# Patient Record
Sex: Female | Born: 1945 | Race: White | Hispanic: No | Marital: Single | Smoking: Former smoker
Health system: Southern US, Community
[De-identification: ages and names within clinical notes are randomized; demographics above are authoritative.]

## PROBLEM LIST (undated history)

## (undated) DIAGNOSIS — E042 Nontoxic multinodular goiter: Secondary | ICD-10-CM

## (undated) DIAGNOSIS — C801 Malignant (primary) neoplasm, unspecified: Secondary | ICD-10-CM

## (undated) DIAGNOSIS — J449 Chronic obstructive pulmonary disease, unspecified: Secondary | ICD-10-CM

## (undated) DIAGNOSIS — T148XXA Other injury of unspecified body region, initial encounter: Secondary | ICD-10-CM

## (undated) DIAGNOSIS — M1712 Unilateral primary osteoarthritis, left knee: Secondary | ICD-10-CM

## (undated) DIAGNOSIS — M1990 Unspecified osteoarthritis, unspecified site: Secondary | ICD-10-CM

## (undated) DIAGNOSIS — K219 Gastro-esophageal reflux disease without esophagitis: Secondary | ICD-10-CM

## (undated) DIAGNOSIS — C50912 Malignant neoplasm of unspecified site of left female breast: Secondary | ICD-10-CM

## (undated) DIAGNOSIS — E0590 Thyrotoxicosis, unspecified without thyrotoxic crisis or storm: Secondary | ICD-10-CM

## (undated) DIAGNOSIS — E785 Hyperlipidemia, unspecified: Secondary | ICD-10-CM

## (undated) DIAGNOSIS — Z800 Family history of malignant neoplasm of digestive organs: Secondary | ICD-10-CM

## (undated) DIAGNOSIS — R112 Nausea with vomiting, unspecified: Secondary | ICD-10-CM

## (undated) DIAGNOSIS — M7918 Myalgia, other site: Secondary | ICD-10-CM

## (undated) DIAGNOSIS — K222 Esophageal obstruction: Secondary | ICD-10-CM

## (undated) DIAGNOSIS — M810 Age-related osteoporosis without current pathological fracture: Secondary | ICD-10-CM

## (undated) DIAGNOSIS — Z9889 Other specified postprocedural states: Secondary | ICD-10-CM

## (undated) DIAGNOSIS — L309 Dermatitis, unspecified: Secondary | ICD-10-CM

## (undated) DIAGNOSIS — Z86010 Personal history of colonic polyps: Secondary | ICD-10-CM

## (undated) DIAGNOSIS — Z8719 Personal history of other diseases of the digestive system: Secondary | ICD-10-CM

## (undated) DIAGNOSIS — J479 Bronchiectasis, uncomplicated: Secondary | ICD-10-CM

## (undated) DIAGNOSIS — M8580 Other specified disorders of bone density and structure, unspecified site: Secondary | ICD-10-CM

## (undated) DIAGNOSIS — L42 Pityriasis rosea: Secondary | ICD-10-CM

## (undated) DIAGNOSIS — G4700 Insomnia, unspecified: Secondary | ICD-10-CM

## (undated) DIAGNOSIS — H269 Unspecified cataract: Secondary | ICD-10-CM

## (undated) DIAGNOSIS — K449 Diaphragmatic hernia without obstruction or gangrene: Secondary | ICD-10-CM

## (undated) DIAGNOSIS — Z803 Family history of malignant neoplasm of breast: Secondary | ICD-10-CM

## (undated) DIAGNOSIS — Z923 Personal history of irradiation: Secondary | ICD-10-CM

## (undated) DIAGNOSIS — Z973 Presence of spectacles and contact lenses: Secondary | ICD-10-CM

## (undated) DIAGNOSIS — Z8639 Personal history of other endocrine, nutritional and metabolic disease: Secondary | ICD-10-CM

## (undated) HISTORY — DX: Other specified disorders of bone density and structure, unspecified site: M85.80

## (undated) HISTORY — DX: Myalgia, other site: M79.18

## (undated) HISTORY — DX: Personal history of colonic polyps: Z86.010

## (undated) HISTORY — DX: Thyrotoxicosis, unspecified without thyrotoxic crisis or storm: E05.90

## (undated) HISTORY — DX: Unspecified cataract: H26.9

## (undated) HISTORY — DX: Other injury of unspecified body region, initial encounter: T14.8XXA

## (undated) HISTORY — DX: Family history of malignant neoplasm of breast: Z80.3

## (undated) HISTORY — DX: Gastro-esophageal reflux disease without esophagitis: K21.9

## (undated) HISTORY — DX: Pityriasis rosea: L42

## (undated) HISTORY — PX: TUBAL LIGATION: SHX77

## (undated) HISTORY — DX: Unspecified osteoarthritis, unspecified site: M19.90

## (undated) HISTORY — DX: Esophageal obstruction: K22.2

## (undated) HISTORY — DX: Bronchiectasis, uncomplicated: J47.9

## (undated) HISTORY — DX: Age-related osteoporosis without current pathological fracture: M81.0

## (undated) HISTORY — PX: TONSILLECTOMY: SUR1361

## (undated) HISTORY — DX: Unilateral primary osteoarthritis, left knee: M17.12

## (undated) HISTORY — DX: Family history of malignant neoplasm of digestive organs: Z80.0

## (undated) HISTORY — DX: Malignant (primary) neoplasm, unspecified: C80.1

## (undated) HISTORY — DX: Diaphragmatic hernia without obstruction or gangrene: K44.9

---

## 1989-10-30 HISTORY — PX: VAGINAL HYSTERECTOMY: SUR661

## 1998-07-02 ENCOUNTER — Other Ambulatory Visit: Admission: RE | Admit: 1998-07-02 | Discharge: 1998-07-02 | Payer: Self-pay | Admitting: Obstetrics and Gynecology

## 1998-10-30 HISTORY — PX: BREAST SURGERY: SHX581

## 1998-11-05 ENCOUNTER — Ambulatory Visit (HOSPITAL_BASED_OUTPATIENT_CLINIC_OR_DEPARTMENT_OTHER): Admission: RE | Admit: 1998-11-05 | Discharge: 1998-11-05 | Payer: Self-pay | Admitting: Surgery

## 1999-07-06 ENCOUNTER — Other Ambulatory Visit: Admission: RE | Admit: 1999-07-06 | Discharge: 1999-07-06 | Payer: Self-pay | Admitting: Obstetrics and Gynecology

## 1999-07-06 ENCOUNTER — Encounter (INDEPENDENT_AMBULATORY_CARE_PROVIDER_SITE_OTHER): Payer: Self-pay

## 2000-07-05 ENCOUNTER — Other Ambulatory Visit: Admission: RE | Admit: 2000-07-05 | Discharge: 2000-07-05 | Payer: Self-pay | Admitting: Obstetrics and Gynecology

## 2001-07-11 ENCOUNTER — Other Ambulatory Visit: Admission: RE | Admit: 2001-07-11 | Discharge: 2001-07-11 | Payer: Self-pay | Admitting: Obstetrics and Gynecology

## 2001-09-06 ENCOUNTER — Ambulatory Visit (HOSPITAL_COMMUNITY): Admission: RE | Admit: 2001-09-06 | Discharge: 2001-09-06 | Payer: Self-pay | Admitting: Gastroenterology

## 2001-09-06 ENCOUNTER — Encounter (INDEPENDENT_AMBULATORY_CARE_PROVIDER_SITE_OTHER): Payer: Self-pay | Admitting: *Deleted

## 2001-09-06 DIAGNOSIS — Z86010 Personal history of colonic polyps: Secondary | ICD-10-CM

## 2001-09-06 HISTORY — DX: Personal history of colonic polyps: Z86.010

## 2002-07-17 ENCOUNTER — Other Ambulatory Visit: Admission: RE | Admit: 2002-07-17 | Discharge: 2002-07-17 | Payer: Self-pay | Admitting: Obstetrics and Gynecology

## 2003-07-24 ENCOUNTER — Other Ambulatory Visit: Admission: RE | Admit: 2003-07-24 | Discharge: 2003-07-24 | Payer: Self-pay | Admitting: Obstetrics and Gynecology

## 2003-09-08 ENCOUNTER — Encounter: Admission: RE | Admit: 2003-09-08 | Discharge: 2003-09-08 | Payer: Self-pay | Admitting: Internal Medicine

## 2004-03-29 ENCOUNTER — Encounter: Admission: RE | Admit: 2004-03-29 | Discharge: 2004-03-29 | Payer: Self-pay | Admitting: Internal Medicine

## 2004-08-01 ENCOUNTER — Other Ambulatory Visit: Admission: RE | Admit: 2004-08-01 | Discharge: 2004-08-01 | Payer: Self-pay | Admitting: Obstetrics and Gynecology

## 2004-08-31 ENCOUNTER — Ambulatory Visit: Payer: Self-pay | Admitting: Gastroenterology

## 2004-09-16 ENCOUNTER — Ambulatory Visit: Payer: Self-pay | Admitting: Gastroenterology

## 2004-10-06 ENCOUNTER — Ambulatory Visit: Payer: Self-pay | Admitting: Internal Medicine

## 2004-10-07 ENCOUNTER — Encounter: Admission: RE | Admit: 2004-10-07 | Discharge: 2004-10-07 | Payer: Self-pay | Admitting: Internal Medicine

## 2004-10-11 ENCOUNTER — Ambulatory Visit: Payer: Self-pay | Admitting: Internal Medicine

## 2005-04-14 ENCOUNTER — Ambulatory Visit: Payer: Self-pay | Admitting: Gastroenterology

## 2005-08-04 ENCOUNTER — Other Ambulatory Visit: Admission: RE | Admit: 2005-08-04 | Discharge: 2005-08-04 | Payer: Self-pay | Admitting: Obstetrics and Gynecology

## 2005-12-15 ENCOUNTER — Ambulatory Visit: Payer: Self-pay | Admitting: Internal Medicine

## 2006-03-21 ENCOUNTER — Ambulatory Visit: Payer: Self-pay | Admitting: Internal Medicine

## 2006-03-23 ENCOUNTER — Ambulatory Visit: Payer: Self-pay | Admitting: Internal Medicine

## 2006-05-15 ENCOUNTER — Ambulatory Visit: Payer: Self-pay | Admitting: Gastroenterology

## 2006-07-30 DIAGNOSIS — K219 Gastro-esophageal reflux disease without esophagitis: Secondary | ICD-10-CM

## 2006-07-30 HISTORY — DX: Gastro-esophageal reflux disease without esophagitis: K21.9

## 2006-08-08 ENCOUNTER — Other Ambulatory Visit: Admission: RE | Admit: 2006-08-08 | Discharge: 2006-08-08 | Payer: Self-pay | Admitting: Obstetrics and Gynecology

## 2006-08-14 ENCOUNTER — Ambulatory Visit: Payer: Self-pay | Admitting: Gastroenterology

## 2006-08-15 ENCOUNTER — Encounter (INDEPENDENT_AMBULATORY_CARE_PROVIDER_SITE_OTHER): Payer: Self-pay | Admitting: Specialist

## 2006-08-15 ENCOUNTER — Ambulatory Visit: Payer: Self-pay | Admitting: Gastroenterology

## 2006-08-15 DIAGNOSIS — K449 Diaphragmatic hernia without obstruction or gangrene: Secondary | ICD-10-CM

## 2006-08-15 HISTORY — DX: Diaphragmatic hernia without obstruction or gangrene: K44.9

## 2006-08-21 ENCOUNTER — Ambulatory Visit: Payer: Self-pay | Admitting: Gastroenterology

## 2006-09-14 ENCOUNTER — Ambulatory Visit: Payer: Self-pay | Admitting: Gastroenterology

## 2007-04-05 ENCOUNTER — Ambulatory Visit: Payer: Self-pay | Admitting: Internal Medicine

## 2007-04-05 DIAGNOSIS — R51 Headache: Secondary | ICD-10-CM | POA: Insufficient documentation

## 2007-04-05 DIAGNOSIS — K222 Esophageal obstruction: Secondary | ICD-10-CM

## 2007-04-05 DIAGNOSIS — R519 Headache, unspecified: Secondary | ICD-10-CM | POA: Insufficient documentation

## 2007-04-05 HISTORY — DX: Esophageal obstruction: K22.2

## 2007-04-08 ENCOUNTER — Ambulatory Visit: Payer: Self-pay | Admitting: Internal Medicine

## 2007-04-08 LAB — CONVERTED CEMR LAB
BUN: 13 mg/dL (ref 6–23)
Basophils Absolute: 0 10*3/uL (ref 0.0–0.1)
Basophils Relative: 0.6 % (ref 0.0–1.0)
CO2: 29 meq/L (ref 19–32)
Calcium: 9.7 mg/dL (ref 8.4–10.5)
Chloride: 104 meq/L (ref 96–112)
Creatinine, Ser: 1 mg/dL (ref 0.4–1.2)
Eosinophils Absolute: 0.1 10*3/uL (ref 0.0–0.6)
Eosinophils Relative: 1.5 % (ref 0.0–5.0)
GFR calc Af Amer: 73 mL/min
GFR calc non Af Amer: 60 mL/min
Glucose, Bld: 102 mg/dL — ABNORMAL HIGH (ref 70–99)
HCT: 34.6 % — ABNORMAL LOW (ref 36.0–46.0)
Hemoglobin: 11.9 g/dL — ABNORMAL LOW (ref 12.0–15.0)
Lymphocytes Relative: 31.2 % (ref 12.0–46.0)
MCHC: 34.5 g/dL (ref 30.0–36.0)
MCV: 88.8 fL (ref 78.0–100.0)
Monocytes Absolute: 0.5 10*3/uL (ref 0.2–0.7)
Monocytes Relative: 8.7 % (ref 3.0–11.0)
Neutro Abs: 3.7 10*3/uL (ref 1.4–7.7)
Neutrophils Relative %: 58 % (ref 43.0–77.0)
Platelets: 269 10*3/uL (ref 150–400)
Potassium: 3.9 meq/L (ref 3.5–5.1)
RBC: 3.89 M/uL (ref 3.87–5.11)
RDW: 12.7 % (ref 11.5–14.6)
Sed Rate: 9 mm/hr (ref 0–25)
Sodium: 139 meq/L (ref 135–145)
WBC: 6.2 10*3/uL (ref 4.5–10.5)

## 2007-04-09 ENCOUNTER — Telehealth (INDEPENDENT_AMBULATORY_CARE_PROVIDER_SITE_OTHER): Payer: Self-pay | Admitting: *Deleted

## 2007-04-16 ENCOUNTER — Ambulatory Visit: Payer: Self-pay | Admitting: Internal Medicine

## 2007-06-05 ENCOUNTER — Ambulatory Visit: Payer: Self-pay | Admitting: Internal Medicine

## 2007-06-05 DIAGNOSIS — M545 Low back pain, unspecified: Secondary | ICD-10-CM | POA: Insufficient documentation

## 2007-06-05 LAB — CONVERTED CEMR LAB
Bilirubin Urine: NEGATIVE
Glucose, Urine, Semiquant: NEGATIVE
Ketones, urine, test strip: NEGATIVE
Nitrite: NEGATIVE
Protein, U semiquant: NEGATIVE
Specific Gravity, Urine: <1.005
Urobilinogen, UA: NEGATIVE
pH: 6.5

## 2007-06-06 ENCOUNTER — Encounter: Payer: Self-pay | Admitting: Internal Medicine

## 2007-06-10 ENCOUNTER — Encounter (INDEPENDENT_AMBULATORY_CARE_PROVIDER_SITE_OTHER): Payer: Self-pay | Admitting: *Deleted

## 2007-06-11 ENCOUNTER — Telehealth (INDEPENDENT_AMBULATORY_CARE_PROVIDER_SITE_OTHER): Payer: Self-pay | Admitting: *Deleted

## 2007-07-09 ENCOUNTER — Ambulatory Visit: Payer: Self-pay | Admitting: Gastroenterology

## 2007-08-12 ENCOUNTER — Other Ambulatory Visit: Admission: RE | Admit: 2007-08-12 | Discharge: 2007-08-12 | Payer: Self-pay | Admitting: Obstetrics and Gynecology

## 2008-01-16 DIAGNOSIS — M1990 Unspecified osteoarthritis, unspecified site: Secondary | ICD-10-CM

## 2008-01-16 DIAGNOSIS — D126 Benign neoplasm of colon, unspecified: Secondary | ICD-10-CM

## 2008-01-16 HISTORY — DX: Unspecified osteoarthritis, unspecified site: M19.90

## 2008-01-16 HISTORY — DX: Benign neoplasm of colon, unspecified: D12.6

## 2008-08-12 ENCOUNTER — Other Ambulatory Visit: Admission: RE | Admit: 2008-08-12 | Discharge: 2008-08-12 | Payer: Self-pay | Admitting: Obstetrics and Gynecology

## 2008-08-12 ENCOUNTER — Encounter: Payer: Self-pay | Admitting: Obstetrics and Gynecology

## 2008-08-12 ENCOUNTER — Ambulatory Visit: Payer: Self-pay | Admitting: Obstetrics and Gynecology

## 2008-09-28 ENCOUNTER — Ambulatory Visit: Payer: Self-pay | Admitting: Gastroenterology

## 2008-10-14 ENCOUNTER — Ambulatory Visit: Payer: Self-pay | Admitting: Internal Medicine

## 2008-10-14 LAB — CONVERTED CEMR LAB
Bilirubin Urine: NEGATIVE
Glucose, Urine, Semiquant: NEGATIVE
Ketones, urine, test strip: NEGATIVE
Nitrite: NEGATIVE
Protein, U semiquant: NEGATIVE
Specific Gravity, Urine: 1.01
Urobilinogen, UA: 0.2
pH: 6

## 2008-10-15 ENCOUNTER — Encounter: Payer: Self-pay | Admitting: Internal Medicine

## 2008-10-15 LAB — CONVERTED CEMR LAB

## 2008-10-16 ENCOUNTER — Encounter: Payer: Self-pay | Admitting: Internal Medicine

## 2008-10-21 ENCOUNTER — Ambulatory Visit: Payer: Self-pay | Admitting: Internal Medicine

## 2008-10-21 DIAGNOSIS — R070 Pain in throat: Secondary | ICD-10-CM | POA: Insufficient documentation

## 2008-10-21 DIAGNOSIS — R5381 Other malaise: Secondary | ICD-10-CM | POA: Insufficient documentation

## 2008-10-21 DIAGNOSIS — R5383 Other fatigue: Secondary | ICD-10-CM | POA: Insufficient documentation

## 2008-10-22 ENCOUNTER — Encounter: Payer: Self-pay | Admitting: Internal Medicine

## 2008-10-22 DIAGNOSIS — E0590 Thyrotoxicosis, unspecified without thyrotoxic crisis or storm: Secondary | ICD-10-CM | POA: Insufficient documentation

## 2008-10-22 HISTORY — DX: Thyrotoxicosis, unspecified without thyrotoxic crisis or storm: E05.90

## 2008-10-26 LAB — CONVERTED CEMR LAB
Basophils Absolute: 0 10*3/uL (ref 0.0–0.1)
Basophils Relative: 0 % (ref 0.0–3.0)
Eosinophils Absolute: 0.2 10*3/uL (ref 0.0–0.7)
Eosinophils Relative: 2.5 % (ref 0.0–5.0)
Free T4: 2 ng/dL — ABNORMAL HIGH (ref 0.6–1.6)
HCT: 33.5 % — ABNORMAL LOW (ref 36.0–46.0)
Hemoglobin: 11.5 g/dL — ABNORMAL LOW (ref 12.0–15.0)
Lymphocytes Relative: 26.8 % (ref 12.0–46.0)
MCHC: 34.2 g/dL (ref 30.0–36.0)
MCV: 88.9 fL (ref 78.0–100.0)
Monocytes Absolute: 0.4 10*3/uL (ref 0.1–1.0)
Monocytes Relative: 5.4 % (ref 3.0–12.0)
Neutro Abs: 4.8 10*3/uL (ref 1.4–7.7)
Neutrophils Relative %: 65.3 % (ref 43.0–77.0)
Platelets: 334 10*3/uL (ref 150–400)
RBC: 3.76 M/uL — ABNORMAL LOW (ref 3.87–5.11)
RDW: 11.6 % (ref 11.5–14.6)
Sed Rate: 35 mm/hr — ABNORMAL HIGH (ref 0–22)
TSH: 0.08 microintl units/mL — ABNORMAL LOW (ref 0.35–5.50)
WBC: 7.4 10*3/uL (ref 4.5–10.5)

## 2008-10-28 ENCOUNTER — Encounter: Admission: RE | Admit: 2008-10-28 | Discharge: 2008-10-28 | Payer: Self-pay | Admitting: Internal Medicine

## 2008-11-02 ENCOUNTER — Telehealth (INDEPENDENT_AMBULATORY_CARE_PROVIDER_SITE_OTHER): Payer: Self-pay | Admitting: *Deleted

## 2008-11-02 ENCOUNTER — Ambulatory Visit: Payer: Self-pay | Admitting: Internal Medicine

## 2008-11-02 DIAGNOSIS — D649 Anemia, unspecified: Secondary | ICD-10-CM | POA: Insufficient documentation

## 2008-11-02 HISTORY — DX: Anemia, unspecified: D64.9

## 2008-11-03 ENCOUNTER — Ambulatory Visit: Payer: Self-pay | Admitting: Internal Medicine

## 2008-11-03 ENCOUNTER — Encounter (INDEPENDENT_AMBULATORY_CARE_PROVIDER_SITE_OTHER): Payer: Self-pay | Admitting: *Deleted

## 2008-11-10 ENCOUNTER — Encounter (INDEPENDENT_AMBULATORY_CARE_PROVIDER_SITE_OTHER): Payer: Self-pay | Admitting: *Deleted

## 2008-11-10 LAB — CONVERTED CEMR LAB
BUN: 22 mg/dL (ref 6–23)
Basophils Absolute: 0 10*3/uL (ref 0.0–0.1)
Basophils Relative: 0.7 % (ref 0.0–3.0)
CO2: 24 meq/L (ref 19–32)
Calcium: 9.4 mg/dL (ref 8.4–10.5)
Chloride: 101 meq/L (ref 96–112)
Creatinine, Ser: 0.9 mg/dL (ref 0.4–1.2)
Eosinophils Absolute: 0.3 10*3/uL (ref 0.0–0.7)
Eosinophils Relative: 4.4 % (ref 0.0–5.0)
Folate: 16.6 ng/mL
Free T4: 3.1 ng/dL — ABNORMAL HIGH (ref 0.6–1.6)
GFR calc Af Amer: 82 mL/min
GFR calc non Af Amer: 68 mL/min
Glucose, Bld: 95 mg/dL (ref 70–99)
HCT: 33.6 % — ABNORMAL LOW (ref 36.0–46.0)
Hemoglobin: 11.6 g/dL — ABNORMAL LOW (ref 12.0–15.0)
Iron: 45 ug/dL (ref 42–145)
Lymphocytes Relative: 24.6 % (ref 12.0–46.0)
MCHC: 34.5 g/dL (ref 30.0–36.0)
MCV: 88.3 fL (ref 78.0–100.0)
Monocytes Absolute: 0.9 10*3/uL (ref 0.1–1.0)
Monocytes Relative: 12.9 % — ABNORMAL HIGH (ref 3.0–12.0)
Neutro Abs: 3.9 10*3/uL (ref 1.4–7.7)
Neutrophils Relative %: 57.4 % (ref 43.0–77.0)
Platelets: 361 10*3/uL (ref 150–400)
Potassium: 4.1 meq/L (ref 3.5–5.1)
RBC: 3.8 M/uL — ABNORMAL LOW (ref 3.87–5.11)
RDW: 11.9 % (ref 11.5–14.6)
Sodium: 136 meq/L (ref 135–145)
T3, Free: 6.8 pg/mL — ABNORMAL HIGH (ref 2.3–4.2)
TSH: 0.04 microintl units/mL — ABNORMAL LOW (ref 0.35–5.50)
Vitamin B-12: 238 pg/mL (ref 211–911)
WBC: 6.7 10*3/uL (ref 4.5–10.5)

## 2008-11-27 ENCOUNTER — Ambulatory Visit: Payer: Self-pay | Admitting: Endocrinology

## 2008-11-27 DIAGNOSIS — E042 Nontoxic multinodular goiter: Secondary | ICD-10-CM

## 2008-11-27 HISTORY — DX: Nontoxic multinodular goiter: E04.2

## 2008-11-27 LAB — CONVERTED CEMR LAB
Free T4: 0.9 ng/dL (ref 0.6–1.6)
TSH: 0.07 microintl units/mL — ABNORMAL LOW (ref 0.35–5.50)

## 2008-12-02 ENCOUNTER — Ambulatory Visit: Payer: Self-pay | Admitting: Internal Medicine

## 2009-04-22 ENCOUNTER — Ambulatory Visit: Payer: Self-pay | Admitting: Endocrinology

## 2009-04-22 LAB — CONVERTED CEMR LAB
Free T4: 0.9 ng/dL (ref 0.6–1.6)
TSH: 4.43 microintl units/mL (ref 0.35–5.50)

## 2009-05-11 ENCOUNTER — Ambulatory Visit: Payer: Self-pay | Admitting: Gastroenterology

## 2009-06-30 ENCOUNTER — Ambulatory Visit: Payer: Self-pay | Admitting: Obstetrics and Gynecology

## 2009-06-30 ENCOUNTER — Ambulatory Visit: Payer: Self-pay | Admitting: Internal Medicine

## 2009-07-06 ENCOUNTER — Encounter (INDEPENDENT_AMBULATORY_CARE_PROVIDER_SITE_OTHER): Payer: Self-pay | Admitting: *Deleted

## 2009-07-06 LAB — CONVERTED CEMR LAB
Basophils Absolute: 0 10*3/uL (ref 0.0–0.1)
Basophils Relative: 1 % (ref 0.0–3.0)
Eosinophils Absolute: 0.2 10*3/uL (ref 0.0–0.7)
Eosinophils Relative: 3.3 % (ref 0.0–5.0)
HCT: 37.7 % (ref 36.0–46.0)
Hemoglobin: 12.8 g/dL (ref 12.0–15.0)
Iron: 60 ug/dL (ref 42–145)
Lymphocytes Relative: 38.4 % (ref 12.0–46.0)
Lymphs Abs: 1.8 10*3/uL (ref 0.7–4.0)
MCHC: 34 g/dL (ref 30.0–36.0)
MCV: 92.5 fL (ref 78.0–100.0)
Monocytes Absolute: 0.5 10*3/uL (ref 0.1–1.0)
Monocytes Relative: 9.7 % (ref 3.0–12.0)
Neutro Abs: 2.2 10*3/uL (ref 1.4–7.7)
Neutrophils Relative %: 47.6 % (ref 43.0–77.0)
Platelets: 238 10*3/uL (ref 150.0–400.0)
RBC: 4.07 M/uL (ref 3.87–5.11)
RDW: 12 % (ref 11.5–14.6)
Saturation Ratios: 14.6 % — ABNORMAL LOW (ref 20.0–50.0)
Transferrin: 292.7 mg/dL (ref 212.0–360.0)
WBC: 4.7 10*3/uL (ref 4.5–10.5)

## 2009-08-02 ENCOUNTER — Encounter (INDEPENDENT_AMBULATORY_CARE_PROVIDER_SITE_OTHER): Payer: Self-pay | Admitting: *Deleted

## 2009-08-17 ENCOUNTER — Encounter: Payer: Self-pay | Admitting: Endocrinology

## 2009-08-17 ENCOUNTER — Other Ambulatory Visit: Admission: RE | Admit: 2009-08-17 | Discharge: 2009-08-17 | Payer: Self-pay | Admitting: Obstetrics and Gynecology

## 2009-08-17 ENCOUNTER — Ambulatory Visit: Payer: Self-pay | Admitting: Obstetrics and Gynecology

## 2009-08-17 ENCOUNTER — Encounter: Payer: Self-pay | Admitting: Obstetrics and Gynecology

## 2009-08-23 ENCOUNTER — Telehealth (INDEPENDENT_AMBULATORY_CARE_PROVIDER_SITE_OTHER): Payer: Self-pay | Admitting: *Deleted

## 2009-08-30 ENCOUNTER — Ambulatory Visit: Payer: Self-pay | Admitting: Endocrinology

## 2009-09-08 ENCOUNTER — Ambulatory Visit: Payer: Self-pay | Admitting: Obstetrics and Gynecology

## 2009-09-20 ENCOUNTER — Encounter (INDEPENDENT_AMBULATORY_CARE_PROVIDER_SITE_OTHER): Payer: Self-pay | Admitting: *Deleted

## 2009-09-21 ENCOUNTER — Ambulatory Visit: Payer: Self-pay | Admitting: Gastroenterology

## 2009-10-13 ENCOUNTER — Ambulatory Visit: Payer: Self-pay | Admitting: Gastroenterology

## 2010-03-17 ENCOUNTER — Ambulatory Visit: Payer: Self-pay | Admitting: Endocrinology

## 2010-03-17 LAB — CONVERTED CEMR LAB: TSH: 4.43 microintl units/mL (ref 0.35–5.50)

## 2010-06-28 ENCOUNTER — Encounter: Payer: Self-pay | Admitting: Internal Medicine

## 2010-07-30 LAB — CONVERTED CEMR LAB: Pap Smear: NORMAL

## 2010-08-18 ENCOUNTER — Other Ambulatory Visit: Admission: RE | Admit: 2010-08-18 | Discharge: 2010-08-18 | Payer: Self-pay | Admitting: Obstetrics and Gynecology

## 2010-08-18 ENCOUNTER — Ambulatory Visit: Payer: Self-pay | Admitting: Obstetrics and Gynecology

## 2010-08-19 ENCOUNTER — Encounter: Payer: Self-pay | Admitting: Endocrinology

## 2010-08-19 ENCOUNTER — Ambulatory Visit: Payer: Self-pay | Admitting: Obstetrics and Gynecology

## 2010-10-03 DIAGNOSIS — E7800 Pure hypercholesterolemia, unspecified: Secondary | ICD-10-CM | POA: Insufficient documentation

## 2010-10-03 HISTORY — DX: Pure hypercholesterolemia, unspecified: E78.00

## 2010-10-13 ENCOUNTER — Ambulatory Visit: Payer: Self-pay | Admitting: Endocrinology

## 2010-11-29 NOTE — Assessment & Plan Note (Signed)
Summary: FU Natale Milch  #   Vital Signs:  Patient profile:   65 year old female Height:      65.5 inches (166.37 cm) Weight:      153.50 pounds (69.77 kg) BMI:     25.25 O2 Sat:      98 % on Room air Temp:     98.1 degrees F (36.72 degrees C) oral Pulse rate:   67 / minute BP sitting:   110 / 74  (left arm) Cuff size:   regular  Vitals Entered By: Josph Macho RMA (Mar 17, 2010 8:22 AM)  O2 Flow:  Room air CC: Follow-up visit/ CF   Referring Provider:  DR. Alwyn Ren Primary Provider:  Willow Ora, MD  CC:  Follow-up visit/ CF.  History of Present Illness: pt has h/o hyperthyroidism due to a multinodular goiter.  she has been off tapazole x 1 year.  since then, she feels no different.  she finds it difficult to care for her elderly mother.  Current Medications (verified): 1)  Nexium 40 Mg Cpdr (Esomeprazole Magnesium) .... Take One By Mouth Two Times A Day 2)  Vitamin D 2000 Unit Tabs (Cholecalciferol) .... Take 1 Tablet By Mouth Two Times A Day  Allergies (verified): 1)  ! Propranolol Hcl (Propranolol Hcl)  Past History:  Past Medical History: Last updated: 06/30/2009 GERD, HIATAL HERNIA ,ESOPHAGEAL STRICTURE --(EGD 2005 and 10-07 GERD/stricture) COLONIC POLYPS, BENIGN DJD LOW BACK PAIN SYNDROME  SYMPTOM, HEADACHE  Hyperthyroidism, goiter Sees Dr Everardo All On ASA due to FH of strokes   Social History: Reviewed history from 09/28/2008 and no changes required. 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  Alcohol Use - yes Illicit Drug Use - no  Review of Systems  The patient denies weight loss and weight gain.    Physical Exam  General:  normal appearance.   Neck:  i am not sure i can apreciate a goiter Additional Exam:  FastTSH                   4.43 uIU/mL    Impression & Recommendations:  Problem # 1:  GOITER, MULTINODULAR (ICD-241.1)  Problem # 2:  HYPERTHYROIDISM (ICD-242.90) hasn't yet recurred off the  tapazole  Other Orders: TLB-TSH (Thyroid Stimulating Hormone) (84443-TSH) Est. Patient Level III (76160)  Patient Instructions: 1)  blood tests are being ordered for you today.  please call [phone] to hear your test results. 2)  if you agree, (when your thyroid become overactive), our plan will be to check "scan" (a special but easy type of thyroid x-ray), with a plan to treat the thyroid with a radioactive iodine pill. 3)  (update: i left message on phone-tree:  stay-off tapazole.  ret 6 mos)

## 2010-12-01 NOTE — Assessment & Plan Note (Signed)
Summary: FU PER PT D/T---STC   Vital Signs:  Patient profile:   65 year old female Height:      65.5 inches (166.37 cm) Weight:      154.25 pounds (70.11 kg) BMI:     25.37 O2 Sat:      98 % on Room air Temp:     98.4 degrees F (36.89 degrees C) oral Pulse rate:   69 / minute Pulse rhythm:   regular BP sitting:   102 / 64  (left arm) Cuff size:   regular  Vitals Entered By: Brenton Grills CMA Duncan Dull) (October 13, 2010 9:24 AM)  O2 Flow:  Room air CC: F/U on thyroid/aj Is Patient Diabetic? No   Referring Gaige Fussner:  DR. Alwyn Ren Primary Hansini Clodfelter:  Willow Ora, MD  CC:  F/U on thyroid/aj.  History of Present Illness: pt has h/o hyperthyroidism due to a multinodular goiter.  she does not notice the goiter.  she has been off tapazole x 18 months.  since then, she feels no different.  she finds it difficult to care for her elderly mother.  Current Medications (verified): 1)  Nexium 40 Mg Cpdr (Esomeprazole Magnesium) .... Take One By Mouth Two Times A Day 2)  Vitamin D 2000 Unit Tabs (Cholecalciferol) .... Take 1 Tablet By Mouth Two Times A Day  Allergies (verified): 1)  ! Propranolol Hcl (Propranolol Hcl)  Past History:  Past Medical History: Last updated: 06/30/2009 GERD, HIATAL HERNIA ,ESOPHAGEAL STRICTURE --(EGD 2005 and 10-07 GERD/stricture) COLONIC POLYPS, BENIGN DJD LOW BACK PAIN SYNDROME  SYMPTOM, HEADACHE  Hyperthyroidism, goiter Sees Dr Everardo All On ASA due to FH of strokes   Social History: Reviewed history from 09/28/2008 and no changes required. 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  Alcohol Use - yes Illicit Drug Use - no  Physical Exam  Neck:  i am not sure i can apreciate a goiter Additional Exam:  outside test results are reviewed:  (oct, 2011) tsh=4.67   Impression & Recommendations:  Problem # 1:  GOITER, MULTINODULAR (ICD-241.1) Assessment Unchanged  Problem # 2:  HYPERTHYROIDISM  (ICD-242.90) has not yet recurred off the tapazole TSH: 4.43 (03/17/2010)     Other Orders: Est. Patient Level III (60454)  Patient Instructions: 1)  no treatment is needed for the thyroid now 2)  Please schedule a follow-up appointment in 1 year.   Orders Added: 1)  Est. Patient Level III [09811]   Immunization History:  Influenza Immunization History:    Influenza:  historical (07/30/2010)   Immunization History:  Influenza Immunization History:    Influenza:  Historical (07/30/2010)   Preventive Care Screening  Pap Smear:    Date:  07/30/2010    Results:  normal   Mammogram:    Date:  06/30/2010    Results:  normal

## 2011-03-17 NOTE — Assessment & Plan Note (Signed)
[facility]                           GASTROENTEROLOGY OFFICE NOTE   NAME:Myler, AMALI [facility]                        MRN:          [id number]  DATE:09/14/2006                            DOB:          [birth year]    Lailah is completely asymptomatic since her endoscopy and esophageal  dilatation.  She says it is almost miraculous how much better she is doing  symptomatically.  I suspect she was having esophageal spasm secondary to  acid reflux.  Her biopsies showed no evidence of Barrett's mucosa and mild  inflammation.   I have reviewed a strict antireflux regimen with her, and will continue  Nexium 1-2 times a day as needed to control her symptomatology.  We will see  her on a p.r.n. basis as needed.     Vania Rea. Jarold Motto, MD, Caleen Essex, FAGA  Electronically Signed    DRP/MedQ  DD: 09/14/2006  DT: 09/14/2006  Job #: [id number]

## 2011-03-17 NOTE — Assessment & Plan Note (Signed)
[facility]                           GASTROENTEROLOGY OFFICE NOTE   NAME:Cynthia Thornton                        MRN:          [id number]  DATE:08/14/2006                            DOB:          06/19/1946    Cynthia Thornton had been doing well with her acid reflux on Nexium twice a day.  Over  the last 2 weeks, she has had a burning pain in her substernal area and in  her epigastric area radiating to her lower abdomen, made worse by eating  with some early satiety and nausea.  She has had no dysphagia or  odynophagia, denies recent antibiotic or NSAID use.  She denies any coronary  symptomatology or chest pain with exertion, any palpitations or symptoms of  CHF.  She has apparently had a negative previous cardiac workup, the extent  of which is unclear.  She also had an ultrasound of her abdomen a year and  one-half ago that was unremarkable.  She denies clay-colored stools, dark  maroon, icterus, fever, or chills,  She has had no systemic complaints.  She  has no history of hyperlipidemia, hypertension, family history of  cardiovascular disease.  She denies cough, sputum production, shortness of  breath, worsening pain with deep inspiration, etc.   PHYSICAL EXAMINATION:  GENERAL: She is an attractive, healthy-appearing  Cynthia Thornton female in no distress, appearing her stated age.  VITAL SIGNS: She weighs 147 pounds.  Blood pressure is 112/62.  Her pulse  was 64 and regular.  SKIN: Could not appreciate stigmata of chronic liver disease.  CHEST: Entirely clear to percussion and auscultation.  CARDIAC: No murmurs, gallops, or rubs. She was in a regular rhythm.  ABDOMEN:  I could not appreciate hepatosplenomegaly, abdominal masses, or  tenderness.  Bowel sounds were normal.  EXTREMITIES:  Unremarkable.  NEUROLOGIC:  Mental status was clear.   ASSESSMENT:  Cynthia Thornton has symptoms consistent with esophageal spasm.  It is  unlikely that this would be associated with acid  reflux on twice-a-day  Nexium.  Another consideration would be cholelithiasis.  Also a  consideration would be some form of acute gastroparesis exacerbating her  acid reflux.   RECOMMENDATIONS:  1. Outpatient endoscopy and ultrasound as soon as possible.  2. Screening laboratory parameters.  3. Continue twice-a-day Nexium with p.r.n. Levsin use.       Cynthia Thornton. Cynthia Motto, MD, Clementeen Graham, [place]      DRP/MedQ  DD:  08/14/2006  DT:  08/15/2006  Job #:  [id number]   cc:   Titus Dubin. Alwyn Ren, MD,FACP,FCCP

## 2011-03-17 NOTE — Procedures (Signed)
[facility]  Patient:    Cynthia Thornton, Cynthia Thornton Visit Number: [id number] MRN: [id number]          Service Type: END Location: ENDO Attending Physician:  Nelda Marseille Dictated by:   Petra Kuba, M.D. Proc. Date: 09/06/01 Admit Date:  09/06/2001 Discharge Date: 09/06/2001   CC:         Reuel Boom L. Eda Paschal, M.D.   Procedure Report  PROCEDURE:  Colonoscopy.  ENDOSCOPIST:  Petra Kuba, M.D.  INDICATIONS:  Patient with anemia, due for colonic screening.  INFORMED CONSENT:  Consent was signed after risks, benefits, methods, options were thoroughly discussed in the office.  MEDICATIONS USED:  Demerol 70 mg, Versed 7 mg.  DESCRIPTION OF PROCEDURE:  Rectal inspection was pertinent for external hemorrhoids.  Digital exam was negative.  The video pediatric adjustable colonoscope was inserted and fairly easily despite a tortuous colon advanced to the cecum.  This did require some abdominal pressure but no position changes.  On insertion in the more distal ascending colon a 2-3 mm polyp was seen and was hot biopsied x 1.  The scope was then advanced to the cecum.  No abnormalities were seen on insertion.  The cecum was identified by the appendiceal orifice and the ileocecal valve.  In fact, the scope was inserted a short way into the terminal ileum which was normal.  Photodocumentation was obtained.  The scope was slowly withdrawn. The prep was adequate.  There was some liquid stool that required washing and suctioning.  In the more proximal ascending approximately another 2 mm polyp was seen and was hot biopsied x 1.  The scope was slowly withdrawn back to the polyp previously seen, and another hot biopsy of it was obtained.  The scope was then further withdrawn.  Other than a tiny midsigmoid questionable polyp which was hot biopsied and put in a second container, no other abnormalities were seen.  We did follow back around one of her tortuous loops  and we did try to advance to decrease the chance of missing things behind folds.  The scope was slowly withdrawn back to the rectum and retroflexed, pertinent for some small internal hemorrhoids.  The scope was straightened and advanced a short way up the left side of the colon. Air was suctioned and scope removed.  The patient tolerated the procedure well.  There was no obvious immediate complication.  ENDOSCOPIC DIAGNOSES: 1. Internal and external hemorrhoids. 2. Tiny sigmoid mid polyp hot biopsied. 3. Two descending small polyps hot biopsied. 4. Some dark material in the cecum and right side of the colon suctioned,    guaiac-negative. 5. Otherwise within normal limits to the terminal ileum except for some    tortuosity.  PLAN:  Await pathology to determine future colonic screening.  Happy to see p.r.n.  Otherwise return care to Dr. Eda Paschal for the customary health care maintenance to include yearly rectals and guaiacs.  We will put her on no aspirin or nonsteroidals restrictions for one week based on hot biopsies. Dictated by:   Petra Kuba, M.D. Attending Physician:  Nelda Marseille DD:  09/06/01 TD:  09/09/01 Job: [id number]

## 2011-07-12 ENCOUNTER — Other Ambulatory Visit: Payer: Self-pay | Admitting: *Deleted

## 2011-07-12 DIAGNOSIS — N63 Unspecified lump in unspecified breast: Secondary | ICD-10-CM

## 2011-07-24 ENCOUNTER — Other Ambulatory Visit: Payer: Self-pay | Admitting: Obstetrics and Gynecology

## 2011-07-24 DIAGNOSIS — N63 Unspecified lump in unspecified breast: Secondary | ICD-10-CM

## 2011-08-21 ENCOUNTER — Encounter: Payer: Self-pay | Admitting: Obstetrics and Gynecology

## 2011-08-25 ENCOUNTER — Encounter: Payer: Self-pay | Admitting: Gynecology

## 2011-08-25 DIAGNOSIS — M8580 Other specified disorders of bone density and structure, unspecified site: Secondary | ICD-10-CM | POA: Insufficient documentation

## 2011-08-25 DIAGNOSIS — N938 Other specified abnormal uterine and vaginal bleeding: Secondary | ICD-10-CM | POA: Insufficient documentation

## 2011-08-31 ENCOUNTER — Ambulatory Visit (INDEPENDENT_AMBULATORY_CARE_PROVIDER_SITE_OTHER): Payer: BC Managed Care – PPO | Admitting: Obstetrics and Gynecology

## 2011-08-31 ENCOUNTER — Other Ambulatory Visit (HOSPITAL_COMMUNITY)
Admission: RE | Admit: 2011-08-31 | Discharge: 2011-08-31 | Disposition: A | Discharge status: Home / Self Care | Payer: BC Managed Care – PPO | Source: Ambulatory Visit | Attending: Obstetrics and Gynecology | Admitting: Obstetrics and Gynecology

## 2011-08-31 ENCOUNTER — Encounter: Payer: Self-pay | Admitting: Obstetrics and Gynecology

## 2011-08-31 VITALS — BP 120/74 | Ht 65.0 in | Wt 148.0 lb

## 2011-08-31 DIAGNOSIS — Z01419 Encounter for gynecological examination (general) (routine) without abnormal findings: Secondary | ICD-10-CM | POA: Insufficient documentation

## 2011-08-31 DIAGNOSIS — Z23 Encounter for immunization: Secondary | ICD-10-CM

## 2011-08-31 DIAGNOSIS — Z833 Family history of diabetes mellitus: Secondary | ICD-10-CM

## 2011-08-31 DIAGNOSIS — E0590 Thyrotoxicosis, unspecified without thyrotoxic crisis or storm: Secondary | ICD-10-CM

## 2011-08-31 DIAGNOSIS — R823 Hemoglobinuria: Secondary | ICD-10-CM

## 2011-08-31 DIAGNOSIS — I10 Essential (primary) hypertension: Secondary | ICD-10-CM

## 2011-08-31 LAB — T4: T4, Total: 8.5 ug/dL (ref 5.0–12.5)

## 2011-08-31 LAB — T3 UPTAKE: T3 Uptake: 33.8 % (ref 22.5–37.0)

## 2011-08-31 NOTE — Progress Notes (Signed)
Patient came to see me today for her annual GYN exam. She continues with yearly mammograms. She has scheduled a followup bone density because of osteopenia. She is having no vaginal bleeding and no pelvic pain. She wanted me to check all her thyroid function before she went to see Dr. Everardo All. She is doing well otherwise.  HEENT: Within normal limits. Kennon Portela present. Neck: No masses. Supraclavicular lymph nodes: Not enlarged. Breasts: Examined in both sitting and lying position. Symmetrical without skin changes or masses. Abdomen: Soft no masses guarding or rebound. No hernias. Pelvic: External within normal limits. BUS within normal limits. Vaginal examination shows good estrogen effect, no cystocele enterocele or rectocele. Cervix and uterus absent. Adnexa within normal limits. Rectovaginal confirmatory. Extremities within normal limits.  Assessment: #1. Hyperthyroidism now euthyroid.  Plan: T4, T3, TSH ordered. Continue yearly mammograms. Followup bone density. Tranxene 7.5 mg #30 with 6 refills.

## 2011-09-06 ENCOUNTER — Other Ambulatory Visit: Payer: Self-pay | Admitting: Obstetrics and Gynecology

## 2011-09-06 DIAGNOSIS — M8580 Other specified disorders of bone density and structure, unspecified site: Secondary | ICD-10-CM

## 2011-09-12 ENCOUNTER — Ambulatory Visit (INDEPENDENT_AMBULATORY_CARE_PROVIDER_SITE_OTHER): Payer: BC Managed Care – PPO

## 2011-09-12 DIAGNOSIS — M899 Disorder of bone, unspecified: Secondary | ICD-10-CM

## 2011-09-12 DIAGNOSIS — M8580 Other specified disorders of bone density and structure, unspecified site: Secondary | ICD-10-CM

## 2011-09-15 ENCOUNTER — Encounter: Payer: Self-pay | Admitting: Endocrinology

## 2011-09-15 ENCOUNTER — Ambulatory Visit (INDEPENDENT_AMBULATORY_CARE_PROVIDER_SITE_OTHER): Payer: BC Managed Care – PPO | Admitting: Endocrinology

## 2011-09-15 DIAGNOSIS — E0590 Thyrotoxicosis, unspecified without thyrotoxic crisis or storm: Secondary | ICD-10-CM

## 2011-09-15 NOTE — Progress Notes (Signed)
  Subjective:    Patient ID: Cynthia Thornton, female    DOB: 08-19-1946, 65 y.o.   MRN: [id number]  HPI pt has h/o hyperthyroidism due to a multinodular goiter.  She was given thionamide rx for a while in 2010, but she went off due to normal tft, and hasn't needed any since.  she does not notice the goiter.  since then, she feels no different, and well in general.   Past Medical History  Diagnosis Date  . Osteopenia   . DUB (dysfunctional uterine bleeding)   . Arthritis   . Hiatal hernia   . Acid reflux   . Thyroid disease     Hyperthyroid    Past Surgical History  Procedure Date  . Vaginal hysterectomy 1991    Right ovarian cystectomy  . Tonsillectomy   . Breast surgery 2000    Breast cyst removed    History   Social History  . Marital Status: Divorced    Spouse Name: N/A    Number of Children: N/A  . Years of Education: N/A   Occupational History  . Not on file.   Social History Main Topics  . Smoking status: Former Games developer  . Smokeless tobacco: Not on file  . Alcohol Use: No     Rare  . Drug Use: Not on file  . Sexually Active: No   Other Topics Concern  . Not on file   Social History Narrative  . No narrative on file    Current Outpatient Prescriptions on File Prior to Visit  Medication Sig Dispense Refill  . b complex vitamins capsule Take 1 capsule by mouth daily.        . betamethasone, augmented, (DIPROLENE) 0.05 % lotion Apply topically as needed.        . Cholecalciferol (VITAMIN D PO) Take 2,000 Units by mouth.        . Clorazepate Dipotassium (TRANXENE-SD PO) Take 7.5 mg by mouth as needed.       Marland Kitchen esomeprazole (NEXIUM) 40 MG capsule Take 40 mg by mouth daily.        . promethazine (PHENERGAN) 25 MG suppository Place 25 mg rectally every 6 (six) hours as needed.          Allergies  Allergen Reactions  . Codeine Nausea Only  . Propranolol Hcl     REACTION: DIZZY    Family History  Problem Relation Age of Onset  . Osteoporosis Mother   .  Lung cancer Mother   . Diabetes Father   . Hypertension Father   . Osteoporosis Father   . Colon cancer Father   . Breast cancer Sister   . Ovarian cancer Maternal Aunt   . Breast cancer Cousin     Maternal cousin    BP 126/72  Pulse 61  Temp(Src) 98.6 F (37 C) (Oral)  Ht 5\' 5"  (1.651 m)  Wt 152 lb (68.947 kg)  BMI 25.29 kg/m2  SpO2 96%  Review of Systems Denies weight change    Objective:   Physical Exam VITAL SIGNS:  See vs page GENERAL: no distress NECK: There is no palpable thyroid enlargement.  No thyroid nodule is palpable.  No palpable lymphadenopathy at the anterior neck.   Lab Results  Component Value Date   TSH 4.446 08/31/2011   T4TOTAL 8.5 08/31/2011      Assessment & Plan:  H/o hyperthyroidism, resolved.  However, she has a high risk if developing abnormal thyroid function over time

## 2011-09-15 NOTE — Patient Instructions (Signed)
With time, your thyroid will probably go back overactive, or become underactive.   It is fine with me, for dr Drue Novel or gottsagen to check your thyroid blood test each year.  Please come back if it becomes abnormal.

## 2011-11-01 ENCOUNTER — Other Ambulatory Visit: Payer: Self-pay | Admitting: Gastroenterology

## 2011-11-09 ENCOUNTER — Other Ambulatory Visit: Payer: Self-pay | Admitting: Gastroenterology

## 2012-01-17 ENCOUNTER — Other Ambulatory Visit: Payer: Self-pay | Admitting: Gastroenterology

## 2012-01-21 ENCOUNTER — Other Ambulatory Visit: Payer: Self-pay | Admitting: Gastroenterology

## 2012-01-24 ENCOUNTER — Telehealth: Payer: Self-pay | Admitting: Gastroenterology

## 2012-01-24 NOTE — Telephone Encounter (Signed)
Left message on machine to call back  

## 2012-01-25 MED ORDER — ESOMEPRAZOLE MAGNESIUM 40 MG PO CPDR: DELAYED_RELEASE_CAPSULE | ORAL | Status: DC

## 2012-01-31 ENCOUNTER — Encounter: Payer: Self-pay | Admitting: *Deleted

## 2012-02-06 ENCOUNTER — Ambulatory Visit (INDEPENDENT_AMBULATORY_CARE_PROVIDER_SITE_OTHER): Payer: BC Managed Care – PPO | Admitting: Gastroenterology

## 2012-02-06 ENCOUNTER — Encounter: Payer: Self-pay | Admitting: Gastroenterology

## 2012-02-06 VITALS — BP 110/70 | HR 72 | Ht 65.0 in | Wt 152.0 lb

## 2012-02-06 DIAGNOSIS — Z800 Family history of malignant neoplasm of digestive organs: Secondary | ICD-10-CM

## 2012-02-06 DIAGNOSIS — K219 Gastro-esophageal reflux disease without esophagitis: Secondary | ICD-10-CM

## 2012-02-06 MED ORDER — ESOMEPRAZOLE MAGNESIUM 40 MG PO CPDR: DELAYED_RELEASE_CAPSULE | ORAL | Status: DC

## 2012-02-06 NOTE — Progress Notes (Signed)
This is a extremely nice 66 year old Caucasian female with a previous history of thyroid goiter and hyperthyroidism followed by Dr. Everardo All. I've seen her because of chronic acid reflux, and she does well on Nexium 40 mg a day without reflux or dysphagia. She has a very strong family history of colon cancer in multiple first degree relatives, has had adenomatous colon polyps excised, and is in our followup protocol. She currently denies rectal bleeding, lower abdominal pain, or bowel irregularity. Her appetite is good and her weight is stable. She is followed gynecologically by Dr. Edyth Gunnels, and apparently has had normal laboratory parameters.  Current Medications, Allergies, Past Medical History, Past Surgical History, Family History and Social History were reviewed in [employer] record.  Pertinent Review of Systems Negative... no history of Raynaud's phenomenon or collagen vascular disease, cardiovascular or pulmonary complaints.   Physical Exam: Healthy patient no distress. Blood pressure 110/70, pulse 72 and regular, and BMI 25.29. I cannot appreciate stigmata of chronic liver disease. Chest is clear and she is in a regular rhythm without murmurs gallops or rubs. There is no organomegaly, abdominal masses or tenderness. Bowel sounds are normal. Peripheral extremities are unremarkable mental status is normal.    Assessment and Plan: Chronic GERD well controlled on daily PPI therapy which she occasionally has to use twice a day. However due to her Nexium and reviewed a reflux regime with her. We will continue colonoscopy followup as per clinical protocol. She is to continue other medications as per primary care and GYN. I have asked her to take Caltrate 600 with vitamin D 2 tablets a day for her postmenopausal state and chronic PPI use. No diagnosis found.

## 2012-02-06 NOTE — Patient Instructions (Addendum)
Your prescription(s) have been sent to you pharmacy.  You will receive a letter about your colonoscopy recall due in 2015. Buy Caltrate 600 with Vit D OTC and take one tablet twice a day.

## 2012-05-09 ENCOUNTER — Encounter: Payer: Self-pay | Admitting: Endocrinology

## 2012-05-09 ENCOUNTER — Other Ambulatory Visit (INDEPENDENT_AMBULATORY_CARE_PROVIDER_SITE_OTHER): Payer: BC Managed Care – PPO

## 2012-05-09 ENCOUNTER — Ambulatory Visit (INDEPENDENT_AMBULATORY_CARE_PROVIDER_SITE_OTHER): Payer: BC Managed Care – PPO | Admitting: Endocrinology

## 2012-05-09 VITALS — BP 112/78 | HR 73 | Temp 97.8°F | Ht 65.0 in | Wt 152.0 lb

## 2012-05-09 DIAGNOSIS — R059 Cough, unspecified: Secondary | ICD-10-CM

## 2012-05-09 DIAGNOSIS — E042 Nontoxic multinodular goiter: Secondary | ICD-10-CM

## 2012-05-09 DIAGNOSIS — E0590 Thyrotoxicosis, unspecified without thyrotoxic crisis or storm: Secondary | ICD-10-CM

## 2012-05-09 DIAGNOSIS — R05 Cough: Secondary | ICD-10-CM

## 2012-05-09 LAB — TSH: TSH: 2.77 u[IU]/mL (ref 0.35–5.50)

## 2012-05-09 NOTE — Progress Notes (Signed)
Subjective:    Patient ID: Cynthia Thornton, female    DOB: 12/27/1945, 66 y.o.   MRN: [id number]  HPI pt has h/o hyperthyroidism due to a multinodular goiter.  She was given thionamide rx for a while in 2010, but she went off due to normal tft, and hasn't needed any since.  she does not notice the goiter.  Pt states few weeks of moderate dry-quality cough in the neck.   Past Medical History  Diagnosis Date  . Osteopenia   . DUB (dysfunctional uterine bleeding)   . Arthritis   . Hiatal hernia   . Esophageal reflux   . Hyperthyroidism   . Esophageal stricture   . Personal history of colonic polyps 09/06/2001    hyperplastic    Past Surgical History  Procedure Date  . Vaginal hysterectomy 1991    Right ovarian cystectomy  . Tonsillectomy   . Breast surgery 2000    Breast cyst removed, left    History   Social History  . Marital Status: Divorced    Spouse Name: N/A    Number of Children: 2  . Years of Education: N/A   Occupational History  . teacher [employer]  . retired Scientist, research (life sciences)    Social History Main Topics  . Smoking status: Former Smoker    Types: Cigarettes  . Smokeless tobacco: Never Used  . Alcohol Use: 0.0 oz/week     Rare  . Drug Use: No  . Sexually Active: No   Other Topics Concern  . Not on file   Social History Narrative  . No narrative on file    Current Outpatient Prescriptions on File Prior to Visit  Medication Sig Dispense Refill  . b complex vitamins capsule Take 1 capsule by mouth daily.        . betamethasone, augmented, (DIPROLENE) 0.05 % lotion Apply topically as needed.        . Cholecalciferol (VITAMIN D PO) Take 2,000 Units by mouth.        . Clorazepate Dipotassium (TRANXENE-SD PO) Take 7.5 mg by mouth as needed.       Marland Kitchen esomeprazole (NEXIUM) 40 MG capsule Take one tablet by mouth once a day  30 capsule  11  . promethazine (PHENERGAN) 25 MG suppository Place 25 mg rectally every 6 (six) hours as needed.           Allergies  Allergen Reactions  . Codeine Nausea Only  . Propranolol Hcl     REACTION: DIZZY    Family History  Problem Relation Age of Onset  . Osteoporosis Mother   . Lung cancer Mother   . Diabetes Father   . Hypertension Father   . Osteoporosis Father   . Colon cancer Father 35  . Breast cancer Sister   . Ovarian cancer Maternal Aunt     great  . Breast cancer Cousin     maternal  . Breast cancer Maternal Aunt     great  . Colon cancer Paternal Uncle 58  . Colon cancer Cousin     maternal  . Ovarian cancer Maternal Aunt   . Crohn's disease Son   . Stroke Mother     BP 112/78  Pulse 73  Temp 97.8 F (36.6 C) (Oral)  Ht 5\' 5"  (1.651 m)  Wt 152 lb (68.947 kg)  BMI 25.29 kg/m2  SpO2 99%  Review of Systems Denies weight change.  She has nausea and fatigue.  Objective:   Physical Exam VITAL SIGNS:  See vs page GENERAL: no distress NECK: There is no palpable thyroid enlargement.  No thyroid nodule is palpable.  No palpable lymphadenopathy at the anterior neck.     Lab Results  Component Value Date   TSH 2.77 05/09/2012   T4TOTAL 8.5 08/31/2011      Assessment & Plan:  H/o hyperthyroidism, resolved H/o small multinodular goiter, clinically unchanged Cough, new

## 2012-05-09 NOTE — Patient Instructions (Addendum)
Let's recheck the thyroid ultrasound.  you will receive a phone call, about a day and time for an appointment. blood tests are being requested for you today.  You will receive a letter with each of your test results.  Refer to an ear-nose-throat specialist.  you will receive a phone call, about a day and time for an appointment.   here is a sample of "advair-100."  take 1 puff 2x a day.  rinse mouth after using.

## 2012-05-15 ENCOUNTER — Ambulatory Visit (HOSPITAL_COMMUNITY)
Admission: RE | Admit: 2012-05-15 | Discharge: 2012-05-15 | Disposition: A | Discharge status: Home / Self Care | Payer: BC Managed Care – PPO | Source: Ambulatory Visit | Attending: Endocrinology | Admitting: Endocrinology

## 2012-05-15 DIAGNOSIS — E0590 Thyrotoxicosis, unspecified without thyrotoxic crisis or storm: Secondary | ICD-10-CM

## 2012-05-15 DIAGNOSIS — E042 Nontoxic multinodular goiter: Secondary | ICD-10-CM | POA: Insufficient documentation

## 2012-05-16 ENCOUNTER — Telehealth: Payer: Self-pay

## 2012-05-16 ENCOUNTER — Encounter: Payer: Self-pay | Admitting: Endocrinology

## 2012-05-16 NOTE — Telephone Encounter (Signed)
Pt advised of thyroid US results per SAE, letter mailed.

## 2012-05-29 ENCOUNTER — Ambulatory Visit (INDEPENDENT_AMBULATORY_CARE_PROVIDER_SITE_OTHER): Payer: BC Managed Care – PPO | Admitting: Internal Medicine

## 2012-05-29 VITALS — BP 118/72 | HR 71 | Temp 98.3°F | Wt 151.0 lb

## 2012-05-29 DIAGNOSIS — R059 Cough, unspecified: Secondary | ICD-10-CM

## 2012-05-29 DIAGNOSIS — R05 Cough: Secondary | ICD-10-CM

## 2012-05-29 MED ORDER — FLUTICASONE PROPIONATE 50 MCG/ACT NA SUSP: 2.0000 | Freq: Every day | NASAL | Status: DC

## 2012-05-29 MED ORDER — ESOMEPRAZOLE MAGNESIUM 40 MG PO CPDR: 40.0000 mg | DELAYED_RELEASE_CAPSULE | Freq: Two times a day (BID) | ORAL | Status: DC

## 2012-05-29 NOTE — Progress Notes (Signed)
  Subjective:    Patient ID: Cynthia Thornton, female    DOB: 1946-01-13, 66 y.o.   MRN: [id number]  HPI Acute visit 2 and half months history of daily dry cough, "I feel something in my throat, I need to clear it constantly". She is a former smoker, mother had lung cancer she is very concerned. Cough is in the daytime, no nocturnal cough.   Past Medical History: Hyperthyroidism + Goiter Sees Dr Everardo All GERD, HH , esophageal stricture--(EGD 2005 and 10-07 GERD/stricture) COLONIC POLYPS, BENIGN DJD LOW BACK PAIN SYNDROME  SYMPTOM, HEADACHE  Family history of strokes, on aspirin  Past Surgical History: Hysterectomy for heavy menses & anemia No oophorectomy  Tubal ligation Tonsillectomy   Family History: Breast Cancer: Sister, Aunts, Cousin--all maternal Lung cancer -- M Colon Cancer: Father 66 yo, Pat. Uncle 57 yo, cousin Ovarian Cancer: Mat. 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 Has a history of GERD and esophageal stricture, denies any dysphasia or odynophagia. Previously, GERD symptom was chest pain, never had classic heartburn. At this point she is essentially asymptomatic from a GERD standpoint. Denies any wheezing, no weight loss    Objective:   Physical Exam General -- alert, well-developed, healthy appearing and well-nourished.   Neck --no LADs or supraclavicular masses HEENT -- TMs normal, throat w/o redness, face symmetric and not tender to palpation  Lungs -- normal respiratory effort, no intercostal retractions, no accessory muscle use, and normal breath sounds.   Heart-- normal rate, regular rhythm, no  murmur, and no gallop.   Abdomen--soft, non-tender, no distention, no masses, no HSM, no guarding, and no rigidity.   Extremities-- no pretibial edema bilaterally Neurologic-- alert & oriented X3 and strength normal in all extremities. Psych-- Cognition and judgment appear intact. Alert and cooperative with normal attention span and concentration.  not anxious appearing and not depressed appearing.       Assessment & Plan:

## 2012-05-29 NOTE — Assessment & Plan Note (Addendum)
New problem. Cough for the last 2 and half months. Has no symptoms of allergies. History of GERD, currently asymptomatic, takes PPIs once a day. She's not taking any medications that could cause cough. Plan: Chest x-ray Increase PPIs to twice a day. Trial with Flonase Will need a pulmonary referral if not improving with medication. See instructions.

## 2012-05-29 NOTE — Patient Instructions (Addendum)
Please get your x-ray at the other [place]  office located at: [address] [place], across from [facility].  Please go to the basement, this is a walk-in facility, they are open from 8:30 to 5:30 PM. Phone number [phone]. --- Take  Nexium twice a day  for a month and flonase daily , let me know the results in few weeks. If you continue coughing, we'll have to arrange a referral

## 2012-05-30 ENCOUNTER — Ambulatory Visit (INDEPENDENT_AMBULATORY_CARE_PROVIDER_SITE_OTHER)
Admission: RE | Admit: 2012-05-30 | Discharge: 2012-05-30 | Disposition: A | Discharge status: Home / Self Care | Payer: BC Managed Care – PPO | Source: Ambulatory Visit | Attending: Internal Medicine | Admitting: Internal Medicine

## 2012-05-30 ENCOUNTER — Encounter: Payer: Self-pay | Admitting: Internal Medicine

## 2012-05-30 DIAGNOSIS — R059 Cough, unspecified: Secondary | ICD-10-CM

## 2012-05-30 DIAGNOSIS — R05 Cough: Secondary | ICD-10-CM

## 2012-06-07 ENCOUNTER — Encounter: Payer: Self-pay | Admitting: *Deleted

## 2012-06-10 ENCOUNTER — Telehealth: Payer: Self-pay | Admitting: *Deleted

## 2012-06-10 NOTE — Telephone Encounter (Signed)
Pt left VM requesting result of x-ray. Left message to call office     Notes Recorded by Wanda Plump, MD on 05/31/2012 at 4:42 PM Advise patient, chest x-ray okay except for some scars at their right side. If she's not better in few weeks, we'll refer her to pulmonary as planned

## 2012-06-11 ENCOUNTER — Telehealth: Payer: Self-pay | Admitting: *Deleted

## 2012-06-11 NOTE — Telephone Encounter (Signed)
Pt left vm on triage line requesting results about her chest x-ray, noted PCP assistant was unable to get in touch with pt and letter was mailed out, left vm on pt mobile to call office

## 2012-06-12 NOTE — Telephone Encounter (Signed)
Called pt to advise, left vm on pt mobile to call office about results

## 2012-06-13 NOTE — Telephone Encounter (Signed)
Discuss with patient  

## 2012-06-13 NOTE — Telephone Encounter (Signed)
See previous telephone encounter.

## 2012-06-20 ENCOUNTER — Ambulatory Visit (INDEPENDENT_AMBULATORY_CARE_PROVIDER_SITE_OTHER): Payer: BC Managed Care – PPO | Admitting: Family Medicine

## 2012-06-20 ENCOUNTER — Encounter: Payer: Self-pay | Admitting: Family Medicine

## 2012-06-20 VITALS — BP 118/70 | HR 79 | Temp 98.3°F | Wt 157.2 lb

## 2012-06-20 DIAGNOSIS — M62830 Muscle spasm of back: Secondary | ICD-10-CM

## 2012-06-20 DIAGNOSIS — M5380 Other specified dorsopathies, site unspecified: Secondary | ICD-10-CM

## 2012-06-20 MED ORDER — CYCLOBENZAPRINE HCL 10 MG PO TABS: 10.0000 mg | ORAL_TABLET | Freq: Three times a day (TID) | ORAL | Status: AC | PRN

## 2012-06-20 NOTE — Patient Instructions (Signed)
Thoracic Strain You have injured the muscles or tendons that attach to the upper part of your back behind your chest. This injury is called a thoracic strain, thoracic sprain, or mid-back strain.  CAUSES  The cause of thoracic strain varies. A less severe injury involves pulling a muscle or tendon without tearing it. A more severe injury involves tearing (rupturing) a muscle or tendon. With less severe injuries, there may be little loss of strength. Sometimes, there are breaks (fractures) in the bones to which the muscles are attached. These fractures are rare, unless there was a direct hit (trauma) or you have weak bones due to osteoporosis or age. Longstanding strains may be caused by overuse or improper form during certain movements. Obesity can also increase your risk for back injuries. Sudden strains may occur due to injury or not warming up properly before exercise. Often, there is no obvious cause for a thoracic strain. SYMPTOMS  The main symptom is pain, especially with movement, such as during exercise. DIAGNOSIS  Your caregiver can usually tell what is wrong by taking an X-ray and doing a physical exam. TREATMENT   Physical therapy may be helpful for recovery. Your caregiver can give you exercises to do or refer you to a physical therapist after your pain improves.   After your pain improves, strengthening and conditioning programs appropriate for your sport or occupation may be helpful.   Always warm up before physical activities or athletics. Stretching after physical activity may also help.   Certain over-the-counter medicines may also help. Ask your caregiver if there are medicines that would help you.  If this is your first thoracic strain injury, proper care and proper healing time before starting activities should prevent long-term problems. Torn ligaments and tendons require as long to heal as broken bones. Average healing times may be only 1 week for a mild strain. For torn  muscles and tendons, healing time may be up to 6 weeks to 2 months. HOME CARE INSTRUCTIONS   Apply ice to the injured area. Ice massages may also be used as directed.   Put ice in a plastic bag.   Place a towel between your skin and the bag.   Leave the ice on for 15 to 20 minutes, 3 to 4 times a day, for the first 2 days.   Only take over-the-counter or prescription medicines for pain, discomfort, or fever as directed by your caregiver.   Keep your appointments for physical therapy if this was prescribed.   Use wraps and back braces as instructed.  SEEK IMMEDIATE MEDICAL CARE IF:   You have an increase in bruising, swelling, or pain.   Your pain has not improved with medicines.   You develop new shortness of breath, chest pain, or fever.   Problems seem to be getting worse rather than better.  MAKE SURE YOU:   Understand these instructions.   Will watch your condition.   Will get help right away if you are not doing well or get worse.  Document Released: 01/06/2004 Document Revised: 10/05/2011 Document Reviewed: 12/02/2010 Exit[employer] Patient Information 2012 [employer]. 

## 2012-06-20 NOTE — Progress Notes (Signed)
  Subjective:    Patient ID: Cynthia Thornton, female    DOB: Jan 19, 1946, 66 y.o.   MRN: [id number]  HPI Pt here c/o muscle spasms R shoulder blade.  No known injury.  It has occurred before and a muscle relaxer helped.  No other symptoms.   Review of Systems As above    Objective:   Physical Exam  Vitals reviewed. Constitutional: She is oriented to person, place, and time. She appears well-developed and well-nourished.  Musculoskeletal: She exhibits tenderness.        + muscle spasms R trap Myofacial tech used T1-8--- with no relief  Neurological: She is alert and oriented to person, place, and time.          Assessment & Plan:  Muscle spasms---- warm compresses                     Flexeril 10 mg tid prn

## 2012-06-21 ENCOUNTER — Telehealth: Payer: Self-pay | Admitting: Internal Medicine

## 2012-06-21 DIAGNOSIS — R05 Cough: Secondary | ICD-10-CM

## 2012-06-21 DIAGNOSIS — R053 Chronic cough: Secondary | ICD-10-CM

## 2012-06-21 NOTE — Telephone Encounter (Signed)
Pt has called and is not getting any better and would like to schedule a pulm appt but would like to talk with you, amym

## 2012-06-21 NOTE — Telephone Encounter (Signed)
Called pt unable to leave msg 

## 2012-06-25 NOTE — Telephone Encounter (Signed)
Pt states that she is not feeling any better & would like to be referred to pulmonary. Please advise.

## 2012-06-25 NOTE — Telephone Encounter (Signed)
Pt called again regarding referral for her persistent cough.

## 2012-06-28 NOTE — Telephone Encounter (Signed)
Please advise 

## 2012-06-28 NOTE — Telephone Encounter (Signed)
Tried calling pt, unable to leave a msg. Entered referral.

## 2012-06-28 NOTE — Telephone Encounter (Signed)
Please arrange a pulmonary referral, DX persisting cough

## 2012-07-18 ENCOUNTER — Other Ambulatory Visit: Payer: Self-pay | Admitting: *Deleted

## 2012-07-18 DIAGNOSIS — Z09 Encounter for follow-up examination after completed treatment for conditions other than malignant neoplasm: Secondary | ICD-10-CM

## 2012-07-25 ENCOUNTER — Other Ambulatory Visit: Payer: Self-pay | Admitting: Obstetrics and Gynecology

## 2012-07-25 ENCOUNTER — Encounter: Payer: Self-pay | Admitting: Obstetrics and Gynecology

## 2012-07-25 DIAGNOSIS — Z09 Encounter for follow-up examination after completed treatment for conditions other than malignant neoplasm: Secondary | ICD-10-CM

## 2012-07-26 ENCOUNTER — Telehealth: Payer: Self-pay | Admitting: Internal Medicine

## 2012-07-26 ENCOUNTER — Encounter: Payer: Self-pay | Admitting: Internal Medicine

## 2012-07-26 ENCOUNTER — Ambulatory Visit (INDEPENDENT_AMBULATORY_CARE_PROVIDER_SITE_OTHER): Payer: BC Managed Care – PPO | Admitting: Internal Medicine

## 2012-07-26 VITALS — BP 120/80 | HR 87 | Temp 98.3°F | Ht 65.0 in | Wt 153.6 lb

## 2012-07-26 DIAGNOSIS — R918 Other nonspecific abnormal finding of lung field: Secondary | ICD-10-CM

## 2012-07-26 DIAGNOSIS — R05 Cough: Secondary | ICD-10-CM

## 2012-07-26 DIAGNOSIS — Z87891 Personal history of nicotine dependence: Secondary | ICD-10-CM

## 2012-07-26 DIAGNOSIS — R053 Chronic cough: Secondary | ICD-10-CM

## 2012-07-26 DIAGNOSIS — R059 Cough, unspecified: Secondary | ICD-10-CM

## 2012-07-26 MED ORDER — CYCLOBENZAPRINE HCL 10 MG PO TABS: 10.0000 mg | ORAL_TABLET | Freq: Two times a day (BID) | ORAL | Status: DC | PRN

## 2012-07-26 NOTE — Telephone Encounter (Signed)
Caller: [place]/Patient; Patient Name: Cynthia Thornton; PCP: Willow Ora; Best Callback Phone Number: [phone]; Reason for call: Patient states she was seen for  Shoulder pain 06/20/12 with Dr. Laury Axon. She was given a muscle relaxer and told to get a deep tissue massage.  She states she felt so much better.  She reports that she began to feel a dull ache "Right shoulder" on Monday 07/22/12.  She states this is much worse this time. She is unable to get comfortable.  She has taken the muscle relaxers three times a day since Tuesday 07/23/12 along with Ibuprofen and had a massage on Wednesday 07/14/12. Using a heating pad.  She states that she is not any better. Pain located - Shoulder right scapular pain.  Emergent s/sx ruled out per Shoulder Non-Injury with exception to "Severe pain with movement that limits normal activities" See Provider in 72 hours.  Patient declines appointment.  She is requesting a "larger Vera pill" that will relax the muscle.  When advised she would need to be evaluated prior to medication being called in .Marland KitchenMarland KitchenMarland KitchenShe then ask would the office refill Muscle relaxer that she is currently talking "Flexaril".  Home care reviewed with patient.  Understanding expressed. Advised I would forward request but office policy is she would need to be seen. Caller requesting message to the office

## 2012-07-26 NOTE — Telephone Encounter (Signed)
Please advise 

## 2012-07-26 NOTE — Patient Instructions (Addendum)
Please have CT chest without contrast  Please have breathing test called PFT Return to see me after above

## 2012-07-26 NOTE — Telephone Encounter (Signed)
Advise patient: Needs to be seen next week, ER if symptoms are severe or if has  fever, chills, shortness of breath, rash or chest pain. Will call Flexeril 10 mg one by mouth twice a day #12, no refills (enough to  last her until next week) Alternatively, we could refer her to orthopedic surgery instead if so desired

## 2012-07-26 NOTE — Telephone Encounter (Signed)
Discussed with pt, sent in rx.  

## 2012-07-26 NOTE — Progress Notes (Signed)
Subjective:    Patient ID: Cynthia Thornton, female    DOB: [birth year], 66 y.o.   MRN: [id number]  HPI PCP is Willow Ora, MD . Body mass index is 25.56 kg/(m^2).   reports that she quit smoking about 16 years ago. Her smoking use included Cigarettes. She has a 2 pack-year smoking history. She has never used smokeless tobacco.   IOV 07/26/2012   66 year old female. 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  Reports chronic cough x 7 months ([facility] 2013). Insidious onset. Does not remember preceding URI prior to onset of cough.   There is always associated hoarseness of voice,  clearing of throat, mucus in throat that is small in amount and she has difficulty getting it out (is creamy in color) which is all progressive.  No associated tickle in throat or sinus drainage. Talking makes symptoms worse. Says giving hx to me in office makes her voice hoarse. End of day makes her worse due to talking. SHe is frustrated that because talking is her job and talking make symptoms worse and she is unable to execute her job.   GERD hx  Has had GERD since 2003 and on nexium since. In July 2013 took medical help for cough and referred to  ENT eval: per her hx they told her it was GERD related cough but shey says that GERD symptoms are different from current cough. In terms of diet: drink tea, drinks diet pepsi 2-4 cans per day, but avoids pizza, fried food. Will have cheese on salad. Has only occasional chocolate. Eats bran cereal a day. Lunch is fruit and soup.   SINUS hx Denies post nasal sinus drainage. Denies tickle down throat ENT eval per hx in July 2013: normal sinuses. Trial of flonase July-August 2013: tried just one squirt and she did not like the sensation. So never used it.   ACE INHIBITOR Denies associated ACE inhibitor intake   SMOKING  Hx  1-3 cigs per day. Quit 16 years ago at age 66. Started smoking age 64.   FAm Hx  - mom died of lung cancer and had cough.  Patient very concerned that cough is due to lung cancer. I believe she feels guilty she might have missed her mom's cancer when her mom complained of chronic cough. She prefers upfront CT chest     Dr Gretta Cool Reflux Symptom Index (> 13-15 suggestive of LPR cough) 0 -> 5  =  none ->severe problem 07/26/2012   Hoarseness of problem with voice 3  Clearing  Of Throat 4  Excess throat mucus  3  Difficulty swallowing food, liquid or tablets 0  Cough after eating or lying down 0  Breathing difficulties or choking episodes 0  Troublesome or annoying cough 2 but can be worse with weather  Sensation of something sticking in throat or lump in throat 4  Heartburn, chest pain, indigestion, or stomach acid coming up 0  TOTAL 16     Kouffman Reflux v Neurogenic Cough Differentiator Reflux Comments  Do you awaken from a sound sleep coughing violently?                            With trouble breathing? n   Do you have choking episodes when you cannot  Get enough air, gasping for air ?              n   Do  you usually cough when you lie down into  The bed, or when you just lie down to rest ?                          n   Do you usually cough after meals or eating?         n   Do you cough when (or after) you bend over?    n   GERD SCORE  0   Kouffman Reflux v Neurogenic Cough Differentiator Neurogenic   Do you more-or-less cough all day long? yes   Does change of temperature make you cough? n   Does laughing or chuckling cause you to cough? n   Do fumes (perfume, automobile fumes, burned  Toast, etc.,) cause you to cough ?      n   Does speaking, singing, or talking on the phone cause you to cough   ?               yes   Neurogenic/Airway score 2       CXR 05/30/12 Clinical Data: Cough for 2-3 months, former smoker  CHEST - 2 VIEW  Comparison: CT abdomen pelvis - 10/07/2004  Findings:  Normal cardiac silhouette and mediastinal contours. The lungs  appear hyperinflated. There are minimal  linear heterogeneous  opacities within the right middle lobe and lingula. No focal  airspace opacities. No pleural effusion or pneumothorax. No acute  osseous abnormalities.  IMPRESSION:  1. No acute cardiopulmonary disease.  2. Hyperexpanded lungs with minimal linear heterogeneous opacities  within the right middle lobe and lingula, nonspecific possibly  atelectasis or scar, though chronic atypical infection (MAI) may  have a similar appearance.  Original Report Authenticated By: Waynard Reeds, M.D.    Past Medical History  Diagnosis Date  . Osteopenia   . DUB (dysfunctional uterine bleeding)   . Arthritis   . Hiatal hernia   . Esophageal reflux   . Hyperthyroidism   . Esophageal stricture   . Personal history of colonic polyps 09/06/2001    hyperplastic  . Pityriasis rosea      Family History  Problem Relation Age of Onset  . Osteoporosis Mother   . Lung cancer Mother   . Diabetes Father   . Hypertension Father   . Osteoporosis Father   . Colon cancer Father 73  . Breast cancer Sister   . Ovarian cancer Maternal Aunt     great  . Breast cancer Cousin     maternal  . Breast cancer Maternal Aunt     great  . Colon cancer Paternal Uncle 34  . Colon cancer Cousin     maternal  . Ovarian cancer Maternal Aunt   . Crohn's disease Son   . Stroke Mother      History   Social History  . Marital Status: Divorced    Spouse Name: N/A    Number of Children: 2  . Years of Education: N/A   Occupational History  . teacher [employer]  . retired Scientist, research (life sciences)    Social History Main Topics  . Smoking status: Former Smoker -- 0.2 packs/day for 10 years    Types: Cigarettes    Quit date: 10/31/1995  . Smokeless tobacco: Never Used  . Alcohol Use: 0.0 oz/week     Rare  . Drug Use: No  . Sexually Active: No   Other Topics Concern  .  Not on file   Social History Narrative  . No narrative on file     Allergies  Allergen Reactions  .  Codeine Nausea Only  . Propranolol Hcl     REACTION: DIZZY     Outpatient Prescriptions Prior to Visit  Medication Sig Dispense Refill  . b complex vitamins capsule Take 1 capsule by mouth daily.        . betamethasone, augmented, (DIPROLENE) 0.05 % lotion Apply topically as needed.        . Cholecalciferol (VITAMIN D PO) Take 2,000 Units by mouth.        . Clorazepate Dipotassium (TRANXENE-SD PO) Take 7.5 mg by mouth as needed.       Marland Kitchen esomeprazole (NEXIUM) 40 MG capsule Take 1 capsule (40 mg total) by mouth 2 (two) times daily. Take one tablet by mouth once a day  60 capsule  11  . fluticasone (FLONASE) 50 MCG/ACT nasal spray Place 2 sprays into the nose daily.  16 g  2       Review of Systems  Constitutional: Negative for fever and unexpected weight change.  HENT: Negative for ear pain, nosebleeds, congestion, sore throat, rhinorrhea, sneezing, trouble swallowing, dental problem, postnasal drip and sinus pressure.   Eyes: Negative for redness and itching.  Respiratory: Positive for cough. Negative for chest tightness, shortness of breath and wheezing.   Cardiovascular: Negative for palpitations and leg swelling.  Gastrointestinal: Negative for nausea and vomiting.  Genitourinary: Negative for dysuria.  Musculoskeletal: Negative for joint swelling.  Skin: Positive for rash.  Neurological: Negative for headaches.  Hematological: Does not bruise/bleed easily.  Psychiatric/Behavioral: Negative for dysphoric mood. The patient is not nervous/anxious.        Objective:   Physical Exam  Vitals reviewed. Constitutional: She is oriented to person, place, and time. She appears well-developed and well-nourished. No distress.       Body mass index is 25.56 kg/(m^2).   HENT:  Head: Normocephalic and atraumatic.  Right Ear: External ear normal.  Left Ear: External ear normal.  Mouth/Throat: Oropharynx is clear and moist. No oropharyngeal exudate.       Clears the throat a  lot Talking constantly   Eyes: Conjunctivae normal and EOM are normal. Pupils are equal, round, and reactive to light. Right eye exhibits no discharge. Left eye exhibits no discharge. No scleral icterus.  Neck: Normal range of motion. Neck supple. No JVD present. No tracheal deviation present. No thyromegaly present.  Cardiovascular: Normal rate, regular rhythm, normal heart sounds and intact distal pulses.  Exam reveals no gallop and no friction rub.   No murmur heard. Pulmonary/Chest: Effort normal and breath sounds normal. No respiratory distress. She has no wheezes. She has no rales. She exhibits no tenderness.  Abdominal: Soft. Bowel sounds are normal. She exhibits no distension and no mass. There is no tenderness. There is no rebound and no guarding.  Musculoskeletal: Normal range of motion. She exhibits no edema and no tenderness.  Lymphadenopathy:    She has no cervical adenopathy.  Neurological: She is alert and oriented to person, place, and time. She has normal reflexes. No cranial nerve deficit. She exhibits normal muscle tone. Coordination normal.  Skin: Skin is warm and dry. No rash noted. She is not diaphoretic. No erythema. No pallor.  Psychiatric: She has a normal mood and affect. Her behavior is normal. Judgment and thought content normal.       Broke into tear spell giving history due to persistence  of cough          Assessment & Plan:

## 2012-07-28 ENCOUNTER — Telehealth: Payer: Self-pay | Admitting: Internal Medicine

## 2012-07-28 DIAGNOSIS — J479 Bronchiectasis, uncomplicated: Secondary | ICD-10-CM | POA: Insufficient documentation

## 2012-07-28 NOTE — Assessment & Plan Note (Signed)
RSI score 16 is very suggestive of LPR or Irritable larynix cough brought on b several etiologies esp GERD. I Am sure GERD is playing a role but she is not in acceptance of this. Cough varinat asthma is naother possibilty. Wil get PFTs. IF negative, will get methacholine challenge. She is very concerned cough might be due to lung cancer and she prefers a CT scan chest now. We discussed false positives and xrt risk but she wants to proceed with CT chest now instead of later in the cough algorithm. This is fine. Therefore, will also get PFTs  Will regroup after above

## 2012-07-28 NOTE — Telephone Encounter (Signed)
Hi Sean  I am seeing this lady for chronic cough. 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 However, could nto find enough info on this. I would like to know how much of a aproblem this is because as you know large goiters could cause obstructive upper airway smptoms  Thanks  Shantera Monts

## 2012-07-29 NOTE — Telephone Encounter (Signed)
The goiter is pretty small, so i think it is unlikely.  However, i did ref her to ent.  i don't know if she went, though  Cynthia Thornton

## 2012-07-29 NOTE — Telephone Encounter (Signed)
Ok thanks. She went to ENT does not know who.

## 2012-08-02 ENCOUNTER — Ambulatory Visit (INDEPENDENT_AMBULATORY_CARE_PROVIDER_SITE_OTHER): Payer: BC Managed Care – PPO | Admitting: Family Medicine

## 2012-08-02 ENCOUNTER — Encounter: Payer: Self-pay | Admitting: Family Medicine

## 2012-08-02 ENCOUNTER — Ambulatory Visit (INDEPENDENT_AMBULATORY_CARE_PROVIDER_SITE_OTHER)
Admission: RE | Admit: 2012-08-02 | Discharge: 2012-08-02 | Disposition: A | Discharge status: Home / Self Care | Payer: BC Managed Care – PPO | Source: Ambulatory Visit | Attending: Internal Medicine | Admitting: Internal Medicine

## 2012-08-02 VITALS — BP 110/80 | HR 86 | Temp 98.6°F | Resp 16 | Wt 153.0 lb

## 2012-08-02 DIAGNOSIS — M5380 Other specified dorsopathies, site unspecified: Secondary | ICD-10-CM

## 2012-08-02 DIAGNOSIS — R053 Chronic cough: Secondary | ICD-10-CM

## 2012-08-02 DIAGNOSIS — M62830 Muscle spasm of back: Secondary | ICD-10-CM

## 2012-08-02 DIAGNOSIS — R05 Cough: Secondary | ICD-10-CM

## 2012-08-02 DIAGNOSIS — R059 Cough, unspecified: Secondary | ICD-10-CM

## 2012-08-02 DIAGNOSIS — M5410 Radiculopathy, site unspecified: Secondary | ICD-10-CM

## 2012-08-02 DIAGNOSIS — R918 Other nonspecific abnormal finding of lung field: Secondary | ICD-10-CM

## 2012-08-02 DIAGNOSIS — Z87891 Personal history of nicotine dependence: Secondary | ICD-10-CM

## 2012-08-02 DIAGNOSIS — IMO0002 Reserved for concepts with insufficient information to code with codable children: Secondary | ICD-10-CM

## 2012-08-02 MED ORDER — NAPROXEN 500 MG PO TABS: 500.0000 mg | ORAL_TABLET | Freq: Two times a day (BID) | ORAL | Status: DC

## 2012-08-02 MED ORDER — CYCLOBENZAPRINE HCL 10 MG PO TABS: 10.0000 mg | ORAL_TABLET | Freq: Two times a day (BID) | ORAL | Status: DC | PRN

## 2012-08-02 NOTE — Patient Instructions (Addendum)
We'll notify you of your ortho appt Continue the flexeril as needed for spasm Add the Naproxen twice daily for inflammation- take w/ food HEAT! Call with any questions or concerns Hang in there!!!

## 2012-08-02 NOTE — Progress Notes (Signed)
  Subjective:    Patient ID: Cynthia Thornton, female    DOB: 03/26/1946, 66 y.o.   MRN: [id number]  HPI R shoulder pain- saw Dr Laury Axon the end of August and was dx'd w/ muscle spasm.  Given muscle relaxer, got massage and sxs dramatically improved.  sxs started 7-10 days ago again and she called office to get more Flexeril.  Pt reports pain is in 'exact same spot' as previous.  Yesterday developed numbness and tingling radiating down R arm intermittently.  Last night reported 'entire body felt numb'.  Pt having long days at work, very stressed.  Starts the day as a dull ache but ends the day as 'horrible'.  Every hour or 2 today will have sensation of strong squeezing of R upper arm.   Review of Systems For ROS see HPI     Objective:   Physical Exam  Vitals reviewed. Constitutional: She appears well-developed and well-nourished. No distress.  Musculoskeletal:       Right shoulder: She exhibits normal range of motion, no tenderness, no bony tenderness, no swelling, no crepitus, no deformity and normal strength.       Arms:      (-) impingement signs (-) sperlings After PE pt reported pain and numbness of R arm          Assessment & Plan:

## 2012-08-03 NOTE — Assessment & Plan Note (Signed)
New to provider.  Start scheduled NSAIDs, refill muscle relaxers, heating pad prn.  Reviewed supportive care and red flags that should prompt return.  Pt expressed understanding and is in agreement w/ plan.

## 2012-08-03 NOTE — Assessment & Plan Note (Signed)
New to provider.  Pt's shoulder w/ full ROM w/out pain, no actual joint pain.  Pt only developed pain and numbness after PE was complete.  Will tx as above and refer to ortho for complete evaluation and possible nerve conduction study.

## 2012-08-14 ENCOUNTER — Other Ambulatory Visit: Payer: Self-pay | Admitting: *Deleted

## 2012-08-14 ENCOUNTER — Telehealth: Payer: Self-pay | Admitting: Internal Medicine

## 2012-08-14 DIAGNOSIS — R922 Inconclusive mammogram: Secondary | ICD-10-CM

## 2012-08-14 DIAGNOSIS — Z853 Personal history of malignant neoplasm of breast: Secondary | ICD-10-CM

## 2012-08-14 NOTE — Telephone Encounter (Signed)
I am seeing her 10/29 for fu cough but she is very anxious person. Pleaset let her know that CT scan chest 08/02/12 does not show any evidence of lung cancer. Only abnormality is mild widening of breathing passage in small area of the lung that can explain cough. I will discuss that face to face 08/27/12 after PFT  Need exhaled NO on 08/27/12

## 2012-08-20 ENCOUNTER — Telehealth: Payer: Self-pay | Admitting: Internal Medicine

## 2012-08-20 NOTE — Telephone Encounter (Signed)
Pt returned Jennifer's call.  Pt stated that her phone did not ring...she just got a message & returned Jennifer's call.  Antionette Fairy

## 2012-08-20 NOTE — Telephone Encounter (Signed)
Pt returned call. Pt will be going out of town tomorrow.

## 2012-08-20 NOTE — Telephone Encounter (Signed)
[email or web address]. Carron Curie, CMA

## 2012-08-20 NOTE — Telephone Encounter (Signed)
Pt is aware of results. She voiced her understanding and had no questions. Will discuss with MR at f/u

## 2012-08-20 NOTE — Telephone Encounter (Signed)
Pt returned call. [phone]. Cynthia Thornton

## 2012-08-20 NOTE — Telephone Encounter (Signed)
Called home # NA, no voicemail. [email or web address] on cell number. Carron Curie, CMA

## 2012-08-20 NOTE — Telephone Encounter (Signed)
Previous Message closed in error & copied below:    Carron Curie, [facility] 08/20/2012 10:22 AM Signed  [email or web address]. Carron Curie, CMA   Cynthia Thornton [place] 08/20/2012 10:10 AM Signed  Pt returned call. [phone]. Cynthia Thornton [address] [place] 08/20/2012 9:59 AM Signed  Called home # NA, no voicemail. [email or web address] on cell number. Carron Curie, CMA   [facility], MD 08/14/2012 3:25 PM Signed  I am seeing her 10/29 for fu cough but she is very anxious person. Pleaset let her know that CT scan chest 08/02/12 does not show any evidence of lung cancer. Only abnormality is mild widening of breathing passage in small area of the lung that can explain cough. I will discuss that face to face 08/27/12 after PFT  Need exhaled NO on 08/27/12          Encounter MyChart Messages     No messages in this encounter         Routing History        From  To  Priority    08/20/2012 12:12 PM  [name]  Cynthia Thornton, [place]  Routine    08/20/2012 10:10 AM  Trudi Ida, CMA  Routine    08/14/2012 3:25 PM  Kalman Shan, MD  Cynthia Thornton, CMA  Routine       Created by     Kalman Shan, MD on 08/14/2012 3:23 PM   Cynthia Thornton

## 2012-08-21 NOTE — Telephone Encounter (Signed)
Pt advised. Jennifer Castillo, CMA  

## 2012-08-23 ENCOUNTER — Ambulatory Visit: Payer: BC Managed Care – PPO | Admitting: Internal Medicine

## 2012-08-27 ENCOUNTER — Ambulatory Visit: Payer: BC Managed Care – PPO | Admitting: Internal Medicine

## 2012-08-29 ENCOUNTER — Other Ambulatory Visit: Payer: Self-pay | Admitting: Obstetrics and Gynecology

## 2012-08-29 DIAGNOSIS — R922 Inconclusive mammogram: Secondary | ICD-10-CM

## 2012-08-29 DIAGNOSIS — Z853 Personal history of malignant neoplasm of breast: Secondary | ICD-10-CM

## 2012-09-03 ENCOUNTER — Encounter: Payer: BC Managed Care – PPO | Admitting: Obstetrics and Gynecology

## 2012-09-09 ENCOUNTER — Ambulatory Visit: Payer: BC Managed Care – PPO | Admitting: Internal Medicine

## 2012-09-09 ENCOUNTER — Encounter: Payer: Self-pay | Admitting: *Deleted

## 2012-09-13 ENCOUNTER — Ambulatory Visit (INDEPENDENT_AMBULATORY_CARE_PROVIDER_SITE_OTHER): Payer: BC Managed Care – PPO | Admitting: Gastroenterology

## 2012-09-13 ENCOUNTER — Encounter: Payer: Self-pay | Admitting: Gastroenterology

## 2012-09-13 ENCOUNTER — Other Ambulatory Visit (INDEPENDENT_AMBULATORY_CARE_PROVIDER_SITE_OTHER): Payer: BC Managed Care – PPO

## 2012-09-13 VITALS — BP 110/70 | HR 74 | Ht 64.5 in | Wt 151.4 lb

## 2012-09-13 DIAGNOSIS — D1803 Hemangioma of intra-abdominal structures: Secondary | ICD-10-CM

## 2012-09-13 DIAGNOSIS — K219 Gastro-esophageal reflux disease without esophagitis: Secondary | ICD-10-CM

## 2012-09-13 DIAGNOSIS — R110 Nausea: Secondary | ICD-10-CM

## 2012-09-13 DIAGNOSIS — R1013 Epigastric pain: Secondary | ICD-10-CM

## 2012-09-13 LAB — CBC WITH DIFFERENTIAL/PLATELET
Basophils Relative: 0.7 % (ref 0.0–3.0)
Eosinophils Relative: 1.7 % (ref 0.0–5.0)
Lymphocytes Relative: 32.4 % (ref 12.0–46.0)
Neutrophils Relative %: 55.3 % (ref 43.0–77.0)
RBC: 3.97 Mil/uL (ref 3.87–5.11)
WBC: 6.9 10*3/uL (ref 4.5–10.5)

## 2012-09-13 LAB — COMPREHENSIVE METABOLIC PANEL
AST: 14 U/L (ref 0–37)
Albumin: 4.5 g/dL (ref 3.5–5.2)
BUN: 17 mg/dL (ref 6–23)
Calcium: 9.4 mg/dL (ref 8.4–10.5)
Chloride: 104 mEq/L (ref 96–112)
Glucose, Bld: 88 mg/dL (ref 70–99)
Potassium: 3.9 mEq/L (ref 3.5–5.1)
Sodium: 137 mEq/L (ref 135–145)
Total Protein: 7.1 g/dL (ref 6.0–8.3)

## 2012-09-13 LAB — AMYLASE: Amylase: 72 U/L (ref 27–131)

## 2012-09-13 LAB — IBC PANEL: Transferrin: 265 mg/dL (ref 212.0–360.0)

## 2012-09-13 LAB — VITAMIN B12: Vitamin B-12: 351 pg/mL (ref 211–911)

## 2012-09-13 LAB — LIPASE: Lipase: 29 U/L (ref 11.0–59.0)

## 2012-09-13 MED ORDER — ONDANSETRON HCL 4 MG PO TABS: 4.0000 mg | ORAL_TABLET | Freq: Two times a day (BID) | ORAL | Status: DC | PRN

## 2012-09-13 MED ORDER — ESOMEPRAZOLE MAGNESIUM 40 MG PO CPDR: 40.0000 mg | DELAYED_RELEASE_CAPSULE | Freq: Two times a day (BID) | ORAL | Status: DC

## 2012-09-13 NOTE — Patient Instructions (Addendum)
You have been scheduled for an abdominal ultrasound at [facility] Radiology (1st floor of hospital) on 09-16-2012 at 10AM. Please arrive 15 minutes prior to your appointment for registration. Make certain not to have anything to eat or drink 6 hours prior to your appointment. Should you need to reschedule your appointment, please contact radiology at [phone]. This test typically takes about 30 minutes to perform.  You have been scheduled for an endoscopy with propofol. Please follow written instructions given to you at your visit today. If you use inhalers (even only as needed) or a CPAP machine, please bring them with you on the day of your procedure.  Your physician has requested that you go to the basement for lab work before leaving today  Please increase Nexium to twice daily   We have sent the following medications to your pharmacy for you to pick up at your convenience: Zofran. Please take as directed

## 2012-09-13 NOTE — Progress Notes (Signed)
This is a very pleasant 66 year old Caucasian female with chronic GERD.  She now has worsening nausea and reflux symptoms despite Nexium 40 mg daily.  She is mostly nausea him a gas and bloating.  She has had a difficult month with back injury requiring Flexeril, Naprosyn, and also a root canal procedure required amoxicillin therapy.  There is no associated diarrhea, melena or hematochezia.  Last endoscopic exam was 5 years ago.  At that time she did have an esophageal stricture.  Previous ultrasound of shown benign liver cyst.  Currently the patient denies hepatobiliary or systemic complaints.  Current Medications, Allergies, Past Medical History, Past Surgical History, Family History and Social History were reviewed in [employer] record.  Pertinent Review of Systems Negative   Physical Exam: Day.  Patient in no acute distress.  Blood pressure 110/70, pulse 74 and regular, and weight 151 pounds with a BMI of 25.59.  I cannot appreciate stigmata of chronic liver disease.  Chest is clear she is in a regular rhythm without murmurs gallops or rubs.  Her abdomen shows no distention, organomegaly, masses or tenderness.  Bowel sounds are nonobstructive.  Mental status is normal and peripheral extremities are unremarkable.    Assessment and Plan: Severe nausea in a patient who has been on NSAIDs, also regular PPI therapy.  We will repeat her upper abdominal ultrasound exam to exclude cholelithiasis, also repeat her endoscopy and biopsies for H. pylori.  In the past she has had recurrent esophageal strictures that may require repeat dilation.  I have increased her Nexium to 40 mg twice a day with when necessary Zofran 4 mg.  Screening lab data also ordered which has not been done recently.  Serum alpha-fetoprotein level also ordered per history of liver cyst  Standard antireflux maneuvers reviewed with patient.  She is up-to-date on her colonoscopy exams. Encounter Diagnoses  Name  Primary?  . Liver hemangioma Yes  . Hx of gallstones

## 2012-09-14 LAB — AFP TUMOR MARKER: AFP-Tumor Marker: 2.4 ng/mL (ref 0.0–8.0)

## 2012-09-16 ENCOUNTER — Ambulatory Visit (HOSPITAL_COMMUNITY)
Admission: RE | Admit: 2012-09-16 | Discharge: 2012-09-16 | Disposition: A | Discharge status: Home / Self Care | Payer: BC Managed Care – PPO | Source: Ambulatory Visit | Attending: Gastroenterology | Admitting: Gastroenterology

## 2012-09-16 DIAGNOSIS — D1803 Hemangioma of intra-abdominal structures: Secondary | ICD-10-CM | POA: Insufficient documentation

## 2012-09-16 DIAGNOSIS — K7689 Other specified diseases of liver: Secondary | ICD-10-CM | POA: Insufficient documentation

## 2012-09-16 DIAGNOSIS — R110 Nausea: Secondary | ICD-10-CM | POA: Insufficient documentation

## 2012-09-16 LAB — CELIAC PANEL 10
Gliadin IgG: 2.5 U/mL (ref <20)
IgA: 116 mg/dL (ref 69–380)

## 2012-09-18 ENCOUNTER — Telehealth: Payer: Self-pay | Admitting: *Deleted

## 2012-09-18 ENCOUNTER — Encounter: Payer: Self-pay | Admitting: Gastroenterology

## 2012-09-18 ENCOUNTER — Ambulatory Visit (AMBULATORY_SURGERY_CENTER): Payer: BC Managed Care – PPO | Admitting: Gastroenterology

## 2012-09-18 VITALS — BP 116/78 | HR 68 | Temp 97.8°F | Resp 18 | Ht 64.5 in | Wt 151.0 lb

## 2012-09-18 DIAGNOSIS — R110 Nausea: Secondary | ICD-10-CM

## 2012-09-18 DIAGNOSIS — R059 Cough, unspecified: Secondary | ICD-10-CM

## 2012-09-18 DIAGNOSIS — R053 Chronic cough: Secondary | ICD-10-CM

## 2012-09-18 DIAGNOSIS — R1110 Vomiting, unspecified: Secondary | ICD-10-CM

## 2012-09-18 DIAGNOSIS — R05 Cough: Secondary | ICD-10-CM

## 2012-09-18 DIAGNOSIS — R1013 Epigastric pain: Secondary | ICD-10-CM

## 2012-09-18 MED ORDER — SODIUM CHLORIDE 0.9 % IV SOLN: 500.0000 mL | INTRAVENOUS | Status: DC

## 2012-09-18 NOTE — Progress Notes (Signed)
Patient did not experience any of the following events: a burn prior to discharge; a fall within the facility; wrong site/side/patient/procedure/implant event; or a hospital transfer or hospital admission upon discharge from the facility. (G8907) Patient did not have preoperative order for IV antibiotic SSI prophylaxis. (G8918)  

## 2012-09-18 NOTE — Patient Instructions (Addendum)
YOU HAD AN ENDOSCOPIC PROCEDURE TODAY AT THE [facility]: Refer to the procedure report that was given to you for any specific questions about what was found during the examination.  If the procedure report does not answer your questions, please call your gastroenterologist to clarify.  If you requested that your care partner not be given the details of your procedure findings, then the procedure report has been included in a sealed envelope for you to review at your convenience later.  YOU SHOULD EXPECT: Some feelings of bloating in the abdomen. Passage of more gas than usual.  Walking can help get rid of the air that was put into your GI tract during the procedure and reduce the bloating. If you had a lower endoscopy (such as a colonoscopy or flexible sigmoidoscopy) you may notice spotting of blood in your stool or on the toilet paper. If you underwent a bowel prep for your procedure, then you may not have a normal bowel movement for a few days.  DIET: Your first meal following the procedure should be a light meal and then it is ok to progress to your normal diet.  A half-sandwich or bowl of soup is an example of a good first meal.  Heavy or fried foods are harder to digest and may make you feel nauseous or bloated.  Likewise meals heavy in dairy and vegetables can cause extra gas to form and this can also increase the bloating.  Drink plenty of fluids but you should avoid alcoholic beverages for 24 hours.  ACTIVITY: Your care partner should take you home directly after the procedure.  You should plan to take it easy, moving slowly for the rest of the day.  You can resume normal activity the day after the procedure however you should NOT DRIVE or use heavy machinery for 24 hours (because of the sedation medicines used during the test).    SYMPTOMS TO REPORT IMMEDIATELY: A gastroenterologist can be reached at any hour.  During normal business hours, 8:30 AM to 5:00 PM Monday through Friday,  call [phone].  After hours and on weekends, please call the GI answering service at [phone] who will take a message and have the physician on call contact you.   Following lower endoscopy (colonoscopy or flexible sigmoidoscopy):  Excessive amounts of blood in the stool  Significant tenderness or worsening of abdominal pains  Swelling of the abdomen that is new, acute  Fever of 100F or higher  Following upper endoscopy (EGD)  Vomiting of blood or coffee ground material  New chest pain or pain under the shoulder blades  Painful or persistently difficult swallowing  New shortness of breath  Fever of 100F or higher  Black, tarry-looking stools  FOLLOW UP: If any biopsies were taken you will be contacted by phone or by letter within the next 1-3 weeks.  Call your gastroenterologist if you have not heard about the biopsies in 3 weeks.  Our staff will call the home number listed on your records the next business day following your procedure to check on you and address any questions or concerns that you may have at that time regarding the information given to you following your procedure. This is a courtesy call and so if there is no answer at the home number and we have not heard from you through the emergency physician on call, we will assume that you have returned to your regular daily activities without incident.  SIGNATURES/CONFIDENTIALITY: You and/or your care   partner have signed paperwork which will be entered into your electronic medical record.  These signatures attest to the fact that that the information above on your After Visit Summary has been reviewed and is understood.  Full responsibility of the confidentiality of this discharge information lies with you and/or your care-partner.  

## 2012-09-18 NOTE — Telephone Encounter (Signed)
Scheduled pt for her GES at [facility], 10/09/12 at 0800am, arrive at 0745am at radiology. NPO after midnight and no stomach meds after midnight. No one at [facility] to schedule EM.

## 2012-09-18 NOTE — Op Note (Signed)
[facility] [address] [place], [address]   ENDOSCOPY PROCEDURE REPORT  PATIENT: Cynthia Thornton, Cynthia Thornton  MR#: [id number] BIRTHDATE: [birth year] , 66  yrs. old GENDER: Female ENDOSCOPIST:David Hale Bogus, MD, [facility] REFERRED BY: PROCEDURE DATE:  09/18/2012 PROCEDURE:   EGD w/ biopsy for H.pylori ASA CLASS:    Class II INDICATIONS: unrelenting nausea. MEDICATION: propofol (Diprivan) 150mg  IV TOPICAL ANESTHETIC:   Cetacaine Spray  DESCRIPTION OF PROCEDURE:   After the risks and benefits of the procedure were explained, informed consent was obtained.  The LB GIF-H180 [id number]  endoscope was introduced through the mouth  and advanced to the second portion of the duodenum .  The instrument was slowly withdrawn as the mucosa was fully examined.    The upper, middle and distal third of the esophagus were carefully inspected and no abnormalities were noted.  The z-line was well seen at the GEJ.  The endoscope was pushed into the fundus which was normal including a retroflexed view.  The antrum, gastric body, first and second part of the duodenum were unremarkable. Retroflexed views revealed a hiatal hernia. There is no evidence of retained food products in the stomach. Antral biopsy for CLO exam was done..    The scope was then withdrawn from the patient and the procedure completed.  COMPLICATIONS: There were no complications.   ENDOSCOPIC IMPRESSION: Normal EGD .Marland Kitchen. no cause for her nausea ascertained  on this endoscopy. Recent upper abdominal ultrasound exam showed a stable hepatic cyst.  Review of her blood work otherwise is unremarkable. Antral biopsy done for H. pylori exam. Will proceed with esophageal manometry and consider gastric emptying scan. she possibly has a GI upper motility disorder...  RECOMMENDATIONS: 1.  Await pathology results 2.   esophageal manometry to be scheduled.    _______________________________ eSignedMardella Layman, MD, [facility]  09/18/2012 2:33 PM   standard discharge

## 2012-09-19 ENCOUNTER — Telehealth: Payer: Self-pay | Admitting: *Deleted

## 2012-09-19 LAB — HELICOBACTER PYLORI SCREEN-BIOPSY: UREASE: NEGATIVE

## 2012-09-19 NOTE — Telephone Encounter (Signed)
Left message, name identifier, follow-up call  

## 2012-09-19 NOTE — Telephone Encounter (Signed)
Left message, number identifier, follow-up call

## 2012-09-20 ENCOUNTER — Other Ambulatory Visit: Payer: Self-pay | Admitting: *Deleted

## 2012-09-20 ENCOUNTER — Encounter: Payer: Self-pay | Admitting: Gastroenterology

## 2012-09-20 MED ORDER — DEXLANSOPRAZOLE 60 MG PO CPDR: 60.0000 mg | DELAYED_RELEASE_CAPSULE | Freq: Every day | ORAL | Status: DC

## 2012-09-20 NOTE — Telephone Encounter (Signed)
Informed pt of her EM and GES appts. We had a long discussion of her nausea and what she thinks is the cause; AB taken earlier. Explained what the tests are and why Dr Jarold Motto scheduled them. I offered her an appt to discuss this with him and she has been trying to eliminate meds and the only one left is Nexium. Discussed this with Dr Jarold Motto and he wants the procedures done, but agreed to give pt samples of Dexilant to try until then. I left instructions for the procedures and samples at the front desk.

## 2012-09-30 ENCOUNTER — Ambulatory Visit (INDEPENDENT_AMBULATORY_CARE_PROVIDER_SITE_OTHER): Payer: BC Managed Care – PPO | Admitting: Obstetrics and Gynecology

## 2012-09-30 ENCOUNTER — Encounter: Payer: Self-pay | Admitting: Obstetrics and Gynecology

## 2012-09-30 VITALS — BP 120/74 | Ht 64.0 in | Wt 149.0 lb

## 2012-09-30 DIAGNOSIS — Z01419 Encounter for gynecological examination (general) (routine) without abnormal findings: Secondary | ICD-10-CM

## 2012-09-30 DIAGNOSIS — Z23 Encounter for immunization: Secondary | ICD-10-CM

## 2012-09-30 MED ORDER — BETAMETHASONE DIPROPIONATE AUG 0.05 % EX LOTN: TOPICAL_LOTION | CUTANEOUS | Status: DC | PRN

## 2012-09-30 NOTE — Patient Instructions (Signed)
Consider BRCA1 and BRCA2 testing.

## 2012-09-30 NOTE — Progress Notes (Signed)
Patient came to see me today for her annual GYN exam. She is doing well without hormone replacement. She was recalled in October, 2013 for additional breast views and then B.S. BSI.'s andGI because of dense breast tissue but there was no lesion present. She will return in 6 months for further imaging. She has a sister who had early onset breast cancer but refused BRCA1 and BRCA2 testing. She has a first cousin on her mother's side of the family also had early onset breast cancer and was treated [facility] and she will find out if she had genetic testing. She also has a maternal aunt who had ovarian cancer. The patient had a vaginal hysterectomy and right ovarian cystectomy in 1991 for dysfunctional uterine bleeding. She has never had abnormal Pap smears. Her last Pap smear was 2012. Her bone density was in 2012 and showed osteopenia without an elevated fracture risk. She's had no fractures. She has her cholesterol watched by another physician.  HEENT: Within normal limits. Kennon Portela present. Neck: No masses. Supraclavicular lymph nodes: Not enlarged. Breasts: Examined in both sitting and lying position. Symmetrical without skin changes or masses. Abdomen: Soft no masses guarding or rebound. No hernias. Pelvic: External within normal limits. BUS within normal limits. Vaginal examination shows good estrogen effect, no cystocele enterocele or rectocele. Cervix and uterus absent. Adnexa within normal limits. Rectovaginal confirmatory. Extremities within normal limits.  Assessment: Dense breast tissue. Osteopenia. Family history of early onset breast cancer and ovarian cancer.  Plan: Continue close breasts followup with imaging Center. Continue periodic bone densities. Discussed BRCA1 and BRCA2 testing with patient. Encouraged her to have it done if her family relatives don't go. Refilled Tranxene 7.5 mg #30 with 6 refills. Pap not done.The new Pap smear guidelines were discussed with the patient.

## 2012-09-30 NOTE — Addendum Note (Signed)
Addended by: Dayna Barker on: 09/30/2012 10:24 AM   Modules accepted: Orders

## 2012-10-01 LAB — URINALYSIS W MICROSCOPIC + REFLEX CULTURE
Bilirubin Urine: NEGATIVE
Casts: NONE SEEN
Crystals: NONE SEEN
Glucose, UA: NEGATIVE mg/dL
Hgb urine dipstick: NEGATIVE
Ketones, ur: NEGATIVE mg/dL
Leukocytes, UA: NEGATIVE
Nitrite: NEGATIVE
Protein, ur: NEGATIVE mg/dL
Specific Gravity, Urine: 1.009 (ref 1.005–1.030)
Squamous Epithelial / HPF: NONE SEEN
Urobilinogen, UA: 0.2 mg/dL (ref 0.0–1.0)
pH: 6 (ref 5.0–8.0)

## 2012-10-02 LAB — URINE CULTURE: Colony Count: >=100000

## 2012-10-09 ENCOUNTER — Encounter (HOSPITAL_COMMUNITY): Payer: BC Managed Care – PPO

## 2012-10-11 ENCOUNTER — Ambulatory Visit (INDEPENDENT_AMBULATORY_CARE_PROVIDER_SITE_OTHER): Payer: BC Managed Care – PPO | Admitting: Internal Medicine

## 2012-10-11 ENCOUNTER — Encounter: Payer: Self-pay | Admitting: Internal Medicine

## 2012-10-11 VITALS — BP 128/80 | HR 74 | Temp 98.1°F | Ht 65.0 in | Wt 154.0 lb

## 2012-10-11 DIAGNOSIS — R059 Cough, unspecified: Secondary | ICD-10-CM

## 2012-10-11 DIAGNOSIS — R053 Chronic cough: Secondary | ICD-10-CM

## 2012-10-11 DIAGNOSIS — R05 Cough: Secondary | ICD-10-CM

## 2012-10-11 LAB — PULMONARY FUNCTION TEST

## 2012-10-11 NOTE — Progress Notes (Signed)
Subjective:    Patient ID: Cynthia Thornton, female    DOB: June 21, 1946, 66 y.o.   MRN: [id number]  HPI PCP is Willow Ora, MD . Body mass index is 25.56 kg/(m^2).   reports that she quit smoking about 16 years ago. Her smoking use included Cigarettes. She has a 2 pack-year smoking history. She has never used smokeless tobacco.   IOV 07/26/2012   66 year old female. 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  Reports chronic cough x 7 months ([facility] 2013). Insidious onset. Does not remember preceding URI prior to onset of cough.   There is always associated hoarseness of voice,  clearing of throat, mucus in throat that is small in amount and she has difficulty getting it out (is creamy in color) which is all progressive.  No associated tickle in throat or sinus drainage. Talking makes symptoms worse. Says giving hx to me in office makes her voice hoarse. End of day makes her worse due to talking. SHe is frustrated that because talking is her job and talking make symptoms worse and she is unable to execute her job.   GERD hx  Has had GERD since 2003 and on nexium since. In July 2013 took medical help for cough and referred to  ENT eval: per her hx they told her it was GERD related cough but shey says that GERD symptoms are different from current cough. In terms of diet: drink tea, drinks diet pepsi 2-4 cans per day, but avoids pizza, fried food. Will have cheese on salad. Has only occasional chocolate. Eats bran cereal a day. Lunch is fruit and soup.   SINUS hx Denies post nasal sinus drainage. Denies tickle down throat ENT eval per hx in July 2013: normal sinuses. Trial of flonase July-August 2013: tried just one squirt and she did not like the sensation. So never used it.   ACE INHIBITOR Denies associated ACE inhibitor intake   SMOKING  Hx  1-3 cigs per day. Quit 16 years ago at age 65. Started smoking age 57.   FAm Hx  - mom died of lung cancer and had cough.  Patient very concerned that cough is due to lung cancer. I believe she feels guilty she might have missed her mom's cancer when her mom complained of chronic cough. She prefers upfront CT chest   CXR 05/30/12 Clinical Data: Cough for 2-3 months, former smoker  CHEST - 2 VIEW  Comparison: CT abdomen pelvis - 10/07/2004  Findings:  Normal cardiac silhouette and mediastinal contours. The lungs  appear hyperinflated. There are minimal linear heterogeneous  opacities within the right middle lobe and lingula. No focal  airspace opacities. No pleural effusion or pneumothorax. No acute  osseous abnormalities.  IMPRESSION:  1. No acute cardiopulmonary disease.  2. Hyperexpanded lungs with minimal linear heterogeneous opacities  within the right middle lobe and lingula, nonspecific possibly  atelectasis or scar, though chronic atypical infection (MAI) may  have a similar appearance.  Original Report Authenticated By: Waynard Reeds, M.D.  REC Please have CT chest without contrast  Please have breathing test called PFT  Return to see me after above  OV 10/11/2012  Fu cough  -Since last visit, without interventions , cough has improved 40%. She says drier weather was the reason for improvement. Says, more than cough, clearing of throat is the problem. REsults below of CT and PFT reviewed with patient. AT this stage, she feels she wants expectant  Rx only. Not interested in further testing or intervention  CT 08/02/12  - IMPRESSION:  1. No acute cardiopulmonary abnormalities.  2. Lingular bronchiectasis  3. There are two clustered areas of nodularity within the  periphery of both lungs. These do not have worrisome features and  likely the sequela of atypical infection    PFT  10/11/12 - normal (reviewd) fev1 2.28L/101%, TLC 111%, DLCO 93%    Dr Gretta Cool Reflux Symptom Index (> 13-15 suggestive of LPR cough)  07/26/2012   Hoarseness of problem with voice 3  Clearing  Of Throat 4   Excess throat mucus  3  Difficulty swallowing food, liquid or tablets 0  Cough after eating or lying down 0  Breathing difficulties or choking episodes 0  Troublesome or annoying cough 2 but can be worse with weather  Sensation of something sticking in throat or lump in throat 4  Heartburn, chest pain, indigestion, or stomach acid coming up 0  TOTAL 16     Kouffman Reflux v Neurogenic Cough Differentiator Reflux  Do you awaken from a sound sleep coughing violently?                            With trouble breathing? n  Do you have choking episodes when you cannot  Get enough air, gasping for air ?              n  Do you usually cough when you lie down into  The bed, or when you just lie down to rest ?                          n  Do you usually cough after meals or eating?         n  Do you cough when (or after) you bend over?    n  GERD SCORE  0  Kouffman Reflux v Neurogenic Cough Differentiator Neurogenic  Do you more-or-less cough all day long? yes  Does change of temperature make you cough? n  Does laughing or chuckling cause you to cough? n  Do fumes (perfume, automobile fumes, burned  Toast, etc.,) cause you to cough ?      n  Does speaking, singing, or talking on the phone cause you to cough   ?               yes  Neurogenic/Airway score 2     Past, Family, Social reviewed: no change since last visit   Review of Systems  Constitutional: Negative for fever and unexpected weight change.  HENT: Negative for ear pain, nosebleeds, congestion, sore throat, rhinorrhea, sneezing, trouble swallowing, dental problem, postnasal drip and sinus pressure.   Eyes: Negative for redness and itching.  Respiratory: Positive for cough. Negative for chest tightness, shortness of breath and wheezing.   Cardiovascular: Negative for palpitations and leg swelling.  Gastrointestinal: Negative for nausea and vomiting.  Genitourinary: Negative for dysuria.  Musculoskeletal: Negative for joint  swelling.  Skin: Negative for rash.  Neurological: Negative for headaches.  Hematological: Does not bruise/bleed easily.  Psychiatric/Behavioral: Negative for dysphoric mood. The patient is not nervous/anxious.        Objective:   Physical Exam Vitals reviewed. Constitutional: She is oriented to person, place, and time. She appears well-developed and well-nourished. No distress.       Body mass index is 25.56 kg/(m^2).  Not coughing HENT:  Head: Normocephalic and atraumatic.  Right Ear: External ear normal.  Left Ear: External ear normal.  Mouth/Throat: Oropharynx is clear and moist. No oropharyngeal exudate.        Eyes: Conjunctivae normal and EOM are normal. Pupils are equal, round, and reactive to light. Right eye exhibits no discharge. Left eye exhibits no discharge. No scleral icterus.  Neck: Normal range of motion. Neck supple. No JVD present. No tracheal deviation present. No thyromegaly present.  Cardiovascular: Normal rate, regular rhythm, normal heart sounds and intact distal pulses.  Exam reveals no gallop and no friction rub.   No murmur heard. Pulmonary/Chest: Effort normal and breath sounds normal. No respiratory distress. She has no wheezes. She has no rales. She exhibits no tenderness.  Abdominal: Soft. Bowel sounds are normal. She exhibits no distension and no mass. There is no tenderness. There is no rebound and no guarding.  Musculoskeletal: Normal range of motion. She exhibits no edema and no tenderness.  Lymphadenopathy:    She has no cervical adenopathy.  Neurological: She is alert and oriented to person, place, and time. She has normal reflexes. No cranial nerve deficit. She exhibits normal muscle tone. Coordination normal.  Skin: Skin is warm and dry. No rash noted. She is not diaphoretic. No erythema. No pallor.  Psychiatric: She has a normal mood and affect. Her behavior is normal. Judgment and thought content normal.       More cheerful      Assessment  & Plan:

## 2012-10-11 NOTE — Patient Instructions (Addendum)
Glad you are much better Continue to control acid reflux You can even try voice rest for few days if cough returns As discussed and agreed, we are holding off further testing or treatment interventions Call [phone] and return if cough becomes a problem  

## 2012-10-11 NOTE — Progress Notes (Signed)
PFT done today. 

## 2012-10-16 ENCOUNTER — Encounter (HOSPITAL_COMMUNITY)
Admission: RE | Admit: 2012-10-16 | Discharge: 2012-10-16 | Disposition: A | Discharge status: Home / Self Care | Payer: BC Managed Care – PPO | Source: Ambulatory Visit | Attending: Gastroenterology | Admitting: Gastroenterology

## 2012-10-16 DIAGNOSIS — R110 Nausea: Secondary | ICD-10-CM | POA: Insufficient documentation

## 2012-10-16 MED ORDER — TECHNETIUM TC 99M SULFUR COLLOID
2.2000 | Freq: Once | INTRAVENOUS | Status: AC | PRN
  Administered 2012-10-16: 2.2 via INTRAVENOUS

## 2012-10-17 ENCOUNTER — Telehealth: Payer: Self-pay | Admitting: *Deleted

## 2012-10-17 NOTE — Telephone Encounter (Signed)
Opened in error

## 2012-10-17 NOTE — Telephone Encounter (Signed)
Notes Recorded by Linna Hoff, RN on 10/17/2012 at 9:06 AM lmom for pt to call back. Yes, her EM is scheduled for 12//23/13. ------  Notes Recorded by Mardella Layman, MD on 10/16/2012 at 11:39 AM Normal gastric emptying study. Did she have manometry performed?  Spoke with pt to inform her of normal GES and reminded her of her appt for EM on 10/21/12. Pt and I spoke a long time about all her procedures being NORMAL and should she have the Manometry done at all. She reports she is some, a lot better; when she 1st came she had been sick for 7 weeks straight. She reports that now, she seems to have the GI discomfort mainly when she overeats. We talked about her stress at work how how it can affect body systems. She agreed to cancel the EM, but she knows she may call back to r/s if she feels she needs to or has a worsening of her condition. Cancelled EM with Noreene Larsson at [facility].

## 2012-10-21 ENCOUNTER — Ambulatory Visit (HOSPITAL_COMMUNITY)
Admission: RE | Admit: 2012-10-21 | Payer: BC Managed Care – PPO | Source: Ambulatory Visit | Admitting: Gastroenterology

## 2012-10-21 ENCOUNTER — Encounter (HOSPITAL_COMMUNITY): Admission: RE | Payer: Self-pay | Source: Ambulatory Visit

## 2012-10-21 SURGERY — MANOMETRY, ESOPHAGUS

## 2012-10-24 ENCOUNTER — Encounter: Payer: Self-pay | Admitting: Internal Medicine

## 2012-10-26 NOTE — Assessment & Plan Note (Signed)
Glad you are much better Continue to control acid reflux You can even try voice rest for few days if cough returns As discussed and agreed, we are holding off further testing or treatment interventions Call [phone] and return if cough becomes a problem

## 2013-03-19 ENCOUNTER — Telehealth: Payer: Self-pay | Admitting: *Deleted

## 2013-03-19 NOTE — Telephone Encounter (Signed)
OFFICE RECEIVED LETTER IN MAIL FROM [facility] STATING PT NEVER CAME BACK FOR HER 6 MONTH FOLLOW UP. PT SAID SHE HAS BEEN BUSY AND FORGET TO CALL THEM, PT AGREED TO CALL [facility] TODAY .

## 2013-05-20 ENCOUNTER — Encounter: Payer: Self-pay | Admitting: Endocrinology

## 2013-05-20 ENCOUNTER — Ambulatory Visit (INDEPENDENT_AMBULATORY_CARE_PROVIDER_SITE_OTHER): Payer: Medicare Other | Admitting: Endocrinology

## 2013-05-20 VITALS — BP 110/60 | HR 67 | Temp 98.4°F | Ht 65.0 in | Wt 155.0 lb

## 2013-05-20 DIAGNOSIS — E042 Nontoxic multinodular goiter: Secondary | ICD-10-CM

## 2013-05-20 NOTE — Patient Instructions (Signed)
blood tests are being requested for you today.  We'll contact you with results. Please return in 1 year. most of the time, a "lumpy thyroid" will eventually become overactive again.  this is usually a slow process, happening over the span of many years.   

## 2013-05-20 NOTE — Progress Notes (Signed)
Subjective:    Patient ID: Cynthia Thornton, female    DOB: 19-Oct-1946, 67 y.o.   MRN: [id number]  HPI pt has h/o hyperthyroidism due to a multinodular goiter (dx'ed 2009; [place] in 2013 showed multiple small nodules; she was given thionamide rx for a while in 2010, but she went off due to normal tft, and hasn't needed any since).  she does not notice the goiter.  pt reports hair loss and insomnia.   Past Medical History  Diagnosis Date  . Osteopenia   . DUB (dysfunctional uterine bleeding)   . Arthritis   . Hiatal hernia 08/15/2006    EGD  . Esophageal reflux 08/15/2006    EGD(Chronic)  . Hyperthyroidism   . Esophageal stricture 08/15/2006    EGD  . Personal history of colonic polyps 09/06/2001    hyperplastic  . Pityriasis rosea     Past Surgical History  Procedure Laterality Date  . Vaginal hysterectomy  1991    Right ovarian cystectomy  . Tonsillectomy    . Breast surgery  2000    Breast cyst removed, left    History   Social History  . Marital Status: Divorced    Spouse Name: N/A    Number of Children: 2  . Years of Education: N/A   Occupational History  . teacher [employer]  . retired Scientist, research (life sciences)    Social History Main Topics  . Smoking status: Former Smoker -- 0.20 packs/day for 10 years    Types: Cigarettes    Quit date: 10/31/1995  . Smokeless tobacco: Never Used  . Alcohol Use: 0.0 oz/week     Comment: Rare  . Drug Use: No  . Sexually Active: No   Other Topics Concern  . Not on file   Social History Narrative  . No narrative on file    Current Outpatient Prescriptions on File Prior to Visit  Medication Sig Dispense Refill  . b complex vitamins capsule Take 1 capsule by mouth daily.        . betamethasone, augmented, (DIPROLENE) 0.05 % lotion Apply topically as needed.  30 mL  6  . Cholecalciferol (VITAMIN D PO) Take 2,000 Units by mouth.        . Clorazepate Dipotassium (TRANXENE-SD PO) Take 7.5 mg by mouth as needed.       Marland Kitchen  esomeprazole (NEXIUM) 40 MG capsule Take 1 capsule (40 mg total) by mouth 2 (two) times daily.  60 capsule  11   No current facility-administered medications on file prior to visit.    Allergies  Allergen Reactions  . Codeine Nausea Only  . Propranolol Hcl     REACTION: DIZZY    Family History  Problem Relation Age of Onset  . Osteoporosis Mother   . Lung cancer Mother   . Stroke Mother   . Diabetes Father   . Hypertension Father   . Osteoporosis Father   . Colon cancer Father 51  . Breast cancer Sister     Age 81  . Ovarian cancer Maternal Aunt     great  . Breast cancer Cousin     maternal-Age 76  . Breast cancer Maternal Aunt     great aunt- Age unknown  . Colon cancer Paternal Uncle 50  . Colon cancer Cousin     maternal  . Crohn's disease Son     BP 110/60  Pulse 67  Temp(Src) 98.4 F (36.9 C) (Oral)  Ht 5\' 5"  (1.651 m)  Wt 155 lb (70.308 kg)  BMI 25.79 kg/m2  SpO2 98%    Review of Systems She reports weight gain    Objective:   Physical Exam VITAL SIGNS:  See vs page GENERAL: no distress NECK: There is no palpable thyroid enlargement.  No thyroid nodule is palpable.  No palpable lymphadenopathy at the anterior neck.  Lab Results  Component Value Date   TSH 1.82 05/20/2013   T4TOTAL 8.5 08/31/2011      Assessment & Plan:  Hyperthyroidism: has not yet recurred off the tapazole

## 2013-05-26 ENCOUNTER — Telehealth: Payer: Self-pay | Admitting: *Deleted

## 2013-05-26 NOTE — Telephone Encounter (Signed)
Pt returned call requesting lab results. Advised pt that there were in normal range and Dr Everardo All stated he would see her in 1 yr. Pt understood.

## 2013-08-20 ENCOUNTER — Other Ambulatory Visit: Payer: Self-pay | Admitting: Gynecology

## 2013-08-20 ENCOUNTER — Telehealth: Payer: Self-pay

## 2013-08-20 NOTE — Telephone Encounter (Signed)
Former Dr. Reece Agar patient who is planning to see you for CE in Dec 2014 when due. Abnormal mammo last Sept. Had 3-D and "BSGI". Was told to return in 6 mos for follow-up but did not because was still fighting with ins co trying to get them to pay for the 3-D mammo from Sept.  When she called in Sept to schedule [facility] said they recommended she have 3-D mammo because her breasts are "small and dense".  She said she told them of the troubles she had fighting with ins co to get them to pay previously.  The receptionist told her she needed to have her doctor's office order the 3-D and she would not have a problem.  I did explain to patient that a Dr. Duanne Moron a test certainly does not guarantee her ins will pay for it. Her ins plan will dictate that.  Ok to order this for patient?

## 2013-08-20 NOTE — Telephone Encounter (Signed)
#  1 okay to order a stress to the patient does not guarantee payment by insurance #2 regardless of whether insurance pays for that not if that is the recommended test she should have this done

## 2013-08-20 NOTE — Telephone Encounter (Signed)
Patient informed of all this. I told her to wait until Friday to call to schedule and Victorino Dike would put order in tomorrow for that.

## 2013-08-21 NOTE — Telephone Encounter (Signed)
I called pt and told her order is not needed for 3-D mammogram, per [facility] pt will have to pay out of pocket $50. I called pt and explained this to her as well, I told her only diag. Mammograms/ ultrasound needs orders. Pt wants 3-d and will have this scheduled.

## 2013-08-29 ENCOUNTER — Encounter: Payer: Self-pay | Admitting: Gynecology

## 2013-09-01 ENCOUNTER — Other Ambulatory Visit: Payer: Self-pay | Admitting: *Deleted

## 2013-09-01 DIAGNOSIS — N6489 Other specified disorders of breast: Secondary | ICD-10-CM

## 2013-09-23 ENCOUNTER — Ambulatory Visit (INDEPENDENT_AMBULATORY_CARE_PROVIDER_SITE_OTHER): Payer: Medicare Other | Admitting: Internal Medicine

## 2013-09-23 ENCOUNTER — Encounter: Payer: Self-pay | Admitting: Internal Medicine

## 2013-09-23 ENCOUNTER — Ambulatory Visit (HOSPITAL_BASED_OUTPATIENT_CLINIC_OR_DEPARTMENT_OTHER)
Admission: RE | Admit: 2013-09-23 | Discharge: 2013-09-23 | Disposition: A | Discharge status: Home / Self Care | Payer: Medicare Other | Source: Ambulatory Visit | Attending: Internal Medicine | Admitting: Internal Medicine

## 2013-09-23 VITALS — BP 134/75 | HR 88 | Temp 98.1°F | Wt 157.0 lb

## 2013-09-23 DIAGNOSIS — R05 Cough: Secondary | ICD-10-CM | POA: Insufficient documentation

## 2013-09-23 DIAGNOSIS — R042 Hemoptysis: Secondary | ICD-10-CM | POA: Insufficient documentation

## 2013-09-23 DIAGNOSIS — R059 Cough, unspecified: Secondary | ICD-10-CM

## 2013-09-23 NOTE — Patient Instructions (Signed)
For cough suppression use Mucinex DM OTC 1 tablet twice a day, if you need something stronger please let us know.  If you have another episodes of coughing up blood call immediately, if the episodes severe go to the ER   Get the XR at [facility], corner of [address] (10 minutes form here); they are open 24/7 [address]  [place], [place] [address] [phone]  Schedule a physical at your convenience

## 2013-09-23 NOTE — Progress Notes (Signed)
  Subjective:    Patient ID: Cynthia Thornton, female    DOB: Nov 15, 1945, 67 y.o.   MRN: [id number]  HPI Acute visit, here with her daughter-in-law who is a Engineer, civil (consulting). Chief complaint today is hemoptysis: Had 2 episodes of sudden deep cough associated with hemoptysis described as ~1 or 2 cc of bright red blood with minimal mucus. The 2 episodes happened today in the morning, no episodes this afternoon. On further questioning, she has a history of cough that never really cleared since last year when she saw pulmonary, the cough is mild, not associated with sputum, "almost like I need to clear my throat constantly "  Past Medical History: Hyperthyroidism + Goiter Sees Dr Everardo All GERD, HH , esophageal stricture--(EGD 2005 and 10-07 GERD/stricture) COLONIC POLYPS, BENIGN DJD LOW BACK PAIN SYNDROME   SYMPTOM, HEADACHE   Family history of strokes, on aspirin  Past Surgical History: Hysterectomy for heavy menses & anemia No oophorectomy   Tubal ligation Tonsillectomy   Family History: Breast Cancer: Sister, Aunts, Cousin--all maternal Lung cancer -- M Colon Cancer: Father 33 yo, Pat. Uncle 46 yo, cousin Ovarian Cancer: Mat. Aunt Colitis/Crohn's: Son Diabetes:  Father stroke-- mother , GM, aunts      Social History: Divorced, 1 boy, 1 girl, lives by herself Occupation: Government social research officer @ [employer] Quit tobacco-- in the 90s,  2 cigarrets a day but heavy exposure to smoke   Alcohol Use - socially   Illicit Drug Use - no   Review of Systems Denies fever or chills No recent URI symptoms specifically no sore throat or runny nose. Did have mild frontal pressure for the last couple of days without nasal discharge. No wheezing  No weight loss Essentially no GERD symptoms, very rarely has heartburn.    Objective:   Physical Exam BP 134/75  Pulse 88  Temp(Src) 98.1 F (36.7 C)  Wt 157 lb (71.215 kg)  SpO2 100% General -- alert, well-developed, NAD. Healthy appearing lady  .  Neck  --no thyromegaly , no mass or  LAD; No supraclavicular mass  Lungs -- normal respiratory effort, no intercostal retractions, no accessory muscle use, and normal breath sounds.  Heart-- normal rate, regular rhythm, no murmur.  Neurologic--  alert & oriented X3. Speech normal, gait normal, strength normal in all extremities.  Psych-- Cognition and judgment appear intact. Cooperative with normal attention span and concentration. No anxious appearing , no depressed appearing.      Assessment & Plan:

## 2013-09-23 NOTE — Progress Notes (Signed)
Pre visit review using our clinic review tool, if applicable. No additional management support is needed unless otherwise documented below in the visit note. 

## 2013-09-23 NOTE — Assessment & Plan Note (Addendum)
Persisting cough now associated with 2 episodes of hemoptysis is not in the context of a  recent upper respiratory infection. I am somehow concerned about her symptoms, plan is to get a chest x-ray and refer back to pulmonary Repeat a CT chest? Bronchoscopy?

## 2013-09-24 ENCOUNTER — Encounter: Payer: Self-pay | Admitting: Internal Medicine

## 2013-09-24 ENCOUNTER — Ambulatory Visit (INDEPENDENT_AMBULATORY_CARE_PROVIDER_SITE_OTHER): Payer: Medicare Other | Admitting: Internal Medicine

## 2013-09-24 ENCOUNTER — Telehealth: Payer: Self-pay | Admitting: *Deleted

## 2013-09-24 VITALS — BP 110/64 | HR 79 | Temp 98.2°F | Ht 64.25 in | Wt 155.0 lb

## 2013-09-24 DIAGNOSIS — R05 Cough: Secondary | ICD-10-CM

## 2013-09-24 DIAGNOSIS — R053 Chronic cough: Secondary | ICD-10-CM

## 2013-09-24 DIAGNOSIS — R059 Cough, unspecified: Secondary | ICD-10-CM

## 2013-09-24 DIAGNOSIS — K219 Gastro-esophageal reflux disease without esophagitis: Secondary | ICD-10-CM

## 2013-09-24 MED ORDER — FAMOTIDINE 20 MG PO TABS: ORAL_TABLET | ORAL | Status: DC

## 2013-09-24 MED ORDER — ESOMEPRAZOLE MAGNESIUM 40 MG PO CPDR: DELAYED_RELEASE_CAPSULE | ORAL | Status: DC

## 2013-09-24 MED ORDER — AZITHROMYCIN 250 MG PO TABS: ORAL_TABLET | ORAL | Status: DC

## 2013-09-24 NOTE — Telephone Encounter (Signed)
Message copied by Eustace Quail on Wed Sep 24, 2013 11:54 AM ------      Message from: Wanda Plump      Created: Tue Sep 23, 2013  4:51 PM       Advise patient, chest x-rays is ok, plan is the same ------

## 2013-09-24 NOTE — Telephone Encounter (Addendum)
Had cough today w/ a very small amount of blood and clear sputum ( very different from yesterday) No fever, chills, chest pain or shortness or breath. After all she may have bronchitis, will call in Z-Pak, patient will call if symptoms increase. Pulmonary eval pending

## 2013-09-24 NOTE — Patient Instructions (Addendum)
nexium 40 mg Take 30-60 min before first meal of the day and Pepcid 20 mg at bedtime   Bronchiectasis =   you have scarring of your bronchial tubes which means that they don't function perfectly normally and mucus tends to pool in certain areas of your lung which can cause pneumonia and further scarring of your lung and bronchial tubes.  Whenever you develop cough congestion take mucinex or mucinex dm (max dose is 1200 mg every 12 hours  > these will help keep the mucus loose and flowing but if your condition worsens you need to seek help immediately preferably here or somewhere inside the [facility] system to compare xrays ( worse = darker or bloody mucus or pain on breathing in)   Please see patient coordinator before you leave today  to schedule sinus CT next week  Please schedule a follow up office visit in 2 weeks, sooner if needed to see [facility]

## 2013-09-24 NOTE — Progress Notes (Signed)
Subjective:    Patient ID: Cynthia Thornton, female    DOB: [birth year], 67 y.o.   MRN: [id number]  HPI PCP is Cynthia Ora, MD    reports that she quit [phone] - Her smoking use included Cigarettes. She has a 2 pack-year smoking history. She has never used smokeless tobacco.   IOV 07/26/2012   67 year old female. 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  Reports chronic cough x 7 months ([facility] 2013). Insidious onset. Does not remember preceding URI prior to onset of cough.   There is always associated hoarseness of voice,  clearing of throat, mucus in throat that is small in amount and she has difficulty getting it out (is creamy in color) which is all progressive.  No associated tickle in throat or sinus drainage. Talking makes symptoms worse. Says giving hx to me in office makes her voice hoarse. End of day makes her worse due to talking. SHe is frustrated that because talking is her job and talking make symptoms worse and she is unable to execute her job.   GERD hx  Has had GERD since 2003 and on nexium since. In July 2013 took medical help for cough and referred to  ENT eval: per her hx they told her it was GERD related cough but shey says that GERD symptoms are different from current cough. In terms of diet: drink tea, drinks diet pepsi 2-4 cans per day, but avoids pizza, fried food. Will have cheese on salad. Has only occasional chocolate. Eats bran cereal a day. Lunch is fruit and soup.   SINUS hx Denies post nasal sinus drainage. Denies tickle down throat ENT eval per hx in July 2013: normal sinuses. Trial of flonase July-August 2013: tried just one squirt and she did not like the sensation. So never used it.   ACE INHIBITOR Denies associated ACE inhibitor intake   SMOKING  Hx  1-3 cigs per day. Quit 16 years ago at age 79. Started smoking age 62.   FAm Hx  - mom died of lung cancer and had cough. Patient very concerned that cough is due to lung  cancer. I believe she feels guilty she might have missed her mom's cancer when her mom complained of chronic cough. She prefers upfront CT chest   CXR 05/30/12 1. No acute cardiopulmonary disease.  2. Hyperexpanded lungs with minimal linear heterogeneous opacities  within the right middle lobe and lingula, nonspecific possibly  atelectasis or scar, though chronic atypical infection (MAI) may  have a similar appearance.  Original Report Authenticated By: Waynard Reeds, M.D.  REC Please have CT chest without contrast  Please have breathing test called PFT  Return to see me after above  OV 10/11/2012 Fu cough  -Since last visit, without interventions , cough has improved 40%. She says drier weather was the reason for improvement. Says, more than cough, clearing of throat is the problem. REsults below of CT and PFT reviewed with patient. AT this stage, she feels she wants expectant Rx only. Not interested in further testing or intervention  09/24/2013 f/u ov/Cynthia Thornton re:  Chief Complaint  Patient presents with  . Acute Visit    Pt c/o hemoptysis x 2 days- She states that this is the first time she has had this problem. No change in her breathing or other co's.    states she was not aware of dx of bronchiectasis or implications re risk of hemoptysis. Sputum did not  really turn purulent before blood but the total amt is < tsp, no assoc epistaxis or sinus complaints  No obvious day to day or daytime variabilty or assoc   cp or chest tightness, subjective wheeze overt sinus or hb symptoms. No unusual exp hx or h/o childhood pna/ asthma or knowledge of premature birth.  Sleeping ok without nocturnal  or early am exacerbation  of respiratory  c/o's or need for noct saba. Also denies any obvious fluctuation of symptoms with weather or environmental changes or other aggravating or alleviating factors except as outlined above   Current Medications, Allergies, Complete Past Medical History, Past Surgical  History, Family History, and Social History were reviewed in [employer] record.  ROS  The following are not active complaints unless bolded sore throat, dysphagia, dental problems, itching, sneezing,  nasal congestion or excess/ purulent secretions, ear ache,   fever, chills, sweats, unintended wt loss, pleuritic or exertional cp, hemoptysis,  orthopnea pnd or leg swelling, presyncope, palpitations, heartburn, abdominal pain, anorexia, nausea, vomiting, diarrhea  or change in bowel or urinary habits, change in stools or urine, dysuria,hematuria,  rash, arthralgias, visual complaints, headache, numbness weakness or ataxia or problems with walking or coordination,  change in mood/affect or memory.              Objective:   Physical Exam   Pleasant amb wf nad Wt Readings from Last 3 Encounters:  09/24/13 155 lb (70.308 kg)  09/23/13 157 lb (71.215 kg)  05/20/13 155 lb (70.308 kg)      HEENT: nl dentition, turbinates, and orophanx. Nl external ear canals without cough reflex   NECK :  without JVD/Nodes/TM/ nl carotid upstrokes bilaterally   LUNGS: no acc muscle use, clear to A and P bilaterally without cough on insp or exp maneuvers   CV:  RRR  no s3 or murmur or increase in P2, no edema   ABD:  soft and nontender with nl excursion in the supine position. No bruits or organomegaly, bowel sounds nl  MS:  warm without deformities, calf tenderness, cyanosis or clubbing  SKIN: warm and dry without lesions    NEURO:  alert, approp, no deficits   CT 08/02/12  - IMPRESSION:  1. No acute cardiopulmonary abnormalities.  2. Lingular bronchiectasis  3. There are two clustered areas of nodularity within the  periphery of both lungs. These do not have worrisome features and  likely the sequela of atypical infection    PFT  10/11/12 - normal (reviewd) fev1 2.28L/101%, TLC 111%, DLCO 93%  cxr [id number]  The cardiac shadow is stable. Bilateral nipple shadows are  seen.  Some interstitial changes are noted stable from the prior exam. No  focal infiltrate is seen.       Assessment & Plan:

## 2013-09-24 NOTE — Addendum Note (Signed)
Addended by: Willow Ora E on: 09/24/2013 01:12 PM   Modules accepted: Orders

## 2013-09-24 NOTE — Telephone Encounter (Signed)
Pt notified of xray result. Pt also would like for Dr. Drue Novel to know that she had a episodes this morning about 3-4 times of coughing up a little blood mucus. DJR

## 2013-09-25 NOTE — Assessment & Plan Note (Signed)
See CT chest  08/02/13 :  Lingular bronchiectasis   I had an extended discussion with the patient today lasting 15 to 20 minutes of a 25 minute visit on the following issues:  Pathophysiology and complications (recurrent low grade infections and hemoptysis) reviewed.  Zpak reasonable to start with since don't suspect resistant bugs in her setting  Rec complete the w/u with sinus CT to be complete    Each maintenance medication was reviewed in detail including most importantly the difference between maintenance and as needed and under what circumstances the prns are to be used.  Please see instructions for details which were reviewed in writing and the patient given a copy.

## 2013-09-25 NOTE — Assessment & Plan Note (Signed)
Reviewed need in chronic cough to pay attention to GERD diet and max gerd rx at least while actively coughing to prevent secondary GERD/ cyclical cough from developing  See instructions for specific recommendations which were reviewed directly with the patient who was given a copy with highlighter outlining the key components.

## 2013-09-30 ENCOUNTER — Encounter: Payer: Self-pay | Admitting: Internal Medicine

## 2013-09-30 ENCOUNTER — Ambulatory Visit (INDEPENDENT_AMBULATORY_CARE_PROVIDER_SITE_OTHER)
Admission: RE | Admit: 2013-09-30 | Discharge: 2013-09-30 | Disposition: A | Discharge status: Home / Self Care | Payer: Medicare Other | Source: Ambulatory Visit | Attending: Internal Medicine | Admitting: Internal Medicine

## 2013-09-30 DIAGNOSIS — R053 Chronic cough: Secondary | ICD-10-CM

## 2013-09-30 DIAGNOSIS — R05 Cough: Secondary | ICD-10-CM

## 2013-09-30 DIAGNOSIS — R059 Cough, unspecified: Secondary | ICD-10-CM

## 2013-10-01 ENCOUNTER — Telehealth: Payer: Self-pay | Admitting: Internal Medicine

## 2013-10-01 NOTE — Progress Notes (Signed)
Quick Note:  LMTCB ______ 

## 2013-10-01 NOTE — Telephone Encounter (Signed)
Pt aware of her CT results per MW.  She has no further questions or needs at this time. Nothing further is needed

## 2013-10-10 ENCOUNTER — Ambulatory Visit: Payer: Self-pay | Admitting: Internal Medicine

## 2013-10-10 ENCOUNTER — Ambulatory Visit (INDEPENDENT_AMBULATORY_CARE_PROVIDER_SITE_OTHER): Payer: Medicare Other | Admitting: Internal Medicine

## 2013-10-10 ENCOUNTER — Encounter: Payer: Self-pay | Admitting: Internal Medicine

## 2013-10-10 VITALS — BP 122/80 | HR 70 | Ht 64.0 in | Wt 160.4 lb

## 2013-10-10 DIAGNOSIS — R05 Cough: Secondary | ICD-10-CM

## 2013-10-10 DIAGNOSIS — R059 Cough, unspecified: Secondary | ICD-10-CM

## 2013-10-10 DIAGNOSIS — Z23 Encounter for immunization: Secondary | ICD-10-CM

## 2013-10-10 MED ORDER — GABAPENTIN 300 MG PO CAPS: ORAL_CAPSULE | ORAL | Status: DC

## 2013-10-10 MED ORDER — FLUTICASONE PROPIONATE 50 MCG/ACT NA SUSP: 2.0000 | Freq: Every day | NASAL | Status: DC

## 2013-10-10 NOTE — Progress Notes (Signed)
Subjective:    Patient ID: Cynthia Thornton, female    DOB: 1946/05/25, 67 y.o.   MRN: [id number]  HPI  PCP is Kathlene November, MD    reports that she quit [phone] - Her smoking use included Cigarettes. She has a 2 pack-year smoking history. She has never used smokeless tobacco.   IOV 07/26/2012   67 year old female. 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  Reports chronic cough x 7 months ([facility] 2013). Insidious onset. Does not remember preceding URI prior to onset of cough.   There is always associated hoarseness of voice,  clearing of throat, mucus in throat that is small in amount and she has difficulty getting it out (is creamy in color) which is all progressive.  No associated tickle in throat or sinus drainage. Talking makes symptoms worse. Says giving hx to me in office makes her voice hoarse. End of day makes her worse due to talking. SHe is frustrated that because talking is her job and talking make symptoms worse and she is unable to execute her job.   GERD hx  Has had GERD since 2003 and on nexium since. In July 2013 took medical help for cough and referred to  ENT eval: per her hx they told her it was GERD related cough but shey says that GERD symptoms are different from current cough. In terms of diet: drink tea, drinks diet pepsi 2-4 cans per day, but avoids pizza, fried food. Will have cheese on salad. Has only occasional chocolate. Eats bran cereal a day. Lunch is fruit and soup.   SINUS hx Denies post nasal sinus drainage. Denies tickle down throat ENT eval per hx in July 2013: normal sinuses. Trial of flonase July-August 2013: tried just one squirt and she did not like the sensation. So never used it.   ACE INHIBITOR Denies associated ACE inhibitor intake   SMOKING  Hx  1-3 cigs per day. Quit 16 years ago at age 30. Started smoking age 89.   FAm Hx  - mom died of lung cancer and had cough. Patient very concerned that cough is due to lung  cancer. I believe she feels guilty she might have missed her mom's cancer when her mom complained of chronic cough. She prefers upfront CT chest   CXR 05/30/12 1. No acute cardiopulmonary disease.  2. Hyperexpanded lungs with minimal linear heterogeneous opacities  within the right middle lobe and lingula, nonspecific possibly  atelectasis or scar, though chronic atypical infection (MAI) may  have a similar appearance.  Original Report Authenticated By: Rachel Moulds, M.D.  REC Please have CT chest without contrast  Please have breathing test called PFT  Return to see me after above     OV 10/11/2012 Fu cough  -Since last visit, without interventions , cough has improved 40%. She says drier weather was the reason for improvement. Says, more than cough, clearing of throat is the problem. REsults below of CT and PFT reviewed with patient. AT this stage, she feels she wants expectant Rx only. Not interested in further testing or intervention   PFT - normal   IMPRESSION:  CT chest 08/02/12 1. No acute cardiopulmonary abnormalities.  2. Lingular bronchiectasis  3. There are two clustered areas of nodularity within the  periphery of both lungs. These do not have worrisome features and  likely the sequela of atypical infection.  4. Liver and splenic hypodensities were present on the exam  from  10/07/2004 and likely reflect a combination of benign liver cyst  and hemangiomas.  Original Report Authenticated By: Rosealee Albee, M.D.  Glad you are much better Continue to control acid reflux You can even try voice rest for few days if cough returns As discussed and agreed, we are holding off further testing or treatment interventions Call [phone] and return if cough becomes a problem   09/24/2013 f/u ov/Wert re:  Chief Complaint  Patient presents with  . Acute Visit    Pt c/o hemoptysis x 2 days- She states that this is the first time she has had this problem. No change in her  breathing or other co's.    states she was not aware of dx of bronchiectasis or implications re risk of hemoptysis. Sputum did not really turn purulent before blood but the total amt is < tsp, no assoc epistaxis or sinus complaints  No obvious day to day or daytime variabilty or assoc   cp or chest tightness, subjective wheeze overt sinus or hb symptoms. No unusual exp hx or h/o childhood pna/ asthma or knowledge of premature birth.  Sleeping ok without nocturnal  or early am exacerbation  of respiratory  c/o's or need for noct saba. Also denies any obvious fluctuation of symptoms with weather or environmental changes or other aggravating or alleviating factors except as outlined above   IMPRESSION: CTG sinus 09/30/13:  Mild mucosal thickening base of left maxillary sinus. No air-fluid  level.  Electronically Signed  By: Marlan Palau M.D.  On: 09/30/2013 16:15  REC Nexium + pepcid   OV 10/10/2013  Chief Complaint  Patient presents with  . Cough    follow-up.  Pt states she has not coughed up any more blood. She states cough is improved, but she does c/o having a "stuffy nose" all day.     Followup chronic cough; multifactorial in the setting of lingular bronchiectasis  Cough never really went away. I am seeing her after a year. A month ago she did see Dr. Casimiro Needle wert having acute onset of hemoptysis one time. Chest x-ray was unremarkable and CT scan of the sinus showed mild sinus thickening but otherwise unremarkable. It was felt that the hemoptysis was from her lingular bronchiectasis. However, she did not have any infection at that time. Currently cough persist. She points to her throat. She clears her throat all the time. Cough is laryngeal and quality. Mostly dry but occasionally she does bring mucus. She is compliant with her antihistamine and Nexium and Pepcid. RSI cough score is 10  This  Time she is open to Rx and getting rid of cough.,    Dr Gretta Cool Reflux Symptom Index (>  13-15 suggestive of LPR cough) 0 -> 5  =  none ->severe problem 10/10/2013   Hoarseness of problem with voice 0  Clearing  Of Throat 4  Excess throat mucus or feeling of post nasal drip 3  Difficulty swallowing food, liquid or tablets 0  Cough after eating or lying down 0  Breathing difficulties or choking episodes 0  Troublesome or annoying cough 3  Sensation of something sticking in throat or lump in throat 0  Heartburn, chest pain, indigestion, or stomach acid coming up 0  TOTAL 10    Review of Systems  Constitutional: Negative for fever and unexpected weight change.  HENT: Positive for congestion. Negative for dental problem, ear pain, nosebleeds, postnasal drip, rhinorrhea, sinus pressure, sneezing, sore throat and trouble swallowing.  Eyes: Negative for redness and itching.  Respiratory: Positive for cough. Negative for chest tightness, shortness of breath and wheezing.   Cardiovascular: Negative for palpitations and leg swelling.  Gastrointestinal: Negative for nausea and vomiting.  Genitourinary: Negative for dysuria.  Musculoskeletal: Negative for joint swelling.  Skin: Negative for rash.  Neurological: Negative for headaches.  Hematological: Does not bruise/bleed easily.  Psychiatric/Behavioral: Negative for dysphoric mood. The patient is not nervous/anxious.        Objective:   Physical Exam  Vitals reviewed. Constitutional: She is oriented to person, place, and time. She appears well-developed and well-nourished. No distress.  ffrequently clearing throat Laryngeal cough  HENT:  Head: Normocephalic and atraumatic.  Right Ear: External ear normal.  Left Ear: External ear normal.  Mouth/Throat: Oropharynx is clear and moist. No oropharyngeal exudate.  Eyes: Conjunctivae and EOM are normal. Pupils are equal, round, and reactive to light. Right eye exhibits no discharge. Left eye exhibits no discharge. No scleral icterus.  Neck: Normal range of motion. Neck supple.  No JVD present. No tracheal deviation present. No thyromegaly present.  Cardiovascular: Normal rate, regular rhythm, normal heart sounds and intact distal pulses.  Exam reveals no gallop and no friction rub.   No murmur heard. Pulmonary/Chest: Effort normal and breath sounds normal. No respiratory distress. She has no wheezes. She has no rales. She exhibits no tenderness.  Abdominal: Soft. Bowel sounds are normal. She exhibits no distension and no mass. There is no tenderness. There is no rebound and no guarding.  Musculoskeletal: Normal range of motion. She exhibits no edema and no tenderness.  Lymphadenopathy:    She has no cervical adenopathy.  Neurological: She is alert and oriented to person, place, and time. She has normal reflexes. No cranial nerve deficit. She exhibits normal muscle tone. Coordination normal.  Skin: Skin is warm and dry. No rash noted. She is not diaphoretic. No erythema. No pallor.  Psychiatric: She has a normal mood and affect. Her behavior is normal. Judgment and thought content normal.          Assessment & Plan:

## 2013-10-10 NOTE — Patient Instructions (Signed)
Cough is from possible sinus drainage/allergies, possible acid reflux, possible  asthma,  All of this is working together to cause cyclical cough/LPR cough  #Sinus drainage  - start  Hypertonic saline nasal spray -  take generic fluticasone inhaler 2 squirts each nostril daily - continue anti histamime  # Acid Reflux  -continue nexium and pepcid - avoid colas, spices, cheeses, spirits, red meats, beer, chocolates, fried foods etc.,   - sleep with head end of bed elevated  - eat small frequent meals  - do not go to bed for 3 hours after last meal  # Poissible Asthma   - do methacholine challenge test  #Vocal Cord Dysfunction or Irritable Larynx  - will refer you to speech therapy with Mr Carl Schinke -  please choose 3 days and observe complete voice rest - no talking or whispering  - at all times there  there is urge to cough, drink water or swallow or sip on throat lozenge  - start neurontin 300mg po daily  X 3 days, then 300mg po twice daily x 3 days, then 300mg po three times daily to continue. If this makes you sleepy, cut down on dose but you should continue neurontin at all times  #coughing up blood  - keep any eye on this, if recus wil  Need bronchoscopy  #Health maintenance  - flu shot and prevnar 10/10/2013   #Followup - I will see you in 8 weeks.  - any problems call or come sooner 

## 2013-10-13 NOTE — Assessment & Plan Note (Signed)
Cough is from possible sinus drainage/allergies, possible acid reflux, possible  asthma,  All of this is working together to cause cyclical cough/LPR cough  #Sinus drainage  - start  Hypertonic saline nasal spray -  take generic fluticasone inhaler 2 squirts each nostril daily - continue anti histamime  # Acid Reflux  -continue nexium and pepcid - avoid colas, spices, cheeses, spirits, red meats, beer, chocolates, fried foods etc.,   - sleep with head end of bed elevated  - eat small frequent meals  - do not go to bed for 3 hours after last meal  # Poissible Asthma   - do methacholine challenge test  #Vocal Cord Dysfunction or Irritable Larynx  - will refer you to speech therapy with Mr Verdie Mosher -  please choose 3 days and observe complete voice rest - no talking or whispering  - at all times there  there is urge to cough, drink water or swallow or sip on throat lozenge  - start neurontin 300mg  po daily  X 3 days, then 300mg  po twice daily x 3 days, then 300mg  po three times daily to continue. If this makes you sleepy, cut down on dose but you should continue neurontin at all times  #coughing up blood  - keep any eye on this, if recus wil  Need bronchoscopy  #Health maintenance  - flu shot and prevnar 10/10/2013   #Followup - I will see you in 8 weeks.  - any problems call or come sooner

## 2013-10-16 ENCOUNTER — Encounter: Payer: Self-pay | Admitting: Gynecology

## 2013-10-16 ENCOUNTER — Ambulatory Visit: Payer: Medicare Other

## 2013-10-20 ENCOUNTER — Ambulatory Visit (HOSPITAL_COMMUNITY)
Admission: RE | Admit: 2013-10-20 | Discharge: 2013-10-20 | Disposition: A | Discharge status: Home / Self Care | Payer: Medicare Other | Source: Ambulatory Visit | Attending: Internal Medicine | Admitting: Internal Medicine

## 2013-10-20 DIAGNOSIS — R05 Cough: Secondary | ICD-10-CM | POA: Insufficient documentation

## 2013-10-20 DIAGNOSIS — R059 Cough, unspecified: Secondary | ICD-10-CM

## 2013-10-20 LAB — PULMONARY FUNCTION TEST
FEF 25-75 Post: 2.37 L/sec
FEF2575-%Pred-Pre: 60 %
FEV1-%Change-Post: 16 %
FEV1-Post: 2.62 L
FEV6-%Change-Post: 5 %
FEV6-%Pred-Post: 115 %
FEV6-%Pred-Pre: 108 %
FEV6-Post: 3.49 L
FEV6-Pre: 3.29 L
FEV6FVC-%Change-Post: 1 %
FEV6FVC-%Pred-Post: 104 %
FEV6FVC-%Pred-Pre: 102 %
FVC-%Change-Post: 4 %
FVC-%Pred-Post: 111 %
FVC-%Pred-Pre: 106 %
FVC-Post: 3.49 L
FVC-Pre: 3.34 L
Pre FEV1/FVC ratio: 67 %

## 2013-10-20 MED ORDER — METHACHOLINE 0.0625 MG/ML NEB SOLN
2.0000 mL | Freq: Once | RESPIRATORY_TRACT | Status: AC
  Administered 2013-10-20: 0.125 mg via RESPIRATORY_TRACT

## 2013-10-20 MED ORDER — METHACHOLINE 1 MG/ML NEB SOLN
2.0000 mL | Freq: Once | RESPIRATORY_TRACT | Status: AC
  Administered 2013-10-20: 2 mg via RESPIRATORY_TRACT

## 2013-10-20 MED ORDER — ALBUTEROL SULFATE (5 MG/ML) 0.5% IN NEBU
2.5000 mg | INHALATION_SOLUTION | Freq: Once | RESPIRATORY_TRACT | Status: AC
  Administered 2013-10-20: 2.5 mg via RESPIRATORY_TRACT

## 2013-10-20 MED ORDER — METHACHOLINE 16 MG/ML NEB SOLN
2.0000 mL | Freq: Once | RESPIRATORY_TRACT | Status: AC
  Administered 2013-10-20: 32 mg via RESPIRATORY_TRACT

## 2013-10-20 MED ORDER — SODIUM CHLORIDE 0.9 % IN NEBU
3.0000 mL | INHALATION_SOLUTION | Freq: Once | RESPIRATORY_TRACT | Status: AC
  Administered 2013-10-20: 3 mL via RESPIRATORY_TRACT

## 2013-10-20 MED ORDER — METHACHOLINE 0.25 MG/ML NEB SOLN
2.0000 mL | Freq: Once | RESPIRATORY_TRACT | Status: AC
  Administered 2013-10-20: 0.5 mg via RESPIRATORY_TRACT

## 2013-10-20 MED ORDER — METHACHOLINE 4 MG/ML NEB SOLN
2.0000 mL | Freq: Once | RESPIRATORY_TRACT | Status: AC
  Administered 2013-10-20: 8 mg via RESPIRATORY_TRACT

## 2013-11-07 ENCOUNTER — Ambulatory Visit: Payer: Medicare Other

## 2013-11-17 ENCOUNTER — Encounter: Payer: Self-pay | Admitting: Gynecology

## 2013-11-17 ENCOUNTER — Ambulatory Visit (INDEPENDENT_AMBULATORY_CARE_PROVIDER_SITE_OTHER): Payer: Medicare Other | Admitting: Gynecology

## 2013-11-17 VITALS — BP 118/76 | Ht 64.0 in | Wt 158.0 lb

## 2013-11-17 DIAGNOSIS — K469 Unspecified abdominal hernia without obstruction or gangrene: Secondary | ICD-10-CM

## 2013-11-17 DIAGNOSIS — Z803 Family history of malignant neoplasm of breast: Secondary | ICD-10-CM

## 2013-11-17 DIAGNOSIS — M949 Disorder of cartilage, unspecified: Secondary | ICD-10-CM

## 2013-11-17 DIAGNOSIS — M899 Disorder of bone, unspecified: Secondary | ICD-10-CM

## 2013-11-17 DIAGNOSIS — M8580 Other specified disorders of bone density and structure, unspecified site: Secondary | ICD-10-CM

## 2013-11-17 DIAGNOSIS — N952 Postmenopausal atrophic vaginitis: Secondary | ICD-10-CM

## 2013-11-17 NOTE — Progress Notes (Signed)
Cynthia Thornton Nov 09, 1945 [id number]        68 y.o.  [id number] for followup exam.  Former patient of Dr. Cherylann Banas. Several issues noted below.  Past medical history,surgical history, problem list, medications, allergies, family history and social history were all reviewed and documented in the EPIC chart.  ROS:  Performed and pertinent positives and negatives are included in the history, assessment and plan .  Exam: [name] assistant Filed Vitals:   11/17/13 1546  BP: 118/76  Height: _0  (1.626 m)  Weight: 158 lb (71.668 kg)   General appearance  Normal Skin grossly normal Head/Neck normal with no cervical or supraclavicular adenopathy thyroid normal Lungs  clear Cardiac RR, without RMG Abdominal  soft, nontender, without masses, organomegaly or hernia Breasts  examined lying and sitting without masses, retractions, discharge or axillary adenopathy. Pelvic  Ext/BUS/vagina  generalized atrophic changes. Mild enterocele versus high cystocele noted. Cough otherwise well supported. No evidence of rectocele.  Adnexa  Without masses or tenderness    Anus and perineum  Normal   Rectovaginal  Normal sphincter tone without palpated masses or tenderness.    Assessment/Plan:  68 y.o. [id number] female for annual exam.   1. Postmenopausal/atrophic genital changes. Status post [facility] 1991 for DUB per Dr. Cherylann Banas history. Patient without significant symptoms such as hot flashes, night sweats, vaginal dryness. Not sexually active. Will continue to monitor. 2. Slight enterocele versus high cystocele. Patient's asymptomatic without bladder symptoms or pressure symptoms. Will continue to monitor. 3. Osteopenia. DEXA 08/2011 T score -1.8 FRAX 9.7%/1.2%. Repeat DEXA now. Increase calcium and vitamin D intake reviewed. 4. Mammography 07/2013. Continue with annual mammography. SBE monthly reviewed. Strong family history of breast cancer with breast cancer in her sister age 52, maternal cousin age 66, great aunt  unknown age, ovarian cancer age 58 a maternal aunt. Had discussed BRCA testing with Dr. Cherylann Banas several times but declined. I again reviewed the option for BRCA testing and the benefits as far as increased surveillance or prophylactic mastectomy/salpingo-oophorectomy. Patient agrees at this time for testing and we'll go ahead and have this drawn. 5. Pap smear 2012. No Pap smear done today. No history of abnormal Pap smears previously. Status post hysterectomy for benign indications. Age 68. Patient and I reviewed current screening guidelines and she agrees with stopped screening. 6. Colonoscopy 2010. Repeat at their recommended interval. 7. Health maintenance. No routine blood work done today as this is all done through her primary physician's office. Followup for BRCA testing results otherwise annually.   Note: This document was prepared with digital dictation and possible smart phrase technology. Any transcriptional errors that result from this process are unintentional.   [name] MD, 4:39 PM 11/17/2013

## 2013-11-17 NOTE — Patient Instructions (Signed)
Followup for bone density as scheduled. Followup in one year for annual exam 

## 2013-11-18 LAB — URINALYSIS W MICROSCOPIC + REFLEX CULTURE
BACTERIA UA: NONE SEEN
BILIRUBIN URINE: NEGATIVE
CASTS: NONE SEEN
CRYSTALS: NONE SEEN
GLUCOSE, UA: NEGATIVE mg/dL
Hgb urine dipstick: NEGATIVE
KETONES UR: NEGATIVE mg/dL
Nitrite: NEGATIVE
PH: 6.5 (ref 5.0–8.0)
Protein, ur: NEGATIVE mg/dL
SQUAMOUS EPITHELIAL / LPF: NONE SEEN
UROBILINOGEN UA: 0.2 mg/dL (ref 0.0–1.0)

## 2013-11-19 LAB — URINE CULTURE
Colony Count: NO GROWTH
Organism ID, Bacteria: NO GROWTH

## 2013-12-11 ENCOUNTER — Ambulatory Visit: Payer: Medicare Other | Admitting: Internal Medicine

## 2013-12-31 ENCOUNTER — Encounter: Payer: Self-pay | Admitting: Nurse Practitioner

## 2013-12-31 ENCOUNTER — Ambulatory Visit (INDEPENDENT_AMBULATORY_CARE_PROVIDER_SITE_OTHER): Payer: Medicare Other | Admitting: Nurse Practitioner

## 2013-12-31 VITALS — BP 104/68 | HR 73 | Temp 98.4°F | Ht 65.0 in | Wt 161.4 lb

## 2013-12-31 DIAGNOSIS — N390 Urinary tract infection, site not specified: Secondary | ICD-10-CM

## 2013-12-31 LAB — POCT URINALYSIS DIPSTICK
Bilirubin, UA: NEGATIVE
Glucose, UA: NEGATIVE
Ketones, UA: NEGATIVE
NITRITE UA: NEGATIVE
PH UA: 7.5
Protein, UA: NEGATIVE
Spec Grav, UA: <=1.005
UROBILINOGEN UA: 0.2

## 2013-12-31 MED ORDER — NITROFURANTOIN MONOHYD MACRO 100 MG PO CAPS: 100.0000 mg | ORAL_CAPSULE | Freq: Two times a day (BID) | ORAL | Status: DC

## 2013-12-31 NOTE — Progress Notes (Signed)
   Subjective:    Patient ID: Cynthia Thornton, female    DOB: Dec 28, 1945, 68 y.o.   MRN: [id number]  Urinary Tract Infection  This is a new problem. The current episode started in the past 7 days (3d). The problem occurs every urination. The problem has been gradually worsening. The quality of the pain is described as burning. The pain is moderate. There has been no fever. Associated symptoms include frequency, hesitancy and urgency. Pertinent negatives include no chills, discharge, flank pain, hematuria, nausea, sweats or vomiting. She has tried nothing for the symptoms.      Review of Systems  Constitutional: Negative for fever, chills, activity change, appetite change and fatigue.  HENT: Negative for congestion.   Cardiovascular: Negative for chest pain.  Gastrointestinal: Negative for nausea, vomiting and abdominal pain.  Genitourinary: Positive for dysuria, hesitancy, urgency and frequency. Negative for hematuria, flank pain, vaginal discharge, vaginal pain and pelvic pain.  Musculoskeletal: Negative for back pain.       Objective:   Physical Exam  Vitals reviewed. Constitutional: She is oriented to person, place, and time. She appears well-developed and well-nourished. No distress.  HENT:  Head: Normocephalic and atraumatic.  Eyes: Conjunctivae are normal. Right eye exhibits no discharge. Left eye exhibits no discharge.  Cardiovascular: Normal rate.   Abdominal: Soft. She exhibits no distension and no mass. There is no tenderness. There is no rebound and no guarding.  Neurological: She is alert and oriented to person, place, and time.  Skin: Skin is warm and dry.  Psychiatric: She has a normal mood and affect. Her behavior is normal. Thought content normal.          Assessment & Plan:  1. Infection of urinary tract UA dip- pos leuks & blood - nitrofurantoin, macrocrystal-monohydrate, (MACROBID) 100 MG capsule; Take 1 capsule (100 mg total) by mouth 2 (two) times daily.   Dispense: 10 capsule; Refill: 0 - Urine culture-pending

## 2013-12-31 NOTE — Patient Instructions (Signed)
Start antibiotic. Continue to Sip hydrating fluids (water, juice, colorless soda, decaff tea) every hour to flush kidneys. Let us know if symptoms do not improve or you feel worse.  Urinary Tract Infection Urinary tract infections (UTIs) can develop anywhere along your urinary tract. Your urinary tract is your body's drainage system for removing wastes and extra water. Your urinary tract includes two kidneys, two ureters, a bladder, and a urethra. Your kidneys are a pair of bean-shaped organs. Each kidney is about the size of your fist. They are located below your ribs, one on each side of your spine. CAUSES Infections are caused by microbes, which are microscopic organisms, including fungi, viruses, and bacteria. These organisms are so small that they can only be seen through a microscope. Bacteria are the microbes that most commonly cause UTIs. SYMPTOMS  Symptoms of UTIs may vary by age and gender of the patient and by the location of the infection. Symptoms in young women typically include a frequent and intense urge to urinate and a painful, burning feeling in the bladder or urethra during urination. Older women and men are more likely to be tired, shaky, and weak and have muscle aches and abdominal pain. A fever may mean the infection is in your kidneys. Other symptoms of a kidney infection include pain in your back or sides below the ribs, nausea, and vomiting. DIAGNOSIS To diagnose a UTI, your caregiver will ask you about your symptoms. Your caregiver also will ask to provide a urine sample. The urine sample will be tested for bacteria and Huckins blood cells. Bea[place]ac blood cells are made by your body to help fight infection. TREATMENT  Typically, UTIs can be treated with medication. Because most UTIs are caused by a bacterial infection, they usually can be treated with the use of antibiotics. The choice of antibiotic and length of treatment depend on your symptoms and the type of bacteria causing your  infection. HOME CARE INSTRUCTIONS  If you were prescribed antibiotics, take them exactly as your caregiver instructs you. Finish the medication even if you feel better after you have only taken some of the medication.  Drink enough water and fluids to keep your urine clear or pale yellow.  Avoid caffeine, tea, and carbonated beverages. They tend to irritate your bladder.  Empty your bladder often. Avoid holding urine for long periods of time.  Empty your bladder before and after sexual intercourse.  After a bowel movement, women should cleanse from front to back. Use each tissue only once. SEEK MEDICAL CARE IF:   You have back pain.  You develop a fever.  Your symptoms do not begin to resolve within 3 days. SEEK IMMEDIATE MEDICAL CARE IF:   You have severe back pain or lower abdominal pain.  You develop chills.  You have nausea or vomiting.  You have continued burning or discomfort with urination. MAKE SURE YOU:   Understand these instructions.  Will watch your condition.  Will get help right away if you are not doing well or get worse. Document Released: 07/26/2005 Document Revised: 04/16/2012 Document Reviewed: 11/24/2011 [facility] Patient Information 2014 [place].

## 2013-12-31 NOTE — Progress Notes (Signed)
Pre visit review using our clinic review tool, if applicable. No additional management support is needed unless otherwise documented below in the visit note. 

## 2014-01-03 LAB — URINE CULTURE

## 2014-01-05 ENCOUNTER — Other Ambulatory Visit: Payer: Self-pay | Admitting: Nurse Practitioner

## 2014-01-05 DIAGNOSIS — N390 Urinary tract infection, site not specified: Secondary | ICD-10-CM

## 2014-01-05 MED ORDER — CIPROFLOXACIN HCL 250 MG PO TABS: 250.0000 mg | ORAL_TABLET | Freq: Two times a day (BID) | ORAL | Status: DC

## 2014-01-05 NOTE — Progress Notes (Signed)
Urine C&S; e.coli. Not sensitive to nitro. Will change to cipro.

## 2014-01-05 NOTE — Progress Notes (Signed)
Pt.notified

## 2014-01-08 ENCOUNTER — Other Ambulatory Visit: Payer: Self-pay | Admitting: Nurse Practitioner

## 2014-01-08 ENCOUNTER — Telehealth: Payer: Self-pay | Admitting: Internal Medicine

## 2014-01-08 DIAGNOSIS — N390 Urinary tract infection, site not specified: Secondary | ICD-10-CM

## 2014-01-08 MED ORDER — CIPROFLOXACIN HCL 500 MG PO TABS: 500.0000 mg | ORAL_TABLET | Freq: Two times a day (BID) | ORAL | Status: DC

## 2014-01-08 NOTE — Telephone Encounter (Signed)
Please advise 

## 2014-01-08 NOTE — Telephone Encounter (Signed)
Patient stated that she had already finished the [employer] on Sunday before we called her on Monday. Patient then took the cipro and completed that this morning. Patient stated that she felt worse today then before.

## 2014-01-08 NOTE — Progress Notes (Signed)
Patient notified

## 2014-01-08 NOTE — Progress Notes (Signed)
Pt called said tool last cipro this am, dysuria has worsened today. Will increase cipro to pyelo dose: 500 mg bid X 5d.

## 2014-01-08 NOTE — Telephone Encounter (Signed)
Patient called and stated that she is still in pain from visit 12/31/2013 for a UTI. Please advise. Patient would like for [place] to call her after 4:00pm this afternoon is possible.

## 2014-02-12 ENCOUNTER — Ambulatory Visit (INDEPENDENT_AMBULATORY_CARE_PROVIDER_SITE_OTHER): Payer: Medicare Other

## 2014-02-12 DIAGNOSIS — M949 Disorder of cartilage, unspecified: Secondary | ICD-10-CM

## 2014-02-12 DIAGNOSIS — M899 Disorder of bone, unspecified: Secondary | ICD-10-CM

## 2014-02-12 DIAGNOSIS — M8580 Other specified disorders of bone density and structure, unspecified site: Secondary | ICD-10-CM

## 2014-02-13 ENCOUNTER — Encounter: Payer: Self-pay | Admitting: Gynecology

## 2014-02-26 ENCOUNTER — Emergency Department (HOSPITAL_BASED_OUTPATIENT_CLINIC_OR_DEPARTMENT_OTHER)
Admission: EM | Admit: 2014-02-26 | Discharge: 2014-02-26 | Disposition: A | Discharge status: Home / Self Care | Payer: Medicare Other | Attending: Emergency Medicine | Admitting: Emergency Medicine

## 2014-02-26 ENCOUNTER — Emergency Department (HOSPITAL_BASED_OUTPATIENT_CLINIC_OR_DEPARTMENT_OTHER): Payer: Medicare Other

## 2014-02-26 ENCOUNTER — Encounter (HOSPITAL_BASED_OUTPATIENT_CLINIC_OR_DEPARTMENT_OTHER): Payer: Self-pay | Admitting: Emergency Medicine

## 2014-02-26 DIAGNOSIS — R042 Hemoptysis: Secondary | ICD-10-CM

## 2014-02-26 DIAGNOSIS — Z79899 Other long term (current) drug therapy: Secondary | ICD-10-CM | POA: Insufficient documentation

## 2014-02-26 DIAGNOSIS — Z8639 Personal history of other endocrine, nutritional and metabolic disease: Secondary | ICD-10-CM | POA: Insufficient documentation

## 2014-02-26 DIAGNOSIS — IMO0002 Reserved for concepts with insufficient information to code with codable children: Secondary | ICD-10-CM | POA: Insufficient documentation

## 2014-02-26 DIAGNOSIS — Z8739 Personal history of other diseases of the musculoskeletal system and connective tissue: Secondary | ICD-10-CM | POA: Insufficient documentation

## 2014-02-26 DIAGNOSIS — Z860100 Personal history of colon polyps, unspecified: Secondary | ICD-10-CM | POA: Insufficient documentation

## 2014-02-26 DIAGNOSIS — Z872 Personal history of diseases of the skin and subcutaneous tissue: Secondary | ICD-10-CM | POA: Insufficient documentation

## 2014-02-26 DIAGNOSIS — Z792 Long term (current) use of antibiotics: Secondary | ICD-10-CM | POA: Insufficient documentation

## 2014-02-26 DIAGNOSIS — Z862 Personal history of diseases of the blood and blood-forming organs and certain disorders involving the immune mechanism: Secondary | ICD-10-CM | POA: Insufficient documentation

## 2014-02-26 DIAGNOSIS — Z87891 Personal history of nicotine dependence: Secondary | ICD-10-CM | POA: Insufficient documentation

## 2014-02-26 DIAGNOSIS — K219 Gastro-esophageal reflux disease without esophagitis: Secondary | ICD-10-CM | POA: Insufficient documentation

## 2014-02-26 DIAGNOSIS — Z8742 Personal history of other diseases of the female genital tract: Secondary | ICD-10-CM | POA: Insufficient documentation

## 2014-02-26 MED ORDER — AZITHROMYCIN 250 MG PO TABS: ORAL_TABLET | ORAL | Status: DC

## 2014-02-26 NOTE — ED Notes (Signed)
Coughing up blood since 8:30pm.

## 2014-02-26 NOTE — ED Notes (Signed)
Prescript[ption called in to [facility] [address]

## 2014-02-26 NOTE — Discharge Instructions (Signed)
Zithromax as prescribed.  Followup with your pulmonologist. Call in the morning to arrange an appointment and return to the ER if you develop difficulty breathing, severe chest pain, or other new and bothersome symptoms.   Hemoptysis Hemoptysis, which means coughing up blood, can be a sign of a minor problem or a serious medical condition. The blood that is coughed up may come from the lungs and airways. Coughed-up blood can also come from bleeding that occurs outside the lungs and airways. Blood can drain into the windpipe during a severe nosebleed or when blood is vomited from the stomach. Because hemoptysis can be a sign of something serious, a medical evaluation is required. For some people with hemoptysis, no definite cause is ever identified. CAUSES  The most common cause of hemoptysis is bronchitis. Some other common causes include:   A ruptured blood vessel caused by coughing or an infection.   A medical condition that causes damage to the large air passageways (bronchiectasis).   A blood clot in the lungs (pulmonary embolism).   Pneumonia.   Tuberculosis.   Breathing in a small foreign object.   Cancer. For some people with hemoptysis, no definite cause is ever identified.  HOME CARE INSTRUCTIONS  Only take over-the-counter or prescription medicines as directed by your caregiver. Do not use cough suppressants unless your caregiver approves.  If your caregiver prescribes antibiotic medicines, take them as directed. Finish them even if you start to feel better.  Do not smoke. Also avoid secondhand smoke.  Follow up with your caregiver as directed. SEEK IMMEDIATE MEDICAL CARE IF:   You cough up bloody mucus for longer than a week.  You have a blood-producing cough that is severe or getting worse.  You have a blood-producing cough thatcomes and goes over time.  You develop problems with your breathing.   You vomit blood.  You develop bloody or black-colored  stools.  You have chest pain.   You develop night sweats.  You feel faint or pass out.   You have a fever or persistent symptoms for more than 2 3 days.  You have a fever and your symptoms suddenly get worse. MAKE SURE YOU:  Understand these instructions.  Will watch your condition.  Will get help right away if you are not doing well or get worse. Document Released: 12/25/2001 Document Revised: 10/02/2012 Document Reviewed: 08/02/2012 [facility] Patient Information 2014 [place], [place].

## 2014-02-26 NOTE — ED Provider Notes (Signed)
CSN: [id number]     Arrival date & time 02/26/14  2145 History  This chart was scribed for Veryl Speak, MD by Eston Mould, ED Scribe. This patient was seen in room MH07/MH07 and the patient's care was started at 10:24 PM.   Chief Complaint  Patient presents with  . Hemoptysis   Patient is a 68 y.o. female presenting with cough. The history is provided by the patient. No language interpreter was used.  Cough Cough characteristics:  Productive Sputum characteristics:  Pink (smear of blood) Severity:  Mild Onset quality:  Sudden Duration:  1 hour Timing:  Sporadic Progression:  Unchanged Chronicity:  New Smoker: no (15 years ago)   Context: weather changes   Context: not sick contacts, not smoke exposure and not with activity   Relieved by:  Nothing Worsened by:  Nothing tried Ineffective treatments:  None tried Associated symptoms: no chest pain, no chills, no fever, no headaches, no rhinorrhea, no shortness of breath, no sinus congestion, no sore throat and no wheezing   Risk factors: no recent travel    HPI Comments: Cynthia Thornton is a 68 y.o. female who presents to the Emergency Department complaining of productive cough with a smear of blood in sputum that occurred 1 hour ago. She states one day in November 2014, was sent to the ED for a CXR and F/U with Pulmonary provider the following day. Was told she had scare tissue around her lungs from pneumonia. She states she was placed on Azithromycin and Mucinex. Reports having 2 F/U apts afterwards. Pt was informed if the episode occurs again, she will have to be seen at [facility] for tx. She states today, while driving home from work, she coughed and reports having blood in her sputum. She is unsure if she has seasonal allergies but states she has noticed congestion and sneezing during Fall/Spring season. Pt states she began coughing 3 days ago. Reports hx of smoking 15 years ago denies smoking at this time. States mother  died of lung cancer. She denies fever, SOB, trouble breathing, sinus problems, and sore throat.  Past Medical History  Diagnosis Date  . Osteopenia 01/2014    T score -1.5 FRAX 8.9%/1.0%  . DUB (dysfunctional uterine bleeding)   . Arthritis   . Hiatal hernia 08/15/2006    EGD  . Esophageal reflux 08/15/2006    EGD(Chronic)  . Hyperthyroidism   . Esophageal stricture 08/15/2006    EGD  . Personal history of colonic polyps 09/06/2001    hyperplastic  . Pityriasis rosea    Past Surgical History  Procedure Laterality Date  . Tonsillectomy    . Breast surgery  2000    Breast cyst removed, left  . Vaginal hysterectomy  1991    Right ovarian cystectomy   Family History  Problem Relation Age of Onset  . Osteoporosis Mother   . Lung cancer Mother   . Stroke Mother   . Diabetes Father   . Hypertension Father   . Osteoporosis Father   . Colon cancer Father 50  . Breast cancer Sister     Age 73  . Ovarian cancer Maternal Aunt 60  . Breast cancer Cousin     maternal-Age 39  . Breast cancer Maternal Aunt     great aunt- Age unknown  . Colon cancer Paternal Uncle 84  . Colon cancer Cousin     maternal  . Crohn's disease Son    History  Substance Use Topics  . Smoking  status: Former Smoker -- 0.20 packs/day for 10 years    Types: Cigarettes    Quit date: 10/31/1995  . Smokeless tobacco: Never Used  . Alcohol Use: 0.0 oz/week     Comment: occ   OB History   Grav Para Term Preterm Abortions TAB SAB Ect Mult Living   3 2 2  1     2      Review of Systems  Constitutional: Negative for fever and chills.  HENT: Negative for rhinorrhea, sinus pressure and sore throat.   Respiratory: Positive for cough. Negative for shortness of breath and wheezing.   Cardiovascular: Negative for chest pain.  Neurological: Negative for headaches.  All other systems reviewed and are negative.   Allergies  Codeine and Propranolol hcl  Home Medications   Prior to Admission medications    Medication Sig Start Date End Date Taking? Authorizing Provider  cetirizine (ZYRTEC) 10 MG tablet Take 10 mg by mouth daily.    Historical Provider, MD  ciprofloxacin (CIPRO) 500 MG tablet Take 1 tablet (500 mg total) by mouth 2 (two) times daily. 01/08/14   Irene Pap, NP  esomeprazole (NEXIUM) 40 MG capsule Take 30-60 min before first meal of the day 09/24/13   Tanda Rockers, MD  famotidine (PEPCID) 20 MG tablet One at bedtime 09/24/13   Tanda Rockers, MD  fluticasone [facility]) 50 MCG/ACT nasal spray Place 2 sprays into both nostrils daily. 10/10/13   Brand Males, MD  nitrofurantoin, macrocrystal-monohydrate, (MACROBID) 100 MG capsule Take 1 capsule (100 mg total) by mouth 2 (two) times daily. 12/31/13   Irene Pap, NP   BP 128/92  Pulse 88  Temp(Src) 98.2 F (36.8 C) (Oral)  Resp 20  Ht 5\' 5"  (1.651 m)  Wt 155 lb (70.308 kg)  BMI 25.79 kg/m2  SpO2 100%  Physical Exam  Nursing note and vitals reviewed. Constitutional: She is oriented to person, place, and time. She appears well-developed and well-nourished. No distress.  HENT:  Head: Normocephalic and atraumatic.  Right Ear: External ear normal.  Left Ear: External ear normal.  Nose: Nose normal.  Mouth/Throat: Oropharynx is clear and moist.  Eyes: EOM are normal. Pupils are equal, round, and reactive to light.  Neck: Neck supple. No tracheal deviation present.  Cardiovascular: Normal rate, regular rhythm and normal heart sounds.  Exam reveals no gallop and no friction rub.   No murmur heard. Pulmonary/Chest: Effort normal and breath sounds normal. No respiratory distress. She has no wheezes. She has no rales. She exhibits no tenderness.  Abdominal: Soft.  Musculoskeletal: Normal range of motion.  Neurological: She is alert and oriented to person, place, and time.  Skin: Skin is warm and dry.  Psychiatric: She has a normal mood and affect. Her behavior is normal.    ED Course  Procedures  DIAGNOSTIC  STUDIES: Oxygen Saturation is 100% on RA, normal by my interpretation.    COORDINATION OF CARE: 10:33 PM-Discussed treatment plan which includes discussed CXR and will discharge pt with antibiotics. Advised pt to F/U with Pulmonologist tomorrow and informed pt to encourage a Bronchoscopy. Informed pt to return to the ED if sx worsen in the following 24-48 hours. Pt agreed to plan.   Labs Review Labs Reviewed - No data to display  Imaging Review Dg Chest 2 View  02/26/2014   CLINICAL DATA:  Hemoptysis.  EXAM: CHEST  2 VIEW  COMPARISON:  Chest radiograph from 09/23/2013, and CT of the chest performed 08/02/2012  FINDINGS:  The lungs are well-aerated and clear. There is no evidence of focal opacification, pleural effusion or pneumothorax. Bibasilar densities likely reflect overlying soft tissues, though superimposed lingular bronchiectasis is likely still present.  The heart is normal in size; the mediastinal contour is within normal limits. No acute osseous abnormalities are seen.  IMPRESSION: Lingular bronchiectasis noted on prior CT; this is relatively similar in appearance to prior studies. Lungs otherwise grossly clear.   Electronically Signed   By: Garald Balding M.D.   On: 02/26/2014 22:32     EKG Interpretation None      MDM   Final diagnoses:  None    Patient presents here with complaints of hemoptysis. She is driving home from work and began coughing, then noticed this. She had similar episode in September of last year for which she was seen by pulmonology. His CT scan performed which revealed scarring from what appeared to be an atypical infection. She was treated with antibiotics and improved. She was told if this recurred she should have a chest x-ray performed and followup with her pulmonologist. She is not hypoxic and is in no acute distress. She will be treated with Zithromax and advised to call the pulmonologist in the morning.  I personally performed the services described in  this documentation, which was scribed in my presence. The recorded information has been reviewed and is accurate.      Veryl Speak, MD 02/26/14 [phone]

## 2014-02-27 ENCOUNTER — Ambulatory Visit (INDEPENDENT_AMBULATORY_CARE_PROVIDER_SITE_OTHER): Payer: Medicare Other | Admitting: Internal Medicine

## 2014-02-27 ENCOUNTER — Encounter: Payer: Self-pay | Admitting: Internal Medicine

## 2014-02-27 VITALS — BP 112/64 | HR 71 | Temp 98.0°F | Ht 65.0 in | Wt 155.5 lb

## 2014-02-27 DIAGNOSIS — J479 Bronchiectasis, uncomplicated: Secondary | ICD-10-CM

## 2014-02-27 DIAGNOSIS — K219 Gastro-esophageal reflux disease without esophagitis: Secondary | ICD-10-CM

## 2014-02-27 NOTE — Progress Notes (Signed)
Subjective:    Patient ID: Cynthia Thornton, female    DOB: 1946/05/25, 68 y.o.   MRN: [id number]  HPI  PCP is Kathlene November, MD    reports that she quit [phone] - Her smoking use included Cigarettes. She has a 2 pack-year smoking history. She has never used smokeless tobacco.   IOV 07/26/2012   68 year old female. 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  Reports chronic cough x 7 months ([facility] 2013). Insidious onset. Does not remember preceding URI prior to onset of cough.   There is always associated hoarseness of voice,  clearing of throat, mucus in throat that is small in amount and she has difficulty getting it out (is creamy in color) which is all progressive.  No associated tickle in throat or sinus drainage. Talking makes symptoms worse. Says giving hx to me in office makes her voice hoarse. End of day makes her worse due to talking. SHe is frustrated that because talking is her job and talking make symptoms worse and she is unable to execute her job.   GERD hx  Has had GERD since 2003 and on nexium since. In July 2013 took medical help for cough and referred to  ENT eval: per her hx they told her it was GERD related cough but shey says that GERD symptoms are different from current cough. In terms of diet: drink tea, drinks diet pepsi 2-4 cans per day, but avoids pizza, fried food. Will have cheese on salad. Has only occasional chocolate. Eats bran cereal a day. Lunch is fruit and soup.   SINUS hx Denies post nasal sinus drainage. Denies tickle down throat ENT eval per hx in July 2013: normal sinuses. Trial of flonase July-August 2013: tried just one squirt and she did not like the sensation. So never used it.   ACE INHIBITOR Denies associated ACE inhibitor intake   SMOKING  Hx  1-3 cigs per day. Quit 16 years ago at age 30. Started smoking age 89.   FAm Hx  - mom died of lung cancer and had cough. Patient very concerned that cough is due to lung  cancer. I believe she feels guilty she might have missed her mom's cancer when her mom complained of chronic cough. She prefers upfront CT chest   CXR 05/30/12 1. No acute cardiopulmonary disease.  2. Hyperexpanded lungs with minimal linear heterogeneous opacities  within the right middle lobe and lingula, nonspecific possibly  atelectasis or scar, though chronic atypical infection (MAI) may  have a similar appearance.  Original Report Authenticated By: Rachel Moulds, M.D.  REC Please have CT chest without contrast  Please have breathing test called PFT  Return to see me after above     OV 10/11/2012 Fu cough  -Since last visit, without interventions , cough has improved 40%. She says drier weather was the reason for improvement. Says, more than cough, clearing of throat is the problem. REsults below of CT and PFT reviewed with patient. AT this stage, she feels she wants expectant Rx only. Not interested in further testing or intervention   PFT - normal   IMPRESSION:  CT chest 08/02/12 1. No acute cardiopulmonary abnormalities.  2. Lingular bronchiectasis  3. There are two clustered areas of nodularity within the  periphery of both lungs. These do not have worrisome features and  likely the sequela of atypical infection.  4. Liver and splenic hypodensities were present on the exam  from  10/07/2004 and likely reflect a combination of benign liver cyst  and hemangiomas.  Original Report Authenticated By: Angelita Ingles, M.D.  Glad you are much better Continue to control acid reflux You can even try voice rest for few days if cough returns As discussed and agreed, we are holding off further testing or treatment interventions Call [phone] and return if cough becomes a problem   09/24/2013 f/u ov/Cynthia Thornton re:  Chief Complaint  Patient presents with  . Acute Visit    Pt c/o hemoptysis x 2 days- She states that this is the first time she has had this problem. No change in her  breathing or other co's.    states she was not aware of dx of bronchiectasis or implications re risk of hemoptysis. Sputum did not really turn purulent before blood but the total amt is < tsp, no assoc epistaxis or sinus complaints     CTG sinus 09/30/13:  Mild mucosal thickening base of left maxillary sinus. No air-fluid  level.  REC Nexium + pepcid    OV 10/10/2013 ov/ [name]   Chief Complaint  Patient presents with  . Cough    follow-up.  Pt states she has not coughed up any more blood. She states cough is improved, but she does c/o having a "stuffy nose" all day.   Followup chronic cough; multifactorial in the setting of lingular bronchiectasis  Cough never really went away. I am seeing her after a year. A month ago she did see Dr. Legrand Como Samay Delcarlo having acute onset of hemoptysis one time. Chest x-ray was unremarkable and CT scan of the sinus showed mild sinus thickening but otherwise unremarkable. It was felt that the hemoptysis was from her lingular bronchiectasis. However, she did not have any infection at that time. Currently cough persist. She points to her throat. She clears her throat all the time. Cough is laryngeal and quality. Mostly dry but occasionally she does bring mucus. She is compliant with her antihistamine and Nexium and Pepcid. RSI cough score is 10  This  Time she is open to Rx and getting rid of cough.   rec Cough is from possible sinus drainage/allergies, possible acid reflux, possible  asthma,  All of this is working together to cause cyclical cough/LPR cough  #Sinus drainage  - start  Hypertonic saline nasal spray -  take generic fluticasone inhaler 2 squirts each nostril daily - continue anti histamime  # Acid Reflux  -continue nexium and pepcid - avoid colas, spices, cheeses, spirits, red meats, beer, chocolates, fried foods etc.,   - sleep with head end of bed elevated  - eat small frequent meals  - do not go to bed for 3 hours after last  meal  # Poissible Asthma   - do methacholine challenge test  #Vocal Cord Dysfunction or Irritable Larynx  - will refer you to speech therapy with Mr Garald Balding -  please choose 3 days and observe complete voice rest - no talking or whispering  - at all times there  there is urge to cough, drink water or swallow or sip on throat lozenge  - start neurontin 300mg  po daily  X 3 days, then 300mg  po twice daily x 3 days, then 300mg  po three times daily to continue. If this makes you sleepy, cut down on dose but you should continue neurontin at all times  #coughing up blood  - keep any eye on this, if recus wil  Need bronchoscopy  #  Health maintenance  - flu shot and prevnar    02/27/2014 f/u ov/Cynthia Thornton re: bronchiectasis/ chronic cough  Cynthia Thornton took Transport planner Complaint  Patient presents with  . Acute Visit    Pt states had hemoptysis yesterday while on her way home from work. She went to ED and had cxr and was started on zithromax. She c/o chest tightness that she relates to anxiety since this did not start until after hemoptysis.   not a tablespoon total of blood and no antecedent purulent sputum or epistxis/cp or sob zpak started one day  prior to OV     No obvious day to day or daytime variabilty or assoc   subjective wheeze overt sinus or hb symptoms. No unusual exp hx or h/o childhood pna/ asthma or knowledge of premature birth.  Sleeping ok without nocturnal  or early am exacerbation  of respiratory  c/o's or need for noct saba. Also denies any obvious fluctuation of symptoms with weather or environmental changes or other aggravating or alleviating factors except as outlined above   Current Medications, Allergies, Complete Past Medical History, Past Surgical History, Family History, and Social History were reviewed in [employer] record.  ROS  The following are not active complaints unless bolded sore throat, dysphagia, dental problems, itching, sneezing,   nasal congestion or excess/ purulent secretions, ear ache,   fever, chills, sweats, unintended wt loss, pleuritic or exertional cp, hemoptysis,  orthopnea pnd or leg swelling, presyncope, palpitations, heartburn, abdominal pain, anorexia, nausea, vomiting, diarrhea  or change in bowel or urinary habits, change in stools or urine, dysuria,hematuria,  rash, arthralgias, visual complaints, headache, numbness weakness or ataxia or problems with walking or coordination,  change in mood/affect or memory.               Objective:   Physical Exam   amb wf nad  , Wt Readings from Last 3 Encounters:  02/27/14 155 lb 8 oz (70.534 kg)  02/26/14 155 lb (70.308 kg)  12/31/13 161 lb 6.4 oz (73.211 kg)      HEENT: nl dentition, turbinates, and orophanx. Nl external ear canals without cough reflex   NECK :  without JVD/Nodes/TM/ nl carotid upstrokes bilaterally   LUNGS: no acc muscle use, clear to A and P bilaterally without cough on insp or exp maneuvers   CV:  RRR  no s3 or murmur or increase in P2, no edema   ABD:  soft and nontender with nl excursion in the supine position. No bruits or organomegaly, bowel sounds nl  MS:  warm without deformities, calf tenderness, cyanosis or clubbing  SKIN: warm and dry without lesions    NEURO:  alert, approp, no deficits     cxr 02/26/14  Lingular bronchiectasis noted on prior CT; this is relatively  similar in appearance to prior studies. Lungs otherwise grossly  clear.       Assessment & Plan:

## 2014-02-27 NOTE — Patient Instructions (Signed)
Please schedule a follow up visit in 3 months but call sooner if needed with DR Chase Caller to discuss longterm management of your problem

## 2014-02-28 NOTE — Assessment & Plan Note (Addendum)
See CT chest  08/02/13 :  Lingular bronchiectasis - Sinus CT 09/30/13 > Mild mucosal thickening base of left maxillary sinus. No air-fluid level. - Prevnar rx 09/2013   I had an extended discussion with the patient today lasting 15 to 20 minutes of a 25 minute visit on the following issues: Hemoptysis assoc with bronchiectasis flare  = this time appears to be  a relatively mild flare but likely will become a more frequent occurrence and ultimately, if the process remains localized, might be a good candidate for lingulectomy consideration at some point  For now rx is conservative with short course of abx first starting zmax and reserving  FQ's if needed for more severe or refractory symptos and f/u per Dr Chase Caller  See instructions for specific recommendations which were reviewed directly with the patient who was given a copy with highlighter outlining the key components.

## 2014-02-28 NOTE — Assessment & Plan Note (Signed)
Explained natural history of uri and why it's necessary in patients at risk to treat GERD aggressively  at least  short term   to reduce risk of evolving cyclical cough initially  triggered by epithelial injury and a heightened sensitivty to the effects of any upper airway irritants,  most importantly acid - related.  That is, the more sensitive the epithelium damaged for virus, the more the cough, the more the secondary reflux (especially in those prone to reflux) the more the irritation of the sensitive mucosa and so on in a cyclical pattern.  

## 2014-04-10 ENCOUNTER — Telehealth: Payer: Self-pay | Admitting: Internal Medicine

## 2014-04-10 ENCOUNTER — Ambulatory Visit: Payer: Medicare Other | Admitting: Adult Health

## 2014-04-10 NOTE — Telephone Encounter (Signed)
Called spoke with pt. appt scheduled to see TP this afternoon at 2:45. Nothing further needed

## 2014-06-22 ENCOUNTER — Encounter: Payer: Self-pay | Admitting: Internal Medicine

## 2014-06-22 ENCOUNTER — Ambulatory Visit (INDEPENDENT_AMBULATORY_CARE_PROVIDER_SITE_OTHER): Payer: Medicare Other | Admitting: Internal Medicine

## 2014-06-22 VITALS — BP 112/76 | HR 84 | Ht 65.0 in | Wt 158.0 lb

## 2014-06-22 DIAGNOSIS — J479 Bronchiectasis, uncomplicated: Secondary | ICD-10-CM

## 2014-06-22 DIAGNOSIS — R042 Hemoptysis: Secondary | ICD-10-CM

## 2014-06-22 NOTE — Progress Notes (Signed)
Subjective:    Patient ID: Cynthia Thornton, female    DOB: May 13, 1946, 68 y.o.   MRN: [id number]  HPI   se Larose Kells, MD    reports that she quit [phone] - Her smoking use included Cigarettes. She has a 2 pack-year smoking history. She has never used smokeless tobacco.   IOV 07/26/2012   68 year old female. 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  Reports chronic cough x 7 months ([facility] 2013). Insidious onset. Does not remember preceding URI prior to onset of cough.   There is always associated hoarseness of voice,  clearing of throat, mucus in throat that is small in amount and she has difficulty getting it out (is creamy in color) which is all progressive.  No associated tickle in throat or sinus drainage. Talking makes symptoms worse. Says giving hx to me in office makes her voice hoarse. End of day makes her worse due to talking. SHe is frustrated that because talking is her job and talking make symptoms worse and she is unable to execute her job.   GERD hx  Has had GERD since 2003 and on nexium since. In July 2013 took medical help for cough and referred to  ENT eval: per her hx they told her it was GERD related cough but shey says that GERD symptoms are different from current cough. In terms of diet: drink tea, drinks diet pepsi 2-4 cans per day, but avoids pizza, fried food. Will have cheese on salad. Has only occasional chocolate. Eats bran cereal a day. Lunch is fruit and soup.   SINUS hx Denies post nasal sinus drainage. Denies tickle down throat ENT eval per hx in July 2013: normal sinuses. Trial of flonase July-August 2013: tried just one squirt and she did not like the sensation. So never used it.   ACE INHIBITOR Denies associated ACE inhibitor intake   SMOKING  Hx  1-3 cigs per day. Quit 16 years ago at age 36. Started smoking age 89.   FAm Hx  - mom died of lung cancer and had cough. Patient very concerned that cough is due to lung  cancer. I believe she feels guilty she might have missed her mom's cancer when her mom complained of chronic cough. She prefers upfront CT chest   CXR 05/30/12 1. No acute cardiopulmonary disease.  2. Hyperexpanded lungs with minimal linear heterogeneous opacities  within the right middle lobe and lingula, nonspecific possibly  atelectasis or scar, though chronic atypical infection (MAI) may  have a similar appearance.  Original Report Authenticated By: Rachel Moulds, M.D.  REC Please have CT chest without contrast  Please have breathing test called PFT  Return to see me after above     OV 10/11/2012 Fu cough  -Since last visit, without interventions , cough has improved 40%. She says drier weather was the reason for improvement. Says, more than cough, clearing of throat is the problem. REsults below of CT and PFT reviewed with patient. AT this stage, she feels she wants expectant Rx only. Not interested in further testing or intervention   PFT - normal   IMPRESSION:  CT chest 08/02/12 1. No acute cardiopulmonary abnormalities.  2. Lingular bronchiectasis  3. There are two clustered areas of nodularity within the  periphery of both lungs. These do not have worrisome features and  likely the sequela of atypical infection.  4. Liver and splenic hypodensities were present on the exam from  10/07/2004 and likely reflect a combination of benign liver cyst  and hemangiomas.  Original Report Authenticated By: Angelita Ingles, M.D.  Glad you are much better Continue to control acid reflux You can even try voice rest for few days if cough returns As discussed and agreed, we are holding off further testing or treatment interventions Call [phone] and return if cough becomes a problem   09/24/2013 f/u ov/Wert re:  Chief Complaint  Patient presents with  . Acute Visit    Pt c/o hemoptysis x 2 days- She states that this is the first time she has had this problem. No change in her  breathing or other co's.    states she was not aware of dx of bronchiectasis or implications re risk of hemoptysis. Sputum did not really turn purulent before blood but the total amt is < tsp, no assoc epistaxis or sinus complaints     CTG sinus 09/30/13:  Mild mucosal thickening base of left maxillary sinus. No air-fluid  level.  REC Nexium + pepcid    OV 10/10/2013 ov/ Mehar Kirkwood   Chief Complaint  Patient presents with  . Cough    follow-up.  Pt states she has not coughed up any more blood. She states cough is improved, but she does c/o having a "stuffy nose" all day.   Followup chronic cough; multifactorial in the setting of lingular bronchiectasis  Cough never really went away. I am seeing her after a year. A month ago she did see Dr. Legrand Como wert having acute onset of hemoptysis one time. Chest x-ray was unremarkable and CT scan of the sinus showed mild sinus thickening but otherwise unremarkable. It was felt that the hemoptysis was from her lingular bronchiectasis. However, she did not have any infection at that time. Currently cough persist. She points to her throat. She clears her throat all the time. Cough is laryngeal and quality. Mostly dry but occasionally she does bring mucus. She is compliant with her antihistamine and Nexium and Pepcid. RSI cough score is 10  This  Time she is open to Rx and getting rid of cough.   rec Cough is from possible sinus drainage/allergies, possible acid reflux, possible  asthma,  All of this is working together to cause cyclical cough/LPR cough  #Sinus drainage  - start  Hypertonic saline nasal spray -  take generic fluticasone inhaler 2 squirts each nostril daily - continue anti histamime  # Acid Reflux  -continue nexium and pepcid - avoid colas, spices, cheeses, spirits, red meats, beer, chocolates, fried foods etc.,   - sleep with head end of bed elevated  - eat small frequent meals  - do not go to bed for 3 hours after last  meal  # Poissible Asthma   - do methacholine challenge test  #Vocal Cord Dysfunction or Irritable Larynx  - will refer you to speech therapy with Mr Garald Balding -  please choose 3 days and observe complete voice rest - no talking or whispering  - at all times there  there is urge to cough, drink water or swallow or sip on throat lozenge  - start neurontin 300mg  po daily  X 3 days, then 300mg  po twice daily x 3 days, then 300mg  po three times daily to continue. If this makes you sleepy, cut down on dose but you should continue neurontin at all times  #coughing up blood  - keep any eye on this, if recus wil  Need bronchoscopy  #Health maintenance  -  flu shot and prevnar    02/27/2014 f/u ov/Wert re: bronchiectasis/ chronic cough  Janice Coffin took Transport planner Complaint  Patient presents with  . Acute Visit    Pt states had hemoptysis yesterday while on her way home from work. She went to ED and had cxr and was started on zithromax. She c/o chest tightness that she relates to anxiety since this did not start until after hemoptysis.   not a tablespoon total of blood and no antecedent purulent sputum or epistxis/cp or sob zpak started one day  prior to OV     No obvious day to day or daytime variabilty or assoc   subjective wheeze overt sinus or hb symptoms. No unusual exp hx or h/o childhood pna/ asthma or knowledge of premature birth.  Sleeping ok without nocturnal  or early am exacerbation  of respiratory  c/o's or need for noct saba. Also denies any obvious fluctuation of symptoms with weather or environmental changes or other aggravating or alleviating factors except as outlined above  Cough  REC Please schedule a follow up visit in 3 months but call sooner if needed with DR Chase Caller to discuss longterm management of your problem  OV 06/22/2014  Chief Complaint  Patient presents with  . Follow-up    Pt saw MW for an acute visit on 02/2014. Pt c/o cough with intermittent  hemoptysis, last time pt had hemoptysis was in 02/2014. Pt denies SOB and CP/tightness.         Followup chronic cough. Ms. Pahola Dimmitt tells me that she no longer has chronic cough. However, in November 2014 she had a new onset of hemoptysis out of the blue. She showed me pictures it looks like she had flecks of hemoptysis. There is no associated infectious symptoms at that time. Episodes that'll spontaneously. Then again the same thing happened in May 2015. There was no associated fever, chest pain, syncope, wheezing, cough, yellow sputum. In between these episodes she has remained largely free of chronic cough except for some occasional mucus every few weeks./ D/w radiologist Dr Weber Cooks : based on prior imaging odds of pulmonary AVM low and hemoptuysis likely from bronchiectasis.  - A CT scan of the chest was in October 2013 that showed mild amount of lingular bronchiectasis   smoking:  reports that she quit smoking about 18 years ago. Her smoking use included Cigarettes. She has a 2 pack-year smoking history. She has never used smokeless tobacco.   Review of Systems  Constitutional: Negative for fever and unexpected weight change.  HENT: Negative for congestion, dental problem, ear pain, nosebleeds, postnasal drip, rhinorrhea, sinus pressure, sneezing, sore throat and trouble swallowing.   Eyes: Negative for redness and itching.  Respiratory: Positive for cough. Negative for chest tightness, shortness of breath and wheezing.   Cardiovascular: Negative for palpitations and leg swelling.  Gastrointestinal: Negative for nausea and vomiting.  Genitourinary: Negative for dysuria.  Musculoskeletal: Negative for joint swelling.  Skin: Negative for rash.  Neurological: Negative for headaches.  Hematological: Does not bruise/bleed easily.  Psychiatric/Behavioral: Negative for dysphoric mood. The patient is not nervous/anxious.        Objective:   Physical Exam  Vitals  reviewed. Constitutional: She is oriented to person, place, and time. She appears well-developed and well-nourished. No distress.  HENT:  Head: Normocephalic and atraumatic.  Right Ear: External ear normal.  Left Ear: External ear normal.  Mouth/Throat: Oropharynx is clear and moist. No oropharyngeal exudate.  Eyes: Conjunctivae and EOM are  normal. Pupils are equal, round, and reactive to light. Right eye exhibits no discharge. Left eye exhibits no discharge. No scleral icterus.  Neck: Normal range of motion. Neck supple. No JVD present. No tracheal deviation present. No thyromegaly present.  Cardiovascular: Normal rate, regular rhythm, normal heart sounds and intact distal pulses.  Exam reveals no gallop and no friction rub.   No murmur heard. Pulmonary/Chest: Effort normal and breath sounds normal. No respiratory distress. She has no wheezes. She has no rales. She exhibits no tenderness.  Abdominal: Soft. Bowel sounds are normal. She exhibits no distension and no mass. There is no tenderness. There is no rebound and no guarding.  Musculoskeletal: Normal range of motion. She exhibits no edema and no tenderness.  Lymphadenopathy:    She has no cervical adenopathy.  Neurological: She is alert and oriented to person, place, and time. She has normal reflexes. No cranial nerve deficit. She exhibits normal muscle tone. Coordination normal.  Skin: Skin is warm and dry. Rash noted. She is not diaphoretic. No erythema. No pallor.  New rash in back - going to see derm  Psychiatric: She has a normal mood and affect. Her behavior is normal. Judgment and thought content normal.     Filed Vitals:   06/22/14 1613  BP: 112/76  Pulse: 84  Height: 5\' 5"  (1.651 m)  Weight: 158 lb (71.668 kg)  SpO2: 98%        Assessment & Plan:  Due to recent onset of hemoptysis, will do CT chest with contrast

## 2014-06-22 NOTE — Patient Instructions (Addendum)
#  Hemoptysis  - I will discuss with radiologist about repeat imaging of chest and get back to you

## 2014-06-29 ENCOUNTER — Telehealth: Payer: Self-pay | Admitting: Internal Medicine

## 2014-06-29 NOTE — Telephone Encounter (Signed)
Let patient know that I d/w radiologist and we think the coughin of blood was due to infection in area of bronchiectasis. We can watch it and she can ROV in 4 months (you need to give her appt) but if she is very concerned she can do CT chest with contrast now for hemoptysis

## 2014-07-03 NOTE — Telephone Encounter (Signed)
Called and spoke to pt. Informed pt of recs per MR. Pt stated she would like to hold off on the CT. Pt stated she will call back if she has another occurrence of hemoptysis. 4 month recall placed. Nothing further needed.

## 2014-07-08 ENCOUNTER — Telehealth: Payer: Self-pay | Admitting: Internal Medicine

## 2014-07-08 MED ORDER — AZITHROMYCIN 250 MG PO TABS: ORAL_TABLET | ORAL | Status: DC

## 2014-07-08 NOTE — Telephone Encounter (Signed)
Ok to send Z PAK with1 refill  Dr. Brand Males, M.D., [facility].C.P Pulmonary and Critical Care Medicine Staff Physician [place] [facility] Pager: [phone], If no answer or between  15:00h - 7:00h: call [phone]  07/08/2014 10:30 AM

## 2014-07-08 NOTE — Telephone Encounter (Signed)
Called spoke with patient and discussed MR's recommendations with her Pt verbalized her understanding and denied any questions/concerns at this time Rx sent to [facility] (verified this with pt as was documented incorrectly before) Pt aware to contact the office if her symptoms do not improve or they worsen Nothing further needed at this time; will sign off

## 2014-07-08 NOTE — Telephone Encounter (Signed)
Last ov 8.24.15 w/ MR Called spoke with patient who reported an episode of hemoptysis last evening at 10pm.  She coughed several times over the course of 10-15 minutes with BRB, mixed with some clear mucus.  Pt reports the amount being "more than normal" but was unable to give a quantity as she coughed into a paper towel.  Pt denies any f/c/s, n/v/d, increased SOB, wheezing or chest tightness.  She is currently in [place] and unable to come back to [place] at this time for an ov or cxr.  Pt is requesting a zpak as this has helped her in the past.  Allergies  Allergen Reactions  . Codeine Nausea Only  . Propranolol Hcl     REACTION: DIZZY   [facility] in [place] [phone]  MR please advise, thank you. **MR paged at 9:01am

## 2014-08-25 ENCOUNTER — Encounter: Payer: Self-pay | Admitting: Internal Medicine

## 2014-08-31 ENCOUNTER — Encounter: Payer: Self-pay | Admitting: Internal Medicine

## 2014-09-01 ENCOUNTER — Encounter: Payer: Self-pay | Admitting: Gynecology

## 2014-09-07 ENCOUNTER — Telehealth: Payer: Self-pay | Admitting: Gastroenterology

## 2014-09-07 NOTE — Telephone Encounter (Signed)
Spoke with patient and she prefers Dr Hilarie Fredrickson for new GI MD. She reports change in bowels x 2 weeks. 2 weeks ago, she had hard stools and took Senna. Stools became liquid then soft and stringy. States she is having small stools now. Reports abdominal pain under breast, at belly button and lower left side that comes and goes. Scheduled with Alonza Bogus, PA on 11/111/15 at 2:00 PM.

## 2014-09-09 ENCOUNTER — Other Ambulatory Visit (INDEPENDENT_AMBULATORY_CARE_PROVIDER_SITE_OTHER): Payer: Medicare Other

## 2014-09-09 ENCOUNTER — Encounter: Payer: Self-pay | Admitting: Gastroenterology

## 2014-09-09 ENCOUNTER — Ambulatory Visit (INDEPENDENT_AMBULATORY_CARE_PROVIDER_SITE_OTHER): Payer: Medicare Other | Admitting: Gastroenterology

## 2014-09-09 VITALS — BP 112/72 | HR 88 | Ht 64.17 in | Wt 157.5 lb

## 2014-09-09 DIAGNOSIS — R109 Unspecified abdominal pain: Secondary | ICD-10-CM

## 2014-09-09 DIAGNOSIS — Z800 Family history of malignant neoplasm of digestive organs: Secondary | ICD-10-CM

## 2014-09-09 DIAGNOSIS — R194 Change in bowel habit: Secondary | ICD-10-CM

## 2014-09-09 LAB — CBC WITH DIFFERENTIAL/PLATELET
Basophils Absolute: 0.1 10*3/uL (ref 0.0–0.1)
Basophils Relative: 0.8 % (ref 0.0–3.0)
Eosinophils Absolute: 0.1 10*3/uL (ref 0.0–0.7)
Eosinophils Relative: 2 % (ref 0.0–5.0)
HCT: 37.2 % (ref 36.0–46.0)
HEMOGLOBIN: 12.6 g/dL (ref 12.0–15.0)
LYMPHS PCT: 30.8 % (ref 12.0–46.0)
Lymphs Abs: 2 10*3/uL (ref 0.7–4.0)
MCHC: 33.9 g/dL (ref 30.0–36.0)
MCV: 89.4 fl (ref 78.0–100.0)
Monocytes Absolute: 0.8 10*3/uL (ref 0.1–1.0)
Monocytes Relative: 11.6 % (ref 3.0–12.0)
NEUTROS ABS: 3.6 10*3/uL (ref 1.4–7.7)
NEUTROS PCT: 54.8 % (ref 43.0–77.0)
Platelets: 323 10*3/uL (ref 150.0–400.0)
RBC: 4.16 Mil/uL (ref 3.87–5.11)
RDW: 13 % (ref 11.5–15.5)
WBC: 6.6 10*3/uL (ref 4.0–10.5)

## 2014-09-09 LAB — LIPASE: Lipase: 29 U/L (ref 11.0–59.0)

## 2014-09-09 LAB — COMPREHENSIVE METABOLIC PANEL
ALT: 16 U/L (ref 0–35)
AST: 14 U/L (ref 0–37)
Albumin: 3.8 g/dL (ref 3.5–5.2)
Alkaline Phosphatase: 86 U/L (ref 39–117)
BUN: 18 mg/dL (ref 6–23)
CALCIUM: 9.3 mg/dL (ref 8.4–10.5)
CHLORIDE: 102 meq/L (ref 96–112)
CO2: 23 mEq/L (ref 19–32)
Creatinine, Ser: 1 mg/dL (ref 0.4–1.2)
GFR: 57.54 mL/min — ABNORMAL LOW (ref >60.00)
Glucose, Bld: 86 mg/dL (ref 70–99)
Potassium: 4 mEq/L (ref 3.5–5.1)
Sodium: 136 mEq/L (ref 135–145)
Total Bilirubin: 0.3 mg/dL (ref 0.2–1.2)
Total Protein: 7 g/dL (ref 6.0–8.3)

## 2014-09-09 MED ORDER — MOVIPREP 100 G PO SOLR: 1.0000 | Freq: Once | ORAL | Status: DC

## 2014-09-09 NOTE — Patient Instructions (Addendum)
You have been scheduled for a colonoscopy. Please follow written instructions given to you at your visit today.  Please pick up your prep kit at the pharmacy within the next 1-3 days. If you use inhalers (even only as needed), please bring them with you on the day of your procedure. Your physician has requested that you go to www.startemmi.com and enter the access code given to you at your visit today. This web site gives a general overview about your procedure. However, you should still follow specific instructions given to you by our office regarding your preparation for the procedure.  Your physician has requested that you go to the basement for the following lab work before leaving today: CBC CMET Lipase _______________________________________________________________________________________________________________________________________________________________  Cynthia Thornton have been scheduled for an abdominal ultrasound at [facility] (1st floor of hospital) on 09-15-2014 at 3:30 pm.Please arrive 15 minutes prior to your appointment for registration. Should you need to reschedule your appointment, please contact radiology at [phone]. This test typically takes about 30 minutes to perform.   Please follow the written instructions below on the day of your exam:  WARNING: IF YOU ARE ALLERGIC TO IODINE/X-RAY DYE, PLEASE NOTIFY RADIOLOGY IMMEDIATELY AT [phone]! YOU WILL BE GIVEN A 13 HOUR PREMEDICATION PREP.  1) Do not eat or drink anything after 11:30 (4 hours prior to your test) 2) You have been given 2 bottles of oral contrast to drink. The solution may taste better if refrigerated, but do NOT add ice or any other liquid to this solution. Shake well before drinking.    Drink 1 bottle of contrast @ 1:30 pm (2 hours prior to your exam)  Drink 1 bottle of contrast @ 2:30 pm (1 hour prior to your exam)  You may take any medications as prescribed with a small amount of water except  for the following: Metformin, Glucophage, Glucovance, Avandamet, Riomet, Fortamet, Actoplus Met, Janumet, Glumetza or Metaglip. The above medications must be held the day of the exam AND 48 hours after the exam.  The purpose of you drinking the oral contrast is to aid in the visualization of your intestinal tract. The contrast solution may cause some diarrhea. Before your exam is started, you will be given a small amount of fluid to drink. Depending on your individual set of symptoms, you may also receive an intravenous injection of x-ray contrast/dye.  ________________________________________________________________________

## 2014-09-10 DIAGNOSIS — R194 Change in bowel habit: Secondary | ICD-10-CM | POA: Insufficient documentation

## 2014-09-10 DIAGNOSIS — Z800 Family history of malignant neoplasm of digestive organs: Secondary | ICD-10-CM | POA: Insufficient documentation

## 2014-09-10 DIAGNOSIS — R109 Unspecified abdominal pain: Secondary | ICD-10-CM | POA: Insufficient documentation

## 2014-09-10 HISTORY — DX: Family history of malignant neoplasm of digestive organs: Z80.0

## 2014-09-10 HISTORY — DX: Change in bowel habit: R19.4

## 2014-09-10 NOTE — Progress Notes (Addendum)
     09/10/2014 Cynthia Thornton [id number] 27-Mar-1946   History of Present Illness:  This is a pleasant 68 year old female who is previously known to Dr. Sharlett Iles.  Her last colonoscopy was in 09/2009 at which time the study was normal, but it was recommended that she have another procedure in 5 years due to family history of colon cancer in her father.  EGD 08/2012 was normal with negative Hpylori.    She presents to the office today with complaints of abdominal pain.  She says that the pains have been present for 2 weeks or more and have been becoming worse.  The pains are present constantly but worsen throughout the day.  She has LUQ abdominal pain, high under her ribcage, but also had mid-abdominal pain just to the left of her umbilicus.  She says that the pains feel different mostly in the fact that the lower pain is much more intense.  They both started around the same time but worsen at different times throughout the day.  She says that it is not necessarily consistent if the pains worsen with eating or not.  She denies nausea, vomiting.  She denies NSAID use.  She takes Nexium daily and uses famotidine on occasion if needed.    She also reports some change in her bowel habits, but somewhat dismisses it, saying that she just wanted to mention it while she was here.  Says that it happens on occasion that her bowels are off.  Recently she had a bout of constipation so she took some senna, which caused diarrhea.  Now she is having just small pieces of stool, not all formed together.   Current Medications, Allergies, Past Medical History, Past Surgical History, Family History and Social History were reviewed in [employer] record.   Physical Exam: BP 112/72 mmHg  Pulse 88  Ht 5' 4.17" (1.63 m)  Wt 157 lb 8 oz (71.442 kg)  BMI 26.89 kg/m2 General: Well developed, Jia female in no acute distress Head: Normocephalic and atraumatic Eyes:  Sclerae anicteric, conjunctiva  pink  Ears: Normal auditory acuity Lungs: Clear throughout to auscultation Heart: Regular rate and rhythm Abdomen: Soft, non-distended.  Normal bowel sounds.  LUQ and mid-abdominal TTP to the left of the umbilicus. Musculoskeletal: Symmetrical with no gross deformities  Extremities: No edema  Neurological: Alert oriented x 4, grossly non-focal Psychological:  Alert and cooperative. Normal mood and affect  Assessment and Recommendations: -Abdominal pain:  LUQ, high under rib cage and mid-abdomen just to the left of the umbilicus.  Unsure of the cause of these pains.  Will check labs today including CBC, CMP, and lipase.  Will check CT scan of the abdomen and pelvis with contrast.  I offered her an anti-spasmodic in the interim, however, she declined for now and wanted to wait for evaluation. -Family history of colon cancer:  Already scheduled for colonoscopy.  Will cancel her pre-visit and go over her instructions today.  The risks, benefits, and alternatives were discussed with the patient and she consents to proceed.  -Change in bowel habits with some constipation:  Seems mild, possibly transient issue.  Patient did not seem very distressed by this at this point.  Recommended a daily fiber supplement with plenty of liquids.   Addendum: Reviewed and agree with initial management. Jerene Bears, MD

## 2014-09-15 ENCOUNTER — Encounter (HOSPITAL_COMMUNITY): Payer: Self-pay

## 2014-09-15 ENCOUNTER — Ambulatory Visit (HOSPITAL_COMMUNITY)
Admission: RE | Admit: 2014-09-15 | Discharge: 2014-09-15 | Disposition: A | Discharge status: Home / Self Care | Payer: Medicare Other | Source: Ambulatory Visit | Attending: Gastroenterology | Admitting: Gastroenterology

## 2014-09-15 DIAGNOSIS — M419 Scoliosis, unspecified: Secondary | ICD-10-CM | POA: Insufficient documentation

## 2014-09-15 DIAGNOSIS — Z90710 Acquired absence of both cervix and uterus: Secondary | ICD-10-CM | POA: Diagnosis not present

## 2014-09-15 DIAGNOSIS — R109 Unspecified abdominal pain: Secondary | ICD-10-CM

## 2014-09-15 DIAGNOSIS — M5134 Other intervertebral disc degeneration, thoracic region: Secondary | ICD-10-CM | POA: Diagnosis not present

## 2014-09-15 DIAGNOSIS — J479 Bronchiectasis, uncomplicated: Secondary | ICD-10-CM | POA: Insufficient documentation

## 2014-09-15 DIAGNOSIS — D7389 Other diseases of spleen: Secondary | ICD-10-CM | POA: Diagnosis not present

## 2014-09-15 DIAGNOSIS — R1012 Left upper quadrant pain: Secondary | ICD-10-CM | POA: Insufficient documentation

## 2014-09-15 DIAGNOSIS — K7689 Other specified diseases of liver: Secondary | ICD-10-CM | POA: Diagnosis not present

## 2014-09-15 MED ORDER — IOHEXOL 300 MG/ML  SOLN
100.0000 mL | Freq: Once | INTRAMUSCULAR | Status: AC | PRN
  Administered 2014-09-15: 100 mL via INTRAVENOUS

## 2014-09-18 ENCOUNTER — Other Ambulatory Visit: Payer: Self-pay | Admitting: *Deleted

## 2014-09-18 DIAGNOSIS — R932 Abnormal findings on diagnostic imaging of liver and biliary tract: Secondary | ICD-10-CM

## 2014-09-21 ENCOUNTER — Encounter: Payer: Self-pay | Admitting: Internal Medicine

## 2014-09-23 ENCOUNTER — Other Ambulatory Visit: Payer: Self-pay | Admitting: *Deleted

## 2014-09-23 MED ORDER — OMEPRAZOLE 40 MG PO CPDR: 40.0000 mg | DELAYED_RELEASE_CAPSULE | Freq: Every day | ORAL | Status: DC

## 2014-11-02 ENCOUNTER — Encounter: Payer: Self-pay | Admitting: Internal Medicine

## 2014-11-02 ENCOUNTER — Ambulatory Visit (AMBULATORY_SURGERY_CENTER): Payer: Medicare Other | Admitting: Internal Medicine

## 2014-11-02 VITALS — BP 124/76 | HR 63 | Temp 98.6°F | Resp 16 | Ht 64.0 in | Wt 157.0 lb

## 2014-11-02 DIAGNOSIS — Z1211 Encounter for screening for malignant neoplasm of colon: Secondary | ICD-10-CM | POA: Diagnosis not present

## 2014-11-02 DIAGNOSIS — D122 Benign neoplasm of ascending colon: Secondary | ICD-10-CM | POA: Diagnosis not present

## 2014-11-02 DIAGNOSIS — D126 Benign neoplasm of colon, unspecified: Secondary | ICD-10-CM

## 2014-11-02 DIAGNOSIS — Z800 Family history of malignant neoplasm of digestive organs: Secondary | ICD-10-CM | POA: Diagnosis not present

## 2014-11-02 DIAGNOSIS — D127 Benign neoplasm of rectosigmoid junction: Secondary | ICD-10-CM | POA: Diagnosis not present

## 2014-11-02 DIAGNOSIS — D123 Benign neoplasm of transverse colon: Secondary | ICD-10-CM | POA: Diagnosis not present

## 2014-11-02 MED ORDER — SODIUM CHLORIDE 0.9 % IV SOLN: 500.0000 mL | INTRAVENOUS | Status: DC

## 2014-11-02 NOTE — Patient Instructions (Addendum)
AVOID ALL NSAIDS  (MOTRIN,ALEVE,ADVIL ETC), January 18,2016.    YOU HAD AN ENDOSCOPIC PROCEDURE TODAY AT [facility]: Refer to the procedure report that was given to you for any specific questions about what was found during the examination.  If the procedure report does not answer your questions, please call your gastroenterologist to clarify.  If you requested that your care partner not be given the details of your procedure findings, then the procedure report has been included in a sealed envelope for you to review at your convenience later.  YOU SHOULD EXPECT: Some feelings of bloating in the abdomen. Passage of more gas than usual.  Walking can help get rid of the air that was put into your GI tract during the procedure and reduce the bloating. If you had a lower endoscopy (such as a colonoscopy or flexible sigmoidoscopy) you may notice spotting of blood in your stool or on the toilet paper. If you underwent a bowel prep for your procedure, then you may not have a normal bowel movement for a few days.  DIET: Your first meal following the procedure should be a light meal and then it is ok to progress to your normal diet.  A half-sandwich or bowl of soup is an example of a good first meal.  Heavy or fried foods are harder to digest and may make you feel nauseous or bloated.  Likewise meals heavy in dairy and vegetables can cause extra gas to form and this can also increase the bloating.  Drink plenty of fluids but you should avoid alcoholic beverages for 24 hours.  ACTIVITY: Your care partner should take you home directly after the procedure.  You should plan to take it easy, moving slowly for the rest of the day.  You can resume normal activity the day after the procedure however you should NOT DRIVE or use heavy machinery for 24 hours (because of the sedation medicines used during the test).    SYMPTOMS TO REPORT IMMEDIATELY: A gastroenterologist can be reached at any hour.  During  normal business hours, 8:30 AM to 5:00 PM Monday through Friday, call [phone].  After hours and on weekends, please call the GI answering service at [phone] who will take a message and have the physician on call contact you.   Following lower endoscopy (colonoscopy or flexible sigmoidoscopy):  Excessive amounts of blood in the stool  Significant tenderness or worsening of abdominal pains  Swelling of the abdomen that is new, acute  Fever of 100F or higher  FOLLOW UP: If any biopsies were taken you will be contacted by phone or by letter within the next 1-3 weeks.  Call your gastroenterologist if you have not heard about the biopsies in 3 weeks.  Our staff will call the home number listed on your records the next business day following your procedure to check on you and address any questions or concerns that you may have at that time regarding the information given to you following your procedure. This is a courtesy call and so if there is no answer at the home number and we have not heard from you through the emergency physician on call, we will assume that you have returned to your regular daily activities without incident.  SIGNATURES/CONFIDENTIALITY: You and/or your care partner have signed paperwork which will be entered into your electronic medical record.  These signatures attest to the fact that that the information above on your After Visit Summary has been reviewed  and is understood.  Full responsibility of the confidentiality of this discharge information lies with you and/or your care-partner. 

## 2014-11-02 NOTE — Progress Notes (Signed)
Called to room to assist during endoscopic procedure.  Patient ID and intended procedure confirmed with present staff. Received instructions for my participation in the procedure from the performing physician.  

## 2014-11-02 NOTE — Op Note (Signed)
[place]  [address]. [place], [id number]   COLONOSCOPY PROCEDURE REPORT  PATIENT: Cynthia, Thornton  MR#: [id number] BIRTHDATE: 09-16-1946 , 68  yrs. old GENDER: female ENDOSCOPIST: Jerene Bears, MD PROCEDURE DATE:  11/02/2014 PROCEDURE:   Colonoscopy with snare polypectomy and Colonoscopy with cold biopsy polypectomy First Screening Colonoscopy - Avg.  risk and is 50 yrs.  old or older - No.  Prior Negative Screening - Now for repeat screening. Less than 10 yrs Prior Negative Screening - Now for repeat screening.  Above average risk  History of Adenoma - Now for follow-up colonoscopy & has been > or = to 3 yrs.  N/A  Polyps Removed Today? Yes. ASA CLASS:   Class II INDICATIONS:patient's immediate family history of colon cancer and last colonoscopy completed 5 years ago. MEDICATIONS: Monitored anesthesia care and Propofol 300 mg IV  DESCRIPTION OF PROCEDURE:   After the risks benefits and alternatives of the procedure were thoroughly explained, informed consent was obtained.  The digital rectal exam revealed no rectal mass.   The LB [id number]  endoscope was introduced through the anus and advanced to the cecum, which was identified by both the appendix and ileocecal valve. No adverse events experienced. The quality of the prep was good, using MoviPrep  The instrument was then slowly withdrawn as the colon was fully examined.  COLON FINDINGS: A sessile polyp measuring 6 mm in size with a mucous cap was found in the ascending colon.  A polypectomy was performed with a cold snare.  The resection was complete, the polyp tissue was completely retrieved and sent to histology.   Two sessile polyps ranging between 3-51mm in size were found in the transverse colon and ascending colon.  Polypectomies were performed with a cold snare.   A sessile polyp measuring 3 mm in size was found in the rectosigmoid colon.  A polypectomy was performed with cold forceps.  The  resection was complete, the polyp tissue was completely retrieved and sent to histology.  Retroflexed views revealed internal hemorrhoids and hypertrophied anal papillae. The time to cecum=5 minutes 58 seconds.  Withdrawal time=14 minutes 03 seconds.  The scope was withdrawn and the procedure completed. COMPLICATIONS: There were no immediate complications.  ENDOSCOPIC IMPRESSION: 1.   Sessile polyp was found in the ascending colon; polypectomy was performed with a cold snare 2.   Two sessile polyps ranging between 3-76mm in size were found in the transverse colon and ascending colon; polypectomies were performed with a cold snare 3.   Sessile polyp was found in the rectosigmoid colon; polypectomy was performed with cold forceps  RECOMMENDATIONS: 1.  Await pathology results 2.  Avoid all NSAIDS for the next 2 weeks. 3.  Timing of repeat colonoscopy will be determined by pathology findings. 4.  You will receive a letter within 1-2 weeks with the results of your biopsy as well as final recommendations.  Please call my office if you have not received a letter after 3 weeks.  eSigned:  Jerene Bears, MD 11/02/2014 10:53 AM      cc: The Patient and Kathlene November, MD

## 2014-11-02 NOTE — Progress Notes (Signed)
Procedure endfs, to recovery, report given and VSS.

## 2014-11-03 ENCOUNTER — Telehealth: Payer: Self-pay | Admitting: *Deleted

## 2014-11-03 NOTE — Telephone Encounter (Signed)
  Follow up Call-  Call back number 11/02/2014 09/18/2012  Post procedure Call Back phone  # [phone]  Permission to leave phone message Yes Yes     Patient questions:  Do you have a fever, pain , or abdominal swelling? No. Pain Score  0 *  Have you tolerated food without any problems? Yes.    Have you been able to return to your normal activities? Yes.    Do you have any questions about your discharge instructions: Diet   No. Medications  No. Follow up visit  No.  Do you have questions or concerns about your Care? No.  Actions: * If pain score is 4 or above: No action needed, pain <4.

## 2014-11-11 ENCOUNTER — Encounter: Payer: Self-pay | Admitting: Internal Medicine

## 2014-11-19 ENCOUNTER — Encounter: Payer: Medicare Other | Admitting: Gynecology

## 2014-12-03 ENCOUNTER — Telehealth: Payer: Self-pay | Admitting: Internal Medicine

## 2014-12-03 NOTE — Telephone Encounter (Signed)
Received call from patient about coughing up large amount of blood. No sob, no cp, no fever. Started on 2/3 morning with small flecks of blood, gradually getting worst. Review of chart show that patient has hx of bronchiectasis, and hemoptysis, that usually responds to zpak. Discuss options (ED visit vs Zpak/Acute care visit) with the patient; she will observe closely tonight and start zpak, if hemoptysis worsens will goto [facility] ED. Dr. Chase Caller has seen in the patient in the recent past, and discussed possible bronchoscopy with patient if hemoptysis occurs again.   Zpak called into [facility] ([phone])

## 2014-12-04 ENCOUNTER — Other Ambulatory Visit: Payer: Self-pay | Admitting: Internal Medicine

## 2014-12-04 ENCOUNTER — Ambulatory Visit (INDEPENDENT_AMBULATORY_CARE_PROVIDER_SITE_OTHER): Payer: Medicare Other | Admitting: Internal Medicine

## 2014-12-04 ENCOUNTER — Ambulatory Visit (INDEPENDENT_AMBULATORY_CARE_PROVIDER_SITE_OTHER)
Admission: RE | Admit: 2014-12-04 | Discharge: 2014-12-04 | Disposition: A | Discharge status: Home / Self Care | Payer: Medicare Other | Source: Ambulatory Visit | Attending: Internal Medicine | Admitting: Internal Medicine

## 2014-12-04 ENCOUNTER — Encounter: Payer: Self-pay | Admitting: Internal Medicine

## 2014-12-04 VITALS — BP 128/70 | HR 82 | Ht 64.5 in | Wt 162.2 lb

## 2014-12-04 DIAGNOSIS — R042 Hemoptysis: Secondary | ICD-10-CM

## 2014-12-04 DIAGNOSIS — J471 Bronchiectasis with (acute) exacerbation: Secondary | ICD-10-CM

## 2014-12-04 NOTE — Assessment & Plan Note (Signed)
Chest x-ray does not demonstrate obvious parenchymal disease. We discussed role of acute bronchitis exacerbating bronchiectasis as a basis for hemoptysis. Plan-finish Z-Pak.

## 2014-12-04 NOTE — Assessment & Plan Note (Signed)
Hemoptysis probably associated with exacerbation of bronchitis and areas of bronchiectasis. No other risks or triggers identified. She will talk with Dr. Chase Caller about further measures when she sees him next. Current episode appears self-limited.

## 2014-12-04 NOTE — Progress Notes (Signed)
Subjective:    Patient ID: Cynthia Thornton, female    DOB: May 13, 1946, 69 y.o.   MRN: [id number]  HPI   se Cynthia Kells, MD    reports that she quit [phone] - Her smoking use included Cigarettes. She has a 2 pack-year smoking history. She has never used smokeless tobacco.   IOV 07/26/2012   69 year old female. 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  Reports chronic cough x 7 months ([facility] 2013). Insidious onset. Does not remember preceding URI prior to onset of cough.   There is always associated hoarseness of voice,  clearing of throat, mucus in throat that is small in amount and she has difficulty getting it out (is creamy in color) which is all progressive.  No associated tickle in throat or sinus drainage. Talking makes symptoms worse. Says giving hx to me in office makes her voice hoarse. End of day makes her worse due to talking. SHe is frustrated that because talking is her job and talking make symptoms worse and she is unable to execute her job.   GERD hx  Has had GERD since 2003 and on nexium since. In July 2013 took medical help for cough and referred to  ENT eval: per her hx they told her it was GERD related cough but shey says that GERD symptoms are different from current cough. In terms of diet: drink tea, drinks diet pepsi 2-4 cans per day, but avoids pizza, fried food. Will have cheese on salad. Has only occasional chocolate. Eats bran cereal a day. Lunch is fruit and soup.   SINUS hx Denies post nasal sinus drainage. Denies tickle down throat ENT eval per hx in July 2013: normal sinuses. Trial of flonase July-August 2013: tried just one squirt and she did not like the sensation. So never used it.   ACE INHIBITOR Denies associated ACE inhibitor intake   SMOKING  Hx  1-3 cigs per day. Quit 16 years ago at age 36. Started smoking age 89.   FAm Hx  - mom died of lung cancer and had cough. Patient very concerned that cough is due to lung  cancer. I believe she feels guilty she might have missed her mom's cancer when her mom complained of chronic cough. She prefers upfront CT chest   CXR 05/30/12 1. No acute cardiopulmonary disease.  2. Hyperexpanded lungs with minimal linear heterogeneous opacities  within the right middle lobe and lingula, nonspecific possibly  atelectasis or scar, though chronic atypical infection (MAI) may  have a similar appearance.  Original Report Authenticated By: Rachel Moulds, M.D.  REC Please have CT chest without contrast  Please have breathing test called PFT  Return to see me after above     OV 10/11/2012 Fu cough  -Since last visit, without interventions , cough has improved 40%. She says drier weather was the reason for improvement. Says, more than cough, clearing of throat is the problem. REsults below of CT and PFT reviewed with patient. AT this stage, she feels she wants expectant Rx only. Not interested in further testing or intervention   PFT - normal   IMPRESSION:  CT chest 08/02/12 1. No acute cardiopulmonary abnormalities.  2. Lingular bronchiectasis  3. There are two clustered areas of nodularity within the  periphery of both lungs. These do not have worrisome features and  likely the sequela of atypical infection.  4. Liver and splenic hypodensities were present on the exam from  10/07/2004 and likely reflect a combination of benign liver cyst  and hemangiomas.  Original Report Authenticated By: Angelita Ingles, M.D.  Glad you are much better Continue to control acid reflux You can even try voice rest for few days if cough returns As discussed and agreed, we are holding off further testing or treatment interventions Call [phone] and return if cough becomes a problem   09/24/2013 f/u ov/Wert re:  Chief Complaint  Patient presents with  . Acute Visit    Pt c/o hemoptysis x 2 days- She states that this is the first time she has had this problem. No change in her  breathing or other co's.    states she was not aware of dx of bronchiectasis or implications re risk of hemoptysis. Sputum did not really turn purulent before blood but the total amt is < tsp, no assoc epistaxis or sinus complaints     CTG sinus 09/30/13:  Mild mucosal thickening base of left maxillary sinus. No air-fluid  level.  REC Nexium + pepcid    OV 10/10/2013 ov/ [name]   Chief Complaint  Patient presents with  . Cough    follow-up.  Pt states she has not coughed up any more blood. She states cough is improved, but she does c/o having a "stuffy nose" all day.   Followup chronic cough; multifactorial in the setting of lingular bronchiectasis  Cough never really went away. I am seeing her after a year. A month ago she did see Dr. Legrand Como wert having acute onset of hemoptysis one time. Chest x-ray was unremarkable and CT scan of the sinus showed mild sinus thickening but otherwise unremarkable. It was felt that the hemoptysis was from her lingular bronchiectasis. However, she did not have any infection at that time. Currently cough persist. She points to her throat. She clears her throat all the time. Cough is laryngeal and quality. Mostly dry but occasionally she does bring mucus. She is compliant with her antihistamine and Nexium and Pepcid. RSI cough score is 10  This  Time she is open to Rx and getting rid of cough.   rec Cough is from possible sinus drainage/allergies, possible acid reflux, possible  asthma,  All of this is working together to cause cyclical cough/LPR cough  #Sinus drainage  - start  Hypertonic saline nasal spray -  take generic fluticasone inhaler 2 squirts each nostril daily - continue anti histamime  # Acid Reflux  -continue nexium and pepcid - avoid colas, spices, cheeses, spirits, red meats, beer, chocolates, fried foods etc.,   - sleep with head end of bed elevated  - eat small frequent meals  - do not go to bed for 3 hours after last  meal  # Poissible Asthma   - do methacholine challenge test  #Vocal Cord Dysfunction or Irritable Larynx  - will refer you to speech therapy with Mr Garald Balding -  please choose 3 days and observe complete voice rest - no talking or whispering  - at all times there  there is urge to cough, drink water or swallow or sip on throat lozenge  - start neurontin 300mg  po daily  X 3 days, then 300mg  po twice daily x 3 days, then 300mg  po three times daily to continue. 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 Acute Visit    Pt states had hemoptysis yesterday while on her way home from work. She went to ED and had cxr and was started on zithromax. She c/o chest tightness that she relates to anxiety since this did not start until after hemoptysis.   not a tablespoon total of blood and no antecedent purulent sputum or epistxis/cp or sob zpak started one day  prior to OV     No obvious day to day or daytime variabilty or assoc   subjective wheeze overt sinus or hb symptoms. No unusual exp hx or h/o childhood pna/ asthma or knowledge of premature birth.  Sleeping ok without nocturnal  or early am exacerbation  of respiratory  c/o's or need for noct saba. Also denies any obvious fluctuation of symptoms with weather or environmental changes or other aggravating or alleviating factors except as outlined above  Cough  REC Please schedule a follow up visit in 3 months but call sooner if needed with DR Chase Caller to discuss longterm management of your problem  OV 12/04/2014  Chief Complaint  Patient presents with  . Hemoptysis    ACUTE VISIT: MR patient; coughing up blood; CXR done today and printe for  review. Denies any wheezing or SOB         Followup chronic cough. [name] tells me that she no longer has chronic cough. However, in November 2014 she had a new onset of hemoptysis out of the blue. She showed me pictures it looks like she had flecks of hemoptysis. There is no associated infectious symptoms at that time. Episodes that'll spontaneously. Then again the same thing happened in May 2015. There was no associated fever, chest pain, syncope, wheezing, cough, yellow sputum. In between these episodes she has remained largely free of chronic cough except for some occasional mucus every few weeks./ D/w radiologist Dr Weber Cooks : based on prior imaging odds of pulmonary AVM low and hemoptuysis likely from bronchiectasis.  - A CT scan of the chest was in October 2013 that showed mild amount of lingular bronchiectasis   smoking:  reports that she quit smoking about 19 years ago. Her smoking use included Cigarettes. She has a 2 pack-year smoking history. She has never used smokeless tobacco.  12/04/14- Acute OV ACUTE VISIT: MR patient, former smoker, followed for bronchiectasis; coughing up blood; CXR done today and printed for review. Denies any wheezing or SOB  Last night she was sitting in a recliner after dinner, watching TV quietly when she began coughing up blood. Little cough or activity beforehand. More blood this time than she has seen before-scared her. She spoke to on-call doctor who sent Z-Pak. Bleeding almost stopped by this morning. No admixed purulent mucus, chest pain, unusual bruising or other bleeding, or fever. Previous episodes of hemoptysis one year ago and in April and September 2015, all self limited she has not been on anticoagulants/aspirin but uses occasional NSAID. CXR 12/04/14-images reviewed with her FINDINGS: The lungs are mildly hyperinflated. There is no focal infiltrate. There is no pulmonary parenchymal mass nor abnormal nodule. There is no pleural effusion. The  heart and pulmonary vascularity are normal. There is mild dextro curvature of the thoracolumbar spine which is stable. IMPRESSION: COPD. There is no acute cardiopulmonary abnormality. If the patient's hemoptysis persists, chest CT scanning is recommended. Electronically Signed  By: David Martinique  On: 12/04/2014 10:14   Review of Systems  Constitutional: Negative for fever and unexpected weight change.  HENT: Negative for congestion, dental problem, ear pain, nosebleeds, postnasal drip, rhinorrhea, sinus pressure, sneezing, sore throat and trouble swallowing.   Eyes: Negative for redness and itching.  Respiratory: Positive for cough. Negative for chest tightness, shortness of breath and wheezing.   Cardiovascular: Negative for palpitations and leg swelling.  Gastrointestinal: Negative for nausea and vomiting.  Genitourinary: Negative for dysuria.  Musculoskeletal: Negative for joint swelling.  Skin: Negative for rash.  Neurological: Negative for headaches.  Hematological: Does not bruise/bleed easily.  Psychiatric/Behavioral: Negative for dysphoric mood. The patient is not nervous/anxious.        Objective:   Physical Exam  Constitutional: She is oriented to person, place, and time. She appears well-developed and well-nourished. No distress.  HENT:  Head: Normocephalic and atraumatic.  Right Ear: External ear normal.  Left Ear: External ear normal.  Mouth/Throat: Oropharynx is clear and moist. No oropharyngeal exudate.  Eyes: Conjunctivae and EOM are normal. Pupils are equal, round, and reactive to light. Right eye exhibits no discharge. Left eye exhibits no discharge. No scleral icterus.  Neck: Normal range of motion. Neck supple. No JVD present. No tracheal deviation present. No thyromegaly present.  Cardiovascular: Normal rate, regular rhythm, normal heart sounds and intact distal pulses.  Exam reveals no gallop and no friction rub.   No murmur heard. Pulmonary/Chest:  Effort normal and breath sounds normal. No respiratory distress. She has no wheezes. She has no rales. She exhibits no tenderness.  Abdominal: Soft. Bowel sounds are normal. She exhibits no distension and no mass. There is no tenderness. There is no rebound and no guarding.  Musculoskeletal: Normal range of motion. She exhibits no edema or tenderness.  Lymphadenopathy:    She has no cervical adenopathy.  Neurological: She is alert and oriented to person, place, and time. She has normal reflexes. No cranial nerve deficit. She exhibits normal muscle tone. Coordination normal.  Skin: Skin is warm and dry. Rash noted. She is not diaphoretic. No erythema. No pallor.  New rash in back - going to see derm  Psychiatric: She has a normal mood and affect. Her behavior is normal. Judgment and thought content normal.  Vitals reviewed.    Filed Vitals:   12/04/14 1048  BP: 128/70  Pulse: 82  SpO2: 100%        Assessment & Plan:

## 2014-12-04 NOTE — Patient Instructions (Signed)
Finish the Z pak  Let us know if there are more problems before you see Dr Chase Caller on March 18

## 2014-12-07 ENCOUNTER — Ambulatory Visit (INDEPENDENT_AMBULATORY_CARE_PROVIDER_SITE_OTHER): Payer: Medicare Other | Admitting: Gynecology

## 2014-12-07 ENCOUNTER — Encounter: Payer: Self-pay | Admitting: Gynecology

## 2014-12-07 VITALS — BP 120/70 | Ht 64.0 in | Wt 162.0 lb

## 2014-12-07 DIAGNOSIS — N952 Postmenopausal atrophic vaginitis: Secondary | ICD-10-CM

## 2014-12-07 DIAGNOSIS — M8580 Other specified disorders of bone density and structure, unspecified site: Secondary | ICD-10-CM

## 2014-12-07 DIAGNOSIS — K469 Unspecified abdominal hernia without obstruction or gangrene: Secondary | ICD-10-CM

## 2014-12-07 DIAGNOSIS — Z01419 Encounter for gynecological examination (general) (routine) without abnormal findings: Secondary | ICD-10-CM

## 2014-12-07 NOTE — Progress Notes (Signed)
Cynthia Thornton [birth year] [id number]        69 y.o.  [id number] for breast and pelvic exam. Several issues noted below.  Past medical history,surgical history, problem list, medications, allergies, family history and social history were all reviewed and documented as reviewed in the EPIC chart.  ROS:  Performed with pertinent positives and negatives included in the history, assessment and plan.   Additional significant findings :  none   Exam: [name] Vitals:   12/07/14 1516  BP: 120/70  Height: 5' 4"  (1.626 m)  Weight: 162 lb (73.483 kg)   General appearance:  Normal affect, orientation and appearance. Skin: Grossly normal HEENT: Without gross lesions.  No cervical or supraclavicular adenopathy. Thyroid normal.  Lungs:  Clear without wheezing, rales or rhonchi Cardiac: RR, without RMG Abdominal:  Soft, nontender, without masses, guarding, rebound, organomegaly or hernia Breasts:  Examined lying and sitting without masses, retractions, discharge or axillary adenopathy. Pelvic:  Ext/BUS/vagina with generalized atrophic changes. High enterocele.  Adnexa  Without masses or tenderness    Anus and perineum  Normal   Rectovaginal  Normal sphincter tone without palpated masses or tenderness.    Assessment/Plan:  69 y.o. [id number] female for breast and pelvic exam.   1. Postmenopausal/atrophic genital changes. Status post [facility] 1991 for dysfunctional bleeding.  Patient without significant symptoms of hot flashes, night sweats, vaginal dryness. Continue to monitor.  2. Osteopenia.  DEXA 01/2014 T score -1.5. FRAX 8.9%/1%. Repeat at 2 year interval. Increase calcium and vitamin D reviewed. Patient will have vitamin D level checked when she has her next blood drawn at her primary physicians. 3. 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 Stable on exam. Patient asymptomatic without urinary or pressure symptoms. Continue to monitor. Check urinalysis. 4. Mammography 08/2014. Continue with annual  mammography.  Strong family history of breast cancer with sister age 48, maternal cousin age 52, great aunt unknown age, ovarian cancer age 67 and a maternal aunt. We previously discussed BRCA testing and she was planning to do so but did not follow up for this. I again reviewed her risks and what she would do with the information. Prophylactic surgeries versus more intensive surveillance such as adding pelvic ultrasound/MRIs of the breast. Patient does agree to discussing with genetic counselor and pursuing testing per their recommendation. We'll go ahead and help arrange through [facility] oncology and the patient knows the importance of follow up and to expect a phone call to help her do so. 5. Pap smear 2012. No Pap smear done today. No history of significant abnormal Pap smears. Status post hysterectomy for benign indications. Over the age 89. Per current screening guidelines we both feel comfortable stop screening 6. Health maintenance. No routine blood work done that she plans to do so at her primary physician's office. Follow up in one year, sooner as needed.     [name] MD, 3:42 PM 12/07/2014

## 2014-12-07 NOTE — Patient Instructions (Signed)
Office will call you to help arrange an appointment with the oncology genetic counselor.  You may obtain a copy of any labs that were done today by logging onto MyChart as outlined in the instructions provided with your AVS (after visit summary). The office will not call with normal lab results but certainly if there are any significant abnormalities then we will contact you.   Health Maintenance, Female A healthy lifestyle and preventative care can promote health and wellness.  Maintain regular health, dental, and eye exams.  Eat a healthy diet. Foods like vegetables, fruits, whole grains, low-fat dairy products, and lean protein foods contain the nutrients you need without too many calories. Decrease your intake of foods high in solid fats, added sugars, and salt. Get information about a proper diet from your caregiver, if necessary.  Regular physical exercise is one of the most important things you can do for your health. Most adults should get at least 150 minutes of moderate-intensity exercise (any activity that increases your heart rate and causes you to sweat) each week. In addition, most adults need muscle-strengthening exercises on 2 or more days a week.   Maintain a healthy weight. The body mass index (BMI) is a screening tool to identify possible weight problems. It provides an estimate of body fat based on height and weight. Your caregiver can help determine your BMI, and can help you achieve or maintain a healthy weight. For adults 20 years and older:  A BMI below 18.5 is considered underweight.  A BMI of 18.5 to 24.9 is normal.  A BMI of 25 to 29.9 is considered overweight.  A BMI of 30 and above is considered obese.  Maintain normal blood lipids and cholesterol by exercising and minimizing your intake of saturated fat. Eat a balanced diet with plenty of fruits and vegetables. Blood tests for lipids and cholesterol should begin at age 62 and be repeated every 5 years. If your  lipid or cholesterol levels are high, you are over 50, or you are a high risk for heart disease, you may need your cholesterol levels checked more frequently.Ongoing high lipid and cholesterol levels should be treated with medicines if diet and exercise are not effective.  If you smoke, find out from your caregiver how to quit. If you do not use tobacco, do not start.  Lung cancer screening is recommended for adults aged 54 80 years who are at high risk for developing lung cancer because of a history of smoking. Yearly low-dose computed tomography (CT) is recommended for people who have at least a 30-pack-year history of smoking and are a current smoker or have quit within the past 15 years. A pack year of smoking is smoking an average of 1 pack of cigarettes a day for 1 year (for example: 1 pack a day for 30 years or 2 packs a day for 15 years). Yearly screening should continue until the smoker has stopped smoking for at least 15 years. Yearly screening should also be stopped for people who develop a health problem that would prevent them from having lung cancer treatment.  If you are pregnant, do not drink alcohol. If you are breastfeeding, be very cautious about drinking alcohol. If you are not pregnant and choose to drink alcohol, do not exceed 1 drink per day. One drink is considered to be 12 ounces (355 mL) of beer, 5 ounces (148 mL) of wine, or 1.5 ounces (44 mL) of liquor.  Avoid use of street drugs. Do not  share needles with anyone. Ask for help if you need support or instructions about stopping the use of drugs.  High blood pressure causes heart disease and increases the risk of stroke. Blood pressure should be checked at least every 1 to 2 years. Ongoing high blood pressure should be treated with medicines, if weight loss and exercise are not effective.  If you are 12 to 67 years old, ask your caregiver if you should take aspirin to prevent strokes.  Diabetes screening involves taking a  blood sample to check your fasting blood sugar level. This should be done once every 3 years, after age 71, if you are within normal weight and without risk factors for diabetes. Testing should be considered at a younger age or be carried out more frequently if you are overweight and have at least 1 risk factor for diabetes.  Breast cancer screening is essential preventative care for women. You should practice "breast self-awareness." This means understanding the normal appearance and feel of your breasts and may include breast self-examination. Any changes detected, no matter how small, should be reported to a caregiver. Women in their 56s and 30s should have a clinical breast exam (CBE) by a caregiver as part of a regular health exam every 1 to 3 years. After age 72, women should have a CBE every year. Starting at age 36, women should consider having a mammogram (breast X-ray) every year. Women who have a family history of breast cancer should talk to their caregiver about genetic screening. Women at a high risk of breast cancer should talk to their caregiver about having an MRI and a mammogram every year.  Breast cancer gene (BRCA)-related cancer risk assessment is recommended for women who have family members with BRCA-related cancers. BRCA-related cancers include breast, ovarian, tubal, and peritoneal cancers. Having family members with these cancers may be associated with an increased risk for harmful changes (mutations) in the breast cancer genes BRCA1 and BRCA2. Results of the assessment will determine the need for genetic counseling and BRCA1 and BRCA2 testing.  The Pap test is a screening test for cervical cancer. Women should have a Pap test starting at age 53. Between ages 34 and 67, Pap tests should be repeated every 2 years. Beginning at age 60, you should have a Pap test every 3 years as long as the past 3 Pap tests have been normal. If you had a hysterectomy for a problem that was not cancer or  a condition that could lead to cancer, then you no longer need Pap tests. If you are between ages 76 and 19, and you have had normal Pap tests going back 10 years, you no longer need Pap tests. If you have had past treatment for cervical cancer or a condition that could lead to cancer, you need Pap tests and screening for cancer for at least 20 years after your treatment. If Pap tests have been discontinued, risk factors (such as a new sexual partner) need to be reassessed to determine if screening should be resumed. Some women have medical problems that increase the chance of getting cervical cancer. In these cases, your caregiver may recommend more frequent screening and Pap tests.  The human papillomavirus (HPV) test is an additional test that may be used for cervical cancer screening. The HPV test looks for the virus that can cause the cell changes on the cervix. The cells collected during the Pap test can be tested for HPV. The HPV test could be used to screen  women aged 52 years and older, and should be used in women of any age who have unclear Pap test results. After the age of 68, women should have HPV testing at the same frequency as a Pap test.  Colorectal cancer can be detected and often prevented. Most routine colorectal cancer screening begins at the age of 16 and continues through age 34. However, your caregiver may recommend screening at an earlier age if you have risk factors for colon cancer. On a yearly basis, your caregiver may provide home test kits to check for hidden blood in the stool. Use of a small camera at the end of a tube, to directly examine the colon (sigmoidoscopy or colonoscopy), can detect the earliest forms of colorectal cancer. Talk to your caregiver about this at age 82, when routine screening begins. Direct examination of the colon should be repeated every 5 to 10 years through age 20, unless early forms of pre-cancerous polyps or small growths are found.  Hepatitis C  blood testing is recommended for all people born from [birth year] through 1963 and any individual with known risks for hepatitis C.  Practice safe sex. Use condoms and avoid high-risk sexual practices to reduce the spread of sexually transmitted infections (STIs). Sexually active women aged 62 and younger should be checked for Chlamydia, which is a common sexually transmitted infection. Older women with new or multiple partners should also be tested for Chlamydia. Testing for other STIs is recommended if you are sexually active and at increased risk.  Osteoporosis is a disease in which the bones lose minerals and strength with aging. This can result in serious bone fractures. The risk of osteoporosis can be identified using a bone density scan. Women ages 46 and over and women at risk for fractures or osteoporosis should discuss screening with their caregivers. Ask your caregiver whether you should be taking a calcium supplement or vitamin D to reduce the rate of osteoporosis.  Menopause can be associated with physical symptoms and risks. Hormone replacement therapy is available to decrease symptoms and risks. You should talk to your caregiver about whether hormone replacement therapy is right for you.  Use sunscreen. Apply sunscreen liberally and repeatedly throughout the day. You should seek shade when your shadow is shorter than you. Protect yourself by wearing long sleeves, pants, a wide-brimmed hat, and sunglasses year round, whenever you are outdoors.  Notify your caregiver of new moles or changes in moles, especially if there is a change in shape or color. Also notify your caregiver if a mole is larger than the size of a pencil eraser.  Stay current with your immunizations. Document Released: 05/01/2011 Document Revised: 02/10/2013 Document Reviewed: 05/01/2011 [facility] Patient Information 2014 [place].

## 2014-12-10 ENCOUNTER — Other Ambulatory Visit (INDEPENDENT_AMBULATORY_CARE_PROVIDER_SITE_OTHER): Payer: Medicare Other

## 2014-12-10 DIAGNOSIS — R932 Abnormal findings on diagnostic imaging of liver and biliary tract: Secondary | ICD-10-CM

## 2014-12-10 LAB — BASIC METABOLIC PANEL
BUN: 18 mg/dL (ref 6–23)
CALCIUM: 9.7 mg/dL (ref 8.4–10.5)
CHLORIDE: 102 meq/L (ref 96–112)
CO2: 29 mEq/L (ref 19–32)
CREATININE: 0.89 mg/dL (ref 0.40–1.20)
GFR: 67.3 mL/min (ref >60.00)
Glucose, Bld: 86 mg/dL (ref 70–99)
Potassium: 4.6 mEq/L (ref 3.5–5.1)
Sodium: 136 mEq/L (ref 135–145)

## 2014-12-14 ENCOUNTER — Ambulatory Visit (HOSPITAL_COMMUNITY)
Admission: RE | Admit: 2014-12-14 | Discharge: 2014-12-14 | Disposition: A | Discharge status: Home / Self Care | Payer: Medicare Other | Source: Ambulatory Visit | Attending: Gastroenterology | Admitting: Gastroenterology

## 2014-12-14 ENCOUNTER — Other Ambulatory Visit: Payer: Self-pay | Admitting: Gastroenterology

## 2014-12-14 ENCOUNTER — Other Ambulatory Visit: Payer: Self-pay

## 2014-12-14 ENCOUNTER — Telehealth: Payer: Self-pay | Admitting: Internal Medicine

## 2014-12-14 DIAGNOSIS — R932 Abnormal findings on diagnostic imaging of liver and biliary tract: Secondary | ICD-10-CM

## 2014-12-14 DIAGNOSIS — K7689 Other specified diseases of liver: Secondary | ICD-10-CM

## 2014-12-14 NOTE — Telephone Encounter (Signed)
Pt states she could not do the mri this am, states she had a panic attack and was told she needs an open MRI and a pill to help her relax. Orders entered but [facility] closed today. Will call in am to schedule.

## 2014-12-16 MED ORDER — LORAZEPAM 1 MG PO TABS: ORAL_TABLET | ORAL | Status: DC

## 2014-12-16 NOTE — Telephone Encounter (Signed)
Left message for pt to call back  °

## 2014-12-16 NOTE — Telephone Encounter (Signed)
Order placed in epic for open MRI. [facility] to schedule open MRI but was told pt needs to call and schedule the MRI. Pt given the number [phone] to call. Pt aware.  Pt needs a pill to help her relax prior to MRI. Dr. Hilarie Fredrickson please advise.

## 2014-12-16 NOTE — Telephone Encounter (Signed)
Spoke with pt and she is scheduled for Open MRI 01/05/15. Script sent to pharmacy for pt and she is aware.

## 2014-12-16 NOTE — Telephone Encounter (Signed)
Ativan 1 mg PO 30 min to 1 hour before open MRI

## 2015-01-05 ENCOUNTER — Ambulatory Visit
Admission: RE | Admit: 2015-01-05 | Discharge: 2015-01-05 | Disposition: A | Discharge status: Home / Self Care | Payer: Medicare Other | Source: Ambulatory Visit | Attending: Gastroenterology | Admitting: Gastroenterology

## 2015-01-05 DIAGNOSIS — K7689 Other specified diseases of liver: Secondary | ICD-10-CM

## 2015-01-05 MED ORDER — GADOBENATE DIMEGLUMINE 529 MG/ML IV SOLN
15.0000 mL | Freq: Once | INTRAVENOUS | Status: AC | PRN
Start: 2015-01-05 — End: 2015-01-05
  Administered 2015-01-05: 15 mL via INTRAVENOUS

## 2015-01-08 ENCOUNTER — Telehealth: Payer: Self-pay | Admitting: Gastroenterology

## 2015-01-08 NOTE — Telephone Encounter (Signed)
Patient notified of results and recommendations.

## 2015-01-08 NOTE — Telephone Encounter (Signed)
Patient is asking for MRI results. Please, advise 

## 2015-01-08 NOTE — Telephone Encounter (Signed)
I had been waiting for Dr. Hilarie Fredrickson to respond to my message about her results so that is why she had not heard from [place].  Everything on the MRI is stable and appears benign.  He does want to repeat an MRI in 6 months with liver protocol to make sure that the large cyst is still stable.  Also, how is her upper/left upper abdominal pain doing that she had initially seen me for?  Thank you,  Cynthia Thornton

## 2015-01-15 ENCOUNTER — Encounter: Payer: Self-pay | Admitting: Internal Medicine

## 2015-01-15 ENCOUNTER — Ambulatory Visit (INDEPENDENT_AMBULATORY_CARE_PROVIDER_SITE_OTHER): Payer: Medicare Other | Admitting: Internal Medicine

## 2015-01-15 VITALS — BP 112/78 | HR 77 | Ht 64.5 in | Wt 161.0 lb

## 2015-01-15 DIAGNOSIS — J479 Bronchiectasis, uncomplicated: Secondary | ICD-10-CM | POA: Diagnosis not present

## 2015-01-15 DIAGNOSIS — Z87891 Personal history of nicotine dependence: Secondary | ICD-10-CM

## 2015-01-15 DIAGNOSIS — R042 Hemoptysis: Secondary | ICD-10-CM

## 2015-01-15 DIAGNOSIS — Z720 Tobacco use: Secondary | ICD-10-CM | POA: Diagnosis not present

## 2015-01-15 HISTORY — DX: Personal history of nicotine dependence: Z87.891

## 2015-01-15 NOTE — Progress Notes (Signed)
Subjective:    Patient ID: Cynthia Thornton, female    DOB: [birth year], 69 y.o.   MRN: [id number]  HPI    se Larose Kells, MD    reports that she quit [phone] - Her smoking use included Cigarettes. She has a 2 pack-year smoking history. She has never used smokeless tobacco.   IOV 07/26/2012   69 year old female. 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  Reports chronic cough x 7 months ([facility] 2013). Insidious onset. Does not remember preceding URI prior to onset of cough.   There is always associated hoarseness of voice,  clearing of throat, mucus in throat that is small in amount and she has difficulty getting it out (is creamy in color) which is all progressive.  No associated tickle in throat or sinus drainage. Talking makes symptoms worse. Says giving hx to me in office makes her voice hoarse. End of day makes her worse due to talking. SHe is frustrated that because talking is her job and talking make symptoms worse and she is unable to execute her job.   GERD hx  Has had GERD since 2003 and on nexium since. In July 2013 took medical help for cough and referred to  ENT eval: per her hx they told her it was GERD related cough but shey says that GERD symptoms are different from current cough. In terms of diet: drink tea, drinks diet pepsi 2-4 cans per day, but avoids pizza, fried food. Will have cheese on salad. Has only occasional chocolate. Eats bran cereal a day. Lunch is fruit and soup.   SINUS hx Denies post nasal sinus drainage. Denies tickle down throat ENT eval per hx in July 2013: normal sinuses. Trial of flonase July-August 2013: tried just one squirt and she did not like the sensation. So never used it.   ACE INHIBITOR Denies associated ACE inhibitor intake   SMOKING  Hx  1-3 cigs per day. Quit 16 years ago at age 34. Started smoking age 25.   FAm Hx  - mom died of lung cancer and had cough. Patient very concerned that cough is due to lung  cancer. I believe she feels guilty she might have missed her mom's cancer when her mom complained of chronic cough. She prefers upfront CT chest   CXR 05/30/12 1. No acute cardiopulmonary disease.  2. Hyperexpanded lungs with minimal linear heterogeneous opacities  within the right middle lobe and lingula, nonspecific possibly  atelectasis or scar, though chronic atypical infection (MAI) may  have a similar appearance.  Original Report Authenticated By: Rachel Moulds, M.D.  REC Please have CT chest without contrast  Please have breathing test called PFT  Return to see me after above     OV 10/11/2012 Fu cough  -Since last visit, without interventions , cough has improved 40%. She says drier weather was the reason for improvement. Says, more than cough, clearing of throat is the problem. REsults below of CT and PFT reviewed with patient. AT this stage, she feels she wants expectant Rx only. Not interested in further testing or intervention   PFT - normal   IMPRESSION:  CT chest 08/02/12 1. No acute cardiopulmonary abnormalities.  2. Lingular bronchiectasis  3. There are two clustered areas of nodularity within the  periphery of both lungs. These do not have worrisome features and  likely the sequela of atypical infection.  4. Liver and splenic hypodensities were present on the exam  from  10/07/2004 and likely reflect a combination of benign liver cyst  and hemangiomas.  Original Report Authenticated By: Angelita Ingles, M.D.  Glad you are much better Continue to control acid reflux You can even try voice rest for few days if cough returns As discussed and agreed, we are holding off further testing or treatment interventions Call [phone] and return if cough becomes a problem   09/24/2013 f/u ov/Wert re:  Chief Complaint  Patient presents with  . Acute Visit    Pt c/o hemoptysis x 2 days- She states that this is the first time she has had this problem. No change in her  breathing or other co's.    states she was not aware of dx of bronchiectasis or implications re risk of hemoptysis. Sputum did not really turn purulent before blood but the total amt is < tsp, no assoc epistaxis or sinus complaints     CTG sinus 09/30/13:  Mild mucosal thickening base of left maxillary sinus. No air-fluid  level.  REC Nexium + pepcid    OV 10/10/2013 ov/ Kron [place]   Chief Complaint  Patient presents with  . Cough    follow-up.  Pt states she has not coughed up any more blood. She states cough is improved, but she does c/o having a "stuffy nose" all day.   Followup chronic cough; multifactorial in the setting of lingular bronchiectasis  Cough never really went away. I am seeing her after a year. A month ago she did see Dr. Legrand Como wert having acute onset of hemoptysis one time. Chest x-ray was unremarkable and CT scan of the sinus showed mild sinus thickening but otherwise unremarkable. It was felt that the hemoptysis was from her lingular bronchiectasis. However, she did not have any infection at that time. Currently cough persist. She points to her throat. She clears her throat all the time. Cough is laryngeal and quality. Mostly dry but occasionally she does bring mucus. She is compliant with her antihistamine and Nexium and Pepcid. RSI cough score is 10  This  Time she is open to Rx and getting rid of cough.   rec Cough is from possible sinus drainage/allergies, possible acid reflux, possible  asthma,  All of this is working together to cause cyclical cough/LPR cough  #Sinus drainage  - start  Hypertonic saline nasal spray -  take generic fluticasone inhaler 2 squirts each nostril daily - continue anti histamime  # Acid Reflux  -continue nexium and pepcid - avoid colas, spices, cheeses, spirits, red meats, beer, chocolates, fried foods etc.,   - sleep with head end of bed elevated  - eat small frequent meals  - do not go to bed for 3 hours after last  meal  # Poissible Asthma   - do methacholine challenge test  #Vocal Cord Dysfunction or Irritable Larynx  - will refer you to speech therapy with Mr Garald Balding -  please choose 3 days and observe complete voice rest - no talking or whispering  - at all times there  there is urge to cough, drink water or swallow or sip on throat lozenge  - start neurontin 300mg  po daily  X 3 days, then 300mg  po twice daily x 3 days, then 300mg  po three times daily to continue. If this makes you sleepy, cut down on dose but you should continue neurontin at all times  #coughing up blood  - keep any eye on this, if recus wil  Need bronchoscopy  #  Health maintenance  - flu shot and prevnar    02/27/2014 f/u ov/Wert re: bronchiectasis/ chronic cough  Janice Coffin took Transport planner Complaint  Patient presents with  . Acute Visit    Pt states had hemoptysis yesterday while on her way home from work. She went to ED and had cxr and was started on zithromax. She c/o chest tightness that she relates to anxiety since this did not start until after hemoptysis.   not a tablespoon total of blood and no antecedent purulent sputum or epistxis/cp or sob zpak started one day  prior to OV     No obvious day to day or daytime variabilty or assoc   subjective wheeze overt sinus or hb symptoms. No unusual exp hx or h/o childhood pna/ asthma or knowledge of premature birth.  Sleeping ok without nocturnal  or early am exacerbation  of respiratory  c/o's or need for noct saba. Also denies any obvious fluctuation of symptoms with weather or environmental changes or other aggravating or alleviating factors except as outlined above  Cough  REC Please schedule a follow up visit in 3 months but call sooner if needed with DR Chase Caller to discuss longterm management of your problem  OV 12/04/2014  Chief Complaint  Patient presents with  . Hemoptysis    ACUTE VISIT: MR patient; coughing up blood; CXR done today and printe for  review. Denies any wheezing or SOB         Followup chronic cough. Ms. Vianna Venezia tells me that she no longer has chronic cough. However, in November 2014 she had a new onset of hemoptysis out of the blue. She showed me pictures it looks like she had flecks of hemoptysis. There is no associated infectious symptoms at that time. Episodes that'll spontaneously. Then again the same thing happened in May 2015. There was no associated fever, chest pain, syncope, wheezing, cough, yellow sputum. In between these episodes she has remained largely free of chronic cough except for some occasional mucus every few weeks./ D/w radiologist Dr Weber Cooks : based on prior imaging odds of pulmonary AVM low and hemoptuysis likely from bronchiectasis.  - A CT scan of the chest was in October 2013 that showed mild amount of lingular bronchiectasis   smoking:  reports that she quit smoking about 19 years ago. Her smoking use included Cigarettes. She has a 2 pack-year smoking history. She has never used smokeless tobacco.  12/04/14- Acute OV ACUTE VISIT: MR patient, former smoker, followed for bronchiectasis; coughing up blood; CXR done today and printed for review. Denies any wheezing or SOB  Last night she was sitting in a recliner after dinner, watching TV quietly when she began coughing up blood. Little cough or activity beforehand. More blood this time than she has seen before-scared her. She spoke to on-call doctor who sent Z-Pak. Bleeding almost stopped by this morning. No admixed purulent mucus, chest pain, unusual bruising or other bleeding, or fever. Previous episodes of hemoptysis one year ago and in April and September 2015, all self limited she has not been on anticoagulants/aspirin but uses occasional NSAID. CXR 12/04/14-images reviewed with her FINDINGS: The lungs are mildly hyperinflated. There is no focal infiltrate. There is no pulmonary parenchymal mass nor abnormal nodule. There is no pleural effusion. The  heart and pulmonary vascularity are normal. There is mild dextro curvature of the thoracolumbar spine which is stable. IMPRESSION: COPD. There is no acute cardiopulmonary abnormality. If the patient's hemoptysis persists, chest CT scanning is  recommended. Electronically Signed  By: David Martinique  On: 12/04/2014 10:14    OV 01/15/2015  Chief Complaint  Patient presents with  . Follow-up    Pt had an episode of hemoptysis and say CY on 12/04/2014. After pt completed Zpak it resolved. Pt denies recent SOB, cough and CP/tightness.     Follow-up hemoptysis  I used to see this patient for chronic cough that was idiopathic. But this is no longer a problem. Recently she's been suffering from intermittent hemoptysis. Most recently she saw my colleague Dr. Annamaria Boots in February 2016 for hemoptysis. Chest x-ray was fairly clear. A 2013 CT scan of the chest suggested that a lingular bronchiectasis was possibly the source of her bronchial hemoptysis. In any event she got treated with Z-Pak and hemoptysis resolved. The episode was fairly significant that she is frightened about it and she wants extensive workup currently she is asymptomatic though. She wants CT scan of the chest and bronchoscopy    has a past medical history of Osteopenia (01/2014); DUB (dysfunctional uterine bleeding); Arthritis; Hiatal hernia (08/15/2006); Esophageal reflux (08/15/2006); Hyperthyroidism; Esophageal stricture (08/15/2006); Personal history of colonic polyps (09/06/2001); and Pityriasis rosea.   reports that she quit smoking about 19 years ago. Her smoking use included Cigarettes. She has a 2 pack-year smoking history. She has never used smokeless tobacco.  Past Surgical History  Procedure Laterality Date  . Tonsillectomy    . Breast surgery  2000    Breast cyst removed, left  . Vaginal hysterectomy  1991    Right ovarian cystectomy    Allergies  Allergen Reactions  . Codeine Nausea Only  . Propranolol Hcl  Other (See Comments)    dizziness    Immunization History  Administered Date(s) Administered  . Influenza Split 08/31/2011, 07/30/2014  . Influenza Whole 08/30/2009, 07/30/2010  . Influenza,inj,Quad PF,36+ Mos 09/30/2012, 10/10/2013  . Pneumococcal Conjugate-13 10/10/2013  . Td 12/02/2008  . Zoster 12/02/2008    Family History  Problem Relation Age of Onset  . Osteoporosis Mother   . Lung cancer Mother   . Stroke Mother   . Diabetes Father   . Hypertension Father   . Osteoporosis Father   . Colon cancer Father 90  . Breast cancer Sister     Age 39  . Ovarian cancer Maternal Aunt 60  . Breast cancer Cousin     maternal-Age 29  . Breast cancer Maternal Aunt     great aunt- Age unknown  . Colon cancer Paternal Uncle 69  . Colon cancer Cousin     maternal  . Crohn's disease Son      Current outpatient prescriptions:  .  Cetirizine HCl (ZYRTEC PO), Take by mouth., Disp: , Rfl:  .  cholecalciferol (VITAMIN D) 1000 UNITS tablet, Take 1,000 Units by mouth daily., Disp: , Rfl:  .  clorazepate (TRANXENE) 7.5 MG tablet, Take 1 tablet by mouth as needed., Disp: , Rfl:  .  omeprazole (PRILOSEC) 40 MG capsule, Take 1 capsule (40 mg total) by mouth daily., Disp: 90 capsule, Rfl: 3 .  famotidine (PEPCID) 20 MG tablet, One at bedtime (Patient not taking: Reported on 01/15/2015), Disp: 30 tablet, Rfl: 11    Review of Systems  Constitutional: Negative for fever and unexpected weight change.  HENT: Negative for congestion, dental problem, ear pain, nosebleeds, postnasal drip, rhinorrhea, sinus pressure, sneezing, sore throat and trouble swallowing.   Eyes: Negative for redness and itching.  Respiratory: Negative for cough, chest tightness, shortness of breath and  wheezing.   Cardiovascular: Negative for palpitations and leg swelling.  Gastrointestinal: Negative for nausea and vomiting.  Genitourinary: Negative for dysuria.  Musculoskeletal: Negative for joint swelling.  Skin:  Negative for rash.  Neurological: Negative for headaches.  Hematological: Does not bruise/bleed easily.  Psychiatric/Behavioral: Negative for dysphoric mood. The patient is not nervous/anxious.        Objective:   Physical Exam  Constitutional: She is oriented to person, place, and time. She appears well-developed and well-nourished. No distress.  HENT:  Head: Normocephalic and atraumatic.  Right Ear: External ear normal.  Left Ear: External ear normal.  Mouth/Throat: Oropharynx is clear and moist. No oropharyngeal exudate.  Eyes: Conjunctivae and EOM are normal. Pupils are equal, round, and reactive to light. Right eye exhibits no discharge. Left eye exhibits no discharge. No scleral icterus.  Neck: Normal range of motion. Neck supple. No JVD present. No tracheal deviation present. No thyromegaly present.  Cardiovascular: Normal rate, regular rhythm, normal heart sounds and intact distal pulses.  Exam reveals no gallop and no friction rub.   No murmur heard. Pulmonary/Chest: Effort normal and breath sounds normal. No respiratory distress. She has no wheezes. She has no rales. She exhibits no tenderness.  Abdominal: Soft. Bowel sounds are normal. She exhibits no distension and no mass. There is no tenderness. There is no rebound and no guarding.  Musculoskeletal: Normal range of motion. She exhibits no edema or tenderness.  Lymphadenopathy:    She has no cervical adenopathy.  Neurological: She is alert and oriented to person, place, and time. She has normal reflexes. No cranial nerve deficit. She exhibits normal muscle tone. Coordination normal.  Skin: Skin is warm and dry. No rash noted. She is not diaphoretic. No erythema. No pallor.  Psychiatric: She has a normal mood and affect. Her behavior is normal. Judgment and thought content normal.  Vitals reviewed.   Filed Vitals:   01/15/15 1530  BP: 112/78  Pulse: 77  Height: 5' 4.5" (1.638 m)  Weight: 161 lb (73.029 kg)  SpO2: 98%          Assessment & Plan:     ICD-9-CM ICD-10-CM   1. Hemoptysis 786.30 R04.2 CT Chest High Resolution  2. Bronchiectasis without complication 564.3 P29.5 CT Chest High Resolution  3. Smoking history V15.82 Z72.0   having intermittent hemoptysuis . Last cT was 2013. She is worried about intrinsic lung path. She wants full workup. So,   pLAN Do HRCT chest - will call with results Depending on results  Will fix bronchoscopy +/- biopsy   Followup  - depending on above  Dr. Brand Males, M.D., [facility]C.P Pulmonary and Critical Care Medicine Staff Physician [place] [facility] Pager: [phone], If no answer or between  15:00h - 7:00h: call [phone]  01/22/2015 3:39 AM

## 2015-01-15 NOTE — Patient Instructions (Signed)
ICD-9-CM ICD-10-CM   1. Hemoptysis 786.30 R04.2   2. Bronchiectasis without complication 883.3 V44.5   3. Smoking history V15.82 Z72.0    Do HRCT chest - will call with results Depending on results  Will fix bronchoscopy +/- biopsy   Followup  - depending on above

## 2015-01-25 ENCOUNTER — Ambulatory Visit (INDEPENDENT_AMBULATORY_CARE_PROVIDER_SITE_OTHER)
Admission: RE | Admit: 2015-01-25 | Discharge: 2015-01-25 | Disposition: A | Discharge status: Home / Self Care | Payer: Medicare Other | Source: Ambulatory Visit | Attending: Internal Medicine | Admitting: Internal Medicine

## 2015-01-25 DIAGNOSIS — R042 Hemoptysis: Secondary | ICD-10-CM | POA: Diagnosis not present

## 2015-01-25 DIAGNOSIS — J479 Bronchiectasis, uncomplicated: Secondary | ICD-10-CM

## 2015-01-27 ENCOUNTER — Telehealth: Payer: Self-pay | Admitting: Internal Medicine

## 2015-01-27 NOTE — Telephone Encounter (Signed)
Let Cynthia Thornton know that CT chest is free of cancer or spots or nodule or scarring. Only thing is mild RML bronchiectasis and can be source of bleeding. Given repeaed hemoptysis, rec to do flex video bronch with BAL. She is aware this might be the rec. I can do next week at [facility] . Pleas ask her what morning next week are best for her  Dr. Brand Males, M.D., [facility].C.P Pulmonary and Critical Care Medicine Staff Physician [place] [facility] Pager: [phone], If no answer or between  15:00h - 7:00h: call [phone]  01/27/2015 10:02 PM

## 2015-01-28 NOTE — Telephone Encounter (Signed)
Called and spoke to pt. Informed pt of the results and recs per MR. Pt stated she can do any day next week but would like to avoid Wednesday morning if possible, pt stated if needed she can do Wednesday if necessary.   MR please advise on day next week for bronch, with fluoro?

## 2015-01-28 NOTE — Telephone Encounter (Addendum)
Monday 02/01/15 - anytime after 8.30am [place]  - preferred All other days except Friday - 7.30am at [place] scope, no tb risk, bal only, no fluoro    Dr. Brand Males, M.D., [facility].C.P Pulmonary and Critical Care Medicine Staff Physician [place] [facility] Pager: [phone], If no answer or between  15:00h - 7:00h: call [phone]  01/28/2015 1:43 PM

## 2015-01-29 NOTE — Telephone Encounter (Signed)
Pt is aware of bronch date, time and location. Nothing further was needed.

## 2015-01-29 NOTE — Telephone Encounter (Signed)
Called and spoke to Buena at [facility]. Bronch without fluoro is scheduled for 02/01/15 at [facility] at 1000 pt will need to arrive by 845.    lmtcb for pt.

## 2015-01-29 NOTE — Telephone Encounter (Signed)
Pt returned call Call back at [phone]

## 2015-02-01 ENCOUNTER — Telehealth: Payer: Self-pay | Admitting: Internal Medicine

## 2015-02-01 ENCOUNTER — Ambulatory Visit (HOSPITAL_COMMUNITY): Admission: RE | Admit: 2015-02-01 | Payer: Medicare Other | Source: Ambulatory Visit | Admitting: Internal Medicine

## 2015-02-01 ENCOUNTER — Encounter (HOSPITAL_COMMUNITY): Admission: RE | Payer: Self-pay | Source: Ambulatory Visit

## 2015-02-01 ENCOUNTER — Inpatient Hospital Stay (HOSPITAL_COMMUNITY): Admission: RE | Admit: 2015-02-01 | Payer: Medicare Other | Source: Ambulatory Visit

## 2015-02-01 SURGERY — VIDEO BRONCHOSCOPY WITHOUT FLUORO
Anesthesia: Moderate Sedation | Laterality: Bilateral

## 2015-02-01 MED ORDER — AZITHROMYCIN 250 MG PO TABS: ORAL_TABLET | ORAL | Status: DC

## 2015-02-01 NOTE — Telephone Encounter (Signed)
Patient canceled bronch 02/01/2015 due to laryngitis. Please have her call back in 2 weeks to schedule bronch

## 2015-02-01 NOTE — Telephone Encounter (Signed)
Spoke with pt.  Discussed below recs per MR.  She verbalized understanding and is aware rx sent to [place].  She is to call back if symptoms do not improve or worsen.

## 2015-02-01 NOTE — Telephone Encounter (Signed)
She can try some Z PAK

## 2015-02-01 NOTE — Telephone Encounter (Signed)
See duplicate phone msg from today regarding this for additional information.

## 2015-02-01 NOTE — Telephone Encounter (Signed)
Spoke with pt.  She will call back to schedule bronch in 2 wks d/t laryngitis per 01/27/15 phone recs by MR.    Pt c/o cough with tan mucus and some PND along with laryngitis.  Reports this "came on suddenly" late yesterday.  Denies chest tightness, CP, f/c/s, or increased SOB.  Is taking zyrtec qd. Pt reports she's been instructed to call asap if symptoms started d/t hx of hemoptysis for abx.  Would like to know if MR believes this is needed and for further recs.  Please advise.  Thank you.  [place]

## 2015-02-01 NOTE — Telephone Encounter (Signed)
Spoke with pt - she is aware to call back in 2 wks to schedule bronch.  She verbalized understanding and is in agreement with this plan.

## 2015-02-15 ENCOUNTER — Telehealth: Payer: Self-pay | Admitting: Internal Medicine

## 2015-02-15 NOTE — Telephone Encounter (Signed)
Pt was to have a bronch done. It was canceled due to her being sick.  MR - please advise when we can reschedule this. Thanks.

## 2015-02-18 NOTE — Telephone Encounter (Signed)
What days next week - last week of April 2016 is she freee or first week of may 2016?

## 2015-02-18 NOTE — Telephone Encounter (Signed)
lmtcb x1 

## 2015-02-19 NOTE — Telephone Encounter (Signed)
Pt returned call - [phone]

## 2015-02-19 NOTE — Telephone Encounter (Signed)
Pt states that any day next week (last week of April) will work but April 29th is the best day.  Please advise. Thanks.

## 2015-02-24 NOTE — Telephone Encounter (Signed)
MR please advise. Thanks! 

## 2015-02-25 NOTE — Telephone Encounter (Signed)
Can we do may 3rd early morning 7.30am Tuesday at [place] [facility]? May 4th morning wed also a possibility at [place]  Dr. Brand Males, M.D., [facility].C.P Pulmonary and Critical Care Medicine Staff Physician [place] [facility] Pager: [phone], If no answer or between  15:00h - 7:00h: call [phone]  02/25/2015 10:29 AM

## 2015-02-25 NOTE — Telephone Encounter (Signed)
Called and spoke to pt. Pt stated she cannot do the week of May 2nd d/t prior obligations with work. Pt stated any day after the first week of May and May 10th then she is able to do any other day.   MR please advise.

## 2015-03-02 NOTE — Telephone Encounter (Signed)
MR please advise on what day would work for you.  Thanks!

## 2015-03-08 NOTE — Telephone Encounter (Signed)
Spoke with pt.  Tuesday, May 17 morning at [facility] will work for her. She is aware we will call to schedule tomorrow am and will call her back with specifics.  She verbalized understanding and is requesting we leave a detailed msg tomorrow on VM if she does not answer. Will route to [place] to schedule in AM.  Thank you.

## 2015-03-08 NOTE — Telephone Encounter (Signed)
May 17th tuessday morning at [facility] hospital. I can do

## 2015-03-09 NOTE — Telephone Encounter (Signed)
Pt returned call [phone]

## 2015-03-09 NOTE — Telephone Encounter (Signed)
Called and spoke to scheduler at [facility]. Bronch scheduled for 5/17 at [facility] at 0800, without fluoro. LMTCB for pt.

## 2015-03-09 NOTE — Telephone Encounter (Signed)
Patient notified of bronchoscopy scheduled.  Nothing further needed.

## 2015-03-16 ENCOUNTER — Encounter (HOSPITAL_COMMUNITY)
Admission: RE | Disposition: A | Discharge status: Home / Self Care | Payer: Medicare Other | Source: Ambulatory Visit | Attending: Internal Medicine

## 2015-03-16 ENCOUNTER — Ambulatory Visit (HOSPITAL_COMMUNITY)
Admission: RE | Admit: 2015-03-16 | Discharge: 2015-03-16 | Disposition: A | Discharge status: Home / Self Care | Payer: Medicare Other | Source: Ambulatory Visit | Attending: Internal Medicine | Admitting: Internal Medicine

## 2015-03-16 ENCOUNTER — Encounter (HOSPITAL_COMMUNITY): Payer: Self-pay | Admitting: Internal Medicine

## 2015-03-16 DIAGNOSIS — Z87891 Personal history of nicotine dependence: Secondary | ICD-10-CM | POA: Insufficient documentation

## 2015-03-16 DIAGNOSIS — Z87898 Personal history of other specified conditions: Secondary | ICD-10-CM

## 2015-03-16 DIAGNOSIS — M1990 Unspecified osteoarthritis, unspecified site: Secondary | ICD-10-CM | POA: Insufficient documentation

## 2015-03-16 DIAGNOSIS — E0590 Thyrotoxicosis, unspecified without thyrotoxic crisis or storm: Secondary | ICD-10-CM | POA: Diagnosis not present

## 2015-03-16 DIAGNOSIS — Z8709 Personal history of other diseases of the respiratory system: Secondary | ICD-10-CM

## 2015-03-16 DIAGNOSIS — M8580 Other specified disorders of bone density and structure, unspecified site: Secondary | ICD-10-CM | POA: Diagnosis not present

## 2015-03-16 DIAGNOSIS — K219 Gastro-esophageal reflux disease without esophagitis: Secondary | ICD-10-CM | POA: Insufficient documentation

## 2015-03-16 DIAGNOSIS — J479 Bronchiectasis, uncomplicated: Secondary | ICD-10-CM | POA: Diagnosis not present

## 2015-03-16 DIAGNOSIS — R042 Hemoptysis: Secondary | ICD-10-CM | POA: Diagnosis present

## 2015-03-16 HISTORY — PX: VIDEO BRONCHOSCOPY: SHX5072

## 2015-03-16 LAB — BODY FLUID CELL COUNT WITH DIFFERENTIAL
LYMPHS FL: 23 %
MONOCYTE-MACROPHAGE-SEROUS FLUID: 45 % — AB (ref 50–90)
NEUTROPHIL FLUID: 32 % — AB (ref 0–25)
WBC FLUID: 212 uL (ref 0–1000)

## 2015-03-16 LAB — PNEUMOCYSTIS JIROVECI SMEAR BY DFA: Pneumocystis jiroveci Ag: NEGATIVE

## 2015-03-16 SURGERY — VIDEO BRONCHOSCOPY WITHOUT FLUORO
Anesthesia: Moderate Sedation | Laterality: Bilateral

## 2015-03-16 MED ORDER — PHENYLEPHRINE HCL 0.25 % NA SOLN
NASAL | Status: DC | PRN
  Administered 2015-03-16: 2 via NASAL

## 2015-03-16 MED ORDER — FENTANYL CITRATE (PF) 100 MCG/2ML IJ SOLN
INTRAMUSCULAR | Status: AC
  Filled 2015-03-16: qty 4

## 2015-03-16 MED ORDER — LIDOCAINE HCL 2 % EX GEL
CUTANEOUS | Status: DC | PRN
  Administered 2015-03-16: 1

## 2015-03-16 MED ORDER — FENTANYL CITRATE (PF) 100 MCG/2ML IJ SOLN
INTRAMUSCULAR | Status: DC | PRN
  Administered 2015-03-16: 50 ug via INTRAVENOUS

## 2015-03-16 MED ORDER — MIDAZOLAM HCL 10 MG/2ML IJ SOLN
INTRAMUSCULAR | Status: DC | PRN
  Administered 2015-03-16 (×2): 2 mg via INTRAVENOUS

## 2015-03-16 MED ORDER — PHENYLEPHRINE HCL 0.25 % NA SOLN: 1.0000 | Freq: Four times a day (QID) | NASAL | Status: DC | PRN

## 2015-03-16 MED ORDER — LIDOCAINE HCL (PF) 1 % IJ SOLN
INTRAMUSCULAR | Status: DC | PRN
  Administered 2015-03-16: 6 mL

## 2015-03-16 MED ORDER — SODIUM CHLORIDE 0.9 % IV SOLN
INTRAVENOUS | Status: DC
  Administered 2015-03-16: 07:00:00 via INTRAVENOUS

## 2015-03-16 MED ORDER — BUTAMBEN-TETRACAINE-BENZOCAINE 2-2-14 % EX AERO: 1.0000 | INHALATION_SPRAY | Freq: Once | CUTANEOUS | Status: DC

## 2015-03-16 MED ORDER — MIDAZOLAM HCL 5 MG/ML IJ SOLN
INTRAMUSCULAR | Status: AC
  Filled 2015-03-16: qty 2

## 2015-03-16 MED ORDER — LIDOCAINE HCL 2 % EX GEL: 1.0000 "application " | Freq: Once | CUTANEOUS | Status: DC

## 2015-03-16 NOTE — Discharge Instructions (Addendum)
Flexible Bronchoscopy, Care After These instructions give you information on caring for yourself after your procedure. Your doctor may also give you more specific instructions. Call your doctor if you have any problems or questions after your procedure. HOME CARE  Do not eat or drink anything for 2 hours after your procedure. If you try to eat or drink before the medicine wears off, food or drink could go into your lungs. You could also burn yourself.  After 2 hours have passed and when you can cough and gag normally, you may eat soft food and drink liquids slowly.  The day after the test, you may eat your normal diet.  You may do your normal activities.  Keep all doctor visits. GET HELP RIGHT AWAY IF:  You get more and more short of breath.  You get light-headed.  You feel like you are going to pass out (faint).  You have chest pain.  You have new problems that worry you.  You cough up more than a little blood.  You cough up more blood than before. MAKE SURE YOU:  Understand these instructions.  Will watch your condition.  Will get help right away if you are not doing well or get worse. Document Released: 08/13/2009 Document Revised: 10/21/2013 Document Reviewed: 06/20/2013 [facility] Patient Information 2015 [place], [place]. This information is not intended to replace advice given to you by your health care provider. Make sure you discuss any questions you have with your health care provider.    Nothing to eat or drink until after 10:30am On 03/16/2015   Future Appointments Date Time Provider [place]  04/23/2015 3:00 PM Tammy Jeralene Huff, NP [facility] None

## 2015-03-16 NOTE — Progress Notes (Signed)
Video Bronchoscopy performed.  Intervention bronchial washing  Patient tolerated procedure well, no complications noted.

## 2015-03-16 NOTE — Op Note (Signed)
Name:  [name] MRN:  [id number] DOB:  04-Jun-1946  PROCEDURE NOTE  Procedure(s): Flexible Video bronchoscopy [id number]) Bronchial alveolar lavage [phone]) of the RIGHT MIDDLE LOBE  Indications:  Hemoptysis - intermittent and chronic with Right Middle Lobe Bronchiectasis  Consent:  Procedure, benefits, risks and alternatives discussed.  Questions answered.  Consent obtained.  Anesthesia: Moderate SEdation  Procedure summary:  Appropriate equipment was assembled.  The patient was brought to the operating room and identified as Cynthia Thornton.  Safety timeout was performed. The patient was placed supine on the operating table, andmoderate sedation administered byMD   After the appropriate level of sedation was assured, flexible video bronchoscope was lubricated and inserted through the endotracheal tube.    Airway examination was performed bilaterally to subsegmental level.  Minimal clear secretions were noted, mucosa appeared normal and no endobronchial lesions were identified.  Bronchial alveolar lavage of the right middle lobe was performed with 120 mL of normal saline and return of 45 mL of fluid, after which the bronchoscope was withdrawn. It was clear  Post-procedure chest x-ray was NOT indicated.  Specimens sent: Bronchial alveolar lavage specimen of the RIGHT MIDDLE LOBE for cell count, microbiology (including mAI) and cytology.  Complications:  No immediate complications were noted.  Hemodynamic parameters and oxygenation remained stable throughout the procedure.  Estimated blood loss: NONE    Dr. Brand Males, M.D., [facility].C.P Pulmonary and Critical Care Medicine Staff Physician [place] [facility] Pager: [phone], If no answer or between  15:00h - 7:00h: call [phone]  03/16/2015 8:36 AM

## 2015-03-16 NOTE — H&P (Signed)
S: Cynthia H&P is > 37 days old and so redone. She has intermittent hemoptyusis. Cause unclear. She and I last met in March 2016 and discussed and mutually agreed that [place] with CT showing RML bronchiectasis. Since last OV 01/15/15 Cynthia further hemoptysis. She has canceled bronch in past due to scheduling issues. Currently feels well. Attests NPO. Cynthia chest pain etc.,   has a past medical history of Osteopenia (01/2014); DUB (dysfunctional uterine bleeding); Arthritis; Hiatal hernia (08/15/2006); Esophageal reflux (08/15/2006); Hyperthyroidism; Esophageal stricture (08/15/2006); Personal history of colonic polyps (09/06/2001); and Pityriasis rosea.   reports that she quit smoking about 19 years ago. Her smoking use included Cigarettes. She has a 2 pack-year smoking history. She has never used smokeless tobacco.  Past Surgical History  Procedure Laterality Date  . Tonsillectomy    . Breast surgery  2000    Breast cyst removed, left  . Vaginal hysterectomy  1991    Right ovarian cystectomy    Allergies  Allergen Reactions  . Codeine Nausea Only  . Propranolol Hcl Other (See Comments)    dizziness    Immunization History  Administered Date(s) Administered  . Influenza Split 08/31/2011, 07/30/2014  . Influenza Whole 08/30/2009, 07/30/2010  . Influenza,inj,Quad PF,36+ Mos 09/30/2012, 10/10/2013  . Pneumococcal Conjugate-13 10/10/2013  . Td 12/02/2008  . Zoster 12/02/2008    Family History  Problem Relation Age of Onset  . Osteoporosis Mother   . Lung cancer Mother   . Stroke Mother   . Diabetes Father   . Hypertension Father   . Osteoporosis Father   . Colon cancer Father 54  . Breast cancer Sister     Age 31  . Ovarian cancer Maternal Aunt 60  . Breast cancer Cousin     maternal-Age 65  . Breast cancer Maternal Aunt     great aunt- Age unknown  . Colon cancer Paternal Uncle 5  . Colon cancer Cousin     maternal  . Crohn's disease Son      Current  facility-administered medications:  .  0.9 %  sodium chloride infusion, , Intravenous, Continuous, Brand Males, MD, Last Rate: 10 mL/hr at 03/16/15 0726 .  butamben-tetracaine-benzocaine (CETACAINE) spray 1 spray, 1 spray, Topical, Once, Brand Males, MD .  lidocaine (PF) (XYLOCAINE) 1 % injection, , , PRN, Brand Males, MD, 6 mL at 03/16/15 0710 .  lidocaine (XYLOCAINE) 2 % jelly 1 application, 1 application, Topical, Once, Brand Males, MD .  lidocaine (XYLOCAINE) 2 % jelly, , , PRN, Brand Males, MD, 1 application at 67/12/45 0707 .  phenylephrine (NEO-SYNEPHRINE) 0.25 % nasal spray 1 spray, 1 spray, Each Nare, Q6H PRN, Brand Males, MD .  phenylephrine (NEO-SYNEPHRINE) 0.25 % nasal spray, , , PRN, Brand Males, MD, 2 spray at 03/16/15 0705    O Filed Vitals:   03/16/15 0740 03/16/15 0745 03/16/15 0750 03/16/15 0755  BP: 117/65 111/65 133/69 125/67  Pulse: 70 69 70 67  Temp:      TempSrc:      Resp: _0 Height:      Weight:      SpO2: 99% 99% 100% 100%    Exam Gen: mildly anxious Neuro: axox3, speech normal ResP: CTA bilaterally. Cynthia  Wheeze CVS: Normal heart sounds. Cynthia murmurs PA: Soft. Cynthia mass. Ext: Cynthia cyanosis. Cynthia clubbing. Cynthia edema Skin : intact exposed areas   LAbs CT 3/28'/16 - as reported and copied and pasted IMPRESSION: 1. Cynthia evidence of interstitial lung  disease. 2. 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   Electronically Signed  By: Lorin Picket M.D.  On: 01/25/2015 09:27        chemistries 2/11!6 - creat 0.84m% 09/09/14 - plat 323, hgb 12.6gm%   A Hemoptysis RML bronchietasis  P Risks of pneumothorax, hemothorax, sedation/anesthesia complications such as cardiac or respiratory arrest or hypotension, stroke and bleeding all explained. Benefits of diagnosis  but limitations of non-diagnosis also explained. Patient verbalized understanding and wished to proceed.   Flex video bronch with BAL +/- endobronchial biopsy  Dr. MBrand Males M.D., [facility]C.P Pulmonary and Critical Care Medicine Staff Physician [facility] Pager: [phone] If Cynthia answer or between  15:00h - 7:00h: call [phone]  03/16/2015 8:03 AM

## 2015-03-17 ENCOUNTER — Encounter (HOSPITAL_COMMUNITY): Payer: Self-pay | Admitting: Internal Medicine

## 2015-03-18 ENCOUNTER — Telehealth: Payer: Self-pay | Admitting: Internal Medicine

## 2015-03-18 LAB — CULTURE, BAL-QUANTITATIVE: SPECIAL REQUESTS: NORMAL

## 2015-03-18 LAB — CULTURE, BAL-QUANTITATIVE W GRAM STAIN

## 2015-03-18 NOTE — Telephone Encounter (Signed)
Let patient know that so far all bronch resuls normal  Dr. Brand Males, M.D., [facility].C.P Pulmonary and Critical Care Medicine Staff Physician [place] [facility] Pager: [phone], If no answer or between  15:00h - 7:00h: call [phone]  03/18/2015 7:37 PM

## 2015-03-19 NOTE — Telephone Encounter (Signed)
Called and spoke to pt. Informed pt of the results per MR. Pt verbalized understanding and denied any further questions or concerns at this time.  

## 2015-04-01 ENCOUNTER — Other Ambulatory Visit: Payer: Self-pay

## 2015-04-02 ENCOUNTER — Other Ambulatory Visit: Payer: Self-pay

## 2015-04-02 ENCOUNTER — Encounter: Payer: Self-pay | Admitting: Internal Medicine

## 2015-04-02 ENCOUNTER — Ambulatory Visit (INDEPENDENT_AMBULATORY_CARE_PROVIDER_SITE_OTHER): Payer: Medicare Other | Admitting: Internal Medicine

## 2015-04-02 VITALS — BP 126/78 | HR 99 | Temp 98.7°F | Ht 64.5 in | Wt 163.1 lb

## 2015-04-02 DIAGNOSIS — R21 Rash and other nonspecific skin eruption: Secondary | ICD-10-CM

## 2015-04-02 MED ORDER — PREDNISONE 10 MG PO TABS: ORAL_TABLET | ORAL | Status: DC

## 2015-04-02 MED ORDER — BETAMETHASONE DIPROPIONATE AUG 0.05 % EX CREA: TOPICAL_CREAM | Freq: Two times a day (BID) | CUTANEOUS | Status: DC

## 2015-04-02 NOTE — Patient Instructions (Signed)
Take the prednisone as prescribed  Use the cream twice a day until better  Please schedule a physical exam at your earliest convenience  Call if not improving at the end of the treatment

## 2015-04-02 NOTE — Progress Notes (Signed)
Subjective:    Patient ID: Cynthia Thornton, female    DOB: 1946/01/12, 69 y.o.   MRN: [id number]  DOS:  04/02/2015 Type of visit - description : acute Interval history:  2 weeks ago was doing yard work and developed a rash at the right arm, it is itching, not hurting, one area is quite red and she thinks she may have had an ant bite.  Review of Systems Denies fever chills No discharge from the arm. No nausea, vomiting, diarrhea No other rashes  Past Medical History  Diagnosis Date  . Osteopenia 01/2014    T score -1.5 FRAX 8.9%/1.0%  . DUB (dysfunctional uterine bleeding)   . Arthritis   . Hiatal hernia 08/15/2006    EGD  . Esophageal reflux 08/15/2006    EGD(Chronic)  . Hyperthyroidism   . Esophageal stricture 08/15/2006    EGD  . Personal history of colonic polyps 09/06/2001    hyperplastic  . Pityriasis rosea     Past Surgical History  Procedure Laterality Date  . Tonsillectomy    . Breast surgery  2000    Breast cyst removed, left  . Vaginal hysterectomy  1991    Right ovarian cystectomy  . Video bronchoscopy Bilateral 03/16/2015    Procedure: VIDEO BRONCHOSCOPY WITHOUT FLUORO;  Surgeon: Brand Males, MD;  Location[facility] ENDOSCOPY;  Service: Endoscopy;  Laterality: Bilateral;    History   Social History  . Marital Status: Divorced    Spouse Name: N/A  . Number of Children: 2  . Years of Education: N/A   Occupational History  . teacher [employer]  . retired Corporate investment banker    Social History Main Topics  . Smoking status: Former Smoker -- 0.20 packs/day for 10 years    Types: Cigarettes    Quit date: 10/31/1995  . Smokeless tobacco: Never Used  . Alcohol Use: 0.0 oz/week    0 Standard drinks or equivalent per week     Comment: occ  . Drug Use: No  . Sexual Activity: Not Currently    Birth Control/ Protection: Surgical     Comment: 1st intercourse 69 yo-Fewer than 5 partners   Other Topics Concern  . Not on file   Social History  Narrative        Medication List       This list is accurate as of: 04/02/15 11:59 PM.  Always use your most recent med list.               augmented betamethasone dipropionate 0.05 % cream  Commonly known as:  DIPROLENE-AF  Apply topically 2 (two) times daily.     cholecalciferol 1000 UNITS tablet  Commonly known as:  VITAMIN D  Take 1,000 Units by mouth daily.     clorazepate 7.5 MG tablet  Commonly known as:  TRANXENE  Take 1 tablet by mouth as needed.     famotidine 20 MG tablet  Commonly known as:  PEPCID  One at bedtime     omeprazole 40 MG capsule  Commonly known as:  PRILOSEC  Take 1 capsule (40 mg total) by mouth daily.     predniSONE 10 MG tablet  Commonly known as:  DELTASONE  3 tabs x 3 days, 2 tabs x 3 days, 1 tab x 3 days     ZYRTEC PO  Take by mouth.           Objective:   Physical Exam  Constitutional: She is oriented  to person, place, and time. She appears well-developed and well-nourished. No distress.  Musculoskeletal: She exhibits no edema.       Arms: Neurological: She is alert and oriented to person, place, and time.  Skin: She is not diaphoretic.  Psychiatric: She has a normal mood and affect. Her behavior is normal. Judgment and thought content normal.   BP 126/78 mmHg  Pulse 99  Temp(Src) 98.7 F (37.1 C) (Oral)  Ht 5' 4.5" (1.638 m)  Wt 163 lb 2 oz (73.993 kg)  BMI 27.58 kg/m2  SpO2 99%       Assessment & Plan:    Rash, Rash most likely due to contact dermatitis, insect bite is less likely. Plan: Continue Benadryl, betamethasone, prednisone.

## 2015-04-02 NOTE — Progress Notes (Signed)
Pre visit review using our clinic review tool, if applicable. No additional management support is needed unless otherwise documented below in the visit note. 

## 2015-04-06 ENCOUNTER — Telehealth: Payer: Self-pay | Admitting: Internal Medicine

## 2015-04-06 NOTE — Telephone Encounter (Signed)
Left message for pt to call back  °

## 2015-04-07 MED ORDER — ESOMEPRAZOLE MAGNESIUM 40 MG PO PACK: 40.0000 mg | PACK | Freq: Every day | ORAL | Status: DC

## 2015-04-07 MED ORDER — FAMOTIDINE 20 MG PO TABS: ORAL_TABLET | ORAL | Status: DC

## 2015-04-07 NOTE — Telephone Encounter (Signed)
Pt states the omeprazole is not working for her. States she is waking up at night with acid reflux and she is nauseated. Pt would like to go back on Nexium. Script sent to pharmacy along with refill for pepcid that she takes at night.

## 2015-04-14 LAB — FUNGUS CULTURE W SMEAR
FUNGAL SMEAR: NONE SEEN
SPECIAL REQUESTS: NORMAL

## 2015-04-23 ENCOUNTER — Encounter: Payer: Self-pay | Admitting: Adult Health

## 2015-04-23 ENCOUNTER — Ambulatory Visit (INDEPENDENT_AMBULATORY_CARE_PROVIDER_SITE_OTHER): Payer: Medicare Other | Admitting: Adult Health

## 2015-04-23 ENCOUNTER — Ambulatory Visit (INDEPENDENT_AMBULATORY_CARE_PROVIDER_SITE_OTHER)
Admission: RE | Admit: 2015-04-23 | Discharge: 2015-04-23 | Disposition: A | Discharge status: Home / Self Care | Payer: Medicare Other | Source: Ambulatory Visit | Attending: Adult Health | Admitting: Adult Health

## 2015-04-23 VITALS — BP 110/76 | HR 73 | Temp 97.7°F | Ht 64.0 in | Wt 159.0 lb

## 2015-04-23 DIAGNOSIS — R042 Hemoptysis: Secondary | ICD-10-CM

## 2015-04-23 DIAGNOSIS — J471 Bronchiectasis with (acute) exacerbation: Secondary | ICD-10-CM

## 2015-04-23 DIAGNOSIS — R05 Cough: Secondary | ICD-10-CM

## 2015-04-23 DIAGNOSIS — R059 Cough, unspecified: Secondary | ICD-10-CM

## 2015-04-23 MED ORDER — AZITHROMYCIN 250 MG PO TABS: ORAL_TABLET | ORAL | Status: AC

## 2015-04-23 NOTE — Patient Instructions (Signed)
Zpack take as directed.  Mucinex Twice daily  As needed  Cough/congestion  Call if bloody mucus returns  Chest xray today .  follow up Dr. Chase Caller in 3 months  Please contact office for sooner follow up if symptoms do not improve or worsen or seek emergency care

## 2015-04-23 NOTE — Assessment & Plan Note (Signed)
Mild flare with upper respiratory infection No further hemoptysis but will go ahead and treat with anabiotic Chest x-ray today  Plan Zpack take as directed.  Mucinex Twice daily  As needed  Cough/congestion  Call if bloody mucus returns  Chest xray today .  follow up Dr. Chase Thornton in 3 months  Please contact office for sooner follow up if symptoms do not improve or worsen or seek emergency care

## 2015-04-23 NOTE — Assessment & Plan Note (Signed)
Status post bronchoscopy with no endobronchial lesions noted and normal mucosa. Path was negative for malignant cells. AFB and fungal cultures are negative to date BAL cultures negative.  FOLLOW THE FINAL CULTURES

## 2015-04-23 NOTE — Progress Notes (Signed)
Subjective:    Patient ID: Cynthia Thornton, female    DOB: [birth year], 69 y.o.   MRN: [id number]  HPI    se Larose Kells, MD    reports that she quit [id number] - Her smoking use included Cigarettes. She has a 2 pack-year smoking history. She has never used smokeless tobacco.   IOV 07/26/2012   69 year old female. 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  Reports chronic cough x 7 months ([facility] 2013). Insidious onset. Does not remember preceding URI prior to onset of cough.   There is always associated hoarseness of voice,  clearing of throat, mucus in throat that is small in amount and she has difficulty getting it out (is creamy in color) which is all progressive.  No associated tickle in throat or sinus drainage. Talking makes symptoms worse. Says giving hx to me in office makes her voice hoarse. End of day makes her worse due to talking. SHe is frustrated that because talking is her job and talking make symptoms worse and she is unable to execute her job.   GERD hx  Has had GERD since 2003 and on nexium since. In July 2013 took medical help for cough and referred to  ENT eval: per her hx they told her it was GERD related cough but shey says that GERD symptoms are different from current cough. In terms of diet: drink tea, drinks diet pepsi 2-4 cans per day, but avoids pizza, fried food. Will have cheese on salad. Has only occasional chocolate. Eats bran cereal a day. Lunch is fruit and soup.   SINUS hx Denies post nasal sinus drainage. Denies tickle down throat ENT eval per hx in July 2013: normal sinuses. Trial of flonase July-August 2013: tried just one squirt and she did not like the sensation. So never used it.   ACE INHIBITOR Denies associated ACE inhibitor intake   SMOKING  Hx  1-3 cigs per day. Quit 16 years ago at age 89. Started smoking age 46.   FAm Hx  - mom died of lung cancer and had cough. Patient very concerned that cough is due to lung  cancer. I believe she feels guilty she might have missed her mom's cancer when her mom complained of chronic cough. She prefers upfront CT chest   CXR 05/30/12 1. No acute cardiopulmonary disease.  2. Hyperexpanded lungs with minimal linear heterogeneous opacities  within the right middle lobe and lingula, nonspecific possibly  atelectasis or scar, though chronic atypical infection (MAI) may  have a similar appearance.  Original Report Authenticated By: Rachel Moulds, M.D.  REC Please have CT chest without contrast  Please have breathing test called PFT  Return to see me after above     OV 10/11/2012 Fu cough  -Since last visit, without interventions , cough has improved 40%. She says drier weather was the reason for improvement. Says, more than cough, clearing of throat is the problem. REsults below of CT and PFT reviewed with patient. AT this stage, she feels she wants expectant Rx only. Not interested in further testing or intervention   PFT - normal   IMPRESSION:  CT chest 08/02/12 1. No acute cardiopulmonary abnormalities.  2. Lingular bronchiectasis  3. There are two clustered areas of nodularity within the  periphery of both lungs. These do not have worrisome features and  likely the sequela of atypical infection.  4. Liver and splenic hypodensities were present on the exam  from  10/07/2004 and likely reflect a combination of benign liver cyst  and hemangiomas.  Original Report Authenticated By: Angelita Ingles, M.D.  Glad you are much better Continue to control acid reflux You can even try voice rest for few days if cough returns As discussed and agreed, we are holding off further testing or treatment interventions Call [phone] and return if cough becomes a problem   09/24/2013 f/u ov/Wert re:  Chief Complaint  Patient presents with  . Acute Visit    Pt c/o hemoptysis x 2 days- She states that this is the first time she has had this problem. No change in her  breathing or other co's.    states she was not aware of dx of bronchiectasis or implications re risk of hemoptysis. Sputum did not really turn purulent before blood but the total amt is < tsp, no assoc epistaxis or sinus complaints     CTG sinus 09/30/13:  Mild mucosal thickening base of left maxillary sinus. No air-fluid  level.  REC Nexium + pepcid    OV 10/10/2013 ov/ [name]   Chief Complaint  Patient presents with  . Cough    follow-up.  Pt states she has not coughed up any more blood. She states cough is improved, but she does c/o having a "stuffy nose" all day.   Followup chronic cough; multifactorial in the setting of lingular bronchiectasis  Cough never really went away. I am seeing her after a year. A month ago she did see Dr. Legrand Como wert having acute onset of hemoptysis one time. Chest x-ray was unremarkable and CT scan of the sinus showed mild sinus thickening but otherwise unremarkable. It was felt that the hemoptysis was from her lingular bronchiectasis. However, she did not have any infection at that time. Currently cough persist. She points to her throat. She clears her throat all the time. Cough is laryngeal and quality. Mostly dry but occasionally she does bring mucus. She is compliant with her antihistamine and Nexium and Pepcid. RSI cough score is 10  This  Time she is open to Rx and getting rid of cough.   rec Cough is from possible sinus drainage/allergies, possible acid reflux, possible  asthma,  All of this is working together to cause cyclical cough/LPR cough  #Sinus drainage  - start  Hypertonic saline nasal spray -  take generic fluticasone inhaler 2 squirts each nostril daily - continue anti histamime  # Acid Reflux  -continue nexium and pepcid - avoid colas, spices, cheeses, spirits, red meats, beer, chocolates, fried foods etc.,   - sleep with head end of bed elevated  - eat small frequent meals  - do not go to bed for 3 hours after last  meal  # Poissible Asthma   - do methacholine challenge test  #Vocal Cord Dysfunction or Irritable Larynx  - will refer you to speech therapy with Mr Garald Balding -  please choose 3 days and observe complete voice rest - no talking or whispering  - at all times there  there is urge to cough, drink water or swallow or sip on throat lozenge  - start neurontin 300mg  po daily  X 3 days, then 300mg  po twice daily x 3 days, then 300mg  po three times daily to continue. 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 Acute Visit    Pt states had hemoptysis yesterday while on her way home from work. She went to ED and had cxr and was started on zithromax. She c/o chest tightness that she relates to anxiety since this did not start until after hemoptysis.   not a tablespoon total of blood and no antecedent purulent sputum or epistxis/cp or sob zpak started one day  prior to OV     No obvious day to day or daytime variabilty or assoc   subjective wheeze overt sinus or hb symptoms. No unusual exp hx or h/o childhood pna/ asthma or knowledge of premature birth.  Sleeping ok without nocturnal  or early am exacerbation  of respiratory  c/o's or need for noct saba. Also denies any obvious fluctuation of symptoms with weather or environmental changes or other aggravating or alleviating factors except as outlined above  Cough  REC Please schedule a follow up visit in 3 months but call sooner if needed with DR Chase Caller to discuss longterm management of your problem  OV 12/04/2014  Chief Complaint  Patient presents with  . Hemoptysis    ACUTE VISIT: MR patient; coughing up blood; CXR done today and printe for  review. Denies any wheezing or SOB         Followup chronic cough. Cynthia Thornton tells me that she no longer has chronic cough. However, in November 2014 she had a new onset of hemoptysis out of the blue. She showed me pictures it looks like she had flecks of hemoptysis. There is no associated infectious symptoms at that time. Episodes that'll spontaneously. Then again the same thing happened in May 2015. There was no associated fever, chest pain, syncope, wheezing, cough, yellow sputum. In between these episodes she has remained largely free of chronic cough except for some occasional mucus every few weeks./ D/w radiologist Dr Weber Cooks : based on prior imaging odds of pulmonary AVM low and hemoptuysis likely from bronchiectasis.  - A CT scan of the chest was in October 2013 that showed mild amount of lingular bronchiectasis   smoking:  reports that she quit smoking about 19 years ago. Her smoking use included Cigarettes. She has a 2 pack-year smoking history. She has never used smokeless tobacco.  12/04/14- Acute OV ACUTE VISIT: MR patient, former smoker, followed for bronchiectasis; coughing up blood; CXR done today and printed for review. Denies any wheezing or SOB  Last night she was sitting in a recliner after dinner, watching TV quietly when she began coughing up blood. Little cough or activity beforehand. More blood this time than she has seen before-scared her. She spoke to on-call doctor who sent Z-Pak. Bleeding almost stopped by this morning. No admixed purulent mucus, chest pain, unusual bruising or other bleeding, or fever. Previous episodes of hemoptysis one year ago and in April and September 2015, all self limited she has not been on anticoagulants/aspirin but uses occasional NSAID. CXR 12/04/14-images reviewed with her FINDINGS: The lungs are mildly hyperinflated. There is no focal infiltrate. There is no pulmonary parenchymal mass nor abnormal nodule. There is no pleural effusion. The  heart and pulmonary vascularity are normal. There is mild dextro curvature of the thoracolumbar spine which is stable. IMPRESSION: COPD. There is no acute cardiopulmonary abnormality. If the patient's hemoptysis persists, chest CT scanning is  recommended. Electronically Signed  By: David Martinique  On: 12/04/2014 10:14    OV 01/15/2015  Chief Complaint  Patient presents with  . Follow-up    Pt had an episode of hemoptysis and say CY on 12/04/2014. After pt completed Zpak it resolved. Pt denies recent SOB, cough and CP/tightness.     Follow-up hemoptysis  I used to see this patient for chronic cough that was idiopathic. But this is no longer a problem. Recently she's been suffering from intermittent hemoptysis. Most recently she saw my colleague Dr. Annamaria Boots in February 2016 for hemoptysis. Chest x-ray was fairly clear. A 2013 CT scan of the chest suggested that a lingular bronchiectasis was possibly the source of her bronchial hemoptysis. In any event she got treated with Z-Pak and hemoptysis resolved. The episode was fairly significant that she is frightened about it and she wants extensive workup currently she is asymptomatic though. She wants CT scan of the chest and bronchoscopy  04/23/2015 Follow up : Bronchiectasis /Hemoptysis  Pt returns for follow up  Underwent FOB on 03/16/15  FOB notes reviewed with nml mucosa and no endobronchial lesions noted. Path showed no malignant cells.  AFB and Fungal cx are neg to date.  BAL cx neg.  She has been doing well . Did have episode 1 week with with cough with bloody mucus. Feels she is getting a cold , has had no further hemoptysis .  Says she usually has bloody mucus episode every 3-4 month.  She denies any fever, chest pain, orthopnea, PND or leg swelling   ROS  Constitutional: Negative for fever and unexpected weight change.  HENT: Negative for congestion, dental problem, ear pain, nosebleeds, postnasal drip, rhinorrhea, sinus pressure,  sneezing, sore throat and trouble swallowing.   Eyes: Negative for redness and itching.  Respiratory: Negative for cough, chest tightness, shortness of breath and wheezing.   Cardiovascular: Negative for palpitations and leg swelling.  Gastrointestinal: Negative for nausea and vomiting.  Genitourinary: Negative for dysuria.  Musculoskeletal: Negative for joint swelling.  Skin: Negative for rash.  Neurological: Negative for headaches.  Hematological: Does not bruise/bleed easily.  Psychiatric/Behavioral: Negative for dysphoric mood. The patient is not nervous/anxious.        Objective:   Physical Exam  Constitutional: She is oriented to person, place, and time. She appears well-developed and well-nourished. No distress.  HENT:  Head: Normocephalic and atraumatic.  Right Ear: External ear normal.  Left Ear: External ear normal.  Mouth/Throat: Oropharynx is clear and moist. No oropharyngeal exudate.  Eyes: Conjunctivae and EOM are normal. Pupils are equal, round, and reactive to light. Right eye exhibits no discharge. Left eye exhibits no discharge. No scleral icterus.  Neck: Normal range of motion. Neck supple. No JVD present. No tracheal deviation present. No thyromegaly present.  Cardiovascular: Normal rate, regular rhythm, normal heart sounds and intact distal pulses.  Exam reveals no gallop and no friction rub.   No murmur heard. Pulmonary/Chest: Effort normal and breath sounds normal. No respiratory distress. She has no wheezes. She has no rales. She exhibits no tenderness.  Abdominal: Soft. Bowel sounds are normal. She exhibits no distension and no mass. There is no tenderness. There is no rebound and no guarding.  Musculoskeletal: Normal range of motion. She exhibits no edema or tenderness.  Lymphadenopathy:    She has no cervical adenopathy.  Neurological: She is alert and oriented to person, place, and time. She has normal reflexes. No cranial nerve deficit. She exhibits normal  muscle  tone. Coordination normal.  Skin: Skin is warm and dry. No rash noted. She is not diaphoretic. No erythema. No pallor.  Psychiatric: She has a normal mood and affect. Her behavior is normal. Judgment and thought content normal.  Vitals reviewed.       Assessment & Plan:

## 2015-04-26 ENCOUNTER — Telehealth: Payer: Self-pay | Admitting: Internal Medicine

## 2015-04-26 NOTE — Telephone Encounter (Signed)
Result Note     No acute findings     Cont w/ ov recs     Please contact office for sooner follow up if symptoms do not improve or worsen or seek emergency care   --   I spoke with patient about results and she verbalized understanding and had no questions

## 2015-04-27 ENCOUNTER — Ambulatory Visit (INDEPENDENT_AMBULATORY_CARE_PROVIDER_SITE_OTHER): Payer: Medicare Other | Admitting: Endocrinology

## 2015-04-27 ENCOUNTER — Encounter: Payer: Self-pay | Admitting: Endocrinology

## 2015-04-27 VITALS — BP 107/64 | HR 83 | Temp 97.7°F | Ht 64.0 in | Wt 159.0 lb

## 2015-04-27 DIAGNOSIS — E042 Nontoxic multinodular goiter: Secondary | ICD-10-CM

## 2015-04-27 DIAGNOSIS — E0580 Other thyrotoxicosis without thyrotoxic crisis or storm: Secondary | ICD-10-CM

## 2015-04-27 LAB — TSH: TSH: 3.56 u[IU]/mL (ref 0.35–4.50)

## 2015-04-27 NOTE — Progress Notes (Signed)
Subjective:    Patient ID: Cynthia Thornton, female    DOB: 13-Oct-1946, 69 y.o.   MRN: [id number]  HPI pt has h/o hyperthyroidism due to a multinodular goiter (dx'ed 2009; [place] in 2013 showed multiple small nodules we unchanged; she was given thionamide rx for a few months in 2010, but she went off due to normal TFT, and hasn't needed any since).  she does not notice the goiter.  pt states she feels well in general.   Past Medical History  Diagnosis Date  . Osteopenia 01/2014    T score -1.5 FRAX 8.9%/1.0%  . DUB (dysfunctional uterine bleeding)   . Arthritis   . Hiatal hernia 08/15/2006    EGD  . Esophageal reflux 08/15/2006    EGD(Chronic)  . Hyperthyroidism   . Esophageal stricture 08/15/2006    EGD  . Personal history of colonic polyps 09/06/2001    hyperplastic  . Pityriasis rosea     Past Surgical History  Procedure Laterality Date  . Tonsillectomy    . Breast surgery  2000    Breast cyst removed, left  . Vaginal hysterectomy  1991    Right ovarian cystectomy  . Video bronchoscopy Bilateral 03/16/2015    Procedure: VIDEO BRONCHOSCOPY WITHOUT FLUORO;  Surgeon: Brand Males, MD;  Location: [facility] ENDOSCOPY;  Service: Endoscopy;  Laterality: Bilateral;    History   Social History  . Marital Status: Divorced    Spouse Name: N/A  . Number of Children: 2  . Years of Education: N/A   Occupational History  . teacher [employer]  . retired Corporate investment banker    Social History Main Topics  . Smoking status: Former Smoker -- 0.20 packs/day for 10 years    Types: Cigarettes    Quit date: 10/31/1995  . Smokeless tobacco: Never Used  . Alcohol Use: 0.0 oz/week    0 Standard drinks or equivalent per week     Comment: occ  . Drug Use: No  . Sexual Activity: Not Currently    Birth Control/ Protection: Surgical     Comment: 1st intercourse 69 yo-Fewer than 5 partners   Other Topics Concern  . Not on file   Social History Narrative    Current Outpatient  Prescriptions on File Prior to Visit  Medication Sig Dispense Refill  . Cetirizine HCl (ZYRTEC PO) Take by mouth.    . cholecalciferol (VITAMIN D) 1000 UNITS tablet Take 1,000 Units by mouth daily.    . clorazepate (TRANXENE) 7.5 MG tablet Take 1 tablet by mouth as needed.    Marland Kitchen esomeprazole (NEXIUM) 40 MG packet Take 40 mg by mouth daily before breakfast. 30 each 12  . famotidine (PEPCID) 20 MG tablet One at bedtime 30 tablet 11  . omeprazole (PRILOSEC) 40 MG capsule Take 1 capsule (40 mg total) by mouth daily. 90 capsule 3  . azithromycin (ZITHROMAX Z-PAK) 250 MG tablet Take 2 tablets (500 mg) on  Day 1,  followed by 1 tablet (250 mg) once daily on Days 2 through 5. (Patient not taking: Reported on 04/27/2015) 6 each 0   No current facility-administered medications on file prior to visit.    Allergies  Allergen Reactions  . Codeine Nausea Only  . Propranolol Hcl Other (See Comments)    dizziness    Family History  Problem Relation Age of Onset  . Osteoporosis Mother   . Lung cancer Mother   . Stroke Mother   . Diabetes Father   .  Hypertension Father   . Osteoporosis Father   . Colon cancer Father 47  . Breast cancer Sister     Age 45  . Ovarian cancer Maternal Aunt 60  . Breast cancer Cousin     maternal-Age 67  . Breast cancer Maternal Aunt     great aunt- Age unknown  . Colon cancer Paternal Uncle 49  . Colon cancer Cousin     maternal  . Crohn's disease Son     BP 107/64 mmHg  Pulse 83  Temp(Src) 97.7 F (36.5 C) (Oral)  Ht 5\' 4"  (1.626 m)  Wt 159 lb (72.122 kg)  BMI 27.28 kg/m2  SpO2 96%  Review of Systems Denies neck pain.    Objective:   Physical Exam VITAL SIGNS:  See vs page GENERAL: no distress NECK: There is no palpable thyroid enlargement.  No thyroid nodule is palpable.  No palpable lymphadenopathy at the anterior neck.  Lab Results  Component Value Date   TSH 3.56 04/27/2015   T4TOTAL 8.5 08/31/2011       Assessment & Plan:    Hyperthyroidism: has not recurred yet, but may with time. Multinodular goiter: due for f/u US  Patient is advised the following: Patient Instructions  A thyroid blood test is requested for you today.  We'll contact you with results. Let's recheck the ultrasound.  you will receive a phone call, about a day and time for an appointment Please return in 2 years.  most of the time, a "lumpy thyroid" will eventually become overactive again.  this is usually a slow process, happening over the span of many years.

## 2015-04-27 NOTE — Patient Instructions (Addendum)
A thyroid blood test is requested for you today.  We'll contact you with results. Let's recheck the ultrasound.  you will receive a phone call, about a day and time for an appointment Please return in 2 years.  most of the time, a "lumpy thyroid" will eventually become overactive again.  this is usually a slow process, happening over the span of many years.

## 2015-04-28 LAB — AFB CULTURE WITH SMEAR (NOT AT ARMC)
Acid Fast Smear: NONE SEEN
Special Requests: NORMAL

## 2015-05-05 ENCOUNTER — Other Ambulatory Visit: Payer: Medicare Other

## 2015-05-10 ENCOUNTER — Other Ambulatory Visit: Payer: Medicare Other

## 2015-05-10 ENCOUNTER — Ambulatory Visit
Admission: RE | Admit: 2015-05-10 | Discharge: 2015-05-10 | Disposition: A | Discharge status: Home / Self Care | Payer: Medicare Other | Source: Ambulatory Visit | Attending: Endocrinology | Admitting: Endocrinology

## 2015-05-10 DIAGNOSIS — E042 Nontoxic multinodular goiter: Secondary | ICD-10-CM

## 2015-05-26 ENCOUNTER — Encounter: Payer: Medicare Other | Admitting: Internal Medicine

## 2015-06-22 LAB — LEGIONELLA CULTURE: Special Requests: NORMAL

## 2015-07-22 ENCOUNTER — Other Ambulatory Visit: Payer: Self-pay

## 2015-07-22 ENCOUNTER — Telehealth: Payer: Self-pay

## 2015-07-22 ENCOUNTER — Telehealth: Payer: Self-pay | Admitting: Internal Medicine

## 2015-07-22 DIAGNOSIS — K7689 Other specified diseases of liver: Secondary | ICD-10-CM

## 2015-07-22 NOTE — Telephone Encounter (Signed)
Pt scheduled for MRI of liver at [facility] (liver protocol) 07/27/15@7am , pt to arrive there at 6:45am and be NPO after midnight. Left message for pt to call back.

## 2015-07-22 NOTE — Telephone Encounter (Signed)
Pt not sure if this appt will work, would have to be finished by 8am. Pt given the phone number [phone] to reschedule appt if needed and to check on the time MRI would be done.

## 2015-07-22 NOTE — Telephone Encounter (Signed)
Pt scheduled for 6 mth follow-up MRI to eval large liver cyst 08/06/15. Pt is requesting something be called in to help her relax for MRI. Please advise.

## 2015-07-22 NOTE — Telephone Encounter (Signed)
-----   Message from Hulan Saas, RN sent at 01/08/2015  1:37 PM EST ----- Patient needs 6 month F/U MRI with liver protocol to f/u on large cyst.(Pyrtle) see 01/07/15 phone note.

## 2015-07-22 NOTE — Telephone Encounter (Signed)
Pt aware and script sent to the pharmacy. 

## 2015-07-22 NOTE — Telephone Encounter (Signed)
Ativan 1 mg PO x 1 30 min to 1 hr before scan

## 2015-07-23 ENCOUNTER — Encounter: Payer: Self-pay | Admitting: Internal Medicine

## 2015-07-23 ENCOUNTER — Ambulatory Visit (INDEPENDENT_AMBULATORY_CARE_PROVIDER_SITE_OTHER): Payer: Medicare Other | Admitting: Internal Medicine

## 2015-07-23 VITALS — BP 120/82 | HR 90 | Ht 64.0 in | Wt 161.0 lb

## 2015-07-23 DIAGNOSIS — Z23 Encounter for immunization: Secondary | ICD-10-CM | POA: Diagnosis not present

## 2015-07-23 DIAGNOSIS — J479 Bronchiectasis, uncomplicated: Secondary | ICD-10-CM

## 2015-07-23 NOTE — Progress Notes (Signed)
Subjective:    Patient ID: Cynthia Thornton, female    DOB: April 30, 1946, 69 y.o.   MRN: [id number]  HPI   OV 07/23/2015  Chief Complaint  Patient presents with  . Follow-up    Pt states her breathing is doing well. Pt denies hemoptysis since seeing TP in 03/2015. Pt c/o mild prod cough with Stinger mucus. Pt denies CP/tightness.     69 year old female follow-up for wound originally followed several years ago for chronic cough but most recently lingular bronchiectasis characterized by intermittent hemoptysis during exacerbations. Negative fiberoptic bronchoscopy mid 2016  Reports no hemoptysis in several months. No shortness of breath. Has mild baseline occasional cough that is completely tolerable for which she does not feel her quality of life is impacted or wants any specific therapy. There are no new issues. She is only worried about recurrence of hemoptysis that Happens every 5 months but otherwise she is good. She will have flu shot today   reports that she quit smoking about 19 years ago. Her smoking use included Cigarettes. She has a 2 pack-year smoking history. She has never used smokeless tobacco.   Current outpatient prescriptions:  .  Cetirizine HCl (ZYRTEC PO), Take by mouth., Disp: , Rfl:  .  cholecalciferol (VITAMIN D) 1000 UNITS tablet, Take 1,000 Units by mouth daily., Disp: , Rfl:  .  clorazepate (TRANXENE) 7.5 MG tablet, Take 1 tablet by mouth as needed., Disp: , Rfl:  .  famotidine (PEPCID) 20 MG tablet, One at bedtime, Disp: 30 tablet, Rfl: 11 .  omeprazole (PRILOSEC) 40 MG capsule, Take 1 capsule (40 mg total) by mouth daily., Disp: 90 capsule, Rfl: 3  Immunization History  Administered Date(s) Administered  . Influenza Split 08/31/2011, 07/30/2014  . Influenza Whole 08/30/2009, 07/30/2010  . Influenza,inj,Quad PF,36+ Mos 09/30/2012, 10/10/2013  . Pneumococcal Conjugate-13 10/10/2013  . Td 12/02/2008  . Zoster 12/02/2008    Allergies  Allergen Reactions  .  Codeine Nausea Only  . Propranolol Hcl Other (See Comments)    dizziness      Review of Systems  Constitutional: Negative for fever and unexpected weight change.  HENT: Negative for congestion, dental problem, ear pain, nosebleeds, postnasal drip, rhinorrhea, sinus pressure, sneezing, sore throat and trouble swallowing.   Eyes: Negative for redness and itching.  Respiratory: Positive for cough. Negative for chest tightness, shortness of breath and wheezing.   Cardiovascular: Negative for palpitations and leg swelling.  Gastrointestinal: Negative for nausea and vomiting.  Genitourinary: Negative for dysuria.  Musculoskeletal: Negative for joint swelling.  Skin: Negative for rash.  Neurological: Negative for headaches.  Hematological: Does not bruise/bleed easily.  Psychiatric/Behavioral: Negative for dysphoric mood. The patient is not nervous/anxious.        Objective:   Physical Exam  Constitutional: She is oriented to person, place, and time. She appears well-developed and well-nourished. No distress.  HENT:  Head: Normocephalic and atraumatic.  Right Ear: External ear normal.  Left Ear: External ear normal.  Mouth/Throat: Oropharynx is clear and moist. No oropharyngeal exudate.  Eyes: Conjunctivae and EOM are normal. Pupils are equal, round, and reactive to light. Right eye exhibits no discharge. Left eye exhibits no discharge. No scleral icterus.  Neck: Normal range of motion. Neck supple. No JVD present. No tracheal deviation present. No thyromegaly present.  Cardiovascular: Normal rate, regular rhythm, normal heart sounds and intact distal pulses.  Exam reveals no gallop and no friction rub.   No murmur heard. Pulmonary/Chest: Effort normal and breath sounds  normal. No respiratory distress. She has no wheezes. She has no rales. She exhibits no tenderness.  Abdominal: Soft. Bowel sounds are normal. She exhibits no distension and no mass. There is no tenderness. There is no  rebound and no guarding.  Musculoskeletal: Normal range of motion. She exhibits no edema or tenderness.  Lymphadenopathy:    She has no cervical adenopathy.  Neurological: She is alert and oriented to person, place, and time. She has normal reflexes. No cranial nerve deficit. She exhibits normal muscle tone. Coordination normal.  Skin: Skin is warm and dry. No rash noted. She is not diaphoretic. No erythema. No pallor.  Psychiatric: She has a normal mood and affect. Her behavior is normal. Judgment and thought content normal.  Vitals reviewed.   Filed Vitals:   07/23/15 1417  BP: 120/82  Pulse: 90  Height: 5\' 4"  (1.626 m)  Weight: 161 lb (73.029 kg)  SpO2: 97%         Assessment & Plan:     ICD-9-CM ICD-10-CM   1. Bronchiectasis without complication 948.0 X65.5     Glad you are doing well Call for any chest cold, coughing blood or any respiratory complaints that are worse or significant Otherwise, observation therapy only given very mild baseline cough that is tolerable Flu shot 07/23/2015  Followup  9 months or sooner if needed    Dr. Brand Males, M.D., [facility].C.P Pulmonary and Critical Care Medicine Staff Physician [facility] Pager: [phone], If no answer or between  15:00h - 7:00h: call [phone]  07/23/2015 2:34 PM

## 2015-07-23 NOTE — Patient Instructions (Signed)
ICD-9-CM ICD-10-CM   1. Bronchiectasis without complication 833.7 O45.1    Glad you are doing well Call for any chest cold, coughing blood or any respiratory complaints that are worse or significant Otherwise, observation therapy only Flu shot 07/23/2015  Followup  9 months or sooner if needed

## 2015-07-26 ENCOUNTER — Telehealth: Payer: Self-pay | Admitting: *Deleted

## 2015-07-26 ENCOUNTER — Other Ambulatory Visit: Payer: Self-pay

## 2015-07-26 DIAGNOSIS — Z23 Encounter for immunization: Secondary | ICD-10-CM | POA: Diagnosis not present

## 2015-07-26 NOTE — Telephone Encounter (Signed)
Unable to reach patient at time of Pre-Visit Call.  Left message for patient to return call when available.    

## 2015-07-27 ENCOUNTER — Ambulatory Visit (HOSPITAL_COMMUNITY): Payer: Medicare Other

## 2015-07-27 ENCOUNTER — Ambulatory Visit (INDEPENDENT_AMBULATORY_CARE_PROVIDER_SITE_OTHER): Payer: Medicare Other | Admitting: Internal Medicine

## 2015-07-27 ENCOUNTER — Encounter: Payer: Self-pay | Admitting: Internal Medicine

## 2015-07-27 VITALS — BP 112/74 | HR 68 | Temp 97.8°F | Ht 64.0 in | Wt 160.2 lb

## 2015-07-27 DIAGNOSIS — Z09 Encounter for follow-up examination after completed treatment for conditions other than malignant neoplasm: Secondary | ICD-10-CM | POA: Insufficient documentation

## 2015-07-27 DIAGNOSIS — Z114 Encounter for screening for human immunodeficiency virus [HIV]: Secondary | ICD-10-CM

## 2015-07-27 DIAGNOSIS — Z23 Encounter for immunization: Secondary | ICD-10-CM | POA: Diagnosis not present

## 2015-07-27 DIAGNOSIS — E780 Pure hypercholesterolemia: Secondary | ICD-10-CM

## 2015-07-27 DIAGNOSIS — Z0000 Encounter for general adult medical examination without abnormal findings: Secondary | ICD-10-CM | POA: Diagnosis not present

## 2015-07-27 DIAGNOSIS — Z1159 Encounter for screening for other viral diseases: Secondary | ICD-10-CM

## 2015-07-27 HISTORY — DX: Encounter for general adult medical examination without abnormal findings: Z00.00

## 2015-07-27 MED ORDER — CLORAZEPATE DIPOTASSIUM 7.5 MG PO TABS: 7.5000 mg | ORAL_TABLET | Freq: Every day | ORAL | Status: DC | PRN

## 2015-07-27 NOTE — Progress Notes (Signed)
Pre visit review using our clinic review tool, if applicable. No additional management support is needed unless otherwise documented below in the visit note. 

## 2015-07-27 NOTE — Assessment & Plan Note (Signed)
Td 2010; zostavax 2010, prevanr 2014; pnm shot 23 --->07-2015; had a flu shot  CCS: Last colonoscopy 08-2015, + polyps, 5 years. Patient concerned about not having another colonoscopy in 3 years. Will consider discuss with GI by 2019 Female care: Sees gynecology Diet and exercise discussed Labs: FLP, CBC, hep C EKG: Sinus rhythm, no acute changes

## 2015-07-27 NOTE — Patient Instructions (Addendum)
Get your blood work before you leave   Next visit in one year for a physical exam. Fasting.    Fall Prevention and Home Safety Falls cause injuries and can affect all age groups. It is possible to use preventive measures to significantly decrease the likelihood of falls. There are many simple measures which can make your home safer and prevent falls. OUTDOORS  Repair cracks and edges of walkways and driveways.  Remove high doorway thresholds.  Trim shrubbery on the main path into your home.  Have good outside lighting.  Clear walkways of tools, rocks, debris, and clutter.  Check that handrails are not broken and are securely fastened. Both sides of steps should have handrails.  Have leaves, snow, and ice cleared regularly.  Use sand or salt on walkways during winter months.  In the garage, clean up grease or oil spills. BATHROOM  Install night lights.  Install grab bars by the toilet and in the tub and shower.  Use non-skid mats or decals in the tub or shower.  Place a plastic non-slip stool in the shower to sit on, if needed.  Keep floors dry and clean up all water on the floor immediately.  Remove soap buildup in the tub or shower on a regular basis.  Secure bath mats with non-slip, double-sided rug tape.  Remove throw rugs and tripping hazards from the floors. BEDROOMS  Install night lights.  Make sure a bedside light is easy to reach.  Do not use oversized bedding.  Keep a telephone by your bedside.  Have a firm chair with side arms to use for getting dressed.  Remove throw rugs and tripping hazards from the floor. KITCHEN  Keep handles on pots and pans turned toward the center of the stove. Use back burners when possible.  Clean up spills quickly and allow time for drying.  Avoid walking on wet floors.  Avoid hot utensils and knives.  Position shelves so they are not too high or low.  Place commonly used objects within easy reach.  If  necessary, use a sturdy step stool with a grab bar when reaching.  Keep electrical cables out of the way.  Do not use floor polish or wax that makes floors slippery. If you must use wax, use non-skid floor wax.  Remove throw rugs and tripping hazards from the floor. STAIRWAYS  Never leave objects on stairs.  Place handrails on both sides of stairways and use them. Fix any loose handrails. Make sure handrails on both sides of the stairways are as long as the stairs.  Check carpeting to make sure it is firmly attached along stairs. Make repairs to worn or loose carpet promptly.  Avoid placing throw rugs at the top or bottom of stairways, or properly secure the rug with carpet tape to prevent slippage. Get rid of throw rugs, if possible.  Have an electrician put in a light switch at the top and bottom of the stairs. OTHER FALL PREVENTION TIPS  Wear low-heel or rubber-soled shoes that are supportive and fit well. Wear closed toe shoes.  When using a stepladder, make sure it is fully opened and both spreaders are firmly locked. Do not climb a closed stepladder.  Add color or contrast paint or tape to grab bars and handrails in your home. Place contrasting color strips on first and last steps.  Learn and use mobility aids as needed. Install an electrical emergency response system.  Turn on lights to avoid dark areas. Replace light bulbs  that burn out immediately. Get light switches that glow.  Arrange furniture to create clear pathways. Keep furniture in the same place.  Firmly attach carpet with non-skid or double-sided tape.  Eliminate uneven floor surfaces.  Select a carpet pattern that does not visually hide the edge of steps.  Be aware of all pets. OTHER HOME SAFETY TIPS  Set the water temperature for 120 F (48.8 C).  Keep emergency numbers on or near the telephone.  Keep smoke detectors on every level of the home and near sleeping areas. Document Released: 10/06/2002  Document Revised: 04/16/2012 Document Reviewed: 01/05/2012 [facility] Patient Information 2015 [place], [place]. This information is not intended to replace advice given to you by your health care provider. Make sure you discuss any questions you have with your health care provider.   Preventive Care for Adults Ages 57 and over  Blood pressure check.** / Every 1 to 2 years.  Lipid and cholesterol check.**/ Every 5 years beginning at age 81.  Lung cancer screening. / Every year if you are aged 44-80 years and have a 30-pack-year history of smoking and currently smoke or have quit within the past 15 years. Yearly screening is stopped once you have quit smoking for at least 15 years or develop a health problem that would prevent you from having lung cancer treatment.  Fecal occult blood test (FOBT) of stool. / Every year beginning at age 58 and continuing until age 53. You may not have to do this test if you get a colonoscopy every 10 years.  Flexible sigmoidoscopy** or colonoscopy.** / Every 5 years for a flexible sigmoidoscopy or every 10 years for a colonoscopy beginning at age 63 and continuing until age 72.  Hepatitis C blood test.** / For all people born from [birth year] through 1963 and any individual with known risks for hepatitis C.  Abdominal aortic aneurysm (AAA) screening.** / A one-time screening for ages 67 to 58 years who are current or former smokers.  Skin self-exam. / Monthly.  Influenza vaccine. / Every year.  Tetanus, diphtheria, and acellular pertussis (Tdap/Td) vaccine.** / 1 dose of Td every 10 years.  Varicella vaccine.** / Consult your health care provider.  Zoster vaccine.** / 1 dose for adults aged 73 years or older.  Pneumococcal 13-valent conjugate (PCV13) vaccine.** / Consult your health care provider.  Pneumococcal polysaccharide (PPSV23) vaccine.** / 1 dose for all adults aged 63 years and older.  Meningococcal vaccine.** / Consult your health care  provider.  Hepatitis A vaccine.** / Consult your health care provider.  Hepatitis B vaccine.** / Consult your health care provider.  Haemophilus influenzae type b (Hib) vaccine.** / Consult your health care provider. **Family history and personal history of risk and conditions may change your health care provider's recommendations. Document Released: 12/12/2001 Document Revised: 10/21/2013 Document Reviewed: 03/13/2011 [facility] Patient Information 2015 [place], [place]. This information is not intended to replace advice given to you by your health care provider. Make sure you discuss any questions you have with your health care provider.

## 2015-07-27 NOTE — Assessment & Plan Note (Signed)
Anxiety, mild depression: Triggered by issues with her son, counseled, has used Tranxene for a while on-off  previously prescribed by gynecology,will start to prescribe tranxene from this office. Declined  other medications such as SSRI.  DJD, having problems with knee pain, follow-up by orthopedic surgery. Other medical problems -- stable Return to the office one year

## 2015-07-27 NOTE — Progress Notes (Signed)
Subjective:    Patient ID: Cynthia Thornton, female    DOB: 1946/06/05, 69 y.o.   MRN: [id number]  DOS:  07/27/2015 Type of visit - description : cpx Interval history: Here for Medicare AWV:  1. Risk factors based on Past M, S, F history: reviewed 2. Physical Activities: still works, takes walks ~ 1/week 3. Depression/mood: ++ stress related to son's health, + anxiety >depression (mild)  4. Hearing:  No problems noted or reported  5. ADL's: independent, drives  6. Fall Risk: no recent falls, prevention discussed , see AVS 7. home Safety: does feel safe at home  8. Height, weight, & visual acuity: see VS, sees eye doctor regulalrly 9. Counseling: provided 10. Labs ordered based on risk factors: if needed  11. Referral Coordination: if needed 12. Care Plan, see assessment and plan , written personalized plan provided , see AVS 13. Cognitive Assessment: motor skills and cognition appropriate for age 60. Care team updated  15. 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 No chills. No unexplained wt changes. No unusual sweats  HEENT: No dental problems, no ear discharge, no facial swelling, no voice changes. No eye discharge, no eye  redness , no  intolerance to light   Respiratory: No wheezing , no  difficulty breathing. No cough , no mucus production  Cardiovascular: No CP, no leg swelling , no  Palpitations  GI: no nausea, no vomiting, no diarrhea , no  abdominal pain.  No blood in the stools. No dysphagia, no odynophagia    Endocrine: No polyphagia, no polyuria , no polydipsia  GU: No dysuria, gross hematuria, difficulty urinating. No urinary urgency, no frequency.  Musculoskeletal: Knee pain is getting worse bilaterally Skin: Nopalor , no  Rash  Allergic, immunologic: No environmental allergies ,  no  food allergies  Neurological: No dizziness no  syncope. No headaches. No diplopia, no slurred, no slurred speech, no motor deficits, no facial  Numbness  Hematological: No enlarged lymph nodes, no easy bruising , no unusual bleedings  Psychiatry: No suicidal ideas, no hallucinations, no beavior problems   Past Medical History  Diagnosis Date  . Osteopenia 01/2014    T score -1.5 FRAX 8.9%/1.0%  . DUB (dysfunctional uterine bleeding)   . Arthritis   . Hiatal hernia 08/15/2006    EGD  . Esophageal reflux 08/15/2006    EGD(Chronic)  . Hyperthyroidism   . Esophageal stricture 08/15/2006    EGD  . Personal history of colonic polyps 09/06/2001    hyperplastic  . Pityriasis rosea   . 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 Tonsillectomy    . Breast surgery  2000    Breast cyst removed, left  . Vaginal hysterectomy  1991    Right ovarian cystectomy  . Video bronchoscopy Bilateral 03/16/2015    Procedure: VIDEO BRONCHOSCOPY WITHOUT FLUORO;  Surgeon: Brand Males, MD;  Location: [facility] ENDOSCOPY;  Service: Endoscopy;  Laterality: Bilateral;    Social History   Social History  . Marital Status: Divorced    Spouse Name: N/A  . Number of Children: 2  . Years of Education: N/A   Occupational History  . Pharmacist, hospital, works part time  [employer]  History Main Topics  . Smoking status: Former Smoker -- 0.20 packs/day for 10 years    Types: Cigarettes    Quit date: 10/31/1995  . Smokeless tobacco: Never Used  . Alcohol Use: 0.0 oz/week    0 Standard drinks or equivalent per week     Comment: occ  . Drug Use: No  . Sexual Activity: Not Currently    Birth Control/ Protection: Surgical     Comment: 1st intercourse 69 yo-Fewer than 5 partners   Other Topics Concern  . Not on file   Social History Narrative   Lives by herself   2 children, one has mental  issues      Family History  Problem Relation Age of Onset  . Osteoporosis Mother   . Lung cancer Mother   . Stroke Mother   . Diabetes Father   . Hypertension Father   . Osteoporosis Father   . Colon cancer Father 39  . Breast cancer Sister     Age 74  . Ovarian cancer Maternal Aunt 60  . Breast cancer Cousin     maternal-Age 25  . Breast cancer Maternal Aunt     great aunt- Age unknown  . Colon cancer Paternal Uncle 54  . Colon cancer Cousin     maternal  . Crohn's disease Son        Medication List       This list is accurate as of: 07/27/15  5:04 PM.  Always use your most recent med list.               cholecalciferol 1000 UNITS tablet  Commonly known as:  VITAMIN D  Take 1,000 Units by mouth daily.     clorazepate 7.5 MG tablet  Commonly known as:  TRANXENE  Take 1 tablet (7.5 mg total) by mouth daily as needed.     famotidine 20 MG tablet  Commonly known as:  PEPCID  One at bedtime     omeprazole 40 MG capsule  Commonly known as:  PRILOSEC  Take 1 capsule (40 mg total) by mouth daily.     ZYRTEC PO  Take by mouth.           Objective:   Physical Exam BP 112/74 mmHg  Pulse 68  Temp(Src) 97.8 F (36.6 C) (Oral)  Ht 5\' 4"  (1.626 m)  Wt 160 lb 4 oz (72.689 kg)  BMI 27.49 kg/m2  SpO2 98% General:   Well developed, well nourished . NAD.  HEENT:  Normocephalic . Face symmetric, atraumatic Lungs:  CTA B Normal respiratory effort, no intercostal retractions, no accessory muscle use. Heart: RRR,  no murmur.  no pretibial edema bilaterally  Abdomen:  Not distended, soft, non-tender. No rebound or rigidity. No bruit  Skin: Not pale. Not jaundice Neurologic:  alert & oriented X3.  Speech normal, gait appropriate for age and unassisted Psych--  Cognition and judgment appear intact.  Cooperative with normal attention span and concentration.  Behavior appropriate. No anxious or depressed appearing.    Assessment & Plan:    Assessment> Bronchiectasis Dr Chase Caller Endocrinology Dr. Loanne Drilling --H/o thyrotoxicosis on remission --Goiter   Osteopenia --T score -1.5  (2015) DJD -- [facility] GI: GERD, HH and esophageal stricture EGD 2007, colon polyps  Plan Anxiety, mild depression: Triggered by issues with her son, counseled, has used Tranxene for a while on-off  previously prescribed by gynecology,will start to prescribe tranxene from this office. Declined  other medications such as SSRI.  DJD, having problems with knee pain, follow-up by orthopedic surgery. Other medical problems -- stable Return to the office one year

## 2015-07-28 LAB — CBC WITH DIFFERENTIAL/PLATELET
Basophils Absolute: 0 10*3/uL (ref 0.0–0.1)
Basophils Relative: 0.6 % (ref 0.0–3.0)
EOS ABS: 0.1 10*3/uL (ref 0.0–0.7)
Eosinophils Relative: 1.6 % (ref 0.0–5.0)
HEMATOCRIT: 36.8 % (ref 36.0–46.0)
Hemoglobin: 12.7 g/dL (ref 12.0–15.0)
LYMPHS PCT: 36.6 % (ref 12.0–46.0)
Lymphs Abs: 2.3 10*3/uL (ref 0.7–4.0)
MCHC: 34.5 g/dL (ref 30.0–36.0)
MCV: 88.4 fl (ref 78.0–100.0)
Monocytes Absolute: 0.5 10*3/uL (ref 0.1–1.0)
Monocytes Relative: 8.2 % (ref 3.0–12.0)
Neutro Abs: 3.4 10*3/uL (ref 1.4–7.7)
Neutrophils Relative %: 53 % (ref 43.0–77.0)
PLATELETS: 280 10*3/uL (ref 150.0–400.0)
RBC: 4.16 Mil/uL (ref 3.87–5.11)
RDW: 14.1 % (ref 11.5–15.5)
WBC: 6.4 10*3/uL (ref 4.0–10.5)

## 2015-07-28 LAB — LIPID PANEL
Cholesterol: 207 mg/dL — ABNORMAL HIGH (ref 0–200)
HDL: 93 mg/dL (ref >39.00)
LDL Cholesterol: 104 mg/dL — ABNORMAL HIGH (ref 0–99)
NonHDL: 114.15
Total CHOL/HDL Ratio: 2
Triglycerides: 50 mg/dL (ref 0.0–149.0)
VLDL: 10 mg/dL (ref 0.0–40.0)

## 2015-07-28 LAB — HEPATITIS C ANTIBODY: HCV AB: NEGATIVE

## 2015-07-28 LAB — HIV ANTIBODY (ROUTINE TESTING W REFLEX): HIV 1&2 Ab, 4th Generation: NONREACTIVE

## 2015-08-06 ENCOUNTER — Ambulatory Visit
Admission: RE | Admit: 2015-08-06 | Discharge: 2015-08-06 | Disposition: A | Discharge status: Home / Self Care | Payer: Medicare Other | Source: Ambulatory Visit | Attending: Internal Medicine | Admitting: Internal Medicine

## 2015-08-06 DIAGNOSIS — K7689 Other specified diseases of liver: Secondary | ICD-10-CM

## 2015-08-06 MED ORDER — GADOBENATE DIMEGLUMINE 529 MG/ML IV SOLN
14.0000 mL | Freq: Once | INTRAVENOUS | Status: AC | PRN
  Administered 2015-08-06: 14 mL via INTRAVENOUS

## 2015-08-09 ENCOUNTER — Telehealth: Payer: Self-pay | Admitting: Internal Medicine

## 2015-08-09 DIAGNOSIS — M170 Bilateral primary osteoarthritis of knee: Secondary | ICD-10-CM

## 2015-08-09 NOTE — Telephone Encounter (Signed)
Relation to pt: self  Call back number:[phone]   Reason for call:  Patient requesting a referral to Dione Plover. Aluisio, MD, [facility]

## 2015-08-09 NOTE — Telephone Encounter (Signed)
OV notes from 07/27/2015 reviewed, Pt c/o bilateral knee pain, DJD. Referral placed.

## 2015-09-06 ENCOUNTER — Encounter: Payer: Self-pay | Admitting: Gynecology

## 2015-09-25 ENCOUNTER — Other Ambulatory Visit: Payer: Self-pay | Admitting: Gastroenterology

## 2015-10-08 ENCOUNTER — Other Ambulatory Visit: Payer: Self-pay | Admitting: Gastroenterology

## 2015-12-10 ENCOUNTER — Telehealth: Payer: Self-pay | Admitting: *Deleted

## 2015-12-10 ENCOUNTER — Encounter: Payer: Self-pay | Admitting: Gynecology

## 2015-12-10 ENCOUNTER — Ambulatory Visit (INDEPENDENT_AMBULATORY_CARE_PROVIDER_SITE_OTHER): Payer: Medicare Other | Admitting: Gynecology

## 2015-12-10 VITALS — BP 118/80 | Ht 64.0 in | Wt 155.0 lb

## 2015-12-10 DIAGNOSIS — Z8041 Family history of malignant neoplasm of ovary: Secondary | ICD-10-CM

## 2015-12-10 DIAGNOSIS — K469 Unspecified abdominal hernia without obstruction or gangrene: Secondary | ICD-10-CM | POA: Diagnosis not present

## 2015-12-10 DIAGNOSIS — N952 Postmenopausal atrophic vaginitis: Secondary | ICD-10-CM | POA: Diagnosis not present

## 2015-12-10 DIAGNOSIS — Z01419 Encounter for gynecological examination (general) (routine) without abnormal findings: Secondary | ICD-10-CM

## 2015-12-10 DIAGNOSIS — M8580 Other specified disorders of bone density and structure, unspecified site: Secondary | ICD-10-CM

## 2015-12-10 DIAGNOSIS — IMO0002 Reserved for concepts with insufficient information to code with codable children: Secondary | ICD-10-CM

## 2015-12-10 DIAGNOSIS — Z803 Family history of malignant neoplasm of breast: Secondary | ICD-10-CM

## 2015-12-10 NOTE — Telephone Encounter (Signed)
-----   Message from [name], MD sent at 12/10/2015  9:13 AM EST ----- Arrange oncology genetic counseling appointment at [place] reference strong family history of premenopausal breast cancer and ovarian cancer

## 2015-12-10 NOTE — Patient Instructions (Signed)
Office will call to arrange for genetic counseling in reference to your strong family history of breast and ovarian cancer. If you do not hear from them within 2 weeks call my office.  Follow up for bone density as scheduled  You may obtain a copy of any labs that were done today by logging onto MyChart as outlined in the instructions provided with your AVS (after visit summary). The office will not call with normal lab results but certainly if there are any significant abnormalities then we will contact you.   Health Maintenance Adopting a healthy lifestyle and getting preventive care can go a long way to promote health and wellness. Talk with your health care provider about what schedule of regular examinations is right for you. This is a good chance for you to check in with your provider about disease prevention and staying healthy. In between checkups, there are plenty of things you can do on your own. Experts have done a lot of research about which lifestyle changes and preventive measures are most likely to keep you healthy. Ask your health care provider for more information. WEIGHT AND DIET  Eat a healthy diet  Be sure to include plenty of vegetables, fruits, low-fat dairy products, and lean protein.  Do not eat a lot of foods high in solid fats, added sugars, or salt.  Get regular exercise. This is one of the most important things you can do for your health.  Most adults should exercise for at least 150 minutes each week. The exercise should increase your heart rate and make you sweat (moderate-intensity exercise).  Most adults should also do strengthening exercises at least twice a week. This is in addition to the moderate-intensity exercise.  Maintain a healthy weight  Body mass index (BMI) is a measurement that can be used to identify possible weight problems. It estimates body fat based on height and weight. Your health care provider can help determine your BMI and help you  achieve or maintain a healthy weight.  For females 66 years of age and older:   A BMI below 18.5 is considered underweight.  A BMI of 18.5 to 24.9 is normal.  A BMI of 25 to 29.9 is considered overweight.  A BMI of 30 and above is considered obese.  Watch levels of cholesterol and blood lipids  You should start having your blood tested for lipids and cholesterol at 68 years of age, then have this test every 5 years.  You may need to have your cholesterol levels checked more often if:  Your lipid or cholesterol levels are high.  You are older than 68 years of age.  You are at high risk for heart disease.  CANCER SCREENING   Lung Cancer  Lung cancer screening is recommended for adults 64-7 years old who are at high risk for lung cancer because of a history of smoking.  A yearly low-dose CT scan of the lungs is recommended for people who:  Currently smoke.  Have quit within the past 15 years.  Have at least a 30-pack-year history of smoking. A pack year is smoking an average of one pack of cigarettes a day for 1 year.  Yearly screening should continue until it has been 15 years since you quit.  Yearly screening should stop if you develop a health problem that would prevent you from having lung cancer treatment.  Breast Cancer  Practice breast self-awareness. This means understanding how your breasts normally appear and feel.  It also  means doing regular breast self-exams. Let your health care provider know about any changes, no matter how small.  If you are in your 20s or 30s, you should have a clinical breast exam (CBE) by a health care provider every 1-3 years as part of a regular health exam.  If you are 2 or older, have a CBE every year. Also consider having a breast X-ray (mammogram) every year.  If you have a family history of breast cancer, talk to your health care provider about genetic screening.  If you are at high risk for breast cancer, talk to your  health care provider about having an MRI and a mammogram every year.  Breast cancer gene (BRCA) assessment is recommended for women who have family members with BRCA-related cancers. BRCA-related cancers include:  Breast.  Ovarian.  Tubal.  Peritoneal cancers.  Results of the assessment will determine the need for genetic counseling and BRCA1 and BRCA2 testing. Cervical Cancer Routine pelvic examinations to screen for cervical cancer are no longer recommended for nonpregnant women who are considered low risk for cancer of the pelvic organs (ovaries, uterus, and vagina) and who do not have symptoms. A pelvic examination may be necessary if you have symptoms including those associated with pelvic infections. Ask your health care provider if a screening pelvic exam is right for you.   The Pap test is the screening test for cervical cancer for women who are considered at risk.  If you had a hysterectomy for a problem that was not cancer or a condition that could lead to cancer, then you no longer need Pap tests.  If you are older than 65 years, and you have had normal Pap tests for the past 10 years, you no longer need to have Pap tests.  If you have had past treatment for cervical cancer or a condition that could lead to cancer, you need Pap tests and screening for cancer for at least 20 years after your treatment.  If you no longer get a Pap test, assess your risk factors if they change (such as having a new sexual partner). This can affect whether you should start being screened again.  Some women have medical problems that increase their chance of getting cervical cancer. If this is the case for you, your health care provider may recommend more frequent screening and Pap tests.  The human papillomavirus (HPV) test is another test that may be used for cervical cancer screening. The HPV test looks for the virus that can cause cell changes in the cervix. The cells collected during the Pap  test can be tested for HPV.  The HPV test can be used to screen women 34 years of age and older. Getting tested for HPV can extend the interval between normal Pap tests from three to five years.  An HPV test also should be used to screen women of any age who have unclear Pap test results.  After 68 years of age, women should have HPV testing as often as Pap tests.  Colorectal Cancer  This type of cancer can be detected and often prevented.  Routine colorectal cancer screening usually begins at 68 years of age and continues through 68 years of age.  Your health care provider may recommend screening at an earlier age if you have risk factors for colon cancer.  Your health care provider may also recommend using home test kits to check for hidden blood in the stool.  A small camera at the end of  a tube can be used to examine your colon directly (sigmoidoscopy or colonoscopy). This is done to check for the earliest forms of colorectal cancer.  Routine screening usually begins at age 77.  Direct examination of the colon should be repeated every 5-10 years through 68 years of age. However, you may need to be screened more often if early forms of precancerous polyps or small growths are found. Skin Cancer  Check your skin from head to toe regularly.  Tell your health care provider about any new moles or changes in moles, especially if there is a change in a mole's shape or color.  Also tell your health care provider if you have a mole that is larger than the size of a pencil eraser.  Always use sunscreen. Apply sunscreen liberally and repeatedly throughout the day.  Protect yourself by wearing long sleeves, pants, a wide-brimmed hat, and sunglasses whenever you are outside. HEART DISEASE, DIABETES, AND HIGH BLOOD PRESSURE   Have your blood pressure checked at least every 1-2 years. High blood pressure causes heart disease and increases the risk of stroke.  If you are between 17 years  and 33 years old, ask your health care provider if you should take aspirin to prevent strokes.  Have regular diabetes screenings. This involves taking a blood sample to check your fasting blood sugar level.  If you are at a normal weight and have a low risk for diabetes, have this test once every three years after 68 years of age.  If you are overweight and have a high risk for diabetes, consider being tested at a younger age or more often. PREVENTING INFECTION  Hepatitis B  If you have a higher risk for hepatitis B, you should be screened for this virus. You are considered at high risk for hepatitis B if:  You were born in a country where hepatitis B is common. Ask your health care provider which countries are considered high risk.  Your parents were born in a high-risk country, and you have not been immunized against hepatitis B (hepatitis B vaccine).  You have HIV or AIDS.  You use needles to inject street drugs.  You live with someone who has hepatitis B.  You have had sex with someone who has hepatitis B.  You get hemodialysis treatment.  You take certain medicines for conditions, including cancer, organ transplantation, and autoimmune conditions. Hepatitis C  Blood testing is recommended for:  Everyone born from [birth year] through 1963.  Anyone with known risk factors for hepatitis C. Sexually transmitted infections (STIs)  You should be screened for sexually transmitted infections (STIs) including gonorrhea and chlamydia if:  You are sexually active and are younger than 68 years of age.  You are older than 68 years of age and your health care provider tells you that you are at risk for this type of infection.  Your sexual activity has changed since you were last screened and you are at an increased risk for chlamydia or gonorrhea. Ask your health care provider if you are at risk.  If you do not have HIV, but are at risk, it may be recommended that you take a prescription  medicine daily to prevent HIV infection. This is called pre-exposure prophylaxis (PrEP). You are considered at risk if:  You are sexually active and do not regularly use condoms or know the HIV status of your partner(s).  You take drugs by injection.  You are sexually active with a partner who has HIV. Talk  with your health care provider about whether you are at high risk of being infected with HIV. If you choose to begin PrEP, you should first be tested for HIV. You should then be tested every 3 months for as long as you are taking PrEP.  PREGNANCY   If you are premenopausal and you may become pregnant, ask your health care provider about preconception counseling.  If you may become pregnant, take 400 to 800 micrograms (mcg) of folic acid every day.  If you want to prevent pregnancy, talk to your health care provider about birth control (contraception). OSTEOPOROSIS AND MENOPAUSE   Osteoporosis is a disease in which the bones lose minerals and strength with aging. This can result in serious bone fractures. Your risk for osteoporosis can be identified using a bone density scan.  If you are 34 years of age or older, or if you are at risk for osteoporosis and fractures, ask your health care provider if you should be screened.  Ask your health care provider whether you should take a calcium or vitamin D supplement to lower your risk for osteoporosis.  Menopause may have certain physical symptoms and risks.  Hormone replacement therapy may reduce some of these symptoms and risks. Talk to your health care provider about whether hormone replacement therapy is right for you.  HOME CARE INSTRUCTIONS   Schedule regular health, dental, and eye exams.  Stay current with your immunizations.   Do not use any tobacco products including cigarettes, chewing tobacco, or electronic cigarettes.  If you are pregnant, do not drink alcohol.  If you are breastfeeding, limit how much and how often you  drink alcohol.  Limit alcohol intake to no more than 1 drink per day for nonpregnant women. One drink equals 12 ounces of beer, 5 ounces of wine, or 1 ounces of hard liquor.  Do not use street drugs.  Do not share needles.  Ask your health care provider for help if you need support or information about quitting drugs.  Tell your health care provider if you often feel depressed.  Tell your health care provider if you have ever been abused or do not feel safe at home. Document Released: 05/01/2011 Document Revised: 03/02/2014 Document Reviewed: 09/17/2013 [facility] Patient Information 2015 [place], [place]. This information is not intended to replace advice given to you by your health care provider. Make sure you discuss any questions you have with your health care provider.

## 2015-12-10 NOTE — Progress Notes (Signed)
DELANY BEHL Dec 11, 1945 [id number]        70 y.o.  [id number]  for breast and pelvic exam. Several issues noted below.  Past medical history,surgical history, problem list, medications, allergies, family history and social history were all reviewed and documented as reviewed in the EPIC chart.  ROS:  Performed with pertinent positives and negatives included in the history, assessment and plan.   Additional significant findings :  none   Exam: Leanne Lovely Vitals:   12/10/15 0850  BP: 118/80  Height: 5\' 4"  (1.626 m)  Weight: 155 lb (70.308 kg)   General appearance:  Normal affect, orientation and appearance. Skin: Grossly normal HEENT: Without gross lesions.  No cervical or supraclavicular adenopathy. Thyroid normal.  Lungs:  Clear without wheezing, rales or rhonchi Cardiac: RR, without RMG Abdominal:  Soft, nontender, without masses, guarding, rebound, organomegaly or hernia Breasts:  Examined lying and sitting without masses, retractions, discharge or axillary adenopathy. Pelvic:  Ext/BUS/vagina with atrophic changes. Small high enterocele/high cystocele noted  Adnexa without masses or tenderness    Anus and perineum normal   Rectovaginal normal sphincter tone without palpated masses or tenderness.    Assessment/Plan:  70 y.o. [id number] female for breast and pelvic exam.   1. Postmenopausal/atrophic genital changes. Status post [facility] 1991 for dysfunctional bleeding. Without significant hot flushes, night sweats, vaginal dryness. Continue monitoring report any issues. 2. Osteopenia. DEXA 01/2014 T score -1.5 FRAX 8.9%/1%. Schedule DEXA now at two-year interval after April. Increased calcium and vitamin D reviewed. 3. 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 Stable on exam. Asymptomatic to the patient. 4. Mammography 08/2015. Continue with annual mammography when due. SBE monthly reviewed. Patient with strong family history of breast cancer in sister age 62, paternal cousin  age 73 and great aunt at unknown age. Also ovarian cancer at age 81 in maternal aunt. Was to see the genetic counselors last year but she never followed up for the same she was overwhelmed with other things and never arranged. Again strongly recommended that she do so and she agrees. If she does not hear from their office within 2 weeks she knows to call my office in follow up. 5. Pap smear 2012. No Pap smear done today. No history of abnormal Pap smears. We both agree to stop screening per current screening guidelines based on age and hysterectomy history. 6. Colonoscopy 2016. Repeat at their recommended interval. 7. Health maintenance. No routine lab work done as patient reports this done elsewhere. Follow up for bone density and genetic counseling appointment as arranged. Follow up in one year, sooner if issues.   [name] MD, 9:10 AM 12/10/2015

## 2015-12-10 NOTE — Telephone Encounter (Signed)
Referral placed they will contact pt to schedule. 

## 2015-12-14 ENCOUNTER — Telehealth: Payer: Self-pay | Admitting: Genetic Counselor

## 2015-12-14 NOTE — Telephone Encounter (Signed)
PT CONFIRMED GENETIC COUNSELING APPT FOR 3/29

## 2015-12-15 NOTE — Telephone Encounter (Signed)
Appointment 01/26/16 @ 9:00am

## 2015-12-23 ENCOUNTER — Telehealth: Payer: Self-pay | Admitting: Internal Medicine

## 2015-12-23 NOTE — Telephone Encounter (Signed)
Called spoke with pt. She will give it another day and see if cipro helps. She will call back if she does not improve. Nothing further needed

## 2015-12-23 NOTE — Telephone Encounter (Signed)
cipro should cover this but she will know tomorrow if it is helpingn resp issues or not. If certainly not better by 12/24/15 AM we can look at change antibiotics to Rx bladder + chest and also consider steroids.  Can she wait and see til 12/24/15?

## 2015-12-23 NOTE — Telephone Encounter (Signed)
Per 07/23/15 OV: Glad you are doing well Call for any chest cold, coughing blood or any respiratory complaints that are worse or significant Otherwise, observation therapy only Flu shot 07/23/2015 Followup  9 months or sooner if needed --  C/o prod cough (brown phlem), chest cong, wheezing yesterday but denies any chest tightness, throat feels raw x yesterday. Not taking anything OTC. Pt is currently on cipro BID x 6 days for a baldder infection that started last night. No f/c/s/n/v.  Please advise MR thanks

## 2016-01-02 ENCOUNTER — Telehealth: Payer: Self-pay | Admitting: Pulmonary Disease

## 2016-01-02 MED ORDER — AZITHROMYCIN 250 MG PO TABS: ORAL_TABLET | ORAL | Status: AC

## 2016-01-02 NOTE — Telephone Encounter (Signed)
Pt called re: some hemoptysis. Chest cold is better but has some hemoptysis now. ZPAK worked in the past. She was requesting for zpak. Will call in zpak. Pt to call in 1-2 days if not better.

## 2016-01-03 NOTE — Telephone Encounter (Signed)
It appears that Zpak was sent and documented correctly.

## 2016-01-26 ENCOUNTER — Encounter: Payer: Self-pay | Admitting: Genetic Counselor

## 2016-01-26 ENCOUNTER — Other Ambulatory Visit: Payer: Medicare Other

## 2016-01-26 ENCOUNTER — Ambulatory Visit (HOSPITAL_BASED_OUTPATIENT_CLINIC_OR_DEPARTMENT_OTHER): Payer: Medicare Other | Admitting: Genetic Counselor

## 2016-01-26 DIAGNOSIS — D126 Benign neoplasm of colon, unspecified: Secondary | ICD-10-CM | POA: Diagnosis not present

## 2016-01-26 DIAGNOSIS — Z803 Family history of malignant neoplasm of breast: Secondary | ICD-10-CM | POA: Insufficient documentation

## 2016-01-26 DIAGNOSIS — Z800 Family history of malignant neoplasm of digestive organs: Secondary | ICD-10-CM | POA: Diagnosis not present

## 2016-01-26 DIAGNOSIS — E042 Nontoxic multinodular goiter: Secondary | ICD-10-CM | POA: Diagnosis not present

## 2016-01-26 NOTE — Progress Notes (Signed)
REFERRING PROVIDER: Colon Branch, MD [address] [place], [place] [address]  PRIMARY PROVIDER:  Kathlene November, MD  PRIMARY REASON FOR VISIT:  1. Benign neoplasm of colon, unspecified part of colon   2. GOITER, MULTINODULAR   3. Family history of breast cancer   4. Family history of colon cancer      HISTORY OF PRESENT ILLNESS:   Cynthia Thornton, a 70 y.o. female, was seen for a [facility] genetics consultation at the request of Dr. Larose Kells due to a family history of cancer.  Cynthia Thornton presents to clinic today to discuss the possibility of a hereditary predisposition to cancer, genetic testing, and to further clarify her future cancer risks, as well as potential cancer risks for family members. Cynthia Thornton is a 70 y.o. female with no personal history of cancer.  Her sister was diagnosed with breast cancer at age 43 but never had testing because her insurance would not cover it.  CANCER HISTORY:   No history exists.     HORMONAL RISK FACTORS:  Menarche was at age 5.  First live birth at age 54.  OCP use for approximately 0 years.  Ovaries intact: yes.  Hysterectomy: yes.  Menopausal status: postmenopausal.  HRT use: 1 years. Colonoscopy: yes; has had a total of 6-7 polyps. Mammogram within the last year: yes. Number of breast biopsies: 2. Up to date with pelvic exams:  yes. Any excessive radiation exposure in the past:  no  Past Medical History  Diagnosis Date  . Osteopenia 01/2014    T score -1.5 FRAX 8.9%/1.0%  . DUB (dysfunctional uterine bleeding)   . Arthritis   . Hiatal hernia 08/15/2006    EGD  . Esophageal reflux 08/15/2006    EGD(Chronic)  . Hyperthyroidism   . Esophageal stricture 08/15/2006    EGD  . Personal history of colonic polyps 09/06/2001    hyperplastic  . Pityriasis rosea   . 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 Primary osteoarthritis of left knee     Severe Patellofemoral arthritis  .  Family history of breast cancer   . Family history of colon cancer     Past Surgical History  Procedure Laterality Date  . Tonsillectomy    . Breast surgery  2000    Breast cyst removed, left  . Vaginal hysterectomy  1991    Right ovarian cystectomy  . Video bronchoscopy Bilateral 03/16/2015    Procedure: VIDEO BRONCHOSCOPY WITHOUT FLUORO;  Surgeon: Brand Males, MD;  Location: [facility] ENDOSCOPY;  Service: Endoscopy;  Laterality: Bilateral;    Social History   Social History  . Marital Status: Divorced    Spouse Name: N/A  . Number of Children: 2  . Years of Education: N/A   Occupational History  . Pharmacist, hospital, works part time  [employer] [place]  . Smoking status: Former Smoker -- 0.20 packs/day for 10 years    Types: Cigarettes    Quit date: 10/31/1995  . Smokeless tobacco: Never Used  . Alcohol Use: 0.0 oz/week    0 Standard drinks or equivalent per week     Comment: occ  . Drug Use: No  . Sexual Activity: Not Currently    Birth Control/ Protection: Surgical     Comment: 1st intercourse 70 yo-Fewer than 5 partners   Other Topics Concern  . None   Social History Narrative  Lives by herself   2 children, one has mental issues      FAMILY HISTORY:  We obtained a detailed, 4-generation family history.  Significant diagnoses are listed below: Family History  Problem Relation Age of Onset  . Osteoporosis Mother   . Lung cancer Mother   . Stroke Mother   . Diabetes Father   . Hypertension Father   . Osteoporosis Father   . Colon cancer Father 83  . Breast cancer Sister     Age 45  . Ovarian cancer Maternal Aunt 60  . Breast cancer Cousin     maternal-Age 24  . Breast cancer Maternal Aunt     great aunt- Age unknown  . Colon cancer Paternal Uncle 17  . Colon cancer Cousin     maternal first cousin  . Crohn's disease Son     The patient has two children, her son has Crohn's disease.  The patient has two sisters, one who had  breast cancer around age 10.  The patient's father had colon cancer at 68 and died soon afterward.  He had three brothers, one who also had colon cancer at 34 and died at 37.  There is no other reported history on this side of the family.  The patient's mother had lung cancer and died at 59.  She had three sisters and two brothers.  One sister had ovarian cancer at 53.  One sister had a son with colon cancer in his 55s.  One brother had a daughter with breast cancer at 34.  The patient's maternal grandfather had leukemia and died in his 72s. Her grandmother died of a stroke at 65.  The patient's grandmother had a sister with breast cancer dx in her 5s. Patient's maternal ancestors are of [place] descent, and paternal ancestors are of [place] descent. There is no reported Ashkenazi Jewish ancestry. There is no known consanguinity.  GENETIC COUNSELING ASSESSMENT: Cynthia Thornton is a 70 y.o. female with a family history of breast, ovarian and colon cancer which is somewhat suggestive of a hereditary cancer syndrome and predisposition to cancer. We, therefore, discussed and recommended the following at today's visit.   DISCUSSION: We discussed that about 5-10% of breast cancer is hereditary, with most cases due to BRCA mutations.  Other genes associated with hereditary breast cancer syndromes include PALB2, ATM and CHEK2. Ovarian cancer is seen to be hereditary in about 15-20% of cases.  AGain, most cases are due to BRCA mutations, but other times is is due to Lynch syndrome.  We reviewed the characteristics, features and inheritance patterns of hereditary cancer syndromes. We also discussed genetic testing, including the appropriate family members to test, the process of testing, insurance coverage and turn-around-time for results. We discussed the implications of a negative, positive and/or variant of uncertain significant result. We recommended Cynthia Thornton pursue genetic testing for the Comprehensive cancer gene  panel and MSH2 inversion.   Based on Cynthia Thornton's family history of cancer, she meets medical criteria for genetic testing. Despite that she meets criteria, she may still have an out of pocket cost. We discussed that if her out of pocket cost for testing is over $100, the laboratory will call and confirm whether she wants to proceed with testing.  If the out of pocket cost of testing is less than $100 she will be billed by the genetic testing laboratory.   PLAN: After considering the risks, benefits, and limitations, Cynthia Thornton  provided informed consent to pursue genetic  testing and the blood sample was sent to [facility] for analysis of the Comprehensive cancer panel. Results should be available within approximately 2-3 weeks' time, at which point they will be disclosed by telephone to Cynthia Thornton, as will any additional recommendations warranted by these results. Cynthia Thornton will receive a summary of her genetic counseling visit and a copy of her results once available. This information will also be available in Epic. We encouraged Cynthia Thornton to remain in contact with cancer genetics annually so that we can continuously update the family history and inform her of any changes in cancer genetics and testing that may be of benefit for her family. Cynthia Thornton questions were answered to her satisfaction today. Our contact information was provided should additional questions or concerns arise.  Based on Ms. Saenz's family history, we recommended her sister, who was diagnosed with breast cancer at age 40, have genetic counseling and testing. Ms. Eckmann will let us know if we can be of any assistance in coordinating genetic counseling and/or testing for this family member.   Lastly, we encouraged Ms. Urey to remain in contact with cancer genetics annually so that we can continuously update the family history and inform her of any changes in cancer genetics and testing that may be of benefit for this family.    Ms.  Sohail questions were answered to her satisfaction today. Our contact information was provided should additional questions or concerns arise. Thank you for the referral and allowing [place] to share in the care of your patient.   Decorey Wahlert P. Florene Glen, [place], [facility] Certified Genetic Counselor [email or web address] phone: [phone]  The patient was seen for a total of 60 minutes in face-to-face genetic counseling.  This patient was discussed with Drs. Magrinat, [name] and/or Burr Medico who agrees with the above.    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

## 2016-02-07 ENCOUNTER — Other Ambulatory Visit: Payer: Self-pay | Admitting: Gynecology

## 2016-02-07 DIAGNOSIS — M8580 Other specified disorders of bone density and structure, unspecified site: Secondary | ICD-10-CM

## 2016-02-07 DIAGNOSIS — Z13820 Encounter for screening for osteoporosis: Secondary | ICD-10-CM

## 2016-02-08 ENCOUNTER — Encounter: Payer: Self-pay | Admitting: Acute Care

## 2016-02-08 ENCOUNTER — Telehealth: Payer: Self-pay | Admitting: Internal Medicine

## 2016-02-08 ENCOUNTER — Ambulatory Visit (INDEPENDENT_AMBULATORY_CARE_PROVIDER_SITE_OTHER): Payer: Medicare Other | Admitting: Acute Care

## 2016-02-08 VITALS — BP 102/58 | HR 75 | Ht 64.0 in | Wt 151.0 lb

## 2016-02-08 DIAGNOSIS — J479 Bronchiectasis, uncomplicated: Secondary | ICD-10-CM | POA: Diagnosis not present

## 2016-02-08 MED ORDER — AZITHROMYCIN 250 MG PO TABS: ORAL_TABLET | ORAL | Status: DC

## 2016-02-08 NOTE — Patient Instructions (Addendum)
It is nice to meet you today We will call in a z pack for you today. If you develop a cough with significant amounts of blood go to the ER. Mucinex  DM 2 tablets twice daily take with a full glass of water. Follow up as needed. Follow up with Dr. Chase Caller as scheduled in June. Please contact office for sooner follow up if symptoms do not improve or worsen or seek emergency care

## 2016-02-08 NOTE — Progress Notes (Signed)
Subjective:    Patient ID: Cynthia Thornton, female    DOB: 05-08-1946, 70 y.o.   MRN: [id number]  HPI  70 year old female originally followed several years ago for chronic cough but most recently followed for  lingular bronchiectasis characterized by intermittent hemoptysis during exacerbations. Negative fiberoptic bronchoscopy mid 2016.  02/08/2016 Acute office visit: Patient presents to the office today with 24 hour history of increased in mucus production, with some dime size bright red blood noted in her sputum last night. She said it has self resolved. She states that this is the general course that occurs with her bronchiectasis. She denies fever, chest pain, orthopnea, or shortness of breath. No leg or calf pain, no recent air or automobile travel. Current outpatient prescriptions:  .  clorazepate (TRANXENE) 7.5 MG tablet, Take 1 tablet (7.5 mg total) by mouth daily as needed., Disp: 30 tablet, Rfl: 3 .  famotidine (PEPCID) 20 MG tablet, One at bedtime, Disp: 30 tablet, Rfl: 11 .  omeprazole (PRILOSEC) 40 MG capsule, TAKE 1 CAPSULE BY MOUTH DAILY, Disp: 90 capsule, Rfl: 3 .  azithromycin (ZITHROMAX) 250 MG tablet, Take as directed, Disp: 6 tablet, Rfl: 0 .  cholecalciferol (VITAMIN D) 1000 UNITS tablet, Take 1,000 Units by mouth daily. Reported on 02/08/2016, Disp: , Rfl:    Past Medical History  Diagnosis Date  . Osteopenia 01/2014    T score -1.5 FRAX 8.9%/1.0%  . DUB (dysfunctional uterine bleeding)   . Arthritis   . Hiatal hernia 08/15/2006    EGD  . Esophageal reflux 08/15/2006    EGD(Chronic)  . Hyperthyroidism   . Esophageal stricture 08/15/2006    EGD  . Personal history of colonic polyps 09/06/2001    hyperplastic  . Pityriasis rosea   . 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 Primary osteoarthritis of left knee     Severe Patellofemoral arthritis  . Family history of breast cancer   . Family history of colon cancer       Allergies  Allergen Reactions  . Codeine Nausea Only  . Propranolol Hcl Other (See Comments)    dizziness      Review of Systems Constitutional:   No  weight loss, night sweats,  Fevers, chills, fatigue, or  lassitude.  HEENT:   No headaches,  Difficulty swallowing,  Tooth/dental problems, or  Sore throat,                No sneezing, itching, ear ache, nasal congestion, post nasal drip,   CV:  No chest pain,  Orthopnea, PND, swelling in lower extremities, anasarca, dizziness, palpitations, syncope.   GI  No heartburn, indigestion, abdominal pain, nausea, vomiting, diarrhea, change in bowel habits, loss of appetite, bloody stools.   Resp: No shortness of breath with exertion or at rest.  + excess mucus, + productive cough,  No non-productive cough,  + coughing up of blood.  No change in color of mucus.  No wheezing.  No chest wall deformity  Skin: no rash or lesions.  GU: no dysuria, change in color of urine, no urgency or frequency.  No flank pain, no hematuria   MS:  No joint pain or swelling.  No decreased range of motion.  No back pain.  Psych:  No change in mood or affect. No depression or anxiety.  No memory loss.        Objective:   Physical Exam  BP 102/58 mmHg  Pulse  75  Ht 5\' 4"  (1.626 m)  Wt 151 lb (68.493 kg)  BMI 25.91 kg/m2  SpO2 98%  Physical Exam:  General- No distress,  A&Ox3 ENT: No sinus tenderness, TM clear, pale nasal mucosa, no oral exudate,no post nasal drip, no LAN Cardiac: S1, S2, regular rate and rhythm, no murmur Chest: No wheeze/ rales/ dullness; no accessory muscle use, no nasal flaring, no sternal retractions, + hemoptysis last PM has self resolved.( Dime sized amount) Abd.: Soft Non-tender Ext: No clubbing cyanosis, edema Neuro:  normal strength Skin: No rashes, warm and dry Psych: normal mood and behavior    Magdalen Spatz, AGACNP-BC [place] Pager # [phone] 02/08/2016 Assessment &  Plan:

## 2016-02-08 NOTE — Telephone Encounter (Signed)
Called and spoke with pt. She c/o prod cough with blood starting at night on 02/07/16. Denies any sinus congestion/drainage, chest congestion/tightness, SOB, wheezing, Fever, nausea or vomiting. She states that this cough occurs every 3-5 months. She states that usually she calls in and MR sends her for a chest xray and sends a zpak to the pharmacy. I explained to her that MR was out of the office today. I scheduled her with SG today at 4:30pm. She voiced understanding and had no further questions.

## 2016-02-08 NOTE — Assessment & Plan Note (Addendum)
Mild Flare with URI: Hemoptysis has self resolved, but we'll treat with antibiotic. Negative fiberoptic bronchoscopy in 2016. Plan: We will call in a z pack for you today. If you develop a cough with significant amounts of blood go to the ER. Mucinex  DM 2 tablets twice daily take with a full glass of water. Follow up as needed. Follow up with Dr. Chase Caller as scheduled in June. Please contact office for sooner follow up if symptoms do not improve or worsen or seek emergency care

## 2016-02-21 ENCOUNTER — Encounter: Payer: Self-pay | Admitting: Gastroenterology

## 2016-02-24 ENCOUNTER — Telehealth: Payer: Self-pay | Admitting: Genetic Counselor

## 2016-02-24 NOTE — Telephone Encounter (Signed)
LM on VM for patient to please call back.

## 2016-02-25 ENCOUNTER — Other Ambulatory Visit: Payer: Medicare Other

## 2016-02-28 DIAGNOSIS — M8580 Other specified disorders of bone density and structure, unspecified site: Secondary | ICD-10-CM

## 2016-02-28 HISTORY — DX: Other specified disorders of bone density and structure, unspecified site: M85.80

## 2016-03-03 ENCOUNTER — Ambulatory Visit: Payer: Self-pay | Admitting: Genetic Counselor

## 2016-03-03 ENCOUNTER — Telehealth: Payer: Self-pay | Admitting: Genetic Counselor

## 2016-03-03 ENCOUNTER — Encounter: Payer: Self-pay | Admitting: Genetic Counselor

## 2016-03-03 DIAGNOSIS — Z1379 Encounter for other screening for genetic and chromosomal anomalies: Secondary | ICD-10-CM

## 2016-03-03 DIAGNOSIS — Z800 Family history of malignant neoplasm of digestive organs: Secondary | ICD-10-CM

## 2016-03-03 DIAGNOSIS — Z803 Family history of malignant neoplasm of breast: Secondary | ICD-10-CM

## 2016-03-03 DIAGNOSIS — Z01810 Encounter for preprocedural cardiovascular examination: Secondary | ICD-10-CM | POA: Insufficient documentation

## 2016-03-03 HISTORY — DX: Encounter for other screening for genetic and chromosomal anomalies: Z13.79

## 2016-03-03 NOTE — Progress Notes (Signed)
HPI: Ms. Kampf was previously seen in the [facility] due to a family history of cancer and concerns regarding a hereditary predisposition to cancer. Please refer to our prior cancer genetics clinic note for more information regarding Ms. Shambley's medical, social and family histories, and our assessment and recommendations, at the time. Ms. Fullman recent genetic test results were disclosed to her, as were recommendations warranted by these results. These results and recommendations are discussed in more detail below.  FAMILY HISTORY:  We obtained a detailed, 4-generation family history.  Significant diagnoses are listed below: Family History  Problem Relation Age of Onset  . Osteoporosis Mother   . Lung cancer Mother   . Stroke Mother   . Diabetes Father   . Hypertension Father   . Osteoporosis Father   . Colon cancer Father 36  . Breast cancer Sister     Age 64  . Ovarian cancer Maternal Aunt 60  . Breast cancer Cousin     maternal-Age 19  . Breast cancer Maternal Aunt     great aunt- Age unknown  . Colon cancer Paternal Uncle 36  . Colon cancer Cousin     maternal first cousin  . Crohn's disease Son     The patient has two children, her son has Crohn's disease. The patient has two sisters, one who had breast cancer around age 55. The patient's father had colon cancer at 23 and died soon afterward. He had three brothers, one who also had colon cancer at 20 and died at 89. There is no other reported history on this side of the family. The patient's mother had lung cancer and died at 2. She had three sisters and two brothers. One sister had ovarian cancer at 96. One sister had a son with colon cancer in his 43s. One brother had a daughter with breast cancer at 61. The patient's maternal grandfather had leukemia and died in his 60s. Her grandmother died of a stroke at 33. The patient's grandmother had a sister with breast cancer dx in her 27s. Patient's  maternal ancestors are of [place] descent, and paternal ancestors are of [place] descent. There is no reported Ashkenazi Jewish ancestry. There is no known consanguinity.  GENETIC TEST RESULTS: At the time of Ms. Barradas's visit, we recommended she pursue genetic testing of the Comprehensive cancer gene panel and MSH2 Exon 1-7 Inversion Analysis. The Comprehensive Cancer Panel offered by [facility] includes sequencing and/or deletion duplication testing of the following 32 genes: APC, ATM, AXIN2, BARD1, BMPR1A, BRCA1, BRCA2, BRIP1, CDH1, CDK4, CDKN2A, CHEK2, EPCAM, FANCC, MLH1, MSH2, MSH6, MUTYH, NBN, PALB2, PMS2, POLD1, POLE, PTEN, RAD51C, RAD51D, SCG5/GREM1, SMAD4, STK11, TP53, VHL, and XRCC2. The report date is Mar 02, 2016.  Genetic testing was normal, and did not reveal a deleterious mutation in these genes. The test report has been scanned into EPIC and is located under the Molecular Pathology section of the Results Review tab.   We discussed with Ms. Cando that since the current genetic testing is not perfect, it is possible there may be a gene mutation in one of these genes that current testing cannot detect, but that chance is small. We also discussed, that it is possible that another gene that has not yet been discovered, or that we have not yet tested, is responsible for the cancer diagnoses in the family, and it is, therefore, important to remain in touch with cancer genetics in the future so that we can continue to offer  Ms. Taitt the most up to date genetic testing.   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 At this time, it is unknown if this variant is associated with increased cancer risk or if this is a normal finding, but most variants such as this get reclassified to being inconsequential. It should not be used to make medical management decisions. With time, we suspect the lab will determine the significance of this variant, if  any. If we do learn more about it, we will try to contact Ms. Wilson to discuss it further. However, it is important to stay in touch with [place] periodically and keep the address and phone number up to date.  CANCER SCREENING RECOMMENDATIONS: Given Ms. Sanzo's family histories, we must interpret these negative results with some caution.  Families with features suggestive of hereditary risk for cancer tend to have multiple family members with cancer, diagnoses in multiple generations and diagnoses before the age of 18. Ms. Broadus's family exhibits some of these features. Thus this result may simply reflect our current inability to detect all mutations within these genes or there may be a different gene that has not yet been discovered or tested. To better assess this risk, a family member who has had cancer should undergo genetic testing.  RECOMMENDATIONS FOR FAMILY MEMBERS: Women in this family might be at some increased risk of developing cancer, over the general population risk, simply due to the family history of cancer. We recommended women in this family have a yearly mammogram beginning at age 48, or 17 years younger than the earliest onset of cancer, an an annual clinical breast exam, and perform monthly breast self-exams. Women in this family should also have a gynecological exam as recommended by their primary provider. All family members should have a colonoscopy by age 58.  Based on Ms. Tatsch's family history, we recommended her sister, who was diagnosed with breast cancer at age 81, or her maternal cousin who was diagnosed with breast cancer at age 33, have genetic counseling and testing. Ms. Posa will let us know if we can be of any assistance in coordinating genetic counseling and/or testing for this family member.   FOLLOW-UP: Lastly, we discussed with Ms. Gebert that cancer genetics is a rapidly advancing field and it is possible that new genetic tests will be appropriate for her and/or her  family members in the future. We encouraged her to remain in contact with cancer genetics on an annual basis so we can update her personal and family histories and let her know of advances in cancer genetics that may benefit this family.   Our contact number was provided. Ms. Eggleton questions were answered to her satisfaction, and she knows she is welcome to call us at anytime with additional questions or concerns.   Roma Kayser, MS, [facility] Certified Genetic Counselor [email or web address]

## 2016-03-03 NOTE — Telephone Encounter (Signed)
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  Otherwise it is a negative test.  This VUS should not change medical management.

## 2016-03-04 ENCOUNTER — Encounter (HOSPITAL_COMMUNITY): Payer: Self-pay | Admitting: *Deleted

## 2016-03-04 ENCOUNTER — Emergency Department (HOSPITAL_COMMUNITY)
Admission: EM | Admit: 2016-03-04 | Discharge: 2016-03-04 | Disposition: A | Discharge status: Home / Self Care | Payer: Medicare Other | Attending: Emergency Medicine | Admitting: Emergency Medicine

## 2016-03-04 ENCOUNTER — Emergency Department (HOSPITAL_COMMUNITY): Payer: Medicare Other

## 2016-03-04 DIAGNOSIS — Z86010 Personal history of colonic polyps: Secondary | ICD-10-CM | POA: Insufficient documentation

## 2016-03-04 DIAGNOSIS — R945 Abnormal results of liver function studies: Secondary | ICD-10-CM

## 2016-03-04 DIAGNOSIS — K219 Gastro-esophageal reflux disease without esophagitis: Secondary | ICD-10-CM | POA: Diagnosis not present

## 2016-03-04 DIAGNOSIS — R079 Chest pain, unspecified: Secondary | ICD-10-CM

## 2016-03-04 DIAGNOSIS — Z872 Personal history of diseases of the skin and subcutaneous tissue: Secondary | ICD-10-CM | POA: Diagnosis not present

## 2016-03-04 DIAGNOSIS — Z87448 Personal history of other diseases of urinary system: Secondary | ICD-10-CM | POA: Diagnosis not present

## 2016-03-04 DIAGNOSIS — R1010 Upper abdominal pain, unspecified: Secondary | ICD-10-CM | POA: Diagnosis present

## 2016-03-04 DIAGNOSIS — R7989 Other specified abnormal findings of blood chemistry: Secondary | ICD-10-CM

## 2016-03-04 DIAGNOSIS — Z8739 Personal history of other diseases of the musculoskeletal system and connective tissue: Secondary | ICD-10-CM | POA: Insufficient documentation

## 2016-03-04 DIAGNOSIS — Z79899 Other long term (current) drug therapy: Secondary | ICD-10-CM | POA: Diagnosis not present

## 2016-03-04 DIAGNOSIS — Z8639 Personal history of other endocrine, nutritional and metabolic disease: Secondary | ICD-10-CM | POA: Diagnosis not present

## 2016-03-04 DIAGNOSIS — Z87891 Personal history of nicotine dependence: Secondary | ICD-10-CM | POA: Diagnosis not present

## 2016-03-04 LAB — I-STAT TROPONIN, ED
Troponin i, poc: 0 ng/mL (ref 0.00–0.08)
Troponin i, poc: 0 ng/mL (ref 0.00–0.08)

## 2016-03-04 LAB — COMPREHENSIVE METABOLIC PANEL
ALBUMIN: 3.8 g/dL (ref 3.5–5.0)
ALK PHOS: 101 U/L (ref 38–126)
ALT: 103 U/L — AB (ref 14–54)
ANION GAP: 11 (ref 5–15)
AST: 161 U/L — AB (ref 15–41)
BILIRUBIN TOTAL: 0.8 mg/dL (ref 0.3–1.2)
BUN: 22 mg/dL — AB (ref 6–20)
CALCIUM: 9.9 mg/dL (ref 8.9–10.3)
CO2: 26 mmol/L (ref 22–32)
CREATININE: 1.19 mg/dL — AB (ref 0.44–1.00)
Chloride: 101 mmol/L (ref 101–111)
GFR calc Af Amer: 54 mL/min — ABNORMAL LOW (ref >60)
GFR calc non Af Amer: 46 mL/min — ABNORMAL LOW (ref >60)
GLUCOSE: 122 mg/dL — AB (ref 65–99)
Potassium: 3.7 mmol/L (ref 3.5–5.1)
SODIUM: 138 mmol/L (ref 135–145)
TOTAL PROTEIN: 6.2 g/dL — AB (ref 6.5–8.1)

## 2016-03-04 LAB — CBC
HEMATOCRIT: 35.8 % — AB (ref 36.0–46.0)
Hemoglobin: 12 g/dL (ref 12.0–15.0)
MCH: 29.1 pg (ref 26.0–34.0)
MCHC: 33.5 g/dL (ref 30.0–36.0)
MCV: 86.9 fL (ref 78.0–100.0)
PLATELETS: 277 10*3/uL (ref 150–400)
RBC: 4.12 MIL/uL (ref 3.87–5.11)
RDW: 13.2 % (ref 11.5–15.5)
WBC: 5.8 10*3/uL (ref 4.0–10.5)

## 2016-03-04 LAB — LIPASE, BLOOD: Lipase: 29 U/L (ref 11–51)

## 2016-03-04 MED ORDER — OMEPRAZOLE 40 MG PO CPDR: 40.0000 mg | DELAYED_RELEASE_CAPSULE | Freq: Two times a day (BID) | ORAL | Status: DC

## 2016-03-04 MED ORDER — GI COCKTAIL ~~LOC~~
30.0000 mL | Freq: Once | ORAL | Status: AC
  Administered 2016-03-04: 30 mL via ORAL
  Filled 2016-03-04: qty 30

## 2016-03-04 MED ORDER — MORPHINE SULFATE (PF) 4 MG/ML IV SOLN
4.0000 mg | Freq: Once | INTRAVENOUS | Status: AC
  Administered 2016-03-04: 4 mg via INTRAVENOUS
  Filled 2016-03-04: qty 1

## 2016-03-04 NOTE — Discharge Instructions (Signed)
Gastroesophageal Reflux Disease, Adult Normally, food travels down the esophagus and stays in the stomach to be digested. However, when a person has gastroesophageal reflux disease (GERD), food and stomach acid move back up into the esophagus. When this happens, the esophagus becomes sore and inflamed. Over time, GERD can create small holes (ulcers) in the lining of the esophagus.  CAUSES This condition is caused by a problem with the muscle between the esophagus and the stomach (lower esophageal sphincter, or LES). Normally, the LES muscle closes after food passes through the esophagus to the stomach. When the LES is weakened or abnormal, it does not close properly, and that allows food and stomach acid to go back up into the esophagus. The LES can be weakened by certain dietary substances, medicines, and medical conditions, including:  Tobacco use.  Pregnancy.  Having a hiatal hernia.  Heavy alcohol use.  Certain foods and beverages, such as coffee, chocolate, onions, and peppermint. RISK FACTORS This condition is more likely to develop in:  People who have an increased body weight.  People who have connective tissue disorders.  People who use NSAID medicines. SYMPTOMS Symptoms of this condition include:  Heartburn.  Difficult or painful swallowing.  The feeling of having a lump in the throat.  Abitter taste in the mouth.  Bad breath.  Having a large amount of saliva.  Having an upset or bloated stomach.  Belching.  Chest pain.  Shortness of breath or wheezing.  Ongoing (chronic) cough or a night-time cough.  Wearing away of tooth enamel.  Weight loss. Different conditions can cause chest pain. Make sure to see your health care provider if you experience chest pain. DIAGNOSIS Your health care provider will take a medical history and perform a physical exam. To determine if you have mild or severe GERD, your health care provider may also monitor how you respond  to treatment. You may also have other tests, including:  An endoscopy toexamine your stomach and esophagus with a small camera.  A test thatmeasures the acidity level in your esophagus.  A test thatmeasures how much pressure is on your esophagus.  A barium swallow or modified barium swallow to show the shape, size, and functioning of your esophagus. TREATMENT The goal of treatment is to help relieve your symptoms and to prevent complications. Treatment for this condition may vary depending on how severe your symptoms are. Your health care provider may recommend:  Changes to your diet.  Medicine.  Surgery. HOME CARE INSTRUCTIONS Diet  Follow a diet as recommended by your health care provider. This may involve avoiding foods and drinks such as:  Coffee and tea (with or without caffeine).  Drinks that containalcohol.  Energy drinks and sports drinks.  Carbonated drinks or sodas.  Chocolate and cocoa.  Peppermint and mint flavorings.  Garlic and onions.  Horseradish.  Spicy and acidic foods, including peppers, chili powder, curry powder, vinegar, hot sauces, and barbecue sauce.  Citrus fruit juices and citrus fruits, such as oranges, lemons, and limes.  Tomato-based foods, such as red sauce, chili, salsa, and pizza with red sauce.  Fried and fatty foods, such as donuts, french fries, potato chips, and high-fat dressings.  High-fat meats, such as hot dogs and fatty cuts of red and [place] meats, such as rib eye steak, sausage, ham, and bacon.  High-fat dairy items, such as whole milk, butter, and cream cheese.  Eat small, frequent meals instead of large meals.  Avoid drinking large amounts of liquid with your  meals.  Avoid eating meals during the 2-3 hours before bedtime.  Avoid lying down right after you eat.  Do not exercise right after you eat. General Instructions  Pay attention to any changes in your symptoms.  Take over-the-counter and prescription  medicines only as told by your health care provider. Do not take aspirin, ibuprofen, or other NSAIDs unless your health care provider told you to do so.  Do not use any tobacco products, including cigarettes, chewing tobacco, and e-cigarettes. If you need help quitting, ask your health care provider.  Wear loose-fitting clothing. Do not wear anything tight around your waist that causes pressure on your abdomen.  Raise (elevate) the head of your bed 6 inches (15cm).  Try to reduce your stress, such as with yoga or meditation. If you need help reducing stress, ask your health care provider.  If you are overweight, reduce your weight to an amount that is healthy for you. Ask your health care provider for guidance about a safe weight loss goal.  Keep all follow-up visits as told by your health care provider. This is important. SEEK MEDICAL CARE IF:  You have new symptoms.  You have unexplained weight loss.  You have difficulty swallowing, or it hurts to swallow.  You have wheezing or a persistent cough.  Your symptoms do not improve with treatment.  You have a hoarse voice. SEEK IMMEDIATE MEDICAL CARE IF:  You have pain in your arms, neck, jaw, teeth, or back.  You feel sweaty, dizzy, or light-headed.  You have chest pain or shortness of breath.  You vomit and your vomit looks like blood or coffee grounds.  You faint.  Your stool is bloody or black.  You cannot swallow, drink, or eat.   This information is not intended to replace advice given to you by your health care provider. Make sure you discuss any questions you have with your health care provider.   Document Released: 07/26/2005 Document Revised: 07/07/2015 Document Reviewed: 02/10/2015 Elsevier Interactive Patient Education 2016 [place]. Liver Function Tests Liver function tests are blood tests to see how well your liver is working. The proteins and enzymes measured in the test can alert your health care  provider to inflammation, damage, or disease in your liver. It is common to have liver function tests:  During annual physical exams.  When you are taking certain medicines.  If you have liver disease.  If you drink a lot of alcohol.  When you are not feeling well.  When you have other conditions that may affect the liver. Substances measured may include:   Alanine transaminase (ALT). This is an enzyme in the liver.  Aspartate transaminase (AST). This is an enzyme in the liver, heart, and muscles.  Alkaline phosphatase (ALP). This is a protein in the liver, bile ducts, and bone. It is also in other body tissues.  Total bilirubin. This is a yellow pigment in bile.  Albumin. This is a protein in the liver.  Prothrombin time and international normalized ratio (PT and INR). PT measures the time that it takes for your blood to clot. INR is a calculation of blood clotting time based upon your PT result. It is also calculated based on normal ranges defined by the laboratory that processed your lab test.  Total protein. This measures two proteins, albumin and globulin, found in the blood. PREPARATION FOR TEST How you prepare will depend on which tests are being done and the reason why these tests are being done.  You may need to:  Avoid eating for 4-6 hours before the test or as directed by your health care provider.  Stop taking certain medicines prior to your blood test as directed by your health care provider. RESULTS  It is your responsibility to obtain your test results. Ask the lab or department performing the test when and how you will get your results. Contact your health care provider to discuss any questions you have about your results. RANGE OF NORMAL VALUES Ranges for normal values may vary among different labs and hospitals. You should always check with your health care provider after having lab work or other tests done to discuss the meaning of your test results and whether  your values are considered within normal limits. The following are normal ranges for substances measured in liver function tests: ALT  Infant: may be twice as high as adult values.  Child or adult: 4-36 international units/L at 37C or 4-36 units/L (SI units).  Elderly: may be slightly higher than adult values. AST  Newborn 23-73 days old: 35-140 units/L.  Child under 17 years old: 15-60 units/L.  98-12 years old: 15-50 units/L.  18-48 years old: 10-50 units/L.  3-45 years old: 10-40 units/L.  Adult: 0-35 units/L or 0-0.58 microkatal/L (SI units).  Elderly: slightly higher than adults. ALP  Child under 54 years old: 85-235 units/L.  16-55 years old: 65-210 units/L.  64-72 years old: 60-300 units/L.  43-70 years old: 30-200 units/L.  Adult: 30-120 units/L or 0.5-2.0 microkatal/L (SI units).  Elderly: slightly higher than adult. Total bilirubin  Newborn: 1.0-12.0 mg/dL or 17.1-205 micromoles/L (SI units).  Adult, elderly, or child: 0.3-1.0 mg/dL or 5.1-17 micromoles/L. Albumin  Premature infant: 3.0-4.2 g/dL.  Newborn: 3.5-5.4 g/dL.  Infant: 4.4-5.4 g/dL.  Child: 4.0-5.9 g/dL.  Adult or elderly: 3.5-5.0 g/dL or 35-50 g/L (SI units). PT  11.0-12.5 seconds; 85%-100%. INR  0.8-1.1. Total protein  Premature infant: 4.2-7.6 g/dL.  Newborn: 4.6-7.4 g/dL.  Infant: 6.0-6.7 g/dL.  Child: 6.2-8.0 g/dL.  Adult or elderly: 6.4-8.3 g/dL or 64-83 g/L (SI units). MEANING OF RESULTS OUTSIDE NORMAL VALUE RANGES Sometimes test results can be abnormal due to other factors, such as medicines, exercise, or pregnancy. Follow up with your health care provider if you have any questions about test results outside the normal value ranges. ALT  Levels above the normal range, along with other test results, may indicate liver disease. AST  Levels above the normal range, along with other test results, may indicate liver disease. Sometimes levels also increase after burns,  surgery, heart attack, muscle damage, or seizure. ALP  Levels above the normal range, along with other test results, may indicate biliary obstruction, diseases of the liver, bone disease, thyroid disease, tumors, fractures, leukemia or lymphoma, or several other conditions. People with blood type O or B may show higher levels after a fatty meal.  Levels below the normal range, along with other test results, may indicate bone and teeth conditions, malnutrition, protein deficiency, or Wilson disease. Total bilirubin  Levels above the normal range, along with other test results, may indicate problems with the liver, gallbladder, or bile ducts. Albumin  Levels above the normal range, along with other test results, may indicate dehydration. They may also be caused by a diet that is high in protein. Sometimes, the band placed around the upper arm during the process of drawing blood can cause the level of this protein in your blood to rise and give you a result above the normal range.  Levels below the normal range, along with other tests results, may indicate kidney disease, liver disease, or malabsorption of nutrients. PT and INR  Levels above the normal range mean your blood is clotting slower than normal. This may be due to blood disorders, liver disorders, or low levels of vitamin K. Total protein  Levels above the normal range, along with other test results, may be due to infection or other diseases.  Levels below the normal range, along with other test results, may be due to an immune system disorder, bleeding, burns, kidney disorder, liver disease, trouble absorbing or getting enough nutrients, or other conditions that affect the intestines.   This information is not intended to replace advice given to you by your health care provider. Make sure you discuss any questions you have with your health care provider.   Document Released: 11/18/2004 Document Revised: 11/06/2014 Document Reviewed:  02/19/2014 Elsevier Interactive Patient Education [employer].

## 2016-03-04 NOTE — ED Notes (Signed)
Pt to ED from home by EMS c/o centralized chest pain. Hx of indigestion and GERD, took 2 pepto bismol, pepcid and 81mg  ASA without improvement. Pt denies improvement of pain with belching. EMS gave nitro x 1 without relief of pain.

## 2016-03-04 NOTE — ED Provider Notes (Signed)
CSN: [id number]     Arrival date & time 03/04/16  0145 History  By signing my name below, I, Arianna Nassar, attest that this documentation has been prepared under the direction and in the presence of Jola Schmidt, MD. Electronically Signed: Julien Nordmann, ED Scribe. 03/04/2016. 2:49 AM.    No chief complaint on file.     The history is provided by the patient. No language interpreter was used.   HPI Comments: Cynthia Thornton is a 70 y.o. female brought in by ambulance, who has a PMHx of osteopenia, DUB, GERD, pityriasis rosea, and rhomboid pain presents to the Emergency Department complaining of constant, waxing and waning, gradual worsening, moderate, centralized chest pain onset 5 hours ago. Pt reports she ate dinner and went to a concert with a friend shortly after. Pt says she felt a sudden burning sensation in her chest that has not gone away. She reports taking two tums, two pepto bismal and 81 mg of aspirin to alleviate her symptoms with minimal temporary relief. Pt says she has a hx of GERD but this episode did not feel like her typical GERD symptoms. She denies shortness of breath.  Past Medical History  Diagnosis Date  . Osteopenia 01/2014    T score -1.5 FRAX 8.9%/1.0%  . DUB (dysfunctional uterine bleeding)   . Arthritis   . Hiatal hernia 08/15/2006    EGD  . Esophageal reflux 08/15/2006    EGD(Chronic)  . Hyperthyroidism   . Esophageal stricture 08/15/2006    EGD  . Personal history of colonic polyps 09/06/2001    hyperplastic  . Pityriasis rosea   . 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 Primary osteoarthritis of left knee     Severe Patellofemoral arthritis  . Family history of breast cancer   . Family history of colon cancer    Past Surgical History  Procedure Laterality Date  . Tonsillectomy    . Breast surgery  2000    Breast cyst removed, left  . Vaginal hysterectomy  1991    Right ovarian cystectomy  . Video  bronchoscopy Bilateral 03/16/2015    Procedure: VIDEO BRONCHOSCOPY WITHOUT FLUORO;  Surgeon: Brand Males, MD;  Location[facility] ENDOSCOPY;  Service: Endoscopy;  Laterality: Bilateral;   Family History  Problem Relation Age of Onset  . Osteoporosis Mother   . Lung cancer Mother   . Stroke Mother   . Diabetes Father   . Hypertension Father   . Osteoporosis Father   . Colon cancer Father 31  . Breast cancer Sister     Age 51  . Ovarian cancer Maternal Aunt 60  . Breast cancer Cousin     maternal-Age 5  . Breast cancer Maternal Aunt     great aunt- Age unknown  . Colon cancer Paternal Uncle 37  . Colon cancer Cousin     maternal first cousin  . Crohn's disease Son    Social History  Substance Use Topics  . Smoking status: Former Smoker -- 0.20 packs/day for 10 years    Types: Cigarettes    Quit date: 10/31/1995  . Smokeless tobacco: Never Used  . Alcohol Use: 0.0 oz/week    0 Standard drinks or equivalent per week     Comment: occ   OB History    Gravida Para Term Preterm AB TAB SAB Ectopic Multiple Living   3 2 2  1      2  Review of Systems  A complete 10 system review of systems was obtained and all systems are negative except as noted in the HPI and PMH.    Allergies  Codeine and Propranolol hcl  Home Medications   Prior to Admission medications   Medication Sig Start Date End Date Taking? Authorizing Provider  azithromycin (ZITHROMAX) 250 MG tablet Take as directed 02/08/16   Magdalen Spatz, NP  cholecalciferol (VITAMIN D) 1000 UNITS tablet Take 1,000 Units by mouth daily. Reported on 02/08/2016    Historical Provider, MD  clorazepate (TRANXENE) 7.5 MG tablet Take 1 tablet (7.5 mg total) by mouth daily as needed. 07/27/15   Colon Branch, MD  famotidine (PEPCID) 20 MG tablet One at bedtime 04/07/15   Jerene Bears, MD  omeprazole (PRILOSEC) 40 MG capsule TAKE 1 CAPSULE BY MOUTH DAILY 10/08/15   Jerene Bears, MD   Triage vitals: BP 104/77 mmHg  Pulse 78  Temp(Src)  97.6 F (36.4 C) (Oral)  Resp 16  Ht 5\' 4"  (1.626 m)  Wt 147 lb (66.679 kg)  BMI 25.22 kg/m2  SpO2 96% Physical Exam  Constitutional: She is oriented to person, place, and time. She appears well-developed and well-nourished. No distress.  HENT:  Head: Normocephalic and atraumatic.  Eyes: EOM are normal.  Neck: Normal range of motion.  Cardiovascular: Normal rate, regular rhythm and normal heart sounds.   Pulmonary/Chest: Effort normal and breath sounds normal.  Abdominal: Soft. She exhibits no distension. There is no tenderness.  Mild epigastric tenderness  Musculoskeletal: Normal range of motion.  Neurological: She is alert and oriented to person, place, and time.  Skin: Skin is warm and dry.  Psychiatric: She has a normal mood and affect. Judgment normal.  Nursing note and vitals reviewed.   ED Course  Procedures  DIAGNOSTIC STUDIES: Oxygen Saturation is 96% on RA, adequate by my interpretation.  COORDINATION OF CARE:  2:49 AM Discussed treatment plan with pt at bedside and pt agreed to plan.  Labs Review Labs Reviewed  CBC - Abnormal; Notable for the following:    HCT 35.8 (*)    All other components within normal limits  COMPREHENSIVE METABOLIC PANEL - Abnormal; Notable for the following:    Glucose, Bld 122 (*)    BUN 22 (*)    Creatinine, Ser 1.19 (*)    Total Protein 6.2 (*)    AST 161 (*)    ALT 103 (*)    GFR calc non Af Amer 46 (*)    GFR calc Af Amer 54 (*)    All other components within normal limits  LIPASE, BLOOD  I-STAT TROPOININ, ED  I-STAT TROPOININ, ED    Imaging Review Dg Chest 2 View  03/04/2016  CLINICAL DATA:  Chest pain since 22:30. EXAM: CHEST  2 VIEW COMPARISON:  04/23/2015 FINDINGS: There is mild hyperinflation. The lungs are clear. The pulmonary vasculature is normal. Heart size is normal. Hilar and mediastinal contours are unremarkable. There is no pleural effusion. IMPRESSION: Mild hyperinflation.  No acute cardiopulmonary findings.  Electronically Signed   By: Andreas Newport M.D.   On: 03/04/2016 02:37   US Abdomen Limited Ruq  03/04/2016  CLINICAL DATA:  Right upper quadrant pain with elevated liver function studies. EXAM: US ABDOMEN LIMITED - RIGHT UPPER QUADRANT COMPARISON:  MRI abdomen 08/06/2015.  CT abdomen 09/15/2014. FINDINGS: Gallbladder: No gallstones or wall thickening visualized. No sonographic Murphy sign noted by sonographer. Common bile duct: Diameter: 4.5 mm, normal Liver: Focal  liver lesions are demonstrated. In the left lobe of the liver, there is a septated cystic lesion measuring about 3.4 cm maximal diameter. In the right lobe of the liver, there is a circumscribed slightly hyperechoic lesion with mild increased through transmission suggesting hemangioma. Lesions were previously demonstrated at MRI in addition to the several other smaller lesions which we do not see on ultrasound. IMPRESSION: No evidence of cholelithiasis or cholecystitis. Multiple liver lesions as previously evaluated MRI. Appearance consistent with cysts and hemangiomas. Electronically Signed   By: Lucienne Capers M.D.   On: 03/04/2016 05:00   I have personally reviewed and evaluated these images and lab results as part of my medical decision-making.   EKG Interpretation   Date/Time:  Saturday Mar 04 2016 02:01:24 EDT Ventricular Rate:  78 PR Interval:    QRS Duration: 99 QT Interval:  362 QTC Calculation: 412 R Axis:   87 Text Interpretation:  Normal sinus rhythm Borderline right axis deviation  No old tracing to compare Confirmed by Noa Galvao  MD, Lennette Bihari ([id number]) on  03/04/2016 2:42:35 AM      MDM   Final diagnoses:  Upper abdominal pain  Chest pain, unspecified chest pain type  Gastroesophageal reflux disease, esophagitis presence not specified  Elevated liver function tests   5:20 AM  Well appearing. Feels much better. Dc home. Suspect GERD. Will change prilosec to BID. Doubt ACS.  [place] negative for stones. Mild elevation  in LFTs. Lipase normal.  Will need to follow up with pcp for recheck of her LFTs   I personally performed the services described in this documentation, which was scribed in my presence. The recorded information has been reviewed and is accurate.      Jola Schmidt, MD 03/04/16 0530

## 2016-03-13 ENCOUNTER — Ambulatory Visit (INDEPENDENT_AMBULATORY_CARE_PROVIDER_SITE_OTHER): Payer: Medicare Other

## 2016-03-13 ENCOUNTER — Other Ambulatory Visit: Payer: Self-pay | Admitting: Gynecology

## 2016-03-13 DIAGNOSIS — Z13820 Encounter for screening for osteoporosis: Secondary | ICD-10-CM

## 2016-03-13 DIAGNOSIS — M899 Disorder of bone, unspecified: Secondary | ICD-10-CM | POA: Diagnosis not present

## 2016-03-13 DIAGNOSIS — M8580 Other specified disorders of bone density and structure, unspecified site: Secondary | ICD-10-CM

## 2016-03-14 ENCOUNTER — Encounter: Payer: Self-pay | Admitting: Gynecology

## 2016-03-24 ENCOUNTER — Encounter (HOSPITAL_COMMUNITY): Payer: Self-pay

## 2016-03-29 ENCOUNTER — Other Ambulatory Visit: Payer: Self-pay | Admitting: *Deleted

## 2016-03-29 DIAGNOSIS — M8580 Other specified disorders of bone density and structure, unspecified site: Secondary | ICD-10-CM

## 2016-03-31 ENCOUNTER — Other Ambulatory Visit: Payer: Medicare Other

## 2016-03-31 DIAGNOSIS — M8580 Other specified disorders of bone density and structure, unspecified site: Secondary | ICD-10-CM

## 2016-04-01 LAB — VITAMIN D 25 HYDROXY (VIT D DEFICIENCY, FRACTURES): VIT D 25 HYDROXY: 29 ng/mL — AB (ref 30–100)

## 2016-04-03 ENCOUNTER — Other Ambulatory Visit: Payer: Self-pay | Admitting: Gynecology

## 2016-04-03 DIAGNOSIS — E559 Vitamin D deficiency, unspecified: Secondary | ICD-10-CM

## 2016-04-04 ENCOUNTER — Encounter (HOSPITAL_COMMUNITY): Payer: Self-pay

## 2016-04-07 ENCOUNTER — Ambulatory Visit: Payer: Medicare Other | Admitting: Internal Medicine

## 2016-04-16 ENCOUNTER — Emergency Department (HOSPITAL_COMMUNITY)
Admission: EM | Admit: 2016-04-16 | Discharge: 2016-04-16 | Disposition: A | Discharge status: Home / Self Care | Payer: Medicare Other | Attending: Emergency Medicine | Admitting: Emergency Medicine

## 2016-04-16 ENCOUNTER — Encounter (HOSPITAL_COMMUNITY): Payer: Self-pay | Admitting: Emergency Medicine

## 2016-04-16 ENCOUNTER — Emergency Department (HOSPITAL_COMMUNITY): Payer: Medicare Other

## 2016-04-16 DIAGNOSIS — M1712 Unilateral primary osteoarthritis, left knee: Secondary | ICD-10-CM | POA: Diagnosis not present

## 2016-04-16 DIAGNOSIS — M545 Low back pain: Secondary | ICD-10-CM | POA: Diagnosis present

## 2016-04-16 DIAGNOSIS — M5442 Lumbago with sciatica, left side: Secondary | ICD-10-CM

## 2016-04-16 DIAGNOSIS — W010XXA Fall on same level from slipping, tripping and stumbling without subsequent striking against object, initial encounter: Secondary | ICD-10-CM | POA: Diagnosis not present

## 2016-04-16 DIAGNOSIS — Z87891 Personal history of nicotine dependence: Secondary | ICD-10-CM | POA: Diagnosis not present

## 2016-04-16 DIAGNOSIS — M1990 Unspecified osteoarthritis, unspecified site: Secondary | ICD-10-CM | POA: Diagnosis not present

## 2016-04-16 DIAGNOSIS — W19XXXA Unspecified fall, initial encounter: Secondary | ICD-10-CM

## 2016-04-16 NOTE — ED Provider Notes (Signed)
CSN: [id number]     Arrival date & time 04/16/16  1511 History   First MD Initiated Contact with Patient 04/16/16 1605     Chief Complaint  Patient presents with  . Fall  . Back Pain     Patient is a 70 y.o. female presenting with fall and back pain. The history is provided by the patient.  Fall Pertinent negatives include no shortness of breath.  Back Pain Associated symptoms: no numbness and no weakness   Patient presents with fall and back pain. States she tripped and landed on her rear end straight down. States she has pain lower back since. She states she scraped her head a little bit but otherwise no complaints. No loss conscious. No numbness or weakness. Pain does go down the left leg. No loss of bladder bowel control.  Past Medical History  Diagnosis Date  . Osteopenia 02/2016    T score -1.9 FRAX 11%/1.7%  . DUB (dysfunctional uterine bleeding)   . Arthritis   . Hiatal hernia 08/15/2006    EGD  . Esophageal reflux 08/15/2006    EGD(Chronic)  . Hyperthyroidism   . Esophageal stricture 08/15/2006    EGD  . Personal history of colonic polyps 09/06/2001    hyperplastic  . Pityriasis rosea   . 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 Primary osteoarthritis of left knee     Severe Patellofemoral arthritis  . Family history of breast cancer   . Family history of colon cancer    Past Surgical History  Procedure Laterality Date  . Tonsillectomy    . Breast surgery  2000    Breast cyst removed, left  . Vaginal hysterectomy  1991    Right ovarian cystectomy  . Video bronchoscopy Bilateral 03/16/2015    Procedure: VIDEO BRONCHOSCOPY WITHOUT FLUORO;  Surgeon: Brand Males, MD;  Location: [facility] ENDOSCOPY;  Service: Endoscopy;  Laterality: Bilateral;   Family History  Problem Relation Age of Onset  . Osteoporosis Mother   . Lung cancer Mother   . Stroke Mother   . Diabetes Father   . Hypertension Father   . Osteoporosis  Father   . Colon cancer Father 66  . Breast cancer Sister     Age 29  . Ovarian cancer Maternal Aunt 60  . Breast cancer Cousin     maternal-Age 45  . Breast cancer Maternal Aunt     great aunt- Age unknown  . Colon cancer Paternal Uncle 34  . Colon cancer Cousin     maternal first cousin  . Crohn's disease Son    Social History  Substance Use Topics  . Smoking status: Former Smoker -- 0.20 packs/day for 10 years    Types: Cigarettes    Quit date: 10/31/1995  . Smokeless tobacco: Never Used  . Alcohol Use: 0.0 oz/week    0 Standard drinks or equivalent per week     Comment: occ   OB History    Gravida Para Term Preterm AB TAB SAB Ectopic Multiple Living   3 2 2  1     2      Review of Systems  Constitutional: Negative for activity change and appetite change.  Respiratory: Negative for chest tightness and shortness of breath.   Musculoskeletal: Positive for back pain. Negative for neck stiffness.  Neurological: Negative for weakness and numbness.  Psychiatric/Behavioral: Negative for behavioral problems.      Allergies  Codeine and Propranolol hcl  Home Medications   Prior to Admission medications   Medication Sig Start Date End Date Taking? Authorizing Provider  Cholecalciferol (VITAMIN D) 2000 units CAPS Take 2,000 Units by mouth daily.   Yes Historical Provider, MD  famotidine (PEPCID) 20 MG tablet One at bedtime Patient taking differently: Take 20 mg by mouth at bedtime.  04/07/15  Yes Jerene Bears, MD  omeprazole (PRILOSEC) 40 MG capsule Take 1 capsule (40 mg total) by mouth 2 (two) times daily. 03/04/16  Yes Jola Schmidt, MD   BP 116/67 mmHg  Pulse 68  Temp(Src) 98.9 F (37.2 C) (Oral)  Resp 20  SpO2 97% Physical Exam  Constitutional: She appears well-developed.  HENT:  Slight abrasion to left forehead.  Neck: Neck supple.  Cardiovascular: Normal rate.   Pulmonary/Chest: Effort normal.  Abdominal: Soft.  Musculoskeletal: She exhibits tenderness.   Tenderness to left sacral area. Pain with lowering left leg after straight leg raise. Neurovascular intact in left feet. No lumbar spine tenderness. No extremity tenderness.  Skin: Skin is warm.    ED Course  Procedures (including critical care time) Labs Review Labs Reviewed - No data to display  Imaging Review Dg Lumbar Spine Complete  04/16/2016  CLINICAL DATA:  [place] today.  Tail bone pain. EXAM: LUMBAR SPINE - COMPLETE 4+ VIEW COMPARISON:  CT abdomen and pelvis September 15, 2014 FINDINGS: Five non rib-bearing lumbar-type vertebral bodies are intact and aligned with maintenance of the lumbar lordosis. Mild broad lower lumbar levoscoliosis was present on prior CT Moderate to severe T10-11 and T11-12 disc height loss and endplate sclerosis/marginal spurring, which may be in part projectional. Mild lower lumbar facet arthropathy. No pars interarticularis defects. Lumbar intervertebral disc heights are normal. No destructive bony lesions. Sacroiliac joints are symmetric. Included prevertebral and paraspinal soft tissue planes are non-suspicious. IMPRESSION: Negative. Electronically Signed   By: Elon Alas M.D.   On: 04/16/2016 16:45   Dg Pelvis 1-2 Views  04/16/2016  CLINICAL DATA:  [place] today, tail bone pain. EXAM: SACRUM AND COCCYX - 2+ VIEW; PELVIS - 1-2 VIEW COMPARISON:  CT abdomen and pelvis September 15, 2014 FINDINGS: No acute fracture deformity or dislocation. Joint space intact without erosions. No destructive bony lesions. Soft tissue planes are not suspicious. IMPRESSION: Negative. Electronically Signed   By: Elon Alas M.D.   On: 04/16/2016 16:42   Dg Sacrum/coccyx  04/16/2016  CLINICAL DATA:  [place] today, tail bone pain. EXAM: SACRUM AND COCCYX - 2+ VIEW; PELVIS - 1-2 VIEW COMPARISON:  CT abdomen and pelvis September 15, 2014 FINDINGS: No acute fracture deformity or dislocation. Joint space intact without erosions. No destructive bony lesions.  Soft tissue planes are not suspicious. IMPRESSION: Negative. Electronically Signed   By: Elon Alas M.D.   On: 04/16/2016 16:42   I have personally reviewed and evaluated these images and lab results as part of my medical decision-making.   EKG Interpretation None      MDM   Final diagnoses:  Fall, initial encounter  Left-sided low back pain with left-sided sciatica    Patient with fall. Back pain. Negative x-rays. No red flags. Does not want stronger pain meds or muscle relaxers. Will discharge home.    Davonna Belling, MD 04/16/16 [phone]

## 2016-04-16 NOTE — Discharge Instructions (Signed)
Follow-up as needed. Return for  Increased pain or numbness or weakness.

## 2016-04-16 NOTE — ED Notes (Signed)
Pt c/o lower back and tailbone pain after fall this morning. Denies head injury and LOC.

## 2016-04-16 NOTE — ED Provider Notes (Signed)
4:30 PM Patient suffers from chronic low back pain. She reports that occasionally "my knees buckle". She follows morning. Causing exacerbation of chronic low back pain. She denies loss of consciousness denies loss of bladder or bowel control. Feels much improved after treatment with Percocet here. On exam alert no distress. Spine is nontender.. Gait slightly unsteady but walks without assistance. Patient feels much improved and is ready to go home  x-rays viewed by me  Orlie Dakin, MD 04/16/16 [phone]

## 2016-04-20 ENCOUNTER — Telehealth: Payer: Self-pay | Admitting: Internal Medicine

## 2016-04-20 MED ORDER — AZITHROMYCIN 250 MG PO TABS: ORAL_TABLET | ORAL | Status: DC

## 2016-04-20 NOTE — Telephone Encounter (Signed)
Patient started coughing up blood again today.  She said that Dr. Chase Caller advised her if this happened again that she needs to call to get a Zpak.  Patient is calling back to get Zpak.   MR - ok to send in [place]? Please advise.

## 2016-04-20 NOTE — Telephone Encounter (Signed)
Rx sent to pharmacy. Patient notified. Nothing further needed.  

## 2016-04-20 NOTE — Telephone Encounter (Signed)
Yes ok to send zpak

## 2016-04-24 ENCOUNTER — Other Ambulatory Visit: Payer: Self-pay | Admitting: Internal Medicine

## 2016-05-02 ENCOUNTER — Other Ambulatory Visit: Payer: Self-pay | Admitting: Internal Medicine

## 2016-05-17 ENCOUNTER — Encounter: Payer: Self-pay | Admitting: Internal Medicine

## 2016-05-17 ENCOUNTER — Other Ambulatory Visit: Payer: Medicare Other

## 2016-05-17 ENCOUNTER — Ambulatory Visit (INDEPENDENT_AMBULATORY_CARE_PROVIDER_SITE_OTHER): Payer: Medicare Other | Admitting: Internal Medicine

## 2016-05-17 VITALS — BP 116/72 | HR 69 | Ht 64.0 in | Wt 149.6 lb

## 2016-05-17 DIAGNOSIS — J479 Bronchiectasis, uncomplicated: Secondary | ICD-10-CM | POA: Diagnosis not present

## 2016-05-17 DIAGNOSIS — R042 Hemoptysis: Secondary | ICD-10-CM

## 2016-05-17 NOTE — Progress Notes (Signed)
Subjective:     Patient ID: Cynthia Thornton, female   DOB: 09-18-1946, 70 y.o.   MRN: [id number]  HPI   OV 07/23/2015  Chief Complaint  Patient presents with  . Follow-up    Pt states her breathing is doing well. Pt denies hemoptysis since seeing TP in 03/2015. Pt c/o mild prod cough with Johannes mucus. Pt denies CP/tightness.     70 year old female follow-up for originally followed several years ago for chronic cough but most recently lingular bronchiectasis characterized by intermittent hemoptysis during exacerbations. Negative fiberoptic bronchoscopy mid 2016  Reports no hemoptysis in several months. No shortness of breath. Has mild baseline occasional cough that is completely tolerable for which she does not feel her quality of life is impacted or wants any specific therapy. There are no new issues. She is only worried about recurrence of hemoptysis that Happens every 5 months but otherwise she is good. She will have flu shot today   02/08/2016 Acute office visit: Patient presents to the office today with 24 hour history of increased in mucus production, with some dime size bright red blood noted in her sputum last night. She said it has self resolved. She states that this is the general course that occurs with her bronchiectasis. She denies fever, chest pain, orthopnea, or shortness of breath. No leg or calf pain, no recent air or automobile travel.   OV 05/17/2016  Chief Complaint  Patient presents with  . Follow-up    pt has had 2 episodes of hemoptysis in the past 4 months.  Also has intermittent prod cough with thick, foul tasting mucus.     Follow-up lingular bronchiectasis asymptomatic state with occasional recurrent hemoptysis. This is normally responsive to Z-Pak. Negative fiberoptic bronchoscopy mid 2016  Last seen by myself September 2016 in my nurse practitioner April 2017. Since last seeing me in September 2016 she's had at least 4 episodes of hemoptysis. These occur randomly  and out of the blue. She is otherwise asymptomatic and she does not feel like a cold coming on. She has no infectious symptoms. This no cough or wheezing. She does coughs up dime size hemoptysis. She calls in for a Z-Pak and this helps. One time she even tried ciprofloxacin with Z-Pak always works better. She is not interested in daily azithromycin but is worried about the slightly higher frequency of recurrent hemoptysis then previous. Last CT scan of the chest was in spring 2016. Reviewed labs noticed that she will be in need of autoimmune antibodies and immunoglobulin workup. There is no weight loss or any other symptoms.      has a past medical history of Osteopenia (02/2016); DUB (dysfunctional uterine bleeding); Arthritis; Hiatal hernia (08/15/2006); Esophageal reflux (08/15/2006); Hyperthyroidism; Esophageal stricture (08/15/2006); Personal history of colonic polyps (09/06/2001); Pityriasis rosea; Rhomboid pain; Primary osteoarthritis of left knee; Family history of breast cancer; and Family history of colon cancer.   reports that she quit smoking about 20 years ago. Her smoking use included Cigarettes. She has a 2 pack-year smoking history. She has never used smokeless tobacco.  Past Surgical History  Procedure Laterality Date  . Tonsillectomy    . Breast surgery  2000    Breast cyst removed, left  . Vaginal hysterectomy  1991    Right ovarian cystectomy  . Video bronchoscopy Bilateral 03/16/2015    Procedure: VIDEO BRONCHOSCOPY WITHOUT FLUORO;  Surgeon: Brand Males, MD;  Location: [facility] ENDOSCOPY;  Service: Endoscopy;  Laterality: Bilateral;    Allergies  Allergen  Reactions  . Codeine Nausea Only  . Propranolol Hcl Other (See Comments)    dizziness    Immunization History  Administered Date(s) Administered  . Influenza Split 08/31/2011, 07/30/2014  . Influenza Whole 08/30/2009, 07/30/2010  . Influenza,inj,Quad PF,36+ Mos 09/30/2012, 10/10/2013, 07/26/2015  . Pneumococcal  Conjugate-13 10/10/2013  . Pneumococcal Polysaccharide-23 07/27/2015  . Td 12/02/2008  . Zoster 12/02/2008    Family History  Problem Relation Age of Onset  . Osteoporosis Mother   . Lung cancer Mother   . Stroke Mother   . Diabetes Father   . Hypertension Father   . Osteoporosis Father   . Colon cancer Father 57  . Breast cancer Sister     Age 6  . Ovarian cancer Maternal Aunt 60  . Breast cancer Cousin     maternal-Age 38  . Breast cancer Maternal Aunt     great aunt- Age unknown  . Colon cancer Paternal Uncle 55  . Colon cancer Cousin     maternal first cousin  . Crohn's disease Son      Current outpatient prescriptions:  .  Cholecalciferol (VITAMIN D) 2000 units CAPS, Take 2,000 Units by mouth daily., Disp: , Rfl:  .  famotidine (PEPCID) 20 MG tablet, One at bedtime (Patient taking differently: Take 20 mg by mouth at bedtime. ), Disp: 30 tablet, Rfl: 11 .  omeprazole (PRILOSEC) 40 MG capsule, Take 1 capsule (40 mg total) by mouth 2 (two) times daily., Disp: 30 capsule, Rfl: 0    Review of Systems     Objective:   Physical Exam  Constitutional: She is oriented to person, place, and time. She appears well-developed and well-nourished. No distress.  HENT:  Head: Normocephalic and atraumatic.  Right Ear: External ear normal.  Left Ear: External ear normal.  Mouth/Throat: Oropharynx is clear and moist. No oropharyngeal exudate.  Eyes: Conjunctivae and EOM are normal. Pupils are equal, round, and reactive to light. Right eye exhibits no discharge. Left eye exhibits no discharge. No scleral icterus.  Neck: Normal range of motion. Neck supple. No JVD present. No tracheal deviation present. No thyromegaly present.  Cardiovascular: Normal rate, regular rhythm, normal heart sounds and intact distal pulses.  Exam reveals no gallop and no friction rub.   No murmur heard. Pulmonary/Chest: Effort normal and breath sounds normal. No respiratory distress. She has no wheezes.  She has no rales. She exhibits no tenderness.  Abdominal: Soft. Bowel sounds are normal. She exhibits no distension and no mass. There is no tenderness. There is no rebound and no guarding.  Musculoskeletal: Normal range of motion. She exhibits no edema or tenderness.  Lymphadenopathy:    She has no cervical adenopathy.  Neurological: She is alert and oriented to person, place, and time. She has normal reflexes. No cranial nerve deficit. She exhibits normal muscle tone. Coordination normal.  Skin: Skin is warm and dry. No rash noted. She is not diaphoretic. No erythema. No pallor.  Psychiatric: She has a normal mood and affect. Her behavior is normal. Judgment and thought content normal.  Vitals reviewed.   Filed Vitals:   05/17/16 1531  BP: 116/72  Pulse: 69  Height: 5\' 4"  (1.626 m)  Weight: 149 lb 9.6 oz (67.858 kg)  SpO2: 97%        Assessment:       ICD-9-CM ICD-10-CM   1. Bronchiectasis without complication (HCC) A999333 J47.9   2. Hemoptysis 786.30 R04.2        Plan:  Unclear why recurrent hemoptysis   PLAN Restage bronchiectasis - do CT chest wo contrast Do ANA, RF, CCP, ssA, ssb autoimmune blood test for bronchiectasis Do IgG, IgM blood test for bronchiectassis  - will call with results to decide on daily inhaled steroids v [facility] 2nd opinioon - agree daily zpak too aggressive as of now  Followup - 3 months or sooner if needed    Dr. Brand Males, M.D., [facility].C.P Pulmonary and Critical Care Medicine Staff Physician [place] [facility] Pager: [phone], If no answer or between  15:00h - 7:00h: call [phone]  05/17/2016 3:56 PM

## 2016-05-17 NOTE — Patient Instructions (Addendum)
ICD-9-CM ICD-10-CM   1. Bronchiectasis without complication (HCC) A999333 J47.9   2. Hemoptysis 786.30 R04.2     Unclear why recurrent hemoptysis   PLAN Restage bronchiectasis - do CT chest wo contrast Do ANA, RF, CCP, ssA, ssb autoimmune blood test for bronchiectasis Do IgG, IgM blood test for bronchiectassis  - will call with results to decide on daily inhaled steroids v [facility] 2nd opinioon - agree daily zpak too aggressive as of now  Followup - 3 months or sooner if needed

## 2016-05-18 ENCOUNTER — Telehealth: Payer: Self-pay | Admitting: Internal Medicine

## 2016-05-18 ENCOUNTER — Ambulatory Visit: Payer: Medicare Other | Admitting: Internal Medicine

## 2016-05-18 LAB — CYCLIC CITRUL PEPTIDE ANTIBODY, IGG: Cyclic Citrullin Peptide Ab: 16 Units

## 2016-05-18 LAB — RHEUMATOID FACTOR: Rhuematoid fact SerPl-aCnc: <10 IU/mL (ref <=14)

## 2016-05-18 LAB — IGG: IgG (Immunoglobin G), Serum: 774 mg/dL (ref 694–1618)

## 2016-05-18 LAB — ANA: ANA: NEGATIVE

## 2016-05-18 LAB — IGM: IgM, Serum: 94 mg/dL (ref 48–271)

## 2016-05-18 LAB — SJOGRENS SYNDROME-B EXTRACTABLE NUCLEAR ANTIBODY: SSB (La) (ENA) Antibody, IgG: <1

## 2016-05-18 LAB — SJOGRENS SYNDROME-A EXTRACTABLE NUCLEAR ANTIBODY: SSA (Ro) (ENA) Antibody, IgG: <1

## 2016-05-18 NOTE — Telephone Encounter (Signed)
Let Cynthia Thornton know that additional workup blood for bronchiectasis is normal. Will await ct chest 7/27 before deciding on ICS v [facility]

## 2016-05-18 NOTE — Telephone Encounter (Signed)
lmtcb X1 for pt to relay results/recs.  

## 2016-05-24 NOTE — Telephone Encounter (Signed)
LMTCB x 1 

## 2016-05-24 NOTE — Telephone Encounter (Signed)
Pt returning [place] call.

## 2016-05-25 ENCOUNTER — Ambulatory Visit (INDEPENDENT_AMBULATORY_CARE_PROVIDER_SITE_OTHER)
Admission: RE | Admit: 2016-05-25 | Discharge: 2016-05-25 | Disposition: A | Discharge status: Home / Self Care | Payer: Medicare Other | Source: Ambulatory Visit | Attending: Internal Medicine | Admitting: Internal Medicine

## 2016-05-25 DIAGNOSIS — R042 Hemoptysis: Secondary | ICD-10-CM

## 2016-05-25 DIAGNOSIS — J479 Bronchiectasis, uncomplicated: Secondary | ICD-10-CM | POA: Diagnosis not present

## 2016-05-25 NOTE — Telephone Encounter (Signed)
pls call patient back on cell. [phone]

## 2016-05-25 NOTE — Telephone Encounter (Signed)
lmtcb for pt.  

## 2016-05-26 ENCOUNTER — Telehealth: Payer: Self-pay | Admitting: Internal Medicine

## 2016-05-26 NOTE — Telephone Encounter (Signed)
LMTCB

## 2016-05-26 NOTE — Telephone Encounter (Signed)
Ct shows mild bronchiectasis  - RML and LML - I will discus with her a fu THis is what is making her cough up blood. Nothing else new on CT

## 2016-05-29 NOTE — Telephone Encounter (Signed)
Patient returned call, please call [phone].

## 2016-05-29 NOTE — Telephone Encounter (Signed)
Spoke with pt. She is aware of results. Nothing further was needed.  

## 2016-05-29 NOTE — Telephone Encounter (Signed)
Pt aware of recs.

## 2016-05-31 ENCOUNTER — Ambulatory Visit: Payer: Medicare Other | Admitting: Internal Medicine

## 2016-08-09 ENCOUNTER — Ambulatory Visit (INDEPENDENT_AMBULATORY_CARE_PROVIDER_SITE_OTHER): Payer: Medicare Other | Admitting: Internal Medicine

## 2016-08-09 ENCOUNTER — Encounter (INDEPENDENT_AMBULATORY_CARE_PROVIDER_SITE_OTHER): Payer: Self-pay

## 2016-08-09 ENCOUNTER — Other Ambulatory Visit (INDEPENDENT_AMBULATORY_CARE_PROVIDER_SITE_OTHER): Payer: Medicare Other

## 2016-08-09 ENCOUNTER — Encounter: Payer: Self-pay | Admitting: Internal Medicine

## 2016-08-09 VITALS — BP 100/70 | HR 64 | Ht 64.0 in | Wt 153.0 lb

## 2016-08-09 DIAGNOSIS — K219 Gastro-esophageal reflux disease without esophagitis: Secondary | ICD-10-CM

## 2016-08-09 DIAGNOSIS — Z86010 Personal history of colonic polyps: Secondary | ICD-10-CM

## 2016-08-09 DIAGNOSIS — R748 Abnormal levels of other serum enzymes: Secondary | ICD-10-CM | POA: Diagnosis not present

## 2016-08-09 LAB — COMPREHENSIVE METABOLIC PANEL
ALT: 13 U/L (ref 0–35)
AST: 16 U/L (ref 0–37)
Albumin: 4.5 g/dL (ref 3.5–5.2)
Alkaline Phosphatase: 94 U/L (ref 39–117)
BUN: 12 mg/dL (ref 6–23)
CALCIUM: 9.5 mg/dL (ref 8.4–10.5)
CHLORIDE: 98 meq/L (ref 96–112)
CO2: 28 meq/L (ref 19–32)
Creatinine, Ser: 0.91 mg/dL (ref 0.40–1.20)
GFR: 65.27 mL/min (ref >60.00)
Glucose, Bld: 91 mg/dL (ref 70–99)
Potassium: 4.3 mEq/L (ref 3.5–5.1)
Sodium: 134 mEq/L — ABNORMAL LOW (ref 135–145)
TOTAL PROTEIN: 7.1 g/dL (ref 6.0–8.3)
Total Bilirubin: 0.5 mg/dL (ref 0.2–1.2)

## 2016-08-09 MED ORDER — FAMOTIDINE 20 MG PO TABS: ORAL_TABLET | ORAL | 3 refills | Status: DC

## 2016-08-09 MED ORDER — OMEPRAZOLE 40 MG PO CPDR: 40.0000 mg | DELAYED_RELEASE_CAPSULE | ORAL | 3 refills | Status: DC

## 2016-08-09 NOTE — Patient Instructions (Signed)
We have sent the following medications to your pharmacy for you to pick up at your convenience: CMP  We have sent the following medications to your pharmacy for you to pick up at your convenience: Omeprazole 40 mg every morning Pepcid 20 mg every evening as needed  If you are age 68 or older, your body mass index should be between 23-30. Your Body mass index is 26.26 kg/m. If this is out of the aforementioned range listed, please consider follow up with your Primary Care Provider.  If you are age 18 or younger, your body mass index should be between 19-25. Your Body mass index is 26.26 kg/m. If this is out of the aformentioned range listed, please consider follow up with your Primary Care Provider.

## 2016-08-10 NOTE — Progress Notes (Signed)
Patient ID: Cynthia Thornton, female   DOB: 05/19/1946, 70 y.o.   MRN: [id number]

## 2016-08-10 NOTE — Progress Notes (Signed)
Subjective:    Patient ID: Cynthia Thornton, female    DOB: 1945-12-30, 70 y.o.   MRN: [id number]  HPI Cynthia Thornton is a 70 year old female with a past medical history of GERD, family history of colon cancer, personal history of sessile serrated adenoma, who is seen in follow-up. She repeat reports that she has been doing very well. She has continued omeprazole 40 mg daily. She is tried stopping this medication for a period of time but her reflux returned necessitating her to resume therapy. She has used Pepcid on occasion for breakthrough heartburn but recently this is been quite rare. She denies dysphagia or odynophagia. Bowel movements a been regular though occasionally she feels some fleeting left-sided pain. She feels like this relates to eating spicy food and eating nuts. No pain today. No blood in her stool or melena.  On review of the records she had elevated liver enzymes with AST 161, ALT 103, normal bili and alkaline phosphatase in May 2017. This was evaluated by ultrasound which showed no evidence of cholelithiasis or cholecystitis. There are multiple liver lesions previously evaluated by MRI which appear consistent with cysts and hemangiomas. The patient hypothesized that this elevation was related to a medication she took in the spring for facial dermatitis. It was prescribed by her dermatologist. She is now off of this medicine can recall exactly what it was  Review of Systems As per history of present illness, otherwise negative  Current Medications, Allergies, Past Medical History, Past Surgical History, Family History and Social History were reviewed in [employer] record.     Objective:   Physical Exam BP 100/70 (BP Location: Right Arm, Patient Position: Sitting, Cuff Size: Normal)   Pulse 64   Ht 5\' 4"  (1.626 m)   Wt 153 lb (69.4 kg)   BMI 26.26 kg/m  Constitutional: Well-developed and well-nourished. No distress. HEENT: Normocephalic and atraumatic.  Oropharynx is clear and moist. No oropharyngeal exudate. Conjunctivae are normal.  No scleral icterus. Neck: Neck supple. Trachea midline. Cardiovascular: Normal rate, regular rhythm and intact distal pulses. No M/R/G Pulmonary/chest: Effort normal and breath sounds normal. No wheezing, rales or rhonchi. Abdominal: Soft, nontender, nondistended. Bowel sounds active throughout. There are no masses palpable. No hepatosplenomegaly. Extremities: no clubbing, cyanosis, or edema Lymphadenopathy: No cervical adenopathy noted. Neurological: Alert and oriented to person place and time. Skin: Skin is warm and dry. No rashes noted. Psychiatric: Normal mood and affect. Behavior is normal.  CMP     Component Value Date/Time   NA 134 (L) 08/09/2016 1549   K 4.3 08/09/2016 1549   CL 98 08/09/2016 1549   CO2 28 08/09/2016 1549   GLUCOSE 91 08/09/2016 1549   BUN 12 08/09/2016 1549   CREATININE 0.91 08/09/2016 1549   CALCIUM 9.5 08/09/2016 1549   PROT 7.1 08/09/2016 1549   ALBUMIN 4.5 08/09/2016 1549   AST 16 08/09/2016 1549   ALT 13 08/09/2016 1549   ALKPHOS 94 08/09/2016 1549   BILITOT 0.5 08/09/2016 1549      Assessment & Plan:  70 year old female with a past medical history of GERD, family history of colon cancer, personal history of sessile serrated adenoma, who is seen in follow-up.   1. GERD -- Symptoms stable. We discussed chronic PPI therapy including the risk of bone mineral density loss. She is followed closely and has history of osteopenia but no fracture or osteoporosis. She is tried omitting PPI but has recurrent symptoms which she states can be moderate  to severe. She prefers to continue omeprazole which we will do at 40 mg daily.  2. Elevated liver enzymes -- possibly drug-induced. LFTs were repeated today and normal which is very reassuring.  3. History of colon polyps and family history of colon cancer -- up-to-date currently and she is aware of her recommended surveillance  interval.  25 minutes spent with the patient today. Greater than 50% was spent in counseling and coordination of care with the patient

## 2016-08-23 ENCOUNTER — Ambulatory Visit: Payer: Medicare Other | Admitting: Internal Medicine

## 2016-08-29 ENCOUNTER — Encounter: Payer: Self-pay | Admitting: Internal Medicine

## 2016-08-29 ENCOUNTER — Ambulatory Visit (INDEPENDENT_AMBULATORY_CARE_PROVIDER_SITE_OTHER): Payer: Medicare Other | Admitting: Internal Medicine

## 2016-08-29 VITALS — BP 104/74 | HR 64 | Ht 64.0 in | Wt 152.4 lb

## 2016-08-29 DIAGNOSIS — J479 Bronchiectasis, uncomplicated: Secondary | ICD-10-CM | POA: Diagnosis not present

## 2016-08-29 NOTE — Progress Notes (Signed)
Subjective:     Patient ID: Cynthia Thornton, female   DOB: 1946/05/31, 70 y.o.   MRN: [id number]  HPI    OV 07/23/2015  Chief Complaint  Patient presents with  . Follow-up    Pt states her breathing is doing well. Pt denies hemoptysis since seeing TP in 03/2015. Pt c/o mild prod cough with Leiter mucus. Pt denies CP/tightness.     70 year old female follow-up for originally followed several years ago for chronic cough but most recently lingular bronchiectasis characterized by intermittent hemoptysis during exacerbations. Negative fiberoptic bronchoscopy mid 2016  Reports no hemoptysis in several months. No shortness of breath. Has mild baseline occasional cough that is completely tolerable for which she does not feel her quality of life is impacted or wants any specific therapy. There are no new issues. She is only worried about recurrence of hemoptysis that Happens every 5 months but otherwise she is good. She will have flu shot today   02/08/2016 Acute office visit: Patient presents to the office today with 24 hour history of increased in mucus production, with some dime size bright red blood noted in her sputum last night. She said it has self resolved. She states that this is the general course that occurs with her bronchiectasis. She denies fever, chest pain, orthopnea, or shortness of breath. No leg or calf pain, no recent air or automobile travel.   OV 05/17/2016  Chief Complaint  Patient presents with  . Follow-up    pt has had 2 episodes of hemoptysis in the past 4 months.  Also has intermittent prod cough with thick, foul tasting mucus.     Follow-up lingular bronchiectasis asymptomatic state with occasional recurrent hemoptysis. This is normally responsive to Z-Pak. Negative fiberoptic bronchoscopy mid 2016  Last seen by myself September 2016 in my nurse practitioner April 2017. Since last seeing me in September 2016 she's had at least 4 episodes of hemoptysis. These occur  randomly and out of the blue. She is otherwise asymptomatic and she does not feel like a cold coming on. She has no infectious symptoms. This no cough or wheezing. She does coughs up dime size hemoptysis. She calls in for a Z-Pak and this helps. One time she even tried ciprofloxacin with Z-Pak always works better. She is not interested in daily azithromycin but is worried about the slightly higher frequency of recurrent hemoptysis then previous. Last CT scan of the chest was in spring 2016. Reviewed labs noticed that she will be in need of autoimmune antibodies and immunoglobulin workup. There is no weight loss or any other symptoms.      OV 08/29/2016  Chief Complaint  Patient presents with  . Follow-up    Pt. states her breathing remains the same, Still coughing, but not coughing up blood, Pt. denies chest pain, wheezing    bronchiectasis asymptomatic state with occasional recurrent hemoptysis. Normally responsive the Z-Pak. Negative fiberoptic bronchoscopy May 2016  Last seen by myself July 2017. Since then no further hemoptysis. But in the interim she did have additional workup for etiology of bronchiectasis. CT showed minimal mild bronchiectasis in the middle lobe and lingula. Otherwise fine. She had autoimmune and immunoglobulin workup and this was normal. She is currently feeling fine. She feels reassured by the negative workup so far    Review of Systems     Objective:   Physical Exam  Constitutional: She is oriented to person, place, and time. She appears well-developed and well-nourished. No distress.  HENT:  Head: Normocephalic and atraumatic.  Right Ear: External ear normal.  Left Ear: External ear normal.  Mouth/Throat: Oropharynx is clear and moist. No oropharyngeal exudate.  Eyes: Conjunctivae and EOM are normal. Pupils are equal, round, and reactive to light. Right eye exhibits no discharge. Left eye exhibits no discharge. No scleral icterus.  Neck: Normal range of  motion. Neck supple. No JVD present. No tracheal deviation present. No thyromegaly present.  Cardiovascular: Normal rate, regular rhythm, normal heart sounds and intact distal pulses.  Exam reveals no gallop and no friction rub.   No murmur heard. Pulmonary/Chest: Effort normal and breath sounds normal. No respiratory distress. She has no wheezes. She has no rales. She exhibits no tenderness.  Abdominal: Soft. Bowel sounds are normal. She exhibits no distension and no mass. There is no tenderness. There is no rebound and no guarding.  Musculoskeletal: Normal range of motion. She exhibits no edema or tenderness.  Lymphadenopathy:    She has no cervical adenopathy.  Neurological: She is alert and oriented to person, place, and time. She has normal reflexes. No cranial nerve deficit. She exhibits normal muscle tone. Coordination normal.  Skin: Skin is warm and dry. No rash noted. She is not diaphoretic. No erythema. No pallor.  Psychiatric: She has a normal mood and affect. Her behavior is normal. Judgment and thought content normal.  Vitals reviewed.   Vitals:   08/29/16 1542  BP: 104/74  Pulse: 64  SpO2: 100%  Weight: 152 lb 6.4 oz (69.1 kg)  Height: 5\' 4"  (1.626 m)    Estimated body mass index is 26.16 kg/m as calculated from the following:   Height as of this encounter: 5\' 4"  (1.626 m).   Weight as of this encounter: 152 lb 6.4 oz (69.1 kg).      Assessment:       ICD-9-CM ICD-10-CM   1. Bronchiectasis without complication (HCC) A999333 J47.9        Plan:      Overall stable Coughing blood is due to mild degree of bronchiectasis AGree  We will hold off inhlaer therapy Observation therapy only Call if any concerns Glad you had flu shot  ROV 9 months or sooner if needed   Dr. Brand Males, M.D., [facility].C.P Pulmonary and Critical Care Medicine Staff Physician [place] [facility] Pager: [phone], If no answer or between  15:00h  - 7:00h: call [phone]  08/29/2016 4:28 PM

## 2016-08-29 NOTE — Patient Instructions (Addendum)
ICD-9-CM ICD-10-CM   1. Bronchiectasis without complication (HCC) A999333 J47.9   2. Hemoptysis 786.30 R04.2    Overall stable Coughing blood is due to mild degree of bronchiectasis AGree  We will hold off inhlaer therapy Observation therapy only Call if any concerns Glad you had flu shot  ROV 9 months or sooner if needed

## 2016-09-22 ENCOUNTER — Encounter: Payer: Self-pay | Admitting: Gynecology

## 2016-10-18 ENCOUNTER — Other Ambulatory Visit: Payer: Medicare Other

## 2016-10-18 DIAGNOSIS — E559 Vitamin D deficiency, unspecified: Secondary | ICD-10-CM

## 2016-10-19 LAB — VITAMIN D 25 HYDROXY (VIT D DEFICIENCY, FRACTURES): Vit D, 25-Hydroxy: 41 ng/mL (ref 30–100)

## 2016-11-02 ENCOUNTER — Telehealth: Payer: Self-pay | Admitting: Internal Medicine

## 2016-11-02 NOTE — Telephone Encounter (Signed)
Patient scheduled medicare wellness appointment for 12/08/16 with Glenard Haring and physical with PCP right after.

## 2016-11-20 ENCOUNTER — Other Ambulatory Visit: Payer: Self-pay | Admitting: *Deleted

## 2016-11-20 MED ORDER — FAMOTIDINE 20 MG PO TABS: ORAL_TABLET | ORAL | 0 refills | Status: DC

## 2016-11-20 MED ORDER — OMEPRAZOLE 40 MG PO CPDR: 40.0000 mg | DELAYED_RELEASE_CAPSULE | ORAL | 0 refills | Status: DC

## 2016-11-20 NOTE — Addendum Note (Signed)
Addended by: Larina Bras on: 11/20/2016 02:17 PM   Modules accepted: Orders

## 2016-12-04 ENCOUNTER — Telehealth: Payer: Self-pay | Admitting: Internal Medicine

## 2016-12-04 MED ORDER — AZITHROMYCIN 250 MG PO TABS: ORAL_TABLET | ORAL | 0 refills | Status: DC

## 2016-12-04 NOTE — Telephone Encounter (Signed)
Spoke with pt. She is aware of MR's recommendation. Rx has been sent in. Nothing further was needed.

## 2016-12-04 NOTE — Telephone Encounter (Signed)
Try Z pak  Dr. Brand Males, M.D., [facility].C.P Pulmonary and Critical Care Medicine Staff Physician [place] [facility] Pager: [phone], If no answer or between  15:00h - 7:00h: call [phone]  12/04/2016 4:40 PM

## 2016-12-04 NOTE — Telephone Encounter (Signed)
Pt c/o chest tightness, sometimes prod cough with foul tasting [place]/yellow/green mucus X3 days.  Denies fever, sinus congestion, chills, increased fatigue.   Pt has been taking robitussin DM q4h Pt requesting further recs.   [facility] [address]   MR please advise.  Thanks.

## 2016-12-06 NOTE — Progress Notes (Signed)
Subjective:   Cynthia Thornton is a 71 y.o. female who presents for Medicare Annual (Subsequent) preventive examination.  Patient states she is concerned because she never sleeps well and she also would like to discuss her recent UTI with PCP today.  Review of Systems:  No ROS.  Medicare Wellness Visit.  Cardiac Risk Factors include: advanced age (>47men, >76 women);sedentary lifestyle Sleep patterns: Pt states she has horrible sleep (difficulty falling asleep and staying asleep). She will discuss with Dr.[name] today. Feels tired all of the time.  Home Safety/Smoke Alarms:  Feels safe in home. Smoke, carbon monoxide, and security alarms in place.  Living environment; residence and Firearm Safety: Lives alone in 1 story home. No guns. Seat Belt Safety/Bike Helmet: Wears seat belt.   Counseling:   Eye Exam- Wears contacts. Dr.McCuen annually. Dental- Dr.Reaves every 3 months.  Female:   Pap- Hysterectomy.       Mammo- Last 09/08/16: BI-RADS Category 1: Negative.       Dexa scan- Last 03/13/16: Osteopenia.       CCS- Last 11/02/14: Pre-cancerous polyp removed. Recall 5 yrs per report.     Objective:     Vitals: BP 114/70 (BP Location: Left Arm, Patient Position: Sitting, Cuff Size: Normal)   Pulse 72   Ht 5\' 4"  (1.626 m)   Wt 153 lb 12.8 oz (69.8 kg)   SpO2 98%   BMI 26.40 kg/m   Body mass index is 26.4 kg/m.   Tobacco History  Smoking Status  . Former Smoker  . Packs/day: 0.20  . Years: 10.00  . Types: Cigarettes  . Quit date: 10/31/1995  Smokeless Tobacco  . Never Used     Counseling given: No   Past Medical History:  Diagnosis Date  . Arthritis   . DUB (dysfunctional uterine bleeding)   . Esophageal reflux 08/15/2006   EGD(Chronic)  . Esophageal stricture 08/15/2006   EGD  . Family history of breast cancer   . Family history of colon cancer   . Hiatal hernia 08/15/2006   EGD  . Hyperthyroidism   . Osteopenia 02/2016   T score -1.9 FRAX 11%/1.7%  . Personal  history of colonic polyps 09/06/2001   hyperplastic  . Pityriasis rosea   . Primary osteoarthritis of left knee    Severe Patellofemoral arthritis  . 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 BREAST SURGERY  2000   Breast cyst removed, left  . TONSILLECTOMY    . VAGINAL HYSTERECTOMY  1991   Right ovarian cystectomy  . VIDEO BRONCHOSCOPY Bilateral 03/16/2015   Procedure: VIDEO BRONCHOSCOPY WITHOUT FLUORO;  Surgeon: Brand Males, MD;  [facility]COPY;  Service: Endoscopy;  Laterality: Bilateral;   Family History  Problem Relation Age of Onset  . Osteoporosis Mother   . Lung cancer Mother   . Stroke Mother   . Diabetes Father   . Hypertension Father   . Osteoporosis Father   . Colon cancer Father 52  . Breast cancer Sister     Age 43  . Ovarian cancer Maternal Aunt 60  . Breast cancer Cousin     maternal-Age 48  . Colon cancer Paternal Uncle 47  . Colon cancer Cousin     maternal first cousin  . Breast cancer Maternal Aunt     great aunt- Age unknown  . Crohn's disease Son    History  Sexual Activity  .  Sexual activity: Not Currently  . Birth control/ protection: Surgical    Comment: 1st intercourse 71 yo-Fewer than 5 partners    Outpatient Encounter Prescriptions as of 12/08/2016  Medication Sig  . aspirin EC 81 MG tablet Take 81 mg by mouth daily.  . Cholecalciferol (VITAMIN D) 2000 units CAPS Take 2,000 Units by mouth daily.  . famotidine (PEPCID) 20 MG tablet Take 1 tablet by mouth every evening as needed  . omeprazole (PRILOSEC) 40 MG capsule Take 1 capsule (40 mg total) by mouth every morning.  . [DISCONTINUED] azithromycin (ZITHROMAX Z-PAK) 250 MG tablet Take 2 tablets (500 mg) on  Day 1,  followed by 1 tablet (250 mg) once daily on Days 2 through 5.   No facility-administered encounter medications on file as of 12/08/2016.     Activities of Daily Living In  your present state of health, do you have any difficulty performing the following activities: 12/08/2016  Hearing? N  Vision? N  Difficulty concentrating or making decisions? N  Walking or climbing stairs? Y  Dressing or bathing? N  Doing errands, shopping? N  Preparing Food and eating ? N  Using the Toilet? N  In the past six months, have you accidently leaked urine? N  Do you have problems with loss of bowel control? N  Managing your Medications? N  Managing your Finances? N  Housekeeping or managing your Housekeeping? N  Some recent data might be hidden    Patient Care Team: Colon Branch, MD as PCP - General Maia Breslow, MD as Consulting Physician (Orthopedic Surgery) Gaynelle Arabian, MD as Consulting Physician (Orthopedic Surgery)    Assessment:    Physical assessment deferred to PCP.  Exercise Activities and Dietary recommendations Current Exercise Habits: The patient does not participate in regular exercise at present, Exercise limited by: orthopedic condition(s) (Bilateral knee pain)   Diet (meal preparation, eat out, water intake, caffeinated beverages, dairy products, fruits and vegetables): in general, a "healthy" diet  , on average, 3 meals per day       Goals    None     Fall Risk Fall Risk  12/08/2016 07/27/2015 04/02/2015  Falls in the past year? No No No   Depression Screen PHQ 2/9 Scores 12/08/2016 07/27/2015 04/02/2015  PHQ - 2 Score 0 0 0     Cognitive Function        Immunization History  Administered Date(s) Administered  . Influenza Split 08/31/2011, 07/30/2014  . Influenza Whole 08/30/2009, 07/30/2010  . Influenza,inj,Quad PF,36+ Mos 09/30/2012, 10/10/2013, 07/26/2015  . Influenza-Unspecified 08/18/2016  . Pneumococcal Conjugate-13 10/10/2013  . Pneumococcal Polysaccharide-23 07/27/2015  . Td 12/02/2008  . Zoster 12/02/2008   Screening Tests Health Maintenance  Topic Date Due  . PAP SMEAR  08/30/2012  . MAMMOGRAM  09/08/2018  . TETANUS/TDAP   12/02/2018  . COLONOSCOPY  11/03/2019  . INFLUENZA VACCINE  Completed  . DEXA SCAN  Completed  . ZOSTAVAX  Completed  . Hepatitis C Screening  Completed  . PNA vac Low Risk Adult  Completed      Plan:     Follow up with Dr.[name] as scheduled.  Continue to eat heart healthy diet (full of fruits, vegetables, whole grains, lean protein, water--limit salt, fat, and sugar intake) and increase physical activity as tolerated.  Bring a copy of your advance directives to your next office visit.   During the course of the visit the patient was educated and counseled about the following appropriate screening and preventive  services:   Vaccines to include Pneumoccal, Influenza, Hepatitis B, Td, Zostavax, HCV-UTD  Cardiovascular Disease  Colorectal cancer screening-UTD  Bone density screening-UTD  Diabetes screening  Glaucoma screening  Mammography/PAP-UTD  Nutrition counseling   Patient Instructions (the written plan) was given to the patient.   Naaman Plummer [place], [place]  12/08/2016  Kathlene November, MD

## 2016-12-06 NOTE — Progress Notes (Deleted)
Pre visit review using our clinic review tool, if applicable. No additional management support is needed unless otherwise documented below in the visit note. 

## 2016-12-06 NOTE — Progress Notes (Signed)
Pre visit review using our clinic review tool, if applicable. No additional management support is needed unless otherwise documented below in the visit note. 

## 2016-12-08 ENCOUNTER — Ambulatory Visit: Payer: Medicare Other | Admitting: *Deleted

## 2016-12-08 ENCOUNTER — Telehealth: Payer: Self-pay

## 2016-12-08 ENCOUNTER — Encounter: Payer: Self-pay | Admitting: Internal Medicine

## 2016-12-08 ENCOUNTER — Ambulatory Visit (INDEPENDENT_AMBULATORY_CARE_PROVIDER_SITE_OTHER): Payer: Medicare Other | Admitting: Internal Medicine

## 2016-12-08 VITALS — BP 114/70 | HR 72 | Ht 64.0 in | Wt 153.8 lb

## 2016-12-08 DIAGNOSIS — D649 Anemia, unspecified: Secondary | ICD-10-CM

## 2016-12-08 DIAGNOSIS — Z0000 Encounter for general adult medical examination without abnormal findings: Secondary | ICD-10-CM | POA: Diagnosis not present

## 2016-12-08 DIAGNOSIS — N390 Urinary tract infection, site not specified: Secondary | ICD-10-CM

## 2016-12-08 LAB — BASIC METABOLIC PANEL
BUN: 14 mg/dL (ref 7–25)
CO2: 24 mmol/L (ref 20–31)
CREATININE: 0.88 mg/dL (ref 0.50–0.99)
Calcium: 9.6 mg/dL (ref 8.6–10.4)
Chloride: 100 mmol/L (ref 98–110)
GLUCOSE: 87 mg/dL (ref 65–99)
Potassium: 4.6 mmol/L (ref 3.5–5.3)
Sodium: 135 mmol/L (ref 135–146)

## 2016-12-08 LAB — CBC WITH DIFFERENTIAL/PLATELET
BASOS ABS: 0 {cells}/uL (ref 0–200)
Basophils Relative: 0 %
EOS PCT: 3 %
Eosinophils Absolute: 225 cells/uL (ref 15–500)
HCT: 33.5 % — ABNORMAL LOW (ref 35.0–45.0)
HEMOGLOBIN: 10.9 g/dL — AB (ref 11.7–15.5)
LYMPHS ABS: 1350 {cells}/uL (ref 850–3900)
LYMPHS PCT: 18 %
MCH: 27.8 pg (ref 27.0–33.0)
MCHC: 32.5 g/dL (ref 32.0–36.0)
MCV: 85.5 fL (ref 80.0–100.0)
MPV: 11.1 fL (ref 7.5–12.5)
Monocytes Absolute: 450 cells/uL (ref 200–950)
Monocytes Relative: 6 %
NEUTROS PCT: 73 %
Neutro Abs: 5475 cells/uL (ref 1500–7800)
Platelets: 192 10*3/uL (ref 140–400)
RBC: 3.92 MIL/uL (ref 3.80–5.10)
RDW: 13.5 % (ref 11.0–15.0)
WBC: 7.5 10*3/uL (ref 3.8–10.8)

## 2016-12-08 LAB — TSH: TSH: 4.64 m[IU]/L — AB

## 2016-12-08 MED ORDER — DOXEPIN HCL 10 MG PO CAPS: 10.0000 mg | ORAL_CAPSULE | Freq: Every evening | ORAL | 1 refills | Status: DC | PRN

## 2016-12-08 NOTE — Assessment & Plan Note (Addendum)
Td 2010; zostavax 2010, prevanr 2014; pnm shot 23 --->07-2015; had a flu shot  CCS: Last colonoscopy 08-2015, + polyps, 5 years. Patient concerned about not having another colonoscopy in 3 years. Will consider discuss with GI by 2019 Female care: Sees gynecology Diet and exercise discussed Had a FLP at another office 11/24/2016: Total cholesterol 210, LDL 100. Labs:   BMP, TSH, CBC

## 2016-12-08 NOTE — Telephone Encounter (Signed)
PA initiated via Covermymeds; KEY: [id number]. Awaiting determination.

## 2016-12-08 NOTE — Progress Notes (Signed)
Subjective:    Patient ID: Cynthia Thornton, female    DOB: [birth year], 71 y.o.   MRN: [id number]  DOS:  12/08/2016 Type of visit - description : CPX Interval history: In general doing well. Her only concern is insomnia, going on for many years.   Review of Systems Constitutional: No fever. No chills. No unexplained wt changes. No unusual sweats  HEENT: No dental problems, no ear discharge, no facial swelling, no voice changes. No eye discharge, no eye  redness , no  intolerance to light   Respiratory: No wheezing , no  difficulty breathing. No cough , no mucus production  Cardiovascular: No CP, no leg swelling , no  Palpitations  GI: no nausea, no vomiting, no diarrhea , no  abdominal pain.  No blood in the stools. No dysphagia, no odynophagia    Endocrine: No polyphagia, no polyuria , no polydipsia  GU: had a  UTI, treated, acute symptoms are gone, she does have some urinary frequency (chronic).  Musculoskeletal: Aches and pains at baseline  Skin: No change in the color of the skin, palor , no  Rash  Allergic, immunologic: No environmental allergies , no  food allergies  Neurological: No dizziness no  syncope. No headaches. No diplopia, no slurred, no slurred speech, no motor deficits, no facial  Numbness  Hematological: No enlarged lymph nodes, no easy bruising , no unusual bleedings  Psychiatry: No suicidal ideas, no hallucinations, no beavior problems, no confusion.  No unusual/severe anxiety, no depression    Past Medical History:  Diagnosis Date  . Arthritis   . DUB (dysfunctional uterine bleeding)   . Esophageal reflux 08/15/2006   EGD(Chronic)  . Esophageal stricture 08/15/2006   EGD  . Family history of breast cancer   . Family history of colon cancer   . Hiatal hernia 08/15/2006   EGD  . Hyperthyroidism   . Osteopenia 02/2016   T score -1.9 FRAX 11%/1.7%  . Personal history of colonic polyps 09/06/2001   hyperplastic  . Pityriasis rosea   . Primary  osteoarthritis of left knee    Severe Patellofemoral arthritis  . 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 BREAST SURGERY  2000   Breast cyst removed, left  . TONSILLECTOMY    . VAGINAL HYSTERECTOMY  1991   Right ovarian cystectomy  . VIDEO BRONCHOSCOPY Bilateral 03/16/2015   Procedure: VIDEO BRONCHOSCOPY WITHOUT FLUORO;  Surgeon: Brand Males, MD;  Location: [facility] ENDOSCOPY;  Service: Endoscopy;  Laterality: Bilateral;    Social History   Social History  . Marital status: Divorced    Spouse name: N/A  . Number of children: 2  . Years of education: N/A   Occupational History  . Pharmacist, hospital, works part time  [employer] [place]  . Smoking status: Former Smoker    Packs/day: 0.20    Years: 10.00    Types: Cigarettes    Quit date: 10/31/1995  . Smokeless tobacco: Never Used  . Alcohol use 0.0 oz/week     Comment: occ  . Drug use: No  . Sexual activity: Not Currently    Birth control/ protection: Surgical     Comment: 1st intercourse 71 yo-Fewer than 5 partners   Other Topics Concern  . Not on file   Social History Narrative   Lives by herself   2 children, one has mental  issues      Family History  Problem Relation Age of Onset  . Osteoporosis Mother   . Lung cancer Mother   . Stroke Mother   . Diabetes Father   . Hypertension Father   . Osteoporosis Father   . Colon cancer Father 67  . Breast cancer Sister     Age 64  . Ovarian cancer Maternal Aunt 60  . Breast cancer Cousin     maternal-Age 95  . Colon cancer Paternal Uncle 30  . Colon cancer Cousin     maternal first cousin  . Breast cancer Maternal Aunt     great aunt- Age unknown  . Crohn's disease Son      Allergies as of 12/08/2016      Reactions   Codeine Nausea Only   Propranolol Hcl Other (See Comments)   dizziness      Medication List       Accurate as of  12/08/16 11:59 PM. Always use your most recent med list.          aspirin EC 81 MG tablet Take 81 mg by mouth daily.   doxepin 10 MG capsule Commonly known as:  SINEQUAN Take 1-2 capsules (10-20 mg total) by mouth at bedtime as needed.   famotidine 20 MG tablet Commonly known as:  PEPCID Take 1 tablet by mouth every evening as needed   omeprazole 40 MG capsule Commonly known as:  PRILOSEC Take 1 capsule (40 mg total) by mouth every morning.   Vitamin D 2000 units Caps Take 2,000 Units by mouth daily.          Objective:   Physical Exam BP 114/70 (BP Location: Left Arm, Patient Position: Sitting, Cuff Size: Normal)   Pulse 72   Ht 5\' 4"  (1.626 m)   Wt 153 lb 12.8 oz (69.8 kg)   SpO2 98%   BMI 26.40 kg/m   General:   Well developed, well nourished . NAD.  Neck: No  thyromegaly  HEENT:  Normocephalic . Face symmetric, atraumatic Lungs:  CTA B Normal respiratory effort, no intercostal retractions, no accessory muscle use. Heart: RRR,  no murmur.  No pretibial edema bilaterally  Abdomen:  Not distended, soft, non-tender. No rebound or rigidity.   Skin: Exposed areas without rash. Not pale. Not jaundice Neurologic:  alert & oriented X3.  Speech normal, gait appropriate for age and unassisted Strength symmetric and appropriate for age.  Psych: Cognition and judgment appear intact.  Cooperative with normal attention span and concentration.  Behavior appropriate. No anxious or depressed appearing.    Assessment & Plan:  Assessment> Bronchiectasis Dr Chase Caller Insomnia  Endocrinology Dr. Loanne Drilling q 3 years, Last visit 03/2015 --H/o thyrotoxicosis on remission --Goiter   Osteopenia --T score -1.5  (2015), last dexa [facility] 02-2016  DJD -- [facility]: GERD, HH and esophageal stricture EGD 2007, colon polyps H/o DUB  Plan Bronchiectasis: Follow-up by pulmonary, currently not an issue History of goiter, on exam today thyroid seem normal. Insomnia:  For many years, Ambien and OTCs did not work (nausea, feeling weird) Tranxene prescribed previously by gynecology help partially. Currently with no major problems with anxiety and depression Plan: Discussed good sleep habits, recommend doxepin 10 or 20 mg when necessary.  UTI: Reports recently had a UTI, acute symptoms done, he has some chronic frequency (plans to discuss with gynecology, thinks related to bladder prolapse). Would like her urine rechecked. Will do RTC one year.

## 2016-12-08 NOTE — Patient Instructions (Addendum)
Follow up with Dr.[name] as scheduled.  Continue to eat heart healthy diet (full of fruits, vegetables, whole grains, lean protein, water--limit salt, fat, and sugar intake) and increase physical activity as tolerated.  Bring a copy of your advance directives to your next office visit.  ========================  GO TO THE LAB : Get the blood work     [place] Schedule your next appointment for a  physical exam in one year.   Try doxepin 10 mg: One or 2 tablets at bedtime to help her sleep  HEALTHY SLEEP Sleep hygiene: Basic rules for a good night's sleep  Sleep only as much as you need to feel rested and then get out of bed  Keep a regular sleep schedule  Avoid forcing sleep  Exercise regularly for at least 20 minutes, preferably 4 to 5 hours before bedtime  Avoid caffeinated beverages after lunch  Avoid alcohol near bedtime: no "night cap"  Avoid smoking, especially in the evening  Do not go to bed hungry  Adjust bedroom environment  Avoid prolonged use of light-emitting screens before bedtime   Deal with your worries before bedtime

## 2016-12-09 LAB — URINE CULTURE: Organism ID, Bacteria: NO GROWTH

## 2016-12-10 NOTE — Assessment & Plan Note (Signed)
Bronchiectasis: Follow-up by pulmonary, currently not an issue History of goiter, on exam today thyroid seem normal. Insomnia: For many years, Ambien and OTCs did not work (nausea, feeling weird) Tranxene prescribed previously by gynecology help partially. Currently with no major problems with anxiety and depression Plan: Discussed good sleep habits, recommend doxepin 10 or 20 mg when necessary.  UTI: Reports recently had a UTI, acute symptoms done, he has some chronic frequency (plans to discuss with gynecology, thinks related to bladder prolapse). Would like her urine rechecked. Will do RTC one year.

## 2016-12-12 MED ORDER — TRAZODONE HCL 50 MG PO TABS: 25.0000 mg | ORAL_TABLET | Freq: Every evening | ORAL | 0 refills | Status: DC | PRN

## 2016-12-12 NOTE — Telephone Encounter (Signed)
Let know the patient about denial from her insurance; if she is willing to pay out of pocket that is okay otherwise change to trazodone  25 mg: One or 2 at bedtime prn  #60, no refills.

## 2016-12-12 NOTE — Telephone Encounter (Signed)
Trazodone 25mg  not available. Per PCP: okay Trazodone 50mg  1/2 to 1 tab qhs prn, #30 and 0RF. LMOM informing Pt of insurance decision and recommendations. Informed Pt to call if questions/concerns. [facility]

## 2016-12-12 NOTE — Telephone Encounter (Signed)
PA denied; denied because the use is either not supported by FDA or by Medicare for approved references for treating medical condition: insomnia. No alternatives listed. Appeal information provided. Please advise.

## 2016-12-15 ENCOUNTER — Ambulatory Visit (INDEPENDENT_AMBULATORY_CARE_PROVIDER_SITE_OTHER): Payer: Medicare Other | Admitting: Gynecology

## 2016-12-15 ENCOUNTER — Encounter: Payer: Self-pay | Admitting: Gynecology

## 2016-12-15 VITALS — BP 120/76 | Ht 64.5 in | Wt 156.0 lb

## 2016-12-15 DIAGNOSIS — N952 Postmenopausal atrophic vaginitis: Secondary | ICD-10-CM

## 2016-12-15 DIAGNOSIS — R3915 Urgency of urination: Secondary | ICD-10-CM | POA: Diagnosis not present

## 2016-12-15 DIAGNOSIS — N8111 Cystocele, midline: Secondary | ICD-10-CM

## 2016-12-15 DIAGNOSIS — M8580 Other specified disorders of bone density and structure, unspecified site: Secondary | ICD-10-CM | POA: Diagnosis not present

## 2016-12-15 DIAGNOSIS — Z01411 Encounter for gynecological examination (general) (routine) with abnormal findings: Secondary | ICD-10-CM

## 2016-12-15 LAB — URINALYSIS W MICROSCOPIC + REFLEX CULTURE
Bacteria, UA: NONE SEEN [HPF]
Bilirubin Urine: NEGATIVE
CASTS: NONE SEEN [LPF]
CRYSTALS: NONE SEEN [HPF]
Glucose, UA: NEGATIVE
HGB URINE DIPSTICK: NEGATIVE
Ketones, ur: NEGATIVE
Leukocytes, UA: NEGATIVE
NITRITE: NEGATIVE
PH: 6 (ref 5.0–8.0)
Protein, ur: NEGATIVE
RBC / HPF: NONE SEEN RBC/HPF (ref <=2)
SPECIFIC GRAVITY, URINE: 1.008 (ref 1.001–1.035)
SQUAMOUS EPITHELIAL / LPF: NONE SEEN [HPF] (ref <=5)
WBC, UA: NONE SEEN WBC/HPF (ref <=5)
Yeast: NONE SEEN [HPF]

## 2016-12-15 NOTE — Addendum Note (Signed)
Addended byDamita Dunnings D on: 12/15/2016 07:59 AM   Modules accepted: Orders

## 2016-12-15 NOTE — Patient Instructions (Signed)

## 2016-12-15 NOTE — Progress Notes (Signed)
    CAMILY FREID Apr 11, 1946 [id number]        71 y.o.  [id number] for annual exam.  Complaining of urinary urgency. Not consistent but at times has to run to get to the bathroom. No overt incontinence. No frequency dysuria low back pain fever or chills.  Past medical history,surgical history, problem list, medications, allergies, family history and social history were all reviewed and documented as reviewed in the EPIC chart.  ROS:  Performed with pertinent positives and negatives included in the history, assessment and plan.   Additional significant findings :  None   Exam: [name] assistant Vitals:   12/15/16 0846  BP: 120/76  Weight: 156 lb (70.8 kg)  Height: 5' 4.5" (1.638 m)   Body mass index is 26.36 kg/m.  General appearance:  Normal affect, orientation and appearance. Skin: Grossly normal HEENT: Without gross lesions.  No cervical or supraclavicular adenopathy. Thyroid normal.  Lungs:  Clear without wheezing, rales or rhonchi Cardiac: RR, without RMG Abdominal:  Soft, nontender, without masses, guarding, rebound, organomegaly or hernia Breasts:  Examined lying and sitting without masses, retractions, discharge or axillary adenopathy. Pelvic:  Ext, BUS, Vagina With atrophic changes. First-degree high cystocele/possible enterocele  Adnexa without masses or tenderness    Anus and perineum normal   Rectovaginal normal sphincter tone without palpated masses or tenderness.    Assessment/Plan:  71 y.o. [id number] female for annual exam.   1. Postmenopausal/atrophic genital changes. Status post [facility] 1991 for DUB. No significant hot flushes, night sweats or vaginal dryness 2. Cystocele/enterocele. First-degree high cystocele/possible enterocele. Stable on serial exams. No symptoms to the patient. Continue to monitor with annual exams. Patient will report any symptoms. 3. Osteopenia. DEXA 02/2016 T score -1.9 FRAX 11%/1.7%.  Overall stable from prior DEXA. Plan repeat DEXA at 2  year interval. 4. Urinary urgency. Patient notes feeling like she has to go urgently at times. Not consistent. No incontinence. No frequency dysuria low back pain fever or chills. Check baseline urine analysis. Reviewed not uncommon with aging. OAB medication options. At this point not overly bothersome to the patient and will monitor. Will call if worsens to consider OAB medications.  5. Pap smear 2012. No history of abnormal Pap smears. No Pap smear done today. Per current screening guidelines we both agree to stop screening based on age and hysterectomy history. 6. Mammography 08/2016. Continue with annual mammography when due. SBE monthly reviewed. 7. Colonoscopy 2016. Repeat at their recommended interval. 8. Health maintenance. No routine lab work done as patient does this elsewhere. Follow up 1 year, sooner as needed.  [name] MD, 9:10 AM 12/15/2016

## 2016-12-22 ENCOUNTER — Other Ambulatory Visit (INDEPENDENT_AMBULATORY_CARE_PROVIDER_SITE_OTHER): Payer: Medicare Other

## 2016-12-22 DIAGNOSIS — D649 Anemia, unspecified: Secondary | ICD-10-CM | POA: Diagnosis not present

## 2016-12-22 LAB — IRON: Iron: 225 ug/dL — ABNORMAL HIGH (ref 42–145)

## 2016-12-22 LAB — FERRITIN: FERRITIN: 62.7 ng/mL (ref 10.0–291.0)

## 2017-01-08 ENCOUNTER — Other Ambulatory Visit: Payer: Self-pay | Admitting: Internal Medicine

## 2017-02-18 ENCOUNTER — Other Ambulatory Visit: Payer: Self-pay | Admitting: Internal Medicine

## 2017-03-23 ENCOUNTER — Encounter: Payer: Self-pay | Admitting: Internal Medicine

## 2017-03-23 ENCOUNTER — Ambulatory Visit (INDEPENDENT_AMBULATORY_CARE_PROVIDER_SITE_OTHER): Payer: Medicare Other | Admitting: Internal Medicine

## 2017-03-23 ENCOUNTER — Ambulatory Visit (HOSPITAL_BASED_OUTPATIENT_CLINIC_OR_DEPARTMENT_OTHER)
Admission: RE | Admit: 2017-03-23 | Discharge: 2017-03-23 | Disposition: A | Discharge status: Home / Self Care | Payer: Medicare Other | Source: Ambulatory Visit | Attending: Internal Medicine | Admitting: Internal Medicine

## 2017-03-23 VITALS — BP 118/76 | HR 76 | Temp 97.6°F | Resp 12 | Ht 64.5 in | Wt 154.4 lb

## 2017-03-23 DIAGNOSIS — M25512 Pain in left shoulder: Secondary | ICD-10-CM | POA: Diagnosis not present

## 2017-03-23 DIAGNOSIS — S4992XA Unspecified injury of left shoulder and upper arm, initial encounter: Secondary | ICD-10-CM | POA: Diagnosis not present

## 2017-03-23 DIAGNOSIS — R2230 Localized swelling, mass and lump, unspecified upper limb: Secondary | ICD-10-CM

## 2017-03-23 NOTE — Progress Notes (Signed)
Subjective:    Patient ID: Cynthia Thornton, female    DOB: 1946-08-21, 71 y.o.   MRN: [id number]  DOS:  03/23/2017 Type of visit - description : Acute Interval history: Yesterday, she hang her bag on the left shoulder and noticed immediate pain, when she looked, she saw a lump, concerned could be a lymphnode Denies any injury. No redness, warmness. No recent fever chills No other lumps on the vicinity. No major shoulder pain per se.    Review of Systems   Past Medical History:  Diagnosis Date  . Arthritis   . DUB (dysfunctional uterine bleeding)   . Esophageal reflux 08/15/2006   EGD(Chronic)  . Esophageal stricture 08/15/2006   EGD  . Family history of breast cancer   . Family history of colon cancer   . Hiatal hernia 08/15/2006   EGD  . Hyperthyroidism   . Osteopenia 02/2016   T score -1.9 FRAX 11%/1.7%  . Personal history of colonic polyps 09/06/2001   hyperplastic  . Pityriasis rosea   . Primary osteoarthritis of left knee    Severe Patellofemoral arthritis  . 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 BREAST SURGERY  2000   Breast cyst removed, left  . TONSILLECTOMY    . VAGINAL HYSTERECTOMY  1991   Right ovarian cystectomy  . VIDEO BRONCHOSCOPY Bilateral 03/16/2015   Procedure: VIDEO BRONCHOSCOPY WITHOUT FLUORO;  Surgeon: Brand Males, MD;  Location: [facility] ENDOSCOPY;  Service: Endoscopy;  Laterality: Bilateral;    Social History   Social History  . Marital status: Divorced    Spouse name: N/A  . Number of children: 2  . Years of education: N/A   Occupational History  . Pharmacist, hospital, works part time  [employer] [place]  . Smoking status: Former Smoker    Quit date: 10/31/1995  . Smokeless tobacco: Never Used  . Alcohol use 0.0 oz/week     Comment: occ  . Drug use: No  . Sexual activity: Not Currently    Birth control/  protection: Surgical     Comment: 1st intercourse 71 yo-Fewer than 5 partners   Other Topics Concern  . Not on file   Social History Narrative   Lives by herself   2 children, one has mental issues       Allergies as of 03/23/2017      Reactions   Codeine Nausea Only   Propranolol Hcl Other (See Comments)   dizziness      Medication List       Accurate as of 03/23/17 11:59 PM. Always use your most recent med list.          aspirin EC 81 MG tablet Take 81 mg by mouth daily.   famotidine 20 MG tablet Commonly known as:  PEPCID TAKE 1 TABLET BY MOUTH EVERY EVENING AS NEEDED   omeprazole 40 MG capsule Commonly known as:  PRILOSEC TAKE 1 CAPSULE (40 MG TOTAL) BY MOUTH EVERY MORNING.   traZODone 50 MG tablet Commonly known as:  DESYREL Take 0.5-1 tablets (25-50 mg total) by mouth at bedtime as needed for sleep.   Vitamin D 2000 units Caps Take 2,000 Units by mouth daily.          Objective:   Physical Exam  Musculoskeletal:       Arms:  BP 118/76 (BP Location: Left Arm,  Patient Position: Sitting, Cuff Size: Small)   Pulse 76   Temp 97.6 F (36.4 C) (Oral)   Resp 12   Ht 5' 4.5" (1.638 m)   Wt 154 lb 6 oz (70 kg)   SpO2 98%   BMI 26.09 kg/m  General:   Well developed, well nourished . NAD.  HEENT:  Normocephalic . Face symmetric, atraumatic Neck: No thyromegaly, no LAD or mass on the neck or supraclavicular area. Axillary areas: No mass or LAD MSK: Both shoulders are full range of motion. She does have some bony lump on the left shoulder. See graphic.  Skin: Not pale. Not jaundice Neurologic:  alert & oriented X3.  Speech normal, gait appropriate for age and unassisted Psych--  Cognition and judgment appear intact.  Cooperative with normal attention span and concentration.  Behavior appropriate. No anxious or depressed appearing.      Assessment & Plan:    Assessment  Bronchiectasis Dr Chase Caller Insomnia  Endocrinology Dr. Loanne Drilling q 3  years, Last visit 03/2015 --H/o thyrotoxicosis on remission --Goiter   Osteopenia --T score -1.5  (2015), last dexa [facility] 02-2016  DJD -- [facility]: GERD, HH and esophageal stricture EGD 2007, colon polyps H/o DUB  Plan AC joint injury: Has a bony lump at the shoulder, shoulder per se is full range of motion and not painful but the lump is slightly TTP. Suspect DJD or a mild AC joint injury . No other lumps, masses at the vecicnity or axillary area. Will get a x-ray and refer to orthopedic.

## 2017-03-23 NOTE — Progress Notes (Signed)
Pre visit review using our clinic review tool, if applicable. No additional management support is needed unless otherwise documented below in the visit note. 

## 2017-03-23 NOTE — Patient Instructions (Signed)
Get your x-ray  Take Tylenol ibuprofen over-the-counter as needed for pain  ICE  Don't forget to come back in 3 months for blood work

## 2017-03-25 NOTE — Assessment & Plan Note (Addendum)
AC joint injury: Has a bony lump at the shoulder, shoulder per se is full range of motion and not painful but the lump is slightly TTP. Suspect DJD or a mild AC joint injury . No other lumps, masses at the vecicnity or axillary area. Will get a x-ray and refer to orthopedic.

## 2017-05-25 ENCOUNTER — Other Ambulatory Visit: Payer: Self-pay | Admitting: Internal Medicine

## 2017-06-01 ENCOUNTER — Telehealth: Payer: Self-pay | Admitting: Internal Medicine

## 2017-06-01 DIAGNOSIS — R7989 Other specified abnormal findings of blood chemistry: Secondary | ICD-10-CM

## 2017-06-01 NOTE — Telephone Encounter (Signed)
Pt called in she said that she was advised at her last visit to come back around this time to have repeat labs. Pt called in to schedule. However, Im not showing any lab orders. Please place orders if needed for scheduling.    CB: [phone]

## 2017-06-01 NOTE — Telephone Encounter (Signed)
Orders placed. Okay to schedule lab appt at her convenience, non-fasting.

## 2017-06-04 NOTE — Telephone Encounter (Signed)
Called pt to make her aware. lvm for pt.

## 2017-06-04 NOTE — Telephone Encounter (Signed)
Called pt. She would like to have orders placed at the [place] office. She said that [place] office is closer to her.
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
## 2017-06-11 ENCOUNTER — Other Ambulatory Visit (INDEPENDENT_AMBULATORY_CARE_PROVIDER_SITE_OTHER): Payer: Medicare Other

## 2017-06-11 DIAGNOSIS — R946 Abnormal results of thyroid function studies: Secondary | ICD-10-CM

## 2017-06-11 DIAGNOSIS — R7989 Other specified abnormal findings of blood chemistry: Secondary | ICD-10-CM

## 2017-06-11 LAB — TSH: TSH: 4.27 u[IU]/mL (ref 0.35–4.50)

## 2017-06-11 LAB — T3, FREE: T3, Free: 2.8 pg/mL (ref 2.3–4.2)

## 2017-06-11 LAB — T4, FREE: Free T4: 1 ng/dL (ref 0.60–1.60)

## 2017-06-22 ENCOUNTER — Ambulatory Visit (INDEPENDENT_AMBULATORY_CARE_PROVIDER_SITE_OTHER): Payer: Medicare Other | Admitting: Internal Medicine

## 2017-06-22 ENCOUNTER — Encounter: Payer: Self-pay | Admitting: Internal Medicine

## 2017-06-22 VITALS — BP 102/70 | HR 71 | Ht 64.5 in | Wt 154.4 lb

## 2017-06-22 DIAGNOSIS — J479 Bronchiectasis, uncomplicated: Secondary | ICD-10-CM | POA: Diagnosis not present

## 2017-06-22 NOTE — Progress Notes (Signed)
Subjective:     Patient ID: Cynthia Thornton, female   DOB: Nov 26, 1945, 71 y.o.   MRN: [id number]  HPI  OV 07/23/2015  Chief Complaint  Patient presents with  . Follow-up    Pt states her breathing is doing well. Pt denies hemoptysis since seeing TP in 03/2015. Pt c/o mild prod cough with [place] mucus. Pt denies CP/tightness.     71 year old female follow-up for originally followed several years ago for chronic cough but most recently lingular bronchiectasis characterized by intermittent hemoptysis during exacerbations. Negative fiberoptic bronchoscopy mid 2016  Reports no hemoptysis in several months. No shortness of breath. Has mild baseline occasional cough that is completely tolerable for which she does not feel her quality of life is impacted or wants any specific therapy. There are no new issues. She is only worried about recurrence of hemoptysis that Happens every 5 months but otherwise she is good. She will have flu shot today   02/08/2016 Acute office visit: Patient presents to the office today with 24 hour history of increased in mucus production, with some dime size bright red blood noted in her sputum last night. She said it has self resolved. She states that this is the general course that occurs with her bronchiectasis. She denies fever, chest pain, orthopnea, or shortness of breath. No leg or calf pain, no recent air or automobile travel.   OV 05/17/2016  Chief Complaint  Patient presents with  . Follow-up    pt has had 2 episodes of hemoptysis in the past 4 months.  Also has intermittent prod cough with thick, foul tasting mucus.     Follow-up lingular bronchiectasis asymptomatic state with occasional recurrent hemoptysis. This is normally responsive to Z-Pak. Negative fiberoptic bronchoscopy mid 2016  Last seen by myself September 2016 in my nurse practitioner April 2017. Since last seeing me in September 2016 she's had at least 4 episodes of hemoptysis. These occur randomly  and out of the blue. She is otherwise asymptomatic and she does not feel like a cold coming on. She has no infectious symptoms. This no cough or wheezing. She does coughs up dime size hemoptysis. She calls in for a Z-Pak and this helps. One time she even tried ciprofloxacin with Z-Pak always works better. She is not interested in daily azithromycin but is worried about the slightly higher frequency of recurrent hemoptysis then previous. Last CT scan of the chest was in spring 2016. Reviewed labs noticed that she will be in need of autoimmune antibodies and immunoglobulin workup. There is no weight loss or any other symptoms.      OV 08/29/2016  Chief Complaint  Patient presents with  . Follow-up    Pt. states her breathing remains the same, Still coughing, but not coughing up blood, Pt. denies chest pain, wheezing    bronchiectasis asymptomatic state with occasional recurrent hemoptysis. Normally responsive the Z-Pak. Negative fiberoptic bronchoscopy May 2016  Last seen by myself July 2017. Since then no further hemoptysis. But in the interim she did have additional workup for etiology of bronchiectasis. CT showed minimal mild bronchiectasis in the middle lobe and lingula. Otherwise fine. She had autoimmune and immunoglobulin workup and this was normal. She is currently feeling fine. She feels reassured by the negative workup so far   OV 06/22/2017  Chief Complaint  Patient presents with  . Follow-up    Pt states she has had an increase in mucus in the morning - milky [place] in color. Pt denies hemoptysis  since last OV. Pt denies CP/tightness and f/c/s.        has a past medical history of Arthritis; DUB (dysfunctional uterine bleeding); Esophageal reflux (08/15/2006); Esophageal stricture (08/15/2006); Family history of breast cancer; Family history of colon cancer; Hiatal hernia (08/15/2006); Hyperthyroidism; Osteopenia (02/2016); Personal history of colonic polyps (09/06/2001);  Pityriasis rosea; Primary osteoarthritis of left knee; and Rhomboid pain.   reports that she quit smoking about 21 years ago. She has never used smokeless tobacco.  Past Surgical History:  Procedure Laterality Date  . BREAST SURGERY  2000   Breast cyst removed, left  . TONSILLECTOMY    . VAGINAL HYSTERECTOMY  1991   Right ovarian cystectomy  . VIDEO BRONCHOSCOPY Bilateral 03/16/2015   Procedure: VIDEO BRONCHOSCOPY WITHOUT FLUORO;  Surgeon: Brand Males, MD;  Location: [facility] ENDOSCOPY;  Service: Endoscopy;  Laterality: Bilateral;    Allergies  Allergen Reactions  . Codeine Nausea Only  . Propranolol Hcl Other (See Comments)    dizziness    Immunization History  Administered Date(s) Administered  . Influenza Split 08/31/2011, 07/30/2014  . Influenza Whole 08/30/2009, 07/30/2010  . Influenza,inj,Quad PF,6+ Mos 09/30/2012, 10/10/2013, 07/26/2015  . Influenza-Unspecified 08/18/2016  . Pneumococcal Conjugate-13 10/10/2013  . Pneumococcal Polysaccharide-23 07/27/2015  . Td 12/02/2008  . Zoster 12/02/2008    Family History  Problem Relation Age of Onset  . Osteoporosis Mother   . Lung cancer Mother   . Stroke Mother   . Diabetes Father   . Hypertension Father   . Osteoporosis Father   . Colon cancer Father 22  . Breast cancer Sister        Age 39  . Ovarian cancer Maternal Aunt 60  . Breast cancer Cousin        maternal-Age 30  . Colon cancer Paternal Uncle 10  . Colon cancer Cousin        maternal first cousin  . Breast cancer Maternal Aunt        great aunt- Age unknown  . Crohn's disease Son      Current Outpatient Prescriptions:  .  aspirin EC 81 MG tablet, Take 81 mg by mouth daily., Disp: , Rfl:  .  Cholecalciferol (VITAMIN D) 2000 units CAPS, Take 2,000 Units by mouth daily., Disp: , Rfl:  .  famotidine (PEPCID) 20 MG tablet, TAKE 1 TABLET BY MOUTH EVERY EVENING AS NEEDED, Disp: 90 tablet, Rfl: 1 .  omeprazole (PRILOSEC) 40 MG capsule, TAKE 1 CAPSULE (40 MG  TOTAL) BY MOUTH EVERY MORNING., Disp: 90 capsule, Rfl: 1   Review of Systems     Objective:   Physical Exam  Constitutional: She is oriented to person, place, and time. She appears well-developed and well-nourished. No distress.  HENT:  Head: Normocephalic and atraumatic.  Right Ear: External ear normal.  Left Ear: External ear normal.  Mouth/Throat: Oropharynx is clear and moist. No oropharyngeal exudate.  Eyes: Pupils are equal, round, and reactive to light. Conjunctivae and EOM are normal. Right eye exhibits no discharge. Left eye exhibits no discharge. No scleral icterus.  Neck: Normal range of motion. Neck supple. No JVD present. No tracheal deviation present. No thyromegaly present.  Cardiovascular: Normal rate, regular rhythm, normal heart sounds and intact distal pulses.  Exam reveals no gallop and no friction rub.   No murmur heard. Pulmonary/Chest: Effort normal and breath sounds normal. No respiratory distress. She has no wheezes. She has no rales. She exhibits no tenderness.  Abdominal: Soft. Bowel sounds are normal. She exhibits no  distension and no mass. There is no tenderness. There is no rebound and no guarding.  Musculoskeletal: Normal range of motion. She exhibits no edema or tenderness.  Lymphadenopathy:    She has no cervical adenopathy.  Neurological: She is alert and oriented to person, place, and time. She has normal reflexes. No cranial nerve deficit. She exhibits normal muscle tone. Coordination normal.  Skin: Skin is warm and dry. No rash noted. She is not diaphoretic. No erythema. No pallor.  Psychiatric: She has a normal mood and affect. Her behavior is normal. Judgment and thought content normal.  Vitals reviewed.  Vitals:   06/22/17 0859  BP: 102/70  Pulse: 71  SpO2: 98%  Weight: 154 lb 6.4 oz (70 kg)  Height: 5' 4.5" (1.638 m)    Estimated body mass index is 26.09 kg/m as calculated from the following:   Height as of this encounter: 5' 4.5" (1.638  m).   Weight as of this encounter: 154 lb 6.4 oz (70 kg).     Assessment:       ICD-10-CM   1. Bronchiectasis without complication ([place]) G66.5        Plan:      Glad you're doing well without any complication It appears that he only had mild cough in the morning At this point in time recommend observation approach Please get high dose flu shot when you can as soon as possible Please talk to PCP Colon Branch, MD -  and ensure you get  shingarix vaccine  Follow-up 12 months or sooner if needed   Dr. Brand Males, M.D., [facility].C.P Pulmonary and Critical Care Medicine Staff Physician [place] [facility] Pager: [phone], If no answer or between  15:00h - 7:00h: call [phone]  06/22/2017 9:24 AM

## 2017-06-22 NOTE — Patient Instructions (Signed)
ICD-10-CM   1. Bronchiectasis without complication ([place]) J09.6    Glad you're doing well without any complication It appears that he only had mild cough in the morning At this point in time recommend observation approach Please get high dose flu shot when you can as soon as possible Please talk to PCP Colon Branch, MD -  and ensure you get  shingarix vaccine  Follow-up 12 months or sooner if needed

## 2017-07-09 ENCOUNTER — Telehealth: Payer: Self-pay | Admitting: Internal Medicine

## 2017-07-09 DIAGNOSIS — D649 Anemia, unspecified: Secondary | ICD-10-CM

## 2017-07-09 NOTE — Telephone Encounter (Signed)
advise patient, needs a CBC, DX anemia. Please arrange

## 2017-07-10 NOTE — Telephone Encounter (Signed)
Letter printed and mailed to Pt, instructed Pt to return to office for labs only to recheck CBC. Labs ordered.

## 2017-09-14 ENCOUNTER — Encounter: Payer: Self-pay | Admitting: Gynecology

## 2017-09-24 ENCOUNTER — Encounter: Payer: Self-pay | Admitting: Gynecology

## 2017-09-26 ENCOUNTER — Encounter: Payer: Self-pay | Admitting: Gynecology

## 2017-09-26 ENCOUNTER — Other Ambulatory Visit: Payer: Self-pay | Admitting: Radiology

## 2017-09-28 ENCOUNTER — Encounter: Payer: Self-pay | Admitting: *Deleted

## 2017-09-28 ENCOUNTER — Telehealth: Payer: Self-pay | Admitting: Hematology and Oncology

## 2017-09-28 NOTE — Telephone Encounter (Signed)
Confirmed morning BC appointment with patient for 12/5, email confirmation sent as well per patient request

## 2017-09-30 ENCOUNTER — Encounter: Payer: Self-pay | Admitting: General Surgery

## 2017-10-01 ENCOUNTER — Encounter: Payer: Self-pay | Admitting: Hematology and Oncology

## 2017-10-02 ENCOUNTER — Other Ambulatory Visit: Payer: Self-pay

## 2017-10-02 ENCOUNTER — Other Ambulatory Visit: Payer: Self-pay | Admitting: *Deleted

## 2017-10-02 DIAGNOSIS — C50312 Malignant neoplasm of lower-inner quadrant of left female breast: Secondary | ICD-10-CM

## 2017-10-02 DIAGNOSIS — Z170 Estrogen receptor positive status [ER+]: Principal | ICD-10-CM

## 2017-10-02 HISTORY — DX: Estrogen receptor positive status (ER+): Z17.0

## 2017-10-03 ENCOUNTER — Ambulatory Visit
Admission: RE | Admit: 2017-10-03 | Discharge: 2017-10-03 | Disposition: A | Discharge status: Home / Self Care | Payer: Medicare Other | Source: Ambulatory Visit | Attending: Radiation Oncology | Admitting: Radiation Oncology

## 2017-10-03 ENCOUNTER — Encounter: Payer: Self-pay | Admitting: Hematology and Oncology

## 2017-10-03 ENCOUNTER — Encounter: Payer: Self-pay | Admitting: *Deleted

## 2017-10-03 ENCOUNTER — Ambulatory Visit: Payer: Medicare Other | Attending: General Surgery | Admitting: Physical Therapy

## 2017-10-03 ENCOUNTER — Ambulatory Visit (HOSPITAL_BASED_OUTPATIENT_CLINIC_OR_DEPARTMENT_OTHER): Payer: Medicare Other | Admitting: Hematology and Oncology

## 2017-10-03 ENCOUNTER — Other Ambulatory Visit (HOSPITAL_BASED_OUTPATIENT_CLINIC_OR_DEPARTMENT_OTHER): Payer: Medicare Other

## 2017-10-03 ENCOUNTER — Other Ambulatory Visit: Payer: Self-pay | Admitting: General Surgery

## 2017-10-03 ENCOUNTER — Encounter: Payer: Self-pay | Admitting: Physical Therapy

## 2017-10-03 DIAGNOSIS — Z170 Estrogen receptor positive status [ER+]: Secondary | ICD-10-CM | POA: Diagnosis not present

## 2017-10-03 DIAGNOSIS — R293 Abnormal posture: Secondary | ICD-10-CM

## 2017-10-03 DIAGNOSIS — C50312 Malignant neoplasm of lower-inner quadrant of left female breast: Secondary | ICD-10-CM | POA: Diagnosis not present

## 2017-10-03 DIAGNOSIS — Z809 Family history of malignant neoplasm, unspecified: Secondary | ICD-10-CM

## 2017-10-03 LAB — COMPREHENSIVE METABOLIC PANEL
ALK PHOS: 107 U/L (ref 40–150)
ALT: 17 U/L (ref 0–55)
AST: 21 U/L (ref 5–34)
Albumin: 4.7 g/dL (ref 3.5–5.0)
Anion Gap: 11 mEq/L (ref 3–11)
BILIRUBIN TOTAL: 0.44 mg/dL (ref 0.20–1.20)
BUN: 24.3 mg/dL (ref 7.0–26.0)
CO2: 23 mEq/L (ref 22–29)
CREATININE: 1 mg/dL (ref 0.6–1.1)
Calcium: 9.7 mg/dL (ref 8.4–10.4)
Chloride: 100 mEq/L (ref 98–109)
EGFR: 57 mL/min/{1.73_m2} — ABNORMAL LOW (ref >60)
GLUCOSE: 90 mg/dL (ref 70–140)
POTASSIUM: 4.3 meq/L (ref 3.5–5.1)
SODIUM: 134 meq/L — AB (ref 136–145)
TOTAL PROTEIN: 7.7 g/dL (ref 6.4–8.3)

## 2017-10-03 LAB — CBC WITH DIFFERENTIAL/PLATELET
BASO%: 0.8 % (ref 0.0–2.0)
Basophils Absolute: 0.1 10*3/uL (ref 0.0–0.1)
EOS%: 3.2 % (ref 0.0–7.0)
Eosinophils Absolute: 0.2 10*3/uL (ref 0.0–0.5)
HCT: 37.6 % (ref 34.8–46.6)
HEMOGLOBIN: 13 g/dL (ref 11.6–15.9)
LYMPH%: 24 % (ref 14.0–49.7)
MCH: 30.5 pg (ref 25.1–34.0)
MCHC: 34.6 g/dL (ref 31.5–36.0)
MCV: 88.3 fL (ref 79.5–101.0)
MONO#: 0.5 10*3/uL (ref 0.1–0.9)
MONO%: 8.8 % (ref 0.0–14.0)
NEUT#: 3.9 10*3/uL (ref 1.5–6.5)
NEUT%: 63.2 % (ref 38.4–76.8)
Platelets: 305 10*3/uL (ref 145–400)
RBC: 4.26 10*6/uL (ref 3.70–5.45)
RDW: 13.4 % (ref 11.2–14.5)
WBC: 6.2 10*3/uL (ref 3.9–10.3)
lymph#: 1.5 10*3/uL (ref 0.9–3.3)

## 2017-10-03 LAB — DRAW EXTRA CLOT TUBE

## 2017-10-03 NOTE — Progress Notes (Signed)
[place] NOTE  Patient Care Team: Colon Branch, MD as PCP - Carolann Littler, MD as Consulting Physician (Orthopedic Surgery) Gaynelle Arabian, MD as Consulting Physician (Orthopedic Surgery) Patrici Ranks (Inactive) as Consulting Physician (Cardiology)  CHIEF COMPLAINTS/PURPOSE OF CONSULTATION:  Newly diagnosed left breast cancer  HISTORY OF PRESENTING ILLNESS:  Cynthia Thornton 71 y.o. female is here because of recent diagnosis of left breast cancer.  Patient has extensive family history of a sister age 35 and a cousin age 26 with breast cancer.  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  She had a screening mammogram that detected calcifications in left breast measuring 1.1 cm span.  Axilla was negative.  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  She was presented this morning to the multidisciplinary tumor board and she is here today to discuss her treatment plan.  I reviewed her records extensively and collaborated the history with the patient.    Malignant neoplasm of lower-inner quadrant of left breast in female, estrogen receptor positive ([place])   09/26/2017 Initial Diagnosis    Screening detected left breast calcifications 1.1 cm span axilla negative, biopsy revealed grade 2 invasive ductal carcinoma with DCIS ER 100% PR 2%, HER-2 negative ratio 0.78, Ki-67 20%, T1CN0 stage Ia clinical stage AJCC 8       MEDICAL HISTORY:  Past Medical History:  Diagnosis Date  . Arthritis   . DUB (dysfunctional uterine bleeding)   . Esophageal reflux 08/15/2006   EGD(Chronic)  . Esophageal stricture 08/15/2006   EGD  . Family history of breast cancer   . Family history of colon cancer   . Hiatal hernia 08/15/2006   EGD  . Hyperthyroidism   . Osteopenia 02/2016   T score -1.9 FRAX 11%/1.7%  . Personal history of colonic polyps 09/06/2001   hyperplastic  . Pityriasis rosea   . Primary osteoarthritis of left knee    Severe Patellofemoral arthritis  . Rhomboid pain    Right and right trapezius with concomitant cervical spondylosis    SURGICAL HISTORY: Past Surgical History:  Procedure Laterality Date  . BREAST SURGERY  2000   Breast cyst removed, left  . TONSILLECTOMY    . VAGINAL HYSTERECTOMY  1991   Right ovarian cystectomy  . VIDEO BRONCHOSCOPY Bilateral 03/16/2015   Procedure: VIDEO BRONCHOSCOPY WITHOUT FLUORO;  Surgeon: Brand Males, MD;  Location: [facility] ENDOSCOPY;  Service: Endoscopy;  Laterality: Bilateral;    SOCIAL HISTORY: Social History   Socioeconomic History  . Marital status: Divorced    Spouse name: Not on file  . Number of children: 2  . Years of education: Not on file  . Highest education level: Not on file  Social Needs  . Financial resource strain: Not on file  . Food insecurity - worry: Not on file  . Food insecurity - inability: Not on file  . Transportation needs - medical: Not on file  . Transportation needs - non-medical: Not on file  Occupational History  . Occupation: Pharmacist, hospital, works part Financial trader: [employer]  Tobacco Use  . Smoking status: Former Smoker    Last attempt to quit: 10/31/1995    Years since quitting: 21.9  . Smokeless tobacco: Never Used  Substance and Sexual Activity  . Alcohol use: Yes    Alcohol/week: 0.0 oz  Comment: occ  . Drug use: No  . Sexual activity: Not Currently    Birth control/protection: Surgical    Comment: 1st intercourse 71 yo-Fewer than 5 partners  Other Topics Concern  . Not on file  Social History Narrative   Lives by herself   2 children, one has mental issues     FAMILY HISTORY: Family History  Problem Relation Age of Onset  . Osteoporosis Mother   . Lung cancer Mother   . Stroke Mother   . Diabetes Father   . Hypertension Father   . Osteoporosis Father   . Colon cancer Father 66  . Breast cancer Sister         Age 61  . Ovarian cancer Maternal Aunt 60  . Breast cancer Cousin        maternal-Age 34  . Colon cancer Paternal Uncle 37  . Colon cancer Cousin        maternal first cousin  . Breast cancer Maternal Aunt        great aunt- Age unknown  . Crohn's disease Son     ALLERGIES:  is allergic to codeine and propranolol hcl.  MEDICATIONS:  Current Outpatient Medications  Medication Sig Dispense Refill  . Biotin 10000 MCG TABS Take by mouth daily.    . Cholecalciferol (VITAMIN D) 2000 units CAPS Take 2,000 Units by mouth daily.    . clorazepate (TRANXENE) 7.5 MG tablet Take 7.5 mg by mouth at bedtime as needed for anxiety.    . cyanocobalamin 1000 MCG tablet Take 1,000 mcg by mouth daily.    . famotidine (PEPCID) 20 MG tablet TAKE 1 TABLET BY MOUTH EVERY EVENING AS NEEDED 90 tablet 1  . omeprazole (PRILOSEC) 40 MG capsule TAKE 1 CAPSULE (40 MG TOTAL) BY MOUTH EVERY MORNING. 90 capsule 1  . aspirin EC 81 MG tablet Take 81 mg by mouth daily.     No current facility-administered medications for this visit.     REVIEW OF SYSTEMS:   Constitutional: Denies fevers, chills or abnormal night sweats Eyes: Denies blurriness of vision, double vision or watery eyes Ears, nose, mouth, throat, and face: Denies mucositis or sore throat Respiratory: Denies cough, dyspnea or wheezes Cardiovascular: Denies palpitation, chest discomfort or lower extremity swelling Gastrointestinal:  Denies nausea, heartburn or change in bowel habits Skin: Denies abnormal skin rashes Lymphatics: Denies new lymphadenopathy or easy bruising Neurological:Denies numbness, tingling or new weaknesses Behavioral/Psych: Mood is stable, no new changes  Breast:  Denies any palpable lumps or discharge All other systems were reviewed with the patient and are negative.  PHYSICAL EXAMINATION: ECOG PERFORMANCE STATUS: 1 - Symptomatic but completely ambulatory  Vitals:   10/03/17 0852  BP: 130/71  Pulse: 73  Resp: 18  Temp:  97.8 F (36.6 C)  SpO2: 100%   Filed Weights   10/03/17 0852  Weight: 154 lb 3.2 oz (69.9 kg)    GENERAL:alert, no distress and comfortable SKIN: skin color, texture, turgor are normal, no rashes or significant lesions EYES: normal, conjunctiva are pink and non-injected, sclera clear OROPHARYNX:no exudate, no erythema and lips, buccal mucosa, and tongue normal  NECK: supple, thyroid normal size, non-tender, without nodularity LYMPH:  no palpable lymphadenopathy in the cervical, axillary or inguinal LUNGS: clear to auscultation and percussion with normal breathing effort HEART: regular rate & rhythm and no murmurs and no lower extremity edema ABDOMEN:abdomen soft, non-tender and normal bowel sounds Musculoskeletal:no cyanosis of digits and no clubbing  PSYCH: alert & oriented x  3 with fluent speech NEURO: no focal motor/sensory deficits BREAST: No palpable nodules in breast. No palpable axillary or supraclavicular lymphadenopathy (exam performed in the presence of a chaperone)   LABORATORY DATA:  I have reviewed the data as listed Lab Results  Component Value Date   WBC 6.2 10/03/2017   HGB 13.0 10/03/2017   HCT 37.6 10/03/2017   MCV 88.3 10/03/2017   PLT 305 10/03/2017   Lab Results  Component Value Date   NA 134 (L) 10/03/2017   K 4.3 10/03/2017   CL 100 12/08/2016   CO2 23 10/03/2017    RADIOGRAPHIC STUDIES: I have personally reviewed the radiological reports and agreed with the findings in the report.  ASSESSMENT AND PLAN:  Malignant neoplasm of lower-inner quadrant of left breast in female, estrogen receptor positive ([place]) 09/18/2017: Screening detected left breast calcifications 1.1 cm span axilla negative, biopsy revealed grade 2 invasive ductal carcinoma with DCIS ER 100% PR 2%, HER-2 negative ratio 0.78, Ki-67 20%, T1CN0 stage Ia clinical stage AJCC 8  Pathology and radiology counseling:Discussed with the patient, the details of pathology including the type of  breast cancer,the clinical staging, the significance of ER, PR and HER-2/neu receptors and the implications for treatment. After reviewing the pathology in detail, we proceeded to discuss the different treatment options between surgery, radiation, chemotherapy, antiestrogen therapies.  Recommendations: 1. Breast conserving surgery followed by 2. Oncotype DX testing to determine if chemotherapy would be of any benefit followed by 3. Adjuvant radiation therapy followed by 4. Adjuvant antiestrogen therapy  Oncotype counseling: I discussed Oncotype DX test. I explained to the patient that this is a 21 gene panel to evaluate patient tumors DNA to calculate recurrence score. This would help determine whether patient has high risk or intermediate risk or low risk breast cancer. She understands that if her tumor was found to be high risk, she would benefit from systemic chemotherapy. If low risk, no need of chemotherapy. If she was found to be intermediate risk, we would need to evaluate the score as well as other risk factors and determine if an abbreviated chemotherapy may be of benefit.  Return to clinic after surgery to discuss final pathology report and then determine if Oncotype DX testing will need to be sent.     All questions were answered. The patient knows to call the clinic with any problems, questions or concerns.    Rulon Eisenmenger, MD 10/03/17

## 2017-10-03 NOTE — Assessment & Plan Note (Signed)
09/18/2017: Screening detected left breast calcifications 1.1 cm span axilla negative, biopsy revealed grade 2 invasive ductal carcinoma with DCIS ER 100% PR 2%, HER-2 negative ratio 0.78, Ki-67 20%, T1CN0 stage Ia clinical stage AJCC 8  Pathology and radiology counseling:Discussed with the patient, the details of pathology including the type of breast cancer,the clinical staging, the significance of ER, PR and HER-2/neu receptors and the implications for treatment. After reviewing the pathology in detail, we proceeded to discuss the different treatment options between surgery, radiation, chemotherapy, antiestrogen therapies.  Recommendations: 1. Breast conserving surgery followed by 2. Oncotype DX testing to determine if chemotherapy would be of any benefit followed by 3. Adjuvant radiation therapy followed by 4. Adjuvant antiestrogen therapy  Oncotype counseling: I discussed Oncotype DX test. I explained to the patient that this is a 21 gene panel to evaluate patient tumors DNA to calculate recurrence score. This would help determine whether patient has high risk or intermediate risk or low risk breast cancer. She understands that if her tumor was found to be high risk, she would benefit from systemic chemotherapy. If low risk, no need of chemotherapy. If she was found to be intermediate risk, we would need to evaluate the score as well as other risk factors and determine if an abbreviated chemotherapy may be of benefit.  Return to clinic after surgery to discuss final pathology report and then determine if Oncotype DX testing will need to be sent.

## 2017-10-03 NOTE — Progress Notes (Signed)
Nutrition Assessment  Reason for Assessment:  Pt seen in Breast Clinic  ASSESSMENT:   71 year old female with new diagnosis of breast cancer.  Past medical history reviewed  Patient reports good appetite.  Medications:  reviewed  Labs: reviewed  Anthropometrics:   Height: 64.5 inches Weight: 154 lb 3.2 oz BMI: 26   NUTRITION DIAGNOSIS: Food and nutrition related knowledge deficit related to new diagnosis of breast cancer as evidenced by no prior need for nutrition related information.  INTERVENTION:   Discussed and provided packet of information regarding nutritional tips for breast cancer patients.  Questions answered.  Teachback method used.  Contact information provided and patient knows to contact me with questions/concerns.    MONITORING, EVALUATION, and GOAL: Pt will consume a healthy plant based diet to maintain lean body mass throughout treatment.   Diana Armijo B. Zenia Resides, [place], [place] Registered Dietitian [phone] (pager)

## 2017-10-03 NOTE — Therapy (Signed)
[place], [place], [address] Phone: [phone]   Fax:  [phone]  Physical Therapy Evaluation  Patient Details  Name: Cynthia Thornton MRN: [id number] Date of Birth: 1946-03-22 Referring Provider: Dr. [name]   Encounter Date: 10/03/2017  PT End of Session - 10/03/17 1349    Visit Number  1    Number of Visits  2    Date for PT Re-Evaluation  11/28/17    PT Start Time  0945    PT Stop Time  1004 Also saw pt from 1022-1048 and 1152-1200 for a total of 43 minutes    PT Time Calculation (min)  19 min    Activity Tolerance  Treatment limited secondary to medical complications (Comment)    Behavior During Therapy  [facility] for tasks assessed/performed       Past Medical History:  Diagnosis Date  . Arthritis   . DUB (dysfunctional uterine bleeding)   . Esophageal reflux 08/15/2006   EGD(Chronic)  . Esophageal stricture 08/15/2006   EGD  . Family history of breast cancer   . Family history of colon cancer   . Hiatal hernia 08/15/2006   EGD  . Hyperthyroidism   . Osteopenia 02/2016   T score -1.9 FRAX 11%/1.7%  . Personal history of colonic polyps 09/06/2001   hyperplastic  . Pityriasis rosea   . Primary osteoarthritis of left knee    Severe Patellofemoral arthritis  . 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 BREAST SURGERY  2000   Breast cyst removed, left  . TONSILLECTOMY    . VAGINAL HYSTERECTOMY  1991   Right ovarian cystectomy  . VIDEO BRONCHOSCOPY Bilateral 03/16/2015   Procedure: VIDEO BRONCHOSCOPY WITHOUT FLUORO;  Surgeon: Brand Males, MD;  Location: [facility] ENDOSCOPY;  Service: Endoscopy;  Laterality: Bilateral;    There were no vitals filed for this visit.   Subjective Assessment - 10/03/17 1340    Subjective  Patient reports she is here today to be seen by her medical team for  her newly diagnosed left breast cancer.    Patient is accompained by:  Family member    Pertinent History  Patient was diagnosed on 09/14/17 with left grade 2 invasive ductal carcinoma with DCIS. It measures 1.1 cm and is located in the lower inner quadrant. It is ER/PR positive and HER2 negative with a Ki67 of 20%. She also has severe scoliosis which she gets physical therapy for and needs a left total knee replacement.    Patient Stated Goals  Reduce lymphedema risk and learn post op HEP    Currently in Pain?  No/denies Has back pain some but not today         [facility] PT Assessment - 10/03/17 0001      Assessment   Medical Diagnosis  Left breast cancer    Referring Provider  Dr. [name]    Onset Date/Surgical Date  09/14/17    Hand Dominance  Right    Prior Therapy  PT for scoliosis      Precautions   Precautions  Other (comment)    Precaution Comments  active cancer      Restrictions   Weight Bearing Restrictions  No      Balance Screen   Has the patient fallen in the past 6 months  Yes    How many times?  1 Tripped  over rug; reports no balance issues    Has the patient had a decrease in activity level because of a fear of falling?   No    Is the patient reluctant to leave their home because of a fear of falling?   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    Leisure  She does not exercise      Cognition   Overall Cognitive Status  Within Functional Limits for tasks assessed      Posture/Postural Control   Posture/Postural Control  Postural limitations    Postural Limitations  Rounded Shoulders;Forward head      ROM / Strength   AROM / PROM / Strength  AROM;Strength      AROM   AROM Assessment Site   Shoulder;Cervical    Right/Left Shoulder  Right;Left    Right Shoulder Extension  54 Degrees    Right Shoulder Flexion  161 Degrees    Right Shoulder ABduction  159 Degrees    Right Shoulder Internal Rotation  62 Degrees    Right Shoulder External Rotation  83 Degrees    Left Shoulder Extension  43 Degrees    Left Shoulder Flexion  139 Degrees    Left Shoulder ABduction  160 Degrees    Left Shoulder Internal Rotation  70 Degrees    Left Shoulder External Rotation  77 Degrees    Cervical Flexion  WNL    Cervical Extension  50% limited    Cervical - Right Side Bend  25% limited    Cervical - Left Side Bend  25% limited    Cervical - Right Rotation  25% limited    Cervical - Left Rotation  25% limited      Strength   Overall Strength  Within functional limits for tasks performed        LYMPHEDEMA/ONCOLOGY QUESTIONNAIRE - 10/03/17 1345      Type   Cancer Type  Left breast cancer      Lymphedema Assessments   Lymphedema Assessments  Upper extremities      Right Upper Extremity Lymphedema   10 cm Proximal to Olecranon Process  29.8 cm    Olecranon Process  24.6 cm    10 cm Proximal to Ulnar Styloid Process  20.9 cm    Just Proximal to Ulnar Styloid Process  14.5 cm    Across Hand at [employer]  17.7 cm    At [place] of 2nd Digit  5.9 cm      Left Upper Extremity Lymphedema   10 cm Proximal to Olecranon Process  29.8 cm    Olecranon Process  24.2 cm    10 cm Proximal to Ulnar Styloid Process  21.4 cm    Just Proximal to Ulnar Styloid Process  14.4 cm    Across Hand at [employer]  17.3 cm    At [place] of 2nd Digit  6 cm          Objective measurements completed on examination: See above findings.     Patient was instructed today in a home exercise program today for post op shoulder range of motion. These included  active assist shoulder flexion in sitting, scapular retraction, wall walking with shoulder abduction, and hands behind head external rotation.  She was  encouraged to do these twice a day, holding 3 seconds and repeating 5 times when permitted by her physician.     PT Education - 10/03/17 1349    Education provided  Yes    Education Details  Lymphedema risk reduction and post op shoulder ROM HEP    Person(s) Educated  Patient;Other (comment) sister and daughter-in-law    Methods  Explanation;Demonstration;Handout    Comprehension  Verbalized understanding;Returned demonstration          PT Long Term Goals - 10/03/17 1416      PT LONG TERM GOAL #1   Title  Patient will demonstrate she has returned to baseline related to shoulder ROM and function post operatively.    Time  8    Period  Weeks    Status  New      Breast Clinic Goals - 10/03/17 1415      Patient will be able to verbalize understanding of pertinent lymphedema risk reduction practices relevant to her diagnosis specifically related to skin care.   Time  1    Period  Days    Status  Achieved      Patient will be able to return demonstrate and/or verbalize understanding of the post-op home exercise program related to regaining shoulder range of motion.   Time  1    Period  Days    Status  Achieved      Patient will be able to verbalize understanding of the importance of attending the postoperative After Breast Cancer Class for further lymphedema risk reduction education and therapeutic exercise.   Time  1    Period  Days    Status  Achieved            Plan - 10/03/17 1412    Clinical Impression Statement  Patient was diagnosed on 09/14/17 with left grade 2 invasive ductal carcinoma with DCIS. It measures 1.1 cm and is located in the lower inner quadrant. It is ER/PR positive and HER2 negative with a Ki67 of 20%. She also has severe scoliosis which she gets physical therapy for and needs a left total knee replacement. Her multidisciplinary medical team met prior to her assessments to determine a recommended treatment plan. She is planning to have a left  lumpectomy and sentinel node biopsy followed by Oncotype testing, radiation, and anti-estrogen therapy. She will have a f/u visit in PT to reassess and detemine needs.     History and Personal Factors relevant to plan of care:  Severe scoliosis and is in PT for that; needs left TKR; lives alone    Clinical Presentation  Stable    Clinical Decision Making  Low    Rehab Potential  Good    Clinical Impairments Affecting Rehab Potential  Scoliosis; OA    PT Frequency  -- Eval and 1 f/u visit 3-4 weeks post op    PT Treatment/Interventions  ADLs/Self Care Home Management;Patient/family education;Therapeutic exercise    PT Next Visit Plan  Will f/u after surgery to determine PT needs    PT Home Exercise Plan  Post op shoulder ROM HEP    Consulted and Agree with Plan of Care  Patient;Family member/caregiver    Family Member Consulted  sister and daughter-in-law       Patient will benefit from skilled therapeutic intervention in order to improve the following deficits and impairments:  Impaired UE functional use, Decreased knowledge of precautions, Decreased range of motion, Postural dysfunction, Pain  Visit Diagnosis: Malignant neoplasm of lower-inner quadrant of left breast in female, estrogen receptor positive ([place]) - Plan: PT plan of care cert/re-cert  Abnormal posture - Plan: PT plan of care cert/re-cert  G-Codes - 81/18/86 1416    Functional Assessment Tool Used (Outpatient Only)  Clinical Judgement    Functional Limitation  Carrying, moving and handling objects    Carrying, Moving and Handling Objects Current Status (L7373)  At least 1 percent but less than 20 percent impaired, limited or restricted    Carrying, Moving and Handling Objects Goal Status (G6815)  At least 1 percent but less than 20 percent impaired, limited or restricted      Patient will follow up at outpatient cancer rehab 3-4 weeks following surgery.  If the patient requires physical therapy at that time, a specific plan  will be dictated and sent to the referring physician for approval. The patient was educated today on appropriate basic range of motion exercises to begin post operatively and the importance of attending the After Breast Cancer class following surgery.  Patient was educated today on lymphedema risk reduction practices as it pertains to recommendations that will benefit the patient immediately following surgery.  She verbalized good understanding.     Problem List Patient Active Problem List   Diagnosis Date Noted  . Malignant neoplasm of lower-inner quadrant of left breast in female, estrogen receptor positive ([place]) 10/02/2017  . Genetic testing 03/03/2016  . Family history of breast cancer   . Family history of colon cancer   . Annual physical exam 07/27/2015  . PCP NOTES >>>>>>> 07/27/2015  . Smoking history 01/15/2015  . Family hx of colon cancer 09/10/2014  . Change in bowel habits 09/10/2014  . Bronchiectasis without complication ([place]) 94/70/7615  . Hemoptysis 06/29/2014  . Bilateral radicular pain 08/02/2012  . Bronchiectasis ([place]) 07/28/2012  . Cough and hemoptysis 05/09/2012  . Osteopenia   . DUB (dysfunctional uterine bleeding)   . Arthritis   . Hypercholesteremia 10/03/2010  . GOITER, MULTINODULAR 11/27/2008  . UNSPECIFIED ANEMIA 11/02/2008  . Thyrotoxicosis 10/22/2008  . COLONIC POLYPS, BENIGN 01/16/2008  . DEGENERATIVE JOINT DISEASE 01/16/2008  . ESOPHAGEAL STRICTURE 04/05/2007  . Headache(784.0) 04/05/2007  . HIATAL HERNIA 08/15/2006  . GERD 07/30/2006   Annia Friendly, PT 10/03/17 2:19 PM  [place], [place], [address] Phone: [phone]   Fax:  [phone]  Name: Cynthia Thornton MRN: [id number] Date of Birth: Feb 27, 1946

## 2017-10-03 NOTE — Progress Notes (Signed)
Clinical Social Work [place] Psychosocial Distress Screening [place]  Patient completed distress screening protocol and scored a 5 on the Psychosocial Distress Thermometer which indicates moderate distress. Clinical Social Worker met with patient and patients family in [facility] to assess for distress and other psychosocial needs. Patient stated she was feeling overwhelmed but felt "better" after meeting with the treatment team and getting more information on her treatment plan. CSW and patient discussed common feeling and emotions when being diagnosed with cancer, and the importance of support during treatment. CSW informed patient of the support team and support services at [facility]. CSW provided contact information and encouraged patient to call with any questions or concerns.  ONCBCN DISTRESS SCREENING 10/03/2017  Screening Type Initial Screening  Distress experienced in past week (1-10) 5  Emotional problem type Nervousness/Anxiety;Adjusting to illness  Spiritual/Religous concerns type Facing my mortality  Information Concerns Type Lack of info about diagnosis;Lack of info about treatment;Lack of info about complementary therapy choices  Physical Problem type Sleep/insomnia  Physician notified of physical symptoms Yes     Johnnye Lana, MSW, LCSW, OSW-C Clinical Social Worker [place] [phone]

## 2017-10-03 NOTE — Progress Notes (Signed)
Radiation Oncology         [phone] ________________________________  Name: Cynthia Thornton        MRN: [id number]  Date of Service: 10/03/2017 DOB: [birth year]  CC:[name], Cynthia Berthold, MD  [name], MD     REFERRING PHYSICIAN: [name], MD   DIAGNOSIS: The encounter diagnosis was Malignant neoplasm of lower-inner quadrant of left breast in female, estrogen receptor positive ([place]).   HISTORY OF PRESENT ILLNESS: Cynthia Thornton is a 71 y.o. female seen in the multidisciplinary breast clinic for a new diagnosis of left breast cancer. The patient was noted to have screening detected calcificatiions of the left breast. She underwent diagnostic imaging which revealed a 1.1 cm span of clcifications at 7:00 in the lower inner quadrant. The axilla was negative for adenopathy by ultrasound. 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     PREVIOUS RADIATION THERAPY: No   PAST MEDICAL HISTORY:  Past Medical History:  Diagnosis Date  . Arthritis   . DUB (dysfunctional uterine bleeding)   . Esophageal reflux 08/15/2006   EGD(Chronic)  . Esophageal stricture 08/15/2006   EGD  . Family history of breast cancer   . Family history of colon cancer   . Hiatal hernia 08/15/2006   EGD  . Hyperthyroidism   . Osteopenia 02/2016   T score -1.9 FRAX 11%/1.7%  . Personal history of colonic polyps 09/06/2001   hyperplastic  . Pityriasis rosea   . Primary osteoarthritis of left knee    Severe Patellofemoral arthritis  . Rhomboid pain    Right and right trapezius with concomitant cervical spondylosis       PAST SURGICAL HISTORY: Past Surgical History:  Procedure Laterality Date  . BREAST SURGERY  2000   Breast cyst removed, left  . TONSILLECTOMY    . VAGINAL HYSTERECTOMY  1991   Right ovarian cystectomy  . VIDEO BRONCHOSCOPY Bilateral 03/16/2015   Procedure: VIDEO  BRONCHOSCOPY WITHOUT FLUORO;  Surgeon: Brand Males, MD;  Location: [facility] ENDOSCOPY;  Service: Endoscopy;  Laterality: Bilateral;     FAMILY HISTORY:  Family History  Problem Relation Age of Onset  . Osteoporosis Mother   . Lung cancer Mother   . Stroke Mother   . Diabetes Father   . Hypertension Father   . Osteoporosis Father   . Colon cancer Father 67  . Breast cancer Sister        Age 25  . Ovarian cancer Maternal Aunt 60  . Breast cancer Cousin        maternal-Age 57  . Colon cancer Paternal Uncle 59  . Colon cancer Cousin        maternal first cousin  . Breast cancer Maternal Aunt        great aunt- Age unknown  . Crohn's disease Son      SOCIAL HISTORY:  reports that she quit smoking about 21 years ago. she has never used smokeless tobacco. She reports that she drinks alcohol. She reports that she does not use drugs. 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   ALLERGIES: Codeine and Propranolol hcl   MEDICATIONS:  Current Outpatient Medications  Medication Sig Dispense Refill  . aspirin EC 81 MG tablet Take 81 mg by mouth daily.    . Cholecalciferol (VITAMIN D) 2000 units  CAPS Take 2,000 Units by mouth daily.    . famotidine (PEPCID) 20 MG tablet TAKE 1 TABLET BY MOUTH EVERY EVENING AS NEEDED 90 tablet 1  . omeprazole (PRILOSEC) 40 MG capsule TAKE 1 CAPSULE (40 MG TOTAL) BY MOUTH EVERY MORNING. 90 capsule 1   No current facility-administered medications for this encounter.      REVIEW OF SYSTEMS: On review of systems, the patient reports that she is doing well overall. She denies any chest pain, shortness of breath, cough, fevers, chills, night sweats, unintended weight changes. She denies any bowel or bladder disturbances, and denies abdominal pain, nausea or vomiting. She denies any new musculoskeletal or joint aches or pains. A complete review of systems is obtained and is otherwise negative.       PHYSICAL EXAM:  Wt Readings from Last 3 Encounters:  06/22/17 154 lb 6.4 oz (70 kg)  03/23/17 154 lb 6 oz (70 kg)  12/15/16 156 lb (70.8 kg)   Temp Readings from Last 3 Encounters:  03/23/17 97.6 F (36.4 C) (Oral)  04/16/16 98.9 F (37.2 C) (Oral)  03/04/16 97.6 F (36.4 C) (Oral)   BP Readings from Last 3 Encounters:  06/22/17 102/70  03/23/17 118/76  12/15/16 120/76   Pulse Readings from Last 3 Encounters:  06/22/17 71  03/23/17 76  12/08/16 72     In general this is a well appearing caucasian  female in no acute distress. She is alert and oriented x4 and appropriate throughout the examination. HEENT reveals that the patient is normocephalic, atraumatic. EOMs are intact. PERRLA. Skin is intact without any evidence of gross lesions. Cardiovascular exam reveals a regular rate and rhythm, no clicks rubs or murmurs are auscultated. Chest is clear to auscultation bilaterally. Lymphatic assessment is performed and does not reveal any adenopathy in the cervical, supraclavicular, axillary, or inguinal chains. Bilateral breast exam is performed and reveals no palpable mass in the right breast. The left breast reveals fullness and ecchymosis at the location of her biopsy site. She has no nipple bleeding or discharge noted.  Abdomen has active bowel sounds in all quadrants and is intact. The abdomen is soft, non tender, non distended. Lower extremities are negative for pretibial pitting edema, deep calf tenderness, cyanosis or clubbing.   ECOG = 0  0 - Asymptomatic (Fully active, able to carry on all predisease activities without restriction)  1 - Symptomatic but completely ambulatory (Restricted in physically strenuous activity but ambulatory and able to carry out work of a light or sedentary nature. For example, light housework, office work)  2 - Symptomatic, <50% in bed during the day (Ambulatory and capable of all self care but unable to carry out any work activities. Up and about  more than 50% of waking hours)  3 - Symptomatic, >50% in bed, but not bedbound (Capable of only limited self-care, confined to bed or chair 50% or more of waking hours)  4 - Bedbound (Completely disabled. Cannot carry on any self-care. Totally confined to bed or chair)  5 - Death   Eustace Pen MM, Creech RH, Tormey DC, et al. [phone]). "Toxicity and response criteria of the [facility]". [place] Oncol. 5 (6): 649-55    LABORATORY DATA:  Lab Results  Component Value Date   WBC 7.5 12/08/2016   HGB 10.9 (L) 12/08/2016   HCT 33.5 (L) 12/08/2016   MCV 85.5 12/08/2016   PLT 192 12/08/2016   Lab Results  Component Value Date   NA  135 12/08/2016   K 4.6 12/08/2016   CL 100 12/08/2016   CO2 24 12/08/2016   Lab Results  Component Value Date   ALT 13 08/09/2016   AST 16 08/09/2016   ALKPHOS 94 08/09/2016   BILITOT 0.5 08/09/2016      RADIOGRAPHY: No results found.     IMPRESSION/PLAN: 1. 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 Dr. Lisbeth Renshaw discusses the pathology findings and reviews the nature of invasive breast disease. The consensus from the breast conference include breast conservation with lumpectomy with  sentinel mapping. Depending on the size of the final tumor measurements rendered by pathology, the tumor may be tested for oncotype dx score to determine a role for systemic therapy. Provided that chemotherapy is not indicated, the patient's course would then be followed by external radiotherapy to the breast followed by antiestrogen therapy. We discussed the risks, benefits, short, and long term effects of radiotherapy, and the patient is interested in proceeding. Dr. Lisbeth Renshaw discusses the delivery and logistics of radiotherapy and anticipates a course of 4 weeks with deep inspiration breath hold technique. We will see her back about 2 weeks after surgery to move forward with the simulation and planning  process and anticipate starting radiotherapy about 4 weeks after surgery.  2. Possible genetic predisposition to malignancy. The patient's personal and family history was reviewed and she's is a candidate for genetic testing.    The above documentation reflects my direct findings during this shared patient visit. Please see the separate note by Dr. Lisbeth Renshaw on this date for the remainder of the patient's plan of care.    Carola Rhine, PAC

## 2017-10-03 NOTE — Patient Instructions (Signed)

## 2017-10-12 ENCOUNTER — Telehealth: Payer: Self-pay | Admitting: *Deleted

## 2017-10-12 NOTE — Telephone Encounter (Signed)
Attempted to call patient. No answer or voicemail.

## 2017-10-16 ENCOUNTER — Telehealth: Payer: Self-pay | Admitting: Hematology and Oncology

## 2017-10-16 NOTE — Telephone Encounter (Signed)
Scheduled appt per 12/18 sch message - left message with appt date and time and sent reminder letter in the mail .

## 2017-10-24 ENCOUNTER — Encounter: Payer: Self-pay | Admitting: *Deleted

## 2017-10-25 ENCOUNTER — Other Ambulatory Visit: Payer: Self-pay

## 2017-10-25 ENCOUNTER — Encounter (HOSPITAL_BASED_OUTPATIENT_CLINIC_OR_DEPARTMENT_OTHER): Payer: Self-pay | Admitting: *Deleted

## 2017-10-28 NOTE — H&P (Signed)
Cynthia Thornton Location: [facility] Patient #: [id number] DOB: Sep 11, 1946 Undefined / Language: Cynthia Thornton / Race: [place] Female       History of Present Illness       The patient is a 71 year old female who presents with breast cancer. This is a 71 year old female who was referred by Dr. Gabriel Rainwater and [facility] for a new diagnosis of invasive ductal carcinoma left breast, lower inner quadrant. She has 2 female family with members with her throughout the encounter today. Dr. Kathlene November is her PCP. Dr. Donalynn Furlong is her gynecologist. Dr. Renato Shin is her endocrinologist.      She has no prior history of significant breast disease other than a left breast cyst excised in the year 2000. Clearly benign. Recent screening imaging studies show a 1.1 cm area of clustered calcifications in the left breast, 7 o'clock position, middle depth. Ultrasound left axilla is negative. 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 ER 100%, PR 2%, Ki-67 20%, HER-2 negative. She developed a hematoma following this procedure      The marker clip migrated 1 cm away from the cancer       Past history is significant for hiatal hernia, GERD, has had 3 EGDs with stricture. Bronchiectasis followed by [place] pulmonary. Former smoker. Treated for hyperthyroidism 2010 but now euthyroid.      Family history reveals sister diagnosed with breast cancer age 62 had to have a total mastectomy because of extensive disease. She is a survivor. Maternal cousin diagnosed with breast cancer age 32 survivor. Recently paternal first cousin was diagnosed with breast cancer. Maternal aunt died of ovarian cancer. Mother died of lung cancer. Father died of colon cancer and diabetes. A cousin had colon cancer. Paternal uncle had colon cancer.       She had genetic testing at the [facility] on January 26, 2016. This was negative. There were no deleterious  mutations. We do not think this needs to be repeated. Family history, nevertheless, is significant. Social history reveals she is divorced. Retired Education officer, museum. Quit smoking 1997. Takes alcohol occasionally. Has 2 children. 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      We discussed her overall breast cancer treatment plan. We discussed her family history genetic and her concerns about her family members. We specifically discussed surgical options. We discussed lumpectomy, management of the axilla, mastectomy, reconstruction She would really like to be considered for breast conservation and I think we can try to do that She wants to be sure that we assess the extent of her disease and says she will have a mastectomy like her sister if there is more extensive disease. I reassured her that we would assess this thoroughly      We are going to wait about 3 weeks before we do her surgery to get the hematoma to settle down.      She will be scheduled for left breast lumpectomy with RSL, inject blue dye, left axillary deep sentinel lymph node biopsy. I discussed the indications, details, techniques, numerous risk of the surgery with her and her 2 family members. She is aware of the risk of bleeding, infection, reoperation for positive margins, reoperation for positive nodes, cosmetic deformity, arm swelling, arm numbness, nerve damage with chronic pain or numbness in the breast. She understands all of these issues. All of her questions are answered. She agrees with this plan.  Plan Oncotype testing.      I have asked my office to schedule her surgery on December 31, which is her strong preference.   Past Surgical History  Breast Biopsy  Left. Colon Polyp Removal - Colonoscopy  Hysterectomy (not due to cancer) - Partial  Oral Surgery  Tonsillectomy   Diagnostic Studies History  Colonoscopy  1-5 years ago Mammogram  within last year Pap Smear  1-5 years  ago  Medication History  Medications Reconciled  Social History  Alcohol use  Occasional alcohol use. Caffeine use  Carbonated beverages, Tea. No drug use  Tobacco use  Former smoker.  Family History  Alcohol Abuse  Mother. Arthritis  Mother. Breast Cancer  Family Members In General, Sister. Cancer  Mother. Cerebrovascular Accident  Family Members In General, Mother. Colon Cancer  Father. Colon Polyps  Sister. Diabetes Mellitus  Father. Hypertension  Father. Ovarian Cancer  Family Members In General. Prostate Cancer  Father. Respiratory Condition  Mother.  Pregnancy / Birth History  Age at menarche  40 years. Age of menopause  51-55 Contraceptive History  Oral contraceptives. Gravida  3 Maternal age  77-25 Para  2 Regular periods   Other Problems  Anxiety Disorder  Arthritis  Back Pain  Bladder Problems  Breast Cancer  Gastroesophageal Reflux Disease  Heart murmur  Hemorrhoids  Lump In Breast  Thyroid Disease     Review of Systems  General Present- Fatigue. Not Present- Appetite Loss, Chills, Fever, Night Sweats, Weight Gain and Weight Loss. Skin Present- Dryness. Not Present- Change in Wart/Mole, Hives, Jaundice, New Lesions, Non-Healing Wounds, Rash and Ulcer. HEENT Present- Hoarseness, Seasonal Allergies and Wears glasses/contact lenses. Not Present- Earache, Hearing Loss, Nose Bleed, Oral Ulcers, Ringing in the Ears, Sinus Pain, Sore Throat, Visual Disturbances and Yellow Eyes. Respiratory Present- Bloody sputum. Not Present- Chronic Cough, Difficulty Breathing, Snoring and Wheezing. Breast Present- Breast Pain. Not Present- Breast Mass, Nipple Discharge and Skin Changes. Cardiovascular Present- Leg Cramps. Not Present- Chest Pain, Difficulty Breathing Lying Down, Palpitations, Rapid Heart Rate, Shortness of Breath and Swelling of Extremities. Female Genitourinary Present- Nocturia and Urgency. Not Present- Frequency,  Painful Urination and Pelvic Pain. Musculoskeletal Present- Back Pain, Joint Stiffness and Muscle Pain. Not Present- Joint Pain, Muscle Weakness and Swelling of Extremities. Psychiatric Present- Anxiety. Not Present- Bipolar, Change in Sleep Pattern, Depression, Fearful and Frequent crying. Endocrine Present- Cold Intolerance and Hair Changes. Not Present- Excessive Hunger, Heat Intolerance, Hot flashes and New Diabetes.   Physical Exam  General Mental Status-Alert. General Appearance-Consistent with stated age. Hydration-Well hydrated. Voice-Normal.  Head and Neck Head-normocephalic, atraumatic with no lesions or palpable masses. Trachea-midline. Thyroid Gland Characteristics - normal size and consistency.  Eye Eyeball - Bilateral-Extraocular movements intact. Sclera/Conjunctiva - Bilateral-No scleral icterus.  Chest and Lung Exam Note: Moves air well. Some coarse wheezing with forced expiration. No rhonchi. No use of accessory muscles or unusual work of breathing   Breast Note: Breasts are small to medium size. In the left breast at the 8 o'clock position there is a 4 cm transversely by 2.5 cm vertically hematoma and some surrounding ecchymoses. No other skin changes in either breast. No other mass in either breast. No axillary adenopathy.   Cardiovascular Cardiovascular examination reveals -normal heart sounds, regular rate and rhythm with no murmurs and normal pedal pulses bilaterally.  Abdomen Inspection Inspection of the abdomen reveals - No Hernias. Skin - Scar - no surgical scars. Palpation/Percussion Palpation and Percussion of the abdomen reveal - Soft, Non Tender,  No Rebound tenderness, No Rigidity (guarding) and No hepatosplenomegaly. Auscultation Auscultation of the abdomen reveals - Bowel sounds normal.  Neurologic Neurologic evaluation reveals -alert and oriented x 3 with no impairment of recent or remote memory. Mental  Status-Normal.  Musculoskeletal Normal Exam - Left-Upper Extremity Strength Normal and Lower Extremity Strength Normal. Normal Exam - Right-Upper Extremity Strength Normal and Lower Extremity Strength Normal.  Lymphatic Head & Neck  General Head & Neck Lymphatics: Bilateral - Description - Normal. Axillary  General Axillary Region: Bilateral - Description - Normal. Tenderness - Non Tender. Femoral & Inguinal  Generalized Femoral & Inguinal Lymphatics: Bilateral - Description - Normal. Tenderness - Non Tender.    Assessment & Plan  PRIMARY CANCER OF LOWER-INNER QUADRANT OF LEFT FEMALE BREAST (C50.312)    Your recent imaging studies and biopsy showed invasive ductal carcinoma left breast, lower inner quadrant you developed a 4 cm hematoma in this area which will resolve spontaneously The tumor is positive for estrogen receptors, negative for HER-2/neu. The biopsy clip migrated, but only 1 cm away from the cancer. This should not be a problem.  You had genetic testing on January 26, 2016 which was negative for deleterious mutations  We have discussed options for surgical treatment including lumpectomy, sentinel node biopsy, mastectomy, and reconstruction After lengthy discussion it is your preference to have breast conservation, and I think that you are a very good candidate for the  We will wait 3 weeks or so for the hematoma to get smaller you will be scheduled for left breast lumpectomy with radioactive seed localization, injection blue dye left breast, and left axillary deep sentinel lymph node biopsy. I have discussed the indications, techniques, and risk of this surgery in detail with you and your family  Dr. Darrel Hoover office will call you on Thursday to begin the scheduling process. We will request that your surgery be done on December 31, but I cannot guarantee that.  After surgery, you will follow-up with Dr. [name] and Dr. Lisbeth Renshaw to decide about other forms of  treatment. This has been discussed in detail  TRAUMATIC HEMATOMA OF FEMALE BREAST, LEFT, INITIAL ENCOUNTER (S20.02XA) BRONCHIECTASIS (J47.9) FAMILY HISTORY OF BREAST CANCER IN FEMALE (Z80.3) GERD WITH STRICTURE (K21.9) HISTORY OF HYPERTHYROIDISM (Z86.39) FORMER SMOKER (Z61.096) FAMILY HISTORY OF COLON CANCER (Z80.0)    Haywood M. Dalbert Batman, M.D., [facility] General and Minimally invasive Surgery Breast and Colorectal Surgery Office:   [phone] Pager:   [phone]

## 2017-10-29 NOTE — Progress Notes (Signed)
Pt in to pick up Ensure pre op drink, instructions reviewed. 

## 2017-11-02 ENCOUNTER — Encounter (HOSPITAL_BASED_OUTPATIENT_CLINIC_OR_DEPARTMENT_OTHER): Payer: Self-pay | Admitting: *Deleted

## 2017-11-02 ENCOUNTER — Encounter (HOSPITAL_BASED_OUTPATIENT_CLINIC_OR_DEPARTMENT_OTHER)
Admission: RE | Disposition: A | Discharge status: Home / Self Care | Payer: Self-pay | Source: Ambulatory Visit | Attending: General Surgery

## 2017-11-02 ENCOUNTER — Ambulatory Visit (HOSPITAL_BASED_OUTPATIENT_CLINIC_OR_DEPARTMENT_OTHER)
Admission: RE | Admit: 2017-11-02 | Discharge: 2017-11-02 | Disposition: A | Discharge status: Home / Self Care | Payer: Medicare Other | Source: Ambulatory Visit | Attending: General Surgery | Admitting: General Surgery

## 2017-11-02 ENCOUNTER — Other Ambulatory Visit: Payer: Self-pay

## 2017-11-02 ENCOUNTER — Ambulatory Visit (HOSPITAL_BASED_OUTPATIENT_CLINIC_OR_DEPARTMENT_OTHER): Payer: Medicare Other | Admitting: Anesthesiology

## 2017-11-02 ENCOUNTER — Encounter (HOSPITAL_COMMUNITY)
Admission: RE | Admit: 2017-11-02 | Discharge: 2017-11-02 | Disposition: A | Discharge status: Home / Self Care | Payer: Medicare Other | Source: Ambulatory Visit | Attending: General Surgery | Admitting: General Surgery

## 2017-11-02 DIAGNOSIS — K219 Gastro-esophageal reflux disease without esophagitis: Secondary | ICD-10-CM | POA: Diagnosis not present

## 2017-11-02 DIAGNOSIS — Z170 Estrogen receptor positive status [ER+]: Secondary | ICD-10-CM

## 2017-11-02 DIAGNOSIS — Z87891 Personal history of nicotine dependence: Secondary | ICD-10-CM | POA: Insufficient documentation

## 2017-11-02 DIAGNOSIS — Z803 Family history of malignant neoplasm of breast: Secondary | ICD-10-CM | POA: Diagnosis not present

## 2017-11-02 DIAGNOSIS — Z800 Family history of malignant neoplasm of digestive organs: Secondary | ICD-10-CM | POA: Insufficient documentation

## 2017-11-02 DIAGNOSIS — M1990 Unspecified osteoarthritis, unspecified site: Secondary | ICD-10-CM | POA: Diagnosis not present

## 2017-11-02 DIAGNOSIS — C50312 Malignant neoplasm of lower-inner quadrant of left female breast: Secondary | ICD-10-CM

## 2017-11-02 DIAGNOSIS — F419 Anxiety disorder, unspecified: Secondary | ICD-10-CM | POA: Insufficient documentation

## 2017-11-02 DIAGNOSIS — Z801 Family history of malignant neoplasm of trachea, bronchus and lung: Secondary | ICD-10-CM | POA: Diagnosis not present

## 2017-11-02 DIAGNOSIS — D0512 Intraductal carcinoma in situ of left breast: Secondary | ICD-10-CM | POA: Insufficient documentation

## 2017-11-02 DIAGNOSIS — K449 Diaphragmatic hernia without obstruction or gangrene: Secondary | ICD-10-CM | POA: Insufficient documentation

## 2017-11-02 DIAGNOSIS — Z8041 Family history of malignant neoplasm of ovary: Secondary | ICD-10-CM | POA: Diagnosis not present

## 2017-11-02 HISTORY — DX: Insomnia, unspecified: G47.00

## 2017-11-02 HISTORY — PX: BREAST LUMPECTOMY WITH RADIOACTIVE SEED AND SENTINEL LYMPH NODE BIOPSY: SHX6550

## 2017-11-02 HISTORY — DX: Other specified postprocedural states: Z98.890

## 2017-11-02 HISTORY — DX: Other specified postprocedural states: R11.2

## 2017-11-02 SURGERY — BREAST LUMPECTOMY WITH RADIOACTIVE SEED AND SENTINEL LYMPH NODE BIOPSY
Anesthesia: General | Site: Breast | Laterality: Left

## 2017-11-02 MED ORDER — CEFAZOLIN SODIUM-DEXTROSE 2-4 GM/100ML-% IV SOLN
INTRAVENOUS | Status: AC
  Filled 2017-11-02: qty 100

## 2017-11-02 MED ORDER — CHLORHEXIDINE GLUCONATE CLOTH 2 % EX PADS: 6.0000 | MEDICATED_PAD | Freq: Once | CUTANEOUS | Status: DC

## 2017-11-02 MED ORDER — 0.9 % SODIUM CHLORIDE (POUR BTL) OPTIME
TOPICAL | Status: DC | PRN
  Administered 2017-11-02: 400 mL

## 2017-11-02 MED ORDER — LIDOCAINE 2% (20 MG/ML) 5 ML SYRINGE
INTRAMUSCULAR | Status: AC
Start: 2017-11-02 — End: 2017-11-02
  Filled 2017-11-02: qty 5

## 2017-11-02 MED ORDER — SCOPOLAMINE 1 MG/3DAYS TD PT72: 1.0000 | MEDICATED_PATCH | Freq: Once | TRANSDERMAL | Status: DC | PRN

## 2017-11-02 MED ORDER — PROMETHAZINE HCL 25 MG/ML IJ SOLN
INTRAMUSCULAR | Status: AC
  Filled 2017-11-02: qty 1

## 2017-11-02 MED ORDER — ONDANSETRON HCL 4 MG/2ML IJ SOLN
INTRAMUSCULAR | Status: AC
  Filled 2017-11-02: qty 2

## 2017-11-02 MED ORDER — MIDAZOLAM HCL 2 MG/2ML IJ SOLN
1.0000 mg | INTRAMUSCULAR | Status: DC | PRN
  Administered 2017-11-02: 1 mg via INTRAVENOUS
  Administered 2017-11-02: 0.5 mg via INTRAVENOUS

## 2017-11-02 MED ORDER — FENTANYL CITRATE (PF) 100 MCG/2ML IJ SOLN
INTRAMUSCULAR | Status: AC
  Filled 2017-11-02: qty 2

## 2017-11-02 MED ORDER — HYDROCODONE-ACETAMINOPHEN 5-325 MG PO TABS: 1.0000 | ORAL_TABLET | Freq: Four times a day (QID) | ORAL | 0 refills | Status: DC | PRN

## 2017-11-02 MED ORDER — ACETAMINOPHEN 500 MG PO TABS
ORAL_TABLET | ORAL | Status: AC
  Filled 2017-11-02: qty 2

## 2017-11-02 MED ORDER — BUPIVACAINE-EPINEPHRINE 0.5% -1:200000 IJ SOLN
INTRAMUSCULAR | Status: DC | PRN
  Administered 2017-11-02: 10 mL

## 2017-11-02 MED ORDER — LACTATED RINGERS IV SOLN
INTRAVENOUS | Status: DC
  Administered 2017-11-02: 10:00:00 via INTRAVENOUS

## 2017-11-02 MED ORDER — MIDAZOLAM HCL 2 MG/2ML IJ SOLN
INTRAMUSCULAR | Status: AC
  Filled 2017-11-02: qty 2

## 2017-11-02 MED ORDER — SODIUM CHLORIDE 0.9 % IJ SOLN
INTRAMUSCULAR | Status: AC
  Filled 2017-11-02: qty 10

## 2017-11-02 MED ORDER — ONDANSETRON HCL 4 MG/2ML IJ SOLN
INTRAMUSCULAR | Status: DC | PRN
  Administered 2017-11-02: 4 mg via INTRAVENOUS

## 2017-11-02 MED ORDER — GABAPENTIN 300 MG PO CAPS
300.0000 mg | ORAL_CAPSULE | ORAL | Status: AC
  Administered 2017-11-02: 300 mg via ORAL

## 2017-11-02 MED ORDER — DEXAMETHASONE SODIUM PHOSPHATE 10 MG/ML IJ SOLN
INTRAMUSCULAR | Status: AC
  Filled 2017-11-02: qty 1

## 2017-11-02 MED ORDER — METHYLENE BLUE 0.5 % INJ SOLN
INTRAVENOUS | Status: AC
  Filled 2017-11-02: qty 10

## 2017-11-02 MED ORDER — ONDANSETRON HCL 4 MG/2ML IJ SOLN
4.0000 mg | Freq: Once | INTRAMUSCULAR | Status: AC | PRN
  Administered 2017-11-02: 4 mg via INTRAVENOUS

## 2017-11-02 MED ORDER — DEXAMETHASONE SODIUM PHOSPHATE 4 MG/ML IJ SOLN
INTRAMUSCULAR | Status: DC | PRN
  Administered 2017-11-02: 10 mg via INTRAVENOUS

## 2017-11-02 MED ORDER — TECHNETIUM TC 99M SULFUR COLLOID FILTERED
1.0000 | Freq: Once | INTRAVENOUS | Status: AC | PRN
  Administered 2017-11-02: 1 via INTRADERMAL

## 2017-11-02 MED ORDER — CELECOXIB 200 MG PO CAPS
200.0000 mg | ORAL_CAPSULE | ORAL | Status: AC
  Administered 2017-11-02: 200 mg via ORAL

## 2017-11-02 MED ORDER — PROPOFOL 10 MG/ML IV BOLUS
INTRAVENOUS | Status: DC | PRN
  Administered 2017-11-02 (×2): 10 mg via INTRAVENOUS
  Administered 2017-11-02: 200 mg via INTRAVENOUS
  Administered 2017-11-02: 10 mg via INTRAVENOUS

## 2017-11-02 MED ORDER — LIDOCAINE 2% (20 MG/ML) 5 ML SYRINGE
INTRAMUSCULAR | Status: DC | PRN
  Administered 2017-11-02: 40 mg via INTRAVENOUS

## 2017-11-02 MED ORDER — FENTANYL CITRATE (PF) 100 MCG/2ML IJ SOLN
25.0000 ug | INTRAMUSCULAR | Status: DC | PRN
  Administered 2017-11-02 (×2): 25 ug via INTRAVENOUS
  Administered 2017-11-02: 50 ug via INTRAVENOUS

## 2017-11-02 MED ORDER — EPHEDRINE 5 MG/ML INJ
INTRAVENOUS | Status: AC
  Filled 2017-11-02: qty 10

## 2017-11-02 MED ORDER — PROMETHAZINE HCL 25 MG/ML IJ SOLN
6.2500 mg | Freq: Once | INTRAMUSCULAR | Status: AC
  Administered 2017-11-02: 6.25 mg via INTRAVENOUS

## 2017-11-02 MED ORDER — BUPIVACAINE-EPINEPHRINE (PF) 0.5% -1:200000 IJ SOLN
INTRAMUSCULAR | Status: DC | PRN
  Administered 2017-11-02: 30 mL

## 2017-11-02 MED ORDER — FENTANYL CITRATE (PF) 100 MCG/2ML IJ SOLN
50.0000 ug | INTRAMUSCULAR | Status: DC | PRN
  Administered 2017-11-02: 25 ug via INTRAVENOUS
  Administered 2017-11-02: 50 ug via INTRAVENOUS

## 2017-11-02 MED ORDER — OXYCODONE HCL 5 MG PO TABS: 5.0000 mg | ORAL_TABLET | Freq: Once | ORAL | Status: DC | PRN

## 2017-11-02 MED ORDER — SODIUM CHLORIDE 0.9 % IJ SOLN
INTRAVENOUS | Status: DC | PRN
  Administered 2017-11-02: 5 mL via INTRADERMAL

## 2017-11-02 MED ORDER — OXYCODONE HCL 5 MG/5ML PO SOLN: 5.0000 mg | Freq: Once | ORAL | Status: DC | PRN

## 2017-11-02 MED ORDER — ACETAMINOPHEN 500 MG PO TABS
1000.0000 mg | ORAL_TABLET | ORAL | Status: AC
  Administered 2017-11-02: 1000 mg via ORAL

## 2017-11-02 MED ORDER — EPHEDRINE SULFATE 50 MG/ML IJ SOLN
INTRAMUSCULAR | Status: DC | PRN
  Administered 2017-11-02: 15 mg via INTRAVENOUS

## 2017-11-02 MED ORDER — CELECOXIB 200 MG PO CAPS
ORAL_CAPSULE | ORAL | Status: AC
  Filled 2017-11-02: qty 1

## 2017-11-02 MED ORDER — GABAPENTIN 300 MG PO CAPS
ORAL_CAPSULE | ORAL | Status: AC
Start: 2017-11-02 — End: 2017-11-02
  Filled 2017-11-02: qty 1

## 2017-11-02 MED ORDER — CEFAZOLIN SODIUM-DEXTROSE 2-4 GM/100ML-% IV SOLN
2.0000 g | INTRAVENOUS | Status: AC
  Administered 2017-11-02: 2 g via INTRAVENOUS

## 2017-11-02 SURGICAL SUPPLY — 57 items
ADH SKN CLS APL DERMABOND .7 (GAUZE/BANDAGES/DRESSINGS) ×1
APPLIER CLIP 11 MED OPEN (CLIP) ×2
APR CLP MED 11 20 MLT OPN (CLIP) ×1
BANDAGE ACE 6X5 VEL STRL LF (GAUZE/BANDAGES/DRESSINGS) IMPLANT
BINDER BREAST LRG (GAUZE/BANDAGES/DRESSINGS) ×1 IMPLANT
BLADE HEX COATED 2.75 (ELECTRODE) ×2 IMPLANT
BLADE SURG 15 STRL LF DISP TIS (BLADE) ×2 IMPLANT
BLADE SURG 15 STRL SS (BLADE) ×4
CANISTER SUCT 1200ML W/VALVE (MISCELLANEOUS) ×2 IMPLANT
CHLORAPREP W/TINT 26ML (MISCELLANEOUS) ×2 IMPLANT
CLIP APPLIE 11 MED OPEN (CLIP) ×1 IMPLANT
COVER BACK TABLE 60X90IN (DRAPES) ×2 IMPLANT
COVER MAYO STAND STRL (DRAPES) ×2 IMPLANT
COVER PROBE W GEL 5X96 (DRAPES) ×2 IMPLANT
DECANTER SPIKE VIAL GLASS SM (MISCELLANEOUS) IMPLANT
DERMABOND ADVANCED (GAUZE/BANDAGES/DRESSINGS) ×1
DERMABOND ADVANCED .7 DNX12 (GAUZE/BANDAGES/DRESSINGS) IMPLANT
DEVICE DUBIN W/COMP PLATE 8390 (MISCELLANEOUS) ×2 IMPLANT
DRAPE LAPAROSCOPIC ABDOMINAL (DRAPES) ×2 IMPLANT
DRAPE UTILITY XL STRL (DRAPES) ×2 IMPLANT
DRSG PAD ABDOMINAL 8X10 ST (GAUZE/BANDAGES/DRESSINGS) ×1 IMPLANT
ELECT REM PT RETURN 9FT ADLT (ELECTROSURGICAL) ×2
ELECTRODE REM PT RTRN 9FT ADLT (ELECTROSURGICAL) ×1 IMPLANT
GAUZE SPONGE 4X4 12PLY STRL (GAUZE/BANDAGES/DRESSINGS) ×2 IMPLANT
GAUZE SPONGE 4X4 12PLY STRL LF (GAUZE/BANDAGES/DRESSINGS) ×1 IMPLANT
GLOVE EUDERMIC 7 POWDERFREE (GLOVE) ×2 IMPLANT
GOWN STRL REUS W/ TWL LRG LVL3 (GOWN DISPOSABLE) ×1 IMPLANT
GOWN STRL REUS W/ TWL XL LVL3 (GOWN DISPOSABLE) ×1 IMPLANT
GOWN STRL REUS W/TWL LRG LVL3 (GOWN DISPOSABLE)
GOWN STRL REUS W/TWL XL LVL3 (GOWN DISPOSABLE) ×4
ILLUMINATOR WAVEGUIDE N/F (MISCELLANEOUS) IMPLANT
KIT MARKER MARGIN INK (KITS) ×2 IMPLANT
LIGHT WAVEGUIDE WIDE FLAT (MISCELLANEOUS) IMPLANT
NDL HYPO 25X1 1.5 SAFETY (NEEDLE) ×2 IMPLANT
NDL SAFETY ECLIPSE 18X1.5 (NEEDLE) ×1 IMPLANT
NEEDLE HYPO 18GX1.5 SHARP (NEEDLE) ×2
NEEDLE HYPO 25X1 1.5 SAFETY (NEEDLE) ×4 IMPLANT
NS IRRIG 1000ML POUR BTL (IV SOLUTION) ×2 IMPLANT
PACK BASIN DAY SURGERY FS (CUSTOM PROCEDURE TRAY) ×2 IMPLANT
PAD ALCOHOL SWAB (MISCELLANEOUS) ×2 IMPLANT
PENCIL BUTTON HOLSTER BLD 10FT (ELECTRODE) ×2 IMPLANT
SHEET MEDIUM DRAPE 40X70 STRL (DRAPES) ×1 IMPLANT
SLEEVE SCD COMPRESS KNEE MED (MISCELLANEOUS) ×2 IMPLANT
SPONGE LAP 18X18 X RAY DECT (DISPOSABLE) ×1 IMPLANT
SPONGE LAP 4X18 X RAY DECT (DISPOSABLE) ×2 IMPLANT
SUT MNCRL AB 4-0 PS2 18 (SUTURE) ×4 IMPLANT
SUT SILK 2 0 SH (SUTURE) ×2 IMPLANT
SUT VIC AB 2-0 CT1 27 (SUTURE)
SUT VIC AB 2-0 CT1 TAPERPNT 27 (SUTURE) IMPLANT
SUT VIC AB 3-0 SH 27 (SUTURE)
SUT VIC AB 3-0 SH 27X BRD (SUTURE) IMPLANT
SUT VICRYL 3-0 CR8 SH (SUTURE) ×2 IMPLANT
SYR 10ML LL (SYRINGE) ×4 IMPLANT
TOWEL OR 17X24 6PK STRL BLUE (TOWEL DISPOSABLE) ×4 IMPLANT
TOWEL OR NON WOVEN STRL DISP B (DISPOSABLE) ×2 IMPLANT
TUBE CONNECTING 20X1/4 (TUBING) ×2 IMPLANT
YANKAUER SUCT BULB TIP NO VENT (SUCTIONS) ×2 IMPLANT

## 2017-11-02 NOTE — Interval H&P Note (Signed)
History and Physical Interval Note:  11/02/2017 10:02 AM  Cynthia Thornton  has presented today for surgery, with the diagnosis of LEFT BREAST CANCER  The various methods of treatment have been discussed with the patient and family. After consideration of risks, benefits and other options for treatment, the patient has consented to  Procedure(s): LEFT BREAST LUMPECTOMY WITH RADIOACTIVE SEED AND LEFT AXILLARY DEEP SENTINEL LYMPH NODE BIOPSY, INJECT BLUE DYE LEFT BREAST (Left) as a surgical intervention .  The patient's history has been reviewed, patient examined, no change in status, stable for surgery.  I have reviewed the patient's chart and labs.  Questions were answered to the patient's satisfaction.     Adin Hector

## 2017-11-02 NOTE — Anesthesia Procedure Notes (Signed)
Anesthesia Regional Block: Pectoralis block   Pre-Anesthetic Checklist: ,, timeout performed, Correct Patient, Correct Site, Correct Laterality, Correct Procedure, Correct Position, site marked, Risks and benefits discussed,  Surgical consent,  Pre-op evaluation,  At surgeon's request and post-op pain management  Laterality: Left  Prep: chloraprep       Needles:  Injection technique: Single-shot  Needle Type: Echogenic Needle     Needle Length: 9cm  Needle Gauge: 21     Additional Needles:   Narrative:  Start time: 11/02/2017 9:40 AM End time: 11/02/2017 9:44 AM Injection made incrementally with aspirations every 5 mL.  Performed by: Personally  Anesthesiologist: Audry Pili, MD  Additional Notes: No pain on injection. No increased resistance to injection. Injection made in 5cc increments. Good needle visualization. Patient tolerated the procedure well.

## 2017-11-02 NOTE — Anesthesia Procedure Notes (Signed)
Procedure Name: LMA Insertion Performed by: Veleda Mun W, CRNA Pre-anesthesia Checklist: Patient identified, Emergency Drugs available, Suction available and Patient being monitored Patient Re-evaluated:Patient Re-evaluated prior to induction Oxygen Delivery Method: Circle system utilized Preoxygenation: Pre-oxygenation with 100% oxygen Induction Type: IV induction Ventilation: Mask ventilation without difficulty LMA: LMA inserted LMA Size: 4.0 Number of attempts: 1 Placement Confirmation: positive ETCO2 Tube secured with: Tape Dental Injury: Teeth and Oropharynx as per pre-operative assessment        

## 2017-11-02 NOTE — Op Note (Signed)
Patient Name:           Cynthia Thornton   Date of Surgery:        11/02/2017  Pre op Diagnosis:      Cancer left breast, lower inner quadrant                                       Postbiopsy traumatic hematoma left breast, lower inner quadrant  Post op Diagnosis:    Same  Procedure:                 Inject blue dye left breast                                      Left breast lumpectomy with radioactive seed localization                                      Reexcision posterior margin, reexcision medial margin, reexcision superior margin                                       Left axillary deep sentinel lymph node biopsy  Surgeon:                     Edsel Petrin. Dalbert Batman, M.D., FACS  Assistant:                      OR staff  Operative Indications:    This is a 72 year old female who was referred by Dr. Gabriel Rainwater and [facility] mammography for a new diagnosis of invasive ductal carcinoma left breast, lower inner quadrant. . Dr. Kathlene November is her PCP. Dr. Donalynn Furlong is her gynecologist. Dr. Renato Shin is her endocrinologist.      She has no prior history of significant breast disease other than a left breast cyst excised in the year 2000. Clearly benign. Recent screening imaging studies show a 1.1 cm area of clustered calcifications in the left breast, 7 o'clock position, middle depth. Ultrasound left axilla is negative. 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 ER 100%, PR 2%, Ki-67 20%, HER-2 negative. She developed a sizable hematoma following this procedure      The marker clip migrated 1 cm away from the cancer      Family history reveals sister diagnosed with breast cancer age 38 had to have a total mastectomy because of extensive disease. She is a survivor. Maternal cousin diagnosed with breast cancer age 43 survivor. Recently paternal first cousin was diagnosed with breast cancer. Maternal aunt died of ovarian cancer. Mother died of lung cancer.  Father died of colon cancer and diabetes. A cousin had colon cancer. Paternal uncle had colon cancer.       She had genetic testing at the [facility] on January 26, 2016. This was negative.       We discussed her overall breast cancer treatment plan. We discussed her family history genetic and her concerns about her family members. We specifically discussed surgical options. We discussed lumpectomy, management of the axilla, mastectomy, reconstruction She would really like to  be considered for breast conservation and I think we can try to do that She wants to be sure that we assess the extent of her disease and says she will have a mastectomy like her sister if there is more extensive disease. I reassured her that we would assess this thoroughly      We are going to wait about 3 weeks before we do her surgery to get the hematoma to settle down.      She will be scheduled for left breast lumpectomy with RSL, inject blue dye, left axillary deep sentinel lymph node biopsy.. She agrees with this plan.    Operative Findings:       The hematoma in the left breast was smaller.  The radioactive seed seemed fairly close to the targeted calcifications.  The original biopsy clip had migrated slightly superiorly, one and a half centimeters or so.  I performed the lumpectomy with a radial ellipse and took the resection all the way down to the pectoralis muscle.  I found the radioactive seed at the posterior medial margin of the specimen.  The radioactive seed was seen in the specimen mammogram and was retrieved by radiology.  I did not feel any cancer.  Because of the uncertainty with the clip migration and the hematoma, I reexcised the medial margin, posterior margin, and superior margin.  I found 3 sentinel lymph nodes that were not grossly abnormal.  Procedure in Detail:          Pectoral block was performed by the anesthesiologist.  Technetium 99 was injected into the left breast by the  nuclear medicine technician.  The patient was taken to the operating room and underwent general anesthesia with LMA device.  Surgical timeout was performed.  Intravenous antibiotic were given.  Following alcohol prep I injected 5 mL of dilute methylene blue into the left breast subareolar area and massaged the breast for a few minutes.  The entire left chest wall and axilla were then prepped and draped in a sterile fashion.  0.5% Marcaine with epinephrine was used as a local infiltration anesthetic into the skin and subcutaneous tissues.      I made a radial ellipse incision in the left breast, lower inner quadrant.  Lumpectomy was performed with neoprobe and electrocautery.  I took a generous specimen since her breast are fairly small and orientation was a little confusing because of the issues mentioned above with clip migration and hematoma.  The specimen was removed.  Radioactive seed and the targeted calcifications were present according to Dr. Barbette Hair.  I then reexcised the superior margin, the posterior margin, and the medial margin.  Basically the broad posterior margin is the pectoral muscle.  Hemostasis was excellent.  The wound was irrigated.  5 metal marker clips were placed into the lumpectomy cavity.  The breast tissues were reapproximated with interrupted sutures of 3-0 Vicryl and the skin closed with a running subcuticular 4-0 Monocryl and Dermabond.     Transverse incision was made in the left axilla at the hairline.  Dissection was carried down through the clavipectoral fascia.  I entered the axilla and found 3 sentinel lymph nodes that were very hot and blue.  I can see lymphatics that had blue dye in them leading to the nodes .  This was all we found.  Hemostasis was excellent.  We irrigated the wound.  The deeper tissues were closed with 3-0 Vicryl sutures and skin closed with a running subcuticular 4-0 Monocryl and Dermabond.  The patient is wounds were dressed and binder was placed.  She was  taken to PACU in stable condition.  EBL 20 mL.  Counts correct.  Complications none.     Edsel Petrin. Dalbert Batman, M.D., FACS General and Minimally Invasive Surgery Breast and Colorectal Surgery   Addendum: I logged onto the [facility] website and reviewed her prescription medication history  11/02/2017 11:52 AM

## 2017-11-02 NOTE — Anesthesia Postprocedure Evaluation (Signed)
Anesthesia Post Note  Patient: Cynthia Thornton  Procedure(s) Performed: LEFT BREAST LUMPECTOMY WITH RADIOACTIVE SEED AND LEFT AXILLARY DEEP SENTINEL LYMPH NODE BIOPSY, INJECT BLUE DYE LEFT BREAST (Left Breast)     Patient location during evaluation: PACU Anesthesia Type: General Level of consciousness: awake and alert Pain management: pain level controlled Vital Signs Assessment: post-procedure vital signs reviewed and stable Respiratory status: spontaneous breathing, nonlabored ventilation and respiratory function stable Cardiovascular status: blood pressure returned to baseline and stable Postop Assessment: no apparent nausea or vomiting Anesthetic complications: no    Last Vitals:  Vitals:   11/02/17 1315 11/02/17 1330  BP: 123/72 122/75  Pulse: 73 68  Resp: 14 14  Temp:    SpO2: 96% 97%    Last Pain:  Vitals:   11/02/17 1330  TempSrc:   PainSc: [place]

## 2017-11-02 NOTE — Discharge Instructions (Signed)
[facility] °Office Phone Number [phone] ° °BREAST BIOPSY/ PARTIAL MASTECTOMY: POST OP INSTRUCTIONS ° °Always review your discharge instruction sheet given to you by the facility where your surgery was performed. ° °IF YOU HAVE DISABILITY OR FAMILY LEAVE FORMS, YOU MUST BRING THEM TO THE OFFICE FOR PROCESSING.  DO NOT GIVE THEM TO YOUR DOCTOR. ° °1. A prescription for pain medication may be given to you upon discharge.  Take your pain medication as prescribed, if needed.  If narcotic pain medicine is not needed, then you may take acetaminophen (Tylenol) or ibuprofen (Advil) as needed. °2. Take your usually prescribed medications unless otherwise directed °3. If you need a refill on your pain medication, please contact your pharmacy.  They will contact our office to request authorization.  Prescriptions will not be filled after 5pm or on week-ends. °4. You should eat very light the first 24 hours after surgery, such as soup, crackers, pudding, etc.  Resume your normal diet the day after surgery. °5. Most patients will experience some swelling and bruising in the breast.  Ice packs and a good support bra will help.  Swelling and bruising can take several days to resolve.  °6. It is common to experience some constipation if taking pain medication after surgery.  Increasing fluid intake and taking a stool softener will usually help or prevent this problem from occurring.  A mild laxative (Milk of Magnesia or Miralax) should be taken according to package directions if there are no bowel movements after 48 hours. °7. Unless discharge instructions indicate otherwise, you may remove your bandages 24-48 hours after surgery, and you may shower at that time.  You may have steri-strips (small skin tapes) in place directly over the incision.  These strips should be left on the skin for 7-10 days.  If your surgeon used skin glue on the incision, you may shower in 24 hours.  The glue will flake off over the  next 2-3 weeks.  Any sutures or staples will be removed at the office during your follow-up visit. °8. ACTIVITIES:  You may resume regular daily activities (gradually increasing) beginning the next day.  Wearing a good support bra or sports bra minimizes pain and swelling.  You may have sexual intercourse when it is comfortable. °a. You may drive when you no longer are taking prescription pain medication, you can comfortably wear a seatbelt, and you can safely maneuver your car and apply brakes. °b. RETURN TO WORK:  ______________________________________________________________________________________ °9. You should see your doctor in the office for a follow-up appointment approximately two weeks after your surgery.  Your doctor’s nurse will typically make your follow-up appointment when she calls you with your pathology report.  Expect your pathology report 2-3 business days after your surgery.  You may call to check if you do not hear from us after three days. °10. OTHER INSTRUCTIONS: _______________________________________________________________________________________________ _____________________________________________________________________________________________________________________________________ °_____________________________________________________________________________________________________________________________________ °_____________________________________________________________________________________________________________________________________ ° °WHEN TO CALL YOUR DOCTOR: °1. Fever over 101.0 °2. Nausea and/or vomiting. °3. Extreme swelling or bruising. °4. Continued bleeding from incision. °5. Increased pain, redness, or drainage from the incision. ° °The clinic staff is available to answer your questions during regular business hours.  Please don’t hesitate to call and ask to speak to one of the nurses for clinical concerns.  If you have a medical emergency, go to the nearest  emergency room or call 911.  A surgeon from [facility] is always on call at the hospital. ° °For further questions, please visit [email or web address]  ° ° ° ° °  Post Anesthesia Home Care Instructions ° °Activity: °Get plenty of rest for the remainder of the day. A responsible individual must stay with you for 24 hours following the procedure.  °For the next 24 hours, DO NOT: °-Drive a car °-Operate machinery °-Drink alcoholic beverages °-Take any medication unless instructed by your physician °-Make any legal decisions or sign important papers. ° °Meals: °Start with liquid foods such as gelatin or soup. Progress to regular foods as tolerated. Avoid greasy, spicy, heavy foods. If nausea and/or vomiting occur, drink only clear liquids until the nausea and/or vomiting subsides. Call your physician if vomiting continues. ° °Special Instructions/Symptoms: °Your throat may feel dry or sore from the anesthesia or the breathing tube placed in your throat during surgery. If this causes discomfort, gargle with warm salt water. The discomfort should disappear within 24 hours. ° °If you had a scopolamine patch placed behind your ear for the management of post- operative nausea and/or vomiting: ° °1. The medication in the patch is effective for 72 hours, after which it should be removed.  Wrap patch in a tissue and discard in the trash. Wash hands thoroughly with soap and water. °2. You may remove the patch earlier than 72 hours if you experience unpleasant side effects which may include dry mouth, dizziness or visual disturbances. °3. Avoid touching the patch. Wash your hands with soap and water after contact with the patch. °  ° °

## 2017-11-02 NOTE — Anesthesia Preprocedure Evaluation (Addendum)
Anesthesia Evaluation  Patient identified by MRN, date of birth, ID band Patient awake    Reviewed: Allergy & Precautions, NPO status , Patient's Chart, lab work & pertinent test results  History of Anesthesia Complications (+) PONV  Airway Mallampati: I  TM Distance: >3 FB Neck ROM: Full    Dental  (+) Dental Advisory Given, Teeth Intact   Pulmonary former smoker,  Bronchiectasis   Pulmonary exam normal breath sounds clear to auscultation       Cardiovascular negative cardio ROS Normal cardiovascular exam Rhythm:Regular Rate:Normal     Neuro/Psych Anxiety negative neurological ROS     GI/Hepatic Neg liver ROS, hiatal hernia, GERD  ,Esoph stricture   Endo/Other  negative endocrine ROSHx hyperthyroidism in past, no longer on meds  Renal/GU negative Renal ROS  negative genitourinary   Musculoskeletal  (+) Arthritis ,   Abdominal   Peds  Hematology negative hematology ROS (+)   Anesthesia Other Findings   Reproductive/Obstetrics DUB                           Anesthesia Physical Anesthesia Plan  ASA: II  Anesthesia Plan: General   Post-op Pain Management:  Regional for Post-op pain   Induction: Intravenous  PONV Risk Score and Plan: Treatment may vary due to age or medical condition, Ondansetron, Dexamethasone and Propofol infusion  Airway Management Planned: LMA  Additional Equipment: None  Intra-op Plan:   Post-operative Plan: Extubation in OR  Informed Consent: I have reviewed the patients History and Physical, chart, labs and discussed the procedure including the risks, benefits and alternatives for the proposed anesthesia with the patient or authorized representative who has indicated his/her understanding and acceptance.   Dental advisory given  Plan Discussed with: CRNA  Anesthesia Plan Comments:        Anesthesia Quick Evaluation

## 2017-11-02 NOTE — Transfer of Care (Signed)
Immediate Anesthesia Transfer of Care Note  Patient: Cynthia Thornton  Procedure(s) Performed: LEFT BREAST LUMPECTOMY WITH RADIOACTIVE SEED AND LEFT AXILLARY DEEP SENTINEL LYMPH NODE BIOPSY, INJECT BLUE DYE LEFT BREAST (Left Breast)  Patient Location: PACU  Anesthesia Type:General  Level of Consciousness: awake and sedated  Airway & Oxygen Therapy: Patient Spontanous Breathing and Patient connected to face mask oxygen  Post-op Assessment: Report given to RN and Post -op Vital signs reviewed and stable  Post vital signs: Reviewed and stable  Last Vitals:  Vitals:   11/02/17 0955 11/02/17 1000  BP:    Pulse: 90 88  Resp: 17 16  Temp:    SpO2: 100% 100%    Last Pain:  Vitals:   11/02/17 0855  TempSrc: Oral         Complications: No apparent anesthesia complications

## 2017-11-02 NOTE — Progress Notes (Addendum)
Assisted Dr. Fransisco Beau with left, ultrasound guided, pectoralis block and nuc med tech with nuc med inj . Side rails up, monitors on throughout procedure. See vital signs in flow sheet. Tolerated Procedure well.

## 2017-11-05 ENCOUNTER — Encounter (HOSPITAL_BASED_OUTPATIENT_CLINIC_OR_DEPARTMENT_OTHER): Payer: Self-pay | Admitting: General Surgery

## 2017-11-07 NOTE — Assessment & Plan Note (Addendum)
10/31/17: Left Lumpectomy: IDC grade 2, 1.2 cm, 0/3 LN Neg, , ER 100% PR 2%, HER-2 negative ratio 0.78, Ki-67 20% T1c N0 Stage 1A  Pathology counseling: I discussed the final pathology report of the patient provided  a copy of this report. I discussed the margins as well as lymph node surgeries. We also discussed the final staging along with previously performed ER/PR and HER-2/neu testing.  Recommendation: 1. Oncotype DX testing to determine if chemotherapy would be of any benefit followed by 2. Adjuvant radiation therapy followed by 3. Adjuvant antiestrogen therapy  RTC base don Oncotype score results

## 2017-11-08 ENCOUNTER — Telehealth: Payer: Self-pay | Admitting: *Deleted

## 2017-11-08 ENCOUNTER — Encounter: Payer: Self-pay | Admitting: Hematology and Oncology

## 2017-11-08 ENCOUNTER — Inpatient Hospital Stay: Payer: Medicare Other | Attending: Hematology and Oncology | Admitting: Hematology and Oncology

## 2017-11-08 DIAGNOSIS — Z170 Estrogen receptor positive status [ER+]: Secondary | ICD-10-CM | POA: Diagnosis not present

## 2017-11-08 DIAGNOSIS — C50312 Malignant neoplasm of lower-inner quadrant of left female breast: Secondary | ICD-10-CM | POA: Diagnosis present

## 2017-11-08 NOTE — Telephone Encounter (Signed)
Ordered oncotype per Dr. Gudena.  Faxed requisition to pathology and confirmed receipt. 

## 2017-11-08 NOTE — Progress Notes (Signed)
Patient Care Team: Colon Branch, MD as PCP - Carolann Littler, MD as Consulting Physician (Orthopedic Surgery) Gaynelle Arabian, MD as Consulting Physician (Orthopedic Surgery) Patrici Ranks (Inactive) as Consulting Physician (Cardiology)  DIAGNOSIS:  Encounter Diagnosis  Name Primary?  . Malignant neoplasm of lower-inner quadrant of left breast in female, estrogen receptor positive (Cynthia Thornton)     SUMMARY OF ONCOLOGIC HISTORY:   Malignant neoplasm of lower-inner quadrant of left breast in female, estrogen receptor positive (Cynthia Thornton)   09/26/2017 Initial Diagnosis    Screening detected left breast calcifications 1.1 cm span axilla negative, biopsy revealed grade 2 invasive ductal carcinoma with DCIS ER 100% PR 2%, HER-2 negative ratio 0.78, Ki-67 20%, T1CN0 stage Ia clinical stage AJCC 8      11/02/2017 Surgery    Left Lumpectomy: IDC grade 2, 1.2 cm, 0/3 LN Neg, , ER 100% PR 2%, HER-2 negative ratio 0.78, Ki-67 20% T1c N0 Stage 1A       CHIEF COMPLIANT: Follow-up after left lumpectomy  INTERVAL HISTORY: Cynthia Thornton is a 72 year old with above-mentioned history of left breast cancer underwent left lumpectomy and is here today to discuss the pathology report.  She is recovering very well from recent surgery.  She is slightly uncomfortable in the axilla but otherwise recovering very well.  REVIEW OF SYSTEMS:   Constitutional: Denies fevers, chills or abnormal weight loss Eyes: Denies blurriness of vision Ears, nose, mouth, throat, and face: Denies mucositis or sore throat Respiratory: Denies cough, dyspnea or wheezes Cardiovascular: Denies palpitation, chest discomfort Gastrointestinal:  Denies nausea, heartburn or change in bowel habits Skin: Denies abnormal skin rashes Lymphatics: Denies new lymphadenopathy or easy bruising Neurological:Denies numbness, tingling or new weaknesses Behavioral/Psych: Mood is stable, no new changes  Extremities: No lower extremity edema Breast:  Recent left lumpectomy All other systems were reviewed with the patient and are negative.  I have reviewed the past medical history, past surgical history, social history and family history with the patient and they are unchanged from previous note.  ALLERGIES:  is allergic to codeine and propranolol hcl.  MEDICATIONS:  Current Outpatient Medications  Medication Sig Dispense Refill  . aspirin EC 81 MG tablet Take 81 mg by mouth daily.    . Biotin 10000 MCG TABS Take by mouth daily.    . Cholecalciferol (VITAMIN D) 2000 units CAPS Take 2,000 Units by mouth daily.    . clorazepate (TRANXENE) 7.5 MG tablet Take 7.5 mg by mouth at bedtime as needed for anxiety.    . cyanocobalamin 1000 MCG tablet Take 1,000 mcg by mouth daily.    . famotidine (PEPCID) 20 MG tablet TAKE 1 TABLET BY MOUTH EVERY EVENING AS NEEDED 90 tablet 1  . HYDROcodone-acetaminophen (NORCO) 5-325 MG tablet Take 1-2 tablets by mouth every 6 (six) hours as needed for moderate pain or severe pain. 30 tablet 0  . omeprazole (PRILOSEC) 40 MG capsule TAKE 1 CAPSULE (40 MG TOTAL) BY MOUTH EVERY MORNING. 90 capsule 1   No current facility-administered medications for this visit.     PHYSICAL EXAMINATION: ECOG PERFORMANCE STATUS: 1 - Symptomatic but completely ambulatory  Vitals:   11/08/17 1221  BP: (!) 143/73  Pulse: 72  Resp: 18  Temp: 98.3 F (36.8 C)  SpO2: 100%   Filed Weights   11/08/17 1221  Weight: 155 lb 4.8 oz (70.4 kg)    GENERAL:alert, no distress and comfortable SKIN: skin color, texture, turgor are normal, no rashes or significant lesions EYES: normal,  Conjunctiva are pink and non-injected, sclera clear OROPHARYNX:no exudate, no erythema and lips, buccal mucosa, and tongue normal  NECK: supple, thyroid normal size, non-tender, without nodularity LYMPH:  no palpable lymphadenopathy in the cervical, axillary or inguinal LUNGS: clear to auscultation and percussion with normal breathing effort HEART:  regular rate & rhythm and no murmurs and no lower extremity edema ABDOMEN:abdomen soft, non-tender and normal bowel sounds MUSCULOSKELETAL:no cyanosis of digits and no clubbing  NEURO: alert & oriented x 3 with fluent speech, no focal motor/sensory deficits EXTREMITIES: No lower extremity edema  LABORATORY DATA:  I have reviewed the data as listed CMP Latest Ref Rng & Units 10/03/2017 12/08/2016 08/09/2016  Glucose 70 - 140 mg/dl 90 87 91  BUN 7.0 - 26.0 mg/dL 24._0 Creatinine 0.6 - 1.1 mg/dL 1.0 0.88 0.91  Sodium 136 - 145 mEq/L 134(L) 135 134(L)  Potassium 3.5 - 5.1 mEq/L 4.3 4.6 4.3  Chloride 98 - 110 mmol/L - 100 98  CO2 22 - 29 mEq/L _1 Calcium 8.4 - 10.4 mg/dL 9.7 9.6 9.5  Total Protein 6.4 - 8.3 g/dL 7.7 - 7.1  Total Bilirubin 0.20 - 1.20 mg/dL 0.44 - 0.5  Alkaline Phos 40 - 150 U/L 107 - 94  AST 5 - 34 U/L 21 - 16  ALT 0 - 55 U/L 17 - 13    Lab Results  Component Value Date   WBC 6.2 10/03/2017   HGB 13.0 10/03/2017   HCT 37.6 10/03/2017   MCV 88.3 10/03/2017   PLT 305 10/03/2017   NEUTROABS 3.9 10/03/2017    ASSESSMENT & PLAN:  Malignant neoplasm of lower-inner quadrant of left breast in female, estrogen receptor positive (Cynthia Thornton) 10/31/17: Left Lumpectomy: IDC grade 2, 1.2 cm, 0/3 LN Neg, , ER 100% PR 2%, HER-2 negative ratio 0.78, Ki-67 20% T1c N0 Stage 1A  Pathology counseling: I discussed the final pathology report of the patient provided  a copy of this report. I discussed the margins as well as lymph node surgeries. We also discussed the final staging along with previously performed ER/PR and HER-2/neu testing.  Recommendation: 1. Oncotype DX testing to determine if chemotherapy would be of any benefit followed by 2. Adjuvant radiation therapy followed by 3. Adjuvant antiestrogen therapy  RTC based on Oncotype score results  I spent 25 minutes talking to the patient of which more than half was spent in counseling and coordination of care.  No  orders of the defined types were placed in this encounter.  The patient has a good understanding of the overall plan. she agrees with it. she will call with any problems that may develop before the next visit here.   Harriette Ohara, MD 11/08/17

## 2017-11-09 ENCOUNTER — Telehealth: Payer: Self-pay | Admitting: *Deleted

## 2017-11-09 NOTE — Telephone Encounter (Signed)
Received results from [facility] Surgical Pathology Report; forwarded to provider/SLS 01/11

## 2017-11-12 ENCOUNTER — Ambulatory Visit: Payer: Medicare Other | Admitting: Hematology and Oncology

## 2017-11-19 ENCOUNTER — Other Ambulatory Visit: Payer: Self-pay | Admitting: Internal Medicine

## 2017-11-21 ENCOUNTER — Telehealth: Payer: Self-pay | Admitting: *Deleted

## 2017-11-21 DIAGNOSIS — Z170 Estrogen receptor positive status [ER+]: Principal | ICD-10-CM

## 2017-11-21 DIAGNOSIS — C50312 Malignant neoplasm of lower-inner quadrant of left female breast: Secondary | ICD-10-CM

## 2017-11-21 NOTE — Telephone Encounter (Signed)
Received oncotype score of 16/4%. Physician team notified. Called pt with results. Discussed low score and did not need chemotherapy. Informed next step is xrt. Received verbal understanding. Referral placed for Dr. Lisbeth Renshaw. Denies further questions or needs. Contact information provided.

## 2017-11-22 ENCOUNTER — Encounter (HOSPITAL_COMMUNITY): Payer: Self-pay

## 2017-11-23 ENCOUNTER — Encounter: Payer: Self-pay | Admitting: Radiation Oncology

## 2017-11-26 NOTE — Progress Notes (Signed)
Location of Breast Cancer: Left Breast  Histology per Pathology Report:  11/02/2017 Diagnosis 1. 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 - INTERMEDIATE GRADE DUCTAL CARCINOMA IN SITU WITH CALCIFICATIONS. - RESECTION MARGINS ARE NEGATIVE. - BIOPSY SITE. - SEE ONCOLOGY TABLE. 2. Breast, excision, Left superior margin - BENIGN BREAST TISSUE. 3. Breast, excision, Left medial margin - BENIGN BREAST TISSUE. 4. Breast, excision, Left posterior margin - BENIGN BREAST TISSUE. 5. Lymph node, sentinel, biopsy, Left - ONE OF ONE LYMPH NODE NEGATIVE FOR CARCINOMA (0/1). 6. Lymph node, sentinel, biopsy, Left - ONE OF ONE LYMPH NODE NEGATIVE FOR CARCINOMA (0/1). 7. Lymph node, sentinel, biopsy, Left - ONE OF ONE LYMPH NODE NEGATIVE FOR CARCINOMA (0/1). 8. Lymph node, sentinel, biopsy, Left - BENIGN FIBROADIPOSE TISSUE.   Receptor Status: ER(100% POS), PR (2% POS), Her2-neu ( NEG), Ki-( 20%)  Did patient present with symptoms or was this found on screening mammography?: It was found on a screening mammogram.  Past/Anticipated interventions by surgeon, if any: 11/02/2017 Dr.Haywood Ingram LEFT BREAST LUMPECTOMY WITH RADIOACTIVE SEED AND LEFT AXILLARY DEEP SENTINEL LYMPH NODE BIOPSY, INJECT BLUE DYE LEFT BREAST  Past/Anticipated interventions by medical oncology, if any: 11/08/2017  Dr. Gudena Recommendation: 1. Oncotype DX testing to determine if chemotherapy would be of any benefit followed by (per note 11/21/17 she will not receive chemotherapy) 2. Adjuvant radiation therapy followed by 3. Adjuvant antiestrogen therapy  Lymphedema issues, if any:  She has slight edema at her incision site per her report.   Pain issues, if any:  She denies.   SAFETY ISSUES:  Prior radiation? No  Pacemaker/ICD? No  Possible current pregnancy? No  Is the patient on methotrexate? No  Current Complaints / other details:  Patients sister and cousin has breast  cancer   BP 128/65   Pulse 71   Temp 97.8 F (36.6 C)   Ht 5' 4" (1.626 m)   Wt 154 lb 9.6 oz (70.1 kg)   SpO2 98% Comment: room air  BMI 26.54 kg/m    Wt Readings from Last 3 Encounters:  11/28/17 154 lb 9.6 oz (70.1 kg)  11/08/17 155 lb 4.8 oz (70.4 kg)  11/02/17 153 lb (69.4 kg)     Tara Gordon, LPN 11/26/2017,10:34 AM    

## 2017-11-28 ENCOUNTER — Ambulatory Visit
Admission: RE | Admit: 2017-11-28 | Discharge: 2017-11-28 | Disposition: A | Discharge status: Home / Self Care | Payer: Medicare Other | Source: Ambulatory Visit | Attending: Radiation Oncology | Admitting: Radiation Oncology

## 2017-11-28 ENCOUNTER — Encounter: Payer: Self-pay | Admitting: Radiation Oncology

## 2017-11-28 VITALS — BP 128/65 | HR 71 | Temp 97.8°F | Ht 64.0 in | Wt 154.6 lb

## 2017-11-28 DIAGNOSIS — Z170 Estrogen receptor positive status [ER+]: Principal | ICD-10-CM

## 2017-11-28 DIAGNOSIS — C50312 Malignant neoplasm of lower-inner quadrant of left female breast: Secondary | ICD-10-CM

## 2017-11-28 NOTE — Progress Notes (Signed)
Radiation Oncology         [phone] ________________________________  Name: Cynthia Thornton        MRN: [id number]  Date of Service: 11/28/2017 DOB: Apr 13, 1946  CC:[name], [name], MD  Nicholas Lose, MD     REFERRING PHYSICIAN: Nicholas Lose, MD   DIAGNOSIS: The encounter diagnosis was Malignant neoplasm of lower-inner quadrant of left breast in female, estrogen receptor positive ([place]).   HISTORY OF PRESENT ILLNESS: Cynthia Thornton is a 72 y.o. female seen in the multidisciplinary breast clinic for a new diagnosis of left breast cancer. The patient was noted to have screening detected calcificatiions of the left breast. She underwent diagnostic imaging which revealed a 1.1 cm span of clcifications at 7:00 in the lower inner quadrant. The axilla was negative for adenopathy by ultrasound. She had a biopsy of the breast on 09/26/17 which revealed a grade 2 invasive ductal carcinoma with DCIS, ER/PR positive, HER2 negative, and a Ki67 of 20%.   She underwent lumpectomy with sentinel node biopsy on 11/02/17.  Final pathology revealed a 1.2 cm grade 2 invasive ductal carcinoma with intermediate DCIS with calcifications.  Resection margins were negative, and the closest distance to the DCIS margin was 3 mm, 3 of the lymph nodes sampled were negative for carcinoma.  Her Oncotype score was 16, and she is not planning to proceed with chemotherapy.  She comes today to discuss recommendations for adjuvant radiotherapy.    PREVIOUS RADIATION THERAPY: No   PAST MEDICAL HISTORY:  Past Medical History:  Diagnosis Date  . Arthritis   . DUB (dysfunctional uterine bleeding)   . Esophageal reflux 08/15/2006   EGD(Chronic)  . Esophageal stricture 08/15/2006   EGD  . Family history of breast cancer   . Family history of colon cancer   . Hiatal hernia 08/15/2006   EGD  . Hyperthyroidism 2010   no meds now  . Insomnia   . Osteopenia 02/2016   T score -1.9 FRAX 11%/1.7%  . Personal history of colonic  polyps 09/06/2001   hyperplastic  . Pityriasis rosea   . PONV (postoperative nausea and vomiting)   . Primary osteoarthritis of left knee    Severe Patellofemoral arthritis  . Rhomboid pain    Right and right trapezius with concomitant cervical spondylosis       PAST SURGICAL HISTORY: Past Surgical History:  Procedure Laterality Date  . BREAST LUMPECTOMY WITH RADIOACTIVE SEED AND SENTINEL LYMPH NODE BIOPSY Left 11/02/2017   Procedure: LEFT BREAST LUMPECTOMY WITH RADIOACTIVE SEED AND LEFT AXILLARY DEEP SENTINEL LYMPH NODE BIOPSY, INJECT BLUE DYE LEFT BREAST;  Surgeon: [name], MD;  Location: [place];  Service: General;  Laterality: Left;  . BREAST SURGERY  2000   Breast cyst removed, left  . TONSILLECTOMY    . VAGINAL HYSTERECTOMY  1991   Right ovarian cystectomy  . VIDEO BRONCHOSCOPY Bilateral 03/16/2015   Procedure: VIDEO BRONCHOSCOPY WITHOUT FLUORO;  Surgeon: Brand Males, MD;  Location: [facility] ENDOSCOPY;  Service: Endoscopy;  Laterality: Bilateral;     FAMILY HISTORY:  Family History  Problem Relation Age of Onset  . Osteoporosis Mother   . Lung cancer Mother   . Stroke Mother   . Diabetes Father   . Hypertension Father   . Osteoporosis Father   . Colon cancer Father 66  . Breast cancer Sister        Age 49  . Ovarian cancer Maternal Aunt 60  . Breast cancer Cousin  maternal-Age 29  . Colon cancer Paternal Uncle 36  . Colon cancer Cousin        maternal first cousin  . Breast cancer Maternal Aunt        great aunt- Age unknown  . Crohn's disease Son      SOCIAL HISTORY:  reports that she quit smoking about 22 years ago. she has never used smokeless tobacco. She reports that she drinks alcohol. She reports that she does not use drugs. 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 She is retiring soon.  ALLERGIES: Codeine and Propranolol hcl   MEDICATIONS:  Current  Outpatient Medications  Medication Sig Dispense Refill  . Biotin 10000 MCG TABS Take by mouth daily.    . Cholecalciferol (VITAMIN D) 2000 units CAPS Take 2,000 Units by mouth daily.    . cyanocobalamin 1000 MCG tablet Take 1,000 mcg by mouth daily.    . famotidine (PEPCID) 20 MG tablet Take 1 tablet by mouth every evening as needed MUST HAVE OFFICE VISIT FOR FURTHER REFILLS 90 tablet 0  . omeprazole (PRILOSEC) 40 MG capsule Take 1 capsule (40 mg total) by mouth every morning. MUST HAVE OFFICE VISIT FOR FURTHER REFILLS 90 capsule 0  . aspirin EC 81 MG tablet Take 81 mg by mouth daily.    . clorazepate (TRANXENE) 7.5 MG tablet Take 7.5 mg by mouth at bedtime as needed for anxiety.     No current facility-administered medications for this encounter.      REVIEW OF SYSTEMS: On review of systems, the patient reports that she is doing well overall. She denies any chest pain, shortness of breath, cough, fevers, chills, night sweats, unintended weight changes. She denies any bowel or bladder disturbances, and denies abdominal pain, nausea or vomiting. She denies any new musculoskeletal or joint aches or pains. A complete review of systems is obtained and is otherwise negative.     PHYSICAL EXAM:  Wt Readings from Last 3 Encounters:  11/28/17 154 lb 9.6 oz (70.1 kg)  11/08/17 155 lb 4.8 oz (70.4 kg)  11/02/17 153 lb (69.4 kg)   Temp Readings from Last 3 Encounters:  11/28/17 97.8 F (36.6 C)  11/08/17 98.3 F (36.8 C) (Oral)  11/02/17 97.8 F (36.6 C)   BP Readings from Last 3 Encounters:  11/28/17 128/65  11/08/17 (!) 143/73  11/02/17 124/66   Pulse Readings from Last 3 Encounters:  11/28/17 71  11/08/17 72  11/02/17 70     In general this is a well appearing caucasian  female in no acute distress. She is alert and oriented x4 and appropriate throughout the examination. HEENT reveals that the patient is normocephalic, atraumatic. EOMs are intact. PERRLA. Skin is intact without  any evidence of gross lesions.Cardiopulmonary assessment is negative for acute distress and she exhibits normal effort. The left breast reveals a well healed lumpectomy scar without induration or cellulitic changes.     ECOG = 0  0 - Asymptomatic (Fully active, able to carry on all predisease activities without restriction)  1 - Symptomatic but completely ambulatory (Restricted in physically strenuous activity but ambulatory and able to carry out work of a light or sedentary nature. For example, light housework, office work)  2 - Symptomatic, <50% in bed during the day (Ambulatory and capable of all self care but unable to carry out any work activities. Up and about more than 50% of waking hours)  3 - Symptomatic, >  50% in bed, but not bedbound (Capable of only limited self-care, confined to bed or chair 50% or more of waking hours)  4 - Bedbound (Completely disabled. Cannot carry on any self-care. Totally confined to bed or chair)  5 - Death   Eustace Pen MM, Creech RH, Tormey DC, et al. [phone]). "Toxicity and response criteria of the [facility] Group". [place] Oncol. 5 (6): 649-55    LABORATORY DATA:  Lab Results  Component Value Date   WBC 6.2 10/03/2017   HGB 13.0 10/03/2017   HCT 37.6 10/03/2017   MCV 88.3 10/03/2017   PLT 305 10/03/2017   Lab Results  Component Value Date   NA 134 (L) 10/03/2017   K 4.3 10/03/2017   CL 100 12/08/2016   CO2 23 10/03/2017   Lab Results  Component Value Date   ALT 17 10/03/2017   AST 21 10/03/2017   ALKPHOS 107 10/03/2017   BILITOT 0.44 10/03/2017      RADIOGRAPHY: Nm Sentinel Node Inj-no Rpt (breast)  Result Date: 11/02/2017 Sulfur colloid was injected by the nuclear medicine technologist for melanoma sentinel node.       IMPRESSION/PLAN: 1. 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 We reviewed the findings on final pathology and the rationale for adjuvant  therapy. We discussed the risks, benefits, short, and long term effects of radiotherapy, and the patient is interested in proceeding. Dr. Lisbeth Renshaw discusses the delivery and logistics of radiotherapy and anticipates a course of 4 weeks with deep inspiration breath hold technique. Written consent is obtained and placed in the chart, a copy was provided to the patient. Our staff will contact her to schedule simulation .   In a visit lasting 25 minutes, greater than 50% of the time was spent face to face discussing her pathology, and coordinating the patient's care.   The above documentation reflects my direct findings during this shared patient visit. Please see the separate note by Dr. Lisbeth Renshaw on this date for the remainder of the patient's plan of care.    Carola Rhine, PAC

## 2017-11-29 NOTE — Addendum Note (Signed)
Encounter addended by: Hayden Pedro, PA-C on: 11/29/2017 6:18 AM  Actions taken: Follow-up modified

## 2017-12-03 ENCOUNTER — Encounter: Payer: Self-pay | Admitting: Physical Therapy

## 2017-12-03 ENCOUNTER — Ambulatory Visit: Payer: Medicare Other | Attending: General Surgery | Admitting: Physical Therapy

## 2017-12-03 DIAGNOSIS — C50312 Malignant neoplasm of lower-inner quadrant of left female breast: Secondary | ICD-10-CM | POA: Insufficient documentation

## 2017-12-03 DIAGNOSIS — Z170 Estrogen receptor positive status [ER+]: Secondary | ICD-10-CM

## 2017-12-03 DIAGNOSIS — Z483 Aftercare following surgery for neoplasm: Secondary | ICD-10-CM | POA: Diagnosis present

## 2017-12-03 DIAGNOSIS — R293 Abnormal posture: Secondary | ICD-10-CM | POA: Diagnosis present

## 2017-12-03 NOTE — Therapy (Signed)
[place], [place], [address] Phone: [phone]   Fax:  [phone]  Physical Therapy Treatment  Patient Details  Name: Cynthia Thornton MRN: [id number] Date of Birth: [birth year] Referring Provider: Dr. [name]   Encounter Date: 12/03/2017  PT End of Session - 12/03/17 1340    Visit Number  2    Number of Visits  2    PT Start Time  1250    PT Stop Time  1339    PT Time Calculation (min)  49 min    Activity Tolerance  Patient tolerated treatment well    Behavior During Therapy  [facility] for tasks assessed/performed       Past Medical History:  Diagnosis Date  . Arthritis   . DUB (dysfunctional uterine bleeding)   . Esophageal reflux 08/15/2006   EGD(Chronic)  . Esophageal stricture 08/15/2006   EGD  . Family history of breast cancer   . Family history of colon cancer   . Hiatal hernia 08/15/2006   EGD  . Hyperthyroidism 2010   no meds now  . Insomnia   . Osteopenia 02/2016   T score -1.9 FRAX 11%/1.7%  . Personal history of colonic polyps 09/06/2001   hyperplastic  . Pityriasis rosea   . PONV (postoperative nausea and vomiting)   . Primary osteoarthritis of left knee    Severe Patellofemoral arthritis  . 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 BREAST LUMPECTOMY WITH RADIOACTIVE SEED AND SENTINEL LYMPH NODE BIOPSY Left 11/02/2017   Procedure: LEFT BREAST LUMPECTOMY WITH RADIOACTIVE SEED AND LEFT AXILLARY DEEP SENTINEL LYMPH NODE BIOPSY, INJECT BLUE DYE LEFT BREAST;  Surgeon: [name], MD;  Location: [place];  Service: General;  Laterality: Left;  . BREAST SURGERY  2000   Breast cyst removed, left  . TONSILLECTOMY    . VAGINAL HYSTERECTOMY  1991   Right ovarian cystectomy  . VIDEO BRONCHOSCOPY Bilateral 03/16/2015   Procedure: VIDEO BRONCHOSCOPY WITHOUT FLUORO;   Surgeon: Brand Males, MD;  Location: [facility] ENDOSCOPY;  Service: Endoscopy;  Laterality: Bilateral;    There were no vitals filed for this visit.  Subjective Assessment - 12/03/17 1252    Subjective  Patient is here today for a f/u visit after her left breast lumpectomy and sentinel node biopsy on 11/02/17. She had Oncotype testing done and does not need chemotherapy. She will have radiation simulation 12/04/17.     Pertinent History  Patient was diagnosed on 09/14/17 with left grade 2 invasive ductal carcinoma with DCIS. It measured 1.1 cm and is located in the lower inner quadrant. It is ER/PR positive and HER2 negative with a Ki67 of 20%. She also has severe scoliosis which she gets physical therapy for and needs a left total knee replacement. She underwent a left lumpectomy and sentinel node biopsy on 11/02/17 and will begin radiation this week.    Patient Stated Goals  Make sure my arm is ok    Currently in Pain?  Yes    Pain Score  1     Pain Location  Knee    Pain Orientation  Left;Right    Pain Descriptors / Indicators  Aching    Pain Onset  More than a month ago    Pain Frequency  Intermittent    Aggravating Factors   Walking    Pain Relieving Factors  resting  Multiple Pain Sites  No         [facility] PT Assessment - 12/03/17 0001      Assessment   Medical Diagnosis  s/p left lumpectomy and SLNB    Referring Provider  Dr. 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    Leisure  She is not exercising      Cognition   Overall Cognitive Status  Within Functional Limits for tasks assessed      Posture/Postural Control   Posture/Postural Control  Postural limitations    Postural Limitations  Rounded Shoulders;Forward head      ROM / Strength   AROM / PROM / Strength  AROM      AROM   AROM Assessment Site  Shoulder    Right/Left Shoulder  Left    Left Shoulder Extension  52 Degrees    Left Shoulder Flexion  165 Degrees    Left Shoulder ABduction  160 Degrees    Left Shoulder Internal Rotation  79 Degrees    Left Shoulder External Rotation  73 Degrees      Strength   Overall Strength  Within functional limits for tasks performed      Palpation   Palpation comment  Incisions at left axilla and breast appear both well healed without c/o pain or tenderness.        LYMPHEDEMA/ONCOLOGY QUESTIONNAIRE - 12/03/17 1306      Type   Cancer Type  s/p left lumpectomy and SLNB      Surgeries   Lumpectomy Date  11/02/17    Sentinel Lymph Node Biopsy Date  11/02/17    Number Lymph Nodes Removed  3      Treatment   Active Chemotherapy Treatment  No    Past Chemotherapy Treatment  No    Active Radiation Treatment  No Begins 12/04/17    Past Radiation Treatment  No    Current Hormone Treatment  No    Past Hormone Therapy  No      What other symptoms do you have   Are you Having Heaviness or Tightness  No    Are you having Pain  No    Are you having pitting edema  No    Is it Hard or Difficult finding clothes that fit  No    Do you have infections  No    Is there Decreased scar mobility  Yes    Stemmer Sign  No      Lymphedema Assessments   Lymphedema Assessments  Upper extremities      Right Upper Extremity Lymphedema   10 cm Proximal to Olecranon Process  29.8 cm    Olecranon Process  24.1 cm    10 cm Proximal to Ulnar Styloid Process  20.1 cm  Just Proximal to Ulnar Styloid Process  14.4 cm    Across Hand at [employer]  17.6 cm    At [place] of 2nd Digit  6 cm      Left Upper Extremity Lymphedema   10 cm Proximal to Olecranon Process  29.7 cm    Olecranon Process  24.2 cm    10 cm Proximal to Ulnar Styloid Process  21.4 cm    Just Proximal to Ulnar Styloid Process  14 cm    Across Hand at [employer]  17.6 cm    At [place] of 2nd Digit  5.9 cm        Quick Dash - 12/03/17 0001    Open a tight or new jar  No difficulty    Do heavy household chores (wash walls, wash floors)  No difficulty    Carry a shopping bag or briefcase  No difficulty    Wash your back  No difficulty    Use a knife to cut food  No difficulty    Recreational activities in which you take some force or impact through your arm, shoulder, or hand (golf, hammering, tennis)  No difficulty    During the past week, to what extent has your arm, shoulder or hand problem interfered with your normal social activities with family, friends, neighbors, or groups?  Not at all    During the past week, to what extent has your arm, shoulder or hand problem limited your work or other regular daily activities  Not at all    Arm, shoulder, or hand pain.  None    Tingling (pins and needles) in your arm, shoulder, or hand  None    Difficulty Sleeping  No difficulty    DASH Score  0 %                    PT Education - 12/03/17 1339    Education provided  Yes    Education Details  Lymphedema risk reduction; issued ABC class packet as pt is unable to attend class due to work. Educated pt on using aloe or Vit E lotion on incision site.    Person(s) Educated  Patient    Methods  Explanation;Handout    Comprehension  Verbalized understanding          PT Long Term Goals - 12/03/17 1342      PT LONG TERM GOAL #1   Title  Patient will demonstrate she has returned to baseline related to shoulder ROM and function post operatively.    Time  8    Period  Weeks    Status  Achieved      Breast Clinic Goals - 10/03/17 1415       Patient will be able to verbalize understanding of pertinent lymphedema risk reduction practices relevant to her diagnosis specifically related to skin care.   Time  1    Period  Days    Status  Achieved      Patient will be able to return demonstrate and/or verbalize understanding of the post-op home exercise program related to regaining shoulder range of motion.   Time  1    Period  Days    Status  Achieved      Patient will be able to verbalize understanding of the importance of attending the postoperative After Breast Cancer Class for further lymphedema risk reduction education and therapeutic exercise.   Time  1    Period  Days    Status  Achieved           Plan - 12/03/17 1340    Clinical Impression Statement  Patient is doing very well following her left lumpectomy and SLNB. She has returned to work and all baseline activites. Her left shoulder function and ROM has returned to baseline and she has no measurable sign of lymphedema. She has no further need for PT at this time.    PT Treatment/Interventions  ADLs/Self Care Home Management;Patient/family education;Therapeutic exercise    PT Next Visit Plan  D/C; goals met    PT Home Exercise Plan  Post op shoulder ROM HEP    Consulted and Agree with Plan of Care  Patient       Patient will benefit from skilled therapeutic intervention in order to improve the following deficits and impairments:     Visit Diagnosis: Malignant neoplasm of lower-inner quadrant of left breast in female, estrogen receptor positive ([facility])  Abnormal posture  Aftercare following surgery for neoplasm     Problem List Patient Active Problem List   Diagnosis Date Noted  . Malignant neoplasm of lower-inner quadrant of left breast in female, estrogen receptor positive ([place]) 10/02/2017  . Genetic testing 03/03/2016  . Family history of breast cancer   . Family history of colon cancer   . Annual physical exam 07/27/2015  . PCP NOTES >>>>>>>  07/27/2015  . Smoking history 01/15/2015  . Family hx of colon cancer 09/10/2014  . Change in bowel habits 09/10/2014  . Bronchiectasis without complication ([facility]) 20/23/3435  . Hemoptysis 06/29/2014  . Bilateral radicular pain 08/02/2012  . Bronchiectasis ([place]) 07/28/2012  . Cough and hemoptysis 05/09/2012  . Osteopenia   . DUB (dysfunctional uterine bleeding)   . Arthritis   . Hypercholesteremia 10/03/2010  . GOITER, MULTINODULAR 11/27/2008  . UNSPECIFIED ANEMIA 11/02/2008  . Thyrotoxicosis 10/22/2008  . COLONIC POLYPS, BENIGN 01/16/2008  . DEGENERATIVE JOINT DISEASE 01/16/2008  . ESOPHAGEAL STRICTURE 04/05/2007  . Headache(784.0) 04/05/2007  . HIATAL HERNIA 08/15/2006  . GERD 07/30/2006   PHYSICAL THERAPY DISCHARGE SUMMARY  Visits from Start of Care: 2  Current functional level related to goals / functional outcomes: Goals met; see above   Remaining deficits: None   Education / Equipment: HEP and lymphedema risk reduction education  Plan: Patient agrees to discharge.  Patient goals were met. Patient is being discharged due to meeting the stated rehab goals.  ?????         Annia Friendly, [place] 12/03/17 1:46 PM  [place], [place], [address] Phone: [phone]   Fax:  [phone]  Name: SIYA FLURRY MRN: [id number] Date of Birth: 01-Sep-1946

## 2017-12-04 ENCOUNTER — Ambulatory Visit
Admission: RE | Admit: 2017-12-04 | Discharge: 2017-12-04 | Disposition: A | Discharge status: Home / Self Care | Payer: Medicare Other | Source: Ambulatory Visit | Attending: Radiation Oncology | Admitting: Radiation Oncology

## 2017-12-04 DIAGNOSIS — C50312 Malignant neoplasm of lower-inner quadrant of left female breast: Secondary | ICD-10-CM | POA: Diagnosis not present

## 2017-12-04 DIAGNOSIS — Z510 Encounter for antineoplastic radiation therapy: Secondary | ICD-10-CM | POA: Insufficient documentation

## 2017-12-04 DIAGNOSIS — Z170 Estrogen receptor positive status [ER+]: Secondary | ICD-10-CM | POA: Diagnosis not present

## 2017-12-04 NOTE — Progress Notes (Signed)
  Radiation Oncology         ([phone] ________________________________  Name: Cynthia Thornton MRN: [id number]  Date: 12/04/2017  DOB: Apr 09, 1946  Optical Surface Tracking Plan:  Since intensity modulated radiotherapy (IMRT) and 3D conformal radiation treatment methods are predicated on accurate and precise positioning for treatment, intrafraction motion monitoring is medically necessary to ensure accurate and safe treatment delivery.  The ability to quantify intrafraction motion without excessive ionizing radiation dose can only be performed with optical surface tracking. Accordingly, surface imaging offers the opportunity to obtain 3D measurements of patient position throughout IMRT and 3D treatments without excessive radiation exposure.  I am ordering optical surface tracking for this patient's upcoming course of radiotherapy. ________________________________  Kyung Rudd, MD 12/04/2017 5:53 PM    Reference:   Particia Jasper, et al. Surface imaging-based analysis of intrafraction motion for breast radiotherapy patients.Journal of [place], n. 6, nov. 2014. ISSN [id number].   Available at: <http://www.jacmp.org/index.php/jacmp/article/view/4957>.

## 2017-12-04 NOTE — Progress Notes (Signed)
  Radiation Oncology         [phone]) [phone] ________________________________  Name: Cynthia Thornton MRN: [id number]  Date: 12/04/2017  DOB: [birth year]   DIAGNOSIS:     ICD-10-CM   1. Malignant neoplasm of lower-inner quadrant of left breast in female, estrogen receptor positive ([place]) C50.312    Z17.0     SIMULATION AND TREATMENT PLANNING NOTE  The patient presented for simulation prior to beginning her course of radiation treatment for her diagnosis of left-sided breast cancer. The patient was placed in a supine position on a breast board. A customized vac-lock bag was constructed and this complex treatment device will be used on a daily basis during her treatment. In this fashion, a CT scan was obtained through the chest area and an isocenter was placed near the chest wall within the breast.  The patient will be planned to receive a course of radiation initially to a dose of 42.5 Gy. This will consist of a whole breast radiotherapy technique. To accomplish this, 2 customized blocks have been designed which will correspond to medial and lateral whole breast tangent fields. This treatment will be accomplished at 2.5 Gy per fraction. A forward planning technique will also be evaluated to determine if this approach improves the plan. It is anticipated that the patient will then receive a 7.5 Gy boost to the seroma cavity which has been contoured. This will be accomplished at 2.5 Gy per fraction.   This initial treatment will consist of a 3-D conformal technique. The seroma has been contoured as the primary target structure. Additionally, dose volume histograms of both this target as well as the lungs and heart will also be evaluated. Such an approach is necessary to ensure that the target area is adequately covered while the nearby critical  normal structures are adequately spared.  Plan:  The final anticipated total dose therefore will correspond to 50 Gy.   Special treatment procedure was performed  today due to the extra time and effort required by myself to plan and prepare this patient for deep inspiration breath hold technique.  I have determined cardiac sparing to be of benefit to this patient to prevent long term cardiac damage due to radiation of the heart.  Bellows were placed on the patient's abdomen. To facilitate cardiac sparing, the patient was coached by the radiation therapists on breath hold techniques and breathing practice was performed. Practice waveforms were obtained. The patient was then scanned while maintaining breath hold in the treatment position.  This image was then transferred over to the imaging specialist. The imaging specialist then created a fusion of the free breathing and breath hold scans using the chest wall as the stable structure. I personally reviewed the fusion in axial, coronal and sagittal image planes.  Excellent cardiac sparing was obtained.  I felt the patient is an appropriate candidate for breath hold and the patient will be treated as such.  The image fusion was then reviewed with the patient to reinforce the necessity of reproducible breath hold.     _______________________________   Jodelle Gross, MD, PhD

## 2017-12-05 ENCOUNTER — Telehealth: Payer: Self-pay | Admitting: Hematology and Oncology

## 2017-12-05 NOTE — Telephone Encounter (Signed)
Mailed calendar of upcoming March appointments to patient.

## 2017-12-06 DIAGNOSIS — Z510 Encounter for antineoplastic radiation therapy: Secondary | ICD-10-CM | POA: Diagnosis not present

## 2017-12-11 NOTE — Progress Notes (Addendum)
Subjective:   TRENITY PHA is a 72 y.o. female who presents for Medicare Annual (Subsequent) preventive examination. Works at [employer] 25hrs/ wk.   Review of Systems: No ROS.  Medicare Wellness Visit. Additional risk factors are reflected in the social history.  Cardiac Risk Factors include: advanced age (>60men, >71 women);sedentary lifestyle Sleep patterns: Traxene occasionally for sleep. Sleep varies.  Home Safety/Smoke Alarms: Feels safe in home. Smoke alarms in place.  Living environment; residence and Firearm Safety: Lives alone. Seat Belt Safety/Bike Helmet: Wears seat belt.   Female:   Pap-  hysterectomy     Mammo- Last 09/26/17.      Dexa scan-   Last 03/13/16-osteopenia     CCS- last 11/02/14. Recall 5 yrs  Objective:     Vitals: BP 120/64 (BP Location: Right Arm, Patient Position: Sitting, Cuff Size: Normal)   Pulse 68   Wt 157 lb 6.4 oz (71.4 kg)   SpO2 98%   BMI 27.02 kg/m   Body mass index is 27.02 kg/m.  Advanced Directives 12/14/2017 11/28/2017 11/02/2017 10/25/2017 10/03/2017 12/08/2016 04/16/2016  Does Patient Have a Medical Advance Directive? Yes Yes No No Yes Yes Yes  Type of Paramedic of [place];Living will [place];Living will - - Living will;Healthcare Power of [place];Living will [place];Living will  Does patient want to make changes to medical advance directive? - - - - No - Patient declined - -  Copy of [place] in Chart? No - copy requested No - copy requested - - No - copy requested No - copy requested No - copy requested  Would patient like information on creating a medical advance directive? - - No - Patient declined Yes (MAU/Ambulatory/Procedural Areas - Information given) - - -    Tobacco Social History   Tobacco Use  Smoking Status Former Smoker  . Last attempt to quit: 10/31/1995  . Years since quitting: 22.1  Smokeless Tobacco Never  Used     Counseling given: Not Answered   Clinical Intake: Pain : No/denies pain Pain Score: 0-No pain   Past Medical History:  Diagnosis Date  . Arthritis   . Cancer ([place]) 09/27/2017   left breast  . DUB (dysfunctional uterine bleeding)   . Esophageal reflux 08/15/2006   EGD(Chronic)  . Esophageal stricture 08/15/2006   EGD  . Family history of breast cancer   . Family history of colon cancer   . Hiatal hernia 08/15/2006   EGD  . Hyperthyroidism 2010   no meds now  . Insomnia   . Osteopenia 02/2016   T score -1.9 FRAX 11%/1.7%  . Personal history of colonic polyps 09/06/2001   hyperplastic  . Pityriasis rosea   . PONV (postoperative nausea and vomiting)   . Primary osteoarthritis of left knee    Severe Patellofemoral arthritis  . 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 BREAST LUMPECTOMY WITH RADIOACTIVE SEED AND SENTINEL LYMPH NODE BIOPSY Left 11/02/2017   Procedure: LEFT BREAST LUMPECTOMY WITH RADIOACTIVE SEED AND LEFT AXILLARY DEEP SENTINEL LYMPH NODE BIOPSY, INJECT BLUE DYE LEFT BREAST;  Surgeon: [name], MD;  Location: [facility];  Service: General;  Laterality: Left;  . BREAST SURGERY  2000   Breast cyst removed, left  . TONSILLECTOMY    . VAGINAL HYSTERECTOMY  1991   Right ovarian cystectomy  .  VIDEO BRONCHOSCOPY Bilateral 03/16/2015   Procedure: VIDEO BRONCHOSCOPY WITHOUT FLUORO;  Surgeon: Brand Males, MD;  Location: [facility] ENDOSCOPY;  Service: Endoscopy;  Laterality: Bilateral;   Family History  Problem Relation Age of Onset  . Osteoporosis Mother   . Lung cancer Mother   . Stroke Mother   . Diabetes Father   . Hypertension Father   . Osteoporosis Father   . Colon cancer Father 20  . Breast cancer Sister        Age 49  . Ovarian cancer Maternal Aunt 60  . Breast cancer Cousin        maternal-Age 69  . Colon cancer Paternal Uncle  57  . Colon cancer Cousin        maternal first cousin  . Breast cancer Maternal Aunt        great aunt- Age unknown  . Crohn's disease Son    Social History   Socioeconomic History  . Marital status: Divorced    Spouse name: None  . Number of children: 2  . Years of education: None  . Highest education level: None  Social Needs  . Financial resource strain: None  . Food insecurity - worry: None  . Food insecurity - inability: None  . Transportation needs - medical: None  . Transportation needs - non-medical: None  Occupational History  . Occupation: Pharmacist, hospital, works part Financial trader: [employer]  Tobacco Use  . Smoking status: Former Smoker    Last attempt to quit: 10/31/1995    Years since quitting: 22.1  . Smokeless tobacco: Never Used  Substance and Sexual Activity  . Alcohol use: Yes    Alcohol/week: 0.0 oz    Comment: occ  . Drug use: No  . Sexual activity: Not Currently    Birth control/protection: Surgical    Comment: 1st intercourse 72 yo-Fewer than 5 partners  Other Topics Concern  . None  Social History Narrative   Lives by herself   2 children, one has mental issues     Outpatient Encounter Medications as of 12/14/2017  Medication Sig  . Biotin 10000 MCG TABS Take by mouth daily.  . Cholecalciferol (VITAMIN D) 2000 units CAPS Take 2,000 Units by mouth daily.  . cyanocobalamin 1000 MCG tablet Take 1,000 mcg by mouth daily.  . famotidine (PEPCID) 20 MG tablet Take 1 tablet by mouth every evening as needed MUST HAVE OFFICE VISIT FOR FURTHER REFILLS  . omeprazole (PRILOSEC) 40 MG capsule Take 1 capsule (40 mg total) by mouth every morning. MUST HAVE OFFICE VISIT FOR FURTHER REFILLS  . aspirin EC 81 MG tablet Take 81 mg by mouth daily.  . clorazepate (TRANXENE) 7.5 MG tablet Take 7.5 mg by mouth at bedtime as needed for anxiety.   No facility-administered encounter medications on file as of 12/14/2017.     Activities of Daily Living In your  present state of health, do you have any difficulty performing the following activities: 12/14/2017 11/02/2017  Hearing? N N  Vision? N N  Comment contacts. Dr.McCuen yearly. Watching cataracts. -  Difficulty concentrating or making decisions? N N  Walking or climbing stairs? N N  Comment Right knee pain -  Dressing or bathing? N N  Doing errands, shopping? N -  Preparing Food and eating ? N -  Using the Toilet? N -  In the past six months, have you accidently leaked urine? N -  Do you have problems with loss of bowel control?  N -  Managing your Medications? N -  Managing your Finances? N -  Housekeeping or managing your Housekeeping? N -  Some recent data might be hidden    Patient Care Team: Colon Branch, MD as PCP - Gaston Islam, MD as Consulting Physician (Orthopedic Surgery) Nicholas Lose, MD as Consulting Physician (Hematology and Oncology) [name], MD as Consulting Physician (General Surgery) Fontaine, Belinda Block, MD as Consulting Physician (Gynecology) Brand Males, MD as Consulting Physician (Pulmonary Disease) Kyung Rudd, MD as Consulting Physician (Radiation Oncology)    Assessment:   This is a routine wellness examination for Aleksia. Physical assessment deferred to PCP.   Exercise Activities and Dietary recommendations Current Exercise Habits: The patient does not participate in regular exercise at present, Exercise limited by: None identified   Diet (meal preparation, eat out, water intake, caffeinated beverages, dairy products, fruits and vegetables): in general, a "healthy" diet  , well balanced     Goals    . Begin exercising (pt-stated)       Fall Risk Fall Risk  12/14/2017 11/28/2017 12/08/2016 07/27/2015 04/02/2015  Falls in the past year? No No No No No    Depression Screen PHQ 2/9 Scores 12/14/2017 11/28/2017 12/08/2016 07/27/2015  PHQ - 2 Score 0 0 0 0     Cognitive Function Ad8 score reviewed for issues:  Issues making  decisions:no  Less interest in hobbies / activities:no  Repeats questions, stories (family complaining):no  Trouble using ordinary gadgets (microwave, computer, phone):no  Forgets the month or year: no  Mismanaging finances: no  Remembering appts:no  Daily problems with thinking and/or memory:no Ad8 score is=0  MMSE - Mini Mental State Exam 12/08/2016  Orientation to time 5  Orientation to Place 5  Registration 3  Attention/ Calculation 5  Recall 3  Language- name 2 objects 2  Language- repeat 1  Language- follow 3 step command 3  Language- read & follow direction 1  Write a sentence 1  Copy design 1  Total score 30        Immunization History  Administered Date(s) Administered  . Influenza Split 08/31/2011, 07/30/2014  . Influenza Whole 08/30/2009, 07/30/2010  . Influenza,inj,Quad PF,6+ Mos 09/30/2012, 10/10/2013, 07/26/2015  . Influenza-Unspecified 08/18/2016  . Pneumococcal Conjugate-13 10/10/2013  . Pneumococcal Polysaccharide-23 07/27/2015  . Td 12/02/2008  . Zoster 12/02/2008    Screening Tests Health Maintenance  Topic Date Due  . MAMMOGRAM  09/14/2018  . TETANUS/TDAP  12/02/2018  . COLONOSCOPY  11/03/2019  . INFLUENZA VACCINE  Completed  . DEXA SCAN  Completed  . Hepatitis C Screening  Completed  . PNA vac Low Risk Adult  Completed      Plan:   Follow up with Dr.[name] today as scheduled  Continue to eat heart healthy diet (full of fruits, vegetables, whole grains, lean protein, water--limit salt, fat, and sugar intake) and increase physical activity as tolerated.  Continue doing brain stimulating activities (puzzles, reading, adult coloring books, staying active) to keep memory sharp.    I have personally reviewed and noted the following in the patient's chart:   . Medical and social history . Use of alcohol, tobacco or illicit drugs  . Current medications and supplements . Functional ability and status . Nutritional status . Physical  activity . Advanced directives . List of other physicians . Hospitalizations, surgeries, and ER visits in previous 12 months . Vitals . Screenings to include cognitive, depression, and falls . Referrals and appointments  In addition, I have  reviewed and discussed with patient certain preventive protocols, quality metrics, and best practice recommendations. A written personalized care plan for preventive services as well as general preventive health recommendations were provided to patient.     Naaman Plummer [place], [place]  12/14/2017   Kathlene November, MD

## 2017-12-12 ENCOUNTER — Ambulatory Visit
Admission: RE | Admit: 2017-12-12 | Discharge: 2017-12-12 | Disposition: A | Discharge status: Home / Self Care | Payer: Medicare Other | Source: Ambulatory Visit | Attending: Radiation Oncology | Admitting: Radiation Oncology

## 2017-12-12 DIAGNOSIS — Z510 Encounter for antineoplastic radiation therapy: Secondary | ICD-10-CM | POA: Diagnosis not present

## 2017-12-13 ENCOUNTER — Ambulatory Visit
Admission: RE | Admit: 2017-12-13 | Discharge: 2017-12-13 | Disposition: A | Discharge status: Home / Self Care | Payer: Medicare Other | Source: Ambulatory Visit | Attending: Radiation Oncology | Admitting: Radiation Oncology

## 2017-12-13 DIAGNOSIS — Z510 Encounter for antineoplastic radiation therapy: Secondary | ICD-10-CM | POA: Diagnosis not present

## 2017-12-14 ENCOUNTER — Encounter: Payer: Self-pay | Admitting: *Deleted

## 2017-12-14 ENCOUNTER — Ambulatory Visit
Admission: RE | Admit: 2017-12-14 | Discharge: 2017-12-14 | Disposition: A | Discharge status: Home / Self Care | Payer: Medicare Other | Source: Ambulatory Visit | Attending: Radiation Oncology | Admitting: Radiation Oncology

## 2017-12-14 ENCOUNTER — Encounter: Payer: Self-pay | Admitting: Internal Medicine

## 2017-12-14 ENCOUNTER — Ambulatory Visit (INDEPENDENT_AMBULATORY_CARE_PROVIDER_SITE_OTHER): Payer: Medicare Other | Admitting: *Deleted

## 2017-12-14 ENCOUNTER — Ambulatory Visit: Payer: Medicare Other | Admitting: Internal Medicine

## 2017-12-14 VITALS — BP 120/64 | HR 68 | Resp 14 | Ht 64.0 in | Wt 157.4 lb

## 2017-12-14 VITALS — BP 120/64 | HR 68 | Wt 157.4 lb

## 2017-12-14 DIAGNOSIS — C50312 Malignant neoplasm of lower-inner quadrant of left female breast: Secondary | ICD-10-CM

## 2017-12-14 DIAGNOSIS — Z170 Estrogen receptor positive status [ER+]: Principal | ICD-10-CM

## 2017-12-14 DIAGNOSIS — Z0000 Encounter for general adult medical examination without abnormal findings: Secondary | ICD-10-CM | POA: Diagnosis not present

## 2017-12-14 DIAGNOSIS — E042 Nontoxic multinodular goiter: Secondary | ICD-10-CM

## 2017-12-14 DIAGNOSIS — Z510 Encounter for antineoplastic radiation therapy: Secondary | ICD-10-CM | POA: Diagnosis not present

## 2017-12-14 LAB — LIPID PANEL
CHOL/HDL RATIO: 2
Cholesterol: 210 mg/dL — ABNORMAL HIGH (ref 0–200)
HDL: 88 mg/dL (ref >39.00)
LDL Cholesterol: 103 mg/dL — ABNORMAL HIGH (ref 0–99)
NONHDL: 121.54
Triglycerides: 95 mg/dL (ref 0.0–149.0)
VLDL: 19 mg/dL (ref 0.0–40.0)

## 2017-12-14 LAB — T3, FREE: T3, Free: 3.7 pg/mL (ref 2.3–4.2)

## 2017-12-14 LAB — TSH: TSH: 4.87 u[IU]/mL — ABNORMAL HIGH (ref 0.35–4.50)

## 2017-12-14 LAB — T4, FREE: Free T4: 1.13 ng/dL (ref 0.60–1.60)

## 2017-12-14 MED ORDER — RADIAPLEXRX EX GEL
Freq: Two times a day (BID) | CUTANEOUS | Status: DC
  Administered 2017-12-14: 16:00:00 via TOPICAL

## 2017-12-14 MED ORDER — ALRA NON-METALLIC DEODORANT (RAD-ONC)
1.0000 "application " | Freq: Once | TOPICAL | Status: AC
  Administered 2017-12-14: 1 via TOPICAL

## 2017-12-14 NOTE — Progress Notes (Signed)
Pre visit review using our clinic review tool, if applicable. No additional management support is needed unless otherwise documented below in the visit note. 

## 2017-12-14 NOTE — Progress Notes (Signed)

## 2017-12-14 NOTE — Patient Instructions (Signed)
GO TO THE LAB : Get the blood work     GO TO THE FRONT DESK Schedule your next appointment   for a physical exam in 1 year 

## 2017-12-14 NOTE — Progress Notes (Signed)
Subjective:    Patient ID: Cynthia Thornton, female    DOB: [birth year], 72 y.o.   MRN: [id number]  DOS:  12/14/2017 Type of visit - description : cpx Interval history: Since the last office visit she was diagnosed with breast cancer.   Review of Systems  Emotionally doing well. Has cough in the morning with a sputum production but no recent hemoptysis, history of bronchiectasis. DJD: Continue with knee pain, currently not a major issue.  Other than above, a 14 point review of systems is negative    Past Medical History:  Diagnosis Date  . Arthritis   . Cancer ([place]) 09/27/2017   left breast  . DUB (dysfunctional uterine bleeding)   . Esophageal reflux 08/15/2006   EGD(Chronic)  . Esophageal stricture 08/15/2006   EGD  . Family history of breast cancer   . Family history of colon cancer   . Hiatal hernia 08/15/2006   EGD  . Hyperthyroidism 2010   no meds now  . Insomnia   . Osteopenia 02/2016   T score -1.9 FRAX 11%/1.7%  . Personal history of colonic polyps 09/06/2001   hyperplastic  . Pityriasis rosea   . PONV (postoperative nausea and vomiting)   . Primary osteoarthritis of left knee    Severe Patellofemoral arthritis  . 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 BREAST LUMPECTOMY WITH RADIOACTIVE SEED AND SENTINEL LYMPH NODE BIOPSY Left 11/02/2017   Procedure: LEFT BREAST LUMPECTOMY WITH RADIOACTIVE SEED AND LEFT AXILLARY DEEP SENTINEL LYMPH NODE BIOPSY, INJECT BLUE DYE LEFT BREAST;  Surgeon: [name], MD;  Location: [facility];  Service: General;  Laterality: Left;  . BREAST SURGERY  2000   Breast cyst removed, left  . TONSILLECTOMY    . VAGINAL HYSTERECTOMY  1991   Right ovarian cystectomy  . VIDEO BRONCHOSCOPY Bilateral 03/16/2015   Procedure: VIDEO BRONCHOSCOPY WITHOUT FLUORO;  Surgeon: Brand Males, MD;  Location: [facility] ENDOSCOPY;  Service:  Endoscopy;  Laterality: Bilateral;    Social History   Socioeconomic History  . Marital status: Divorced    Spouse name: Not on file  . Number of children: 2  . Years of education: Not on file  . Highest education level: Not on file  Social Needs  . Financial resource strain: Not on file  . Food insecurity - worry: Not on file  . Food insecurity - inability: Not on file  . Transportation needs - medical: Not on file  . Transportation needs - non-medical: Not on file  Occupational History  . Occupation: Pharmacist, hospital, works part Financial trader: [employer]  Tobacco Use  . Smoking status: Former Smoker    Last attempt to quit: 10/31/1995    Years since quitting: 22.1  . Smokeless tobacco: Never Used  Substance and Sexual Activity  . Alcohol use: Yes    Alcohol/week: 0.0 oz    Comment: occ  . Drug use: No  . Sexual activity: Not Currently    Birth control/protection: Surgical    Comment: 1st intercourse 72 yo-Fewer than 5 partners  Other Topics Concern  . Not on file  Social History Narrative   Lives by herself   2 children, one has mental issues      Family History  Problem Relation Age of Onset  . Osteoporosis Mother   . Lung cancer Mother   . Stroke  Mother   . Diabetes Father   . Hypertension Father   . Osteoporosis Father   . Colon cancer Father 80  . Breast cancer Sister        Age 28  . Ovarian cancer Maternal Aunt 60  . Breast cancer Cousin        maternal-Age 40  . Colon cancer Paternal Uncle 27  . Colon cancer Cousin        maternal first cousin  . Breast cancer Maternal Aunt        great aunt- Age unknown  . Crohn's disease Son      Allergies as of 12/14/2017      Reactions   Codeine Nausea Only   Propranolol Hcl Other (See Comments)   dizziness      Medication List        Accurate as of 12/14/17 11:59 PM. Always use your most recent med list.          aspirin EC 81 MG tablet Take 81 mg by mouth daily.   Biotin 10000 MCG  Tabs Take by mouth daily.   clorazepate 7.5 MG tablet Commonly known as:  TRANXENE Take 7.5 mg by mouth at bedtime as needed for anxiety.   cyanocobalamin 1000 MCG tablet Take 1,000 mcg by mouth daily.   famotidine 20 MG tablet Commonly known as:  PEPCID Take 1 tablet by mouth every evening as needed MUST HAVE OFFICE VISIT FOR FURTHER REFILLS   omeprazole 40 MG capsule Commonly known as:  PRILOSEC Take 1 capsule (40 mg total) by mouth every morning. MUST HAVE OFFICE VISIT FOR FURTHER REFILLS   Vitamin D 2000 units Caps Take 2,000 Units by mouth daily.          Objective:   Physical Exam BP 120/64 (BP Location: Right Arm, Patient Position: Sitting, Cuff Size: Normal)   Pulse 68   Resp 14   Ht 5\' 4"  (1.626 m)   Wt 157 lb 6 oz (71.4 kg)   SpO2 98%   BMI 27.01 kg/m  General:   Well developed, well nourished . NAD.  Neck: No  thyromegaly  HEENT:  Normocephalic . Face symmetric, atraumatic Lungs:  CTA B Normal respiratory effort, no intercostal retractions, no accessory muscle use. Heart: RRR,  no murmur.  No pretibial edema bilaterally  Abdomen:  Not distended, soft, non-tender. No rebound or rigidity.   Skin: Exposed areas without rash. Not pale. Not jaundice Neurologic:  alert & oriented X3.  Speech normal, gait appropriate for age and unassisted Strength symmetric and appropriate for age.  Psych: Cognition and judgment appear intact.  Cooperative with normal attention span and concentration.  Behavior appropriate. No anxious or depressed appearing.     Assessment & Plan:   Assessment  Bronchiectasis Dr Chase Caller Insomnia  Endocrinology Dr. Loanne Drilling q 3 years, Last visit 03/2015 --H/o thyrotoxicosis on remission --Goiter   Osteopenia --T score -1.5  (2015), last dexa [facility] 02-2016  DJD -- [facility] GI: GERD, HH and esophageal stricture EGD 2007, colon polyps Breast cancer, DX 08-2017 H/o DUB  Plan Bronchiectasis: Sxs at  baseline Goiter: Exam negative, checking TFTs Osteopenia: Per gynecology GERD:  controlled Breast cancer: Patient was told she has a very good prognosis, started XRT yesterday, reportedly  would do it for 4 weeks, she will also take anti estrogen meds for 5 years.  Patient is counseled with, she is actually doing very well emotionally. RTC 1 year

## 2017-12-14 NOTE — Patient Instructions (Signed)
Continue to eat heart healthy diet (full of fruits, vegetables, whole grains, lean protein, water--limit salt, fat, and sugar intake) and increase physical activity as tolerated.  Continue doing brain stimulating activities (puzzles, reading, adult coloring books, staying active) to keep memory sharp.    Cynthia Thornton , Thank you for taking time to come for your Medicare Wellness Visit. I appreciate your ongoing commitment to your health goals. Please review the following plan we discussed and let me know if I can assist you in the future.   These are the goals we discussed: Goals    . Begin exercising (pt-stated)       This is a list of the screening recommended for you and due dates:  Health Maintenance  Topic Date Due  . Mammogram  09/14/2018  . Tetanus Vaccine  12/02/2018  . Colon Cancer Screening  11/03/2019  . Flu Shot  Completed  . DEXA scan (bone density measurement)  Completed  .  Hepatitis C: One time screening is recommended by Center for Disease Control  (CDC) for  adults born from [birth year] through 1963.   Completed  . Pneumonia vaccines  Completed    Health Maintenance for Postmenopausal Women Menopause is a normal process in which your reproductive ability comes to an end. This process happens gradually over a span of months to years, usually between the ages of 69 and 53. Menopause is complete when you have missed 12 consecutive menstrual periods. It is important to talk with your health care provider about some of the most common conditions that affect postmenopausal women, such as heart disease, cancer, and bone loss (osteoporosis). Adopting a healthy lifestyle and getting preventive care can help to promote your health and wellness. Those actions can also lower your chances of developing some of these common conditions. What should I know about menopause? During menopause, you may experience a number of symptoms, such as:  Moderate-to-severe hot flashes.  Night  sweats.  Decrease in sex drive.  Mood swings.  Headaches.  Tiredness.  Irritability.  Memory problems.  Insomnia.  Choosing to treat or not to treat menopausal changes is an individual decision that you make with your health care provider. What should I know about hormone replacement therapy and supplements? Hormone therapy products are effective for treating symptoms that are associated with menopause, such as hot flashes and night sweats. Hormone replacement carries certain risks, especially as you become older. If you are thinking about using estrogen or estrogen with progestin treatments, discuss the benefits and risks with your health care provider. What should I know about heart disease and stroke? Heart disease, heart attack, and stroke become more likely as you age. This may be due, in part, to the hormonal changes that your body experiences during menopause. These can affect how your body processes dietary fats, triglycerides, and cholesterol. Heart attack and stroke are both medical emergencies. There are many things that you can do to help prevent heart disease and stroke:  Have your blood pressure checked at least every 1-2 years. High blood pressure causes heart disease and increases the risk of stroke.  If you are 31-23 years old, ask your health care provider if you should take aspirin to prevent a heart attack or a stroke.  Do not use any tobacco products, including cigarettes, chewing tobacco, or electronic cigarettes. If you need help quitting, ask your health care provider.  It is important to eat a healthy diet and maintain a healthy weight. ? Be sure to  include plenty of vegetables, fruits, low-fat dairy products, and lean protein. ? Avoid eating foods that are high in solid fats, added sugars, or salt (sodium).  Get regular exercise. This is one of the most important things that you can do for your health. ? Try to exercise for at least 150 minutes each week.  The type of exercise that you do should increase your heart rate and make you sweat. This is known as moderate-intensity exercise. ? Try to do strengthening exercises at least twice each week. Do these in addition to the moderate-intensity exercise.  Know your numbers.Ask your health care provider to check your cholesterol and your blood glucose. Continue to have your blood tested as directed by your health care provider.  What should I know about cancer screening? There are several types of cancer. Take the following steps to reduce your risk and to catch any cancer development as early as possible. Breast Cancer  Practice breast self-awareness. ? This means understanding how your breasts normally appear and feel. ? It also means doing regular breast self-exams. Let your health care provider know about any changes, no matter how small.  If you are 73 or older, have a clinician do a breast exam (clinical breast exam or CBE) every year. Depending on your age, family history, and medical history, it may be recommended that you also have a yearly breast X-ray (mammogram).  If you have a family history of breast cancer, talk with your health care provider about genetic screening.  If you are at high risk for breast cancer, talk with your health care provider about having an MRI and a mammogram every year.  Breast cancer (BRCA) gene test is recommended for women who have family members with BRCA-related cancers. Results of the assessment will determine the need for genetic counseling and BRCA1 and for BRCA2 testing. BRCA-related cancers include these types: ? Breast. This occurs in males or females. ? Ovarian. ? Tubal. This may also be called fallopian tube cancer. ? Cancer of the abdominal or pelvic lining (peritoneal cancer). ? Prostate. ? Pancreatic.  Cervical, Uterine, and Ovarian Cancer Your health care provider may recommend that you be screened regularly for cancer of the pelvic  organs. These include your ovaries, uterus, and vagina. This screening involves a pelvic exam, which includes checking for microscopic changes to the surface of your cervix (Pap test).  For women ages 21-65, health care providers may recommend a pelvic exam and a Pap test every three years. For women ages 74-65, they may recommend the Pap test and pelvic exam, combined with testing for human papilloma virus (HPV), every five years. Some types of HPV increase your risk of cervical cancer. Testing for HPV may also be done on women of any age who have unclear Pap test results.  Other health care providers may not recommend any screening for nonpregnant women who are considered low risk for pelvic cancer and have no symptoms. Ask your health care provider if a screening pelvic exam is right for you.  If you have had past treatment for cervical cancer or a condition that could lead to cancer, you need Pap tests and screening for cancer for at least 20 years after your treatment. If Pap tests have been discontinued for you, your risk factors (such as having a new sexual partner) need to be reassessed to determine if you should start having screenings again. Some women have medical problems that increase the chance of getting cervical cancer. In these cases,  your health care provider may recommend that you have screening and Pap tests more often.  If you have a family history of uterine cancer or ovarian cancer, talk with your health care provider about genetic screening.  If you have vaginal bleeding after reaching menopause, tell your health care provider.  There are currently no reliable tests available to screen for ovarian cancer.  Lung Cancer Lung cancer screening is recommended for adults 55-80 years old who are at high risk for lung cancer because of a history of smoking. A yearly low-dose CT scan of the lungs is recommended if you:  Currently smoke.  Have a history of at least 30 pack-years of  smoking and you currently smoke or have quit within the past 15 years. A pack-year is smoking an average of one pack of cigarettes per day for one year.  Yearly screening should:  Continue until it has been 15 years since you quit.  Stop if you develop a health problem that would prevent you from having lung cancer treatment.  Colorectal Cancer  This type of cancer can be detected and can often be prevented.  Routine colorectal cancer screening usually begins at age 50 and continues through age 75.  If you have risk factors for colon cancer, your health care provider may recommend that you be screened at an earlier age.  If you have a family history of colorectal cancer, talk with your health care provider about genetic screening.  Your health care provider may also recommend using home test kits to check for hidden blood in your stool.  A small camera at the end of a tube can be used to examine your colon directly (sigmoidoscopy or colonoscopy). This is done to check for the earliest forms of colorectal cancer.  Direct examination of the colon should be repeated every 5-10 years until age 75. However, if early forms of precancerous polyps or small growths are found or if you have a family history or genetic risk for colorectal cancer, you may need to be screened more often.  Skin Cancer  Check your skin from head to toe regularly.  Monitor any moles. Be sure to tell your health care provider: ? About any new moles or changes in moles, especially if there is a change in a mole's shape or color. ? If you have a mole that is larger than the size of a pencil eraser.  If any of your family members has a history of skin cancer, especially at a young age, talk with your health care provider about genetic screening.  Always use sunscreen. Apply sunscreen liberally and repeatedly throughout the day.  Whenever you are outside, protect yourself by wearing long sleeves, pants, a wide-brimmed  hat, and sunglasses.  What should I know about osteoporosis? Osteoporosis is a condition in which bone destruction happens more quickly than new bone creation. After menopause, you may be at an increased risk for osteoporosis. To help prevent osteoporosis or the bone fractures that can happen because of osteoporosis, the following is recommended:  If you are 19-50 years old, get at least 1,000 mg of calcium and at least 600 mg of vitamin D per day.  If you are older than age 50 but younger than age 70, get at least 1,200 mg of calcium and at least 600 mg of vitamin D per day.  If you are older than age 70, get at least 1,200 mg of calcium and at least 800 mg of vitamin D per day.    Smoking and excessive alcohol intake increase the risk of osteoporosis. Eat foods that are rich in calcium and vitamin D, and do weight-bearing exercises several times each week as directed by your health care provider. What should I know about how menopause affects my mental health? Depression may occur at any age, but it is more common as you become older. Common symptoms of depression include:  Low or sad mood.  Changes in sleep patterns.  Changes in appetite or eating patterns.  Feeling an overall lack of motivation or enjoyment of activities that you previously enjoyed.  Frequent crying spells.  Talk with your health care provider if you think that you are experiencing depression. What should I know about immunizations? It is important that you get and maintain your immunizations. These include:  Tetanus, diphtheria, and pertussis (Tdap) booster vaccine.  Influenza every year before the flu season begins.  Pneumonia vaccine.  Shingles vaccine.  Your health care provider may also recommend other immunizations. This information is not intended to replace advice given to you by your health care provider. Make sure you discuss any questions you have with your health care provider. Document Released:  12/08/2005 Document Revised: 05/05/2016 Document Reviewed: 07/20/2015 Elsevier Interactive Patient Education  2018 [employer]vier Inc.  

## 2017-12-14 NOTE — Assessment & Plan Note (Addendum)
-  Td 2010; zostavax 2010, prevanr 2014; pnm shot 23 : 07-2015; had a flu shot; shingrix not available  -CCS: Last colonoscopy 08-2015, + polyps, 5 years.  Female care: Sees gynecology - breast ca dx 08-2017 -Diet is healthy; exercise discussed  -Labs reviewed, check a FLP(fasting for 4 hours only) and TSH, T3-T4

## 2017-12-15 NOTE — Assessment & Plan Note (Signed)
Bronchiectasis: Sxs at baseline Goiter: Exam negative, checking TFTs Osteopenia: Per gynecology GERD:  controlled Breast cancer: Patient was told she has a very good prognosis, started XRT yesterday, reportedly  would do it for 4 weeks, she will also take anti estrogen meds for 5 years.  Patient is counseled with, she is actually doing very well emotionally. RTC 1 year

## 2017-12-17 ENCOUNTER — Ambulatory Visit
Admission: RE | Admit: 2017-12-17 | Discharge: 2017-12-17 | Disposition: A | Discharge status: Home / Self Care | Payer: Medicare Other | Source: Ambulatory Visit | Attending: Radiation Oncology | Admitting: Radiation Oncology

## 2017-12-17 DIAGNOSIS — Z510 Encounter for antineoplastic radiation therapy: Secondary | ICD-10-CM | POA: Diagnosis not present

## 2017-12-18 ENCOUNTER — Ambulatory Visit
Admission: RE | Admit: 2017-12-18 | Discharge: 2017-12-18 | Disposition: A | Discharge status: Home / Self Care | Payer: Medicare Other | Source: Ambulatory Visit | Attending: Radiation Oncology | Admitting: Radiation Oncology

## 2017-12-18 DIAGNOSIS — Z510 Encounter for antineoplastic radiation therapy: Secondary | ICD-10-CM | POA: Diagnosis not present

## 2017-12-19 ENCOUNTER — Ambulatory Visit
Admission: RE | Admit: 2017-12-19 | Discharge: 2017-12-19 | Disposition: A | Discharge status: Home / Self Care | Payer: Medicare Other | Source: Ambulatory Visit | Attending: Radiation Oncology | Admitting: Radiation Oncology

## 2017-12-19 DIAGNOSIS — Z510 Encounter for antineoplastic radiation therapy: Secondary | ICD-10-CM | POA: Diagnosis not present

## 2017-12-20 ENCOUNTER — Ambulatory Visit
Admission: RE | Admit: 2017-12-20 | Discharge: 2017-12-20 | Disposition: A | Discharge status: Home / Self Care | Payer: Medicare Other | Source: Ambulatory Visit | Attending: Radiation Oncology | Admitting: Radiation Oncology

## 2017-12-20 DIAGNOSIS — Z510 Encounter for antineoplastic radiation therapy: Secondary | ICD-10-CM | POA: Diagnosis not present

## 2017-12-21 ENCOUNTER — Ambulatory Visit: Payer: Medicare Other

## 2017-12-21 ENCOUNTER — Ambulatory Visit: Payer: Medicare Other | Admitting: Gynecology

## 2017-12-21 ENCOUNTER — Encounter: Payer: Self-pay | Admitting: Gynecology

## 2017-12-21 VITALS — BP 120/74 | Ht 64.0 in | Wt 157.0 lb

## 2017-12-21 DIAGNOSIS — M8580 Other specified disorders of bone density and structure, unspecified site: Secondary | ICD-10-CM

## 2017-12-21 DIAGNOSIS — N8111 Cystocele, midline: Secondary | ICD-10-CM | POA: Diagnosis not present

## 2017-12-21 DIAGNOSIS — Z8041 Family history of malignant neoplasm of ovary: Secondary | ICD-10-CM | POA: Diagnosis not present

## 2017-12-21 DIAGNOSIS — N952 Postmenopausal atrophic vaginitis: Secondary | ICD-10-CM | POA: Diagnosis not present

## 2017-12-21 DIAGNOSIS — Z01411 Encounter for gynecological examination (general) (routine) with abnormal findings: Secondary | ICD-10-CM | POA: Diagnosis not present

## 2017-12-21 DIAGNOSIS — Z853 Personal history of malignant neoplasm of breast: Secondary | ICD-10-CM

## 2017-12-21 NOTE — Patient Instructions (Signed)
Follow-up for the ultrasound and bone density as scheduled. 

## 2017-12-21 NOTE — Progress Notes (Signed)
Cynthia Thornton 1946-08-20 [id number]        72 y.o.  [id number] for annual gynecologic exam.  Recently diagnosed with left-sided breast cancer status post lumpectomy.  Strong family history of breast cancer and history of ovarian cancer.  Issues reviewed below.  Past medical history,surgical history, problem list, medications, allergies, family history and social history were all reviewed and documented as reviewed in the EPIC chart.  ROS:  Performed with pertinent positives and negatives included in the history, assessment and plan.   Additional significant findings : None   Exam: Cynthia Thornton assistant Vitals:   12/21/17 0846  BP: 120/74  Weight: 157 lb (71.2 kg)  Height: 5\' 4"  (1.626 m)   Body mass index is 26.95 kg/m.  General appearance:  Normal affect, orientation and appearance. Skin: Grossly normal HEENT: Without gross lesions.  No cervical or supraclavicular adenopathy. Thyroid normal.  Lungs:  Clear without wheezing, rales or rhonchi Cardiac: RR, without RMG Abdominal:  Soft, nontender, without masses, guarding, rebound, organomegaly or hernia Breasts:  Examined lying and sitting.  Right without masses, retractions, discharge or axillary adenopathy.  Left with healing lumpectomy scar and axillary scar.  No masses or adenopathy palpated Pelvic:  Ext, BUS, Vagina: With atrophic changes.  First-degree cystocele/possible enterocele  Adnexa: Without masses or tenderness    Anus and perineum: Normal   Rectovaginal: Normal sphincter tone without palpated masses or tenderness.    Assessment/Plan:  72 y.o. [id number] female for annual gynecologic exam.   1. Postmenopausal/atrophic genital changes.  Status post [name] for DU B.  No significant hot flushes, night sweats, vaginal dryness. 2. Recent diagnosis of breast cancer.  Strong family history include sister, cousin, maternal aunt.  Also ovarian cancer in maternal aunt.  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  Patient and I had an extensive discussion about the issues of genetic linkage and breast and ovarian cancer.  She is status post hysterectomy but still has her ovaries.  Although no clear known genetic link she does have a variant of unknown significance.  This may or may not increase her risk for ovarian cancer.  The issues of what to do in her particular case were discussed and the options to include expectant management with annual exams, surveillance with CA 125 and ultrasounds and prophylactic bilateral salpingo-oophorectomies all reviewed.  The pitfalls of screening with CA 125 and ultrasounds discussed to include false positive and false negative results and whether this would ultimately change survival.  Risks of surgery as well as benefits of definitive removal of her ovaries but no guarantees as far as peritoneal carcinoma reviewed.  At this point after an extensive discussion the patient is not interested in prophylactic surgery but does want to proceed with surveillance understanding the issues.  Will order CA 125 and ultrasound now.  We will plan on annual surveillance and annual exams.  She will can continue to follow-up with her oncologist for ongoing management of her breast cancer.  Exam today is NED. 3. Osteopenia.  DEXA 02/2016 T score -1.9 FRAX 11% / 1.7%.  Recommend DEXA in after May 2019 and she is going to go ahead and schedule that now for a follow-up at a 2-year interval. 4. Cystocele/possible enterocele.  Stable on serial exams.  Asymptomatic to the patient.  We will continue to monitor with annual exams.  Patient will represent if she feels any pressure or other discomforts. 5.  Colonoscopy 2016.  Their recommended interval. 6. Pap smear 2012.  No Pap smear done today.  We both agreed to stop screening per current screening guidelines based on age and hysterectomy history with no history of abnormal Pap smears previously. 7. Health  maintenance.  No routine lab work done as patient does this elsewhere.  Follow-up for ultrasound/CA 125 results.  Follow-up in 1 year for annual exam.  Additional time in excess of her routine gynecologic exam was spent in direct face to face counseling and coordination of care in regards to her family history of ovarian cancer, family history of breast cancer, personal history of breast cancer, variant of unknown significance on genetic screening.    [name] MD, 9:20 AM 12/21/2017

## 2017-12-24 ENCOUNTER — Ambulatory Visit
Admission: RE | Admit: 2017-12-24 | Discharge: 2017-12-24 | Disposition: A | Discharge status: Home / Self Care | Payer: Medicare Other | Source: Ambulatory Visit | Attending: Radiation Oncology | Admitting: Radiation Oncology

## 2017-12-24 DIAGNOSIS — Z510 Encounter for antineoplastic radiation therapy: Secondary | ICD-10-CM | POA: Diagnosis not present

## 2017-12-24 LAB — CA 125: CA 125: 6 U/mL (ref <35)

## 2017-12-25 ENCOUNTER — Ambulatory Visit: Payer: Medicare Other

## 2017-12-25 ENCOUNTER — Telehealth: Payer: Self-pay | Admitting: Internal Medicine

## 2017-12-25 ENCOUNTER — Telehealth: Payer: Self-pay | Admitting: *Deleted

## 2017-12-25 MED ORDER — CEPHALEXIN 500 MG PO TABS: 500.0000 mg | ORAL_TABLET | Freq: Three times a day (TID) | ORAL | 0 refills | Status: DC

## 2017-12-25 MED ORDER — OSELTAMIVIR PHOSPHATE 75 MG PO CAPS: 75.0000 mg | ORAL_CAPSULE | Freq: Two times a day (BID) | ORAL | 0 refills | Status: DC

## 2017-12-25 NOTE — Telephone Encounter (Signed)
tamiflu 75mg  bid x 5 days and  cephalexin 500mg  three times daily x  5 days  Empiric Rx  Monitor for gi side efects and rash Hydrate well Bed rest To ER if worse   Dr. Brand Males, M.D., [facility].C.P Pulmonary and Critical Care Medicine Staff Physician, [place] Director - Interstitial Lung Disease  Program  Pulmonary [place] [facility] [place], [place], [address]  Pager: [phone], If no answer or between  15:00h - 7:00h: call [phone] Telephone: [place]  . Codeine Nausea Only  . Propranolol Hcl Other (See Comments)    dizziness

## 2017-12-25 NOTE — Telephone Encounter (Signed)
Pt called to relate she has the flu and will not be in today for radiation. Msg sent to xrt team and [facility] called to notify pt will not be at appt today. Pt denies further needs at this time.

## 2017-12-25 NOTE — Telephone Encounter (Signed)
Left message for patient to call back  

## 2017-12-25 NOTE — Telephone Encounter (Signed)
Spoke with patient, she is aware of recs.   Will go ahead and call in medications for patient.   Nothing else needed at time of call.

## 2017-12-25 NOTE — Telephone Encounter (Signed)
Spoke with patient. She stated that she called earlier today, she just had some chest congestion and chills. Now, she has a fever of 101.23F, chills and a productive cough with yellow mucus.   Patient is concerned that she may have the flu. She already received a flu shot already this season.   Patient is currently undergoing radiation for breast cancer.   She wishes to use [facility] on [address].   MR, please advise. Thanks!

## 2017-12-26 ENCOUNTER — Ambulatory Visit: Payer: Medicare Other

## 2017-12-27 ENCOUNTER — Ambulatory Visit
Admission: RE | Admit: 2017-12-27 | Discharge: 2017-12-27 | Disposition: A | Discharge status: Home / Self Care | Payer: Medicare Other | Source: Ambulatory Visit | Attending: Radiation Oncology | Admitting: Radiation Oncology

## 2017-12-27 DIAGNOSIS — Z510 Encounter for antineoplastic radiation therapy: Secondary | ICD-10-CM | POA: Diagnosis not present

## 2017-12-28 ENCOUNTER — Ambulatory Visit
Admission: RE | Admit: 2017-12-28 | Discharge: 2017-12-28 | Disposition: A | Discharge status: Home / Self Care | Payer: Medicare Other | Source: Ambulatory Visit | Attending: Radiation Oncology | Admitting: Radiation Oncology

## 2017-12-28 ENCOUNTER — Ambulatory Visit: Payer: Medicare Other

## 2017-12-28 DIAGNOSIS — Z510 Encounter for antineoplastic radiation therapy: Secondary | ICD-10-CM | POA: Diagnosis not present

## 2017-12-28 DIAGNOSIS — C50212 Malignant neoplasm of upper-inner quadrant of left female breast: Secondary | ICD-10-CM | POA: Diagnosis present

## 2017-12-28 DIAGNOSIS — Z170 Estrogen receptor positive status [ER+]: Secondary | ICD-10-CM | POA: Diagnosis not present

## 2017-12-31 ENCOUNTER — Ambulatory Visit
Admission: RE | Admit: 2017-12-31 | Discharge: 2017-12-31 | Disposition: A | Discharge status: Home / Self Care | Payer: Medicare Other | Source: Ambulatory Visit | Attending: Radiation Oncology | Admitting: Radiation Oncology

## 2017-12-31 DIAGNOSIS — Z510 Encounter for antineoplastic radiation therapy: Secondary | ICD-10-CM | POA: Diagnosis not present

## 2018-01-01 ENCOUNTER — Ambulatory Visit
Admission: RE | Admit: 2018-01-01 | Discharge: 2018-01-01 | Disposition: A | Discharge status: Home / Self Care | Payer: Medicare Other | Source: Ambulatory Visit | Attending: Radiation Oncology | Admitting: Radiation Oncology

## 2018-01-01 ENCOUNTER — Ambulatory Visit: Payer: Medicare Other | Admitting: Radiation Oncology

## 2018-01-01 DIAGNOSIS — Z510 Encounter for antineoplastic radiation therapy: Secondary | ICD-10-CM | POA: Diagnosis not present

## 2018-01-02 ENCOUNTER — Ambulatory Visit
Admission: RE | Admit: 2018-01-02 | Discharge: 2018-01-02 | Disposition: A | Discharge status: Home / Self Care | Payer: Medicare Other | Source: Ambulatory Visit | Attending: Radiation Oncology | Admitting: Radiation Oncology

## 2018-01-02 DIAGNOSIS — Z510 Encounter for antineoplastic radiation therapy: Secondary | ICD-10-CM | POA: Diagnosis not present

## 2018-01-03 ENCOUNTER — Ambulatory Visit
Admission: RE | Admit: 2018-01-03 | Discharge: 2018-01-03 | Disposition: A | Discharge status: Home / Self Care | Payer: Medicare Other | Source: Ambulatory Visit | Attending: Radiation Oncology | Admitting: Radiation Oncology

## 2018-01-03 DIAGNOSIS — Z510 Encounter for antineoplastic radiation therapy: Secondary | ICD-10-CM | POA: Diagnosis not present

## 2018-01-04 ENCOUNTER — Ambulatory Visit: Payer: Medicare Other | Admitting: Radiation Oncology

## 2018-01-04 ENCOUNTER — Ambulatory Visit
Admission: RE | Admit: 2018-01-04 | Discharge: 2018-01-04 | Disposition: A | Discharge status: Home / Self Care | Payer: Medicare Other | Source: Ambulatory Visit | Attending: Radiation Oncology | Admitting: Radiation Oncology

## 2018-01-04 DIAGNOSIS — C50312 Malignant neoplasm of lower-inner quadrant of left female breast: Secondary | ICD-10-CM

## 2018-01-04 DIAGNOSIS — Z170 Estrogen receptor positive status [ER+]: Principal | ICD-10-CM

## 2018-01-04 DIAGNOSIS — Z510 Encounter for antineoplastic radiation therapy: Secondary | ICD-10-CM | POA: Diagnosis not present

## 2018-01-04 MED ORDER — RADIAPLEXRX EX GEL
Freq: Two times a day (BID) | CUTANEOUS | Status: DC
  Administered 2018-01-04: 10:00:00 via TOPICAL

## 2018-01-07 ENCOUNTER — Ambulatory Visit: Payer: Medicare Other

## 2018-01-07 ENCOUNTER — Ambulatory Visit
Admission: RE | Admit: 2018-01-07 | Discharge: 2018-01-07 | Disposition: A | Discharge status: Home / Self Care | Payer: Medicare Other | Source: Ambulatory Visit | Attending: Radiation Oncology | Admitting: Radiation Oncology

## 2018-01-07 DIAGNOSIS — Z510 Encounter for antineoplastic radiation therapy: Secondary | ICD-10-CM | POA: Diagnosis not present

## 2018-01-08 ENCOUNTER — Ambulatory Visit
Admission: RE | Admit: 2018-01-08 | Discharge: 2018-01-08 | Disposition: A | Discharge status: Home / Self Care | Payer: Medicare Other | Source: Ambulatory Visit | Attending: Radiation Oncology | Admitting: Radiation Oncology

## 2018-01-08 ENCOUNTER — Ambulatory Visit: Payer: Medicare Other

## 2018-01-08 DIAGNOSIS — Z510 Encounter for antineoplastic radiation therapy: Secondary | ICD-10-CM | POA: Diagnosis not present

## 2018-01-09 ENCOUNTER — Ambulatory Visit: Payer: Medicare Other

## 2018-01-09 ENCOUNTER — Ambulatory Visit
Admission: RE | Admit: 2018-01-09 | Discharge: 2018-01-09 | Disposition: A | Discharge status: Home / Self Care | Payer: Medicare Other | Source: Ambulatory Visit | Attending: Radiation Oncology | Admitting: Radiation Oncology

## 2018-01-09 ENCOUNTER — Inpatient Hospital Stay: Payer: Medicare Other | Attending: Hematology and Oncology | Admitting: Hematology and Oncology

## 2018-01-09 ENCOUNTER — Telehealth: Payer: Self-pay | Admitting: Hematology and Oncology

## 2018-01-09 DIAGNOSIS — Z170 Estrogen receptor positive status [ER+]: Secondary | ICD-10-CM | POA: Diagnosis not present

## 2018-01-09 DIAGNOSIS — Z79899 Other long term (current) drug therapy: Secondary | ICD-10-CM | POA: Diagnosis not present

## 2018-01-09 DIAGNOSIS — Z79811 Long term (current) use of aromatase inhibitors: Secondary | ICD-10-CM

## 2018-01-09 DIAGNOSIS — C50312 Malignant neoplasm of lower-inner quadrant of left female breast: Secondary | ICD-10-CM | POA: Insufficient documentation

## 2018-01-09 DIAGNOSIS — Z510 Encounter for antineoplastic radiation therapy: Secondary | ICD-10-CM | POA: Diagnosis not present

## 2018-01-09 DIAGNOSIS — Z7982 Long term (current) use of aspirin: Secondary | ICD-10-CM | POA: Insufficient documentation

## 2018-01-09 MED ORDER — LETROZOLE 2.5 MG PO TABS: 2.5000 mg | ORAL_TABLET | Freq: Every day | ORAL | 3 refills | Status: DC

## 2018-01-09 NOTE — Assessment & Plan Note (Addendum)
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 Oncotype DX 16: 4% risk of recurrence with 5 years of tamoxifen: Low risk 2. Adjuvant radiation therapy 12/19/2017 to 01/11/2018 3. Adjuvant antiestrogen therapy: With letrozole 2.5 mg daily to start 01/28/2018   Letrozole counseling:We discussed the risks and benefits of anti-estrogen therapy with aromatase inhibitors. These include but not limited to insomnia, hot flashes, mood changes, vaginal dryness, bone density loss, and weight gain. We strongly believe that the benefits far outweigh the risks. Patient understands these risks and consented to starting treatment. Planned treatment duration is 5-7 years.  Return to clinic in 3 months for survivorship care plan visit

## 2018-01-09 NOTE — Telephone Encounter (Signed)
Gave patient AVs and calendar of upcoming march through July appointments.

## 2018-01-09 NOTE — Progress Notes (Signed)
Patient Care Team: Colon Branch, MD as PCP - Gaston Islam, MD as Consulting Physician (Orthopedic Surgery) Nicholas Lose, MD as Consulting Physician (Hematology and Oncology) [name], MD as Consulting Physician (General Surgery) Fontaine, Belinda Block, MD as Consulting Physician (Gynecology) Brand Males, MD as Consulting Physician (Pulmonary Disease) Kyung Rudd, MD as Consulting Physician (Radiation Oncology)  DIAGNOSIS:  Encounter Diagnosis  Name Primary?  . Malignant neoplasm of lower-inner quadrant of left breast in female, estrogen receptor positive ([place])     SUMMARY OF ONCOLOGIC HISTORY:   Malignant neoplasm of lower-inner quadrant of left breast in female, estrogen receptor positive ([place])   09/26/2017 Initial Diagnosis    Screening detected left breast calcifications 1.1 cm span axilla negative, biopsy revealed grade 2 invasive ductal carcinoma with DCIS ER 100% PR 2%, HER-2 negative ratio 0.78, Ki-67 20%, T1CN0 stage Ia clinical stage AJCC 8      11/02/2017 Surgery    Left Lumpectomy: IDC grade 2, 1.2 cm, 0/3 LN Neg, , ER 100% PR 2%, HER-2 negative ratio 0.78, Ki-67 20% T1c N0 Stage 1A      12/19/2017 - 01/09/2018 Radiation Therapy    Adjuvant radiation therapy       CHIEF COMPLIANT: Follow-up after near finishing radiation therapy  INTERVAL HISTORY: Cynthia Thornton is a 72 year old with above-mentioned history left breast cancer treated with lumpectomy and is currently undergoing radiation therapy.  She is tolerating it fairly well.  She does not have any significant radiation dermatitis.  She does not have any fatigue.  She is really ready to hear about antiestrogen treatments.  REVIEW OF SYSTEMS:   Constitutional: Denies fevers, chills or abnormal weight loss Eyes: Denies blurriness of vision Ears, nose, mouth, throat, and face: Denies mucositis or sore throat Respiratory: Denies cough, dyspnea or wheezes Cardiovascular: Denies palpitation, chest  discomfort Gastrointestinal:  Denies nausea, heartburn or change in bowel habits Skin: Denies abnormal skin rashes Lymphatics: Denies new lymphadenopathy or easy bruising Neurological:Denies numbness, tingling or new weaknesses Behavioral/Psych: Mood is stable, no new changes  Extremities: No lower extremity edema Breast:  denies any pain or lumps or nodules in either breasts All other systems were reviewed with the patient and are negative.  I have reviewed the past medical history, past surgical history, social history and family history with the patient and they are unchanged from previous note.  ALLERGIES:  is allergic to codeine and propranolol hcl.  MEDICATIONS:  Current Outpatient Medications  Medication Sig Dispense Refill  . aspirin EC 81 MG tablet Take 81 mg by mouth daily.    . Biotin 10000 MCG TABS Take by mouth daily.    . Cholecalciferol (VITAMIN D) 2000 units CAPS Take 2,000 Units by mouth daily.    . clorazepate (TRANXENE) 7.5 MG tablet Take 7.5 mg by mouth at bedtime as needed for anxiety.    . cyanocobalamin 1000 MCG tablet Take 1,000 mcg by mouth daily.    . famotidine (PEPCID) 20 MG tablet Take 1 tablet by mouth every evening as needed MUST HAVE OFFICE VISIT FOR FURTHER REFILLS 90 tablet 0  . [START ON 01/28/2018] letrozole (FEMARA) 2.5 MG tablet Take 1 tablet (2.5 mg total) by mouth daily. 90 tablet 3  . omeprazole (PRILOSEC) 40 MG capsule Take 1 capsule (40 mg total) by mouth every morning. MUST HAVE OFFICE VISIT FOR FURTHER REFILLS 90 capsule 0   No current facility-administered medications for this visit.     PHYSICAL EXAMINATION: ECOG PERFORMANCE STATUS: 1 -  Symptomatic but completely ambulatory  Vitals:   01/09/18 1514  BP: 127/69  Pulse: 69  Resp: 18  Temp: 98 F (36.7 C)  SpO2: 100%   Filed Weights   01/09/18 1514  Weight: 154 lb 14.4 oz (70.3 kg)    GENERAL:alert, no distress and comfortable SKIN: skin color, texture, turgor are normal, no  rashes or significant lesions EYES: normal, Conjunctiva are pink and non-injected, sclera clear OROPHARYNX:no exudate, no erythema and lips, buccal mucosa, and tongue normal  NECK: supple, thyroid normal size, non-tender, without nodularity LYMPH:  no palpable lymphadenopathy in the cervical, axillary or inguinal LUNGS: clear to auscultation and percussion with normal breathing effort HEART: regular rate & rhythm and no murmurs and no lower extremity edema ABDOMEN:abdomen soft, non-tender and normal bowel sounds MUSCULOSKELETAL:no cyanosis of digits and no clubbing  NEURO: alert & oriented x 3 with fluent speech, no focal motor/sensory deficits EXTREMITIES: No lower extremity edema  LABORATORY DATA:  I have reviewed the data as listed CMP Latest Ref Rng & Units 10/03/2017 12/08/2016 08/09/2016  Glucose 70 - 140 mg/dl 90 87 91  BUN 7.0 - 26.0 mg/dL 24.3 14 12   Creatinine 0.6 - 1.1 mg/dL 1.0 0.88 0.91  Sodium 136 - 145 mEq/L 134(L) 135 134(L)  Potassium 3.5 - 5.1 mEq/L 4.3 4.6 4.3  Chloride 98 - 110 mmol/L - 100 98  CO2 22 - 29 mEq/L 23 24 28   Calcium 8.4 - 10.4 mg/dL 9.7 9.6 9.5  Total Protein 6.4 - 8.3 g/dL 7.7 - 7.1  Total Bilirubin 0.20 - 1.20 mg/dL 0.44 - 0.5  Alkaline Phos 40 - 150 U/L 107 - 94  AST 5 - 34 U/L 21 - 16  ALT 0 - 55 U/L 17 - 13    Lab Results  Component Value Date   WBC 6.2 10/03/2017   HGB 13.0 10/03/2017   HCT 37.6 10/03/2017   MCV 88.3 10/03/2017   PLT 305 10/03/2017   NEUTROABS 3.9 10/03/2017    ASSESSMENT & PLAN:  Malignant neoplasm of lower-inner quadrant of left breast in female, estrogen receptor positive ([place]) 10/31/17: Left Lumpectomy: IDC grade 2, 1.2 cm, 0/3 LN Neg, , ER 100% PR 2%, HER-2 negative ratio 0.78, Ki-67 20% T1c N0 Stage 1A  Treatment summary: 1. Oncotype DX 16: 4% risk of recurrence with 5 years of tamoxifen: Low risk 2. Adjuvant radiation therapy 12/19/2017 to 01/11/2018 3. Adjuvant antiestrogen therapy: With letrozole 2.5 mg daily to  start 01/28/2018   Letrozole counseling:We discussed the risks and benefits of anti-estrogen therapy with aromatase inhibitors. These include but not limited to insomnia, hot flashes, mood changes, vaginal dryness, bone density loss, and weight gain. We strongly believe that the benefits far outweigh the risks. Patient understands these risks and consented to starting treatment. Planned treatment duration is 5-7 years.  Return to clinic in 3 months for survivorship care plan visit    I spent 25 minutes talking to the patient of which more than half was spent in counseling and coordination of care.  No orders of the defined types were placed in this encounter.  The patient has a good understanding of the overall plan. she agrees with it. she will call with any problems that may develop before the next visit here.   Harriette Ohara, MD 01/09/18

## 2018-01-10 ENCOUNTER — Ambulatory Visit
Admission: RE | Admit: 2018-01-10 | Discharge: 2018-01-10 | Disposition: A | Discharge status: Home / Self Care | Payer: Medicare Other | Source: Ambulatory Visit | Attending: Radiation Oncology | Admitting: Radiation Oncology

## 2018-01-10 ENCOUNTER — Ambulatory Visit: Payer: Medicare Other

## 2018-01-10 DIAGNOSIS — Z510 Encounter for antineoplastic radiation therapy: Secondary | ICD-10-CM | POA: Diagnosis not present

## 2018-01-11 ENCOUNTER — Encounter: Payer: Self-pay | Admitting: Radiation Oncology

## 2018-01-11 ENCOUNTER — Ambulatory Visit: Payer: Medicare Other

## 2018-01-11 ENCOUNTER — Ambulatory Visit
Admission: RE | Admit: 2018-01-11 | Discharge: 2018-01-11 | Disposition: A | Discharge status: Home / Self Care | Payer: Medicare Other | Source: Ambulatory Visit | Attending: Radiation Oncology | Admitting: Radiation Oncology

## 2018-01-11 DIAGNOSIS — Z510 Encounter for antineoplastic radiation therapy: Secondary | ICD-10-CM | POA: Diagnosis not present

## 2018-01-14 ENCOUNTER — Ambulatory Visit: Payer: Medicare Other

## 2018-01-15 ENCOUNTER — Encounter: Payer: Self-pay | Admitting: *Deleted

## 2018-01-21 ENCOUNTER — Ambulatory Visit: Payer: Medicare Other | Admitting: Gynecology

## 2018-01-21 ENCOUNTER — Encounter: Payer: Self-pay | Admitting: Gynecology

## 2018-01-21 ENCOUNTER — Ambulatory Visit (INDEPENDENT_AMBULATORY_CARE_PROVIDER_SITE_OTHER): Payer: Medicare Other

## 2018-01-21 VITALS — BP 118/76

## 2018-01-21 DIAGNOSIS — Z853 Personal history of malignant neoplasm of breast: Secondary | ICD-10-CM

## 2018-01-21 DIAGNOSIS — Z8041 Family history of malignant neoplasm of ovary: Secondary | ICD-10-CM | POA: Diagnosis not present

## 2018-01-21 DIAGNOSIS — Z803 Family history of malignant neoplasm of breast: Secondary | ICD-10-CM

## 2018-01-21 NOTE — Patient Instructions (Signed)
Follow-up when due for your annual exam next year.

## 2018-01-21 NOTE — Progress Notes (Signed)
    Cynthia Thornton 12/25/1945 [id number]        72 y.o.  [id number] presents for ultrasound for ovarian surveillance.  Recent diagnosis of breast cancer.  She also has a strong family history of breast cancer and a maternal aunt with ovarian cancer.  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  She is status post hysterectomy but still has her ovaries.  We had a lengthy discussion as to the options for her management in her note 12/21/2017 at this point she elects for ovarian surveillance with ultrasound.  Recent CA 125 was 6.  Past medical history,surgical history, problem list, medications, allergies, family history and social history were all reviewed and documented in the EPIC chart.  Directed ROS with pertinent positives and negatives documented in the history of present illness/assessment and plan.  Exam: Vitals:   01/21/18 1556  BP: 118/76   General appearance:  Normal  Transvaginal and transabdominal ultrasound status post hysterectomy shows vaginal cuff appearing normal.  Right and left ovaries were not visualized.  No apparent mass in the right or left adnexa.  Cul-de-sac negative.  Assessment/Plan:  72 y.o. [id number] with history of breast cancer and strong family history of breast cancer and ovarian cancer.  Genetic panel negative excepting for variant of unknown significance.  Patient elected for CA 125 and ultrasound screening.  Her CA 125 and ultrasound are both negative.  Patient was reassured and will follow-up next year at her annual exam when due.    Cynthia Auerbach MD, 4:04 PM 01/21/2018

## 2018-01-25 ENCOUNTER — Ambulatory Visit: Payer: Medicare Other | Admitting: Internal Medicine

## 2018-01-25 ENCOUNTER — Encounter: Payer: Self-pay | Admitting: Internal Medicine

## 2018-01-25 VITALS — BP 110/74 | HR 69 | Ht 64.0 in | Wt 155.0 lb

## 2018-01-25 DIAGNOSIS — K219 Gastro-esophageal reflux disease without esophagitis: Secondary | ICD-10-CM

## 2018-01-25 DIAGNOSIS — Z86010 Personal history of colonic polyps: Secondary | ICD-10-CM | POA: Diagnosis not present

## 2018-01-25 DIAGNOSIS — Z79899 Other long term (current) drug therapy: Secondary | ICD-10-CM | POA: Diagnosis not present

## 2018-01-25 MED ORDER — FAMOTIDINE 20 MG PO TABS: ORAL_TABLET | ORAL | 3 refills | Status: DC

## 2018-01-25 MED ORDER — OMEPRAZOLE 40 MG PO CPDR: 40.0000 mg | DELAYED_RELEASE_CAPSULE | ORAL | 3 refills | Status: DC

## 2018-01-25 NOTE — Patient Instructions (Signed)
Normal BMI (Body Mass Index- based on height and weight) is between 23 and 30. Your BMI today is Body mass index is 26.61 kg/m. Marland Kitchen Please consider follow up  regarding your BMI with your Primary Care Provider.   We have sent medications to your pharmacy for you to pick up at your convenience:

## 2018-01-25 NOTE — Progress Notes (Signed)
Subjective:    Patient ID: Cynthia Thornton, female    DOB: Aug 10, 1946, 72 y.o.   MRN: [id number]  HPI Cynthia Thornton is a 72 year old female with a history of GERD, family history of colon cancer, personal history of sessile serrated polyp and history of breast cancer who is here for follow-up. She is here alone today and last seen in Oct 2017.  She reports that she has been doing well but has needed to continue omeprazole 40 mg each morning with famotidine 20 mg in the evening.  If she misses doses she will have breakthrough heartburn and indigestion.  No dysphagia or odynophagia.  Bowel movements have been regular.  No abdominal pain.  Her last colonoscopy was in January 2016 where 1 sessile serrated polyp was removed.  She will be due surveillance next year.  She will soon be starting letrozole.  Review of Systems As per HPI, otherwise negative  Current Medications, Allergies, Past Medical History, Past Surgical History, Family History and Social History were reviewed in [employer] record.     Objective:   Physical Exam BP 110/74   Pulse 69   Ht 5\' 4"  (1.626 m)   Wt 155 lb (70.3 kg)   BMI 26.61 kg/m  Constitutional: Well-developed and well-nourished. No distress. HEENT: Normocephalic and atraumatic  Conjunctivae are normal.  No scleral icterus. Neck: Neck supple. Trachea midline. Cardiovascular: Normal rate, regular rhythm and intact distal pulses. No M/R/G Pulmonary/chest: Effort normal and breath sounds normal. No wheezing, rales or rhonchi. Abdominal: Soft, nontender, nondistended. Bowel sounds active throughout. There are no masses palpable. No hepatosplenomegaly. Extremities: no clubbing, cyanosis, or edema Neurological: Alert and oriented to person place and time. Skin: Skin is warm and dry. Psychiatric: Normal mood and affect. Behavior is normal.  CBC    Component Value Date/Time   WBC 6.2 10/03/2017 0837   WBC 7.5 12/08/2016 1537   RBC 4.26  10/03/2017 0837   RBC 3.92 12/08/2016 1537   HGB 13.0 10/03/2017 0837   HCT 37.6 10/03/2017 0837   PLT 305 10/03/2017 0837   MCV 88.3 10/03/2017 0837   MCH 30.5 10/03/2017 0837   MCH 27.8 12/08/2016 1537   MCHC 34.6 10/03/2017 0837   MCHC 32.5 12/08/2016 1537   RDW 13.4 10/03/2017 0837   LYMPHSABS 1.5 10/03/2017 0837   MONOABS 0.5 10/03/2017 0837   EOSABS 0.2 10/03/2017 0837   BASOSABS 0.1 10/03/2017 0837   CMP     Component Value Date/Time   NA 134 (L) 10/03/2017 0837   K 4.3 10/03/2017 0837   CL 100 12/08/2016 1537   CO2 23 10/03/2017 0837   GLUCOSE 90 10/03/2017 0837   BUN 24.3 10/03/2017 0837   CREATININE 1.0 10/03/2017 0837   CALCIUM 9.7 10/03/2017 0837   PROT 7.7 10/03/2017 0837   ALBUMIN 4.7 10/03/2017 0837   AST 21 10/03/2017 0837   ALT 17 10/03/2017 0837   ALKPHOS 107 10/03/2017 0837   BILITOT 0.44 10/03/2017 0837   GFRNONAA 46 (L) 03/04/2016 0251   GFRAA 54 (L) 03/04/2016 0251       Assessment & Plan:  72 year old female with a history of GERD, family history of colon cancer, personal history of sessile serrated polyp and history of breast cancer who is here for follow-up.  1.  GERD --she continues to have well-controlled symptoms on omeprazole 40 mg in the morning and famotidine 20 mg in the evening.  Attempt to discontinue or disrupt therapy has resulted in recurrent  symptoms.  We did discuss long-term risk versus benefit to PPI including possibility of interference with calcium absorption and worsening of bone disease.  She has a DEXA scan coming up.  She will let me know if her bone mineral density is declining.  If so we could consider twice Thornton H2 blocker rather than PPI.  For now we will continue omeprazole 40 mg in the morning, famotidine 20 mg in the evening  2.  History of sessile serrated polyp --surveillance colonoscopy recommended in January 2021.    Follow-up in 18 months, sooner if needed 15 minutes spent with the patient today. Greater than  50% was spent in counseling and coordination of care with the patient

## 2018-01-31 ENCOUNTER — Telehealth: Payer: Self-pay | Admitting: *Deleted

## 2018-01-31 NOTE — Telephone Encounter (Signed)
CALLED PATIENT TO INFORM OF FU FOR 02-14-18 @ 10:30 AM WITH ALISON PERKINS, SPOKE WITH PATIENT AND SHE IS AWARE OF THIS APPT.

## 2018-02-11 NOTE — Progress Notes (Signed)
  Radiation Oncology         [phone] ________________________________  Name: Cynthia Thornton MRN: [id number]  Date: 01/11/2018  DOB: [birth year]  End of Treatment Note  Diagnosis:   left-sided breast cancer     Indication for treatment:  Curative       Radiation treatment dates:   12/13/17 - 01/11/18  Site/dose:   The patient initially received a dose of 42.5 Gy in 17 fractions to the breast using whole-breast tangent fields. This was delivered using a 3-D conformal technique. The patient then received a boost to the seroma. This delivered an additional 7.5 Gy in 3 fractions using a 3 field photon technique due to the depth of the seroma. The total dose was 50 Gy.  Narrative: The patient tolerated radiation treatment relatively well.   The patient had some expected skin irritation as she progressed during treatment. Moist desquamation was not present at the end of treatment.  Plan: The patient has completed radiation treatment. The patient will return to radiation oncology clinic for routine followup in one month. I advised the patient to call or return sooner if they have any questions or concerns related to their recovery or treatment. ________________________________  Jodelle Gross, M.D., Ph.D.

## 2018-02-14 ENCOUNTER — Encounter: Payer: Self-pay | Admitting: Radiation Oncology

## 2018-02-14 ENCOUNTER — Ambulatory Visit
Admission: RE | Admit: 2018-02-14 | Discharge: 2018-02-14 | Disposition: A | Discharge status: Home / Self Care | Payer: Medicare Other | Source: Ambulatory Visit | Attending: Radiation Oncology | Admitting: Radiation Oncology

## 2018-02-14 ENCOUNTER — Other Ambulatory Visit: Payer: Self-pay

## 2018-02-14 VITALS — BP 114/66 | HR 65 | Temp 98.0°F | Resp 16 | Ht 64.0 in | Wt 153.8 lb

## 2018-02-14 DIAGNOSIS — Z923 Personal history of irradiation: Secondary | ICD-10-CM | POA: Insufficient documentation

## 2018-02-14 DIAGNOSIS — C50912 Malignant neoplasm of unspecified site of left female breast: Secondary | ICD-10-CM | POA: Diagnosis not present

## 2018-02-14 DIAGNOSIS — C50312 Malignant neoplasm of lower-inner quadrant of left female breast: Secondary | ICD-10-CM

## 2018-02-14 DIAGNOSIS — Z170 Estrogen receptor positive status [ER+]: Secondary | ICD-10-CM | POA: Insufficient documentation

## 2018-02-14 DIAGNOSIS — Z79811 Long term (current) use of aromatase inhibitors: Secondary | ICD-10-CM | POA: Diagnosis not present

## 2018-02-14 DIAGNOSIS — N644 Mastodynia: Secondary | ICD-10-CM | POA: Insufficient documentation

## 2018-02-14 DIAGNOSIS — Z79899 Other long term (current) drug therapy: Secondary | ICD-10-CM | POA: Insufficient documentation

## 2018-02-14 NOTE — Progress Notes (Signed)
Radiation Oncology         [phone]) [phone] ________________________________  Name: Cynthia Thornton MRN: [id number]  Date of Service: 02/14/2018  DOB: 23-Jan-1946  Post Treatment Note  CC: Colon Branch, MD  Nicholas Lose, MD  Diagnosis:  Stage IA, pT1cN0M0, grade 2 Er/PR positive invasive ductal carcinoma of the left breast.  Interval Since Last Radiation:  5 weeks   12/13/17 - 01/11/18: The patient initially received a dose of 42.5 Gy in 17 fractions to the breast using whole-breast tangent fields. This was delivered using a 3-D conformal technique. The patient then received a boost to the seroma. This delivered an additional 7.5 Gy in 3 fractions using a 3 field photon technique due to the depth of the seroma. The total dose was 50 Gy.   Narrative:  The patient returns today for routine follow-up. During treatment she did very well with radiotherapy and did not have significant desquamation.                             On review of systems, the patient states she's doing well. She started letrozole, and tolerating this well. She was upset by hearing on the news this morning that there's an increased risk of breast cancer with certain food groups. She also reports her left nipple is engorged and uncomfortable compared to the right. this has been ongoing for several weeks and is somewhat painful with clothes rubbing up against her breast. No other skin issues are verbalized. No nipple bleeding or discharge is noted.  ALLERGIES:  is allergic to codeine and propranolol hcl.  Meds: Current Outpatient Medications  Medication Sig Dispense Refill  . aspirin EC 81 MG tablet Take 81 mg by mouth daily.    . Cholecalciferol (VITAMIN D) 2000 units CAPS Take 2,000 Units by mouth daily.    . clorazepate (TRANXENE) 7.5 MG tablet Take 7.5 mg by mouth at bedtime as needed for anxiety.    . cyanocobalamin 1000 MCG tablet Take 1,000 mcg by mouth daily.    . famotidine (PEPCID) 20 MG tablet Take 1 tablet by mouth  at bedtime MUST HAVE OFFICE VISIT FOR FURTHER REFILLS 90 tablet 3  . letrozole (FEMARA) 2.5 MG tablet Take 1 tablet (2.5 mg total) by mouth daily. 90 tablet 3  . omeprazole (PRILOSEC) 40 MG capsule Take 1 capsule (40 mg total) by mouth every morning. MUST HAVE OFFICE VISIT FOR FURTHER REFILLS 90 capsule 3   No current facility-administered medications for this encounter.     Physical Findings:  height is 5\' 4"  (1.626 m) and weight is 153 lb 12.8 oz (69.8 kg). Her oral temperature is 98 F (36.7 C). Her blood pressure is 114/66 and her pulse is 65. Her respiration is 16 and oxygen saturation is 96%.  Pain Assessment Pain Score: 0-No pain/10 In general this is a well appearing caucasian female in no acute distress. She's alert and oriented x4 and appropriate throughout the examination. Cardiopulmonary assessment is negative for acute distress and she exhibits normal effort. The left breast was examined and reveals fluctuance consistent with seroma above the nipple in the upper outer quadrant of the left breast. The nipple is engorged and projects more than the right side, and is about twice as large as the right nipple. No erythema is noted or abnormalities of the incision line.   Lab Findings: Lab Results  Component Value Date   WBC 6.2 10/03/2017  HGB 13.0 10/03/2017   HCT 37.6 10/03/2017   MCV 88.3 10/03/2017   PLT 305 10/03/2017     Radiographic Findings: US Pelvis Transvanginal Non-ob (tv Only)  Result Date: 01/23/2018 Transvaginal and transabdominal ultrasound status post hysterectomy shows vaginal cuff appearing normal.  Right and left ovaries were not visualized.  No apparent mass in the right or left adnexa.  Cul-de-sac negative.    Impression/Plan: 1. Stage IA, pT1cN0M0, grade 2 Er/PR positive invasive ductal carcinoma of the left breast. The patient has been doing well since completion of radiotherapy. We discussed that we would be happy to continue to follow her as needed,  but she will also continue Letrozole to follow up with Dr. [name] in medical oncology. She was counseled on skin care as well as measures to avoid sun exposure to this area.  2. Survivorship. She will follow up with survivorship clinic in July 2019 and was given information on the resources available here at the cancer center. 3. Left nipple pain and concern for seroma. I spoke with Dr. Darrel Hoover triage nursing staff and they will contact the patient about trying to work her in for evaluation today.     Carola Rhine, PAC

## 2018-02-14 NOTE — Addendum Note (Signed)
Encounter addended by: Malena Edman, RN on: 02/14/2018 12:15 PM  Actions taken: Charge Capture section accepted

## 2018-03-18 ENCOUNTER — Ambulatory Visit (INDEPENDENT_AMBULATORY_CARE_PROVIDER_SITE_OTHER): Payer: Medicare Other

## 2018-03-18 DIAGNOSIS — M8589 Other specified disorders of bone density and structure, multiple sites: Secondary | ICD-10-CM | POA: Diagnosis not present

## 2018-03-18 DIAGNOSIS — M8580 Other specified disorders of bone density and structure, unspecified site: Secondary | ICD-10-CM

## 2018-03-18 DIAGNOSIS — Z780 Asymptomatic menopausal state: Secondary | ICD-10-CM

## 2018-03-18 DIAGNOSIS — M810 Age-related osteoporosis without current pathological fracture: Secondary | ICD-10-CM

## 2018-03-19 ENCOUNTER — Other Ambulatory Visit: Payer: Self-pay | Admitting: Gynecology

## 2018-03-19 DIAGNOSIS — M810 Age-related osteoporosis without current pathological fracture: Secondary | ICD-10-CM

## 2018-03-26 ENCOUNTER — Telehealth: Payer: Self-pay | Admitting: Gynecology

## 2018-03-26 ENCOUNTER — Encounter: Payer: Self-pay | Admitting: Gynecology

## 2018-03-26 NOTE — Telephone Encounter (Signed)
Tell patient her most recent bone density when looking at the spine, right and left hips were all stable from her prior study.  When we look at her wrist, which is the first time we looked at it, it is approaching osteoporosis but not quite there.  The wrist study is the least accurate out of all the measurements and my feeling is since the spine and the hips are stable that at this point I would do nothing further but relook it all of these areas in 2 years.  Weightbearing exercise such as walking on a regular basis as well as adequate calcium and vitamin D intake are all good for bone health.

## 2018-03-27 NOTE — Telephone Encounter (Signed)
patient informed

## 2018-04-01 ENCOUNTER — Other Ambulatory Visit: Payer: Self-pay | Admitting: Gynecology

## 2018-04-01 DIAGNOSIS — Z780 Asymptomatic menopausal state: Secondary | ICD-10-CM

## 2018-04-01 DIAGNOSIS — M8589 Other specified disorders of bone density and structure, multiple sites: Secondary | ICD-10-CM

## 2018-04-17 ENCOUNTER — Telehealth: Payer: Self-pay

## 2018-04-17 NOTE — Telephone Encounter (Signed)
LVM for patient reminding of SCP visit with NP on 04/30/18 at 2 pm.  Call back number included for questions/concerns.

## 2018-04-30 ENCOUNTER — Inpatient Hospital Stay: Payer: Medicare Other | Attending: Adult Health | Admitting: Adult Health

## 2018-04-30 ENCOUNTER — Telehealth: Payer: Self-pay | Admitting: Hematology and Oncology

## 2018-04-30 VITALS — BP 104/60 | HR 83 | Temp 98.4°F | Resp 18 | Ht 64.0 in | Wt 151.4 lb

## 2018-04-30 DIAGNOSIS — Z8041 Family history of malignant neoplasm of ovary: Secondary | ICD-10-CM | POA: Insufficient documentation

## 2018-04-30 DIAGNOSIS — Z801 Family history of malignant neoplasm of trachea, bronchus and lung: Secondary | ICD-10-CM | POA: Diagnosis not present

## 2018-04-30 DIAGNOSIS — Z7982 Long term (current) use of aspirin: Secondary | ICD-10-CM | POA: Insufficient documentation

## 2018-04-30 DIAGNOSIS — Z9221 Personal history of antineoplastic chemotherapy: Secondary | ICD-10-CM

## 2018-04-30 DIAGNOSIS — Z79811 Long term (current) use of aromatase inhibitors: Secondary | ICD-10-CM | POA: Diagnosis not present

## 2018-04-30 DIAGNOSIS — Z8249 Family history of ischemic heart disease and other diseases of the circulatory system: Secondary | ICD-10-CM | POA: Insufficient documentation

## 2018-04-30 DIAGNOSIS — Z923 Personal history of irradiation: Secondary | ICD-10-CM | POA: Diagnosis not present

## 2018-04-30 DIAGNOSIS — C50312 Malignant neoplasm of lower-inner quadrant of left female breast: Secondary | ICD-10-CM | POA: Diagnosis not present

## 2018-04-30 DIAGNOSIS — N951 Menopausal and female climacteric states: Secondary | ICD-10-CM | POA: Diagnosis not present

## 2018-04-30 DIAGNOSIS — Z170 Estrogen receptor positive status [ER+]: Secondary | ICD-10-CM | POA: Insufficient documentation

## 2018-04-30 DIAGNOSIS — Z803 Family history of malignant neoplasm of breast: Secondary | ICD-10-CM | POA: Diagnosis not present

## 2018-04-30 DIAGNOSIS — Z87891 Personal history of nicotine dependence: Secondary | ICD-10-CM | POA: Diagnosis not present

## 2018-04-30 DIAGNOSIS — Z79899 Other long term (current) drug therapy: Secondary | ICD-10-CM | POA: Insufficient documentation

## 2018-04-30 DIAGNOSIS — Z800 Family history of malignant neoplasm of digestive organs: Secondary | ICD-10-CM

## 2018-04-30 NOTE — Progress Notes (Signed)
CLINIC:  Survivorship   REASON FOR VISIT:  Routine follow-up post-treatment for a recent history of breast cancer.  BRIEF ONCOLOGIC HISTORY:    Malignant neoplasm of lower-inner quadrant of left breast in female, estrogen receptor positive ([place])   09/26/2017 Initial Diagnosis    Screening detected left breast calcifications 1.1 cm span axilla negative, biopsy revealed grade 2 invasive ductal carcinoma with DCIS ER 100% PR 2%, HER-2 negative ratio 0.78, Ki-67 20%, T1CN0 stage Ia clinical stage AJCC 8      11/02/2017 Surgery    Left Lumpectomy: IDC grade 2, 1.2 cm, 0/3 LN Neg, , ER 100% PR 2%, HER-2 negative ratio 0.78, Ki-67 20% T1c N0 Stage 1A      12/19/2017 - 01/09/2018 Radiation Therapy    Adjuvant radiation therapy      01/2018 -  Anti-estrogen oral therapy    Letrozole daily       INTERVAL HISTORY:  Cynthia Thornton presents to the [facility] today for our initial meeting to review her survivorship care plan detailing her treatment course for breast cancer, as well as monitoring long-term side effects of that treatment, education regarding health maintenance, screening, and overall wellness and health promotion.     Overall, Cynthia Thornton is doing moderately well.  She is tearful today because she is having difficulty with tolerating the Letrozole.  She has noted intermittent hot flashes a few times throughout the day, tearfulness, and hair thinning.  She is also concerned about a recent bone density she had done that indicated osteoporosis.      REVIEW OF SYSTEMS:  Review of Systems  Constitutional: Negative for appetite change, chills, fatigue and fever.  HENT:   Negative for hearing loss, lump/mass and trouble swallowing.   Eyes: Negative for eye problems and icterus.  Respiratory: Negative for chest tightness, cough and shortness of breath.   Cardiovascular: Negative for chest pain, leg swelling and palpitations.  Gastrointestinal: Negative for abdominal distention,  abdominal pain, constipation, diarrhea, nausea and vomiting.  Endocrine: Positive for hot flashes.  Skin: Negative for itching and rash.  Neurological: Negative for dizziness, extremity weakness, headaches and numbness.  Psychiatric/Behavioral: Negative for depression. The patient is not nervous/anxious.   Breast: Denies any new nodularity, masses, tenderness, nipple changes, or nipple discharge.      ONCOLOGY TREATMENT TEAM:  1. Surgeon:  Dr. Dalbert Batman at [facility] 2. Medical Oncologist: Dr. [name]  3. Radiation Oncologist: Dr. Lisbeth Renshaw    PAST MEDICAL/SURGICAL HISTORY:  Past Medical History:  Diagnosis Date  . Arthritis   . Cancer ([place]) 09/27/2017   left breast genetic screening panel 2017 with variant of unknown significance AXINA  . DUB (dysfunctional uterine bleeding)   . Esophageal reflux 08/15/2006   EGD(Chronic)  . Esophageal stricture 08/15/2006   EGD  . Family history of breast cancer   . Family history of colon cancer   . Hiatal hernia 08/15/2006   EGD  . Hyperthyroidism 2010   no meds now  . Insomnia   . Osteopenia 02/2018   T score -2.4 distal third of radius.  FRAX 11% / 1.8% stable at other points of interest to include spine, right and left hip  . Personal history of colonic polyps 09/06/2001   hyperplastic  . Pityriasis rosea   . PONV (postoperative nausea and vomiting)   . Primary osteoarthritis of left knee    Severe Patellofemoral arthritis  . Rhomboid pain    Right and right trapezius with concomitant cervical spondylosis  Past Surgical History:  Procedure Laterality Date  . BREAST LUMPECTOMY WITH RADIOACTIVE SEED AND SENTINEL LYMPH NODE BIOPSY Left 11/02/2017   Procedure: LEFT BREAST LUMPECTOMY WITH RADIOACTIVE SEED AND LEFT AXILLARY DEEP SENTINEL LYMPH NODE BIOPSY, INJECT BLUE DYE LEFT BREAST;  Surgeon: [name], MD;  Location: [place];  Service: General;  Laterality: Left;  . BREAST SURGERY  2000   Breast cyst  removed, left  . TONSILLECTOMY    . VAGINAL HYSTERECTOMY  1991   Right ovarian cystectomy  . VIDEO BRONCHOSCOPY Bilateral 03/16/2015   Procedure: VIDEO BRONCHOSCOPY WITHOUT FLUORO;  Surgeon: Brand Males, MD;  Location[facility] END[facility]COPY;  Service: Endoscopy;  Laterality: Bilateral;     ALLERGIES:  Allergies  Allergen Reactions  . Codeine Nausea Only  . Propranolol Hcl Other (See Comments)    dizziness     CURRENT MEDICATIONS:  Outpatient Encounter Medications as of 04/30/2018  Medication Sig  . aspirin EC 81 MG tablet Take 81 mg by mouth daily.  . Cholecalciferol (VITAMIN D) 2000 units CAPS Take 2,000 Units by mouth daily.  . clorazepate (TRANXENE) 7.5 MG tablet Take 7.5 mg by mouth at bedtime as needed for anxiety.  . cyanocobalamin 1000 MCG tablet Take 1,000 mcg by mouth daily.  . famotidine (PEPCID) 20 MG tablet Take 1 tablet by mouth at bedtime MUST HAVE OFFICE VISIT FOR FURTHER REFILLS  . letrozole (FEMARA) 2.5 MG tablet Take 1 tablet (2.5 mg total) by mouth daily.  Marland Kitchen omeprazole (PRILOSEC) 40 MG capsule Take 1 capsule (40 mg total) by mouth every morning. MUST HAVE OFFICE VISIT FOR FURTHER REFILLS   No facility-administered encounter medications on file as of 04/30/2018.      ONCOLOGIC FAMILY HISTORY:  Family History  Problem Relation Age of Onset  . Osteoporosis Mother   . Lung cancer Mother   . Stroke Mother   . Diabetes Father   . Hypertension Father   . Osteoporosis Father   . Colon cancer Father 40  . Breast cancer Sister        Age 68  . Ovarian cancer Maternal Aunt 60  . Breast cancer Cousin        maternal-Age 67  . Colon cancer Paternal Uncle 33  . Colon cancer Cousin        maternal first cousin  . Breast cancer Maternal Aunt        great aunt- Age unknown  . Crohn's disease Son      SOCIAL HISTORY:  Social History   Socioeconomic History  . Marital status: Divorced    Spouse name: Not on file  . Number of children: 2  . Years of education:  Not on file  . Highest education level: Not on file  Occupational History  . Occupation: Pharmacist, hospital, works part time     Fish farm manager: [employer]  . Financial resource strain: Not on file  . Food insecurity:    Worry: Not on file    Inability: Not on file  . Transportation needs:    Medical: Not on file    Non-medical: Not on file  Tobacco Use  . Smoking status: Former Smoker    Last attempt to quit: 10/31/1995    Years since quitting: 22.5  . Smokeless tobacco: Never Used  Substance and Sexual Activity  . Alcohol use: Yes    Alcohol/week: 0.0 oz    Comment: occ  . Drug use: No  . Sexual activity: Not Currently  Birth control/protection: Surgical    Comment: 1st intercourse 72 yo-Fewer than 5 partners  Lifestyle  . Physical activity:    Days per week: Not on file    Minutes per session: Not on file  . Stress: Not on file  Relationships  . Social connections:    Talks on phone: Not on file    Gets together: Not on file    Attends religious service: Not on file    Active member of club or organization: Not on file    Attends meetings of clubs or organizations: Not on file    Relationship status: Not on file  . Intimate partner violence:    Fear of current or ex partner: Not on file    Emotionally abused: Not on file    Physically abused: Not on file    Forced sexual activity: Not on file  Other Topics Concern  . Not on file  Social History Narrative   Lives by herself   2 children, one has mental issues      PHYSICAL EXAMINATION:  Vital Signs:   Vitals:   04/30/18 1419  BP: 104/60  Pulse: 83  Resp: 18  Temp: 98.4 F (36.9 C)  SpO2: 99%   Filed Weights   04/30/18 1419  Weight: 151 lb 6.4 oz (68.7 kg)   General: Well-nourished, well-appearing female in no acute distress.  She is unaccompanied today.   HEENT: Head is normocephalic.  Pupils equal and reactive to light. Conjunctivae clear without exudate.  Sclerae anicteric. Oral mucosa  is pink, moist.  Oropharynx is pink without lesions or erythema.  Lymph: No cervical, supraclavicular, or infraclavicular lymphadenopathy noted on palpation.  Cardiovascular: Regular rate and rhythm.Marland Kitchen Respiratory: Clear to auscultation bilaterally. Chest expansion symmetric; breathing non-labored.  Breasts: left breast s/p lumpectomy and radiation, no nodules, masses noted, mild amt of scar tissue at lumpectomy site.  Right breast without nodules masses skin or nipple changes GI: Abdomen soft and round; non-tender, non-distended. Bowel sounds normoactive.  GU: Deferred.  Neuro: No focal deficits. Steady gait.  Psych: Mood and affect normal and appropriate for situation.  Extremities: No edema. MSK: No focal spinal tenderness to palpation.  Full range of motion in bilateral upper extremities Skin: Warm and dry.  LABORATORY DATA:  None for this visit.  DIAGNOSTIC IMAGING:  None for this visit.      ASSESSMENT AND PLAN:  Ms.. Cynthia Thornton is a pleasant 72 y.o. female with Stage IA left breast invasive ductal carcinoma, ER+/PR+/HER2-, diagnosed in 08/2017, treated with lumpectomy, adjuvant radiation therapy, and anti-estrogen therapy with Letrozole beginning in 01/2018.  She presents to the Survivorship Clinic for our initial meeting and routine follow-up post-completion of treatment for breast cancer.    1. Stage IA left breast cancer:  Ms. Cassel is continuing to recover from definitive treatment for breast cancer. She will follow-up with her medical oncologist, Dr. [name] in 4 weeks with history and physical exam per surveillance protocol.  Today, a comprehensive survivorship care plan and treatment summary was reviewed with the patient today detailing her breast cancer diagnosis, treatment course, potential late/long-term effects of treatment, appropriate follow-up care with recommendations for the future, and patient education resources.  A copy of this summary, along with a letter will be sent to  the patient's primary care provider via mail/fax/In Basket message after today's visit.    2. Letrozole difficulty: She is not tolerating the Letrozole well at all.  After a long discussion, I urged her to stop  taking it.  We reviewed her bone density test as well.  She may benefit from Tamoxifen.  We reviewed her risk of recurrence outside of the breast without anti estrogens which is about 8% instead of 4% with them.  She is going to consider her options and return in 4 weeks to review in further detail with Dr. [name].    3. Bone health:  Given Ms. Rosenstock's age/history of breast cancer and her current treatment regimen including anti-estrogen therapy with Letrozole, she is at risk for bone demineralization.  She did undergo DEXA in 02/2018 that demonstrated a t score of -2.5 in the left forearm.  We reviewed the possibility of bisphosphanate therapy, but for now she was recommended calcium, vitamin d, and weight bearing exercises.  She was given education on specific activities to promote bone health.  4. Cancer screening:  Due to Ms. Mader's history and her age, she should receive screening for skin cancers, colon cancer, and gynecologic cancers.  The information and recommendations are listed on the patient's comprehensive care plan/treatment summary and were reviewed in detail with the patient.    5. Health maintenance and wellness promotion: Ms. Hartin was encouraged to consume 5-7 servings of fruits and vegetables per day. We reviewed the "Nutrition Rainbow" handout, as well as the handout "Take Control of Your Health and Reduce Your Cancer Risk" from the [employer].  She was also encouraged to engage in moderate to vigorous exercise for 30 minutes per day most days of the week. We discussed the [employer] fitness program, which is designed for cancer survivors to help them become more physically fit after cancer treatments.  She was instructed to limit her alcohol consumption and  continue to abstain from tobacco use.     6. Support services/counseling: It is not uncommon for this period of the patient's cancer care trajectory to be one of many emotions and stressors.  We discussed an opportunity for her to participate in the next session of [facility] ("Finding Your New Normal") support group series designed for patients after they have completed treatment.   Ms. Propst was encouraged to take advantage of our many other support services programs, support groups, and/or counseling in coping with her new life as a cancer survivor after completing anti-cancer treatment.  She was given information regarding our available services and encouraged to contact me with any questions or for help enrolling in any of our support group/programs.    Dispo:   -Return to cancer center in 4 weeks for f/u with Dr. [name]  -Mammogram due in 08/2018 -Bone density due 02/2020 -She is welcome to return back to the Survivorship Clinic at any time; no additional follow-up needed at this time.  -Consider referral back to survivorship as a long-term survivor for continued surveillance  A total of (50) minutes of face-to-face time was spent with this patient with greater than 50% of that time in counseling and care-coordination.   Gardenia Phlegm, NP Survivorship Program [facility] [phone]   Note: PRIMARY CARE PROVIDER Colon Branch, [place] [phone]

## 2018-04-30 NOTE — Telephone Encounter (Signed)
Gave patient avs and calendar of upcoming august appts.  °

## 2018-05-01 ENCOUNTER — Encounter: Payer: Self-pay | Admitting: Adult Health

## 2018-05-17 ENCOUNTER — Telehealth: Payer: Self-pay | Admitting: Internal Medicine

## 2018-05-17 NOTE — Telephone Encounter (Signed)
Pt last seen by MR 06/22/17 and at last OV, it was stated for pt to return in 1 year for f/u.  Pt has not yet been scheduled for f/u appt.  Attempted to call pt to see if we could schedule f/u appt with MR but no answer.  Left message for pt to return call x1

## 2018-05-20 NOTE — Telephone Encounter (Signed)
Pt returning call. Pt sched 29mo ROV for 9/6 w/MR.

## 2018-06-05 ENCOUNTER — Inpatient Hospital Stay: Payer: Medicare Other | Attending: Adult Health | Admitting: Hematology and Oncology

## 2018-06-05 ENCOUNTER — Telehealth: Payer: Self-pay | Admitting: Hematology and Oncology

## 2018-06-05 DIAGNOSIS — F329 Major depressive disorder, single episode, unspecified: Secondary | ICD-10-CM | POA: Diagnosis not present

## 2018-06-05 DIAGNOSIS — Z170 Estrogen receptor positive status [ER+]: Secondary | ICD-10-CM | POA: Insufficient documentation

## 2018-06-05 DIAGNOSIS — Z923 Personal history of irradiation: Secondary | ICD-10-CM

## 2018-06-05 DIAGNOSIS — Z79899 Other long term (current) drug therapy: Secondary | ICD-10-CM | POA: Insufficient documentation

## 2018-06-05 DIAGNOSIS — C50312 Malignant neoplasm of lower-inner quadrant of left female breast: Secondary | ICD-10-CM

## 2018-06-05 DIAGNOSIS — Z79811 Long term (current) use of aromatase inhibitors: Secondary | ICD-10-CM | POA: Diagnosis not present

## 2018-06-05 DIAGNOSIS — N951 Menopausal and female climacteric states: Secondary | ICD-10-CM | POA: Insufficient documentation

## 2018-06-05 DIAGNOSIS — Z7982 Long term (current) use of aspirin: Secondary | ICD-10-CM

## 2018-06-05 MED ORDER — VENLAFAXINE HCL ER 37.5 MG PO CP24: 37.5000 mg | ORAL_CAPSULE | Freq: Every day | ORAL | 6 refills | Status: DC

## 2018-06-05 MED ORDER — ANASTROZOLE 1 MG PO TABS: 1.0000 mg | ORAL_TABLET | Freq: Every day | ORAL | 0 refills | Status: DC

## 2018-06-05 NOTE — Telephone Encounter (Signed)
Gave patient avs report and appointments for October  °

## 2018-06-05 NOTE — Assessment & Plan Note (Signed)
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 Oncotype DX 16: 4% risk of recurrence with 5 years of tamoxifen: Low risk 2. Adjuvant radiation therapy 12/19/2017 to 01/11/2018 3. Adjuvant antiestrogen therapy: With letrozole 2.5 mg daily to start 01/28/2018  Letrozole toxicities:  Return to clinic in 1 year for follow-up

## 2018-06-05 NOTE — Progress Notes (Signed)
Patient Care Team: Colon Branch, MD as PCP - Gaston Islam, MD as Consulting Physician (Orthopedic Surgery) Nicholas Lose, MD as Consulting Physician (Hematology and Oncology) [name], MD as Consulting Physician (General Surgery) Fontaine, Belinda Block, MD as Consulting Physician (Gynecology) Brand Males, MD as Consulting Physician (Pulmonary Disease) Kyung Rudd, MD as Consulting Physician (Radiation Oncology) Delice Bison Charlestine Massed, NP as Nurse Practitioner (Hematology and Oncology)  DIAGNOSIS:  Encounter Diagnosis  Name Primary?  . Malignant neoplasm of lower-inner quadrant of left breast in female, estrogen receptor positive ([place])     SUMMARY OF ONCOLOGIC HISTORY:   Malignant neoplasm of lower-inner quadrant of left breast in female, estrogen receptor positive ([place])   09/26/2017 Initial Diagnosis    Screening detected left breast calcifications 1.1 cm span axilla negative, biopsy revealed grade 2 invasive ductal carcinoma with DCIS ER 100% PR 2%, HER-2 negative ratio 0.78, Ki-67 20%, T1CN0 stage Ia clinical stage AJCC 8      11/02/2017 Surgery    Left Lumpectomy: IDC grade 2, 1.2 cm, 0/3 LN Neg, , ER 100% PR 2%, HER-2 negative ratio 0.78, Ki-67 20% T1c N0 Stage 1A      12/19/2017 - 01/09/2018 Radiation Therapy    Adjuvant radiation therapy      01/2018 -  Anti-estrogen oral therapy    Letrozole daily       CHIEF COMPLIANT: Severe side effects to letrozole therapy  INTERVAL HISTORY: Cynthia Thornton is a 72 year old with above-mentioned history of left breast cancer treated with lumpectomy radiation and took letrozole briefly and she had to stop it last month because of severe side effects.  She experience profound hot flashes.  But she also experienced continuous crying spells throughout the day and emotional lability.  She stopped it about a month ago and her symptoms are getting better.  REVIEW OF SYSTEMS:   Constitutional: Denies fevers, chills or  abnormal weight loss Eyes: Denies blurriness of vision Ears, nose, mouth, throat, and face: Denies mucositis or sore throat Respiratory: Denies cough, dyspnea or wheezes Cardiovascular: Denies palpitation, chest discomfort Gastrointestinal:  Denies nausea, heartburn or change in bowel habits Skin: Denies abnormal skin rashes Lymphatics: Denies new lymphadenopathy or easy bruising Neurological:Denies numbness, tingling or new weaknesses Behavioral/Psych: Severe depression Extremities: No lower extremity edema Breast:  denies any pain or lumps or nodules in either breasts All other systems were reviewed with the patient and are negative.  I have reviewed the past medical history, past surgical history, social history and family history with the patient and they are unchanged from previous note.  ALLERGIES:  is allergic to codeine and propranolol hcl.  MEDICATIONS:  Current Outpatient Medications  Medication Sig Dispense Refill  . aspirin EC 81 MG tablet Take 81 mg by mouth daily.    . Cholecalciferol (VITAMIN D) 2000 units CAPS Take 2,000 Units by mouth daily.    . clorazepate (TRANXENE) 7.5 MG tablet Take 7.5 mg by mouth at bedtime as needed for anxiety.    . cyanocobalamin 1000 MCG tablet Take 1,000 mcg by mouth daily.    . famotidine (PEPCID) 20 MG tablet Take 1 tablet by mouth at bedtime MUST HAVE OFFICE VISIT FOR FURTHER REFILLS 90 tablet 3  . letrozole (FEMARA) 2.5 MG tablet Take 1 tablet (2.5 mg total) by mouth daily. 90 tablet 3  . omeprazole (PRILOSEC) 40 MG capsule Take 1 capsule (40 mg total) by mouth every morning. MUST HAVE OFFICE VISIT FOR FURTHER REFILLS 90 capsule 3  No current facility-administered medications for this visit.     PHYSICAL EXAMINATION: ECOG PERFORMANCE STATUS: 1 - Symptomatic but completely ambulatory  Vitals:   06/05/18 1518  BP: 116/68  Pulse: 77  Resp: 18  Temp: 98.7 F (37.1 C)  SpO2: 100%   Filed Weights   06/05/18 1518  Weight: 149  lb 11.2 oz (67.9 kg)    GENERAL:alert, no distress and comfortable SKIN: skin color, texture, turgor are normal, no rashes or significant lesions EYES: normal, Conjunctiva are pink and non-injected, sclera clear OROPHARYNX:no exudate, no erythema and lips, buccal mucosa, and tongue normal  NECK: supple, thyroid normal size, non-tender, without nodularity LYMPH:  no palpable lymphadenopathy in the cervical, axillary or inguinal LUNGS: clear to auscultation and percussion with normal breathing effort HEART: regular rate & rhythm and no murmurs and no lower extremity edema ABDOMEN:abdomen soft, non-tender and normal bowel sounds MUSCULOSKELETAL:no cyanosis of digits and no clubbing  NEURO: alert & oriented x 3 with fluent speech, no focal motor/sensory deficits EXTREMITIES: No lower extremity edema   LABORATORY DATA:  I have reviewed the data as listed CMP Latest Ref Rng & Units 10/03/2017 12/08/2016 08/09/2016  Glucose 70 - 140 mg/dl 90 87 91  BUN 7.0 - 26.0 mg/dL 24._0 Creatinine 0.6 - 1.1 mg/dL 1.0 0.88 0.91  Sodium 136 - 145 mEq/L 134(L) 135 134(L)  Potassium 3.5 - 5.1 mEq/L 4.3 4.6 4.3  Chloride 98 - 110 mmol/L - 100 98  CO2 22 - 29 mEq/L _1 Calcium 8.4 - 10.4 mg/dL 9.7 9.6 9.5  Total Protein 6.4 - 8.3 g/dL 7.7 - 7.1  Total Bilirubin 0.20 - 1.20 mg/dL 0.44 - 0.5  Alkaline Phos 40 - 150 U/L 107 - 94  AST 5 - 34 U/L 21 - 16  ALT 0 - 55 U/L 17 - 13    Lab Results  Component Value Date   WBC 6.2 10/03/2017   HGB 13.0 10/03/2017   HCT 37.6 10/03/2017   MCV 88.3 10/03/2017   PLT 305 10/03/2017   NEUTROABS 3.9 10/03/2017    ASSESSMENT & PLAN:  Malignant neoplasm of lower-inner quadrant of left breast in female, estrogen receptor positive ([place]) 10/31/17: Left Lumpectomy: IDC grade 2, 1.2 cm, 0/3 LN Neg, , ER 100% PR 2%, HER-2 negative ratio 0.78, Ki-67 20% T1c N0 Stage 1A  Treatment summary: 1. Oncotype DX 16: 4% risk of recurrence with 5 years of tamoxifen: Low  risk 2. Adjuvant radiation therapy 12/19/2017 to 01/11/2018 3. Adjuvant antiestrogen therapy: With letrozole 2.5 mg daily to start 01/28/2018  Letrozole toxicities:  1.  Severe hot flashes 2. severe emotional disturbance with crying spells all day Patient stopped letrozole for the past month and her symptoms appear to be getting better. She is extremely sad that she has the side effects as she would like to continue with antiestrogen therapy for breast reduction.  We discussed multiple different options and decided that we could try anastrozole at half a tablet daily for the next 2 months.  We can then decide whether to increase the dosage any further. Major depression severe hot flashes: I sent a prescription for Effexor today.  Return to clinic in 2 months for follow-up   No orders of the defined types were placed in this encounter.  The patient has a good understanding of the overall plan. she agrees with it. she will call with any problems that may develop before the next visit here.   [facility]  Loyal Gambler, MD 06/05/18

## 2018-06-28 ENCOUNTER — Other Ambulatory Visit: Payer: Self-pay | Admitting: Hematology and Oncology

## 2018-06-28 ENCOUNTER — Encounter: Payer: Self-pay | Admitting: Endocrinology

## 2018-06-28 ENCOUNTER — Ambulatory Visit: Payer: Medicare Other | Admitting: Endocrinology

## 2018-06-28 VITALS — BP 110/68 | HR 69 | Temp 98.1°F | Ht 64.0 in | Wt 148.6 lb

## 2018-06-28 DIAGNOSIS — E042 Nontoxic multinodular goiter: Secondary | ICD-10-CM

## 2018-06-28 LAB — T4, FREE: FREE T4: 0.8 ng/dL (ref 0.60–1.60)

## 2018-06-28 LAB — TSH: TSH: 3.4 u[IU]/mL (ref 0.35–4.50)

## 2018-06-28 NOTE — Patient Instructions (Addendum)
blood tests are requested for you today.  We'll let you know about the results. Now that we have seen the thyroid go high then low, we'll have to keep a sharper eye on it, so please come back for a follow-up appointment in 6 months.  Let's recheck the ultrasound.  you will receive a phone call, about a day and time for an appointment.

## 2018-06-28 NOTE — Progress Notes (Signed)
Subjective:    Patient ID: Cynthia Thornton, female    DOB: [birth year], 72 y.o.   MRN: [id number]  HPI pt has h/o hyperthyroidism due to a multinodular goiter (dx'ed 2009; [place] in 2013 showed multiple small nodules (not big enough to need bx), were unchanged; she was given thionamide rx for a few months in 2010, but she went off due to normal TFT).  she does not notice the goiter.  pt states she feels well in general.  Past Medical History:  Diagnosis Date  . Arthritis   . Cancer ([place]) 09/27/2017   left breast genetic screening panel 2017 with variant of unknown significance AXINA  . DUB (dysfunctional uterine bleeding)   . Esophageal reflux 08/15/2006   EGD(Chronic)  . Esophageal stricture 08/15/2006   EGD  . Family history of breast cancer   . Family history of colon cancer   . Hiatal hernia 08/15/2006   EGD  . Hyperthyroidism 2010   no meds now  . Insomnia   . Osteopenia 02/2018   T score -2.4 distal third of radius.  FRAX 11% / 1.8% stable at other points of interest to include spine, right and left hip  . Personal history of colonic polyps 09/06/2001   hyperplastic  . Pityriasis rosea   . PONV (postoperative nausea and vomiting)   . Primary osteoarthritis of left knee    Severe Patellofemoral arthritis  . 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 BREAST LUMPECTOMY WITH RADIOACTIVE SEED AND SENTINEL LYMPH NODE BIOPSY Left 11/02/2017   Procedure: LEFT BREAST LUMPECTOMY WITH RADIOACTIVE SEED AND LEFT AXILLARY DEEP SENTINEL LYMPH NODE BIOPSY, INJECT BLUE DYE LEFT BREAST;  Surgeon: [name], MD;  Location: [place];  Service: General;  Laterality: Left;  . BREAST SURGERY  2000   Breast cyst removed, left  . TONSILLECTOMY    . VAGINAL HYSTERECTOMY  1991   Right ovarian cystectomy  . VIDEO BRONCHOSCOPY Bilateral 03/16/2015   Procedure: VIDEO BRONCHOSCOPY WITHOUT  FLUORO;  Surgeon: Brand Males, MD;  Location: [facility] ENDOSCOPY;  Service: Endoscopy;  Laterality: Bilateral;    Social History   Socioeconomic History  . Marital status: Divorced    Spouse name: Not on file  . Number of children: 2  . Years of education: Not on file  . Highest education level: Not on file  Occupational History  . Occupation: Pharmacist, hospital, works part time     Fish farm manager: [employer]  . Financial resource strain: Not on file  . Food insecurity:    Worry: Not on file    Inability: Not on file  . Transportation needs:    Medical: Not on file    Non-medical: Not on file  Tobacco Use  . Smoking status: Former Smoker    Last attempt to quit: 10/31/1995    Years since quitting: 22.6  . Smokeless tobacco: Never Used  Substance and Sexual Activity  . Alcohol use: Yes    Alcohol/week: 0.0 standard drinks    Comment: occ  . Drug use: No  . Sexual activity: Not Currently    Birth control/protection: Surgical    Comment: 1st intercourse 72 yo-Fewer than 5 partners  Lifestyle  . Physical activity:    Days per week: Not on file    Minutes per session: Not on file  . Stress: Not on file  Relationships  .  Social connections:    Talks on phone: Not on file    Gets together: Not on file    Attends religious service: Not on file    Active member of club or organization: Not on file    Attends meetings of clubs or organizations: Not on file    Relationship status: Not on file  . Intimate partner violence:    Fear of current or ex partner: Not on file    Emotionally abused: Not on file    Physically abused: Not on file    Forced sexual activity: Not on file  Other Topics Concern  . Not on file  Social History Narrative   Lives by herself   2 children, one has mental issues     Current Outpatient Medications on File Prior to Visit  Medication Sig Dispense Refill  . aspirin EC 81 MG tablet Take 81 mg by mouth daily.    . Cholecalciferol (VITAMIN  D) 2000 units CAPS Take 2,000 Units by mouth daily.    . clorazepate (TRANXENE) 7.5 MG tablet Take 7.5 mg by mouth at bedtime as needed for anxiety.    . cyanocobalamin 1000 MCG tablet Take 1,000 mcg by mouth daily.    . famotidine (PEPCID) 20 MG tablet Take 1 tablet by mouth at bedtime MUST HAVE OFFICE VISIT FOR FURTHER REFILLS 90 tablet 3  . omeprazole (PRILOSEC) 40 MG capsule Take 1 capsule (40 mg total) by mouth every morning. MUST HAVE OFFICE VISIT FOR FURTHER REFILLS 90 capsule 3   No current facility-administered medications on file prior to visit.     Allergies  Allergen Reactions  . Codeine Nausea Only  . Propranolol Hcl Other (See Comments)    dizziness    Family History  Problem Relation Age of Onset  . Osteoporosis Mother   . Lung cancer Mother   . Stroke Mother   . Diabetes Father   . Hypertension Father   . Osteoporosis Father   . Colon cancer Father 63  . Breast cancer Sister        Age 59  . Ovarian cancer Maternal Aunt 60  . Breast cancer Cousin        maternal-Age 22  . Colon cancer Paternal Uncle 70  . Colon cancer Cousin        maternal first cousin  . Breast cancer Maternal Aunt        great aunt- Age unknown  . Crohn's disease Son     BP 110/68 (BP Location: Right Arm, Patient Position: Sitting, Cuff Size: Normal)   Pulse 69   Temp 98.1 F (36.7 C) (Oral)   Ht 5\' 4"  (1.626 m)   Wt 148 lb 9.6 oz (67.4 kg)   SpO2 99%   BMI 25.51 kg/m    Review of Systems Denies neck pain.      Objective:   Physical Exam VITAL SIGNS:  See vs page GENERAL: no distress NECK: There is no palpable thyroid enlargement.  No thyroid nodule is palpable.  No palpable lymphadenopathy at the anterior neck.   TSH=5    Assessment & Plan:  Multinodular goiter, due for recheck Hypothyroidism: new.  She has autoimmune thyroid dz, which os prob the cause of the small nodules.   Patient Instructions  blood tests are requested for you today.  We'll let you know about  the results. Now that we have seen the thyroid go high then low, we'll have to keep a sharper eye on it, so  please come back for a follow-up appointment in 6 months.  Let's recheck the ultrasound.  you will receive a phone call, about a day and time for an appointment.

## 2018-07-05 ENCOUNTER — Encounter: Payer: Self-pay | Admitting: Internal Medicine

## 2018-07-05 ENCOUNTER — Ambulatory Visit: Payer: Medicare Other | Admitting: Internal Medicine

## 2018-07-05 VITALS — BP 124/72 | HR 79 | Ht 64.0 in | Wt 147.4 lb

## 2018-07-05 DIAGNOSIS — Z23 Encounter for immunization: Secondary | ICD-10-CM

## 2018-07-05 DIAGNOSIS — J479 Bronchiectasis, uncomplicated: Secondary | ICD-10-CM

## 2018-07-05 MED ORDER — FLUTTER DEVI: 1.0000 [IU] | 0 refills | Status: DC | PRN

## 2018-07-05 NOTE — Progress Notes (Signed)
OV 07/05/2018  Subjective:  Patient ID: Cynthia Thornton, female , DOB: 03-25-1946 , age 72 y.o. , MRN: [id number] , ADDRESS: [address] [place] [address]   07/05/2018 -   Chief Complaint  Patient presents with  . Follow-up    Pt states she is no longer coughing up any blood but states she is coughing up mucus first thing in the morning which she states reminds her of "rubber cement".      HPI Cynthia Thornton 72 y.o. - fever with bronchiectasis with mild sputum production occasional hemoptysis. She is here for annual follow-up. Overall she's doing well except in the last 1 year she had early stage breast cancer status post radiation and lumpectomy. In terms of respiratory symptoms no shortness of breath chest tightness or wheezing. The only issue is that early morning she has a mild cough with mucus production that is discolored. It is stable and does not impair her quality of life but she does wonder it of the sputum. She does not want to any medications for it if possible.     ROS - per HPI     has a past medical history of Arthritis, Cancer ([place]) (09/27/2017), DUB (dysfunctional uterine bleeding), Esophageal reflux (08/15/2006), Esophageal stricture (08/15/2006), Family history of breast cancer, Family history of colon cancer, Hiatal hernia (08/15/2006), Hyperthyroidism (2010), Insomnia, Osteopenia (02/2018), Personal history of colonic polyps (09/06/2001), Pityriasis rosea, PONV (postoperative nausea and vomiting), Primary osteoarthritis of left knee, and Rhomboid pain.   reports that she quit smoking about 22 years ago. She has never used smokeless tobacco.  Past Surgical History:  Procedure Laterality Date  . BREAST LUMPECTOMY WITH RADIOACTIVE SEED AND SENTINEL LYMPH NODE BIOPSY Left 11/02/2017   Procedure: LEFT BREAST LUMPECTOMY WITH RADIOACTIVE SEED AND LEFT AXILLARY DEEP SENTINEL LYMPH NODE BIOPSY, INJECT BLUE DYE LEFT BREAST;  Surgeon: [name], MD;   Location: [facility];  Service: General;  Laterality: Left;  . BREAST SURGERY  2000   Breast cyst removed, left  . TONSILLECTOMY    . VAGINAL HYSTERECTOMY  1991   Right ovarian cystectomy  . VIDEO BRONCHOSCOPY Bilateral 03/16/2015   Procedure: VIDEO BRONCHOSCOPY WITHOUT FLUORO;  Surgeon: Brand Males, MD;  Location: [facility] ENDOSCOPY;  Service: Endoscopy;  Laterality: Bilateral;    Allergies  Allergen Reactions  . Codeine Nausea Only  . Propranolol Hcl Other (See Comments)    dizziness    Immunization History  Administered Date(s) Administered  . Influenza Split 08/31/2011, 07/30/2014  . Influenza Whole 08/30/2009, 07/30/2010  . Influenza,inj,Quad PF,6+ Mos 09/30/2012, 10/10/2013, 07/26/2015  . Influenza-Unspecified 08/18/2016  . Pneumococcal Conjugate-13 10/10/2013  . Pneumococcal Polysaccharide-23 07/27/2015  . Td 12/02/2008  . Zoster 12/02/2008    Family History  Problem Relation Age of Onset  . Osteoporosis Mother   . Lung cancer Mother   . Stroke Mother   . Diabetes Father   . Hypertension Father   . Osteoporosis Father   . Colon cancer Father 20  . Breast cancer Sister        Age 92  . Ovarian cancer Maternal Aunt 60  . Breast cancer Cousin        maternal-Age 70  . Colon cancer Paternal Uncle 50  . Colon cancer Cousin        maternal first cousin  . Breast cancer Maternal Aunt        great aunt- Age unknown  . Crohn's disease Son  Current Outpatient Medications:  .  aspirin EC 81 MG tablet, Take 81 mg by mouth daily., Disp: , Rfl:  .  Cholecalciferol (VITAMIN D) 2000 units CAPS, Take 2,000 Units by mouth daily., Disp: , Rfl:  .  clorazepate (TRANXENE) 7.5 MG tablet, Take 7.5 mg by mouth at bedtime as needed for anxiety., Disp: , Rfl:  .  cyanocobalamin 1000 MCG tablet, Take 1,000 mcg by mouth daily., Disp: , Rfl:  .  famotidine (PEPCID) 20 MG tablet, Take 1 tablet by mouth at bedtime MUST HAVE OFFICE VISIT FOR FURTHER REFILLS, Disp: 90  tablet, Rfl: 3 .  omeprazole (PRILOSEC) 40 MG capsule, Take 1 capsule (40 mg total) by mouth every morning. MUST HAVE OFFICE VISIT FOR FURTHER REFILLS, Disp: 90 capsule, Rfl: 3 .  anastrozole (ARIMIDEX) 1 MG tablet, TAKE 1 TABLET BY MOUTH EVERY DAY (Patient not taking: Reported on 07/05/2018), Disp: 30 tablet, Rfl: 0      Objective:   Vitals:   07/05/18 0856  BP: 124/72  Pulse: 79  SpO2: 97%  Weight: 147 lb 6.4 oz (66.9 kg)  Height: 5\' 4"  (1.626 m)    Estimated body mass index is 25.3 kg/m as calculated from the following:   Height as of this encounter: 5\' 4"  (1.626 m).   Weight as of this encounter: 147 lb 6.4 oz (66.9 kg).  @WEIGHTCHANGE @  [employer]   07/05/18 0856  Weight: 147 lb 6.4 oz (66.9 kg)     Physical Exam  General Appearance:    Alert, cooperative, no distress, appears stated age - yes , sitting on - chair  Head:    Normocephalic, without obvious abnormality, atraumatic  Eyes:    PERRL, conjunctiva/corneas clear,  Ears:    Normal TM's and external ear canals, both ears  Nose:   Nares normal, septum midline, mucosa normal, no drainage    or sinus tenderness. OXYGEN ON  - no . Patient is @ room air   Throat:   Lips, mucosa, and tongue normal; teeth and gums normal. Cyanosis on lips - no  Neck:   Supple, symmetrical, trachea midline, no adenopathy;    thyroid:  no enlargement/tenderness/nodules; no carotid   bruit or JVD  Back:     Symmetric, no curvature, ROM normal, no CVA tenderness  Lungs:     Distress - no , Wheeze no, Barrell Chest - no, Purse lip breathing - no, Crackles - no   Chest Wall:    No tenderness or deformity. Scars in chest no   Heart:    Regular rate and rhythm, S1 and S2 normal, no murmur, rub   or gallop  Breast Exam:    NOT DONE  Abdomen:     Soft, non-tender, bowel sounds active all four quadrants,    no masses, no organomegaly  Genitalia:   NOT DONE  Rectal:   NOT DONE  Extremities:   Extremities normal, atraumatic, Clubbing - no,  Edema - no  Pulses:   2+ and symmetric all extremities  Skin:   Stigmata of Connective Tissue Disease - no  Lymph nodes:   Cervical, supraclavicular, and axillary nodes normal  Psychiatric:  Neurologic:   pleasant CNII-XII intact, normal strength, sensation  throughout           Assessment:       ICD-10-CM   1. Bronchiectasis without complication ([place]) J82.5        Plan:       Well but having mild early morning mucus  Plan - discussed options of spiriva, mucinex, flutter valve - we decided to go with flutter valve 10 times daily - high dose flu shot 07/05/2018  followup 3-4 months to report progress or sooner if needed   (> 50% of this 15 min visit spent in face to face counseling or/and coordination of care by this undersigned MD - Dr Brand Males. This includes one or more of the following documented above: discussion of test results, diagnostic or treatment recommendations, prognosis, risks and benefits of management options, instructions, education, compliance or risk-factor reduction)    SIGNATURE    Dr. Brand Males, M.D., F.C.C.P,  Pulmonary and Critical Care Medicine Staff Physician, [place] Director - Interstitial Lung Disease  Program  P[facility] [place] at [place], [place], [address]  Pager: [phone], If no answer or between  15:00h - 7:00h: call [phone] Telephone: [phone]  9:11 AM 07/05/2018

## 2018-07-05 NOTE — Patient Instructions (Signed)
ICD-10-CM   1. Bronchiectasis without complication ([place]) D97.4      Well but having mild early morning mucus  Plan - discussed options of spiriva, mucinex, flutter valve - we decided to go with flutter valve 10 times daily - high dose flu shot 07/05/2018  followup 3-4 months to report progress or sooner if needed

## 2018-07-07 ENCOUNTER — Other Ambulatory Visit: Payer: Self-pay | Admitting: Hematology and Oncology

## 2018-07-08 ENCOUNTER — Ambulatory Visit
Admission: RE | Admit: 2018-07-08 | Discharge: 2018-07-08 | Disposition: A | Discharge status: Home / Self Care | Payer: Medicare Other | Source: Ambulatory Visit | Attending: Endocrinology | Admitting: Endocrinology

## 2018-07-08 DIAGNOSIS — E042 Nontoxic multinodular goiter: Secondary | ICD-10-CM

## 2018-07-31 ENCOUNTER — Telehealth: Payer: Self-pay | Admitting: Hematology and Oncology

## 2018-07-31 ENCOUNTER — Ambulatory Visit: Payer: Medicare Other | Admitting: Hematology and Oncology

## 2018-07-31 ENCOUNTER — Telehealth: Payer: Self-pay

## 2018-07-31 NOTE — Telephone Encounter (Signed)
Patient left voicemail to cancel appointment for today d/t car trouble.   Nurse cancelled appointment.  Left message with patient informing of cancellation and left contact information for patient to reschedule appointment.

## 2018-07-31 NOTE — Assessment & Plan Note (Deleted)
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 Oncotype DX16: 4% risk of recurrence with 5 years of tamoxifen: Low risk 2. Adjuvant radiation therapy2/20/2019 to 01/11/2018 3. Adjuvant antiestrogen therapy: With letrozole 2.5 mg daily to start 01/28/2018  Letrozole toxicities:  1.  Severe hot flashes 2. severe emotional disturbance with crying spells all day Patient stopped letrozole for the past month and her symptoms appear to be getting better.  Current treatment: Letrozole half a tablet daily Depression: Effexor  Return to clinic in 6 months for follow-up

## 2018-07-31 NOTE — Telephone Encounter (Signed)
Patient called in to reschedule  °

## 2018-08-05 ENCOUNTER — Telehealth: Payer: Self-pay | Admitting: Hematology and Oncology

## 2018-08-05 ENCOUNTER — Inpatient Hospital Stay: Payer: Medicare Other | Attending: Adult Health | Admitting: Hematology and Oncology

## 2018-08-05 DIAGNOSIS — Z79811 Long term (current) use of aromatase inhibitors: Secondary | ICD-10-CM

## 2018-08-05 DIAGNOSIS — Z79899 Other long term (current) drug therapy: Secondary | ICD-10-CM

## 2018-08-05 DIAGNOSIS — Z7982 Long term (current) use of aspirin: Secondary | ICD-10-CM

## 2018-08-05 DIAGNOSIS — Z510 Encounter for antineoplastic radiation therapy: Secondary | ICD-10-CM

## 2018-08-05 DIAGNOSIS — C50312 Malignant neoplasm of lower-inner quadrant of left female breast: Secondary | ICD-10-CM | POA: Diagnosis not present

## 2018-08-05 DIAGNOSIS — Z170 Estrogen receptor positive status [ER+]: Secondary | ICD-10-CM | POA: Diagnosis not present

## 2018-08-05 NOTE — Assessment & Plan Note (Addendum)
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 Oncotype DX16: 4% risk of recurrence with 5 years of tamoxifen: Low risk 2. Adjuvant radiation therapy2/20/2019 to 01/11/2018 3. Adjuvant antiestrogen therapy: With letrozole 2.5 mg daily to started  01/28/2018 stopped within a few months because of hot flashes and depression  She could not tolerate Effexor because of nausea and vomiting. She does not want to take any further antiestrogen therapies  Mammograms will be done in November. Return to clinic in 1 year for follow-up.

## 2018-08-05 NOTE — Progress Notes (Signed)
Patient Care Team: Colon Branch, MD as PCP - Gaston Islam, MD as Consulting Physician (Orthopedic Surgery) Nicholas Lose, MD as Consulting Physician (Hematology and Oncology) [name], MD as Consulting Physician (General Surgery) Fontaine, Belinda Block, MD as Consulting Physician (Gynecology) Brand Males, MD as Consulting Physician (Pulmonary Disease) Kyung Rudd, MD as Consulting Physician (Radiation Oncology) Delice Bison Charlestine Massed, NP as Nurse Practitioner (Hematology and Oncology)  DIAGNOSIS:  Encounter Diagnosis  Name Primary?  . Malignant neoplasm of lower-inner quadrant of left breast in female, estrogen receptor positive ([place])     SUMMARY OF ONCOLOGIC HISTORY:   Malignant neoplasm of lower-inner quadrant of left breast in female, estrogen receptor positive ([place])   09/26/2017 Initial Diagnosis    Screening detected left breast calcifications 1.1 cm span axilla negative, biopsy revealed grade 2 invasive ductal carcinoma with DCIS ER 100% PR 2%, HER-2 negative ratio 0.78, Ki-67 20%, T1CN0 stage Ia clinical stage AJCC 8    11/02/2017 Surgery    Left Lumpectomy: IDC grade 2, 1.2 cm, 0/3 LN Neg, , ER 100% PR 2%, HER-2 negative ratio 0.78, Ki-67 20% T1c N0 Stage 1A    12/19/2017 - 01/09/2018 Radiation Therapy    Adjuvant radiation therapy    01/2018 - 04/05/2018 Anti-estrogen oral therapy    Letrozole daily stopped because of depression and hot flashes and myalgias     CHIEF COMPLIANT: Unable to tolerate antiestrogen therapy  INTERVAL HISTORY: Cynthia Thornton is a 72 year old with above-mentioned history of left breast cancer treated with lumpectomy radiation and could not tolerate antiestrogen therapy with letrozole.  She is stopped taking it because of diffuse muscle aches and pains.  Once she stopped it the muscle aches and pains went away.  She also felt severely depressed and tearful on the medication.  The symptoms have also improved coming off of them.  She  does not want to take any further antiestrogen therapy.  REVIEW OF SYSTEMS:   Constitutional: Denies fevers, chills or abnormal weight loss Eyes: Denies blurriness of vision Ears, nose, mouth, throat, and face: Denies mucositis or sore throat Respiratory: Denies cough, dyspnea or wheezes Cardiovascular: Denies palpitation, chest discomfort Gastrointestinal:  Denies nausea, heartburn or change in bowel habits Skin: Denies abnormal skin rashes Lymphatics: Denies new lymphadenopathy or easy bruising Neurological:Denies numbness, tingling or new weaknesses Behavioral/Psych: Mood is stable, no new changes  Extremities: No lower extremity edema   All other systems were reviewed with the patient and are negative.  I have reviewed the past medical history, past surgical history, social history and family history with the patient and they are unchanged from previous note.  ALLERGIES:  is allergic to codeine and propranolol hcl.  MEDICATIONS:  Current Outpatient Medications  Medication Sig Dispense Refill  . aspirin EC 81 MG tablet Take 81 mg by mouth daily.    . Cholecalciferol (VITAMIN D) 2000 units CAPS Take 2,000 Units by mouth daily.    . clorazepate (TRANXENE) 7.5 MG tablet Take 7.5 mg by mouth at bedtime as needed for anxiety.    . cyanocobalamin 1000 MCG tablet Take 1,000 mcg by mouth daily.    . famotidine (PEPCID) 20 MG tablet Take 1 tablet by mouth at bedtime MUST HAVE OFFICE VISIT FOR FURTHER REFILLS 90 tablet 3  . omeprazole (PRILOSEC) 40 MG capsule Take 1 capsule (40 mg total) by mouth every morning. MUST HAVE OFFICE VISIT FOR FURTHER REFILLS 90 capsule 3  . Respiratory Therapy Supplies (FLUTTER) DEVI 1 Units by Does not  apply route as needed. 1 each 0   No current facility-administered medications for this visit.     PHYSICAL EXAMINATION: ECOG PERFORMANCE STATUS: 1 - Symptomatic but completely ambulatory  Vitals:   08/05/18 1430  BP: 123/75  Pulse: 71  Resp: 19  Temp:  98.3 F (36.8 C)  SpO2: 100%   Filed Weights   08/05/18 1430  Weight: 144 lb 12.8 oz (65.7 kg)    GENERAL:alert, no distress and comfortable SKIN: skin color, texture, turgor are normal, no rashes or significant lesions EYES: normal, Conjunctiva are pink and non-injected, sclera clear OROPHARYNX:no exudate, no erythema and lips, buccal mucosa, and tongue normal  NECK: supple, thyroid normal size, non-tender, without nodularity LYMPH:  no palpable lymphadenopathy in the cervical, axillary or inguinal LUNGS: clear to auscultation and percussion with normal breathing effort HEART: regular rate & rhythm and no murmurs and no lower extremity edema ABDOMEN:abdomen soft, non-tender and normal bowel sounds MUSCULOSKELETAL:no cyanosis of digits and no clubbing  NEURO: alert & oriented x 3 with fluent speech, no focal motor/sensory deficits EXTREMITIES: No lower extremity edema    LABORATORY DATA:  I have reviewed the data as listed CMP Latest Ref Rng & Units 10/03/2017 12/08/2016 08/09/2016  Glucose 70 - 140 mg/dl 90 87 91  BUN 7.0 - 26.0 mg/dL 24.3 14 12   Creatinine 0.6 - 1.1 mg/dL 1.0 0.88 0.91  Sodium 136 - 145 mEq/L 134(L) 135 134(L)  Potassium 3.5 - 5.1 mEq/L 4.3 4.6 4.3  Chloride 98 - 110 mmol/L - 100 98  CO2 22 - 29 mEq/L 23 24 28   Calcium 8.4 - 10.4 mg/dL 9.7 9.6 9.5  Total Protein 6.4 - 8.3 g/dL 7.7 - 7.1  Total Bilirubin 0.20 - 1.20 mg/dL 0.44 - 0.5  Alkaline Phos 40 - 150 U/L 107 - 94  AST 5 - 34 U/L 21 - 16  ALT 0 - 55 U/L 17 - 13    Lab Results  Component Value Date   WBC 6.2 10/03/2017   HGB 13.0 10/03/2017   HCT 37.6 10/03/2017   MCV 88.3 10/03/2017   PLT 305 10/03/2017   NEUTROABS 3.9 10/03/2017    ASSESSMENT & PLAN:  Malignant neoplasm of lower-inner quadrant of left breast in female, estrogen receptor positive ([place]) 10/31/17: Left Lumpectomy: IDC grade 2, 1.2 cm, 0/3 LN Neg, , ER 100% PR 2%, HER-2 negative ratio 0.78, Ki-67 20% T1c N0 Stage 1A  Treatment  summary: 1. Oncotype DX16: 4% risk of recurrence with 5 years of tamoxifen: Low risk 2. Adjuvant radiation therapy2/20/2019 to 01/11/2018 3. Adjuvant antiestrogen therapy: With letrozole 2.5 mg daily to started  01/28/2018 stopped within a few months because of hot flashes and depression  She could not tolerate Effexor because of nausea and vomiting. She does not want to take any further antiestrogen therapies  Mammograms will be done in November. Return to clinic in 1 year for follow-up.    Orders Placed This Encounter  Procedures  . MM DIAG BREAST TOMO BILATERAL    Standing Status:   Future    Standing Expiration Date:   08/06/2019    Order Specific Question:   Reason for Exam (SYMPTOM  OR DIAGNOSIS REQUIRED)    Answer:   Annual mammograms with History of breast cancer    Order Specific Question:   Preferred imaging location?    Answer:   External    Comments:   [facility]   The patient has a good understanding of the overall plan. she  agrees with it. she will call with any problems that may develop before the next visit here.   Harriette Ohara, MD 08/05/18

## 2018-08-05 NOTE — Telephone Encounter (Signed)
Ave pt avs and calendar

## 2018-08-29 ENCOUNTER — Telehealth: Payer: Self-pay | Admitting: Internal Medicine

## 2018-08-29 NOTE — Telephone Encounter (Signed)
Spoke with pt. She is aware of MR's response. Nothing further was needed. 

## 2018-08-29 NOTE — Telephone Encounter (Signed)
Attempted to contact pt. I did not receive an answer. I have left a message for pt to return our call.  
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
## 2018-08-29 NOTE — Telephone Encounter (Signed)
Pt is calling back [phone] 

## 2018-08-29 NOTE — Telephone Encounter (Signed)
Just monitor Reassure "this too will pass" If chest tightness, wheezing , dyspnea happen - to call us

## 2018-09-04 ENCOUNTER — Ambulatory Visit: Payer: Medicare Other | Admitting: Medical

## 2018-09-04 ENCOUNTER — Encounter: Payer: Self-pay | Admitting: Medical

## 2018-09-04 VITALS — HR 69 | Temp 98.2°F | Resp 16 | Ht 64.0 in | Wt 144.4 lb

## 2018-09-04 DIAGNOSIS — R300 Dysuria: Secondary | ICD-10-CM | POA: Diagnosis not present

## 2018-09-04 DIAGNOSIS — R319 Hematuria, unspecified: Secondary | ICD-10-CM

## 2018-09-04 DIAGNOSIS — Z87891 Personal history of nicotine dependence: Secondary | ICD-10-CM

## 2018-09-04 DIAGNOSIS — N390 Urinary tract infection, site not specified: Secondary | ICD-10-CM | POA: Diagnosis not present

## 2018-09-04 LAB — POC URINALSYSI DIPSTICK (AUTOMATED)
BILIRUBIN UA: NEGATIVE
GLUCOSE UA: NEGATIVE
Ketones, UA: NEGATIVE
LEUKOCYTES UA: NEGATIVE
NITRITE UA: NEGATIVE
Protein, UA: NEGATIVE
Spec Grav, UA: 1.01 (ref 1.010–1.025)
UROBILINOGEN UA: NEGATIVE U/dL — AB
pH, UA: 6 (ref 5.0–8.0)

## 2018-09-04 MED ORDER — CIPROFLOXACIN HCL 500 MG PO TABS: 500.0000 mg | ORAL_TABLET | Freq: Two times a day (BID) | ORAL | 0 refills | Status: DC

## 2018-09-04 MED ORDER — PHENAZOPYRIDINE HCL 200 MG PO TABS: 200.0000 mg | ORAL_TABLET | Freq: Three times a day (TID) | ORAL | 0 refills | Status: DC | PRN

## 2018-09-04 NOTE — Progress Notes (Signed)
Subjective:    Patient ID: Cynthia Thornton, female    DOB: 07/14/1946, 72 y.o.   MRN: [id number]  HPI   Pt in today reporting urinary symptoms 1 week ago on and off.  Dysuria- mild hurting all the time but worse when urinates. Frequent urination-yes Hesitancy-slight. Suprapubic pressure-some Fever-no chills-no Nausea-no Vomiting-no CVA pain-no History of UTI-last one  6 weeks ago.  Gross hematuria-no  Pt never got repeat urine test after she was treated for uti 6 wks ago. She states at that time told had trace blood.    Review of Systems  Constitutional: Negative for chills, fatigue and fever.  Respiratory: Negative for cough, chest tightness, shortness of breath and wheezing.   Cardiovascular: Negative for chest pain and palpitations.  Gastrointestinal: Negative for abdominal pain.  Genitourinary: Positive for dysuria, frequency and urgency. Negative for hematuria and vaginal pain.  Musculoskeletal: Negative for back pain.  Skin: Negative for rash.  Neurological: Negative for dizziness and light-headedness.  Hematological: Negative for adenopathy. Does not bruise/bleed easily.  Psychiatric/Behavioral: Negative for behavioral problems and confusion.    Past Medical History:  Diagnosis Date  . Arthritis   . Cancer ([place]) 09/27/2017   left breast genetic screening panel 2017 with variant of unknown significance AXINA  . DUB (dysfunctional uterine bleeding)   . Esophageal reflux 08/15/2006   EGD(Chronic)  . Esophageal stricture 08/15/2006   EGD  . Family history of breast cancer   . Family history of colon cancer   . Hiatal hernia 08/15/2006   EGD  . Hyperthyroidism 2010   no meds now  . Insomnia   . Osteopenia 02/2018   T score -2.4 distal third of radius.  FRAX 11% / 1.8% stable at other points of interest to include spine, right and left hip  . Personal history of colonic polyps 09/06/2001   hyperplastic  . Pityriasis rosea   . PONV (postoperative nausea and  vomiting)   . Primary osteoarthritis of left knee    Severe Patellofemoral arthritis  . 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 Marital status: Divorced    Spouse name: Not on file  . Number of children: 2  . Years of education: Not on file  . Highest education level: Not on file  Occupational History  . Occupation: Pharmacist, hospital, works part time     Fish farm manager: [employer]  . Financial resource strain: Not on file  . Food insecurity:    Worry: Not on file    Inability: Not on file  . Transportation needs:    Medical: Not on file    Non-medical: Not on file  Tobacco Use  . Smoking status: Former Smoker    Last attempt to quit: 10/31/1995    Years since quitting: 22.8  . Smokeless tobacco: Never Used  Substance and Sexual Activity  . Alcohol use: Yes    Alcohol/week: 0.0 standard drinks    Comment: occ  . Drug use: No  . Sexual activity: Not Currently    Birth control/protection: Surgical    Comment: 1st intercourse 72 yo-Fewer than 5 partners  Lifestyle  . Physical activity:    Days per week: Not on file    Minutes per session: Not on file  . Stress: Not on file  Relationships  . Social connections:    Talks on phone: Not on file  Gets together: Not on file    Attends religious service: Not on file    Active member of club or organization: Not on file    Attends meetings of clubs or organizations: Not on file    Relationship status: Not on file  . Intimate partner violence:    Fear of current or ex partner: Not on file    Emotionally abused: Not on file    Physically abused: Not on file    Forced sexual activity: Not on file  Other Topics Concern  . Not on file  Social History Narrative   Lives by herself   2 children, one has mental issues     Past Surgical History:  Procedure Laterality Date  . BREAST LUMPECTOMY WITH RADIOACTIVE SEED AND SENTINEL  LYMPH NODE BIOPSY Left 11/02/2017   Procedure: LEFT BREAST LUMPECTOMY WITH RADIOACTIVE SEED AND LEFT AXILLARY DEEP SENTINEL LYMPH NODE BIOPSY, INJECT BLUE DYE LEFT BREAST;  Surgeon: [name], MD;  Location: [facility];  Service: General;  Laterality: Left;  . BREAST SURGERY  2000   Breast cyst removed, left  . TONSILLECTOMY    . VAGINAL HYSTERECTOMY  1991   Right ovarian cystectomy  . VIDEO BRONCHOSCOPY Bilateral 03/16/2015   Procedure: VIDEO BRONCHOSCOPY WITHOUT FLUORO;  Surgeon: Brand Males, MD;  [facility]: [facility] ENDOSCOPY;  Service: Endoscopy;  Laterality: Bilateral;    Family History  Problem Relation Age of Onset  . Osteoporosis Mother   . Lung cancer Mother   . Stroke Mother   . Diabetes Father   . Hypertension Father   . Osteoporosis Father   . Colon cancer Father 76  . Breast cancer Sister        Age 24  . Ovarian cancer Maternal Aunt 60  . Breast cancer Cousin        maternal-Age 68  . Colon cancer Paternal Uncle 20  . Colon cancer Cousin        maternal first cousin  . Breast cancer Maternal Aunt        great aunt- Age unknown  . Crohn's disease Son     Allergies  Allergen Reactions  . Codeine Nausea Only  . Propranolol Hcl Other (See Comments)    dizziness    Current Outpatient Medications on File Prior to Visit  Medication Sig Dispense Refill  . aspirin EC 81 MG tablet Take 81 mg by mouth daily.    . Cholecalciferol (VITAMIN D) 2000 units CAPS Take 2,000 Units by mouth daily.    . clorazepate (TRANXENE) 7.5 MG tablet Take 7.5 mg by mouth at bedtime as needed for anxiety.    . cyanocobalamin 1000 MCG tablet Take 1,000 mcg by mouth daily.    . famotidine (PEPCID) 20 MG tablet Take 1 tablet by mouth at bedtime MUST HAVE OFFICE VISIT FOR FURTHER REFILLS 90 tablet 3  . omeprazole (PRILOSEC) 40 MG capsule Take 1 capsule (40 mg total) by mouth every morning. MUST HAVE OFFICE VISIT FOR FURTHER REFILLS 90 capsule 3  . Respiratory Therapy Supplies  (FLUTTER) DEVI 1 Units by Does not apply route as needed. 1 each 0   No current facility-administered medications on file prior to visit.     Pulse 69   Temp 98.2 F (36.8 C) (Oral)   Resp 16   Ht 5\' 4"  (1.626 m)   Wt 144 lb 6.4 oz (65.5 kg)   SpO2 100%   BMI 24.79 kg/m       Objective:  Physical Exam  General Appearance- Not in acute distress.  HEENT Eyes- Scleraeral/Conjuntiva-bilat- Not Yellow. Mouth & Throat- Normal.  Chest and Lung Exam Auscultation: Breath sounds:-Normal. Adventitious sounds:- No Adventitious sounds.  Cardiovascular Auscultation:Rythm - Regular. Heart Sounds -Normal heart sounds.  Abdomen Inspection:-Inspection Normal.  Palpation/Perucssion: Palpation and Percussion of the abdomen reveal- faint suprapubic Tender, No Rebound tenderness, No rigidity(Guarding) and No Palpable abdominal masses.  Liver:-Normal.  Spleen:- Normal.   Back- no cva pain      Assessment & Plan:  Your appear to have a urinary tract infection. I am prescribing cipro antibiotic for the probable infection. Hydrate well. I am sending out a urine culture. During the interim if your signs and symptoms worsen rather than improving please notify us. We will notify your when the culture results are back.  Pyridium for urinary pain.  You asked for urologist referral but first want to evaluate urine culture result before placing the referral.  Follow up in 7 days or as needed.  Mackie Pai, PA-C

## 2018-09-04 NOTE — Patient Instructions (Addendum)
Your appear to have a urinary tract infection. I am prescribing cipro antibiotic for the probable infection. Hydrate well. I am sending out a urine culture. During the interim if your signs and symptoms worsen rather than improving please notify us. We will notify your when the culture results are back.  Pyridium for urinary pain.  You asked for urologist referral but first want to evaluate urine culture result before placing the referral.  Follow up in 7 days or as needed.

## 2018-09-06 ENCOUNTER — Telehealth: Payer: Self-pay | Admitting: Medical

## 2018-09-06 DIAGNOSIS — R319 Hematuria, unspecified: Secondary | ICD-10-CM

## 2018-09-06 DIAGNOSIS — N390 Urinary tract infection, site not specified: Secondary | ICD-10-CM

## 2018-09-06 LAB — URINE CULTURE
MICRO NUMBER: 91336763
SPECIMEN QUALITY: ADEQUATE

## 2018-09-06 NOTE — Telephone Encounter (Signed)
Referral to urologist placed. 

## 2018-09-17 ENCOUNTER — Encounter: Payer: Self-pay | Admitting: Gynecology

## 2018-10-04 ENCOUNTER — Ambulatory Visit: Payer: Medicare Other | Admitting: Internal Medicine

## 2018-10-04 ENCOUNTER — Encounter: Payer: Self-pay | Admitting: Internal Medicine

## 2018-10-04 VITALS — BP 126/74 | HR 82 | Ht 64.0 in | Wt 143.0 lb

## 2018-10-04 DIAGNOSIS — J479 Bronchiectasis, uncomplicated: Secondary | ICD-10-CM

## 2018-10-04 NOTE — Patient Instructions (Signed)
ICD-10-CM   1. Bronchiectasis without complication ([place]) T61.4     Stable and glad flutter valve with as needed mucinex helps  Plan - flutter valve continue - mucinex as needed - Please talk to PCP Colon Branch, MD -  and ensure you get  shingrix (GSK) inactivated vaccine against shingles  Followup 9 months or sooner if needed

## 2018-10-04 NOTE — Progress Notes (Signed)
OV 10/04/2018  Subjective:  Patient ID: Cynthia Thornton, female , DOB: 05/25/1946 , age 72 y.o. , MRN: [id number] , ADDRESS: [address] [place] [address]   10/04/2018 -   Chief Complaint  Patient presents with  . Follow-up    f/u bronchiectasis, pt reports she is doing well , flutter valve has helped her get mucus up, some cough but has gotten better, mucus is thinner     HPI Cynthia Thornton 72 y.o. -presents for follow-up of her mild bronchiectasis with mild sputum production.  Last visit September 2019 we introduce flutter valve after discussing several options because sputum volume had increased.  At this point in time she says flutter valve works Chief Technology Officer.  She occasionally uses Mucinex.  She tries to avoid medicines.  She is up-to-date with her pulmonary vaccines.  She would benefit from a new Shingrix shingles vaccine which she will talk to her primary care about.     ROS - per HPI     has a past medical history of Arthritis, Cancer ([place]) (09/27/2017), DUB (dysfunctional uterine bleeding), Esophageal reflux (08/15/2006), Esophageal stricture (08/15/2006), Family history of breast cancer, Family history of colon cancer, Hiatal hernia (08/15/2006), Hyperthyroidism (2010), Insomnia, Osteopenia (02/2018), Personal history of colonic polyps (09/06/2001), Pityriasis rosea, PONV (postoperative nausea and vomiting), Primary osteoarthritis of left knee, and Rhomboid pain.   reports that she quit smoking about 22 years ago. She has never used smokeless tobacco.  Past Surgical History:  Procedure Laterality Date  . BREAST LUMPECTOMY WITH RADIOACTIVE SEED AND SENTINEL LYMPH NODE BIOPSY Left 11/02/2017   Procedure: LEFT BREAST LUMPECTOMY WITH RADIOACTIVE SEED AND LEFT AXILLARY DEEP SENTINEL LYMPH NODE BIOPSY, INJECT BLUE DYE LEFT BREAST;  Surgeon: [name], MD;  Location: [facility];  Service: General;  Laterality: Left;  . BREAST SURGERY  2000   Breast  cyst removed, left  . TONSILLECTOMY    . VAGINAL HYSTERECTOMY  1991   Right ovarian cystectomy  . VIDEO BRONCHOSCOPY Bilateral 03/16/2015   Procedure: VIDEO BRONCHOSCOPY WITHOUT FLUORO;  Surgeon: Brand Males, MD;  Location: [facility] ENDOSCOPY;  Service: Endoscopy;  Laterality: Bilateral;    Allergies  Allergen Reactions  . Codeine Nausea Only  . Propranolol Hcl Other (See Comments)    dizziness    Immunization History  Administered Date(s) Administered  . Influenza Split 08/31/2011, 07/30/2014  . Influenza Whole 08/30/2009, 07/30/2010  . Influenza, High Dose Seasonal PF 07/05/2018  . Influenza,inj,Quad PF,6+ Mos 09/30/2012, 10/10/2013, 07/26/2015  . Influenza-Unspecified 08/18/2016  . Pneumococcal Conjugate-13 10/10/2013  . Pneumococcal Polysaccharide-23 07/27/2015  . Td 12/02/2008  . Zoster 12/02/2008    Family History  Problem Relation Age of Onset  . Osteoporosis Mother   . Lung cancer Mother   . Stroke Mother   . Diabetes Father   . Hypertension Father   . Osteoporosis Father   . Colon cancer Father 41  . Breast cancer Sister        Age 65  . Ovarian cancer Maternal Aunt 60  . Breast cancer Cousin        maternal-Age 63  . Colon cancer Paternal Uncle 11  . Colon cancer Cousin        maternal first cousin  . Breast cancer Maternal Aunt        great aunt- Age unknown  . Crohn's disease Son      Current Outpatient Medications:  .  aspirin EC 81 MG tablet, Take 81 mg  by mouth daily., Disp: , Rfl:  .  Cholecalciferol (VITAMIN D) 2000 units CAPS, Take 2,000 Units by mouth daily., Disp: , Rfl:  .  clorazepate (TRANXENE) 7.5 MG tablet, Take 7.5 mg by mouth at bedtime as needed for anxiety., Disp: , Rfl:  .  cyanocobalamin 1000 MCG tablet, Take 1,000 mcg by mouth daily., Disp: , Rfl:  .  famotidine (PEPCID) 20 MG tablet, Take 1 tablet by mouth at bedtime MUST HAVE OFFICE VISIT FOR FURTHER REFILLS, Disp: 90 tablet, Rfl: 3 .  omeprazole (PRILOSEC) 40 MG capsule, Take 1  capsule (40 mg total) by mouth every morning. MUST HAVE OFFICE VISIT FOR FURTHER REFILLS, Disp: 90 capsule, Rfl: 3 .  Respiratory Therapy Supplies (FLUTTER) DEVI, 1 Units by Does not apply route as needed., Disp: 1 each, Rfl: 0      Objective:   Vitals:   10/04/18 0909  BP: 126/74  Pulse: 82  SpO2: 100%  Weight: 143 lb (64.9 kg)  Height: 5\' 4"  (1.626 m)    Estimated body mass index is 24.55 kg/m as calculated from the following:   Height as of this encounter: 5\' 4"  (1.626 m).   Weight as of this encounter: 143 lb (64.9 kg).  @WEIGHTCHANGE @  [employer]   10/04/18 0909  Weight: 143 lb (64.9 kg)     Physical Exam  General Appearance:    Alert, cooperative, no distress, appears stated age - yes , Deconditioned looking - no , OBESE  - no, Sitting on Wheelchair -  no  Head:    Normocephalic, without obvious abnormality, atraumatic  Eyes:    PERRL, conjunctiva/corneas clear,  Ears:    Normal TM's and external ear canals, both ears  Nose:   Nares normal, septum midline, mucosa normal, no drainage    or sinus tenderness. OXYGEN ON  - no . Patient is @ ra   Throat:   Lips, mucosa, and tongue normal; teeth and gums normal. Cyanosis on lips - no  Neck:   Supple, symmetrical, trachea midline, no adenopathy;    thyroid:  no enlargement/tenderness/nodules; no carotid   bruit or JVD  Back:     Symmetric, no curvature, ROM normal, no CVA tenderness  Lungs:     Distress - no , Wheeze no, Barrell Chest - no, Purse lip breathing - no, Crackles - no   Chest Wall:    No tenderness or deformity.    Heart:    Regular rate and rhythm, S1 and S2 normal, no rub   or gallop, Murmur - no  Breast Exam:    NOT DONE  Abdomen:     Soft, non-tender, bowel sounds active all four quadrants,    no masses, no organomegaly. Visceral obesity - no  Genitalia:   NOT DONE  Rectal:   NOT DONE  Extremities:   Extremities - normal, Has Cane - no, Clubbing - no, Edema - no  Pulses:   2+ and symmetric all  extremities  Skin:   Stigmata of Connective Tissue Disease - no  Lymph nodes:   Cervical, supraclavicular, and axillary nodes normal  Psychiatric:  Neurologic:   Pleasant - yes, Anxious - no, Flat affect - no  CAm-ICU - neg, Alert and Oriented x 3 - yes, Moves all 4s - yes, Speech - normal, Cognition - intact           Assessment:       ICD-10-CM   1. Bronchiectasis without complication ([place]) V77.9  Plan:     Patient Instructions     ICD-10-CM   1. Bronchiectasis without complication ([place]) A75.8     Stable and glad flutter valve with as needed mucinex helps  Plan - flutter valve continue - mucinex as needed - Please talk to PCP Colon Branch, MD -  and ensure you get  shingrix ([place]) inactivated vaccine against shingles  Followup 9 months or sooner if needed     SIGNATURE    Dr. Brand Males, M.D., F.C.C.P,  Pulmonary and Critical Care Medicine Staff Physician, [place] Director - Interstitial Lung Disease  Program  [facility] [place] [place], [place], [address]  Pager: [phone], If no answer or between  15:00h - 7:00h: call [phone] Telephone: [phone]  9:26 AM 10/04/2018

## 2018-12-13 NOTE — Progress Notes (Addendum)
Subjective:   Cynthia Thornton is a 73 y.o. female who presents for Medicare Annual (Subsequent) preventive examination.  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  Review of Systems: No ROS.  Medicare Wellness Visit. Additional risk factors are reflected in the social history. Cardiac Risk Factors include: advanced age (>77men, >5 women) Sleep patterns:  States she has always struggled to sleep more than 5 hrs. Home Safety/Smoke Alarms: Feels safe in home. Smoke alarms in place. Lives alone. No stairs.   Female:       Mammo- 09/17/18       Dexa scan- 03/18/18        CCS- done 11/02/14. 5 yr recall     Objective:     Vitals: BP 118/62 (BP Location: Left Arm, Patient Position: Sitting, Cuff Size: Normal)   Pulse 69   Ht 5\' 4"  (1.626 m)   Wt 144 lb 12.8 oz (65.7 kg)   SpO2 98%   BMI 24.85 kg/m   Body mass index is 24.85 kg/m.  Advanced Directives 12/16/2018 02/14/2018 12/14/2017 11/28/2017 11/02/2017 10/25/2017 10/03/2017  Does Patient Have a Medical Advance Directive? Yes Yes Yes Yes No No Yes  Type of Paramedic of [place];Living will [place];Living will [place];Living will [place];Living will - - Living will;Healthcare Power of Attorney  Does patient want to make changes to medical advance directive? No - Patient declined - - - - - No - Patient declined  Copy of [place] in Chart? No - copy requested - No - copy requested No - copy requested - - No - copy requested  Would patient like information on creating a medical advance directive? - - - - No - Patient declined Yes (MAU/Ambulatory/Procedural Areas - Information given) -    Tobacco Social History   Tobacco Use  Smoking Status Former Smoker  . Last attempt to quit: 10/31/1995  . Years since quitting: 23.1  Smokeless Tobacco Never Used     Counseling given: Not Answered   Clinical Intake: Pain :  No/denies pain   Past Medical History:  Diagnosis Date  . Arthritis   . Cancer ([place]) 09/27/2017   left breast genetic screening panel 2017 with variant of unknown significance AXINA  . DUB (dysfunctional uterine bleeding)   . Esophageal reflux 08/15/2006   EGD(Chronic)  . Esophageal stricture 08/15/2006   EGD  . Family history of breast cancer   . Family history of colon cancer   . Hiatal hernia 08/15/2006   EGD  . Hyperthyroidism 2010   no meds now  . Insomnia   . Osteopenia 02/2018   T score -2.4 distal third of radius.  FRAX 11% / 1.8% stable at other points of interest to include spine, right and left hip  . Personal history of colonic polyps 09/06/2001   hyperplastic  . Pityriasis rosea   . PONV (postoperative nausea and vomiting)   . Primary osteoarthritis of left knee    Severe Patellofemoral arthritis  . 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 BREAST LUMPECTOMY WITH RADIOACTIVE SEED AND SENTINEL LYMPH NODE BIOPSY Left 11/02/2017   Procedure: LEFT BREAST LUMPECTOMY WITH RADIOACTIVE SEED AND LEFT AXILLARY DEEP SENTINEL LYMPH NODE BIOPSY, INJECT BLUE DYE LEFT BREAST;  Surgeon: [name], MD;  Location: [place];  Service: General;  Laterality: Left;  . BREAST SURGERY  2000   Breast cyst removed, left  . TONSILLECTOMY    . VAGINAL HYSTERECTOMY  1991   Right ovarian cystectomy  . VIDEO BRONCHOSCOPY Bilateral 03/16/2015   Procedure: VIDEO BRONCHOSCOPY WITHOUT FLUORO;  Surgeon: Brand Males, MD;  Location: [facility]COPY;  Service: Endoscopy;  Laterality: Bilateral;   Family History  Problem Relation Age of Onset  . Osteoporosis Mother   . Lung cancer Mother   . Stroke Mother   . Diabetes Father   . Hypertension Father   . Osteoporosis Father   . Colon cancer Father 56  . Breast cancer Sister        Age 26  . Ovarian cancer Maternal Aunt 60  .  Breast cancer Cousin        maternal-Age 92  . Colon cancer Paternal Uncle 1  . Colon cancer Cousin        maternal first cousin  . Breast cancer Maternal Aunt        great aunt- Age unknown  . Crohn's disease Son    Social History   Socioeconomic History  . Marital status: Divorced    Spouse name: Not on file  . Number of children: 2  . Years of education: Not on file  . Highest education level: Not on file  Occupational History  . Occupation: Pharmacist, hospital, works part time     Fish farm manager: [employer]  . Financial resource strain: Not on file  . Food insecurity:    Worry: Not on file    Inability: Not on file  . Transportation needs:    Medical: Not on file    Non-medical: Not on file  Tobacco Use  . Smoking status: Former Smoker    Last attempt to quit: 10/31/1995    Years since quitting: 23.1  . Smokeless tobacco: Never Used  Substance and Sexual Activity  . Alcohol use: Yes    Alcohol/week: 0.0 standard drinks    Comment: occ  . Drug use: No  . Sexual activity: Not Currently    Birth control/protection: Surgical    Comment: 1st intercourse 73 yo-Fewer than 5 partners  Lifestyle  . Physical activity:    Days per week: Not on file    Minutes per session: Not on file  . Stress: Not on file  Relationships  . Social connections:    Talks on phone: Not on file    Gets together: Not on file    Attends religious service: Not on file    Active member of club or organization: Not on file    Attends meetings of clubs or organizations: Not on file    Relationship status: Not on file  Other Topics Concern  . Not on file  Social History Narrative   Lives by herself   2 children, one has mental issues     Outpatient Encounter Medications as of 12/16/2018  Medication Sig  . aspirin EC 81 MG tablet Take 81 mg by mouth daily.  . B Complex-C (B-COMPLEX WITH VITAMIN C) tablet Take 1 tablet by mouth daily.  . Cholecalciferol (VITAMIN D) 2000 units CAPS  Take 2,000 Units by mouth daily.  . famotidine (PEPCID) 20 MG tablet Take 1 tablet by mouth at bedtime MUST HAVE OFFICE VISIT FOR FURTHER REFILLS  . omeprazole (PRILOSEC) 40 MG capsule Take 1 capsule (40 mg total) by mouth every morning. MUST HAVE OFFICE VISIT FOR FURTHER REFILLS  . Respiratory Therapy  Supplies (FLUTTER) DEVI 1 Units by Does not apply route as needed.  . clorazepate (TRANXENE) 7.5 MG tablet Take 7.5 mg by mouth at bedtime as needed for anxiety.  . [DISCONTINUED] cyanocobalamin 1000 MCG tablet Take 1,000 mcg by mouth daily.   No facility-administered encounter medications on file as of 12/16/2018.     Activities of Daily Living In your present state of health, do you have any difficulty performing the following activities: 12/16/2018  Hearing? N  Vision? N  Difficulty concentrating or making decisions? N  Walking or climbing stairs? Y  Dressing or bathing? N  Doing errands, shopping? N  Preparing Food and eating ? N  Using the Toilet? N  In the past six months, have you accidently leaked urine? N  Do you have problems with loss of bowel control? N  Managing your Medications? N  Managing your Finances? N  Housekeeping or managing your Housekeeping? N  Some recent data might be hidden    Patient Care Team: Colon Branch, MD as PCP - Gaston Islam, MD as Consulting Physician (Orthopedic Surgery) Nicholas Lose, MD as Consulting Physician (Hematology and Oncology) [name], MD as Consulting Physician (General Surgery) Fontaine, Belinda Block, MD as Consulting Physician (Gynecology) Brand Males, MD as Consulting Physician (Pulmonary Disease) Kyung Rudd, MD as Consulting Physician (Radiation Oncology) Delice Bison Charlestine Massed, NP as Nurse Practitioner (Hematology and Oncology)    Assessment:   This is a routine wellness examination for [place]. Physical assessment deferred to PCP.  Exercise Activities and Dietary recommendations Current Exercise Habits:  The patient does not participate in regular exercise at present, Exercise limited by: None identified   Diet (meal preparation, eat out, water intake, caffeinated beverages, dairy products, fruits and vegetables): in general, a "healthy" diet  , well balanced   Goals    . Begin exercising (pt-stated)    . Continue working!       Fall Risk Fall Risk  12/16/2018 02/14/2018 12/14/2017 11/28/2017 12/08/2016  Falls in the past year? 0 No No No No     Depression Screen PHQ 2/9 Scores 12/16/2018 02/14/2018 12/14/2017 11/28/2017  PHQ - 2 Score 0 0 0 0     Cognitive Function Ad8 score reviewed for issues:  Issues making decisions:no  Less interest in hobbies / activities:no  Repeats questions, stories (family complaining):no  Trouble using ordinary gadgets (microwave, computer, phone):no  Forgets the month or year: no  Mismanaging finances: no  Remembering appts:no Daily problems with thinking and/or memory:no Ad8 score is=0   MMSE - Mini Mental State Exam 12/08/2016  Orientation to time 5  Orientation to Place 5  Registration 3  Attention/ Calculation 5  Recall 3  Language- name 2 objects 2  Language- repeat 1  Language- follow 3 step command 3  Language- read & follow direction 1  Write a sentence 1  Copy design 1  Total score 30        Immunization History  Administered Date(s) Administered  . Influenza Split 08/31/2011, 07/30/2014  . Influenza Whole 08/30/2009, 07/30/2010  . Influenza, High Dose Seasonal PF 07/05/2018  . Influenza,inj,Quad PF,6+ Mos 09/30/2012, 10/10/2013, 07/26/2015  . Influenza-Unspecified 08/18/2016  . Pneumococcal Conjugate-13 10/10/2013  . Pneumococcal Polysaccharide-23 07/27/2015  . Td 12/02/2008  . Zoster 12/02/2008    Screening Tests Health Maintenance  Topic Date Due  . TETANUS/TDAP  12/02/2018  . MAMMOGRAM  09/18/2019  . COLONOSCOPY  11/03/2019  . INFLUENZA VACCINE  Completed  . DEXA SCAN  Completed  .  Hepatitis C Screening   Completed  . PNA vac Low Risk Adult  Completed       Plan:    Please schedule your next medicare wellness visit with me in 1 yr.  Continue to eat heart healthy diet (full of fruits, vegetables, whole grains, lean protein, water--limit salt, fat, and sugar intake) and increase physical activity as tolerated.  Bring a copy of your living will and/or healthcare power of attorney to your next office visit.  I have personally reviewed and noted the following in the patient's chart:   . Medical and social history . Use of alcohol, tobacco or illicit drugs  . Current medications and supplements . Functional ability and status . Nutritional status . Physical activity . Advanced directives . List of other physicians . Hospitalizations, surgeries, and ER visits in previous 12 months . Vitals . Screenings to include cognitive, depression, and falls . Referrals and appointments  In addition, I have reviewed and discussed with patient certain preventive protocols, quality metrics, and best practice recommendations. A written personalized care plan for preventive services as well as general preventive health recommendations were provided to patient.     Naaman Plummer [place], [place]  12/16/2018  Kathlene November, MD

## 2018-12-16 ENCOUNTER — Ambulatory Visit (INDEPENDENT_AMBULATORY_CARE_PROVIDER_SITE_OTHER): Payer: Medicare Other | Admitting: Internal Medicine

## 2018-12-16 ENCOUNTER — Encounter: Payer: Self-pay | Admitting: *Deleted

## 2018-12-16 ENCOUNTER — Ambulatory Visit (INDEPENDENT_AMBULATORY_CARE_PROVIDER_SITE_OTHER): Payer: Medicare Other | Admitting: *Deleted

## 2018-12-16 ENCOUNTER — Encounter: Payer: Self-pay | Admitting: Internal Medicine

## 2018-12-16 VITALS — BP 118/62 | HR 69 | Ht 64.0 in | Wt 144.8 lb

## 2018-12-16 DIAGNOSIS — R221 Localized swelling, mass and lump, neck: Secondary | ICD-10-CM

## 2018-12-16 DIAGNOSIS — Z0000 Encounter for general adult medical examination without abnormal findings: Secondary | ICD-10-CM | POA: Diagnosis not present

## 2018-12-16 DIAGNOSIS — E875 Hyperkalemia: Secondary | ICD-10-CM

## 2018-12-16 DIAGNOSIS — E7800 Pure hypercholesterolemia, unspecified: Secondary | ICD-10-CM | POA: Diagnosis not present

## 2018-12-16 LAB — CBC WITH DIFFERENTIAL/PLATELET
Basophils Absolute: 0 10*3/uL (ref 0.0–0.1)
Basophils Relative: 0.6 % (ref 0.0–3.0)
EOS PCT: 2.4 % (ref 0.0–5.0)
Eosinophils Absolute: 0.2 10*3/uL (ref 0.0–0.7)
HCT: 37.6 % (ref 36.0–46.0)
HEMOGLOBIN: 12.8 g/dL (ref 12.0–15.0)
LYMPHS PCT: 22 % (ref 12.0–46.0)
Lymphs Abs: 1.4 10*3/uL (ref 0.7–4.0)
MCHC: 34.1 g/dL (ref 30.0–36.0)
MCV: 89.7 fl (ref 78.0–100.0)
MONO ABS: 0.6 10*3/uL (ref 0.1–1.0)
Monocytes Relative: 9.6 % (ref 3.0–12.0)
Neutro Abs: 4.1 10*3/uL (ref 1.4–7.7)
Neutrophils Relative %: 65.4 % (ref 43.0–77.0)
Platelets: 293 10*3/uL (ref 150.0–400.0)
RBC: 4.19 Mil/uL (ref 3.87–5.11)
RDW: 13.4 % (ref 11.5–15.5)
WBC: 6.3 10*3/uL (ref 4.0–10.5)

## 2018-12-16 LAB — COMPREHENSIVE METABOLIC PANEL
ALT: 13 U/L (ref 0–35)
AST: 14 U/L (ref 0–37)
Albumin: 4.5 g/dL (ref 3.5–5.2)
Alkaline Phosphatase: 90 U/L (ref 39–117)
BILIRUBIN TOTAL: 0.5 mg/dL (ref 0.2–1.2)
BUN: 18 mg/dL (ref 6–23)
CO2: 29 meq/L (ref 19–32)
CREATININE: 0.96 mg/dL (ref 0.40–1.20)
Calcium: 9.8 mg/dL (ref 8.4–10.5)
Chloride: 98 mEq/L (ref 96–112)
GFR: 57.34 mL/min — ABNORMAL LOW (ref >60.00)
GLUCOSE: 87 mg/dL (ref 70–99)
Potassium: 5.9 mEq/L — ABNORMAL HIGH (ref 3.5–5.1)
SODIUM: 134 meq/L — AB (ref 135–145)
TOTAL PROTEIN: 6.5 g/dL (ref 6.0–8.3)

## 2018-12-16 LAB — LIPID PANEL
CHOL/HDL RATIO: 2
Cholesterol: 200 mg/dL (ref 0–200)
HDL: 89.8 mg/dL (ref >39.00)
LDL Cholesterol: 90 mg/dL (ref 0–99)
NONHDL: 109.96
Triglycerides: 101 mg/dL (ref 0.0–149.0)
VLDL: 20.2 mg/dL (ref 0.0–40.0)

## 2018-12-16 MED ORDER — ZOSTER VAC RECOMB ADJUVANTED 50 MCG/0.5ML IM SUSR: 0.5000 mL | Freq: Once | INTRAMUSCULAR | 1 refills | Status: AC

## 2018-12-16 MED ORDER — PROMETHAZINE HCL 25 MG RE SUPP: 25.0000 mg | Freq: Two times a day (BID) | RECTAL | 1 refills | Status: DC | PRN

## 2018-12-16 NOTE — Progress Notes (Signed)
Pre visit review using our clinic review tool, if applicable. No additional management support is needed unless otherwise documented below in the visit note. 

## 2018-12-16 NOTE — Patient Instructions (Signed)
GO TO THE LAB : Get the blood work     GO TO THE FRONT DESK Schedule your next appointment   a checkup in 3 months  We will schedule ultrasound of your neck   Continue using Phenergan as needed, will cause drowsiness.  Call if not back to normal in few days

## 2018-12-16 NOTE — Patient Instructions (Signed)
Please schedule your next medicare wellness visit with me in 1 yr.  Continue to eat heart healthy diet (full of fruits, vegetables, whole grains, lean protein, water--limit salt, fat, and sugar intake) and increase physical activity as tolerated.  Bring a copy of your living will and/or healthcare power of attorney to your next office visit.   Cynthia Thornton , Thank you for taking time to come for your Medicare Wellness Visit. I appreciate your ongoing commitment to your health goals. Please review the following plan we discussed and let me know if I can assist you in the future.   These are the goals we discussed: Goals    . Begin exercising (pt-stated)    . Continue working!       This is a list of the screening recommended for you and due dates:  Health Maintenance  Topic Date Due  . Tetanus Vaccine  12/02/2018  . Mammogram  09/18/2019  . Colon Cancer Screening  11/03/2019  . Flu Shot  Completed  . DEXA scan (bone density measurement)  Completed  .  Hepatitis C: One time screening is recommended by Center for Disease Control  (CDC) for  adults born from [birth year] through 1963.   Completed  . Pneumonia vaccines  Completed   a Health Maintenance After Age 51 After age 41, you are at a higher risk for certain long-term diseases and infections as well as injuries from falls. Falls are a major cause of broken bones and head injuries in people who are older than age 97. Getting regular preventive care can help to keep you healthy and well. Preventive care includes getting regular testing and making lifestyle changes as recommended by your health care provider. Talk with your health care provider about:  Which screenings and tests you should have. A screening is a test that checks for a disease when you have no symptoms.  A diet and exercise plan that is right for you. What should I know about screenings and tests to prevent falls? Screening and testing are the best ways to find a health  problem early. Early diagnosis and treatment give you the best chance of managing medical conditions that are common after age 47. Certain conditions and lifestyle choices may make you more likely to have a fall. Your health care provider may recommend:  Regular vision checks. Poor vision and conditions such as cataracts can make you more likely to have a fall. If you wear glasses, make sure to get your prescription updated if your vision changes.  Medicine review. Work with your health care provider to regularly review all of the medicines you are taking, including over-the-counter medicines. Ask your health care provider about any side effects that may make you more likely to have a fall. Tell your health care provider if any medicines that you take make you feel dizzy or sleepy.  Osteoporosis screening. Osteoporosis is a condition that causes the bones to get weaker. This can make the bones weak and cause them to break more easily.  Blood pressure screening. Blood pressure changes and medicines to control blood pressure can make you feel dizzy.  Strength and balance checks. Your health care provider may recommend certain tests to check your strength and balance while standing, walking, or changing positions.  Foot health exam. Foot pain and numbness, as well as not wearing proper footwear, can make you more likely to have a fall.  Depression screening. You may be more likely to have a fall if you  have a fear of falling, feel emotionally low, or feel unable to do activities that you used to do.  Alcohol use screening. Using too much alcohol can affect your balance and may make you more likely to have a fall. What actions can I take to lower my risk of falls? General instructions  Talk with your health care provider about your risks for falling. Tell your health care provider if: ? You fall. Be sure to tell your health care provider about all falls, even ones that seem minor. ? You feel dizzy,  sleepy, or off-balance.  Take over-the-counter and prescription medicines only as told by your health care provider. These include any supplements.  Eat a healthy diet and maintain a healthy weight. A healthy diet includes low-fat dairy products, low-fat (lean) meats, and fiber from whole grains, beans, and lots of fruits and vegetables. Home safety  Remove any tripping hazards, such as rugs, cords, and clutter.  Install safety equipment such as grab bars in bathrooms and safety rails on stairs.  Keep rooms and walkways well-lit. Activity   Follow a regular exercise program to stay fit. This will help you maintain your balance. Ask your health care provider what types of exercise are appropriate for you.  If you need a cane or walker, use it as recommended by your health care provider.  Wear supportive shoes that have nonskid soles. Lifestyle  Do not drink alcohol if your health care provider tells you not to drink.  If you drink alcohol, limit how much you have: ? 0-1 drink a day for women. ? 0-2 drinks a day for men.  Be aware of how much alcohol is in your drink. In the [place] one drink equals one typical bottle of beer (12 oz), one-half glass of wine (5 oz), or one shot of hard liquor (1 oz).  Do not use any products that contain nicotine or tobacco, such as cigarettes and e-cigarettes. If you need help quitting, ask your health care provider. Summary  Having a healthy lifestyle and getting preventive care can help to protect your health and wellness after age 40.  Screening and testing are the best way to find a health problem early and help you avoid having a fall. Early diagnosis and treatment give you the best chance for managing medical conditions that are more common for people who are older than age 72.  Falls are a major cause of broken bones and head injuries in people who are older than age 63. Take precautions to prevent a fall at home.  Work with your health  care provider to learn what changes you can make to improve your health and wellness and to prevent falls. This information is not intended to replace advice given to you by your health care provider. Make sure you discuss any questions you have with your health care provider. Document Released: 08/29/2017 Document Revised: 08/29/2017 Document Reviewed: 08/29/2017 Elsevier Interactive Patient Education  2019 [employer].

## 2018-12-16 NOTE — Assessment & Plan Note (Addendum)
-  Td 2010; zostavax 2010, prevanr 2014; pnm shot 2 : 07-2015; had a flu shot; shingrix rx printed  -CCS: Last colonoscopy 08-2015, + polyps, 5 years.  Female care: Sees gynecology - breast ca dx 08-2017 -Diet is healthy; exercise discussed  -Labs reviewed: CMP, FLP, CBC

## 2018-12-16 NOTE — Progress Notes (Signed)
Subjective:    Patient ID: Cynthia Thornton, female    DOB: 12-May-1946, 73 y.o.   MRN: [id number]  DOS:  12/16/2018  Type of visit - description: CPX She has few concerns About 9 days ago she woke up with severe dizziness described as a spinning, associated with nausea and vomiting. Subsequently she saw cardiology for a routine checkup and mentioned her symptoms, a carotid ultrasound was essentially negative. Since then, symptoms are subsiding. They are triggered by head motion and they decrease by avoiding  head movements  Review of Systems General feeling well. She has mild cough at baseline. Specifically denies strokelike symptoms such as diplopia and facial numbness.  No chest pain or palpitations.  Other than above, a 14 point review of systems is negative   Past Medical History:  Diagnosis Date  . Arthritis   . Cancer ([place]) 09/27/2017   left breast genetic screening panel 2017 with variant of unknown significance AXINA  . DUB (dysfunctional uterine bleeding)   . Esophageal reflux 08/15/2006   EGD(Chronic)  . Esophageal stricture 08/15/2006   EGD  . Family history of breast cancer   . Family history of colon cancer   . Hiatal hernia 08/15/2006   EGD  . Hyperthyroidism 2010   no meds now  . Insomnia   . Osteopenia 02/2018   T score -2.4 distal third of radius.  FRAX 11% / 1.8% stable at other points of interest to include spine, right and left hip  . Personal history of colonic polyps 09/06/2001   hyperplastic  . Pityriasis rosea   . PONV (postoperative nausea and vomiting)   . Primary osteoarthritis of left knee    Severe Patellofemoral arthritis  . 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 BREAST LUMPECTOMY WITH RADIOACTIVE SEED AND SENTINEL LYMPH NODE BIOPSY Left 11/02/2017   Procedure: LEFT BREAST LUMPECTOMY WITH RADIOACTIVE SEED AND LEFT AXILLARY DEEP SENTINEL LYMPH  NODE BIOPSY, INJECT BLUE DYE LEFT BREAST;  Surgeon: [name], MD;  Location: [facility];  Service: General;  Laterality: Left;  . BREAST SURGERY  2000   Breast cyst removed, left  . TONSILLECTOMY    . VAGINAL HYSTERECTOMY  1991   Right ovarian cystectomy  . VIDEO BRONCHOSCOPY Bilateral 03/16/2015   Procedure: VIDEO BRONCHOSCOPY WITHOUT FLUORO;  Surgeon: Brand Males, MD;  Location: [facility] ENDOSCOPY;  Service: Endoscopy;  Laterality: Bilateral;    Social History   Socioeconomic History  . Marital status: Divorced    Spouse name: Not on file  . Number of children: 2  . Years of education: Not on file  . Highest education level: Not on file  Occupational History  . Occupation: Pharmacist, hospital, works part time     Fish farm manager: [employer] [place]  . Financial resource strain: Not on file  . Food insecurity:    Worry: Not on file    Inability: Not on file  . Transportation needs:    Medical: Not on file    Non-medical: Not on file  Tobacco Use  . Smoking status: Former Smoker    Last attempt to quit: 10/31/1995    Years since quitting: 23.1  . Smokeless tobacco: Never Used  Substance and Sexual Activity  . Alcohol use: Yes    Alcohol/week: 0.0 standard drinks    Comment: occ  . Drug use: No  . Sexual activity: Not  Currently    Birth control/protection: Surgical    Comment: 1st intercourse 73 yo-Fewer than 5 partners  Lifestyle  . Physical activity:    Days per week: Not on file    Minutes per session: Not on file  . Stress: Not on file  Relationships  . Social connections:    Talks on phone: Not on file    Gets together: Not on file    Attends religious service: Not on file    Active member of club or organization: Not on file    Attends meetings of clubs or organizations: Not on file    Relationship status: Not on file  . Intimate partner violence:    Fear of current or ex partner: Not on file    Emotionally abused: Not on file     Physically abused: Not on file    Forced sexual activity: Not on file  Other Topics Concern  . Not on file  Social History Narrative   Lives by herself   2 children, one has mental issues      Family History  Problem Relation Age of Onset  . Osteoporosis Mother   . Lung cancer Mother   . Stroke Mother   . Diabetes Father   . Hypertension Father   . Osteoporosis Father   . Colon cancer Father 58  . Breast cancer Sister        Age 37  . Ovarian cancer Maternal Aunt 60  . Breast cancer Cousin        maternal-Age 12  . Colon cancer Paternal Uncle 13  . Colon cancer Cousin        maternal first cousin  . Breast cancer Maternal Aunt        great aunt- Age unknown  . Crohn's disease Son      Allergies as of 12/16/2018      Reactions   Codeine Nausea Only   Propranolol Hcl Other (See Comments)   dizziness      Medication List       Accurate as of December 16, 2018 11:59 PM. Always use your most recent med list.        aspirin EC 81 MG tablet Take 81 mg by mouth daily.   B-complex with vitamin C tablet Take 1 tablet by mouth daily.   clorazepate 7.5 MG tablet Commonly known as:  TRANXENE Take 7.5 mg by mouth at bedtime as needed for anxiety.   famotidine 20 MG tablet Commonly known as:  PEPCID Take 1 tablet by mouth at bedtime MUST HAVE OFFICE VISIT FOR FURTHER REFILLS   FLUTTER Devi 1 Units by Does not apply route as needed.   omeprazole 40 MG capsule Commonly known as:  PRILOSEC Take 1 capsule (40 mg total) by mouth every morning. MUST HAVE OFFICE VISIT FOR FURTHER REFILLS   promethazine 25 MG suppository Commonly known as:  PHENERGAN Place 1 suppository (25 mg total) rectally every 12 (twelve) hours as needed for nausea or vomiting.   Vitamin D 50 MCG (2000 UT) Caps Take 2,000 Units by mouth daily.   Zoster Vaccine Adjuvanted injection Commonly known as:  SHINGRIX Inject 0.5 mLs into the muscle once for 1 dose.           Objective:    Physical Exam Neck:     BP 118/62 (BP Location: Left Arm, Patient Position: Sitting, Cuff Size: Small)   Pulse 69   Ht 5\' 4"  (1.626 m)   Wt 144 lb  12 oz (65.7 kg)   SpO2 98%   BMI 24.85 kg/m  General: Well developed, NAD, BMI noted Neck: No  thyromegaly but has a lump, see graphic. TMs normal HEENT:  Normocephalic . Face symmetric, atraumatic Lungs:  CTA B Normal respiratory effort, no intercostal retractions, no accessory muscle use. Heart: RRR,  no murmur.  No pretibial edema bilaterally  Abdomen:  Not distended, soft, non-tender. No rebound or rigidity.   Skin: Exposed areas without rash. Not pale. Not jaundice Neurologic:  alert & oriented X3.  Speech normal, gait appropriate for age and unassisted Strength symmetric and appropriate for age.  Psych: Cognition and judgment appear intact.  Cooperative with normal attention span and concentration.  Behavior appropriate. No anxious or depressed appearing.     Assessment     Assessment  Bronchiectasis Dr Chase Caller Anxiety - Insomnia : tranxene   At hs, rx by gyn, previously, I'll Rx if needed  Endocrinology Dr. Loanne Drilling q 3 years, Last visit 03/2015 --H/o thyrotoxicosis on remission --Goiter   Osteopenia --T score -1.5  (2015), dexa 02-2016 , dexa 03/19/2018 (at gyn) DJD -- [facility] GI: GERD, HH and esophageal stricture EGD 2007, colon polyps Breast cancer, DX 08-2017 History of many years: Symptoms recur 27s and 22s. H/o DUB  Plan Here for CPX Anxiety and insomnia: Rarely uses Tranxene, used to be prescribed by gynecology but I am willing to prescribe it moving forward Goiter: Seen by Dr. Loanne Drilling 05-2018. Lump, neck.  See physical exam, seems benign on clinical grounds, will get a anterior neck ultrasound.  Follow-up in 3 months. Osteopenia: Follow-up by gynecology Breast cancer: Status post lumpectomy and XRT.  Seems to be doing great Vertigo as described above, she has a history of Mnire's, that problem  has been silent for many years and current symptoms are not as severe.  Recent carotid ultrasound negative.  Neurological exam is nonfocal, rectal Phenergan helps, she is getting better.  Will Rx rectal Phenergan she will call me if not completely back to normal soon.  RTC 3 months

## 2018-12-17 NOTE — Assessment & Plan Note (Signed)
Here for CPX Anxiety and insomnia: Rarely uses Tranxene, used to be prescribed by gynecology but I am willing to prescribe it moving forward Goiter: Seen by Dr. Loanne Drilling 05-2018. Lump, neck.  See physical exam, seems benign on clinical grounds, will get a anterior neck ultrasound.  Follow-up in 3 months. Osteopenia: Follow-up by gynecology Breast cancer: Status post lumpectomy and XRT.  Seems to be doing great Vertigo as described above, she has a history of Mnire's, that problem has been silent for many years and current symptoms are not as severe.  Recent carotid ultrasound negative.  Neurological exam is nonfocal, rectal Phenergan helps, she is getting better.  Will Rx rectal Phenergan she will call me if not completely back to normal soon.  RTC 3 months

## 2018-12-18 ENCOUNTER — Ambulatory Visit (HOSPITAL_BASED_OUTPATIENT_CLINIC_OR_DEPARTMENT_OTHER)
Admission: RE | Admit: 2018-12-18 | Discharge: 2018-12-18 | Disposition: A | Discharge status: Home / Self Care | Payer: Medicare Other | Source: Ambulatory Visit | Attending: Internal Medicine | Admitting: Internal Medicine

## 2018-12-18 DIAGNOSIS — R221 Localized swelling, mass and lump, neck: Secondary | ICD-10-CM | POA: Insufficient documentation

## 2018-12-19 ENCOUNTER — Other Ambulatory Visit: Payer: Self-pay | Admitting: General Surgery

## 2018-12-19 DIAGNOSIS — N6320 Unspecified lump in the left breast, unspecified quadrant: Secondary | ICD-10-CM

## 2018-12-20 ENCOUNTER — Other Ambulatory Visit (INDEPENDENT_AMBULATORY_CARE_PROVIDER_SITE_OTHER): Payer: Medicare Other

## 2018-12-20 DIAGNOSIS — E875 Hyperkalemia: Secondary | ICD-10-CM | POA: Diagnosis not present

## 2018-12-20 LAB — BASIC METABOLIC PANEL
BUN: 18 mg/dL (ref 6–23)
CO2: 29 mEq/L (ref 19–32)
CREATININE: 0.93 mg/dL (ref 0.40–1.20)
Calcium: 9.1 mg/dL (ref 8.4–10.5)
Chloride: 101 mEq/L (ref 96–112)
GFR: 59.47 mL/min — ABNORMAL LOW (ref >60.00)
Glucose, Bld: 117 mg/dL — ABNORMAL HIGH (ref 70–99)
Potassium: 4.4 mEq/L (ref 3.5–5.1)
Sodium: 136 mEq/L (ref 135–145)

## 2018-12-20 NOTE — Addendum Note (Signed)
Addended by: Kelle Darting A on: 12/20/2018 02:02 PM   Modules accepted: Orders

## 2018-12-27 ENCOUNTER — Ambulatory Visit: Payer: Medicare Other | Admitting: Gynecology

## 2018-12-27 ENCOUNTER — Encounter: Payer: Self-pay | Admitting: Gynecology

## 2018-12-27 VITALS — BP 120/74 | Ht 64.0 in | Wt 144.0 lb

## 2018-12-27 DIAGNOSIS — N952 Postmenopausal atrophic vaginitis: Secondary | ICD-10-CM | POA: Diagnosis not present

## 2018-12-27 DIAGNOSIS — Z853 Personal history of malignant neoplasm of breast: Secondary | ICD-10-CM | POA: Diagnosis not present

## 2018-12-27 DIAGNOSIS — C9110 Chronic lymphocytic leukemia of B-cell type not having achieved remission: Secondary | ICD-10-CM

## 2018-12-27 DIAGNOSIS — Z01419 Encounter for gynecological examination (general) (routine) without abnormal findings: Secondary | ICD-10-CM | POA: Diagnosis not present

## 2018-12-27 DIAGNOSIS — N8111 Cystocele, midline: Secondary | ICD-10-CM | POA: Diagnosis not present

## 2018-12-27 DIAGNOSIS — M8580 Other specified disorders of bone density and structure, unspecified site: Secondary | ICD-10-CM

## 2018-12-27 DIAGNOSIS — Z8041 Family history of malignant neoplasm of ovary: Secondary | ICD-10-CM

## 2018-12-27 NOTE — Patient Instructions (Addendum)
Follow up for ultrasound as scheduled 

## 2018-12-27 NOTE — Progress Notes (Signed)
    Cynthia Thornton [birth year] [id number]        73 y.o.  [id number] for annual gynecologic exam.  Several issues noted below.  Past medical history,surgical history, problem list, medications, allergies, family history and social history were all reviewed and documented as reviewed in the EPIC chart.  ROS:  Performed with pertinent positives and negatives included in the history, assessment and plan.   Additional significant findings : None   Exam: [name] assistant Vitals:   12/27/18 0858  BP: 120/74  Weight: 144 lb (65.3 kg)  Height: 5\' 4"  (1.626 m)   Body mass index is 24.72 kg/m.  General appearance:  Normal affect, orientation and appearance. Skin: Grossly normal HEENT: Without gross lesions.  No cervical or supraclavicular adenopathy. Thyroid normal.  Lungs:  Clear without wheezing, rales or rhonchi Cardiac: RR, without RMG Abdominal:  Soft, nontender, without masses, guarding, rebound, organomegaly or hernia Breasts:  Examined lying and sitting.  Right without masses, retractions, discharge or axillary adenopathy.  Left with palpable nodule in axilla underlying lymph node sampling scar mobile firm.  No breast masses, retractions, discharge Pelvic:  Ext, BUS, Vagina: With atrophic changes.  First-degree high cystocele/possible enterocele.  Adnexa: Without masses or tenderness    Anus and perineum: Normal   Rectovaginal: Normal sphincter tone without palpated masses or tenderness.    Assessment/Plan:  73 y.o. [id number] female for annual gynecologic exam.  Status post TVH in the past  1. Nodule left axilla.  Saw Dr. [name] and has studies scheduled to look at this.  She has a history of left-sided breast cancer with last mammography 08/2018-.  She will continue to follow-up with him in reference to this evaluation. 2. Family history of breast cancer in sister, cousin and maternal aunt.  Also ovarian cancer in maternal aunt.  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  We again discussed her possible increased risk of ovarian cancer associated with genetic variants and options to proceed with laparoscopic BSO now versus ongoing ovarian surveillance.  She had ultrasound CA 125 last year which was negative.  At this point the patient prefers ongoing surveillance.  She understands the risks of missed diagnoses and is going to think more about whether she wants to proceed with BSO.  Ca1 25 ordered today.  Patient will follow-up for ultrasound.  Will rediscuss at that time. 3. Colonoscopy 2016.  Repeat at their recommended interval. 4. Pap smear 2012.  No Pap smear done today.  No history of significant abnormal Pap smears.  We both agree to stop screening per current screening guidelines based on hysterectomy and age. 5. Cystocele/possible enterocele.  Stable on serial exams.  Asymptomatic to the patient.  Continue to monitor. 6. Osteopenia.  DEXA 2019 T score -2.4 distal third of the radius.  FRAX 11% / 1.8%.  Stable at other sites measured.  Repeat DEXA next year at 2-year interval. 7. Health maintenance.  No routine lab work done as patient does this elsewhere.  Follow-up for CA 125 results and ultrasound.  Follow-up in 1 year for annual exam.   [name] MD, 9:29 AM 12/27/2018

## 2018-12-30 LAB — CA 125: CA 125: 5 U/mL (ref <35)

## 2019-01-10 ENCOUNTER — Ambulatory Visit: Payer: Medicare Other | Admitting: Endocrinology

## 2019-01-10 ENCOUNTER — Encounter: Payer: Self-pay | Admitting: Gynecology

## 2019-01-13 ENCOUNTER — Other Ambulatory Visit: Payer: Self-pay | Admitting: Radiology

## 2019-01-14 ENCOUNTER — Encounter: Payer: Self-pay | Admitting: Gynecology

## 2019-01-14 ENCOUNTER — Ambulatory Visit: Payer: Medicare Other | Admitting: Gynecology

## 2019-01-14 ENCOUNTER — Other Ambulatory Visit: Payer: Self-pay

## 2019-01-14 ENCOUNTER — Ambulatory Visit (INDEPENDENT_AMBULATORY_CARE_PROVIDER_SITE_OTHER): Payer: Medicare Other

## 2019-01-14 VITALS — BP 118/76

## 2019-01-14 DIAGNOSIS — Z8041 Family history of malignant neoplasm of ovary: Secondary | ICD-10-CM

## 2019-01-14 DIAGNOSIS — Z853 Personal history of malignant neoplasm of breast: Secondary | ICD-10-CM | POA: Diagnosis not present

## 2019-01-14 NOTE — Progress Notes (Signed)
    Cynthia Thornton 31-Jan-1946 [id number]        73 y.o.  [id number] presents for ultrasound and follow-up discussion.  Personal history of breast cancer and a family history of breast cancer in a sister, cousin and maternal aunt.  Also has ovarian cancer in a separate maternal aunt.  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  Recent CA 125 was 5  Past medical history,surgical history, problem list, medications, allergies, family history and social history were all reviewed and documented in the EPIC chart.  Directed ROS with pertinent positives and negatives documented in the history of present illness/assessment and plan.  Exam: Vitals:   01/14/19 1557  BP: 118/76   General appearance:  Normal  Ultrasound transvaginal status post hysterectomy shows normal vaginal cuff.  Normal-appearing right ovary.  Left ovary not visualized.  Left adnexa without visual pathology.  Cul-de-sac negative  Assessment/Plan:  73 y.o. [id number] with history as above.  The patient and I previously had discussed options for continuing surveillance or proceeding with attempted laparoscopic BSO.  The patient has thought about this and wants to proceed with laparoscopic BSO.  We discussed what is involved with the surgery to include the risks.  If unable to remove one or both ovaries due to scarring or abnormal anatomy but if everything else appears normal then do we proceed with an ex lap to remove the ovaries meaning a longer recovery on the larger incision or do we abort the procedure at that point and discuss follow-up surveillance or proceeding with more involved follow-up surgery.  Complications of laparoscopic surgery to include infection, incisional complications, inadvertent injury to internal organs including bowel bladder ureters vessels and nerves necessitating major exploratory reparative surgeries was also reviewed.  The patient wants to move towards scheduling the surgery.     [name] MD, 4:12 PM 01/14/2019

## 2019-01-14 NOTE — Patient Instructions (Signed)
Office will call you to arrange for the surgery

## 2019-01-15 ENCOUNTER — Telehealth: Payer: Self-pay | Admitting: *Deleted

## 2019-01-15 ENCOUNTER — Encounter: Payer: Self-pay | Admitting: Gynecology

## 2019-01-15 NOTE — Telephone Encounter (Signed)
Pt informed that there is a 4 week hold on scheduling non urgent surgeries due to the COVID-19 crisis. I advised her that I would call in 2 weeks to begin the scheduling process or to give an update on the scheduling of surgeries. The patient understood and knows to call us if she needs [place] before hearing from [place]. KW CMA

## 2019-01-21 ENCOUNTER — Encounter: Payer: Self-pay | Admitting: *Deleted

## 2019-01-21 NOTE — Progress Notes (Signed)
Received ultrasound guided biopsy results from [place], sent to be scanned into epic.

## 2019-01-24 ENCOUNTER — Ambulatory Visit: Payer: Medicare Other | Admitting: Endocrinology

## 2019-02-05 ENCOUNTER — Encounter: Payer: Self-pay | Admitting: *Deleted

## 2019-02-11 ENCOUNTER — Other Ambulatory Visit: Payer: Self-pay | Admitting: Internal Medicine

## 2019-03-10 ENCOUNTER — Telehealth: Payer: Self-pay

## 2019-03-10 NOTE — Telephone Encounter (Signed)
Per DPR access note on file I called and left message for  patient to let her know we are scheduling surgery again. I offered her 04/07/2019 and let her know I am holding time for her that day until I hear from her one way or the other. Please call me.

## 2019-03-11 NOTE — Telephone Encounter (Signed)
I spoke with patient and she has concerns about whether insurance will cover her surgery. She wanted me to check and make sure they will pay for it. I explained that they will not guarantee payment before the procedure.  She thought other offices had done this for her. I offered to call [facility] and ask. I talked with Moshe Salisbury at [facility] and was told that neither prior authorization or predetermination guarantee payment.  Payment is based on medical necessity at time claim is received.  I called patient back and per DPR access note of file I left her this detailed information. I asked her to call me when she can and let me know how she would like to proceed with June 8th.

## 2019-03-13 NOTE — Telephone Encounter (Signed)
Patient called back to let me know that she did not want June 8th and did not want to schedule right now. She will call me back when she is ready to schedule this surgery.

## 2019-03-28 ENCOUNTER — Other Ambulatory Visit: Payer: Self-pay

## 2019-03-28 ENCOUNTER — Ambulatory Visit (INDEPENDENT_AMBULATORY_CARE_PROVIDER_SITE_OTHER): Payer: Medicare Other | Admitting: Internal Medicine

## 2019-03-28 DIAGNOSIS — J479 Bronchiectasis, uncomplicated: Secondary | ICD-10-CM | POA: Diagnosis not present

## 2019-03-28 DIAGNOSIS — F419 Anxiety disorder, unspecified: Secondary | ICD-10-CM | POA: Diagnosis not present

## 2019-03-28 DIAGNOSIS — G4700 Insomnia, unspecified: Secondary | ICD-10-CM | POA: Diagnosis not present

## 2019-03-28 DIAGNOSIS — R221 Localized swelling, mass and lump, neck: Secondary | ICD-10-CM | POA: Diagnosis not present

## 2019-03-28 MED ORDER — CLORAZEPATE DIPOTASSIUM 7.5 MG PO TABS: 7.5000 mg | ORAL_TABLET | Freq: Every evening | ORAL | 2 refills | Status: DC | PRN

## 2019-03-28 NOTE — Progress Notes (Signed)
Subjective:    Patient ID: Cynthia Thornton, female    DOB: [birth year], 73 y.o.   MRN: [id number]  DOS:  03/28/2019 Type of visit - description: Virtual Visit via Video Note  I connected with@ on 03/28/19 at  9:40 AM EDT by a video enabled telemedicine application and verified that I am speaking with the correct person using two identifiers.   THIS ENCOUNTER IS A VIRTUAL VISIT DUE TO COVID-19 - PATIENT WAS NOT SEEN IN THE OFFICE. PATIENT HAS CONSENTED TO VIRTUAL VISIT / TELEMEDICINE VISIT   Location of patient: home  Location of provider: office  I discussed the limitations of evaluation and management by telemedicine and the availability of in person appointments. The patient expressed understanding and agreed to proceed.  History of Present Illness: Routine visit In general she is well. We review her medications and previous labs Her main concern is going back to work. 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  She expects will get enough PPE but she remains concerned   Review of Systems Denies fever chills No chest pain or difficulty breathing No cough  Past Medical History:  Diagnosis Date  . Arthritis   . Cancer ([place]) 09/27/2017   left breast genetic screening panel 2017 with variant of unknown significance AXINA  . Esophageal reflux 08/15/2006   EGD(Chronic)  . Esophageal stricture 08/15/2006   EGD  . Family history of breast cancer   . Family history of colon cancer   . Hiatal hernia 08/15/2006   EGD  . Hyperthyroidism 2010   no meds now  . Insomnia   . Osteopenia 02/2018   T score -2.4 distal third of radius.  FRAX 11% / 1.8% stable at other points of interest to include spine, right and left hip  . Personal history of colonic polyps 09/06/2001   hyperplastic  . Pityriasis rosea   . Primary osteoarthritis of left knee    Severe Patellofemoral arthritis  . Rhomboid pain    Right and right  trapezius with concomitant cervical spondylosis    Past Surgical History:  Procedure Laterality Date  . BREAST LUMPECTOMY WITH RADIOACTIVE SEED AND SENTINEL LYMPH NODE BIOPSY Left 11/02/2017   Procedure: LEFT BREAST LUMPECTOMY WITH RADIOACTIVE SEED AND LEFT AXILLARY DEEP SENTINEL LYMPH NODE BIOPSY, INJECT BLUE DYE LEFT BREAST;  Surgeon: [name], MD;  Location: [facility];  Service: General;  Laterality: Left;  . BREAST SURGERY  2000   Breast cyst removed, left  . TONSILLECTOMY    . VAGINAL HYSTERECTOMY  1991   Right ovarian cystectomy  . VIDEO BRONCHOSCOPY Bilateral 03/16/2015   Procedure: VIDEO BRONCHOSCOPY WITHOUT FLUORO;  Surgeon: Brand Males, MD;  Location: [facility] ENDOSCOPY;  Service: Endoscopy;  Laterality: Bilateral;    Social History   Socioeconomic History  . Marital status: Divorced    Spouse name: Not on file  . Number of children: 2  . Years of education: Not on file  . Highest education level: Not on file  Occupational History  . 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 Financial resource strain: Not on file  . Food insecurity:    Worry: Not on file    Inability: Not on file  . Transportation needs:    Medical: Not on file    Non-medical: Not on file  Tobacco Use  .  Smoking status: Former Smoker    Last attempt to quit: 10/31/1995    Years since quitting: 23.4  . Smokeless tobacco: Never Used  Substance and Sexual Activity  . Alcohol use: Yes    Alcohol/week: 0.0 standard drinks    Comment: occ  . Drug use: No  . Sexual activity: Not Currently    Birth control/protection: Surgical    Comment: 1st intercourse 73 yo-Fewer than 5 partners  Lifestyle  . Physical activity:    Days per week: Not on file    Minutes per session: Not on file  . Stress: Not on file  Relationships  . Social connections:    Talks on phone: Not on file    Gets together: Not on file    Attends religious service:  Not on file    Active member of club or organization: Not on file    Attends meetings of clubs or organizations: Not on file    Relationship status: Not on file  . Intimate partner violence:    Fear of current or ex partner: Not on file    Emotionally abused: Not on file    Physically abused: Not on file    Forced sexual activity: Not on file  Other Topics Concern  . Not on file  Social History Narrative   Lives by herself   2 children, one has mental issues       Allergies as of 03/28/2019      Reactions   Codeine Nausea Only   Propranolol Hcl Other (See Comments)   dizziness      Medication List       Accurate as of Mar 28, 2019  9:39 AM. If you have any questions, ask your nurse or doctor.        aspirin EC 81 MG tablet Take 81 mg by mouth daily.   B-complex with vitamin C tablet Take 1 tablet by mouth daily.   clorazepate 7.5 MG tablet Commonly known as:  TRANXENE Take 7.5 mg by mouth at bedtime as needed for anxiety.   famotidine 20 MG tablet Commonly known as:  PEPCID TAKE 1 TABLET BY MOUTH AT BEDTIME. NEEDS VISIT FOR FURTHER REFILLS   Flutter Devi 1 Units by Does not apply route as needed.   omeprazole 40 MG capsule Commonly known as:  PRILOSEC TAKE 1 CAPSULE BY MOUTH EVERY MORNING   promethazine 25 MG suppository Commonly known as:  Phenergan Place 1 suppository (25 mg total) rectally every 12 (twelve) hours as needed for nausea or vomiting.   Vitamin D 50 MCG (2000 UT) Caps Take 2,000 Units by mouth daily.           Objective:   Physical Exam There were no vitals taken for this visit. This is a virtual video visit.  Alert oriented x3, no apparent distress    Assessment     Assessment  Bronchiectasis Dr Chase Caller Anxiety - Insomnia : tranxene   At hs, rx by gyn, previously, I'll Rx if needed  Endocrinology Dr. Loanne Drilling q 3 years, Last visit 03/2015 --H/o thyrotoxicosis on remission --Goiter   Osteopenia --T score -1.5  (2015), dexa  02-2016 , dexa 03/19/2018 (at gyn) DJD -- [facility] GI: GERD, HH and esophageal stricture EGD 2007, colon polyps Breast cancer, DX 08-2017, s/p XRT History of many years: Symptoms recur 66s and 31s. H/o DUB  Plan Anxiety and insomnia: Well-controlled needs RF on Tranxene. COVID-19: The patient is concerned about going back to work, see  HPI.  She is 61 and has a history of bronchiectasis thus is @  high risk.  Recommend to work on line only.  If needed, I am willing to write a letter for her. Bronchiectasis: See above Lump, neck: See last visit, today she reports self-exam is negative.  Ultrasound was negative. Family history of ovarian cancer: Gynecology is planning to do a oophorectomy  RTC 11-2018 CPX    I discussed the assessment and treatment plan with the patient. The patient was provided an opportunity to ask questions and all were answered. The patient agreed with the plan and demonstrated an understanding of the instructions.   The patient was advised to call back or seek an in-person evaluation if the symptoms worsen or if the condition fails to improve as anticipated.

## 2019-03-29 DIAGNOSIS — G4700 Insomnia, unspecified: Secondary | ICD-10-CM | POA: Insufficient documentation

## 2019-03-29 DIAGNOSIS — F419 Anxiety disorder, unspecified: Secondary | ICD-10-CM | POA: Insufficient documentation

## 2019-03-29 HISTORY — DX: Anxiety disorder, unspecified: F41.9

## 2019-03-29 NOTE — Assessment & Plan Note (Signed)
Anxiety and insomnia: Well-controlled needs RF on Tranxene. COVID-19: The patient is concerned about going back to work, see HPI.  She is 5 and has a history of bronchiectasis thus is @  high risk.  Recommend to work on line only.  If needed, I am willing to write a letter for her. Bronchiectasis: See above Lump, neck: See last visit, today she reports self-exam is negative.  Ultrasound was negative. Family history of ovarian cancer: Gynecology is planning to do a oophorectomy  RTC 11-2018 CPX

## 2019-04-11 ENCOUNTER — Other Ambulatory Visit: Payer: Self-pay

## 2019-04-11 ENCOUNTER — Ambulatory Visit: Payer: Medicare Other | Admitting: Endocrinology

## 2019-04-11 ENCOUNTER — Encounter: Payer: Self-pay | Admitting: Endocrinology

## 2019-04-11 VITALS — BP 100/60 | HR 70 | Temp 98.1°F | Wt 144.4 lb

## 2019-04-11 DIAGNOSIS — E042 Nontoxic multinodular goiter: Secondary | ICD-10-CM

## 2019-04-11 LAB — TSH: TSH: 3.02 u[IU]/mL (ref 0.35–4.50)

## 2019-04-11 LAB — T4, FREE: Free T4: 1.07 ng/dL (ref 0.60–1.60)

## 2019-04-11 NOTE — Progress Notes (Signed)
Subjective:    Patient ID: Cynthia Thornton, female    DOB: Dec 31, 1945, 73 y.o.   MRN: [id number]  HPI pt returns for f/u of hyperthyroidism (dx'ed 2009; [place] in 2020 showed multiple small nodules (not big enough to need bx or f/u), were unchanged; she took thionamide rx for a few months in 2010 for suppressed TSH, but she went off due to normal TFT).  she does not notice the goiter.  pt states she feels well in general.   Past Medical History:  Diagnosis Date  . Arthritis   . Cancer ([place]) 09/27/2017   left breast genetic screening panel 2017 with variant of unknown significance AXINA  . Esophageal reflux 08/15/2006   EGD(Chronic)  . Esophageal stricture 08/15/2006   EGD  . Family history of breast cancer   . Family history of colon cancer   . Hiatal hernia 08/15/2006   EGD  . Hyperthyroidism 2010   no meds now  . Insomnia   . Osteopenia 02/2018   T score -2.4 distal third of radius.  FRAX 11% / 1.8% stable at other points of interest to include spine, right and left hip  . Personal history of colonic polyps 09/06/2001   hyperplastic  . Pityriasis rosea   . Primary osteoarthritis of left knee    Severe Patellofemoral arthritis  . 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 BREAST LUMPECTOMY WITH RADIOACTIVE SEED AND SENTINEL LYMPH NODE BIOPSY Left 11/02/2017   Procedure: LEFT BREAST LUMPECTOMY WITH RADIOACTIVE SEED AND LEFT AXILLARY DEEP SENTINEL LYMPH NODE BIOPSY, INJECT BLUE DYE LEFT BREAST;  Surgeon: [name], MD;  Location: [facility];  Service: General;  Laterality: Left;  . BREAST SURGERY  2000   Breast cyst removed, left  . TONSILLECTOMY    . VAGINAL HYSTERECTOMY  1991   Right ovarian cystectomy  . VIDEO BRONCHOSCOPY Bilateral 03/16/2015   Procedure: VIDEO BRONCHOSCOPY WITHOUT FLUORO;  Surgeon: Brand Males, MD;  Location: [facility];  Service:  Endoscopy;  Laterality: Bilateral;    Social History   Socioeconomic History  . Marital status: Divorced    Spouse name: Not on file  . Number of children: 2  . Years of education: Not on file  . Highest education level: Not on file  Occupational History  . Occupation: Pharmacist, hospital, works part time     Fish farm manager: [employer]  . Financial resource strain: Not on file  . Food insecurity    Worry: Not on file    Inability: Not on file  . Transportation needs    Medical: Not on file    Non-medical: Not on file  Tobacco Use  . Smoking status: Former Smoker    Quit date: 10/31/1995    Years since quitting: 23.4  . Smokeless tobacco: Never Used  Substance and Sexual Activity  . Alcohol use: Yes    Alcohol/week: 0.0 standard drinks    Comment: occ  . Drug use: No  . Sexual activity: Not Currently    Birth control/protection: Surgical    Comment: 1st intercourse 73 yo-Fewer than 5 partners  Lifestyle  . Physical activity    Days per week: Not on file    Minutes per session: Not on file  . Stress: Not on file  Relationships  . Social connections    Talks on phone: Not on file  Gets together: Not on file    Attends religious service: Not on file    Active member of club or organization: Not on file    Attends meetings of clubs or organizations: Not on file    Relationship status: Not on file  . Intimate partner violence    Fear of current or ex partner: Not on file    Emotionally abused: Not on file    Physically abused: Not on file    Forced sexual activity: Not on file  Other Topics Concern  . Not on file  Social History Narrative   Lives by herself   2 children, one has mental issues     Current Outpatient Medications on File Prior to Visit  Medication Sig Dispense Refill  . aspirin EC 81 MG tablet Take 81 mg by mouth daily.    . B Complex-C (B-COMPLEX WITH VITAMIN C) tablet Take 1 tablet by mouth daily.    . Cholecalciferol (VITAMIN D) 2000  units CAPS Take 2,000 Units by mouth daily.    . clorazepate (TRANXENE) 7.5 MG tablet Take 1 tablet (7.5 mg total) by mouth at bedtime as needed for anxiety. 30 tablet 2  . famotidine (PEPCID) 20 MG tablet TAKE 1 TABLET BY MOUTH AT BEDTIME. NEEDS VISIT FOR FURTHER REFILLS 90 tablet 0  . omeprazole (PRILOSEC) 40 MG capsule TAKE 1 CAPSULE BY MOUTH EVERY MORNING 90 capsule 0  . promethazine (PHENERGAN) 25 MG suppository Place 1 suppository (25 mg total) rectally every 12 (twelve) hours as needed for nausea or vomiting. 30 each 1  . Respiratory Therapy Supplies (FLUTTER) DEVI 1 Units by Does not apply route as needed. 1 each 0   No current facility-administered medications on file prior to visit.     Allergies  Allergen Reactions  . Codeine Nausea Only  . Propranolol Hcl Other (See Comments)    dizziness    Family History  Problem Relation Age of Onset  . Osteoporosis Mother   . Lung cancer Mother   . Stroke Mother   . Diabetes Father   . Hypertension Father   . Osteoporosis Father   . Colon cancer Father 17  . Breast cancer Sister        Age 54  . Ovarian cancer Maternal Aunt 60  . Breast cancer Cousin        maternal-Age 14  . Colon cancer Paternal Uncle 91  . Colon cancer Cousin        maternal first cousin  . Breast cancer Maternal Aunt        great aunt- Age unknown  . Crohn's disease Son     BP 100/60 (BP Location: Right Arm, Patient Position: Sitting, Cuff Size: Normal)   Pulse 70   Temp 98.1 F (36.7 C) (Oral)   Wt 144 lb 6.4 oz (65.5 kg)   SpO2 98%   BMI 24.79 kg/m   Review of Systems Denies neck pain.      Objective:   Physical Exam VITAL SIGNS:  See vs page GENERAL: no distress NECK: thyroid is slightly and diffusely enlarged.  No palpable nodule.        Assessment & Plan:  MNG: all she needs for this is annual PE of the neck Hyperthyroidism, by hx.  I told pt she is at risk for recurrence  Patient Instructions  Blood tests are requested for you  today.  We'll let you know about the results.  Please come back for a follow-up appointment in  1 year.  most of the time, a "lumpy thyroid" will eventually become overactive again.  this may take many years.

## 2019-04-11 NOTE — Patient Instructions (Signed)
Blood tests are requested for you today.  We'll let you know about the results.  Please come back for a follow-up appointment in 1 year.  most of the time, a "lumpy thyroid" will eventually become overactive again.  this may take many years.    

## 2019-05-05 ENCOUNTER — Encounter: Payer: Self-pay | Admitting: *Deleted

## 2019-05-07 ENCOUNTER — Other Ambulatory Visit: Payer: Self-pay | Admitting: Internal Medicine

## 2019-05-07 ENCOUNTER — Other Ambulatory Visit: Payer: Self-pay

## 2019-05-07 ENCOUNTER — Ambulatory Visit (INDEPENDENT_AMBULATORY_CARE_PROVIDER_SITE_OTHER): Payer: Medicare Other | Admitting: Internal Medicine

## 2019-05-07 DIAGNOSIS — Z79899 Other long term (current) drug therapy: Secondary | ICD-10-CM | POA: Diagnosis not present

## 2019-05-07 DIAGNOSIS — Z86010 Personal history of colonic polyps: Secondary | ICD-10-CM

## 2019-05-07 DIAGNOSIS — K219 Gastro-esophageal reflux disease without esophagitis: Secondary | ICD-10-CM

## 2019-05-07 NOTE — Progress Notes (Signed)
Subjective:    Patient ID: Cynthia Thornton, female    DOB: 1945/12/27, 73 y.o.   MRN: [id number]  This service was provided via telemedicine.   Doximity app with A/V communication The patient was located at home The provider was located in provider's GI office. The patient did consent to this telephone visit and is aware of possible charges through their insurance for this visit.   The persons participating in this telemedicine service were the patient and I. Time spent on call: 19 minutes   HPI Cynthia Thornton is a 73 year old female with a history of GERD, hiatal hernia, family history of colon cancer, personal history of sessile serrated polyp, history of breast cancer who seen for follow-up.  She is seen virtually today in the setting of COVID-19.  She was last seen in the office on 01/25/2018.  She reports that she has been doing well.  She would like to attempt to get off PPI therapy.  Her sister made her nervous that this medication may cause dementia.  Her reflux is been well controlled.  She is not having any breakthrough symptoms.  She has been using omeprazole 40 mg in the morning and famotidine 20 mg in the evening.  No dysphagia or odynophagia.  No abdominal pain.  Bowel movements have been regular occurring 1-2 times per day.  No blood in her stool or melena.  She does eat a plant-based diet mostly vegan since her breast cancer diagnosis.  She will rarely eat meat and dairy products.  She reports a recent bone density study showed stable osteopenia.  She has not been recommended to use bisphosphonate.  Review of Systems As per HPI, otherwise negative    Objective:   Physical Exam No physical exam, virtual visit  CBC    Component Value Date/Time   WBC 6.3 12/16/2018 1038   RBC 4.19 12/16/2018 1038   HGB 12.8 12/16/2018 1038   HGB 13.0 10/03/2017 0837   HCT 37.6 12/16/2018 1038   HCT 37.6 10/03/2017 0837   PLT 293.0 12/16/2018 1038   PLT 305 10/03/2017 0837   MCV 89.7  12/16/2018 1038   MCV 88.3 10/03/2017 0837   MCH 30.5 10/03/2017 0837   MCH 27.8 12/08/2016 1537   MCHC 34.1 12/16/2018 1038   RDW 13.4 12/16/2018 1038   RDW 13.4 10/03/2017 0837   LYMPHSABS 1.4 12/16/2018 1038   LYMPHSABS 1.5 10/03/2017 0837   MONOABS 0.6 12/16/2018 1038   MONOABS 0.5 10/03/2017 0837   EOSABS 0.2 12/16/2018 1038   EOSABS 0.2 10/03/2017 0837   BASOSABS 0.0 12/16/2018 1038   BASOSABS 0.1 10/03/2017 0837   CMP     Component Value Date/Time   NA 136 12/20/2018 1405   NA 134 (L) 10/03/2017 0837   K 4.4 12/20/2018 1405   K 4.3 10/03/2017 0837   CL 101 12/20/2018 1405   CO2 29 12/20/2018 1405   CO2 23 10/03/2017 0837   GLUCOSE 117 (H) 12/20/2018 1405   GLUCOSE 90 10/03/2017 0837   BUN 18 12/20/2018 1405   BUN 24.3 10/03/2017 0837   CREATININE 0.93 12/20/2018 1405   CREATININE 1.0 10/03/2017 0837   CALCIUM 9.1 12/20/2018 1405   CALCIUM 9.7 10/03/2017 0837   PROT 6.5 12/16/2018 1038   PROT 7.7 10/03/2017 0837   ALBUMIN 4.5 12/16/2018 1038   ALBUMIN 4.7 10/03/2017 0837   AST 14 12/16/2018 1038   AST 21 10/03/2017 0837   ALT 13 12/16/2018 1038   ALT 17 10/03/2017  0837   ALKPHOS 90 12/16/2018 1038   ALKPHOS 107 10/03/2017 0837   BILITOT 0.5 12/16/2018 1038   BILITOT 0.44 10/03/2017 0837   GFRNONAA 46 (L) 03/04/2016 0251   GFRAA 54 (L) 03/04/2016 0251         Assessment & Plan:  73 year old female with a history of GERD, hiatal hernia, family history of colon cancer, personal history of sessile serrated polyp, history of breast cancer who seen for follow-up.   1.  GERD --symptoms well controlled.  We discussed chronic PPI therapy at length today.  We discussed how the data does not support a correlation or link between PPI use and dementia.  Despite this these medications do have effect on bone, vitamin and mineral metabolism.  She would like to try to get off omeprazole which I support.  We will discontinue omeprazole and try famotidine 20 mg twice daily.   She is advised to let me know if reflux symptoms recur on reducing/changing medication.  With her primarily vegan diet and normal weight hopefully her reflux will not return. --Discontinue omeprazole --Begin famotidine 20 mg in the morning and continue 20 mg in the evening  2.  Personal history of sessile serrated polyp and family history of colon cancer --she worries about her colon health in the light of her family history and her personal history of breast cancer.  She also worries because the sessile serrated polyp which was found over 4 years ago.  She would prefer to proceed with surveillance soon rather than wait the additional 3 months.  I think this is reasonable.  We discussed the risk, benefits and alternatives and she is agreeable and wishes to proceed --Colonoscopy in the [facility]  Annual follow-up in the office, sooner if needed

## 2019-05-08 MED ORDER — SUPREP BOWEL PREP KIT 17.5-3.13-1.6 GM/177ML PO SOLN: 1.0000 | ORAL | 0 refills | Status: DC

## 2019-05-08 MED ORDER — FAMOTIDINE 20 MG PO TABS: 20.0000 mg | ORAL_TABLET | Freq: Two times a day (BID) | ORAL | 1 refills | Status: DC

## 2019-05-08 NOTE — Addendum Note (Signed)
Addended by: Larina Bras on: 05/08/2019 09:47 AM   Modules accepted: Orders

## 2019-05-08 NOTE — Patient Instructions (Addendum)
Discontinue omeprazole  We have sent the following medications to your pharmacy for you to pick up at your convenience: famotidine 20 mg in the morning and continue 20 mg in the evening  You have been scheduled for a colonoscopy. Please follow written instructions given to you at your visit today.  Please pick up your prep supplies at the pharmacy within the next 1-3 days. If you use inhalers (even only as needed), please bring them with you on the day of your procedure. Your physician has requested that you go to www.startemmi.com and enter the access code given to you at your visit today. This web site gives a general overview about your procedure. However, you should still follow specific instructions given to you by our office regarding your preparation for the procedure.  Please follow up with Dr Hilarie Fredrickson in the office in 1 year.  If you are age 60 or older, your body mass index should be between 23-30. Your There is no height or weight on file to calculate BMI. If this is out of the aforementioned range listed, please consider follow up with your Primary Care Provider.  If you are age 34 or younger, your body mass index should be between 19-25. Your There is no height or weight on file to calculate BMI. If this is out of the aformentioned range listed, please consider follow up with your Primary Care Provider.

## 2019-05-15 ENCOUNTER — Encounter: Payer: Self-pay | Admitting: Nurse Practitioner

## 2019-05-15 ENCOUNTER — Telehealth: Payer: Self-pay | Admitting: Internal Medicine

## 2019-05-15 ENCOUNTER — Telehealth: Payer: Self-pay

## 2019-05-15 ENCOUNTER — Ambulatory Visit (INDEPENDENT_AMBULATORY_CARE_PROVIDER_SITE_OTHER): Payer: Medicare Other | Admitting: Nurse Practitioner

## 2019-05-15 ENCOUNTER — Other Ambulatory Visit: Payer: Self-pay

## 2019-05-15 DIAGNOSIS — J471 Bronchiectasis with (acute) exacerbation: Secondary | ICD-10-CM | POA: Diagnosis not present

## 2019-05-15 MED ORDER — DOXYCYCLINE HYCLATE 100 MG PO TABS: 100.0000 mg | ORAL_TABLET | Freq: Two times a day (BID) | ORAL | 0 refills | Status: DC

## 2019-05-15 NOTE — Telephone Encounter (Signed)
Pt's Rx doxycycline was sent to pt's pharmacy listed. Attempted to call pt to let her know this had been done but unable to reach. Left pt a detailed message letting her know that abx was sent to preferred pharmacy after televisit. Nothing further needed.

## 2019-05-15 NOTE — Telephone Encounter (Signed)
MR pt being treated for Bronchiectasis, last seen 10/04/2018. C/o worsening clear/tan mucus production in chestX2 wks, but significantly worsened X3 days. Mucus is worse as day progresses, when laying down at night.  Not as thick/as much in monrings.  Denies fever, muscle aches, sore throat, fever, chills, nausea, vomiting, active cough, hemoptysis.     Pt has been taking Zyrtec, Robitussin DM, increased fluid intake to help with s/s.  Requesting additional recs.   Pharmacy: [facility] on [address].   Sending to [facility] as MR is not in office.  TN please advise on recs.  Thanks!

## 2019-05-15 NOTE — Telephone Encounter (Signed)
I called patient to let her know that I talked with two different representatives on two different days from her insurance company and they will not answer the question "is there anything we can do to guarantee payment of claim?".  I told patient all the things I was told about the CPT code being a "diagnosis driven code" and all her dx codes were acceptable. No Prior Auth required. Pays provider 100% of claim.  The last rep I talked to agreed when I said it sounded like we just send in claim and wait for determination to be made.  Patient understood no guarantee of payment. She thinks she would like to schedule on a W[place] in Sept and I will call her when I get the Sept schedule.

## 2019-05-15 NOTE — Telephone Encounter (Signed)
Spoke with pt, 10:00 TeleVisit scheduled with Tonya at [facility] request.  Nothing further needed at this time- will close encounter.

## 2019-05-15 NOTE — Assessment & Plan Note (Addendum)
Patient has a tele-visit today for acute visit.  She states that she has been having increased mucus production for 2 weeks that has progressively worsened over the last 3 days.  She states that her cough is productive of tan sputum.  She states that mucus is worse as the day progresses and at night.  Denies any recent fever.  Patient reports that she has been taking Zyrtec, Robitussin-DM, and increasing fluids.  Patient is compliant with flutter valve.  Patient Instructions  Plan -will order doxycycline - flutter valve continue - mucinex twice daily as needed  Followup Follow up with Dr. Chase Caller in 2 weeks or sooner if needed

## 2019-05-15 NOTE — Progress Notes (Signed)
Virtual Visit via Telephone Note  I connected with Cynthia Thornton on 05/15/19 at 10:00 AM EDT by telephone and verified that I am speaking with the correct person using two identifiers.  Location: Patient: home Provider: office   I discussed the limitations, risks, security and privacy concerns of performing an evaluation and management service by telephone and the availability of in person appointments. I also discussed with the patient that there may be a patient responsible charge related to this service. The patient expressed understanding and agreed to proceed.   History of Present Illness: 73 year old female former smoker with bronchiectasis who is followed by Dr. Chase Caller  Patient has a tele-visit today for acute visit.  She states that she has been having increased mucus production for 2 weeks that has progressively worsened over the last 3 days.  She states that her cough is productive of tan sputum.  She states that mucus is worse as the day progresses and at night.  Denies any recent fever.  Patient reports that she has been taking Zyrtec, Robitussin-DM, and increasing fluids.  Patient is compliant with flutter valve. Denies f/c/s, n/v/d, hemoptysis, PND, leg swelling.     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 No evidence of acute cardiopulmonary disease.   PFT: PFT Results Latest Ref Rng & Units 10/20/2013  FVC-Pre L 3.34  FVC-Predicted Pre % 106  FVC-Post L 3.49  FVC-Predicted Post % 111  Pre FEV1/FVC % % 67  Post FEV1/FCV % % 75  FEV1-Pre L 2.26  FEV1-Predicted Pre % 94  FEV1-Post L 2.62     Assessment and Plan: Patient has a tele-visit today for acute visit.  She states that she has been having increased mucus production for 2 weeks that has progressively worsened over the last 3 days.  She states that her cough is  productive of tan sputum.  She states that mucus is worse as the day progresses and at night.  Denies any recent fever.  Patient reports that she has been taking Zyrtec, Robitussin-DM, and increasing fluids.  Patient is compliant with flutter valve.  Patient Instructions  Plan -will order doxycycline - flutter valve continue - mucinex twice daily as needed   Follow Up Instructions: Follow up with Dr. Chase Caller in 2 weeks or sooner if needed    I discussed the assessment and treatment plan with the patient. The patient was provided an opportunity to ask questions and all were answered. The patient agreed with the plan and demonstrated an understanding of the instructions.   The patient was advised to call back or seek an in-person evaluation if the symptoms worsen or if the condition fails to improve as anticipated.  I provided 22 minutes of non-face-to-face time during this encounter.   Fenton Foy, NP

## 2019-05-15 NOTE — Telephone Encounter (Signed)
Please schedule e- visit with me today. Thanks.

## 2019-05-15 NOTE — Patient Instructions (Addendum)
Plan -will order doxycycline - flutter valve continue - mucinex twice daily as needed  Followup Follow up with Dr. Chase Caller in 2 weeks or sooner if needed

## 2019-05-18 ENCOUNTER — Telehealth: Payer: Self-pay | Admitting: Emergency Medicine

## 2019-05-18 NOTE — Telephone Encounter (Signed)
She started doxycycline and mucinex on Thurs for possible flare Btx. She has a progressive papular rash on her neck, face to torso. No dyspnea, UA symptoms, edema.   I asked her to stop the doxycycline as the most likely culprit, told her she can continue the mucinex and follow rash for progression / resolution.   She will call the office on 7/20, report on status of rash and to discuss whether we can try an alternative. She has tolerated azithro in the past.    Baltazar Apo, MD, PhD 05/18/2019, 11:30 AM [facility] [phone] or if no answer [phone]

## 2019-05-19 ENCOUNTER — Telehealth: Payer: Self-pay | Admitting: Nurse Practitioner

## 2019-05-19 MED ORDER — AZITHROMYCIN 250 MG PO TABS: ORAL_TABLET | ORAL | 0 refills | Status: DC

## 2019-05-19 NOTE — Telephone Encounter (Signed)
Primary Pulmonologist: MR Last office visit and with whom: 05/15/2019 with TN What do we see them for (pulmonary problems): bronchiectasis Last OV assessment/plan: Instructions    Return in about 4 weeks (around 06/12/2019) for follow up. Plan -will order doxycycline - flutter valve continue - mucinex twice daily as needed  Followup Follow up with Dr. Chase Caller in 2 weeks or sooner if needed     Was appointment offered to patient (explain)?  Pt wants recommendations   Reason for call: Called and spoke with pt in regards to reaction to doxycyline. Pt stated she did break out from neck down in hives. Pt stated she has had this happen before after being prescribed doxycycline so she knows that this is what it is coming from. Pt stated she noticed this yesterday morning 7/19. Pt stated she first began itching and then noticed the rash.  Pt was told by RB to stop the doxycycline due to this. Pt stated her last dose of doxycyline yesterday morning 7/19. Pt stated she had about 4 days left on the doxycycline.  Asked pt how she has been feeling since virtual visit with TN 7/16 and pt stated the mucus production had become better since beginning the doxy but pt stated since not having an abx last night or today, her cough is back in full force. Pt stated she is unsure if there is any discoloration in the mucus as due to her being at work, pt will swallow the phlegm instead of fully cough it up.  Tonya, please advise if you want to prescribe another abx for pt in place of the doxycycline that was originally prescribed. Pt has tolerated azithromycin in the past per telephone encounter from [facility] yesterday 7/19 and I also confirmed this with pt as well.

## 2019-05-19 NOTE — Telephone Encounter (Signed)
LMTCB for pt.  See telephone note from Dr. Lamonte Sakai on 05/18/2019 for background info.

## 2019-05-19 NOTE — Telephone Encounter (Signed)
I sent in azithromycin. Has patient's rash resolved? She can take benadryl if needed. Please add doxycycline to her allergy list. Thanks.

## 2019-05-19 NOTE — Addendum Note (Signed)
Addended by: Collier Salina on: 05/19/2019 03:49 PM   Modules accepted: Orders

## 2019-05-19 NOTE — Telephone Encounter (Addendum)
Called and spoke to pt. Informed her of the recs per TN. Pt states the rash is not any worse nor better but is aware to take a Benadryl for the rash. Pt is aware to contact our office if she has any increase in SOB, CP/tightness, or worsening rash. Doxycycline has been added to allergy list.   Will forward to TN as FYI.

## 2019-05-19 NOTE — Telephone Encounter (Signed)
Pt is calling back [phone]

## 2019-05-28 ENCOUNTER — Telehealth: Payer: Self-pay

## 2019-05-28 ENCOUNTER — Telehealth: Payer: Self-pay | Admitting: Internal Medicine

## 2019-05-28 NOTE — Telephone Encounter (Signed)
I called patient to let her know that September schedule is ready. I gave her available dates. She wants to consider and call me back on Friday.

## 2019-05-28 NOTE — Telephone Encounter (Signed)

## 2019-05-29 ENCOUNTER — Encounter: Payer: Self-pay | Admitting: Internal Medicine

## 2019-05-29 ENCOUNTER — Ambulatory Visit: Payer: Medicare Other | Admitting: Nurse Practitioner

## 2019-05-29 ENCOUNTER — Other Ambulatory Visit: Payer: Self-pay

## 2019-05-29 ENCOUNTER — Ambulatory Visit (AMBULATORY_SURGERY_CENTER): Payer: Medicare Other | Admitting: Internal Medicine

## 2019-05-29 VITALS — BP 119/59 | HR 71 | Temp 98.2°F | Resp 7 | Ht 64.0 in | Wt 144.0 lb

## 2019-05-29 DIAGNOSIS — Z86010 Personal history of colonic polyps: Secondary | ICD-10-CM | POA: Diagnosis not present

## 2019-05-29 DIAGNOSIS — R110 Nausea: Secondary | ICD-10-CM

## 2019-05-29 MED ORDER — SODIUM CHLORIDE 0.9 % IV SOLN: 500.0000 mL | Freq: Once | INTRAVENOUS | Status: DC

## 2019-05-29 MED ORDER — SODIUM CHLORIDE 0.9 % IV SOLN: 4.0000 mg | Freq: Once | INTRAVENOUS | Status: DC

## 2019-05-29 NOTE — Patient Instructions (Signed)
Be sure to read all handouts given to you by your recovery room nurse.  YOU HAD AN ENDOSCOPIC PROCEDURE TODAY AT [facility]:   Refer to the procedure report that was given to you for any specific questions about what was found during the examination.  If the procedure report does not answer your questions, please call your gastroenterologist to clarify.  If you requested that your care partner not be given the details of your procedure findings, then the procedure report has been included in a sealed envelope for you to review at your convenience later.  YOU SHOULD EXPECT: Some feelings of bloating in the abdomen. Passage of more gas than usual.  Walking can help get rid of the air that was put into your GI tract during the procedure and reduce the bloating. If you had a lower endoscopy (such as a colonoscopy or flexible sigmoidoscopy) you may notice spotting of blood in your stool or on the toilet paper. If you underwent a bowel prep for your procedure, you may not have a normal bowel movement for a few days.  Please Note:  You might notice some irritation and congestion in your nose or some drainage.  This is from the oxygen used during your procedure.  There is no need for concern and it should clear up in a day or so.  SYMPTOMS TO REPORT IMMEDIATELY:   Following lower endoscopy (colonoscopy or flexible sigmoidoscopy):  Excessive amounts of blood in the stool  Significant tenderness or worsening of abdominal pains  Swelling of the abdomen that is new, acute  Fever of 100F or higher   For urgent or emergent issues, a gastroenterologist can be reached at any hour by calling [phone].   DIET:  We do recommend a small meal preferably a bland diet until the nausea is gone.  Then you may proceed to your regular diet.  Drink plenty of fluids but you should avoid alcoholic beverages for 24 hours. Try to eat more fiber in your diet, and drink plenty of water.  ACTIVITY:   You should plan to take it easy for the rest of today and you should NOT DRIVE or use heavy machinery until tomorrow (because of the sedation medicines used during the test).    FOLLOW UP: Our staff will call the number listed on your records 48-72 hours following your procedure to check on you and address any questions or concerns that you may have regarding the information given to you following your procedure. If we do not reach you, we will leave a message.  We will attempt to reach you two times.  During this call, we will ask if you have developed any symptoms of COVID 19. If you develop any symptoms (ie: fever, flu-like symptoms, shortness of breath, cough etc.) before then, please call [phone].  If you test positive for Covid 19 in the 2 weeks post procedure, please call and report this information to [place].    If any biopsies were taken you will be contacted by phone or by letter within the next 1-3 weeks.  Please call us at [phone] if you have not heard about the biopsies in 3 weeks.    SIGNATURES/CONFIDENTIALITY: You and/or your care partner have signed paperwork which will be entered into your electronic medical record.  These signatures attest to the fact that that the information above on your After Visit Summary has been reviewed and is understood.  Full responsibility of the confidentiality of this  discharge information lies with you and/or your care-partner. 

## 2019-05-29 NOTE — Op Note (Signed)
[facility] Patient Name: Cynthia Thornton Procedure Date: 05/29/2019 2:23 PM MRN: [id number] Endoscopist: Jerene Bears , MD Age: 73 Referring MD:  Date of Birth: 09-11-1946 Gender: Female Account #: [id number] Procedure:                Colonoscopy Indications:              High risk colon cancer surveillance: Personal                            history of sessile serrated colon polyp (less than                            10 mm in size) with no dysplasia, Last colonoscopy:                            January 2016 Medicines:                Monitored Anesthesia Care Procedure:                Pre-Anesthesia Assessment:                           - Prior to the procedure, a History and Physical                            was performed, and patient medications and                            allergies were reviewed. The patient's tolerance of                            previous anesthesia was also reviewed. The risks                            and benefits of the procedure and the sedation                            options and risks were discussed with the patient.                            All questions were answered, and informed consent                            was obtained. Prior Anticoagulants: The patient has                            taken no previous anticoagulant or antiplatelet                            agents. ASA Grade Assessment: II - A patient with                            mild systemic disease. After reviewing the risks  and benefits, the patient was deemed in                            satisfactory condition to undergo the procedure.                           After obtaining informed consent, the colonoscope                            was passed under direct vision. Throughout the                            procedure, the patient's blood pressure, pulse, and                            oxygen saturations were monitored continuously. The                            Colonoscope was introduced through the anus and                            advanced to the cecum, identified by appendiceal                            orifice and ileocecal valve. The colonoscopy was                            performed without difficulty. The patient tolerated                            the procedure well. The quality of the bowel                            preparation was excellent. The ileocecal valve,                            appendiceal orifice, and rectum were photographed. Scope In: 2:33:29 PM Scope Out: 2:46:21 PM Scope Withdrawal Time: 0 hours 8 minutes 48 seconds  Total Procedure Duration: 0 hours 12 minutes 52 seconds  Findings:                 The digital rectal exam was normal.                           The entire examined colon appeared normal.                           Internal hemorrhoids were found during                            retroflexion. The hemorrhoids were small. Complications:            No immediate complications. Estimated Blood Loss:     Estimated blood loss: none. Impression:               - The entire examined  colon is normal.                           - Small internal hemorrhoids.                           - No specimens collected. Recommendation:           - Patient has a contact number available for                            emergencies. The signs and symptoms of potential                            delayed complications were discussed with the                            patient. Return to normal activities tomorrow.                            Written discharge instructions were provided to the                            patient.                           - Resume previous diet.                           - Continue present medications.                           - Repeat colonoscopy in 5 years for surveillance. Jerene Bears, MD 05/29/2019 2:51:49 PM This report has been signed electronically.

## 2019-05-29 NOTE — Progress Notes (Signed)
Report given to PACU, vss 

## 2019-05-29 NOTE — Progress Notes (Signed)
See MAR note regarding zofran.  4mg  given at 1510 per order of Dr. Hilarie Fredrickson.  Diluted with NS before given.  Unable to delete the zofran orders in [facility].

## 2019-05-30 ENCOUNTER — Ambulatory Visit (INDEPENDENT_AMBULATORY_CARE_PROVIDER_SITE_OTHER): Payer: Medicare Other

## 2019-05-30 ENCOUNTER — Ambulatory Visit (INDEPENDENT_AMBULATORY_CARE_PROVIDER_SITE_OTHER): Payer: Medicare Other | Admitting: Nurse Practitioner

## 2019-05-30 ENCOUNTER — Ambulatory Visit: Payer: Medicare Other

## 2019-05-30 ENCOUNTER — Encounter: Payer: Self-pay | Admitting: Nurse Practitioner

## 2019-05-30 DIAGNOSIS — R05 Cough: Secondary | ICD-10-CM

## 2019-05-30 DIAGNOSIS — J471 Bronchiectasis with (acute) exacerbation: Secondary | ICD-10-CM

## 2019-05-30 DIAGNOSIS — R059 Cough, unspecified: Secondary | ICD-10-CM

## 2019-05-30 NOTE — Assessment & Plan Note (Signed)
Patient has a tele-visit today for follow-up visit.  She was last seen by me on 05/15/2019 through tele-visit.  She was seen for increased cough with mucus production over the previous 2 weeks that was progressively worsening.  She was treated with doxycycline.  She states that she has not noticed any improvement.  She does continue to take Mucinex and Zyrtec.  She states that she is using flutter valve but only uses it once a day.  She denies any recent fever or significant shortness of breath.   Patient Instructions  Will order sputum samples Will order chest x-ray Continue Mucinex Continue flutter valve -use 3-5 times per day  Follow up: Follow up with Dr. Chase Caller in 6-8 weeks or sooner if needed

## 2019-05-30 NOTE — Patient Instructions (Signed)
Will order sputum samples Will order chest x-ray Continue Mucinex Continue flutter valve -use 3-5 times per day  Follow up: Follow up with Dr. Chase Caller in 6-8 weeks or sooner if needed

## 2019-05-30 NOTE — Progress Notes (Signed)
Virtual Visit via Telephone Note  I connected with Cynthia Thornton on 05/30/19 at 10:30 AM EDT by telephone and verified that I am speaking with the correct person using two identifiers.  Location: Patient: home Provider: office   I discussed the limitations, risks, security and privacy concerns of performing an evaluation and management service by telephone and the availability of in person appointments. I also discussed with the patient that there may be a patient responsible charge related to this service. The patient expressed understanding and agreed to proceed.   History of Present Illness: 73 year old female former smoker with bronchiectasis who is followed by Dr. Chase Caller   Patient has a tele-visit today for follow-up visit.  She was last seen by me on 05/15/2019 through tele-visit.  She was seen for increased cough with mucus production over the previous 2 weeks that was progressively worsening.  She was treated with doxycycline.  She states that she has not noticed any improvement.  She does continue to take Mucinex and Zyrtec.  She states that she is using flutter valve but only uses it once a day.  She denies any recent fever or significant shortness of breath. Denies f/c/s, n/v/d, hemoptysis, PND, leg swelling.   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 No evidence of acute cardiopulmonary disease.  PFT: PFT Results Latest Ref Rng & Units 10/20/2013  FVC-Pre L 3.34  FVC-Predicted Pre % 106  FVC-Post L 3.49  FVC-Predicted Post % 111  Pre FEV1/FVC % % 67  Post FEV1/FCV % % 75  FEV1-Pre L 2.26  FEV1-Predicted Pre % 94  FEV1-Post L 2.62     Assessment and Plan: Patient has a tele-visit today for follow-up visit.  She was last seen by me on 05/15/2019 through tele-visit.  She was seen for increased cough with mucus production over the  previous 2 weeks that was progressively worsening.  She was treated with doxycycline.  She states that she has not noticed any improvement.  She does continue to take Mucinex and Zyrtec.  She states that she is using flutter valve but only uses it once a day.  She denies any recent fever or significant shortness of breath.   Patient Instructions  Will order sputum samples Will order chest x-ray Continue Mucinex Continue flutter valve -use 3-5 times per day  Follow Up Instructions: Follow up with Dr. Chase Caller in 6-8 weeks or sooner if needed   I discussed the assessment and treatment plan with the patient. The patient was provided an opportunity to ask questions and all were answered. The patient agreed with the plan and demonstrated an understanding of the instructions.   The patient was advised to call back or seek an in-person evaluation if the symptoms worsen or if the condition fails to improve as anticipated.  I provided 22 minutes of non-face-to-face time during this encounter.   Fenton Foy, NP

## 2019-06-02 ENCOUNTER — Telehealth: Payer: Self-pay

## 2019-06-02 NOTE — Telephone Encounter (Signed)
  Follow up Call-  Call back number 05/29/2019  Post procedure Call Back phone  # [phone]  Permission to leave phone message Yes  Some recent data might be hidden     Patient questions:  Do you have a fever, pain , or abdominal swelling? No. Pain Score  0 *  Have you tolerated food without any problems? Yes.    Have you been able to return to your normal activities? Yes.    Do you have any questions about your discharge instructions: Diet   No. Medications  No. Follow up visit  No.  Do you have questions or concerns about your Care? No.  Actions: * If pain score is 4 or above: No action needed, pain <4. 1. Have you developed a fever since your procedure? no  2.   Have you had an respiratory symptoms (SOB or cough) since your procedure? no  3.   Have you tested positive for COVID 19 since your procedure no  4.   Have you had any family members/close contacts diagnosed with the COVID 19 since your procedure?  no   If yes to any of these questions please route to Joylene John, RN and Alphonsa Gin, Therapist, sports.

## 2019-06-04 NOTE — Telephone Encounter (Signed)
Patient called back today. I scheduled her for 07/16/19 at her request at [facility].  I advised patient regarding need for Covid screen and scheduled appt. She was advised regarding need to quarantine from time of test until surgery.  Will send her the Covid test info sheet as well as [facility] pamphlet. Pre op appt with TF was also scheduled.

## 2019-06-06 ENCOUNTER — Telehealth: Payer: Self-pay

## 2019-06-06 NOTE — Telephone Encounter (Signed)
I called patient and let her know that her surgery start time had changed from 9:00am to 8:30am. She noted the change.

## 2019-06-10 ENCOUNTER — Telehealth: Payer: Self-pay

## 2019-06-10 NOTE — Telephone Encounter (Signed)
Patient called because her sister will be coming in from [place] the night before her surgery to take her to surgery and bring her home.  She was asking how this works with her Insurance risk surveyor. I called and left message for preadmitting nurse. Langley Gauss called me back and said she had spoken with [employer]. And she told her that for 2 weeks prior to coming sister needs to be very careful to lower risk but washing hands, social distancing and masking. Only going out to places absolutely necessary. Once she arrives they should mask and social distance and sleep in separate rooms.  I called patient and per DPR access note on file I left detailed message in voice mail.

## 2019-06-19 ENCOUNTER — Other Ambulatory Visit: Payer: Self-pay

## 2019-06-19 DIAGNOSIS — Z20822 Contact with and (suspected) exposure to covid-19: Secondary | ICD-10-CM

## 2019-06-20 LAB — NOVEL CORONAVIRUS, NAA: SARS-CoV-2, NAA: NOT DETECTED

## 2019-06-24 ENCOUNTER — Telehealth: Payer: Self-pay

## 2019-06-24 NOTE — Telephone Encounter (Signed)
Patient called to cancel surgery scheduled 07/16/19 at 8:30am at [facility].  She said that she has concerns that taking 2 weeks off to recover could cause her to lose her job.  She said that she would like to postpone to Dec when she is off from 12/11 to new year.  I will cancel all appts and call her with December schedule.

## 2019-06-24 NOTE — Telephone Encounter (Signed)
Okay.  Of note though I would not be the surgeon as I will not be scheduling surgeries at that time due to my impending retirement.

## 2019-06-24 NOTE — Telephone Encounter (Signed)
Called patient back and left detailed message advising patient that Dr. Loetta Rough will not be performing surgery in December due to Jan retirement.

## 2019-06-24 NOTE — Telephone Encounter (Signed)
Left message to call me.

## 2019-06-26 ENCOUNTER — Telehealth: Payer: Self-pay

## 2019-06-26 ENCOUNTER — Telehealth: Payer: Self-pay | Admitting: Primary Care

## 2019-06-26 NOTE — Telephone Encounter (Signed)
lmtcb for pt. Appt changed from televisit to regular OV.

## 2019-06-26 NOTE — Telephone Encounter (Signed)
Attempted to call to see if patient would like to change televisit to inoffice visit on 06/27/19 since Covid test Neg 8/208/20.  If patient can verify 'no new symptoms' she can be changed to in-office visit per B. Volanda Napoleon, NP.  Left message on voicemail for patient to call us.

## 2019-06-26 NOTE — Telephone Encounter (Signed)
She just had negative covid test on 8/20. If she has no new symptoms and hasn't been exposure she is welcome to come in for an office visit instead of televisit with me tomorrow if she would like.

## 2019-06-26 NOTE — Progress Notes (Signed)
@Patient  ID: Cynthia Thornton, female    DOB: 1945-12-10, 73 y.o.   MRN: [id number]  No chief complaint on file.   Referring provider: Colon Branch, MD  HPI: 73 year old female, former smoker quit 1997 (3 pack history; her husband was a smoker). PMH significant for bronchiectasis, GERD. Patient of Dr. Chase Caller, last seen by pulmonary NP on 05/30/19. Treated for acute exacerbation with doxycycline. CXR showed COPD and chronic lingular scarring. Sputum sample ordered but not completed. Maintained on flutter valve and prn mucinex. Covid testing negative on 06/19/19.   06/27/2019 Patient presents today for regular follow-up. Feels 90% improved since bronchiectasis exacerbation at the end of July. Mucus has cleared up, one episode of hemoptysis. Picked up sputum culture, she was not aware she had to bring back in 48 hours. She had gone a least a year without flare of her bronchiectasis. Since she has started using flutter valve her symptoms have been stable. Using mucinex as needed. Denies fever, shortness of breath or wheezing.   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   Allergies  Allergen Reactions  . Doxycycline Rash  . Oxycodone   . Codeine Nausea Only  . Propranolol Hcl Other (See Comments)    dizziness    Immunization History  Administered Date(s) Administered  . Influenza Split 08/31/2011, 07/30/2014  . Influenza Whole 08/30/2009, 07/30/2010  . Influenza, High Dose Seasonal PF 07/05/2018  . Influenza,inj,Quad PF,6+ Mos 09/30/2012, 10/10/2013, 07/26/2015  . Influenza-Unspecified 08/18/2016  . Pneumococcal Conjugate-13 10/10/2013  . Pneumococcal Polysaccharide-23 07/27/2015  . Td 12/02/2008  . Tdap 01/03/2019  . Zoster 12/02/2008  . Zoster Recombinat (Shingrix) 01/03/2019    Past Medical History:  Diagnosis Date  . Arthritis   . Cancer ([place])  09/27/2017   left breast genetic screening panel 2017 with variant of unknown significance AXINA  . Esophageal reflux 08/15/2006   EGD(Chronic)  . Esophageal stricture 08/15/2006   EGD  . Family history of breast cancer   . Family history of colon cancer   . Hiatal hernia 08/15/2006   EGD  . Hyperthyroidism 2010   no meds now  . Insomnia   . Osteopenia 02/2018   T score -2.4 distal third of radius.  FRAX 11% / 1.8% stable at other points of interest to include spine, right and left hip  . Personal history of colonic polyps 09/06/2001   hyperplastic  . Pityriasis rosea   . Primary osteoarthritis of left knee    Severe Patellofemoral arthritis  . 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 Packs/day: 0.10  . Years: 30.00  . Pack years: 3.00  . Quit date: 10/31/1995  . Years since quitting: 23.6  Smokeless Tobacco Never Used   Counseling given: Not Answered   Outpatient Medications Prior to Visit  Medication Sig Dispense Refill  . aspirin EC 81 MG tablet Take 81 mg by mouth daily.    . B Complex-C (B-COMPLEX WITH VITAMIN C) tablet Take 1 tablet by mouth daily.    . cetirizine (ZYRTEC) 10 MG tablet Take 10 mg by mouth daily.    . Cholecalciferol (VITAMIN D) 2000 units CAPS Take 2,000 Units by mouth daily.    . clorazepate (TRANXENE) 7.5 MG tablet Take 1 tablet (7.5 mg  total) by mouth at bedtime as needed for anxiety. 30 tablet 2  . famotidine (PEPCID) 20 MG tablet Take 1 tablet (20 mg total) by mouth 2 (two) times daily. 180 tablet 1  . guaiFENesin-dextromethorphan (ROBITUSSIN DM) 100-10 MG/5ML syrup Take 5 mLs by mouth every 4 (four) hours as needed for cough. Every 4 - 6 hours as needed.    . promethazine (PHENERGAN) 25 MG suppository Place 1 suppository (25 mg total) rectally every 12 (twelve) hours as needed for nausea or vomiting. 30 each 1  .  Respiratory Therapy Supplies (FLUTTER) DEVI 1 Units by Does not apply route as needed. 1 each 0  . azithromycin (ZITHROMAX) 250 MG tablet Take 2 tablets (500 mg) on day 1, then take 1 tablet (250 mg) on days 2-5 (Patient not taking: Reported on 05/29/2019) 6 tablet 0   No facility-administered medications prior to visit.    Review of Systems  Review of Systems  Constitutional: Negative.   HENT: Positive for tinnitus.   Respiratory: Positive for cough. Negative for chest tightness, shortness of breath and wheezing.   Cardiovascular: Negative.   Neurological: Negative for dizziness, speech difficulty, weakness and headaches.  Psychiatric/Behavioral: Negative.     Physical Exam  BP (!) 104/56 (BP Location: Right Arm, Cuff Size: Normal)   Pulse 61   Temp 97.6 F (36.4 C) (Oral)   Ht 5\' 4"  (1.626 m)   Wt 140 lb 3.2 oz (63.6 kg)   SpO2 99%   BMI 24.07 kg/m  Physical Exam Constitutional:      Appearance: Normal appearance. She is not ill-appearing.  HENT:     Head: Normocephalic and atraumatic.     Right Ear: Tympanic membrane and ear canal normal. There is no impacted cerumen.     Left Ear: Tympanic membrane and ear canal normal. There is no impacted cerumen.     Nose: Nose normal.     Mouth/Throat:     Mouth: Mucous membranes are moist.     Pharynx: Oropharynx is clear.  Cardiovascular:     Rate and Rhythm: Normal rate and regular rhythm.  Pulmonary:     Effort: Pulmonary effort is normal.     Breath sounds: Normal breath sounds.  Skin:    General: Skin is warm and dry.  Neurological:     General: No focal deficit present.     Mental Status: She is alert and oriented to person, place, and time. Mental status is at baseline.  Psychiatric:        Mood and Affect: Mood normal.        Behavior: Behavior normal.        Thought Content: Thought content normal.        Judgment: Judgment normal.      Lab Results:  CBC    Component Value Date/Time   WBC 6.3 12/16/2018  1038   RBC 4.19 12/16/2018 1038   HGB 12.8 12/16/2018 1038   HGB 13.0 10/03/2017 0837   HCT 37.6 12/16/2018 1038   HCT 37.6 10/03/2017 0837   PLT 293.0 12/16/2018 1038   PLT 305 10/03/2017 0837   MCV 89.7 12/16/2018 1038   MCV 88.3 10/03/2017 0837   MCH 30.5 10/03/2017 0837   MCH 27.8 12/08/2016 1537   MCHC 34.1 12/16/2018 1038   RDW 13.4 12/16/2018 1038   RDW 13.4 10/03/2017 0837   LYMPHSABS 1.4 12/16/2018 1038   LYMPHSABS 1.5 10/03/2017 0837   MONOABS 0.6 12/16/2018 1038   MONOABS 0.5 10/03/2017 0837  EOSABS 0.2 12/16/2018 1038   EOSABS 0.2 10/03/2017 0837   BASOSABS 0.0 12/16/2018 1038   BASOSABS 0.1 10/03/2017 0837    BMET    Component Value Date/Time   NA 136 12/20/2018 1405   NA 134 (L) 10/03/2017 0837   K 4.4 12/20/2018 1405   K 4.3 10/03/2017 0837   CL 101 12/20/2018 1405   CO2 29 12/20/2018 1405   CO2 23 10/03/2017 0837   GLUCOSE 117 (H) 12/20/2018 1405   GLUCOSE 90 10/03/2017 0837   BUN 18 12/20/2018 1405   BUN 24.3 10/03/2017 0837   CREATININE 0.93 12/20/2018 1405   CREATININE 1.0 10/03/2017 0837   CALCIUM 9.1 12/20/2018 1405   CALCIUM 9.7 10/03/2017 0837   GFRNONAA 46 (L) 03/04/2016 0251   GFRAA 54 (L) 03/04/2016 0251    BNP No results found for: BNP  ProBNP No results found for: PROBNP  Imaging: Dg Chest 2 View  Result Date: 05/30/2019 CLINICAL DATA:  Cough EXAM: CHEST - 2 VIEW COMPARISON:  05/25/2016 FINDINGS: Cardiac shadow is within normal limits. Mild hyperinflation of the lungs is noted bilaterally. Some scarring is again seen in the region of the lingula stable from prior CT examination. No focal infiltrate or effusion is seen. No acute bony abnormality is noted. IMPRESSION: COPD and chronic lingular scarring.  No acute abnormality noted. Electronically Signed   By: Inez Catalina M.D.   On: 05/30/2019 11:39     Assessment & Plan:   Bronchiectasis - Improved; once exacerbation in the last year  - Continue FLUTTER daily - Take mucinex  twice daily as needed for chest congestion  - If needed can add hypertonic saline nebulizer's    Tinnitus - Exam benign  - Advised she take oral decongestant for a week or two - Flonase nasal spray daily - If not better follow up with PCP- may consider referral to ENT or neurology    Martyn Ehrich, NP 06/27/2019

## 2019-06-26 NOTE — Telephone Encounter (Signed)
error 

## 2019-06-26 NOTE — Telephone Encounter (Signed)
She would have a co-pay if she came into office. Likely wont see anything of significant on ear exam that would show tinnitus. Usually inner ear but willing to look if she wants a office visit.

## 2019-06-26 NOTE — Telephone Encounter (Signed)
Called and spoke to pt. Pt initially made a televisit as she had a runny nose, sinus congestion, sneezing last week. She got a covid test and results are negative. Pt is now c/o tinnitus for the last 2 weeks and is questioning since she had a negative covid test and if she were to change her televisit to an in-office visit would Beth be willing to look in her ears and evaluate. Pt doesn't want to have to pay two co-pays (pulmonary and then PCP). Pt has appt with Beth on 8/28.   Beth please advise. Thanks.

## 2019-06-27 ENCOUNTER — Ambulatory Visit: Payer: Medicare Other | Admitting: Nurse Practitioner

## 2019-06-27 ENCOUNTER — Ambulatory Visit: Payer: Medicare Other | Admitting: Primary Care

## 2019-06-27 ENCOUNTER — Encounter: Payer: Self-pay | Admitting: Primary Care

## 2019-06-27 ENCOUNTER — Other Ambulatory Visit: Payer: Self-pay

## 2019-06-27 DIAGNOSIS — H9319 Tinnitus, unspecified ear: Secondary | ICD-10-CM | POA: Insufficient documentation

## 2019-06-27 DIAGNOSIS — H9313 Tinnitus, bilateral: Secondary | ICD-10-CM

## 2019-06-27 HISTORY — DX: Tinnitus, unspecified ear: H93.19

## 2019-06-27 NOTE — Patient Instructions (Addendum)
Bronchiectasis: Continue FLUTTER daily Take mucinex as needed for chest congestion   Tinnitus: Take decongestant for a week or two Flonase nasal spray daily If not better follow up with PCP- may consider referral to ENT or neurology   Follow-up: 1 year with Dr. Chase Caller or if symptoms flare (get sputum culture)   Tinnitus Tinnitus refers to hearing a sound when there is no actual source for that sound. This is often described as ringing in the ears. However, people with this condition may hear a variety of noises, in one ear or in both ears. The sounds of tinnitus can be soft, loud, or somewhere in between. Tinnitus can last for a few seconds or can be constant for days. It may go away without treatment and come back at various times. When tinnitus is constant or happens often, it can lead to other problems, such as trouble sleeping and trouble concentrating. Almost everyone experiences tinnitus at some point. Tinnitus that is long-lasting (chronic) or comes back often (recurs) may require medical attention. What are the causes? The cause of tinnitus is often not known. In some cases, it can result from other problems or conditions, including:  Exposure to loud noises from machinery, music, or other sources.  Hearing loss.  Ear or sinus infections.  Earwax buildup.  An object (foreign body) stuck in the ear.  Taking certain medicines.  Drinking alcohol or caffeine.  High blood pressure.  Heart diseases.  Anemia.  Allergies.  Meniere's disease.  Thyroid problems.  Tumors.  A weak, bulging blood vessel (aneurysm) near the ear.  Depression or other mood disorders. What are the signs or symptoms? The main symptom of tinnitus is hearing a sound when there is no source for that sound. It may sound like:  Buzzing.  Roaring.  Ringing.  Blowing air, like the sound heard when you listen to a seashell.  Hissing.  Whistling.  Sizzling.  Humming.  Running  water.  A musical note.  Tapping. Symptoms may affect only one ear (unilateral) or both ears (bilateral). How is this diagnosed? Tinnitus is diagnosed based on your symptoms, your medical history, and a physical exam. Your health care provider may do a thorough hearing test (audiologic exam) if your tinnitus:  Is unilateral.  Causes hearing difficulties.  Lasts 6 months or longer. You may work with a health care provider who specializes in hearing disorders (audiologist). You may be asked questions about your symptoms and how they affect your daily life. You may have other tests done, such as:  CT scan.  MRI.  An imaging test of how blood flows through your blood vessels (angiogram). How is this treated? Treating an underlying medical condition can sometimes make tinnitus go away. If your tinnitus continues, other treatments may include:  Medicines, such as antidepressants or sleeping aids.  Sound generators to mask the tinnitus. These include: ? Tabletop sound machines that play relaxing sounds to help you fall asleep. ? Wearable devices that fit in your ear and play sounds or music. ? Acoustic neural stimulation. This involves using headphones to listen to music that contains an auditory signal. Over time, listening to this signal may change some pathways in your brain and make you less sensitive to tinnitus. This treatment is used for very severe cases when no other treatment is working.  Therapy and counseling to help you manage the stress of living with tinnitus.  Using hearing aids or cochlear implants if your tinnitus is related to hearing loss. Hearing aids  are worn in the outer ear. Cochlear implants are surgically placed in the inner ear. Follow these instructions at home: Managing symptoms      When possible, avoid being in loud places and being exposed to loud sounds.  Wear hearing protection, such as earplugs, when you are exposed to loud noises.  Use a Mierzejewski  noise machine, a humidifier, or other devices to mask the sound of tinnitus.  Practice techniques for reducing stress, such as meditation, yoga, or deep breathing. Work with your health care provider if you need help with managing stress.  Sleep with your head slightly raised. This may reduce the impact of tinnitus. General instructions  Do not use stimulants, such as nicotine, alcohol, or caffeine. Talk with your health care provider about other stimulants to avoid. Stimulants are substances that can make you feel alert and attentive by increasing certain activities in the body (such as heart rate and blood pressure). These substances may make tinnitus worse.  Take over-the-counter and prescription medicines only as told by your health care provider.  Try to get plenty of sleep each night.  Keep all follow-up visits as told by your health care provider. This is important. Contact a health care provider if:  Your tinnitus continues for 3 weeks or longer without stopping.  Your symptoms get worse or do not get better with home care.  You develop tinnitus after a head injury.  You have tinnitus along with any of the following: ? Dizziness. ? Loss of balance. ? Nausea and vomiting. Summary  Tinnitus refers to hearing a sound when there is no actual source for that sound. This is often described as ringing in the ears.  Symptoms may affect only one ear (unilateral) or both ears (bilateral).  Use a Faulks noise machine, a humidifier, or other devices to mask the sound of tinnitus.  Do not use stimulants, such as nicotine, alcohol, or caffeine. Talk with your health care provider about other stimulants to avoid. These substances may make tinnitus worse. This information is not intended to replace advice given to you by your health care provider. Make sure you discuss any questions you have with your health care provider. Document Released: 10/16/2005 Document Revised: 09/28/2017 Document  Reviewed: 07/26/2017 Elsevier Patient Education  2020 [employer].    Bronchiectasis  Bronchiectasis is a condition in which the airways in the lungs (bronchi) are damaged and widened. The condition makes it hard for the lungs to get rid of mucus, and it causes mucus to gather in the bronchi. This condition often leads to lung infections, which can make the condition worse. What are the causes? You can be born with this condition or you can develop it later in life. Common causes of this condition include:  Cystic fibrosis.  Repeated lung infections, such as pneumonia or tuberculosis.  An object or other blockage in the lungs.  Breathing in fluid, food, or other objects (aspiration).  A problem with the immune system and lung structure that is present at birth (congenital). Sometimes the cause is not known. What are the signs or symptoms? Common symptoms of this condition include:  A daily cough that brings up mucus and lasts for more than 3 weeks.  Lung infections that happen often.  Shortness of breath and wheezing.  Weakness and fatigue. How is this diagnosed? This condition is diagnosed with tests, such as:  Chest X-rays or CT scans. These are done to check for changes in the lungs.  Breathing tests. These  are done to check how well your lungs are working.  A test of a sample of your saliva (sputum culture). This test is done to check for infection.  Blood tests and other tests. These are done to check for related diseases or causes. How is this treated? Treatment for this condition depends on the severity of the illness and its cause. Treatment may include:  Medicines that loosen mucus so it can be coughed up (expectorants).  Medicines that relax the muscles of the bronchi (bronchodilators).  Antibiotic medicines to prevent or treat infection.  Physical therapy to help clear mucus from the lungs. Techniques may include: ? Postural drainage. This is when you  sit or lie in certain positions so that mucus can drain by gravity. ? Chest percussion. This involves tapping the chest or back with a cupped hand. ? Chest vibration. For this therapy, a hand or special equipment vibrates your chest and back.  Surgery to remove the affected part of the lung. This may be done in severe cases. Follow these instructions at home: Medicines  Take over-the-counter and prescription medicines only as told by your health care provider.  If you were prescribed an antibiotic medicine, take it as told by your health care provider. Do not stop taking the antibiotic even if you start to feel better.  Avoid taking sedatives and antihistamines unless your health care provider tells you to take them. These medicines tend to thicken the mucus in the lungs. Managing symptoms  Perform breathing exercises or techniques to clear your lungs as told by your health care provider.  Consider using a cold steam vaporizer or humidifier in your room or home to help loosen secretions.  If you have a cough that gets worse at night, try sleeping in a semi-upright position. General instructions  Get plenty of rest.  Drink enough fluid to keep your urine clear or pale yellow.  Stay inside when pollution and ozone levels are high.  Stay up to date with vaccinations and immunizations.  Avoid cigarette smoke and other lung irritants.  Do not use any products that contain nicotine or tobacco, such as cigarettes and e-cigarettes. If you need help quitting, ask your health care provider.  Keep all follow-up visits as told by your health care provider. This is important. Contact a health care provider if:  You cough up more sputum than before and the sputum is yellow or green in color.  You have a fever.  You cannot control your cough and are losing sleep. Get help right away if:  You cough up blood.  You have chest pain.  You have increasing shortness of breath.  You have  pain that gets worse or is not controlled with medicines.  You have a fever and your symptoms suddenly get worse. Summary  Bronchiectasis is a condition in which the airways in the lungs (bronchi) are damaged and widened. The condition makes it hard for the lungs to get rid of mucus, and it causes mucus to gather in the bronchi.  Treatment usually includes therapy to help clear mucus from the lungs.  Stay up to date with vaccinations and immunizations. This information is not intended to replace advice given to you by your health care provider. Make sure you discuss any questions you have with your health care provider. Document Released: 08/13/2007 Document Revised: 09/28/2017 Document Reviewed: 11/20/2016 Elsevier Patient Education  2020 [employer].

## 2019-06-27 NOTE — Assessment & Plan Note (Signed)
-   Exam benign  - Advised she take oral decongestant for a week or two - Flonase nasal spray daily - If not better follow up with PCP- may consider referral to ENT or neurology

## 2019-06-27 NOTE — Telephone Encounter (Signed)
Patient being seen in office today. Nothing further needed.

## 2019-06-27 NOTE — Assessment & Plan Note (Signed)
-   Improved; once exacerbation in the last year  - Continue FLUTTER daily - Take mucinex twice daily as needed for chest congestion  - If needed can add hypertonic saline nebulizer's

## 2019-07-05 ENCOUNTER — Telehealth: Payer: Self-pay | Admitting: Pulmonary Disease

## 2019-07-05 MED ORDER — AZITHROMYCIN 250 MG PO TABS: ORAL_TABLET | ORAL | 0 refills | Status: DC

## 2019-07-05 NOTE — Telephone Encounter (Signed)
Patient called to report intermittent hemoptysis for the past 1 day.  Reports flecks of with red sputum.  Denies any fevers, chills, dyspnea.  No sick contacts Called in Z-Pak to her pharmacy.

## 2019-07-11 ENCOUNTER — Ambulatory Visit: Payer: Medicare Other | Admitting: Gynecology

## 2019-07-11 ENCOUNTER — Telehealth: Payer: Self-pay | Admitting: Internal Medicine

## 2019-07-11 MED ORDER — OMEPRAZOLE 40 MG PO CPDR: 40.0000 mg | DELAYED_RELEASE_CAPSULE | Freq: Every day | ORAL | 3 refills | Status: DC

## 2019-07-11 NOTE — Telephone Encounter (Signed)
Pt states that pepcid is not working, she would like to go back to take prilosec in the morning if possible.

## 2019-07-11 NOTE — Telephone Encounter (Signed)
Spoke with pt and she states she tried to d/c the prilosec 40mg  in the am but she has had issues with reflux since stopping it. States she wants to resume the prilosec 40mg  in the am and continue the pepcid in the evening. Pt requests script be sent to pharmacy. Dr. Hilarie Fredrickson aware and script sent in for pt.

## 2019-07-12 ENCOUNTER — Other Ambulatory Visit (HOSPITAL_COMMUNITY): Payer: Medicare Other

## 2019-07-14 ENCOUNTER — Telehealth: Payer: Self-pay | Admitting: Hematology and Oncology

## 2019-07-14 NOTE — Telephone Encounter (Signed)
[place] 10/7. Moved f/u from PM to AM. Confirmed with patient.

## 2019-07-16 ENCOUNTER — Ambulatory Visit (HOSPITAL_BASED_OUTPATIENT_CLINIC_OR_DEPARTMENT_OTHER): Admit: 2019-07-16 | Payer: Medicare Other | Admitting: Gynecology

## 2019-07-16 ENCOUNTER — Encounter (HOSPITAL_BASED_OUTPATIENT_CLINIC_OR_DEPARTMENT_OTHER): Payer: Self-pay

## 2019-07-16 SURGERY — SALPINGO-OOPHORECTOMY, BILATERAL, LAPAROSCOPIC
Anesthesia: General | Laterality: Bilateral

## 2019-07-29 ENCOUNTER — Other Ambulatory Visit: Payer: Self-pay

## 2019-07-30 ENCOUNTER — Ambulatory Visit (INDEPENDENT_AMBULATORY_CARE_PROVIDER_SITE_OTHER): Payer: Medicare Other | Admitting: Obstetrics & Gynecology

## 2019-07-30 ENCOUNTER — Encounter: Payer: Self-pay | Admitting: Obstetrics & Gynecology

## 2019-07-30 VITALS — BP 128/80

## 2019-07-30 DIAGNOSIS — Z853 Personal history of malignant neoplasm of breast: Secondary | ICD-10-CM | POA: Diagnosis not present

## 2019-07-30 DIAGNOSIS — Z803 Family history of malignant neoplasm of breast: Secondary | ICD-10-CM | POA: Diagnosis not present

## 2019-07-30 DIAGNOSIS — Z8041 Family history of malignant neoplasm of ovary: Secondary | ICD-10-CM

## 2019-07-30 NOTE — Patient Instructions (Signed)
1. History of breast cancer Personal h/o Breast Ca with intolerance to the Anti-Estrogen treatment.  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  Fam h/o Ovarian Ca and Fam h/o Breast Ca.  Decision to proceed with LPS BSO.  Surgery and risks thoroughly reviewed.  Preop management and postop precautions reviewed.  Patient voiced understanding and agreement with plan.  2. Family history of ovarian cancer As above.  3. Family history of breast cancer As above.                        Patient was counseled as to the risk of surgery to include the following:  1. Infection (prohylactic antibiotics will be administered)  2. DVT/Pulmonary Embolism (prophylactic pneumo compression stockings will be used)  3.Trauma to internal organs requiring additional surgical procedure to repair any injury to internal organs requiring perhaps additional hospitalization days.  4.Hemmorhage requiring transfusion and blood products which carry risks such as anaphylactic reaction, hepatitis and AIDS.  Patient had received literature information on the procedure scheduled and all her questions were answered and fully accepts all risks.  Kaidynn, it was a pleasure meeting you today!

## 2019-07-30 NOTE — Progress Notes (Signed)
    Cynthia Thornton Nov 20, 1945 [id number]        73 y.o.  [id number]   RP: Preop LPS BSO   HPI: Personal history of breast cancer and a family history of breast cancer in a sister, cousin and maternal aunt.  Also has ovarian cancer in a separate maternal aunt.  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  Recent CA 125 was 5.  Ultrasound transvaginal 12/2018 status post hysterectomy shows normal vaginal cuff.  Normal-appearing right ovary.  Left ovary not visualized.  Left adnexa without visual pathology.  Cul-de-sac negative   OB History  Gravida Para Term Preterm AB Living  3 2 2   1 2   SAB TAB Ectopic Multiple Live Births               # Outcome Date GA Lbr Len/2nd Weight Sex Delivery Anes PTL Lv  3 AB           2 Term           1 Term             Past medical history,surgical history, problem list, medications, allergies, family history and social history were all reviewed and documented in the EPIC chart.   Directed ROS with pertinent positives and negatives documented in the history of present illness/assessment and plan.  Exam:  Vitals:   07/30/19 1444  BP: 128/80   General appearance:  Normal  Pelvic US 12/2018:  T/V images status post hysterectomy shows normal vaginal cuff.  Normal-appearing right ovary.  Left ovary not visualized.  Left adnexa without visual pathology.  Cul-de-sac negative   Assessment/Plan:  73 y.o. [id number]   1. History of breast cancer Personal h/o Breast Ca with intolerance to the Anti-Estrogen treatment.  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  Fam h/o Ovarian Ca and Fam h/o Breast Ca.  Decision to proceed with LPS BSO.  Surgery and risks thoroughly reviewed.  Preop management and postop precautions reviewed.  Patient voiced understanding and agreement with plan.  2. Family history of ovarian cancer As above.  3. Family history of breast cancer As above.                        Patient  was counseled as to the risk of surgery to include the following:  1. Infection (prohylactic antibiotics will be administered)  2. DVT/Pulmonary Embolism (prophylactic pneumo compression stockings will be used)  3.Trauma to internal organs requiring additional surgical procedure to repair any injury to internal organs requiring perhaps additional hospitalization days.  4.Hemmorhage requiring transfusion and blood products which carry risks such as anaphylactic reaction, hepatitis and AIDS.  Patient had received literature information on the procedure scheduled and all her questions were answered and fully accepts all risks.   [name] MD, 8:57 PM 07/30/2019

## 2019-08-05 NOTE — Progress Notes (Signed)
Patient Care Team: Colon Branch, MD as PCP - Gaston Islam, MD as Consulting Physician (Orthopedic Surgery) Nicholas Lose, MD as Consulting Physician (Hematology and Oncology) [name], MD as Consulting Physician (General Surgery) Fontaine, Belinda Block, MD as Consulting Physician (Gynecology) Brand Males, MD as Consulting Physician (Pulmonary Disease) Kyung Rudd, MD as Consulting Physician (Radiation Oncology) Gardenia Phlegm, NP as Nurse Practitioner (Hematology and Oncology) Ardis Hughs, MD as Attending Physician (Urology)  DIAGNOSIS:    ICD-10-CM   1. 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 with above-mentioned history of left breast cancer treated with lumpectomy, radiation, and who could not tolerate antiestrogen therapy with letrozole. She is currently on surveillance. I last saw her 1 year ago. Left breast biopsy on 01/13/19 showed no evidence of malignancy. She presents to the clinic today for annual  follow-up.  Other than sensitivity of the surgical scar she is doing very well.  She is still working at [employer] 2 days a week.  REVIEW OF SYSTEMS:   Constitutional: Denies fevers, chills or abnormal weight loss Eyes: Denies blurriness of vision Ears, nose, mouth, throat, and face: Denies mucositis or sore throat Respiratory: Denies cough, dyspnea or wheezes Cardiovascular: Denies palpitation, chest discomfort Gastrointestinal: Denies nausea, heartburn or change in bowel habits Skin: Denies abnormal skin rashes Lymphatics: Denies new lymphadenopathy or easy bruising Neurological: Denies numbness, tingling or new weaknesses Behavioral/Psych: Mood is stable, no new changes  Extremities: No lower extremity edema Breast: denies any pain or lumps or nodules in either breasts All other systems were reviewed with the patient and are negative.  I have reviewed the past medical history, past surgical history, social history and family history with the patient and they are unchanged from previous note.  ALLERGIES:  is allergic to doxycycline; oxycodone; codeine; and propranolol hcl.  MEDICATIONS:  Current Outpatient Medications  Medication Sig Dispense Refill   aspirin EC 81 MG tablet Take 81 mg by mouth daily.     azithromycin (ZITHROMAX) 250 MG tablet 500 mg in a single loading dose on day 1, followed by 250 mg once daily on days 2 to 5 6 tablet 0   B Complex-C (B-COMPLEX WITH VITAMIN C) tablet Take 1 tablet by mouth daily.     cetirizine (ZYRTEC) 10 MG tablet Take 10 mg by mouth daily.     Cholecalciferol (VITAMIN D) 2000 units CAPS Take 2,000 Units by mouth daily.     clorazepate (TRANXENE) 7.5 MG tablet Take 1 tablet (7.5 mg total) by  mouth at bedtime as needed for anxiety. 30 tablet 2   famotidine (PEPCID) 20 MG tablet Take 1 tablet (20 mg total) by mouth 2 (two) times daily. 180 tablet 1   guaiFENesin-dextromethorphan (ROBITUSSIN DM) 100-10 MG/5ML syrup Take 5 mLs by mouth every 4  (four) hours as needed for cough. Every 4 - 6 hours as needed.     omeprazole (PRILOSEC) 40 MG capsule Take 1 capsule (40 mg total) by mouth daily. 90 capsule 3   promethazine (PHENERGAN) 25 MG suppository Place 1 suppository (25 mg total) rectally every 12 (twelve) hours as needed for nausea or vomiting. 30 each 1   Respiratory Therapy Supplies (FLUTTER) DEVI 1 Units by Does not apply route as needed. 1 each 0   No current facility-administered medications for this visit.     PHYSICAL EXAMINATION: ECOG PERFORMANCE STATUS: 0 - Asymptomatic  Vitals:   08/06/19 1035  BP: 120/84  Pulse: 71  Resp: 18  Temp: 98.5 F (36.9 C)  SpO2: 100%   Filed Weights   08/06/19 1035  Weight: 142 lb 4.8 oz (64.5 kg)    GENERAL: alert, no distress and comfortable SKIN: skin color, texture, turgor are normal, no rashes or significant lesions EYES: normal, Conjunctiva are pink and non-injected, sclera clear OROPHARYNX: no exudate, no erythema and lips, buccal mucosa, and tongue normal  NECK: supple, thyroid normal size, non-tender, without nodularity LYMPH: no palpable lymphadenopathy in the cervical, axillary or inguinal LUNGS: clear to auscultation and percussion with normal breathing effort HEART: regular rate & rhythm and no murmurs and no lower extremity edema ABDOMEN: abdomen soft, non-tender and normal bowel sounds MUSCULOSKELETAL: no cyanosis of digits and no clubbing  NEURO: alert & oriented x 3 with fluent speech, no focal motor/sensory deficits EXTREMITIES: No lower extremity edema BREAST: No palpable masses or nodules in either right or left breasts. No palpable axillary supraclavicular or infraclavicular adenopathy no breast tenderness or nipple discharge. (exam performed in the presence of a chaperone)  LABORATORY DATA:  I have reviewed the data as listed CMP Latest Ref Rng & Units 12/20/2018 12/16/2018 10/03/2017  Glucose 70 - 99 mg/dL 117(H) 87 90  BUN 6 - 23 mg/dL 18 18 24.3    Creatinine 0.40 - 1.20 mg/dL 0.93 0.96 1.0  Sodium 135 - 145 mEq/L 136 134(L) 134(L)  Potassium 3.5 - 5.1 mEq/L 4.4 5.9(H) 4.3  Chloride 96 - 112 mEq/L 101 98 -  CO2 19 - 32 mEq/L _0 Calcium 8.4 - 10.5 mg/dL 9.1 9.8 9.7  Total Protein 6.0 - 8.3 g/dL - 6.5 7.7  Total Bilirubin 0.2 - 1.2 mg/dL - 0.5 0.44  Alkaline Phos 39 - 117 U/L - 90 107  AST 0 - 37 U/L - 14 21  ALT 0 - 35 U/L - 13 17    Lab Results  Component Value Date   WBC 6.3 12/16/2018   HGB 12.8 12/16/2018   HCT 37.6 12/16/2018   MCV 89.7 12/16/2018   PLT 293.0 12/16/2018   NEUTROABS 4.1 12/16/2018    ASSESSMENT & PLAN:  Malignant neoplasm of lower-inner quadrant of left breast in female, estrogen receptor positive ([place]) 10/31/17: Left Lumpectomy: IDC grade 2, 1.2 cm, 0/3 LN Neg, , ER 100% PR 2%, HER-2 negative ratio 0.78, Ki-67 20% T1c N0 Stage 1A  Treatment summary: 1. Oncotype DX16: 4% risk of recurrence with 5 years of tamoxifen: Low risk 2. Adjuvant radiation therapy2/20/2019 to 01/11/2018 3. Adjuvant antiestrogen therapy: With letrozole 2.5 mg  daily to started  01/28/2018 stopped within a few months because of hot flashes and depression  She could not tolerate Effexor because of nausea and vomiting. She does not want to take any further antiestrogen therapies  Breast cancer surveillance: 1.  Mammograms will be done November 2020 2.  Breast exam done 05/19/2019: Benign  Return to clinic in 1 year for follow-up.    No orders of the defined types were placed in this encounter.  The patient has a good understanding of the overall plan. she agrees with it. she will call with any problems that may develop before the next visit here.  Nicholas Lose, MD 08/06/2019  Julious Oka Dorshimer am acting as scribe for Dr. Nicholas Lose.  I have reviewed the above documentation for accuracy and completeness, and I agree with the above.

## 2019-08-06 ENCOUNTER — Encounter: Payer: Self-pay | Admitting: Gynecology

## 2019-08-06 ENCOUNTER — Inpatient Hospital Stay: Payer: Medicare Other | Attending: Hematology and Oncology | Admitting: Hematology and Oncology

## 2019-08-06 ENCOUNTER — Other Ambulatory Visit: Payer: Self-pay

## 2019-08-06 DIAGNOSIS — M791 Myalgia, unspecified site: Secondary | ICD-10-CM | POA: Diagnosis not present

## 2019-08-06 DIAGNOSIS — Z170 Estrogen receptor positive status [ER+]: Secondary | ICD-10-CM | POA: Diagnosis not present

## 2019-08-06 DIAGNOSIS — Z885 Allergy status to narcotic agent status: Secondary | ICD-10-CM | POA: Insufficient documentation

## 2019-08-06 DIAGNOSIS — Z923 Personal history of irradiation: Secondary | ICD-10-CM | POA: Insufficient documentation

## 2019-08-06 DIAGNOSIS — C50312 Malignant neoplasm of lower-inner quadrant of left female breast: Secondary | ICD-10-CM | POA: Diagnosis not present

## 2019-08-06 DIAGNOSIS — F329 Major depressive disorder, single episode, unspecified: Secondary | ICD-10-CM | POA: Diagnosis not present

## 2019-08-06 DIAGNOSIS — R112 Nausea with vomiting, unspecified: Secondary | ICD-10-CM | POA: Diagnosis not present

## 2019-08-06 DIAGNOSIS — Z881 Allergy status to other antibiotic agents status: Secondary | ICD-10-CM | POA: Diagnosis not present

## 2019-08-06 DIAGNOSIS — Z79811 Long term (current) use of aromatase inhibitors: Secondary | ICD-10-CM | POA: Diagnosis not present

## 2019-08-06 DIAGNOSIS — Z79899 Other long term (current) drug therapy: Secondary | ICD-10-CM | POA: Diagnosis not present

## 2019-08-06 DIAGNOSIS — R232 Flushing: Secondary | ICD-10-CM | POA: Insufficient documentation

## 2019-08-06 NOTE — Assessment & Plan Note (Signed)
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 Oncotype DX16: 4% risk of recurrence with 5 years of tamoxifen: Low risk 2. Adjuvant radiation therapy2/20/2019 to 01/11/2018 3. Adjuvant antiestrogen therapy: With letrozole 2.5 mg daily to started  01/28/2018 stopped within a few months because of hot flashes and depression  She could not tolerate Effexor because of nausea and vomiting. She does not want to take any further antiestrogen therapies  Breast cancer surveillance: 1.  Mammograms will be done November 2020 2.  Breast exam done 05/19/2019: Benign  Return to clinic in 1 year for follow-up.

## 2019-08-07 ENCOUNTER — Telehealth: Payer: Self-pay | Admitting: Hematology and Oncology

## 2019-08-07 NOTE — Telephone Encounter (Signed)
Scheduled appt per 10/7 los - mailed reminder letter with appt date and time

## 2019-09-05 ENCOUNTER — Telehealth: Payer: Self-pay

## 2019-09-05 NOTE — Telephone Encounter (Signed)
Pt reports rash on left breast that started on Tuesday.  Pt denies any fever, or feeling of warmth to breast.  Reports rash is not itching, not raised. Pt with h/o psoriasis. No changes in lotions, soaps, or detergents.    Pt has used OTC hydrocortisone cream with no effectiveness observed.  Pt has prescription psoriasis cream that she applied this AM.  RN encouraged continuation of cream over the next 2 days, along with Benadryl HS for 2 days.  RN encouraged patient to call back on 11/9 to update if any improvements, pt voiced agreement and understanding.

## 2019-09-09 ENCOUNTER — Telehealth: Payer: Self-pay

## 2019-09-09 NOTE — Telephone Encounter (Signed)
Per MD recommendations, patient should follow up with symptom management clinic regarding rash to breast.    RN left voicemail for patient to return call.

## 2019-09-09 NOTE — Telephone Encounter (Signed)
RN spoke with patient, patient agreeable to being evaluated by symptom management.  RN reviewed with Sandi Mealy, PA-C - okay for appointment on 11/11.   Pt scheduled for 11/11 @ 3:45p.  Pt will arrive early for registration.  No further needs.

## 2019-09-10 ENCOUNTER — Other Ambulatory Visit: Payer: Self-pay

## 2019-09-10 ENCOUNTER — Inpatient Hospital Stay: Payer: Medicare Other | Attending: Hematology and Oncology | Admitting: Medical

## 2019-09-10 VITALS — BP 121/63 | HR 71 | Temp 98.3°F | Resp 18 | Ht 64.0 in | Wt 142.8 lb

## 2019-09-10 DIAGNOSIS — Z881 Allergy status to other antibiotic agents status: Secondary | ICD-10-CM | POA: Insufficient documentation

## 2019-09-10 DIAGNOSIS — Z833 Family history of diabetes mellitus: Secondary | ICD-10-CM | POA: Diagnosis not present

## 2019-09-10 DIAGNOSIS — Z8262 Family history of osteoporosis: Secondary | ICD-10-CM | POA: Insufficient documentation

## 2019-09-10 DIAGNOSIS — Z87891 Personal history of nicotine dependence: Secondary | ICD-10-CM | POA: Insufficient documentation

## 2019-09-10 DIAGNOSIS — R232 Flushing: Secondary | ICD-10-CM | POA: Insufficient documentation

## 2019-09-10 DIAGNOSIS — Z79899 Other long term (current) drug therapy: Secondary | ICD-10-CM | POA: Insufficient documentation

## 2019-09-10 DIAGNOSIS — Z801 Family history of malignant neoplasm of trachea, bronchus and lung: Secondary | ICD-10-CM | POA: Diagnosis not present

## 2019-09-10 DIAGNOSIS — Z885 Allergy status to narcotic agent status: Secondary | ICD-10-CM | POA: Diagnosis not present

## 2019-09-10 DIAGNOSIS — C50312 Malignant neoplasm of lower-inner quadrant of left female breast: Secondary | ICD-10-CM

## 2019-09-10 DIAGNOSIS — F329 Major depressive disorder, single episode, unspecified: Secondary | ICD-10-CM | POA: Insufficient documentation

## 2019-09-10 DIAGNOSIS — Z8379 Family history of other diseases of the digestive system: Secondary | ICD-10-CM | POA: Insufficient documentation

## 2019-09-10 DIAGNOSIS — Z823 Family history of stroke: Secondary | ICD-10-CM | POA: Insufficient documentation

## 2019-09-10 DIAGNOSIS — B354 Tinea corporis: Secondary | ICD-10-CM

## 2019-09-10 DIAGNOSIS — Z800 Family history of malignant neoplasm of digestive organs: Secondary | ICD-10-CM | POA: Insufficient documentation

## 2019-09-10 DIAGNOSIS — R21 Rash and other nonspecific skin eruption: Secondary | ICD-10-CM

## 2019-09-10 DIAGNOSIS — Z170 Estrogen receptor positive status [ER+]: Secondary | ICD-10-CM

## 2019-09-10 DIAGNOSIS — Z806 Family history of leukemia: Secondary | ICD-10-CM | POA: Insufficient documentation

## 2019-09-10 DIAGNOSIS — K219 Gastro-esophageal reflux disease without esophagitis: Secondary | ICD-10-CM | POA: Insufficient documentation

## 2019-09-10 DIAGNOSIS — Z8249 Family history of ischemic heart disease and other diseases of the circulatory system: Secondary | ICD-10-CM | POA: Diagnosis not present

## 2019-09-10 DIAGNOSIS — M1990 Unspecified osteoarthritis, unspecified site: Secondary | ICD-10-CM | POA: Diagnosis not present

## 2019-09-10 DIAGNOSIS — Z803 Family history of malignant neoplasm of breast: Secondary | ICD-10-CM | POA: Insufficient documentation

## 2019-09-10 DIAGNOSIS — Z8041 Family history of malignant neoplasm of ovary: Secondary | ICD-10-CM | POA: Diagnosis not present

## 2019-09-10 MED ORDER — CLOTRIMAZOLE 1 % EX CREA: 1.0000 "application " | TOPICAL_CREAM | Freq: Two times a day (BID) | CUTANEOUS | 1 refills | Status: DC

## 2019-09-10 NOTE — Progress Notes (Signed)
Symptoms Management Clinic Progress Note   Cynthia Thornton [id number] 1945/12/22 73 y.o.  Cynthia Thornton is managed by Dr. Nicholas Lose  Actively treated with chemotherapy/immunotherapy/hormonal therapy: no  Next scheduled appointment with provider: 08/06/2020  Assessment: Plan:    Malignant neoplasm of lower-inner quadrant of left breast in female, estrogen receptor positive (HCC)  Rash  Tinea corporis - Plan: clotrimazole (CLOTRIMAZOLE ANTI-FUNGAL) 1 % cream   ER positive malignant neoplasm of the left breast: Cynthia Thornton continues to be managed by Dr. Nicholas Lose and is being managed with conservative follow-up.  She is scheduled to be seen in return on 08/06/2020.  Tinea corporis: A prescription for clotrimazole 1% cream was sent to her pharmacy with instructions to use twice daily.  Please see After Visit Summary for patient specific instructions.  Future Appointments  Date Time Provider [place]  10/09/2019  3:15 PM [facility] None  12/26/2019  9:00 AM Dennis Bast, RN [facility]  12/26/2019 10:00 AM Colon Branch, MD [facility]  01/02/2020  9:00 AM Fontaine, Belinda Block, MD [facility] Cynthia Thornton  04/16/2020  9:00 AM Renato Shin, MD [facility] None  08/06/2020 10:30 AM Nicholas Lose, MD [facility] None    No orders of the defined types were placed in this encounter.      Subjective:   Patient ID:  Cynthia Thornton is a 73 y.o. (DOB May 30, 1946) female.  Chief Complaint:  Chief Complaint  Patient presents with  . Rash    HPI Cynthia Thornton  Is a 73 y.o. female with a diagnosis of an ER positive malignant neoplasm of the left breast.  She is followed by Dr. [name] and was last seen on 08/06/2019.  She is status post a left lumpectomy which was completed on 11/02/2017.  This was followed by radiation therapy from 12/19/2017 through 01/09/2018.  She was begun on letrozole in April 2019 and continued this for 04/05/2018 when it was stopped because of  depression, hot flashes, and myalgias.  Cynthia Thornton contacted our office yesterday stating that she had noted a rash on left breast that started on Tuesday.    She denied fevers, increased warmth of her breast, or pruritus.  She has a history of psoriasis.  She has not had any changes in personal hygiene products.  She is use over-the-counter hydrocortisone cream without relief.  She was encouraged to continue the hydrocortisone cream and to add oral Benadryl twice daily.  Medications: I have reviewed the patient's current medications.  Allergies:  Allergies  Allergen Reactions  . Doxycycline Rash  . Oxycodone   . Codeine Nausea Only  . Propranolol Hcl Other (See Comments)    dizziness    Past Medical History:  Diagnosis Date  . Arthritis   . Cancer ([place]) 09/27/2017   left breast genetic screening panel 2017 with variant of unknown significance AXINA  . Esophageal reflux 08/15/2006   EGD(Chronic)  . Esophageal stricture 08/15/2006   EGD  . Family history of breast cancer   . Family history of colon cancer   . Hiatal hernia 08/15/2006   EGD  . Hyperthyroidism 2010   no meds now  . Insomnia   . Osteopenia 02/2018   T score -2.4 distal third of radius.  FRAX 11% / 1.8% stable at other points of interest to include spine, right and left hip  . Personal history of colonic polyps 09/06/2001   hyperplastic  . Pityriasis rosea   . Primary osteoarthritis of left  knee    Severe Patellofemoral arthritis  . 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 BREAST LUMPECTOMY WITH RADIOACTIVE SEED AND SENTINEL LYMPH NODE BIOPSY Left 11/02/2017   Procedure: LEFT BREAST LUMPECTOMY WITH RADIOACTIVE SEED AND LEFT AXILLARY DEEP SENTINEL LYMPH NODE BIOPSY, INJECT BLUE DYE LEFT BREAST;  Surgeon: [name], MD;  Location: [place];  Service: General;  Laterality: Left;  . BREAST SURGERY   2000   Breast cyst removed, left  . TONSILLECTOMY    . VAGINAL HYSTERECTOMY  1991   Right ovarian cystectomy  . VIDEO BRONCHOSCOPY Bilateral 03/16/2015   Procedure: VIDEO BRONCHOSCOPY WITHOUT FLUORO;  Surgeon: Brand Males, MD;  Location: [facility] END[facility]COPY;  Service: Endoscopy;  Laterality: Bilateral;    Family History  Problem Relation Age of Onset  . Osteoporosis Mother   . Lung cancer Mother   . Stroke Mother   . Diabetes Father   . Hypertension Father   . Osteoporosis Father   . Colon cancer Father 69  . Breast cancer Sister        Age 14  . Ovarian cancer Maternal Aunt 60  . Breast cancer Cousin        maternal-Age 39  . Colon cancer Paternal Uncle 48  . Colon cancer Cousin        maternal first cousin  . Breast cancer Maternal Aunt        great aunt- Age unknown  . Crohn's disease Son   . Leukemia Maternal Grandfather     Social History   Socioeconomic History  . Marital status: Divorced    Spouse name: Not on file  . Number of children: 2  . Years of education: Not on file  . Highest education level: Not on file  Occupational History  . Occupation: Pharmacist, hospital, works part time     Fish farm manager: [employer]  . Financial resource strain: Not on file  . Food insecurity    Worry: Not on file    Inability: Not on file  . Transportation needs    Medical: Not on file    Non-medical: Not on file  Tobacco Use  . Smoking status: Former Smoker    Packs/day: 0.10    Years: 30.00    Pack years: 3.00    Quit date: 10/31/1995    Years since quitting: 23.8  . Smokeless tobacco: Never Used  Substance and Sexual Activity  . Alcohol use: Yes    Alcohol/week: 0.0 standard drinks    Comment: occ  . Drug use: No  . Sexual activity: Not Currently    Birth control/protection: Surgical    Comment: 1st intercourse 73 yo-Fewer than 5 partners  Lifestyle  . Physical activity    Days per week: Not on file    Minutes per session: Not on file  . Stress: Not  on file  Relationships  . Social Herbalist on phone: Not on file    Gets together: Not on file    Attends religious service: Not on file    Active member of club or organization: Not on file    Attends meetings of clubs or organizations: Not on file    Relationship status: Not on file  . Intimate partner violence    Fear of current or ex partner: Not on file    Emotionally abused: Not on file  Physically abused: Not on file    Forced sexual activity: Not on file  Other Topics Concern  . Not on file  Social History Narrative   Lives by herself   2 children, one has mental issues     Past Medical History, Surgical history, Social history, and Family history were reviewed and updated as appropriate.   Please see review of systems for further details on the patient's review from today.   Review of Systems:  Review of Systems  Constitutional: Negative for chills, diaphoresis and fever.  HENT: Negative for facial swelling and trouble swallowing.   Respiratory: Negative for cough, chest tightness and shortness of breath.   Cardiovascular: Negative for chest pain.  Skin: Positive for rash.    Objective:   Physical Exam:  BP 121/63 (BP Location: Right Arm, Patient Position: Sitting)   Pulse 71   Temp 98.3 F (36.8 C) (Temporal)   Resp 18   Ht 5\' 4"  (1.626 m)   Wt 142 lb 12.8 oz (64.8 kg)   SpO2 100%   BMI 24.51 kg/m  ECOG: 0  Physical Exam Constitutional:      General: She is not in acute distress.    Appearance: Normal appearance. She is not ill-appearing, toxic-appearing or diaphoretic.  HENT:     Head: Normocephalic and atraumatic.  Skin:    General: Skin is warm and dry.     Findings: Rash present.     Comments: Several 1.5 x 1 cm ovoid erythematous lesions were noted.  Each of these had slightly raised borders with clearing to slightly scaling areas in the center.  No exudate was noted.  Neurological:     Mental Status: She is alert.      Coordination: Coordination normal.     Gait: Gait normal.  Psychiatric:        Mood and Affect: Mood normal.        Behavior: Behavior normal.        Thought Content: Thought content normal.        Judgment: Judgment normal.     Lab Review:     Component Value Date/Time   NA 136 12/20/2018 1405   NA 134 (L) 10/03/2017 0837   K 4.4 12/20/2018 1405   K 4.3 10/03/2017 0837   CL 101 12/20/2018 1405   CO2 29 12/20/2018 1405   CO2 23 10/03/2017 0837   GLUCOSE 117 (H) 12/20/2018 1405   GLUCOSE 90 10/03/2017 0837   BUN 18 12/20/2018 1405   BUN 24.3 10/03/2017 0837   CREATININE 0.93 12/20/2018 1405   CREATININE 1.0 10/03/2017 0837   CALCIUM 9.1 12/20/2018 1405   CALCIUM 9.7 10/03/2017 0837   PROT 6.5 12/16/2018 1038   PROT 7.7 10/03/2017 0837   ALBUMIN 4.5 12/16/2018 1038   ALBUMIN 4.7 10/03/2017 0837   AST 14 12/16/2018 1038   AST 21 10/03/2017 0837   ALT 13 12/16/2018 1038   ALT 17 10/03/2017 0837   ALKPHOS 90 12/16/2018 1038   ALKPHOS 107 10/03/2017 0837   BILITOT 0.5 12/16/2018 1038   BILITOT 0.44 10/03/2017 0837   GFRNONAA 46 (L) 03/04/2016 0251   GFRAA 54 (L) 03/04/2016 0251       Component Value Date/Time   WBC 6.3 12/16/2018 1038   RBC 4.19 12/16/2018 1038   HGB 12.8 12/16/2018 1038   HGB 13.0 10/03/2017 0837   HCT 37.6 12/16/2018 1038   HCT 37.6 10/03/2017 0837   PLT 293.0 12/16/2018 1038   PLT 305  10/03/2017 0837   MCV 89.7 12/16/2018 1038   MCV 88.3 10/03/2017 0837   MCH 30.5 10/03/2017 0837   MCH 27.8 12/08/2016 1537   MCHC 34.1 12/16/2018 1038   RDW 13.4 12/16/2018 1038   RDW 13.4 10/03/2017 0837   LYMPHSABS 1.4 12/16/2018 1038   LYMPHSABS 1.5 10/03/2017 0837   MONOABS 0.6 12/16/2018 1038   MONOABS 0.5 10/03/2017 0837   EOSABS 0.2 12/16/2018 1038   EOSABS 0.2 10/03/2017 0837   BASOSABS 0.0 12/16/2018 1038   BASOSABS 0.1 10/03/2017 0837   -------------------------------  Imaging from last 24 hours (if applicable):  Radiology interpretation:  No results found.

## 2019-09-10 NOTE — Patient Instructions (Signed)
COVID-19: How to Protect Yourself and Others Know how it spreads  There is currently no vaccine to prevent coronavirus disease 2019 (COVID-19).  The best way to prevent illness is to avoid being exposed to this virus.  The virus is thought to spread mainly from person-to-person. ? Between people who are in close contact with one another (within about 6 feet). ? Through respiratory droplets produced when an infected person coughs, sneezes or talks. ? These droplets can land in the mouths or noses of people who are nearby or possibly be inhaled into the lungs. ? Some recent studies have suggested that COVID-19 may be spread by people who are not showing symptoms. Everyone should Clean your hands often  Wash your hands often with soap and water for at least 20 seconds especially after you have been in a public place, or after blowing your nose, coughing, or sneezing.  If soap and water are not readily available, use a hand sanitizer that contains at least 60% alcohol. Cover all surfaces of your hands and rub them together until they feel dry.  Avoid touching your eyes, nose, and mouth with unwashed hands. Avoid close contact  Stay home if you are sick.  Avoid close contact with people who are sick.  Put distance between yourself and other people. ? Remember that some people without symptoms may be able to spread virus. ? This is especially important for people who are at higher risk of getting very sick.www.cdc.gov/coronavirus/2019-ncov/need-extra-precautions/people-at-higher-risk.html Cover your mouth and nose with a cloth face cover when around others  You could spread COVID-19 to others even if you do not feel sick.  Everyone should wear a cloth face cover when they have to go out in public, for example to the grocery store or to pick up other necessities. ? Cloth face coverings should not be placed on young children under age 2, anyone who has trouble breathing, or is unconscious,  incapacitated or otherwise unable to remove the mask without assistance.  The cloth face cover is meant to protect other people in case you are infected.  Do NOT use a facemask meant for a healthcare worker.  Continue to keep about 6 feet between yourself and others. The cloth face cover is not a substitute for social distancing. Cover coughs and sneezes  If you are in a private setting and do not have on your cloth face covering, remember to always cover your mouth and nose with a tissue when you cough or sneeze or use the inside of your elbow.  Throw used tissues in the trash.  Immediately wash your hands with soap and water for at least 20 seconds. If soap and water are not readily available, clean your hands with a hand sanitizer that contains at least 60% alcohol. Clean and disinfect  Clean AND disinfect frequently touched surfaces daily. This includes tables, doorknobs, light switches, countertops, handles, desks, phones, keyboards, toilets, faucets, and sinks. www.cdc.gov/coronavirus/2019-ncov/prevent-getting-sick/disinfecting-your-home.html  If surfaces are dirty, clean them: Use detergent or soap and water prior to disinfection.  Then, use a household disinfectant. You can see a list of EPA-registered household disinfectants here. cdc.gov/coronavirus 03/04/2019 This information is not intended to replace advice given to you by your health care provider. Make sure you discuss any questions you have with your health care provider. Document Released: 02/11/2019 Document Revised: 03/12/2019 Document Reviewed: 02/11/2019 Elsevier Patient Education  2020 Elsevier Inc.  

## 2019-09-12 ENCOUNTER — Encounter: Payer: Self-pay | Admitting: Internal Medicine

## 2019-09-18 LAB — HM MAMMOGRAPHY

## 2019-10-08 ENCOUNTER — Other Ambulatory Visit: Payer: Self-pay

## 2019-10-08 ENCOUNTER — Encounter (HOSPITAL_BASED_OUTPATIENT_CLINIC_OR_DEPARTMENT_OTHER): Payer: Self-pay | Admitting: *Deleted

## 2019-10-08 NOTE — Progress Notes (Signed)
Spoke w/ via phone for pre-op interview--- PT Lab needs dos----  none             Lab results------ getting CBC, T&S done 10-10-2019 @ 0900;  CXR dated 05-30-2019  COVID test ------ 10-09-2019 @1515  Arrive at ------- 0530 NPO after ------ Mn Medications to take morning of surgery ----- Zyrtec, Prilosec w/ sips of water Diabetic medication ----- n/a Patient Special Instructions ----- n/a Pre-Op special Istructions ----- n/a  Patient verbalized understanding of instructions that were given at this phone interview. Patient denies shortness of breath, chest pain, fever, cough a this phone interview.   Anesthesia:   PCP:   Dr Kathlene November Endocrinologist:  Dr Loanne Drilling (lov 04-11-2019 epic) for multinodular thyroid, hx hyperthroidism Cardiology/ Vascular :  Dr Darnell Level. Andree [facility] in [place], [place] 12-13-2018 care everywhere) for dyslipidemia and family hx stroke  Chest x-ray : 05-30-2019 epic EKG : 12-13-2018  Care everywhere Echo : no Cardiac Cath : no Sleep Study/ CPAP : NO  Blood Thinner/ Instructions Maryjane Hurter Dose:  NO ASA / Instructions/ Last Dose : ASA 81mg /    Patient denies shortness of breath, chest pain, fever, and cough at this phone interview.

## 2019-10-09 ENCOUNTER — Other Ambulatory Visit (HOSPITAL_COMMUNITY)
Admission: RE | Admit: 2019-10-09 | Discharge: 2019-10-09 | Disposition: A | Discharge status: Home / Self Care | Payer: Medicare Other | Source: Ambulatory Visit | Attending: Obstetrics & Gynecology | Admitting: Obstetrics & Gynecology

## 2019-10-09 DIAGNOSIS — Z01812 Encounter for preprocedural laboratory examination: Secondary | ICD-10-CM | POA: Insufficient documentation

## 2019-10-09 DIAGNOSIS — Z20828 Contact with and (suspected) exposure to other viral communicable diseases: Secondary | ICD-10-CM | POA: Insufficient documentation

## 2019-10-10 ENCOUNTER — Encounter (HOSPITAL_COMMUNITY)
Admission: RE | Admit: 2019-10-10 | Discharge: 2019-10-10 | Disposition: A | Discharge status: Home / Self Care | Payer: Medicare Other | Source: Ambulatory Visit | Attending: Obstetrics & Gynecology | Admitting: Obstetrics & Gynecology

## 2019-10-10 ENCOUNTER — Other Ambulatory Visit: Payer: Self-pay

## 2019-10-10 DIAGNOSIS — Z923 Personal history of irradiation: Secondary | ICD-10-CM | POA: Diagnosis not present

## 2019-10-10 DIAGNOSIS — K449 Diaphragmatic hernia without obstruction or gangrene: Secondary | ICD-10-CM | POA: Diagnosis not present

## 2019-10-10 DIAGNOSIS — Z800 Family history of malignant neoplasm of digestive organs: Secondary | ICD-10-CM | POA: Diagnosis not present

## 2019-10-10 DIAGNOSIS — Z881 Allergy status to other antibiotic agents status: Secondary | ICD-10-CM | POA: Diagnosis not present

## 2019-10-10 DIAGNOSIS — Z853 Personal history of malignant neoplasm of breast: Secondary | ICD-10-CM | POA: Diagnosis not present

## 2019-10-10 DIAGNOSIS — K219 Gastro-esophageal reflux disease without esophagitis: Secondary | ICD-10-CM | POA: Diagnosis not present

## 2019-10-10 DIAGNOSIS — Z4002 Encounter for prophylactic removal of ovary: Secondary | ICD-10-CM | POA: Diagnosis present

## 2019-10-10 DIAGNOSIS — M1712 Unilateral primary osteoarthritis, left knee: Secondary | ICD-10-CM | POA: Diagnosis not present

## 2019-10-10 DIAGNOSIS — Z79899 Other long term (current) drug therapy: Secondary | ICD-10-CM | POA: Diagnosis not present

## 2019-10-10 DIAGNOSIS — Z885 Allergy status to narcotic agent status: Secondary | ICD-10-CM | POA: Diagnosis not present

## 2019-10-10 DIAGNOSIS — Z7982 Long term (current) use of aspirin: Secondary | ICD-10-CM | POA: Diagnosis not present

## 2019-10-10 DIAGNOSIS — E0590 Thyrotoxicosis, unspecified without thyrotoxic crisis or storm: Secondary | ICD-10-CM | POA: Diagnosis not present

## 2019-10-10 DIAGNOSIS — Z888 Allergy status to other drugs, medicaments and biological substances status: Secondary | ICD-10-CM | POA: Diagnosis not present

## 2019-10-10 DIAGNOSIS — Z90710 Acquired absence of both cervix and uterus: Secondary | ICD-10-CM | POA: Diagnosis not present

## 2019-10-10 DIAGNOSIS — Z8041 Family history of malignant neoplasm of ovary: Secondary | ICD-10-CM | POA: Diagnosis not present

## 2019-10-10 DIAGNOSIS — Z0183 Encounter for blood typing: Secondary | ICD-10-CM | POA: Diagnosis not present

## 2019-10-10 DIAGNOSIS — Z8719 Personal history of other diseases of the digestive system: Secondary | ICD-10-CM | POA: Diagnosis not present

## 2019-10-10 DIAGNOSIS — Z803 Family history of malignant neoplasm of breast: Secondary | ICD-10-CM | POA: Diagnosis not present

## 2019-10-10 DIAGNOSIS — M8589 Other specified disorders of bone density and structure, multiple sites: Secondary | ICD-10-CM | POA: Diagnosis not present

## 2019-10-10 DIAGNOSIS — Z87891 Personal history of nicotine dependence: Secondary | ICD-10-CM | POA: Diagnosis not present

## 2019-10-10 DIAGNOSIS — J449 Chronic obstructive pulmonary disease, unspecified: Secondary | ICD-10-CM | POA: Diagnosis not present

## 2019-10-10 LAB — CBC
HCT: 36.2 % (ref 36.0–46.0)
Hemoglobin: 12.4 g/dL (ref 12.0–15.0)
MCH: 30.6 pg (ref 26.0–34.0)
MCHC: 34.3 g/dL (ref 30.0–36.0)
MCV: 89.4 fL (ref 80.0–100.0)
Platelets: 287 10*3/uL (ref 150–400)
RBC: 4.05 MIL/uL (ref 3.87–5.11)
RDW: 12.6 % (ref 11.5–15.5)
WBC: 5.5 10*3/uL (ref 4.0–10.5)
nRBC: 0 % (ref 0.0–0.2)

## 2019-10-10 LAB — ABO/RH: ABO/RH(D): O POS

## 2019-10-10 LAB — NOVEL CORONAVIRUS, NAA (HOSP ORDER, SEND-OUT TO REF LAB; TAT 18-24 HRS): SARS-CoV-2, NAA: NOT DETECTED

## 2019-10-13 ENCOUNTER — Encounter (HOSPITAL_BASED_OUTPATIENT_CLINIC_OR_DEPARTMENT_OTHER)
Admission: RE | Disposition: A | Discharge status: Home / Self Care | Payer: Self-pay | Source: Home / Self Care | Attending: Obstetrics & Gynecology

## 2019-10-13 ENCOUNTER — Other Ambulatory Visit: Payer: Self-pay

## 2019-10-13 ENCOUNTER — Ambulatory Visit (HOSPITAL_BASED_OUTPATIENT_CLINIC_OR_DEPARTMENT_OTHER): Payer: Medicare Other | Admitting: Anesthesiology

## 2019-10-13 ENCOUNTER — Encounter (HOSPITAL_BASED_OUTPATIENT_CLINIC_OR_DEPARTMENT_OTHER): Payer: Self-pay | Admitting: Obstetrics & Gynecology

## 2019-10-13 ENCOUNTER — Ambulatory Visit (HOSPITAL_BASED_OUTPATIENT_CLINIC_OR_DEPARTMENT_OTHER)
Admission: RE | Admit: 2019-10-13 | Discharge: 2019-10-13 | Disposition: A | Discharge status: Home / Self Care | Payer: Medicare Other | Attending: Obstetrics & Gynecology | Admitting: Obstetrics & Gynecology

## 2019-10-13 DIAGNOSIS — K449 Diaphragmatic hernia without obstruction or gangrene: Secondary | ICD-10-CM | POA: Insufficient documentation

## 2019-10-13 DIAGNOSIS — Z885 Allergy status to narcotic agent status: Secondary | ICD-10-CM | POA: Insufficient documentation

## 2019-10-13 DIAGNOSIS — J449 Chronic obstructive pulmonary disease, unspecified: Secondary | ICD-10-CM | POA: Insufficient documentation

## 2019-10-13 DIAGNOSIS — M1712 Unilateral primary osteoarthritis, left knee: Secondary | ICD-10-CM | POA: Insufficient documentation

## 2019-10-13 DIAGNOSIS — Z8719 Personal history of other diseases of the digestive system: Secondary | ICD-10-CM | POA: Insufficient documentation

## 2019-10-13 DIAGNOSIS — Z7982 Long term (current) use of aspirin: Secondary | ICD-10-CM | POA: Insufficient documentation

## 2019-10-13 DIAGNOSIS — Z803 Family history of malignant neoplasm of breast: Secondary | ICD-10-CM | POA: Insufficient documentation

## 2019-10-13 DIAGNOSIS — Z853 Personal history of malignant neoplasm of breast: Secondary | ICD-10-CM | POA: Insufficient documentation

## 2019-10-13 DIAGNOSIS — K219 Gastro-esophageal reflux disease without esophagitis: Secondary | ICD-10-CM | POA: Insufficient documentation

## 2019-10-13 DIAGNOSIS — Z8041 Family history of malignant neoplasm of ovary: Secondary | ICD-10-CM

## 2019-10-13 DIAGNOSIS — Z888 Allergy status to other drugs, medicaments and biological substances status: Secondary | ICD-10-CM | POA: Insufficient documentation

## 2019-10-13 DIAGNOSIS — E0590 Thyrotoxicosis, unspecified without thyrotoxic crisis or storm: Secondary | ICD-10-CM | POA: Insufficient documentation

## 2019-10-13 DIAGNOSIS — K660 Peritoneal adhesions (postprocedural) (postinfection): Secondary | ICD-10-CM

## 2019-10-13 DIAGNOSIS — Z800 Family history of malignant neoplasm of digestive organs: Secondary | ICD-10-CM | POA: Insufficient documentation

## 2019-10-13 DIAGNOSIS — Z881 Allergy status to other antibiotic agents status: Secondary | ICD-10-CM | POA: Insufficient documentation

## 2019-10-13 DIAGNOSIS — Z4002 Encounter for prophylactic removal of ovary: Secondary | ICD-10-CM | POA: Diagnosis not present

## 2019-10-13 DIAGNOSIS — Z79899 Other long term (current) drug therapy: Secondary | ICD-10-CM | POA: Insufficient documentation

## 2019-10-13 DIAGNOSIS — M8589 Other specified disorders of bone density and structure, multiple sites: Secondary | ICD-10-CM | POA: Insufficient documentation

## 2019-10-13 DIAGNOSIS — Z923 Personal history of irradiation: Secondary | ICD-10-CM | POA: Insufficient documentation

## 2019-10-13 DIAGNOSIS — Z87891 Personal history of nicotine dependence: Secondary | ICD-10-CM | POA: Insufficient documentation

## 2019-10-13 DIAGNOSIS — Z0183 Encounter for blood typing: Secondary | ICD-10-CM | POA: Insufficient documentation

## 2019-10-13 DIAGNOSIS — Z90710 Acquired absence of both cervix and uterus: Secondary | ICD-10-CM | POA: Insufficient documentation

## 2019-10-13 HISTORY — DX: Personal history of other endocrine, nutritional and metabolic disease: Z86.39

## 2019-10-13 HISTORY — PX: LAPAROSCOPIC BILATERAL SALPINGO OOPHERECTOMY: SHX5890

## 2019-10-13 HISTORY — DX: Presence of spectacles and contact lenses: Z97.3

## 2019-10-13 HISTORY — DX: Malignant neoplasm of unspecified site of left female breast: C50.912

## 2019-10-13 HISTORY — DX: Hyperlipidemia, unspecified: E78.5

## 2019-10-13 HISTORY — DX: Chronic obstructive pulmonary disease, unspecified: J44.9

## 2019-10-13 HISTORY — DX: Dermatitis, unspecified: L30.9

## 2019-10-13 HISTORY — DX: Personal history of irradiation: Z92.3

## 2019-10-13 HISTORY — DX: Personal history of other diseases of the digestive system: Z87.19

## 2019-10-13 HISTORY — DX: Gastro-esophageal reflux disease without esophagitis: K21.9

## 2019-10-13 HISTORY — DX: Nontoxic multinodular goiter: E04.2

## 2019-10-13 LAB — TYPE AND SCREEN
ABO/RH(D): O POS
Antibody Screen: NEGATIVE

## 2019-10-13 SURGERY — SALPINGO-OOPHORECTOMY, BILATERAL, LAPAROSCOPIC
Anesthesia: General | Laterality: Bilateral

## 2019-10-13 MED ORDER — TRAMADOL HCL 50 MG PO TABS
50.0000 mg | ORAL_TABLET | Freq: Once | ORAL | Status: AC | PRN
  Administered 2019-10-13: 12:00:00 50 mg via ORAL
  Filled 2019-10-13: qty 1

## 2019-10-13 MED ORDER — SCOPOLAMINE 1 MG/3DAYS TD PT72
MEDICATED_PATCH | TRANSDERMAL | Status: AC
  Filled 2019-10-13: qty 1

## 2019-10-13 MED ORDER — ACETAMINOPHEN 325 MG PO TABS
ORAL_TABLET | ORAL | Status: DC | PRN
  Administered 2019-10-13: 1000 mg via ORAL

## 2019-10-13 MED ORDER — LIDOCAINE 2% (20 MG/ML) 5 ML SYRINGE
INTRAMUSCULAR | Status: AC
  Filled 2019-10-13: qty 5

## 2019-10-13 MED ORDER — ROCURONIUM BROMIDE 50 MG/5ML IV SOSY
PREFILLED_SYRINGE | INTRAVENOUS | Status: DC | PRN
  Administered 2019-10-13: 50 mg via INTRAVENOUS

## 2019-10-13 MED ORDER — FENTANYL CITRATE (PF) 100 MCG/2ML IJ SOLN
25.0000 ug | INTRAMUSCULAR | Status: DC | PRN
  Administered 2019-10-13 (×2): 25 ug via INTRAVENOUS
  Filled 2019-10-13: qty 1

## 2019-10-13 MED ORDER — PROPOFOL 500 MG/50ML IV EMUL
INTRAVENOUS | Status: DC | PRN
  Administered 2019-10-13: 150 ug/kg/min via INTRAVENOUS
  Administered 2019-10-13: 130 ug/kg/min via INTRAVENOUS

## 2019-10-13 MED ORDER — KETOROLAC TROMETHAMINE 30 MG/ML IJ SOLN
INTRAMUSCULAR | Status: AC
  Filled 2019-10-13: qty 1

## 2019-10-13 MED ORDER — ROCURONIUM BROMIDE 10 MG/ML (PF) SYRINGE
PREFILLED_SYRINGE | INTRAVENOUS | Status: AC
  Filled 2019-10-13: qty 10

## 2019-10-13 MED ORDER — CEFAZOLIN SODIUM-DEXTROSE 2-4 GM/100ML-% IV SOLN
2.0000 g | INTRAVENOUS | Status: AC
  Administered 2019-10-13: 2 g via INTRAVENOUS
  Filled 2019-10-13: qty 100

## 2019-10-13 MED ORDER — ONDANSETRON HCL 4 MG/2ML IJ SOLN
INTRAMUSCULAR | Status: AC
  Filled 2019-10-13: qty 2

## 2019-10-13 MED ORDER — LIDOCAINE 2% (20 MG/ML) 5 ML SYRINGE
INTRAMUSCULAR | Status: DC | PRN
  Administered 2019-10-13: 60 mg via INTRAVENOUS

## 2019-10-13 MED ORDER — FENTANYL CITRATE (PF) 100 MCG/2ML IJ SOLN
INTRAMUSCULAR | Status: AC
  Filled 2019-10-13: qty 2

## 2019-10-13 MED ORDER — PROPOFOL 10 MG/ML IV BOLUS
INTRAVENOUS | Status: AC
  Filled 2019-10-13: qty 40

## 2019-10-13 MED ORDER — ONDANSETRON HCL 4 MG/2ML IJ SOLN
INTRAMUSCULAR | Status: DC | PRN
  Administered 2019-10-13: 4 mg via INTRAVENOUS

## 2019-10-13 MED ORDER — TRAMADOL HCL 50 MG PO TABS: 50.0000 mg | ORAL_TABLET | Freq: Four times a day (QID) | ORAL | 0 refills | Status: DC | PRN

## 2019-10-13 MED ORDER — LACTATED RINGERS IV SOLN
INTRAVENOUS | Status: DC
  Administered 2019-10-13: 11:00:00 via INTRAVENOUS
  Filled 2019-10-13: qty 1000

## 2019-10-13 MED ORDER — KETOROLAC TROMETHAMINE 30 MG/ML IJ SOLN
INTRAMUSCULAR | Status: DC | PRN
  Administered 2019-10-13: 15 mg via INTRAVENOUS

## 2019-10-13 MED ORDER — FENTANYL CITRATE (PF) 100 MCG/2ML IJ SOLN
INTRAMUSCULAR | Status: DC | PRN
  Administered 2019-10-13 (×2): 50 ug via INTRAVENOUS

## 2019-10-13 MED ORDER — TRAMADOL HCL 50 MG PO TABS
ORAL_TABLET | ORAL | Status: AC
  Filled 2019-10-13: qty 1

## 2019-10-13 MED ORDER — CEFAZOLIN SODIUM-DEXTROSE 2-4 GM/100ML-% IV SOLN
INTRAVENOUS | Status: AC
  Filled 2019-10-13: qty 100

## 2019-10-13 MED ORDER — ACETAMINOPHEN 500 MG PO TABS
ORAL_TABLET | ORAL | Status: AC
  Filled 2019-10-13: qty 2

## 2019-10-13 MED ORDER — BUPIVACAINE HCL (PF) 0.25 % IJ SOLN
INTRAMUSCULAR | Status: DC | PRN
  Administered 2019-10-13: 10 mL

## 2019-10-13 MED ORDER — DEXAMETHASONE SODIUM PHOSPHATE 10 MG/ML IJ SOLN
INTRAMUSCULAR | Status: AC
  Filled 2019-10-13: qty 1

## 2019-10-13 MED ORDER — LACTATED RINGERS IV SOLN
INTRAVENOUS | Status: DC
  Administered 2019-10-13: 50 mL/h via INTRAVENOUS
  Filled 2019-10-13: qty 1000

## 2019-10-13 MED ORDER — SCOPOLAMINE 1 MG/3DAYS TD PT72
MEDICATED_PATCH | TRANSDERMAL | Status: DC | PRN
  Administered 2019-10-13: 1 via TRANSDERMAL

## 2019-10-13 MED ORDER — DEXAMETHASONE SODIUM PHOSPHATE 10 MG/ML IJ SOLN
INTRAMUSCULAR | Status: DC | PRN
  Administered 2019-10-13: 10 mg via INTRAVENOUS

## 2019-10-13 MED ORDER — ONDANSETRON HCL 4 MG/2ML IJ SOLN
4.0000 mg | Freq: Once | INTRAMUSCULAR | Status: DC | PRN
  Filled 2019-10-13: qty 2

## 2019-10-13 MED ORDER — SUGAMMADEX SODIUM 200 MG/2ML IV SOLN
INTRAVENOUS | Status: DC | PRN
  Administered 2019-10-13: 130 mg via INTRAVENOUS

## 2019-10-13 SURGICAL SUPPLY — 58 items
ADH SKN CLS APL DERMABOND .7 (GAUZE/BANDAGES/DRESSINGS) ×1
APL SRG 38 LTWT LNG FL B (MISCELLANEOUS)
APPLICATOR ARISTA FLEXITIP XL (MISCELLANEOUS) IMPLANT
BAG LAPAROSCOPIC 12 15 PORT 16 (BASKET) IMPLANT
BAG RETRIEVAL 12/15 (BASKET)
BARRIER ADHS 3X4 INTERCEED (GAUZE/BANDAGES/DRESSINGS) IMPLANT
BRR ADH 4X3 ABS CNTRL BYND (GAUZE/BANDAGES/DRESSINGS)
CABLE HIGH FREQUENCY MONO STRZ (ELECTRODE) IMPLANT
CATH ROBINSON RED A/P 16FR (CATHETERS) IMPLANT
CLIP FILSHIE TUBAL LIGA STRL (Clip) IMPLANT
CONT SPEC 4OZ CLIKSEAL STRL BL (MISCELLANEOUS) ×2 IMPLANT
COVER MAYO STAND STRL (DRAPES) ×2 IMPLANT
COVER WAND RF STERILE (DRAPES) ×2 IMPLANT
DERMABOND ADVANCED (GAUZE/BANDAGES/DRESSINGS) ×1
DERMABOND ADVANCED .7 DNX12 (GAUZE/BANDAGES/DRESSINGS) ×1 IMPLANT
DRSG COVADERM PLUS 2X2 (GAUZE/BANDAGES/DRESSINGS) IMPLANT
DRSG OPSITE POSTOP 3X4 (GAUZE/BANDAGES/DRESSINGS) ×1 IMPLANT
DURAPREP 26ML APPLICATOR (WOUND CARE) ×2 IMPLANT
FILSHE IMPLANT
GAUZE 4X4 16PLY RFD (DISPOSABLE) ×2 IMPLANT
GLOVE BIO SURGEON STRL SZ 6.5 (GLOVE) ×2 IMPLANT
GLOVE BIO SURGEON STRL SZ7.5 (GLOVE) IMPLANT
GLOVE BIOGEL PI IND STRL 7.0 (GLOVE) ×2 IMPLANT
GLOVE BIOGEL PI INDICATOR 7.0 (GLOVE) ×2
GOWN STRL REUS W/TWL LRG LVL3 (GOWN DISPOSABLE) ×4 IMPLANT
GOWN STRL REUS W/TWL XL LVL3 (GOWN DISPOSABLE) IMPLANT
HEMOSTAT ARISTA ABSORB 3G PWDR (HEMOSTASIS) IMPLANT
MANIPULATOR UTERINE 4.5 ZUMI (MISCELLANEOUS) IMPLANT
NDL SAFETY ECLIPSE 18X1.5 (NEEDLE) IMPLANT
NEEDLE HYPO 18GX1.5 SHARP (NEEDLE)
PACK LAPAROSCOPY BASIN (CUSTOM PROCEDURE TRAY) ×2 IMPLANT
PACK TRENDGUARD 450 HYBRID PRO (MISCELLANEOUS) ×1 IMPLANT
PAD OB MATERNITY 4.3X12.25 (PERSONAL CARE ITEMS) ×2 IMPLANT
PROTECTOR NERVE ULNAR (MISCELLANEOUS) ×4 IMPLANT
SCISSORS LAP 5X35 DISP (ENDOMECHANICALS) IMPLANT
SET IRRIG TUBING LAPAROSCOPIC (IRRIGATION / IRRIGATOR) ×2 IMPLANT
SET TUBE SMOKE EVAC HIGH FLOW (TUBING) ×2 IMPLANT
SHEARS HARMONIC ACE PLUS 36CM (ENDOMECHANICALS) ×2 IMPLANT
SOLUTION ELECTROLUBE (MISCELLANEOUS) IMPLANT
SUT MNCRL AB 4-0 PS2 18 (SUTURE) ×4 IMPLANT
SUT VICRYL 0 UR6 27IN ABS (SUTURE) ×2 IMPLANT
SYR 50ML LL SCALE MARK (SYRINGE) IMPLANT
SYS BAG RETRIEVAL 10MM (BASKET)
SYS RETRIEVAL 5MM INZII UNIV (BASKET) ×2
SYSTEM BAG RETRIEVAL 10MM (BASKET) IMPLANT
SYSTEM RETRIEVL 5MM INZII UNIV (BASKET) IMPLANT
TOWEL OR 17X26 10 PK STRL BLUE (TOWEL DISPOSABLE) ×4 IMPLANT
TRAP SPECIMEN MUCOUS 40CC (MISCELLANEOUS) ×1 IMPLANT
TRAY FOLEY W/BAG SLVR 14FR (SET/KITS/TRAYS/PACK) ×2 IMPLANT
TRENDGUARD 450 HYBRID PRO PACK (MISCELLANEOUS) ×2
TROCAR 12M 150ML BLUNT (TROCAR) IMPLANT
TROCAR BALLN 12MMX100 BLUNT (TROCAR) ×2 IMPLANT
TROCAR BLADELESS 15MM (ENDOMECHANICALS) IMPLANT
TROCAR BLADELESS OPT 5 100 (ENDOMECHANICALS) ×4 IMPLANT
TROCAR XCEL 12X100 BLDLESS (ENDOMECHANICALS) IMPLANT
TROCAR XCEL DIL TIP R 11M (ENDOMECHANICALS) IMPLANT
TROCAR XCEL NON-BLD 11X100MML (ENDOMECHANICALS) IMPLANT
WARMER LAPAROSCOPE (MISCELLANEOUS) ×2 IMPLANT

## 2019-10-13 NOTE — Anesthesia Preprocedure Evaluation (Addendum)
Anesthesia Evaluation  Patient identified by MRN, date of birth, ID band Patient awake    Reviewed: Allergy & Precautions, NPO status , Patient's Chart, lab work & pertinent test results  History of Anesthesia Complications (+) PONV  Airway Mallampati: II  TM Distance: >3 FB Neck ROM: Full    Dental  (+) Teeth Intact   Pulmonary COPD, former smoker,    Pulmonary exam normal        Cardiovascular negative cardio ROS Normal cardiovascular exam     Neuro/Psych PSYCHIATRIC DISORDERS Anxiety    GI/Hepatic Neg liver ROS, hiatal hernia, GERD  Medicated,  Endo/Other  Hyperthyroidism   Renal/GU negative Renal ROS  negative genitourinary   Musculoskeletal  (+) Arthritis ,   Abdominal   Peds  Hematology negative hematology ROS (+)   Anesthesia Other Findings Breast cancer s/p lumpectomy/radiation  Reproductive/Obstetrics                            Anesthesia Physical Anesthesia Plan  ASA: II  Anesthesia Plan: General   Post-op Pain Management:    Induction: Intravenous  PONV Risk Score and Plan: 4 or greater and Ondansetron, Dexamethasone, Treatment may vary due to age or medical condition, Midazolam, TIVA and Propofol infusion  Airway Management Planned: Oral ETT  Additional Equipment: None  Intra-op Plan:   Post-operative Plan: Extubation in OR  Informed Consent: I have reviewed the patients History and Physical, chart, labs and discussed the procedure including the risks, benefits and alternatives for the proposed anesthesia with the patient or authorized representative who has indicated his/her understanding and acceptance.     Dental advisory given  Plan Discussed with:   Anesthesia Plan Comments:        Anesthesia Quick Evaluation

## 2019-10-13 NOTE — Anesthesia Postprocedure Evaluation (Signed)
Anesthesia Post Note  Patient: Cynthia Thornton  Procedure(s) Performed: LAPAROSCOPIC BILATERAL SALPINGO OOPHORECTOMY LYSIS OF ADHESIONS PERITONEAL WASHINGS  (Bilateral )     Patient location during evaluation: PACU Anesthesia Type: General Level of consciousness: awake and alert Pain management: pain level controlled Vital Signs Assessment: post-procedure vital signs reviewed and stable Respiratory status: spontaneous breathing, nonlabored ventilation and respiratory function stable Cardiovascular status: blood pressure returned to baseline and stable Postop Assessment: no apparent nausea or vomiting Anesthetic complications: no    Last Vitals:  Vitals:   10/13/19 1115 10/13/19 1230  BP:  111/66  Pulse: 77 64  Resp:  16  Temp:    SpO2: 100% 100%    Last Pain:  Vitals:   10/13/19 1100  TempSrc:   PainSc: 5                  Lidia Collum

## 2019-10-13 NOTE — Transfer of Care (Signed)
Immediate Anesthesia Transfer of Care Note  Patient: Cynthia Thornton  Procedure(s) Performed: LAPAROSCOPIC BILATERAL SALPINGO OOPHORECTOMY LYSIS OF ADHESIONS PERITONEAL WASHINGS  (Bilateral )  Patient Location: PACU  Anesthesia Type:General  Level of Consciousness: drowsy  Airway & Oxygen Therapy: Patient Spontanous Breathing and Patient connected to nasal cannula oxygen  Post-op Assessment: Report given to RN  Post vital signs: Reviewed and stable  Last Vitals:  Vitals Value Taken Time  BP 104/71 10/13/19 0853  Temp 36.4 C 10/13/19 0853  Pulse 62 10/13/19 0856  Resp 13 10/13/19 0856  SpO2 100 % 10/13/19 0856  Vitals shown include unvalidated device data.  Last Pain:  Vitals:   10/13/19 0616  TempSrc: Oral  PainSc: 0-No pain      Patients Stated Pain Goal: 5 ([id number])  Complications: No apparent anesthesia complications

## 2019-10-13 NOTE — Anesthesia Procedure Notes (Signed)
Procedure Name: Intubation Date/Time: 10/13/2019 7:43 AM Performed by: Bonney Aid, CRNA Pre-anesthesia Checklist: Patient identified, Emergency Drugs available, Suction available and Patient being monitored Patient Re-evaluated:Patient Re-evaluated prior to induction Oxygen Delivery Method: Circle system utilized Preoxygenation: Pre-oxygenation with 100% oxygen Induction Type: IV induction Ventilation: Mask ventilation without difficulty Laryngoscope Size: Mac and 3 Grade View: Grade I Tube type: Oral Tube size: 7.0 mm Number of attempts: 1 Airway Equipment and Method: Stylet and Oral airway Placement Confirmation: ETT inserted through vocal cords under direct vision,  positive ETCO2 and breath sounds checked- equal and bilateral Secured at: 21 cm Tube secured with: Tape Dental Injury: Teeth and Oropharynx as per pre-operative assessment

## 2019-10-13 NOTE — H&P (Signed)
Cynthia Thornton is an 73 y.o. female.[id number]   RP:  LPS BSO for h/o Breast Ca and Fam h/o Ovarian and genetic variant positive  HPI: Personal history of breast cancer and a family history of breast cancer in a sister, cousin and maternal aunt. Also has ovarian cancer in a separate maternal aunt. 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 Recent CA 125 was 5.  Ultrasound transvaginal 12/2018 status post hysterectomy shows normal vaginal cuff. Normal-appearing right ovary. Left ovary not visualized. Left adnexa without visual pathology. Cul-de-sac negative  Pertinent Gynecological History:   Menstrual History: No LMP recorded. Patient has had a hysterectomy.    Past Medical History:  Diagnosis Date  . Arthritis   . Breast cancer, left [facility]) oncologist-- dr [name]   dx 09-26-2017--- IDC, Stage IA, Grade 2, ER/PR +;  HER2 negative;  s/p  breast lumpectomy w/ node dissection 11-02-2017;  completed radiation 01-09-2018 (left breast genetic screening panel 2017 with variant of unknown significance AXINA)  . COPD (chronic obstructive pulmonary disease) ([place])    pulmologist-- dr Dillard Essex-- w/ chronic lingular scarring  (last exceratbation bronchiectasis 07/ 2020)  . Dyslipidemia    followed by cardiology/ vascular-- dr g. adams ([facility] in [place])  family history strokes  . Eczema   . Family history of breast cancer   . Family history of colon cancer   . GERD (gastroesophageal reflux disease)   . Hiatal hernia   . History of esophageal stricture    s/p dilatation 2008  . History of external beam radiation therapy    left breast 12-19-2017  to 01-09-2018  . History of hyperthyroidism    endocrinologist-- (lov 04-11-2019 epic) dr Loanne Drilling, dx 2009 due to multinodular goiter ,  had taken medication for few months 2010 stopped due to norma TFT;    . Insomnia   . Multinodular goiter last thyroid ultrasound in epic 12-18-2018   endocrinologist-- dr Loanne Drilling,  dx 2009 w/ hyperthroidism with medication until normal TFT in 2010;    . Osteopenia 02/2018   T score -2.4 distal third of radius.  FRAX 11% / 1.8% stable at other points of interest to include spine, right and left hip  . Personal history of colonic polyps 09/06/2001   hyperplastic  . Pityriasis rosea   . PONV (postoperative nausea and vomiting)    severe  . Primary osteoarthritis of left knee    Severe Patellofemoral arthritis  . 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 Wears contact lenses     Past Surgical History:  Procedure Laterality Date  . BREAST LUMPECTOMY WITH RADIOACTIVE SEED AND SENTINEL LYMPH NODE BIOPSY Left 11/02/2017   Procedure: LEFT BREAST LUMPECTOMY WITH RADIOACTIVE SEED AND LEFT AXILLARY DEEP SENTINEL LYMPH NODE BIOPSY, INJECT BLUE DYE LEFT BREAST;  Surgeon: [name], MD;  Location: [facility];  Service: General;  Laterality: Left;  . BREAST SURGERY  2000   Breast cyst removed, left  . TONSILLECTOMY  age 15  . TUBAL LIGATION Bilateral yrs ago  . VAGINAL HYSTERECTOMY  1991   Right ovarian cystectomy  . VIDEO BRONCHOSCOPY Bilateral 03/16/2015   Procedure: VIDEO BRONCHOSCOPY WITHOUT FLUORO;  Surgeon: Brand Males, MD;  Location: [facility] ENDOSCOPY;  Service: Endoscopy;  Laterality: Bilateral;    Family History  Problem Relation Age of Onset  . Osteoporosis Mother   . Lung cancer Mother   . Stroke Mother   .  Diabetes Father   . Hypertension Father   . Osteoporosis Father   . Colon cancer Father 21  . Breast cancer Sister        Age 70  . Ovarian cancer Maternal Aunt 60  . Breast cancer Cousin        maternal-Age 58  . Colon cancer Paternal Uncle 15  . Colon cancer Cousin        maternal first cousin  . Breast cancer Maternal Aunt        great aunt- Age unknown  . Crohn's disease Son   . Leukemia Maternal Grandfather     Social History:  reports that she quit  smoking about 23 years ago. She has a 3.00 pack-year smoking history. She has never used smokeless tobacco. She reports current alcohol use. She reports that she does not use drugs.  Allergies:  Allergies  Allergen Reactions  . Doxycycline Rash  . Oxycodone Nausea And Vomiting  . Codeine Nausea Only  . Propranolol Hcl Other (See Comments)    "Dizziness and heart racing"    Medications Prior to Admission  Medication Sig Dispense Refill Last Dose  . aspirin EC 81 MG tablet Take 81 mg by mouth daily.   Past Week at Unknown time  . B Complex-C (B-COMPLEX WITH VITAMIN C) tablet Take 1 tablet by mouth daily.   10/12/2019 at Unknown time  . cetirizine (ZYRTEC) 10 MG tablet Take 10 mg by mouth daily.   10/13/2019 at 0500  . Cholecalciferol (VITAMIN D) 2000 units CAPS Take 2,000 Units by mouth daily.   10/12/2019 at Unknown time  . clorazepate (TRANXENE) 7.5 MG tablet Take 1 tablet (7.5 mg total) by mouth at bedtime as needed for anxiety. 30 tablet 2 10/12/2019 at Unknown time  . clotrimazole (CLOTRIMAZOLE ANTI-FUNGAL) 1 % cream Apply 1 application topically 2 (two) times daily. (Patient taking differently: Apply 1 application topically 2 (two) times daily as needed. ) 60 g 1 10/12/2019 at Unknown time  . famotidine (PEPCID) 20 MG tablet Take 1 tablet (20 mg total) by mouth 2 (two) times daily. (Patient taking differently: Take 20 mg by mouth at bedtime. ) 180 tablet 1 10/12/2019 at Unknown time  . omeprazole (PRILOSEC) 40 MG capsule Take 1 capsule (40 mg total) by mouth daily. (Patient taking differently: Take 40 mg by mouth daily. ) 90 capsule 3 10/13/2019 at 0400  . promethazine (PHENERGAN) 25 MG suppository Place 1 suppository (25 mg total) rectally every 12 (twelve) hours as needed for nausea or vomiting. 30 each 1 Past Month at Unknown time  . Respiratory Therapy Supplies (FLUTTER) DEVI 1 Units by Does not apply route as needed. 1 each 0 10/12/2019 at Unknown time  . guaiFENesin-dextromethorphan  (ROBITUSSIN DM) 100-10 MG/5ML syrup Take 5 mLs by mouth every 4 (four) hours as needed for cough. Every 4 - 6 hours as needed.   More than a month at Unknown time    REVIEW OF SYSTEMS: A ROS was performed and pertinent positives and negatives are included in the history.  GENERAL: No fevers or chills. HEENT: No change in vision, no earache, sore throat or sinus congestion. NECK: No pain or stiffness. CARDIOVASCULAR: No chest pain or pressure. No palpitations. PULMONARY: No shortness of breath, cough or wheeze. GASTROINTESTINAL: No abdominal pain, nausea, vomiting or diarrhea, melena or bright red blood per rectum. GENITOURINARY: No urinary frequency, urgency, hesitancy or dysuria. MUSCULOSKELETAL: No joint or muscle pain, no back pain, no recent trauma. DERMATOLOGIC: No rash,  no itching, no lesions. ENDOCRINE: No polyuria, polydipsia, no heat or cold intolerance. No recent change in weight. HEMATOLOGICAL: No anemia or easy bruising or bleeding. NEUROLOGIC: No headache, seizures, numbness, tingling or weakness. PSYCHIATRIC: No depression, no loss of interest in normal activity or change in sleep pattern.     Blood pressure 111/72, pulse (!) 0, temperature 97.7 F (36.5 C), temperature source Oral, resp. rate 14, height 5' 4"  (1.626 m), weight 63.3 kg, SpO2 100 %.  Physical Exam:  See office notes  Pelvic US 12/2018:  T/V images status post hysterectomy shows normal vaginal cuff. Normal-appearing right ovary. Left ovary not visualized. Left adnexa without visual pathology. Cul-de-sac negative  Covid negative   Assessment/Plan:  73 y.o. [id number]   1. History of breast cancer Personal h/o Breast Ca with intolerance to the Anti-Estrogen treatment.  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  Fam h/o Ovarian Ca and Fam h/o Breast Ca.  Decision to proceed with LPS BSO.  Surgery and risks thoroughly reviewed.  Preop management and postop precautions reviewed. Patient voiced  understanding and agreement with plan.  2. Family history of ovarian cancer As above.  3. Family history of breast cancer As above.                        Patient was counseled as to the risk of surgery to include the following:  1. Infection (prohylactic antibiotics will be administered)  2. DVT/Pulmonary Embolism (prophylactic pneumo compression stockings will be used)  3.Trauma to internal organs requiring additional surgical procedure to repair any injury to internal organs requiring perhaps additional hospitalization days.  4.Hemmorhage requiring transfusion and blood products which carry risks such as anaphylactic reaction, hepatitis and AIDS.  Patient had received literature information on the procedure scheduled and all her questions were answered and fully accepts all risks.  Marie-Lyne Emile Kyllo 10/13/2019, 7:17 AM

## 2019-10-13 NOTE — Op Note (Signed)
Operative Note  10/13/2019  9:07 AM  PATIENT:  Cynthia Thornton  73 y.o. 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  Adnexal adhesions with bowels bilaterally.  Bilateral ovaries and distal tubes normal.  DESCRIPTION OF OPERATION: Under general anesthesia with laryngeal mask, the patient is in lithotomy position.  She is prepped with DuraPrep on the abdomen and Betadine on the supra pubic, vulvar and vaginal areas.  The bladder catheter is inserted in the bladder.  A sponge on a stick is inserted in the vagina.  The patient is draped as usual.  Timeout is done.  We go to the abdomen.  The infraumbilical area is infiltrated with Marcaine one quarter plain.  A 1.5 cm incision is done with the scalpel at the infraumbilical area.  The aponeurosis is grasped with Coker's.  The aponeurosis is opened under direct vision with Mayo scissors.  The parietal peritoneum is opened bluntly with the finger.  A pursestring stitch of Vicryl 0 is put on the aponeurosis.  The Sheryle Hail is inserted under direct vision.  Up pneumoperitoneum is created with CO2.  The camera is inserted at that level.  Inspection of the abdominopelvic areas reveal moderate adhesions between the bowels and bilateral adnexae.  Both ovaries and distal tubes are normal.  The liver is normal.  Pictures are taken of all those structures.  We add to assistant ports at the lower abdomen on the right and left.  The skin is infiltrated with  Marcaine one quarter plain at both sites, 5 mm incisions are made with a scalpel.  5 mm ports are inserted under direct vision.  We start with peritoneal washings.  We then lysed the bowel adhesions with the right adnexa using the laparoscopic scissors and the harmonic.  Both ureters are in normal anatomic position.  We cauterized the right infundibulopelvic ligament and section it with the harmonic scalpel.  We followed the lower aspect of the right ovary cauterizing and sectioning progressively.  The right ovary is completely detached with the distal right tube.  It is put in the pelvic cavity.  We then lysed the adhesions with the left ovary using the laparoscopic scissors and the harmonic scalpel.  We cauterized and sectioned the left infundibulopelvic ligament.  We then followed at the lower border of the left ovary, cauterizing and sectioning with the harmonic scalpel.  The left ovary and distal tubes are completely detached.  Hemostasis is adequate at all levels.  Both specimens are held in an atraumatic clamp.  We switched to a 5 mm camera.  The Endobag is inserted at the infraumbilical port.  Both specimens are easily put in the bag and removed from the pelvic cavity.  The specimens are sent together to pathology.  We then confirmed good hemostasis at all levels.  Pictures are taken.  We irrigated and suctioned the pelvic cavity.  Laparoscopic instruments are removed.  The ports are removed under direct vision.  The CO2 is evacuated.  The pursestring stitch at the infraumbilical incision is attached.  We then closed the 3  skin incisions with separate stitches of Monocryl 4-0.  Dermabond is added on the 3 incisions.  Honeycomb dressing is applied on the infraumbilical incision.  The sponge on a stick is removed from the vagina.  The Foley is removed from the bladder.  The patient is brought to recovery room in good and stable status.  ESTIMATED BLOOD LOSS: 5 mL   Intake/Output Summary (Last 24 hours) at  10/13/2019 [id number] Last data filed at 10/13/2019 0855 Gross per 24 hour  Intake 600 ml  Output 5 ml  Net 595 ml     BLOOD ADMINISTERED:none   LOCAL MEDICATIONS USED:  MARCAINE     SPECIMEN:  Source of Specimen:  Bilateral Ovaries and distal tubes.  Peritoneal washings  DISPOSITION OF SPECIMEN:  PATHOLOGY  COUNTS:  YES  PLAN OF CARE: Transfer to PACU  Marie-Lyne LavoieMD9:07 AM

## 2019-10-13 NOTE — Discharge Instructions (Signed)
DISCHARGE INSTRUCTIONS: Laparoscopy  The following instructions have been prepared to help you care for yourself upon your return home today.  Wound care: Marland Kitchen Do not get the incision wet for the first 24 hours. The incision should be kept clean and dry. . The Band-Aids or dressings may be removed the day after surgery. . Should the incision become sore, red, and swollen after the first week, check with your doctor.  Personal hygiene: . Shower the day after your procedure.  Activity and limitations: . Do NOT drive or operate any equipment today. . Do NOT lift anything more than 15 pounds for 2-3 weeks after surgery. . Do NOT rest in bed all day. . Walking is encouraged. Walk each day, starting slowly with 5-minute walks 3 or 4 times a day. Slowly increase the length of your walks. . Walk up and down stairs slowly. . Do NOT do strenuous activities, such as golfing, playing tennis, bowling, running, biking, weight lifting, gardening, mowing, or vacuuming for 2-4 weeks. Ask your doctor when it is okay to start.  Diet: Eat a light meal as desired this evening. You may resume your usual diet tomorrow.  Return to work: This is dependent on the type of work you do. For the most part you can return to a desk job within a week of surgery. If you are more active at work, please discuss this with your doctor.  What to expect after your surgery: You may have a slight burning sensation when you urinate on the first day. You may have a very small amount of blood in the urine. Expect to have a small amount of vaginal discharge/light bleeding for 1-2 weeks. It is not unusual to have abdominal soreness and bruising for up to 2 weeks. You may be tired and need more rest for about 1 week. You may experience shoulder pain for 24-72 hours. Lying flat in bed may relieve it.  Call your doctor for any of the following: . Develop a fever of 100.4 or greater . Inability to urinate 6 hours after discharge from  hospital . Severe pain not relieved by pain medications . Persistent of heavy bleeding at incision site . Redness or swelling around incision site after a week . Increasing nausea or vomiting  No advil, aleve, motrin, ibuprofen until 2 pm today  Post Anesthesia Home Care Instructions  Activity: Get plenty of rest for the remainder of the day. A responsible adult should stay with you for 24 hours following the procedure.  For the next 24 hours, DO NOT: -Drive a car -Paediatric nurse -Drink alcoholic beverages -Take any medication unless instructed by your physician -Make any legal decisions or sign important papers.  Meals: Start with liquid foods such as gelatin or soup. Progress to regular foods as tolerated. Avoid greasy, spicy, heavy foods. If nausea and/or vomiting occur, drink only clear liquids until the nausea and/or vomiting subsides. Call your physician if vomiting continues.  Special Instructions/Symptoms: Your throat may feel dry or sore from the anesthesia or the breathing tube placed in your throat during surgery. If this causes discomfort, gargle with warm salt water. The discomfort should disappear within 24 hours.  If you had a scopolamine patch placed behind your ear for the management of post- operative nausea and/or vomiting:  1. The medication in the patch is effective for 72 hours, after which it should be removed.  Wrap patch in a tissue and discard in the trash. Wash hands thoroughly with soap and water.  2. You may remove the patch earlier than 72 hours if you experience unpleasant side effects which may include dry mouth, dizziness or visual disturbances. 3. Avoid touching the patch. Wash your hands with soap and water after contact with the patch.

## 2019-10-14 LAB — CYTOLOGY - NON PAP

## 2019-10-14 LAB — SURGICAL PATHOLOGY

## 2019-10-15 ENCOUNTER — Other Ambulatory Visit: Payer: Self-pay | Admitting: Internal Medicine

## 2019-10-16 NOTE — Telephone Encounter (Signed)
Sent!

## 2019-10-16 NOTE — Telephone Encounter (Signed)
Last OV 03/28/19 Last refill 03/28/19 #30/2 Next OV 12/26/19

## 2019-10-22 ENCOUNTER — Encounter: Payer: Self-pay | Admitting: Gynecology

## 2019-10-22 ENCOUNTER — Telehealth: Payer: Self-pay | Admitting: *Deleted

## 2019-10-22 ENCOUNTER — Ambulatory Visit (INDEPENDENT_AMBULATORY_CARE_PROVIDER_SITE_OTHER): Payer: Medicare Other | Admitting: Gynecology

## 2019-10-22 ENCOUNTER — Other Ambulatory Visit: Payer: Self-pay

## 2019-10-22 ENCOUNTER — Encounter: Payer: Self-pay | Admitting: Internal Medicine

## 2019-10-22 VITALS — BP 122/80

## 2019-10-22 DIAGNOSIS — Z09 Encounter for follow-up examination after completed treatment for conditions other than malignant neoplasm: Secondary | ICD-10-CM

## 2019-10-22 MED ORDER — TRIAMCINOLONE ACETONIDE 0.1 % EX CREA: 1.0000 "application " | TOPICAL_CREAM | Freq: Two times a day (BID) | CUTANEOUS | 0 refills | Status: DC

## 2019-10-22 NOTE — Telephone Encounter (Signed)
Patient called post surgery on 10/13/19 called concerned about her incision site and rash around this site. No fever, very light drainage from site, red color drainage. Patient reports rash has been there and not approved and she is concerned. Patient informed to come at 2pm to be seen by Dr.Fontaine. patient reports no pain.

## 2019-10-22 NOTE — Progress Notes (Signed)
    Cynthia Thornton 12/18/1945 [id number]        73 y.o.  [id number] presents having undergone laparoscopic bilateral salpingo-oophorectomies 10/13/2019.  Since then has noticed a rash around her incision sites which is getting worse.  Otherwise doing well with minimal pain, eating, drinking, voiding without fevers  Past medical history,surgical history, problem list, medications, allergies, family history and social history were all reviewed and documented in the EPIC chart.  Directed ROS with pertinent positives and negatives documented in the history of present illness/assessment and plan.  Exam: Cynthia Thornton assistant Vitals:   10/22/19 1401  BP: 122/80   General appearance:  Normal Abdomen with vesicular rash surrounding all 3 incision sites.  The Dermabond adhesive covering all incisions were is removed as well as a suture in the right 5 mm port and umbilical port.  Assessment/Plan:  73 y.o. [id number] with rash consistent with an allergic reaction to the Dermabond.  As it is involving all 3 incision sites I doubt infectious such as cellulitis.  I removed all remaining Dermabond.  Instructed patient to shower when she gets home and clean over these areas with soap and then apply triamcinolone 0.1% cream 3 times daily.  She will call if this rash does not completely clear over the next several days or certainly if it worsens she will call ASAP    Cynthia Auerbach MD, 2:15 PM 10/22/2019

## 2019-10-22 NOTE — Patient Instructions (Signed)
Apply the steroid cream 3 times daily to the rash area.  Call if the rash does not improve or starts to get worse.

## 2019-10-27 ENCOUNTER — Telehealth: Payer: Self-pay | Admitting: *Deleted

## 2019-10-27 ENCOUNTER — Ambulatory Visit (INDEPENDENT_AMBULATORY_CARE_PROVIDER_SITE_OTHER): Payer: Medicare Other | Admitting: Gynecology

## 2019-10-27 ENCOUNTER — Encounter: Payer: Self-pay | Admitting: Gynecology

## 2019-10-27 ENCOUNTER — Other Ambulatory Visit: Payer: Self-pay | Admitting: *Deleted

## 2019-10-27 ENCOUNTER — Other Ambulatory Visit: Payer: Self-pay

## 2019-10-27 VITALS — BP 118/76

## 2019-10-27 DIAGNOSIS — R21 Rash and other nonspecific skin eruption: Secondary | ICD-10-CM

## 2019-10-27 MED ORDER — SULFAMETHOXAZOLE-TRIMETHOPRIM 800-160 MG PO TABS: 1.0000 | ORAL_TABLET | Freq: Two times a day (BID) | ORAL | 0 refills | Status: DC

## 2019-10-27 MED ORDER — HYDROXYZINE HCL 25 MG PO TABS: 25.0000 mg | ORAL_TABLET | Freq: Three times a day (TID) | ORAL | 0 refills | Status: DC | PRN

## 2019-10-27 MED ORDER — METHYLPREDNISOLONE 4 MG PO TBPK: ORAL_TABLET | ORAL | 0 refills | Status: DC

## 2019-10-27 NOTE — Progress Notes (Signed)
    Cynthia Thornton 20-Dec-1945 [id number]        73 y.o.  [id number] presents complaining of worsening skin rash.  She was seen 5 days ago postop following laparoscopic BSO.  Had developed a rash around her skin incisions felt to be secondary to an allergic reaction to the Dermabond.  Was prescribed steroid cream and has been using Benadryl.  Notes the rash is getting worse.  No fever, chills, nausea, vomiting, diarrhea.  No pain from the surgery.  Past medical history,surgical history, problem list, medications, allergies, family history and social history were all reviewed and documented in the EPIC chart.  Directed ROS with pertinent positives and negatives documented in the history of present illness/assessment and plan.  Exam: Vitals:   10/27/19 1216  BP: 118/76   General appearance:  Normal Abdomen with erythematous papular rash extending across the lower abdomen onto the upper thighs and lower flank region.  No drainage.  Incisions intact nontender without evidence of infection.  Assessment/Plan:  73 y.o. [id number] with worsening rash felt to be allergic reaction.  Doubt cellulitis as is not contiguous, warm or cellulitic in appearance.  Recommend Medrol Dosepak and Atarax 25 mg every 8 hours as needed for pruritus.  We will also cover with Septra DS 1 p.o. twice daily to prevent secondary infection.  Will make a backup dermatology appointment in several days and if the rash does not respond quickly then will have her see the dermatologist.    [name] MD, 12:27 PM 10/27/2019

## 2019-10-27 NOTE — Telephone Encounter (Signed)
1. Dr.Fontaine asked me to check with the pharmacy regarding Medrol Taper dose pack. I called [facility] and spoke with pharmacist and was told to use Medrol Dosepak 4mg  which is a standard taper pack they are aware of tapering direction and to send and type in comments to "dosepack taper" Rx sent.   2. Dr.Fontaine also asked me to get a referral to dermatology regarding skin rash. Office notes to to Dermatology specialist [phone] they will call to schedule.

## 2019-10-27 NOTE — Patient Instructions (Signed)
Started on the steroid Dosepak, antibiotics and Atarax for itching  Office will call to arrange dermatology appointment end of this week if not better.

## 2019-10-29 NOTE — Telephone Encounter (Signed)
Patient scheduled on 10/30/19 @ 8:15am with Rainey Pines PA at Dermatology specialist. I called patient to relay time and date. Patient said she can't make tomorrows appointment because her daughter has had "melt down" that lives in [place] and went to go help her. I gave patient # to call dermatology to reschedule appointment. Patient states the rash has gotten worse and is now up to her breast.

## 2019-10-30 ENCOUNTER — Telehealth: Payer: Self-pay | Admitting: *Deleted

## 2019-10-30 MED ORDER — TRIAMCINOLONE ACETONIDE 0.1 % EX CREA: 1.0000 "application " | TOPICAL_CREAM | Freq: Two times a day (BID) | CUTANEOUS | 0 refills | Status: DC

## 2019-10-30 NOTE — Telephone Encounter (Signed)
She should be taking the steroid Dosepak orally that I prescribed.  Also okay to refill triamcinolone 0.1% cream with 60 g tube

## 2019-10-30 NOTE — Telephone Encounter (Signed)
Patient called requesting refill on triamcinolone cream 0.1%, patient said she is using cream 3 times daily reports this helps with the itching. I did tell her the Rx direction say twice weekly. She has dermatology appointment on 11/03/19 for rash.  Please advise

## 2019-10-30 NOTE — Telephone Encounter (Signed)
Patient informed, she is still taking dosepak orally. Rx sent for triamcinolone cream.

## 2019-11-04 ENCOUNTER — Other Ambulatory Visit: Payer: Self-pay

## 2019-11-04 DIAGNOSIS — L239 Allergic contact dermatitis, unspecified cause: Secondary | ICD-10-CM | POA: Diagnosis not present

## 2019-11-05 ENCOUNTER — Encounter: Payer: Self-pay | Admitting: Obstetrics & Gynecology

## 2019-11-05 ENCOUNTER — Ambulatory Visit (INDEPENDENT_AMBULATORY_CARE_PROVIDER_SITE_OTHER): Payer: Medicare PPO | Admitting: Obstetrics & Gynecology

## 2019-11-05 VITALS — BP 126/80

## 2019-11-05 DIAGNOSIS — Z882 Allergy status to sulfonamides status: Secondary | ICD-10-CM

## 2019-11-05 DIAGNOSIS — Z881 Allergy status to other antibiotic agents status: Secondary | ICD-10-CM

## 2019-11-05 DIAGNOSIS — Z09 Encounter for follow-up examination after completed treatment for conditions other than malignant neoplasm: Secondary | ICD-10-CM

## 2019-11-05 MED ORDER — METHYLPREDNISOLONE 4 MG PO TBPK: ORAL_TABLET | ORAL | 0 refills | Status: DC

## 2019-11-05 NOTE — Progress Notes (Signed)
    ASTER DAUER 26-Nov-1945 [id number]        74 y.o.  [id number]   RP: Postop LPS BSO/Peritoneal washings 10/13/2019  HPI: Very good postop healing except for an allergic reaction to Dermabond.  The differential was a cellulitis, patient was therefore treated with Bactrim for which she developed an allergy as well.  Now finished with the Bactrim but redness and itching persists on the abdomen.  Incisions closed with no pain or drainage.  No abdominal pelvic pain.  No vaginal bleeding.  No vaginal discharge.  No fever.  Urine and bowel movements normal.   OB History  Gravida Para Term Preterm AB Living  3 2 2   1 2   SAB TAB Ectopic Multiple Live Births               # Outcome Date GA Lbr Len/2nd Weight Sex Delivery Anes PTL Lv  3 AB           2 Term           1 Term             Past medical history,surgical history, problem list, medications, allergies, family history and social history were all reviewed and documented in the EPIC chart.   Directed ROS with pertinent positives and negatives documented in the history of present illness/assessment and plan.  Exam:  Vitals:   11/05/19 1517  BP: 126/80   General appearance:  Normal  Abdomen: Erythema at the upper and mid abdomen.  No longer has erythema around the incisions.  Incisions are intact, well closed with no induration or discharge.  Gynecologic exam: Vulva normal.  Bimanual exam: NT verted uterus, normal volume, mobile, nontender.  No adnexal mass, nontender bilaterally.  Pathology 10/13/2019: FINAL MICROSCOPIC DIAGNOSIS:   A. BILATERIAL FALLOPIAN TUBES, OVARIES:  - Benign ovaries with simple serous cyst (0.6 cm)  - Benign fallopian tubes  - No malignancy identified    Assessment/Plan:  74 y.o. [id number]   1. Status post gynecological surgery, follow-up exam Very good postop healing, but allergic reaction to Dermabond and then to Septra.  No other complication from surgery.  Surgical findings reviewed with  patient.  Pathology benign, reviewed with patient.  Can resume all physical activities at this time.  2. Allergy to trimethoprim/sulfamethoxazole Will treat with a second prednisone Dosepak.  Prescription sent to pharmacy.  Other orders - methylPREDNISolone (MEDROL DOSEPAK) 4 MG TBPK tablet; Medrol dose pack taper dose # 21 pills   [name] MD, 4:09 PM 11/05/2019

## 2019-11-10 ENCOUNTER — Encounter: Payer: Self-pay | Admitting: Obstetrics & Gynecology

## 2019-11-10 DIAGNOSIS — L821 Other seborrheic keratosis: Secondary | ICD-10-CM | POA: Diagnosis not present

## 2019-11-10 DIAGNOSIS — L218 Other seborrheic dermatitis: Secondary | ICD-10-CM | POA: Diagnosis not present

## 2019-11-10 NOTE — Patient Instructions (Signed)
1. Status post gynecological surgery, follow-up exam Very good postop healing, but allergic reaction to Dermabond and then to Septra.  No other complication from surgery.  Surgical findings reviewed with patient.  Pathology benign, reviewed with patient.  Can resume all physical activities at this time.  2. Allergy to trimethoprim/sulfamethoxazole Will treat with a second prednisone Dosepak.  Prescription sent to pharmacy.  Other orders - methylPREDNISolone (MEDROL DOSEPAK) 4 MG TBPK tablet; Medrol dose pack taper dose # 21 pills  Zykira, it was a pleasure seeing you today!

## 2019-11-12 ENCOUNTER — Encounter: Payer: Self-pay | Admitting: *Deleted

## 2019-11-23 DIAGNOSIS — R300 Dysuria: Secondary | ICD-10-CM | POA: Diagnosis not present

## 2019-11-25 NOTE — Telephone Encounter (Signed)
No, as long as she has completed antibiotic before getting vaccine and does not have a fever she should be fine to get it

## 2019-12-19 ENCOUNTER — Ambulatory Visit: Payer: Medicare Other | Admitting: *Deleted

## 2019-12-19 ENCOUNTER — Encounter: Payer: Medicare Other | Admitting: Internal Medicine

## 2019-12-19 DIAGNOSIS — Z8249 Family history of ischemic heart disease and other diseases of the circulatory system: Secondary | ICD-10-CM | POA: Diagnosis not present

## 2019-12-19 DIAGNOSIS — R42 Dizziness and giddiness: Secondary | ICD-10-CM | POA: Diagnosis not present

## 2019-12-19 DIAGNOSIS — E0590 Thyrotoxicosis, unspecified without thyrotoxic crisis or storm: Secondary | ICD-10-CM | POA: Diagnosis not present

## 2019-12-19 DIAGNOSIS — E7800 Pure hypercholesterolemia, unspecified: Secondary | ICD-10-CM | POA: Diagnosis not present

## 2019-12-26 ENCOUNTER — Encounter: Payer: Self-pay | Admitting: Internal Medicine

## 2019-12-26 ENCOUNTER — Ambulatory Visit (INDEPENDENT_AMBULATORY_CARE_PROVIDER_SITE_OTHER): Payer: Medicare PPO | Admitting: Internal Medicine

## 2019-12-26 ENCOUNTER — Encounter: Payer: Medicare Other | Admitting: Internal Medicine

## 2019-12-26 ENCOUNTER — Other Ambulatory Visit: Payer: Self-pay

## 2019-12-26 ENCOUNTER — Ambulatory Visit: Payer: Medicare Other | Admitting: *Deleted

## 2019-12-26 VITALS — BP 123/78 | HR 76 | Temp 96.5°F | Resp 16 | Ht 64.0 in | Wt 142.1 lb

## 2019-12-26 DIAGNOSIS — R221 Localized swelling, mass and lump, neck: Secondary | ICD-10-CM | POA: Diagnosis not present

## 2019-12-26 DIAGNOSIS — E042 Nontoxic multinodular goiter: Secondary | ICD-10-CM

## 2019-12-26 DIAGNOSIS — Z0000 Encounter for general adult medical examination without abnormal findings: Secondary | ICD-10-CM | POA: Diagnosis not present

## 2019-12-26 DIAGNOSIS — R739 Hyperglycemia, unspecified: Secondary | ICD-10-CM

## 2019-12-26 LAB — COMPREHENSIVE METABOLIC PANEL
ALT: 12 U/L (ref 0–35)
AST: 15 U/L (ref 0–37)
Albumin: 4.3 g/dL (ref 3.5–5.2)
Alkaline Phosphatase: 88 U/L (ref 39–117)
BUN: 20 mg/dL (ref 6–23)
CO2: 28 mEq/L (ref 19–32)
Calcium: 9.3 mg/dL (ref 8.4–10.5)
Chloride: 97 mEq/L (ref 96–112)
Creatinine, Ser: 0.9 mg/dL (ref 0.40–1.20)
GFR: 61.59 mL/min (ref >60.00)
Glucose, Bld: 88 mg/dL (ref 70–99)
Potassium: 4.7 mEq/L (ref 3.5–5.1)
Sodium: 131 mEq/L — ABNORMAL LOW (ref 135–145)
Total Bilirubin: 0.4 mg/dL (ref 0.2–1.2)
Total Protein: 6.5 g/dL (ref 6.0–8.3)

## 2019-12-26 LAB — TSH: TSH: 3.1 u[IU]/mL (ref 0.35–4.50)

## 2019-12-26 LAB — HEMOGLOBIN A1C: Hgb A1c MFr Bld: 5.3 % (ref 4.6–6.5)

## 2019-12-26 NOTE — Progress Notes (Signed)
Pre visit review using our clinic review tool, if applicable. No additional management support is needed unless otherwise documented below in the visit note. 

## 2019-12-26 NOTE — Progress Notes (Signed)
Subjective:    Patient ID: Cynthia Thornton, female    DOB: February 04, 1946, 74 y.o.   MRN: [id number]  DOS:  12/26/2019 Type of visit - description: CPX Since the last office visit she is doing well, had a bilateral salpingo-oophorectomy due to her history of breast cancer. Recuperating well. Still feels a lump on the right neck Reports tinnitus without HOH for the last several months. Worse at night. Otherwise she feels well   Review of Systems  Other than above, a 14 point review of systems is negative      Past Medical History:  Diagnosis Date  . Arthritis   . Breast cancer, left [facility]) oncologist-- dr [name]   dx 09-26-2017--- IDC, Stage IA, Grade 2, ER/PR +;  HER2 negative;  s/p  breast lumpectomy w/ node dissection 11-02-2017;  completed radiation 01-09-2018 (left breast genetic screening panel 2017 with variant of unknown significance AXINA)  . COPD (chronic obstructive pulmonary disease) ([place])    pulmologist-- dr Dillard Essex-- w/ chronic lingular scarring  (last exceratbation bronchiectasis 07/ 2020)  . Dyslipidemia    followed by cardiology/ vascular-- dr g. adams ([facility] in [place])  family history strokes  . Eczema   . Family history of breast cancer   . Family history of colon cancer   . GERD (gastroesophageal reflux disease)   . Hiatal hernia   . History of esophageal stricture    s/p dilatation 2008  . History of external beam radiation therapy    left breast 12-19-2017  to 01-09-2018  . History of hyperthyroidism    endocrinologist-- (lov 04-11-2019 epic) dr Loanne Drilling, dx 2009 due to multinodular goiter ,  had taken medication for few months 2010 stopped due to norma TFT;    . Insomnia   . Multinodular goiter last thyroid ultrasound in epic 12-18-2018   endocrinologist-- dr Loanne Drilling,  dx 2009 w/ hyperthroidism with medication until normal TFT in 2010;    . Osteopenia 02/2018   T score -2.4 distal third of radius.  FRAX 11% / 1.8% stable at other points  of interest to include spine, right and left hip  . Personal history of colonic polyps 09/06/2001   hyperplastic  . Pityriasis rosea   . PONV (postoperative nausea and vomiting)    severe  . Primary osteoarthritis of left knee    Severe Patellofemoral arthritis  . 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 Wears contact lenses     Past Surgical History:  Procedure Laterality Date  . BREAST LUMPECTOMY WITH RADIOACTIVE SEED AND SENTINEL LYMPH NODE BIOPSY Left 11/02/2017   Procedure: LEFT BREAST LUMPECTOMY WITH RADIOACTIVE SEED AND LEFT AXILLARY DEEP SENTINEL LYMPH NODE BIOPSY, INJECT BLUE DYE LEFT BREAST;  Surgeon: [name], MD;  Location: [facility];  Service: General;  Laterality: Left;  . BREAST SURGERY  2000   Breast cyst removed, left  . LAPAROSCOPIC BILATERAL SALPINGO OOPHERECTOMY Bilateral 10/13/2019   Procedure: LAPAROSCOPIC BILATERAL SALPINGO OOPHORECTOMY LYSIS OF ADHESIONS PERITONEAL WASHINGS ;  Surgeon: [name], MD;  Location: [facility];  Service: Gynecology;  Laterality: Bilateral;  request 7:30am OR time in [place] [facility] block requests one hour  . TONSILLECTOMY  age 82  . TUBAL LIGATION Bilateral yrs ago  . VAGINAL HYSTERECTOMY  1991   Right ovarian cystectomy  . VIDEO BRONCHOSCOPY Bilateral 03/16/2015   Procedure: VIDEO BRONCHOSCOPY WITHOUT FLUORO;  Surgeon: Brand Males, MD;  Location: [facility];  Service: Endoscopy;  Laterality: Bilateral;   Family History  Problem Relation Age of Onset  . Osteoporosis Mother   . Lung cancer Mother   . Stroke Mother   . Diabetes Father   . Hypertension Father   . Osteoporosis Father   . Colon cancer Father 19  . Breast cancer Sister        Age 30  . Ovarian cancer Maternal Aunt 60  . Breast cancer Cousin        maternal-Age 64  . Colon cancer Paternal Uncle 1  . Colon cancer Cousin        maternal first cousin  . Breast cancer  Maternal Aunt        great aunt- Age unknown  . Crohn's disease Son   . Leukemia Maternal Grandfather      Allergies as of 12/26/2019      Reactions   Doxycycline Rash   Oxycodone Nausea And Vomiting   Trimethoprim Sulfate [trimethoprim]    rash   Codeine Nausea Only   Propranolol Hcl Other (See Comments)   "Dizziness and heart racing"      Medication List       Accurate as of December 26, 2019 11:59 PM. If you have any questions, ask your nurse or doctor.        STOP taking these medications   clotrimazole 1 % cream Commonly known as: Clotrimazole Anti-Fungal Stopped by: Kathlene November, MD   methylPREDNISolone 4 MG Tbpk tablet Commonly known as: MEDROL DOSEPAK Stopped by: Kathlene November, MD   traMADol 50 MG tablet Commonly known as: ULTRAM Stopped by: Kathlene November, MD   triamcinolone cream 0.1 % Commonly known as: KENALOG Stopped by: Kathlene November, MD     TAKE these medications   aspirin EC 81 MG tablet Take 81 mg by mouth daily.   B-complex with vitamin C tablet Take 1 tablet by mouth daily.   cetirizine 10 MG tablet Commonly known as: ZYRTEC Take 10 mg by mouth daily.   clorazepate 7.5 MG tablet Commonly known as: TRANXENE TAKE 1 TABLET (7.5 MG TOTAL) BY MOUTH AT BEDTIME AS NEEDED FOR ANXIETY.   famotidine 20 MG tablet Commonly known as: PEPCID Take 1 tablet (20 mg total) by mouth 2 (two) times daily. What changed: when to take this   Flutter Devi 1 Units by Does not apply route as needed.   guaiFENesin-dextromethorphan 100-10 MG/5ML syrup Commonly known as: ROBITUSSIN DM Take 5 mLs by mouth every 4 (four) hours as needed for cough. Every 4 - 6 hours as needed.   omeprazole 40 MG capsule Commonly known as: PRILOSEC Take 1 capsule (40 mg total) by mouth daily.   promethazine 25 MG suppository Commonly known as: Phenergan Place 1 suppository (25 mg total) rectally every 12 (twelve) hours as needed for nausea or vomiting.   Vitamin D 50 MCG (2000 UT) Caps Take  2,000 Units by mouth daily.          Objective:   Physical Exam Neck:     BP 123/78 (BP Location: Right Arm, Patient Position: Sitting, Cuff Size: Small)   Pulse 76   Temp (!) 96.5 F (35.8 C) (Temporal)   Resp 16   Ht _0  (1.626 m)   Wt 142 lb 2 oz (64.5 kg)   SpO2 100%   BMI 24.40 kg/m  General: Well developed, NAD, BMI noted Neck: No  thyromegaly. See graphic  HEENT:  Normocephalic . Face symmetric, atraumatic Lungs:  CTA B  Normal respiratory effort, no intercostal retractions, no accessory muscle use. Heart: RRR,  no murmur.  Abdomen:  Not distended, soft, non-tender. No rebound or rigidity.   Lower extremities: no pretibial edema bilaterally  Skin: Exposed areas without rash. Not pale. Not jaundice Neurologic:  alert & oriented X3.  Speech normal, gait appropriate for age and unassisted Strength symmetric and appropriate for age.  Psych: Cognition and judgment appear intact.  Cooperative with normal attention span and concentration.  Behavior appropriate. No anxious or depressed appearing.     Assessment     Assessment  Bronchiectasis Dr Chase Caller Anxiety - Insomnia : tranxene   At hs, rx by gyn, previously, I'll Rx if needed  Endocrinology Dr. Loanne Drilling q 3 years, Last visit 03/2015 --H/o thyrotoxicosis on remission --Goiter   Osteopenia --T score -1.5  (2015), dexa 02-2016 , dexa 03/19/2018 (at gyn) DJD -- [facility] GI: GERD, HH and esophageal stricture EGD 2007, colon polyps Breast cancer, DX 08-2017, s/p lumpectomy,  XRT, B salpingo-oophorectomy History of many years: Symptoms recur 31s and 68s. H/o DUB  Plan Here for CPX Lump neck: Unchanged from last year, unclear etiology. Refer to ENT to be sure does not need further evaluation. Tinnitus: New symptom, likely benign. Bronchiectasis, last visit with pulmonology 7-20 20, doing well Anxiety insomnia: On Tranxene, does not take every day, well controlled, RF prn RTC 1 year   This visit  occurred during the SARS-CoV-2 public health emergency.  Safety protocols were in place, including screening questions prior to the visit, additional usage of staff PPE, and extensive cleaning of exam room while observing appropriate contact time as indicated for disinfecting solutions.

## 2019-12-26 NOTE — Patient Instructions (Addendum)
Please schedule Medicare Wellness with Glenard Haring.   GO TO THE LAB : Get the blood work     [place] Come back for a physical exam in 1 year, please make an appointment

## 2019-12-27 NOTE — Assessment & Plan Note (Signed)
Here for CPX Lump neck: Unchanged from last year, unclear etiology. Refer to ENT to be sure does not need further evaluation. Tinnitus: New symptom, likely benign. Bronchiectasis, last visit with pulmonology 7-20 20, doing well Anxiety insomnia: On Tranxene, does not take every day, well controlled, RF prn RTC 1 year

## 2019-12-27 NOTE — Assessment & Plan Note (Signed)
-  Td 2020; zostavax 2010, prevanr 2014; pnm shot 2 : 07-2015; s/p shingrix , had a flu shot   -CCS: Last colonoscopy 08-2015, + polyps, cscope 04/2019, next per GI.  Female care:  Sees gynecology, had a bilateral salpingo-oophorectomy 09/2019 (d/t breast ca) breast ca dx 08-2017 -Diet- exercise discussed , doing well -Labs: CMP, A1c, TSH. To get FLP at cardiology.

## 2020-01-01 NOTE — Progress Notes (Signed)
Nurse connected with patient 01/02/20 at 10:15 AM EST by a telephone enabled telemedicine application and verified that I am speaking with the correct person using two identifiers. Patient stated full name and DOB. Patient gave permission to continue with virtual visit. Patient's location was at home and Nurse's location was at [place] office.   Subjective:   Cynthia Thornton is a 74 y.o. female who presents for Medicare Annual (Subsequent) preventive examination.   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   Review of Systems:  Cardiac Risk Factors include: advanced age (>75mn, >>50women) Home Safety/Smoke Alarms: Feels safe in home. Smoke alarms in place.  Lives alone. 1 story home.   Female:   Mammo-  09/18/19     Dexa scan- 03/18/18       CCS-05/29/19. Recall 5 yrs.      Objective:     Vitals: Unable to assess. This visit is enabled though telemedicine due to Covid 19.   Advanced Directives 01/02/2020 10/13/2019 09/10/2019 12/16/2018 02/14/2018 12/14/2017 11/28/2017  Does Patient Have a Medical Advance Directive? No Yes No Yes Yes Yes Yes  Type of Advance Directive - [place] will [place] will [place] will  Does patient want to make changes to medical advance directive? - No - Patient declined - No - Patient declined - - -  Copy of [place] Chart? - No - copy requested - No - copy requested - No - copy requested No - copy requested  Would patient like information on creating a medical advance directive? No - Patient declined - No - Patient declined - - - -    Tobacco Social History   Tobacco Use  Smoking Status Former Smoker  . Packs/day: 0.10  . Years: 30.00  . Pack years: 3.00  . Quit date: 10/31/1995  . Years since quitting: 24.1  Smokeless Tobacco Never Used       Counseling given: Not Answered   Clinical Intake: Pain : No/denies pain     Past Medical History:  Diagnosis Date  . Arthritis   . Breast cancer, left ([facility] oncologist-- dr gLindi Adie  dx 09-26-2017--- IDC, Stage IA, Grade 2, ER/PR +;  HER2 negative;  s/p  breast lumpectomy w/ node dissection 11-02-2017;  completed radiation 01-09-2018 (left breast genetic screening panel 2017 with variant of unknown significance AXINA)  . COPD (chronic obstructive pulmonary disease) ([place]    pulmologist-- dr rDillard Essex- w/ chronic lingular scarring  (last exceratbation bronchiectasis 07/ 2020)  . Dyslipidemia    followed by cardiology/ vascular-- dr g. adams ([facility] [place])  family history strokes  . Eczema   . Family history of breast cancer   . Family history of colon cancer   . GERD (gastroesophageal reflux disease)   . Hiatal hernia   . History of esophageal stricture    s/p dilatation 2008  . History of external beam radiation therapy    left breast 12-19-2017  to 01-09-2018  . History of hyperthyroidism    endocrinologist-- (lov 04-11-2019 epic) dr eLoanne Drilling dx 2009 due to multinodular goiter ,  had taken medication for few months 2010 stopped due to norma TFT;    . Insomnia   . Multinodular goiter last thyroid ultrasound in epic 12-18-2018   endocrinologist-- dr eLoanne Drilling  dx 2009 w/ hyperthroidism with medication until  normal TFT in 2010;    . Osteopenia 02/2018   T score -2.4 distal third of radius.  FRAX 11% / 1.8% stable at other points of interest to include spine, right and left hip  . Personal history of colonic polyps 09/06/2001   hyperplastic  . Pityriasis rosea   . PONV (postoperative nausea and vomiting)    severe  . Primary osteoarthritis of left knee    Severe Patellofemoral arthritis  . 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 Wears contact lenses    Past Surgical History:  Procedure Laterality Date   . BREAST LUMPECTOMY WITH RADIOACTIVE SEED AND SENTINEL LYMPH NODE BIOPSY Left 11/02/2017   Procedure: LEFT BREAST LUMPECTOMY WITH RADIOACTIVE SEED AND LEFT AXILLARY DEEP SENTINEL LYMPH NODE BIOPSY, INJECT BLUE DYE LEFT BREAST;  Surgeon: [name], MD;  Location: [place];  Service: General;  Laterality: Left;  . BREAST SURGERY  2000   Breast cyst removed, left  . LAPAROSCOPIC BILATERAL SALPINGO OOPHERECTOMY Bilateral 10/13/2019   Procedure: LAPAROSCOPIC BILATERAL SALPINGO OOPHORECTOMY LYSIS OF ADHESIONS PERITONEAL WASHINGS ;  Surgeon: [name], MD;  Location: [place];  Service: Gynecology;  Laterality: Bilateral;  request 7:30am OR time in [place] [facility] block requests one hour  . TONSILLECTOMY  age 54  . TUBAL LIGATION Bilateral yrs ago  . VAGINAL HYSTERECTOMY  1991   Right ovarian cystectomy  . VIDEO BRONCHOSCOPY Bilateral 03/16/2015   Procedure: VIDEO BRONCHOSCOPY WITHOUT FLUORO;  Surgeon: Brand Males, MD;  Location: [facility] ENDOSCOPY;  Service: Endoscopy;  Laterality: Bilateral;   Family History  Problem Relation Age of Onset  . Osteoporosis Mother   . Lung cancer Mother   . Stroke Mother   . Diabetes Father   . Hypertension Father   . Osteoporosis Father   . Colon cancer Father 6  . Breast cancer Sister        Age 63  . Ovarian cancer Maternal Aunt 60  . Breast cancer Cousin        maternal-Age 41  . Colon cancer Paternal Uncle 76  . Colon cancer Cousin        maternal first cousin  . Breast cancer Maternal Aunt        great aunt- Age unknown  . Crohn's disease Son   . Leukemia Maternal Grandfather    Social History   Socioeconomic History  . Marital status: Single    Spouse name: Not on file  . Number of children: 2  . Years of education: Not on file  . Highest education level: Not on file  Occupational History  . Occupation: Pharmacist, hospital, works part Financial trader: [employer]  Tobacco Use  . Smoking  status: Former Smoker    Packs/day: 0.10    Years: 30.00    Pack years: 3.00    Quit date: 10/31/1995    Years since quitting: 24.1  . Smokeless tobacco: Never Used  Substance and Sexual Activity  . Alcohol use: Yes    Alcohol/week: 0.0 standard drinks    Comment: seldom  . Drug use: No  . Sexual activity: Not Currently    Birth control/protection: Surgical    Comment: 1st intercourse 74 yo-Fewer than 5 partners  Other Topics Concern  . Not on file  Social History Narrative   Lives by herself   2 children, one has mental issues    Social Determinants of Health  Financial Resource Strain: Low Risk   . Difficulty of Paying Living Expenses: Not hard at all  Food Insecurity: No Food Insecurity  . Worried About Charity fundraiser in the Last Year: Never true  . Ran Out of Food in the Last Year: Never true  Transportation Needs: No Transportation Needs  . Lack of Transportation (Medical): No  . Lack of Transportation (Non-Medical): No  Physical Activity:   . Days of Exercise per Week: Not on file  . Minutes of Exercise per Session: Not on file  Stress:   . Feeling of Stress : Not on file  Social Connections:   . Frequency of Communication with Friends and Family: Not on file  . Frequency of Social Gatherings with Friends and Family: Not on file  . Attends Religious Services: Not on file  . Active Member of Clubs or Organizations: Not on file  . Attends Archivist Meetings: Not on file  . Marital Status: Not on file    Outpatient Encounter Medications as of 01/02/2020  Medication Sig  . aspirin EC 81 MG tablet Take 81 mg by mouth daily.  . B Complex-C (B-COMPLEX WITH VITAMIN C) tablet Take 1 tablet by mouth daily.  . cetirizine (ZYRTEC) 10 MG tablet Take 10 mg by mouth daily.  . Cholecalciferol (VITAMIN D) 2000 units CAPS Take 2,000 Units by mouth daily.  . clorazepate (TRANXENE) 7.5 MG tablet TAKE 1 TABLET (7.5 MG TOTAL) BY MOUTH AT BEDTIME AS NEEDED FOR ANXIETY.   . famotidine (PEPCID) 20 MG tablet Take 1 tablet (20 mg total) by mouth 2 (two) times daily. (Patient taking differently: Take 20 mg by mouth at bedtime. )  . guaiFENesin-dextromethorphan (ROBITUSSIN DM) 100-10 MG/5ML syrup Take 5 mLs by mouth every 4 (four) hours as needed for cough. Every 4 - 6 hours as needed.  Marland Kitchen omeprazole (PRILOSEC) 40 MG capsule Take 1 capsule (40 mg total) by mouth daily. (Patient taking differently: Take 40 mg by mouth daily. )  . promethazine (PHENERGAN) 25 MG suppository Place 1 suppository (25 mg total) rectally every 12 (twelve) hours as needed for nausea or vomiting.  Marland Kitchen Respiratory Therapy Supplies (FLUTTER) DEVI 1 Units by Does not apply route as needed.  . [DISCONTINUED] clotrimazole (CLOTRIMAZOLE ANTI-FUNGAL) 1 % cream Apply 1 application topically 2 (two) times daily. (Patient not taking: Reported on 12/26/2019)  . [DISCONTINUED] methylPREDNISolone (MEDROL DOSEPAK) 4 MG TBPK tablet Medrol dose pack taper dose # 21 pills (Patient not taking: Reported on 12/26/2019)  . [DISCONTINUED] traMADol (ULTRAM) 50 MG tablet Take 1 tablet (50 mg total) by mouth every 6 (six) hours as needed. (Patient not taking: Reported on 12/26/2019)  . [DISCONTINUED] triamcinolone cream (KENALOG) 0.1 % Apply 1 application topically 2 (two) times daily. (Patient not taking: Reported on 12/26/2019)   No facility-administered encounter medications on file as of 01/02/2020.    Activities of Daily Living In your present state of health, do you have any difficulty performing the following activities: 01/02/2020 10/13/2019  Hearing? N -  Vision? N -  Difficulty concentrating or making decisions? N -  Walking or climbing stairs? N -  Comment - -  Dressing or bathing? N -  Doing errands, shopping? N N  Preparing Food and eating ? N -  Using the Toilet? N -  In the past six months, have you accidently leaked urine? N -  Do you have problems with loss of bowel control? N -  Managing your Medications?  N -  Managing your Finances? N -  Housekeeping or managing your Housekeeping? N -  Some recent data might be hidden    Patient Care Team: Colon Branch, MD as PCP - Gaston Islam, MD as Consulting Physician (Orthopedic Surgery) Nicholas Lose, MD as Consulting Physician (Hematology and Oncology) [name], MD as Consulting Physician (General Surgery) Fontaine, Belinda Block, MD as Consulting Physician (Gynecology) Brand Males, MD as Consulting Physician (Pulmonary Disease) Kyung Rudd, MD as Consulting Physician (Radiation Oncology) Gardenia Phlegm, NP as Nurse Practitioner (Hematology and Oncology) Ardis Hughs, MD as Attending Physician (Urology)    Assessment:   This is a routine wellness examination for Lenora. Physical assessment deferred to PCP.  Exercise Activities and Dietary recommendations Current Exercise Habits: The patient does not participate in regular exercise at present, Exercise limited by: None identified Diet (meal preparation, eat out, water intake, caffeinated beverages, dairy products, fruits and vegetables): in general, a "healthy" diet  , well balanced  Pt eats a plant based diet.    Goals    . Begin exercising (pt-stated)    . Continue working!       Fall Risk Fall Risk  01/02/2020 12/26/2019 12/16/2018 02/14/2018 12/14/2017  Falls in the past year? 0 0 0 No No  Number falls in past yr: 0 0 - - -  Injury with Fall? 0 0 - - -  Follow up Education provided;Falls prevention discussed Falls evaluation completed - - -     Depression Screen PHQ 2/9 Scores 01/02/2020 12/26/2019 12/16/2018 02/14/2018  PHQ - 2 Score 0 0 0 0     Cognitive Function Ad8 score reviewed for issues:  Issues making decisions:no  Less interest in hobbies / activities:no  Repeats questions, stories (family complaining):no  Trouble using ordinary gadgets (microwave, computer, phone):no  Forgets the month or year: no  Mismanaging finances:  no  Remembering appts:no  Daily problems with thinking and/or memory:no Ad8 score is=0     MMSE - Mini Mental State Exam 12/08/2016  Orientation to time 5  Orientation to Place 5  Registration 3  Attention/ Calculation 5  Recall 3  Language- name 2 objects 2  Language- repeat 1  Language- follow 3 step command 3  Language- read & follow direction 1  Write a sentence 1  Copy design 1  Total score 30        Immunization History  Administered Date(s) Administered  . Influenza Split 08/31/2011, 07/30/2014  . Influenza Whole 08/30/2009, 07/30/2010  . Influenza, High Dose Seasonal PF 07/05/2018, 08/02/2019  . Influenza,inj,Quad PF,6+ Mos 09/30/2012, 10/10/2013, 07/26/2015  . Influenza-Unspecified 08/18/2016  . Moderna SARS-COVID-2 Vaccination 11/29/2019  . Pneumococcal Conjugate-13 10/10/2013  . Pneumococcal Polysaccharide-23 07/27/2015  . Td 12/02/2008  . Tdap 01/03/2019  . Zoster 12/02/2008  . Zoster Recombinat (Shingrix) 01/03/2019, 04/18/2019   Screening Tests Health Maintenance  Topic Date Due  . MAMMOGRAM  09/17/2020  . COLONOSCOPY  05/28/2024  . TETANUS/TDAP  01/02/2029  . INFLUENZA VACCINE  Completed  . DEXA SCAN  Completed  . Hepatitis C Screening  Completed  . PNA vac Low Risk Adult  Completed     Plan:   See you next year!  Continue to eat heart healthy diet (full of fruits, vegetables, whole grains, lean protein, water--limit salt, fat, and sugar intake) and increase physical activity as tolerated.  Continue doing brain stimulating activities (puzzles, reading, adult coloring books, staying active) to keep memory sharp.     I have personally reviewed and  noted the following in the patient's chart:   . Medical and social history . Use of alcohol, tobacco or illicit drugs  . Current medications and supplements . Functional ability and status . Nutritional status . Physical activity . Advanced directives . List of other  physicians . Hospitalizations, surgeries, and ER visits in previous 12 months . Vitals . Screenings to include cognitive, depression, and falls . Referrals and appointments  In addition, I have reviewed and discussed with patient certain preventive protocols, quality metrics, and best practice recommendations. A written personalized care plan for preventive services as well as general preventive health recommendations were provided to patient.     Shela Nevin, [place]  01/02/2020

## 2020-01-02 ENCOUNTER — Ambulatory Visit (INDEPENDENT_AMBULATORY_CARE_PROVIDER_SITE_OTHER): Payer: Medicare PPO | Admitting: *Deleted

## 2020-01-02 ENCOUNTER — Other Ambulatory Visit: Payer: Self-pay

## 2020-01-02 ENCOUNTER — Encounter: Payer: Self-pay | Admitting: *Deleted

## 2020-01-02 ENCOUNTER — Encounter: Payer: Medicare Other | Admitting: Gynecology

## 2020-01-02 ENCOUNTER — Encounter: Payer: Medicare Other | Admitting: Obstetrics and Gynecology

## 2020-01-02 DIAGNOSIS — E7800 Pure hypercholesterolemia, unspecified: Secondary | ICD-10-CM | POA: Diagnosis not present

## 2020-01-02 DIAGNOSIS — Z0000 Encounter for general adult medical examination without abnormal findings: Secondary | ICD-10-CM | POA: Diagnosis not present

## 2020-01-02 NOTE — Patient Instructions (Signed)
See you next year!  Continue to eat heart healthy diet (full of fruits, vegetables, whole grains, lean protein, water--limit salt, fat, and sugar intake) and increase physical activity as tolerated.  Continue doing brain stimulating activities (puzzles, reading, adult coloring books, staying active) to keep memory sharp.    Cynthia Thornton , Thank you for taking time to come for your Medicare Wellness Visit. I appreciate your ongoing commitment to your health goals. Please review the following plan we discussed and let me know if I can assist you in the future.   These are the goals we discussed: Goals    . Begin exercising (pt-stated)    . Continue working!       This is a list of the screening recommended for you and due dates:  Health Maintenance  Topic Date Due  . Mammogram  09/17/2020  . Colon Cancer Screening  05/28/2024  . Tetanus Vaccine  01/02/2029  . Flu Shot  Completed  . DEXA scan (bone density measurement)  Completed  .  Hepatitis C: One time screening is recommended by Center for Disease Control  (CDC) for  adults born from [birth year] through 1963.   Completed  . Pneumonia vaccines  Completed    Preventive Care 42 Years and Older, Female Preventive care refers to lifestyle choices and visits with your health care provider that can promote health and wellness. This includes:  A yearly physical exam. This is also called an annual well check.  Regular dental and eye exams.  Immunizations.  Screening for certain conditions.  Healthy lifestyle choices, such as diet and exercise. What can I expect for my preventive care visit? Physical exam Your health care provider will check:  Height and weight. These may be used to calculate body mass index (BMI), which is a measurement that tells if you are at a healthy weight.  Heart rate and blood pressure.  Your skin for abnormal spots. Counseling Your health care provider may ask you questions about:  Alcohol, tobacco, and drug  use.  Emotional well-being.  Home and relationship well-being.  Sexual activity.  Eating habits.  History of falls.  Memory and ability to understand (cognition).  Work and work Statistician.  Pregnancy and menstrual history. What immunizations do I need?  Influenza (flu) vaccine  This is recommended every year. Tetanus, diphtheria, and pertussis (Tdap) vaccine  You may need a Td booster every 10 years. Varicella (chickenpox) vaccine  You may need this vaccine if you have not already been vaccinated. Zoster (shingles) vaccine  You may need this after age 58. Pneumococcal conjugate (PCV13) vaccine  One dose is recommended after age 56. Pneumococcal polysaccharide (PPSV23) vaccine  One dose is recommended after age 65. Measles, mumps, and rubella (MMR) vaccine  You may need at least one dose of MMR if you were born in 1957 or later. You may also need a second dose. Meningococcal conjugate (MenACWY) vaccine  You may need this if you have certain conditions. Hepatitis A vaccine  You may need this if you have certain conditions or if you travel or work in places where you may be exposed to hepatitis A. Hepatitis B vaccine  You may need this if you have certain conditions or if you travel or work in places where you may be exposed to hepatitis B. Haemophilus influenzae type b (Hib) vaccine  You may need this if you have certain conditions. You may receive vaccines as individual doses or as more than one vaccine together in one  shot (combination vaccines). Talk with your health care provider about the risks and benefits of combination vaccines. What tests do I need? Blood tests  Lipid and cholesterol levels. These may be checked every 5 years, or more frequently depending on your overall health.  Hepatitis C test.  Hepatitis B test. Screening  Lung cancer screening. You may have this screening every year starting at age 31 if you have a 30-pack-year history of  smoking and currently smoke or have quit within the past 15 years.  Colorectal cancer screening. All adults should have this screening starting at age 52 and continuing until age 25. Your health care provider may recommend screening at age 31 if you are at increased risk. You will have tests every 1-10 years, depending on your results and the type of screening test.  Diabetes screening. This is done by checking your blood sugar (glucose) after you have not eaten for a while (fasting). You may have this done every 1-3 years.  Mammogram. This may be done every 1-2 years. Talk with your health care provider about how often you should have regular mammograms.  BRCA-related cancer screening. This may be done if you have a family history of breast, ovarian, tubal, or peritoneal cancers. Other tests  Sexually transmitted disease (STD) testing.  Bone density scan. This is done to screen for osteoporosis. You may have this done starting at age 19. Follow these instructions at home: Eating and drinking  Eat a diet that includes fresh fruits and vegetables, whole grains, lean protein, and low-fat dairy products. Limit your intake of foods with high amounts of sugar, saturated fats, and salt.  Take vitamin and mineral supplements as recommended by your health care provider.  Do not drink alcohol if your health care provider tells you not to drink.  If you drink alcohol: ? Limit how much you have to 0-1 drink a day. ? Be aware of how much alcohol is in your drink. In the [place] one drink equals one 12 oz bottle of beer (355 mL), one 5 oz glass of wine (148 mL), or one 1 oz glass of hard liquor (44 mL). Lifestyle  Take daily care of your teeth and gums.  Stay active. Exercise for at least 30 minutes on 5 or more days each week.  Do not use any products that contain nicotine or tobacco, such as cigarettes, e-cigarettes, and chewing tobacco. If you need help quitting, ask your health care  provider.  If you are sexually active, practice safe sex. Use a condom or other form of protection in order to prevent STIs (sexually transmitted infections).  Talk with your health care provider about taking a low-dose aspirin or statin. What's next?  Go to your health care provider once a year for a well check visit.  Ask your health care provider how often you should have your eyes and teeth checked.  Stay up to date on all vaccines. This information is not intended to replace advice given to you by your health care provider. Make sure you discuss any questions you have with your health care provider. Document Revised: 10/10/2018 Document Reviewed: 10/10/2018 Elsevier Patient Education  2020 [employer].

## 2020-01-09 DIAGNOSIS — H9313 Tinnitus, bilateral: Secondary | ICD-10-CM | POA: Diagnosis not present

## 2020-01-09 DIAGNOSIS — R221 Localized swelling, mass and lump, neck: Secondary | ICD-10-CM | POA: Diagnosis not present

## 2020-01-19 DIAGNOSIS — H9313 Tinnitus, bilateral: Secondary | ICD-10-CM | POA: Diagnosis not present

## 2020-01-19 DIAGNOSIS — H9193 Unspecified hearing loss, bilateral: Secondary | ICD-10-CM | POA: Diagnosis not present

## 2020-02-25 ENCOUNTER — Telehealth: Payer: Self-pay | Admitting: Internal Medicine

## 2020-02-25 NOTE — Telephone Encounter (Signed)
Clorazepate refill.   Last OV: 12/26/2019 Last Fill: 10/16/2019 #30 and 3RF Pt sig: 1 tab qhs prn UDS: None

## 2020-02-25 NOTE — Telephone Encounter (Signed)
PDMP okay, Rx sent 

## 2020-03-25 DIAGNOSIS — R300 Dysuria: Secondary | ICD-10-CM | POA: Diagnosis not present

## 2020-03-27 DIAGNOSIS — B349 Viral infection, unspecified: Secondary | ICD-10-CM | POA: Diagnosis not present

## 2020-03-27 DIAGNOSIS — Z20822 Contact with and (suspected) exposure to covid-19: Secondary | ICD-10-CM | POA: Diagnosis not present

## 2020-04-01 ENCOUNTER — Other Ambulatory Visit: Payer: Self-pay

## 2020-04-02 ENCOUNTER — Ambulatory Visit: Payer: Medicare PPO | Admitting: Obstetrics & Gynecology

## 2020-04-02 ENCOUNTER — Encounter: Payer: Self-pay | Admitting: Obstetrics & Gynecology

## 2020-04-02 VITALS — BP 122/74 | Ht 63.25 in | Wt 141.0 lb

## 2020-04-02 DIAGNOSIS — Z9289 Personal history of other medical treatment: Secondary | ICD-10-CM

## 2020-04-02 DIAGNOSIS — Z853 Personal history of malignant neoplasm of breast: Secondary | ICD-10-CM

## 2020-04-02 DIAGNOSIS — C9110 Chronic lymphocytic leukemia of B-cell type not having achieved remission: Secondary | ICD-10-CM

## 2020-04-02 DIAGNOSIS — Z01419 Encounter for gynecological examination (general) (routine) without abnormal findings: Secondary | ICD-10-CM

## 2020-04-02 DIAGNOSIS — Z90710 Acquired absence of both cervix and uterus: Secondary | ICD-10-CM

## 2020-04-02 DIAGNOSIS — M810 Age-related osteoporosis without current pathological fracture: Secondary | ICD-10-CM

## 2020-04-02 DIAGNOSIS — Z9079 Acquired absence of other genital organ(s): Secondary | ICD-10-CM

## 2020-04-02 DIAGNOSIS — Z780 Asymptomatic menopausal state: Secondary | ICD-10-CM

## 2020-04-02 NOTE — Progress Notes (Addendum)
Cynthia Thornton 02-Mar-1946 [id number]   History:    74 y.o. G3P2A1L2    RP:  Established patient for Annual Gynecologic exam  HPI:  S/P Total Hysterectomy and then 09/2019 BSO.  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  H/O CLL.  Abstinent.  Breasts normal.  Urine normal.  Plant nutrition causing loose stools.  BMI 24.78.  Health labs with Dr Larose Kells.  [place] 2020.   Past medical history,surgical history, family history and social history were all reviewed and documented in the EPIC chart.  Gynecologic History No LMP recorded. Patient has had a hysterectomy.  Obstetric History OB History  Gravida Para Term Preterm AB Living  3 2 2   1 2   SAB TAB Ectopic Multiple Live Births               # Outcome Date GA Lbr Len/2nd Weight Sex Delivery Anes PTL Lv  3 AB           2 Term           1 Term              ROS: A ROS was performed and pertinent positives and negatives are included in the history.  GENERAL: No fevers or chills. HEENT: No change in vision, no earache, sore throat or sinus congestion. NECK: No pain or stiffness. CARDIOVASCULAR: No chest pain or pressure. No palpitations. PULMONARY: No shortness of breath, cough or wheeze. GASTROINTESTINAL: No abdominal pain, nausea, vomiting or diarrhea, melena or bright red blood per rectum. GENITOURINARY: No urinary frequency, urgency, hesitancy or dysuria. MUSCULOSKELETAL: No joint or muscle pain, no back pain, no recent trauma. DERMATOLOGIC: No rash, no itching, no lesions. ENDOCRINE: No polyuria, polydipsia, no heat or cold intolerance. No recent change in weight. HEMATOLOGICAL: No anemia or easy bruising or bleeding. NEUROLOGIC: No headache, seizures, numbness, tingling or weakness. PSYCHIATRIC: No depression, no loss of interest in normal activity or change in sleep pattern.     Exam:   BP 122/74   Ht 5' 3.25" (1.607 m)   Wt 141 lb (64 kg)   BMI 24.78 kg/m   Body mass index is 24.78 kg/m.  General  appearance : Well developed well nourished female. No acute distress HEENT: Eyes: no retinal hemorrhage or exudates,  Neck supple, trachea midline, no carotid bruits, no thyroidmegaly Lungs: Clear to auscultation, no rhonchi or wheezes, or rib retractions  Heart: Regular rate and rhythm, no murmurs or gallops Breast:Examined in sitting and supine position were symmetrical in appearance, no palpable masses or tenderness,  no skin retraction, no nipple inversion, no nipple discharge, no skin discoloration, no axillary or supraclavicular lymphadenopathy Abdomen: no palpable masses or tenderness, no rebound or guarding Extremities: no edema or skin discoloration or tenderness  Pelvic: Vulva: Normal             Vagina: No gross lesions or discharge  Cervix/Uterus absent  Adnexa  Without masses or tenderness  Anus: Normal with perianal irritation.   Assessment/Plan:  74 y.o. female for annual exam   1. Encounter for gynecological examination without abnormal finding Gynecologic exam status post total hysterectomy and BSO.  No indication to repeat a Pap test.  Breast exam normal.  Screening mammogram November 2020 was negative.  Colonoscopy 2020.  Health labs with Dr. Larose Kells.  Good body mass index at 24.78.  Recommend to increase aerobic activities to 5 times a week and light weightlifting every 2 days.  2. Status post total hysterectomy and bilateral salpingo-oophorectomy (BSO) BSO 09/2019 as patient is a carrier of a variant of unknown significant AXIN2.  3. Postmenopause Well on no HRT.  4. Age-related osteoporosis without current pathological fracture Osteoporosis on BD 02/2018.  Repeat BD here now.  Continue Vit D supplements, Ca++ intake of 1200 mg daily and regular weight bearing physical activities. - DG Bone Density; Future  5. 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  6. 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

## 2020-04-02 NOTE — Patient Instructions (Signed)
1. Encounter for gynecological examination without abnormal finding Gynecologic exam status post total hysterectomy and BSO.  No indication to repeat a Pap test.  Breast exam normal.  Screening mammogram November 2020 was negative.  Colonoscopy 2020.  Health labs with Dr. Larose Kells.  Good body mass index at 24.78.  Recommend to increase aerobic activities to 5 times a week and light weightlifting every 2 days.  2. Status post total hysterectomy and bilateral salpingo-oophorectomy (BSO) BSO 09/2019 as patient is a carrier of a variant of unknown significant AXIN2.  3. Postmenopause Well on no HRT.  4. Age-related osteoporosis without current pathological fracture Osteoporosis on BD 02/2018.  Repeat BD here now.  Continue Vit D supplements, Ca++ intake of 1200 mg daily and regular weight bearing physical activities. - DG Bone Density; Future  5. 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  6. 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

## 2020-04-16 ENCOUNTER — Ambulatory Visit: Payer: Medicare Other | Admitting: Endocrinology

## 2020-04-19 ENCOUNTER — Other Ambulatory Visit: Payer: Self-pay

## 2020-04-20 ENCOUNTER — Ambulatory Visit: Payer: Medicare PPO

## 2020-04-20 DIAGNOSIS — M810 Age-related osteoporosis without current pathological fracture: Secondary | ICD-10-CM

## 2020-04-23 ENCOUNTER — Other Ambulatory Visit: Payer: Self-pay

## 2020-04-23 ENCOUNTER — Emergency Department (HOSPITAL_COMMUNITY): Payer: Medicare PPO

## 2020-04-23 ENCOUNTER — Inpatient Hospital Stay (HOSPITAL_COMMUNITY)
Admit: 2020-04-23 | Discharge: 2020-04-28 | DRG: 872 | Disposition: A | Discharge status: Home / Self Care | Payer: Medicare PPO | Source: Ambulatory Visit | Attending: Internal Medicine | Admitting: Internal Medicine

## 2020-04-23 ENCOUNTER — Ambulatory Visit: Payer: Medicare PPO | Admitting: Endocrinology

## 2020-04-23 ENCOUNTER — Encounter: Payer: Self-pay | Admitting: Endocrinology

## 2020-04-23 ENCOUNTER — Encounter (HOSPITAL_COMMUNITY): Payer: Self-pay | Admitting: *Deleted

## 2020-04-23 VITALS — BP 116/70 | HR 101 | Ht 63.25 in | Wt 140.0 lb

## 2020-04-23 DIAGNOSIS — E785 Hyperlipidemia, unspecified: Secondary | ICD-10-CM | POA: Diagnosis present

## 2020-04-23 DIAGNOSIS — J479 Bronchiectasis, uncomplicated: Secondary | ICD-10-CM

## 2020-04-23 DIAGNOSIS — Z881 Allergy status to other antibiotic agents status: Secondary | ICD-10-CM

## 2020-04-23 DIAGNOSIS — E042 Nontoxic multinodular goiter: Secondary | ICD-10-CM

## 2020-04-23 DIAGNOSIS — R509 Fever, unspecified: Secondary | ICD-10-CM | POA: Diagnosis not present

## 2020-04-23 DIAGNOSIS — Z885 Allergy status to narcotic agent status: Secondary | ICD-10-CM

## 2020-04-23 DIAGNOSIS — E039 Hypothyroidism, unspecified: Secondary | ICD-10-CM | POA: Diagnosis present

## 2020-04-23 DIAGNOSIS — Z853 Personal history of malignant neoplasm of breast: Secondary | ICD-10-CM | POA: Diagnosis not present

## 2020-04-23 DIAGNOSIS — D509 Iron deficiency anemia, unspecified: Secondary | ICD-10-CM | POA: Diagnosis present

## 2020-04-23 DIAGNOSIS — Z888 Allergy status to other drugs, medicaments and biological substances status: Secondary | ICD-10-CM

## 2020-04-23 DIAGNOSIS — Z20822 Contact with and (suspected) exposure to covid-19: Secondary | ICD-10-CM | POA: Diagnosis present

## 2020-04-23 DIAGNOSIS — C50312 Malignant neoplasm of lower-inner quadrant of left female breast: Secondary | ICD-10-CM

## 2020-04-23 DIAGNOSIS — Z800 Family history of malignant neoplasm of digestive organs: Secondary | ICD-10-CM

## 2020-04-23 DIAGNOSIS — R059 Cough, unspecified: Secondary | ICD-10-CM

## 2020-04-23 DIAGNOSIS — Z79899 Other long term (current) drug therapy: Secondary | ICD-10-CM

## 2020-04-23 DIAGNOSIS — N3000 Acute cystitis without hematuria: Secondary | ICD-10-CM | POA: Diagnosis not present

## 2020-04-23 DIAGNOSIS — R05 Cough: Secondary | ICD-10-CM | POA: Diagnosis not present

## 2020-04-23 DIAGNOSIS — Z87891 Personal history of nicotine dependence: Secondary | ICD-10-CM

## 2020-04-23 DIAGNOSIS — Z8249 Family history of ischemic heart disease and other diseases of the circulatory system: Secondary | ICD-10-CM

## 2020-04-23 DIAGNOSIS — N10 Acute tubulo-interstitial nephritis: Secondary | ICD-10-CM

## 2020-04-23 DIAGNOSIS — I517 Cardiomegaly: Secondary | ICD-10-CM | POA: Diagnosis not present

## 2020-04-23 DIAGNOSIS — Z823 Family history of stroke: Secondary | ICD-10-CM

## 2020-04-23 DIAGNOSIS — I959 Hypotension, unspecified: Secondary | ICD-10-CM | POA: Diagnosis present

## 2020-04-23 DIAGNOSIS — Z833 Family history of diabetes mellitus: Secondary | ICD-10-CM

## 2020-04-23 DIAGNOSIS — A4151 Sepsis due to Escherichia coli [E. coli]: Secondary | ICD-10-CM | POA: Diagnosis present

## 2020-04-23 DIAGNOSIS — G8929 Other chronic pain: Secondary | ICD-10-CM | POA: Diagnosis present

## 2020-04-23 DIAGNOSIS — Z923 Personal history of irradiation: Secondary | ICD-10-CM

## 2020-04-23 DIAGNOSIS — Z170 Estrogen receptor positive status [ER+]: Secondary | ICD-10-CM | POA: Diagnosis not present

## 2020-04-23 DIAGNOSIS — K219 Gastro-esophageal reflux disease without esophagitis: Secondary | ICD-10-CM | POA: Diagnosis present

## 2020-04-23 DIAGNOSIS — M419 Scoliosis, unspecified: Secondary | ICD-10-CM | POA: Diagnosis present

## 2020-04-23 DIAGNOSIS — N390 Urinary tract infection, site not specified: Secondary | ICD-10-CM | POA: Diagnosis not present

## 2020-04-23 DIAGNOSIS — Z806 Family history of leukemia: Secondary | ICD-10-CM

## 2020-04-23 DIAGNOSIS — J449 Chronic obstructive pulmonary disease, unspecified: Secondary | ICD-10-CM | POA: Diagnosis not present

## 2020-04-23 DIAGNOSIS — Z882 Allergy status to sulfonamides status: Secondary | ICD-10-CM

## 2020-04-23 DIAGNOSIS — N2882 Megaloureter: Secondary | ICD-10-CM | POA: Diagnosis not present

## 2020-04-23 DIAGNOSIS — N12 Tubulo-interstitial nephritis, not specified as acute or chronic: Secondary | ICD-10-CM

## 2020-04-23 DIAGNOSIS — E876 Hypokalemia: Secondary | ICD-10-CM | POA: Diagnosis present

## 2020-04-23 DIAGNOSIS — Z90710 Acquired absence of both cervix and uterus: Secondary | ICD-10-CM

## 2020-04-23 DIAGNOSIS — R1084 Generalized abdominal pain: Secondary | ICD-10-CM | POA: Diagnosis not present

## 2020-04-23 DIAGNOSIS — Z8262 Family history of osteoporosis: Secondary | ICD-10-CM

## 2020-04-23 DIAGNOSIS — Z801 Family history of malignant neoplasm of trachea, bronchus and lung: Secondary | ICD-10-CM

## 2020-04-23 DIAGNOSIS — M8580 Other specified disorders of bone density and structure, unspecified site: Secondary | ICD-10-CM | POA: Diagnosis present

## 2020-04-23 DIAGNOSIS — Z8719 Personal history of other diseases of the digestive system: Secondary | ICD-10-CM

## 2020-04-23 DIAGNOSIS — E872 Acidosis: Secondary | ICD-10-CM | POA: Diagnosis present

## 2020-04-23 DIAGNOSIS — Z803 Family history of malignant neoplasm of breast: Secondary | ICD-10-CM

## 2020-04-23 DIAGNOSIS — N2889 Other specified disorders of kidney and ureter: Secondary | ICD-10-CM | POA: Diagnosis not present

## 2020-04-23 DIAGNOSIS — Z8041 Family history of malignant neoplasm of ovary: Secondary | ICD-10-CM

## 2020-04-23 DIAGNOSIS — K7689 Other specified diseases of liver: Secondary | ICD-10-CM | POA: Diagnosis not present

## 2020-04-23 DIAGNOSIS — D638 Anemia in other chronic diseases classified elsewhere: Secondary | ICD-10-CM | POA: Diagnosis present

## 2020-04-23 DIAGNOSIS — Z7982 Long term (current) use of aspirin: Secondary | ICD-10-CM

## 2020-04-23 DIAGNOSIS — R112 Nausea with vomiting, unspecified: Secondary | ICD-10-CM | POA: Diagnosis not present

## 2020-04-23 LAB — URINALYSIS, ROUTINE W REFLEX MICROSCOPIC
Bilirubin Urine: NEGATIVE
Glucose, UA: NEGATIVE mg/dL
Ketones, ur: NEGATIVE mg/dL
Nitrite: NEGATIVE
Protein, ur: 100 mg/dL — AB
Specific Gravity, Urine: 1.01 (ref 1.005–1.030)
WBC, UA: >50 WBC/hpf — ABNORMAL HIGH (ref 0–5)
pH: 6 (ref 5.0–8.0)

## 2020-04-23 LAB — BASIC METABOLIC PANEL
Anion gap: 11 (ref 5–15)
BUN: 18 mg/dL (ref 8–23)
CO2: 23 mmol/L (ref 22–32)
Calcium: 8.4 mg/dL — ABNORMAL LOW (ref 8.9–10.3)
Chloride: 103 mmol/L (ref 98–111)
Creatinine, Ser: 0.94 mg/dL (ref 0.44–1.00)
GFR calc Af Amer: >60 mL/min (ref >60)
GFR calc non Af Amer: >60 mL/min (ref >60)
Glucose, Bld: 106 mg/dL — ABNORMAL HIGH (ref 70–99)
Potassium: 3.7 mmol/L (ref 3.5–5.1)
Sodium: 137 mmol/L (ref 135–145)

## 2020-04-23 LAB — CBC WITH DIFFERENTIAL/PLATELET
Abs Immature Granulocytes: 0.03 10*3/uL (ref 0.00–0.07)
Basophils Absolute: 0 10*3/uL (ref 0.0–0.1)
Basophils Relative: 0 %
Eosinophils Absolute: 0 10*3/uL (ref 0.0–0.5)
Eosinophils Relative: 0 %
HCT: 32.1 % — ABNORMAL LOW (ref 36.0–46.0)
Hemoglobin: 11 g/dL — ABNORMAL LOW (ref 12.0–15.0)
Immature Granulocytes: 0 %
Lymphocytes Relative: 5 %
Lymphs Abs: 0.5 10*3/uL — ABNORMAL LOW (ref 0.7–4.0)
MCH: 30.7 pg (ref 26.0–34.0)
MCHC: 34.3 g/dL (ref 30.0–36.0)
MCV: 89.7 fL (ref 80.0–100.0)
Monocytes Absolute: 0.9 10*3/uL (ref 0.1–1.0)
Monocytes Relative: 10 %
Neutro Abs: 7.4 10*3/uL (ref 1.7–7.7)
Neutrophils Relative %: 85 %
Platelets: 206 10*3/uL (ref 150–400)
RBC: 3.58 MIL/uL — ABNORMAL LOW (ref 3.87–5.11)
RDW: 13.2 % (ref 11.5–15.5)
WBC: 8.8 10*3/uL (ref 4.0–10.5)
nRBC: 0 % (ref 0.0–0.2)

## 2020-04-23 LAB — TSH: TSH: 1.86 u[IU]/mL (ref 0.35–4.50)

## 2020-04-23 LAB — CBC
HCT: 29.8 % — ABNORMAL LOW (ref 36.0–46.0)
Hemoglobin: 10.3 g/dL — ABNORMAL LOW (ref 12.0–15.0)
MCH: 30.7 pg (ref 26.0–34.0)
MCHC: 34.6 g/dL (ref 30.0–36.0)
MCV: 89 fL (ref 80.0–100.0)
Platelets: 196 10*3/uL (ref 150–400)
RBC: 3.35 MIL/uL — ABNORMAL LOW (ref 3.87–5.11)
RDW: 13.2 % (ref 11.5–15.5)
WBC: 9.2 10*3/uL (ref 4.0–10.5)
nRBC: 0 % (ref 0.0–0.2)

## 2020-04-23 LAB — T4, FREE: Free T4: 0.89 ng/dL (ref 0.60–1.60)

## 2020-04-23 LAB — LACTIC ACID, PLASMA
Lactic Acid, Venous: 1.5 mmol/L (ref 0.5–1.9)
Lactic Acid, Venous: 0.9 mmol/L (ref 0.5–1.9)

## 2020-04-23 LAB — CREATININE, SERUM
Creatinine, Ser: 1.02 mg/dL — ABNORMAL HIGH (ref 0.44–1.00)
GFR calc Af Amer: >60 mL/min (ref >60)
GFR calc non Af Amer: 55 mL/min — ABNORMAL LOW (ref >60)

## 2020-04-23 LAB — SARS CORONAVIRUS 2 BY RT PCR (HOSPITAL ORDER, PERFORMED IN ~~LOC~~ HOSPITAL LAB): SARS Coronavirus 2: NEGATIVE

## 2020-04-23 LAB — CBG MONITORING, ED: Glucose-Capillary: 106 mg/dL — ABNORMAL HIGH (ref 70–99)

## 2020-04-23 MED ORDER — CLORAZEPATE DIPOTASSIUM 3.75 MG PO TABS
7.5000 mg | ORAL_TABLET | Freq: Every evening | ORAL | Status: DC | PRN
  Filled 2020-04-23: qty 2

## 2020-04-23 MED ORDER — IBUPROFEN 200 MG PO TABS: 400.0000 mg | ORAL_TABLET | Freq: Four times a day (QID) | ORAL | Status: DC | PRN

## 2020-04-23 MED ORDER — FAMOTIDINE 20 MG PO TABS
20.0000 mg | ORAL_TABLET | Freq: Every day | ORAL | Status: DC
  Administered 2020-04-23 – 2020-04-27 (×5): 20 mg via ORAL
  Filled 2020-04-23 (×5): qty 1

## 2020-04-23 MED ORDER — ONDANSETRON HCL 4 MG/2ML IJ SOLN
4.0000 mg | Freq: Four times a day (QID) | INTRAMUSCULAR | Status: DC | PRN
  Administered 2020-04-25 – 2020-04-26 (×2): 4 mg via INTRAVENOUS
  Filled 2020-04-23 (×2): qty 2

## 2020-04-23 MED ORDER — SODIUM CHLORIDE 0.9 % IV SOLN: INTRAVENOUS | Status: DC

## 2020-04-23 MED ORDER — SODIUM CHLORIDE 0.9 % IV BOLUS
500.0000 mL | Freq: Once | INTRAVENOUS | Status: AC
  Administered 2020-04-23: 500 mL via INTRAVENOUS

## 2020-04-23 MED ORDER — PANTOPRAZOLE SODIUM 40 MG PO TBEC
40.0000 mg | DELAYED_RELEASE_TABLET | Freq: Every day | ORAL | Status: DC
  Administered 2020-04-24 – 2020-04-25 (×2): 40 mg via ORAL
  Filled 2020-04-23 (×2): qty 1

## 2020-04-23 MED ORDER — ONDANSETRON HCL 4 MG PO TABS: 4.0000 mg | ORAL_TABLET | Freq: Four times a day (QID) | ORAL | Status: DC | PRN

## 2020-04-23 MED ORDER — LORATADINE 10 MG PO TABS
10.0000 mg | ORAL_TABLET | Freq: Every day | ORAL | Status: DC
  Administered 2020-04-24 – 2020-04-28 (×5): 10 mg via ORAL
  Filled 2020-04-23 (×5): qty 1

## 2020-04-23 MED ORDER — SODIUM CHLORIDE 0.9 % IV SOLN
1.0000 g | Freq: Once | INTRAVENOUS | Status: AC
  Administered 2020-04-23: 1 g via INTRAVENOUS
  Filled 2020-04-23: qty 10

## 2020-04-23 MED ORDER — SODIUM CHLORIDE 0.9 % IV BOLUS
1000.0000 mL | Freq: Once | INTRAVENOUS | Status: AC
  Administered 2020-04-23: 1000 mL via INTRAVENOUS

## 2020-04-23 MED ORDER — FLUTTER DEVI: 1.0000 [IU] | Freq: Every day | Status: DC

## 2020-04-23 MED ORDER — ENOXAPARIN SODIUM 40 MG/0.4ML ~~LOC~~ SOLN
40.0000 mg | Freq: Every day | SUBCUTANEOUS | Status: DC
  Administered 2020-04-23 – 2020-04-27 (×5): 40 mg via SUBCUTANEOUS
  Filled 2020-04-23 (×5): qty 0.4

## 2020-04-23 MED ORDER — SODIUM CHLORIDE 0.9 % IV SOLN
2.0000 g | INTRAVENOUS | Status: DC
  Administered 2020-04-23 – 2020-04-25 (×3): 2 g via INTRAVENOUS
  Filled 2020-04-23 (×3): qty 20

## 2020-04-23 MED ORDER — ASPIRIN EC 81 MG PO TBEC
81.0000 mg | DELAYED_RELEASE_TABLET | Freq: Every day | ORAL | Status: DC
  Administered 2020-04-24 – 2020-04-28 (×5): 81 mg via ORAL
  Filled 2020-04-23 (×5): qty 1

## 2020-04-23 MED ORDER — TRAMADOL HCL 50 MG PO TABS
50.0000 mg | ORAL_TABLET | Freq: Once | ORAL | Status: AC
  Administered 2020-04-23: 50 mg via ORAL
  Filled 2020-04-23: qty 1

## 2020-04-23 NOTE — Patient Instructions (Signed)
Blood tests are requested for you today.  We'll let you know about the results.  Please come back for a follow-up appointment in 1 year.  most of the time, a "lumpy thyroid" will eventually become overactive again.  this may take many years.

## 2020-04-23 NOTE — ED Triage Notes (Signed)
Per EMS, pt sent from [facility], was having vomiting/shaking while at appointment today  Also reporting right sided pain, worse with movement for the past week. Pt has hx of UTIs.  BP 98/60 HR 100 SpO2 95% RA CBG 114 RR 18

## 2020-04-23 NOTE — Progress Notes (Signed)
Subjective:    Patient ID: Cynthia Thornton, female    DOB: Sep 06, 1946, 74 y.o.   MRN: [id number]  HPI pt returns for f/u of hyperthyroidism (dx'ed 2009; [place] in 2020 showed multiple small nodules (not big enough to need bx or f/u), were unchanged; she took thionamide rx for a few months in 2010 for suppressed TSH, but she went off due to normal TFT).  she does not notice the goiter.  pt states she feels well in general.   Past Medical History:  Diagnosis Date  . Arthritis   . Breast cancer, left [facility]) oncologist-- dr [name]   dx 09-26-2017--- IDC, Stage IA, Grade 2, ER/PR +;  HER2 negative;  s/p  breast lumpectomy w/ node dissection 11-02-2017;  completed radiation 01-09-2018 (left breast genetic screening panel 2017 with variant of unknown significance AXINA)  . COPD (chronic obstructive pulmonary disease) ([place])    pulmologist-- dr Dillard Essex-- w/ chronic lingular scarring  (last exceratbation bronchiectasis 07/ 2020)  . Dyslipidemia    followed by cardiology/ vascular-- dr g. adams ([facility] in [place])  family history strokes  . Eczema   . Family history of breast cancer   . Family history of colon cancer   . GERD (gastroesophageal reflux disease)   . Hiatal hernia   . History of esophageal stricture    s/p dilatation 2008  . History of external beam radiation therapy    left breast 12-19-2017  to 01-09-2018  . History of hyperthyroidism    endocrinologist-- (lov 04-11-2019 epic) dr Loanne Drilling, dx 2009 due to multinodular goiter ,  had taken medication for few months 2010 stopped due to norma TFT;    . Insomnia   . Multinodular goiter last thyroid ultrasound in epic 12-18-2018   endocrinologist-- dr Loanne Drilling,  dx 2009 w/ hyperthroidism with medication until normal TFT in 2010;    . Osteopenia 02/2018   T score -2.4 distal third of radius.  FRAX 11% / 1.8% stable at other points of interest to include spine, right and left hip  . Personal history of colonic polyps 09/06/2001    hyperplastic  . Pityriasis rosea   . PONV (postoperative nausea and vomiting)    severe  . Primary osteoarthritis of left knee    Severe Patellofemoral arthritis  . 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 Wears contact lenses     Past Surgical History:  Procedure Laterality Date  . BREAST LUMPECTOMY WITH RADIOACTIVE SEED AND SENTINEL LYMPH NODE BIOPSY Left 11/02/2017   Procedure: LEFT BREAST LUMPECTOMY WITH RADIOACTIVE SEED AND LEFT AXILLARY DEEP SENTINEL LYMPH NODE BIOPSY, INJECT BLUE DYE LEFT BREAST;  Surgeon: [name], MD;  Location: [facility];  Service: General;  Laterality: Left;  . BREAST SURGERY  2000   Breast cyst removed, left  . LAPAROSCOPIC BILATERAL SALPINGO OOPHERECTOMY Bilateral 10/13/2019   Procedure: LAPAROSCOPIC BILATERAL SALPINGO OOPHORECTOMY LYSIS OF ADHESIONS PERITONEAL WASHINGS ;  Surgeon: [name], MD;  Location: [facility];  Service: Gynecology;  Laterality: Bilateral;  request 7:30am OR time in [place] [facility] block requests one hour  . TONSILLECTOMY  age 78  . TUBAL LIGATION Bilateral yrs ago  . VAGINAL HYSTERECTOMY  1991   Right ovarian cystectomy  . VIDEO BRONCHOSCOPY Bilateral 03/16/2015   Procedure: VIDEO BRONCHOSCOPY WITHOUT FLUORO;  Surgeon: Brand Males, MD;  Location[facility] END[facility]COPY;  Service: Endoscopy;  Laterality: Bilateral;    Social History  Socioeconomic History  . Marital status: Single    Spouse name: Not on file  . Number of children: 2  . Years of education: Not on file  . Highest education level: Not on file  Occupational History  . Occupation: Pharmacist, hospital, works part Financial trader: [employer]  Tobacco Use  . Smoking status: Former Smoker    Packs/day: 0.10    Years: 30.00    Pack years: 3.00    Quit date: 10/31/1995    Years since quitting: 24.4  . Smokeless tobacco: Never Used  Vaping Use  . Vaping Use: Never used    Substance and Sexual Activity  . Alcohol use: Yes    Alcohol/week: 0.0 standard drinks    Comment: seldom  . Drug use: No  . Sexual activity: Not Currently    Birth control/protection: Surgical    Comment: 1st intercourse 74 yo-Fewer than 5 partners  Other Topics Concern  . Not on file  Social History Narrative   Lives by herself   2 children, one has mental issues    Social Determinants of Health   Financial Resource Strain: Low Risk   . Difficulty of Paying Living Expenses: Not hard at all  Food Insecurity: No Food Insecurity  . Worried About Charity fundraiser in the Last Year: Never true  . Ran Out of Food in the Last Year: Never true  Transportation Needs: No Transportation Needs  . Lack of Transportation (Medical): No  . Lack of Transportation (Non-Medical): No  Physical Activity:   . Days of Exercise per Week:   . Minutes of Exercise per Session:   Stress:   . Feeling of Stress :   Social Connections:   . Frequency of Communication with Friends and Family:   . Frequency of Social Gatherings with Friends and Family:   . Attends Religious Services:   . Active Member of Clubs or Organizations:   . Attends Archivist Meetings:   Marland Kitchen Marital Status:   Intimate Partner Violence:   . Fear of Current or Ex-Partner:   . Emotionally Abused:   Marland Kitchen Physically Abused:   . Sexually Abused:     No current facility-administered medications on file prior to visit.   Current Outpatient Medications on File Prior to Visit  Medication Sig Dispense Refill  . aspirin EC 81 MG tablet Take 81 mg by mouth daily.    . B Complex-C (B-COMPLEX WITH VITAMIN C) tablet Take 1 tablet by mouth daily.    . cetirizine (ZYRTEC) 10 MG tablet Take 10 mg by mouth daily.    . Cholecalciferol (VITAMIN D) 2000 units CAPS Take 2,000 Units by mouth daily.    . clorazepate (TRANXENE) 7.5 MG tablet TAKE 1 TABLET (7.5 MG TOTAL) BY MOUTH AT BEDTIME AS NEEDED FOR ANXIETY. 30 tablet 2  . famotidine  (PEPCID) 20 MG tablet Take 1 tablet (20 mg total) by mouth 2 (two) times daily. (Patient taking differently: Take 20 mg by mouth at bedtime. ) 180 tablet 1  . omeprazole (PRILOSEC) 40 MG capsule Take 1 capsule (40 mg total) by mouth daily. (Patient taking differently: Take 40 mg by mouth daily. ) 90 capsule 3  . promethazine (PHENERGAN) 25 MG suppository Place 1 suppository (25 mg total) rectally every 12 (twelve) hours as needed for nausea or vomiting. (Patient not taking: Reported on 04/23/2020) 30 each 1  . Respiratory Therapy Supplies (FLUTTER) DEVI 1 Units by Does not apply route  as needed. 1 each 0    Allergies  Allergen Reactions  . Doxycycline Rash  . Other Other (See Comments)    Surgical glue : hives  . Oxycodone Nausea And Vomiting  . Sulfa Antibiotics Hives  . Trimethoprim Sulfate [Trimethoprim]     rash  . Codeine Nausea Only  . Propranolol Hcl Other (See Comments)    "Dizziness and heart racing"    Family History  Problem Relation Age of Onset  . Osteoporosis Mother   . Lung cancer Mother   . Stroke Mother   . Diabetes Father   . Hypertension Father   . Osteoporosis Father   . Colon cancer Father 98  . Breast cancer Sister        Age 30  . Ovarian cancer Maternal Aunt 60  . Breast cancer Cousin        maternal-Age 14  . Colon cancer Paternal Uncle 13  . Colon cancer Cousin        maternal first cousin  . Breast cancer Maternal Aunt        great aunt- Age unknown  . Crohn's disease Son   . Leukemia Maternal Grandfather     BP 116/70   Pulse (!) 101   Ht 5' 3.25" (1.607 m)   Wt 140 lb (63.5 kg)   SpO2 98%   BMI 24.60 kg/m    Review of Systems Denies neck pain.      Objective:   Physical Exam VITAL SIGNS:  See vs page GENERAL: no distress NECK: thyroid is slightly and diffusely enlarged.  No palpable nodule.     Lab Results  Component Value Date   TSH 1.86 04/23/2020   T4TOTAL 8.5 08/31/2011       Assessment & Plan:  Hyperthyroidism:  stable off rx, but she is at risk for recurrence Small MNG: clinically stable.   Patient Instructions  Blood tests are requested for you today.  We'll let you know about the results.  Please come back for a follow-up appointment in 1 year.  most of the time, a "lumpy thyroid" will eventually become overactive again.  this may take many years.

## 2020-04-23 NOTE — ED Notes (Signed)
Lab called stating the pts glucose level was 12. RN assessed pts CBG and sent new BMP for recollect. CBG 106. Pt denies any sx of hypoglycemia at this time.

## 2020-04-23 NOTE — ED Provider Notes (Signed)
[facility] [place] DEPT Provider Note   CSN: [id number] Arrival date & time: 04/23/20  1411     History No chief complaint on file.   Cynthia Thornton is a 74 y.o. female.  She has a history of breast cancer COPD urinary tract infections.  She is complaining of 1 week of noting cloudy urine and urinary frequency.  Started with low-grade temperature last evening 200.  Some right flank pain.  Went to urology today and was shaky nauseous and vomiting and had a low blood pressure letter.  Transferred here for evaluation.  No chest pain or shortness of breath.  No abdominal pain.  The history is provided by the patient.  Flank Pain This is a new problem. The current episode started yesterday. The problem occurs constantly. The problem has not changed since onset.Pertinent negatives include no chest pain, no abdominal pain, no headaches and no shortness of breath. Nothing aggravates the symptoms. Nothing relieves the symptoms. She has tried nothing for the symptoms. The treatment provided no relief.       Past Medical History:  Diagnosis Date   Arthritis    Breast cancer, left [facility]) oncologist-- dr [name]   dx 09-26-2017--- IDC, Stage IA, Grade 2, ER/PR +;  HER2 negative;  s/p  breast lumpectomy w/ node dissection 11-02-2017;  completed radiation 01-09-2018 (left breast genetic screening panel 2017 with variant of unknown significance AXINA)   COPD (chronic obstructive pulmonary disease) ([place])    pulmologist-- dr Dillard Essex-- w/ chronic lingular scarring  (last exceratbation bronchiectasis 07/ 2020)   Dyslipidemia    followed by cardiology/ vascular-- dr g. adams ([facility] in [place])  family history strokes   Eczema    Family history of breast cancer    Family history of colon cancer    GERD (gastroesophageal reflux disease)    Hiatal hernia    History of esophageal stricture    s/p dilatation 2008   History of external beam radiation  therapy    left breast 12-19-2017  to 01-09-2018   History of hyperthyroidism    endocrinologist-- (lov 04-11-2019 epic) dr Loanne Drilling, dx 2009 due to multinodular goiter ,  had taken medication for few months 2010 stopped due to norma TFT;     Insomnia    Multinodular goiter last thyroid ultrasound in epic 12-18-2018   endocrinologist-- dr Loanne Drilling,  dx 2009 w/ hyperthroidism with medication until normal TFT in 2010;     Osteopenia 02/2018   T score -2.4 distal third of radius.  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   Wears contact lenses     Patient Active Problem List   Diagnosis Date Noted   Tinnitus 06/27/2019   Bronchiectasis with (acute) exacerbation ([place]) 05/15/2019   Anxiety 03/29/2019   Insomnia 03/29/2019   Malignant neoplasm of lower-inner quadrant of left breast in female, estrogen receptor positive ([place]) 10/02/2017   Genetic testing 03/03/2016   Family history of breast cancer    Annual physical exam 07/27/2015   PCP NOTES >>>>>>> 07/27/2015   Smoking history 01/15/2015   Family hx of colon cancer  09/10/2014   Change in bowel habits 09/10/2014   Bronchiectasis ([place]) 07/28/2012   Osteopenia    DUB (dysfunctional uterine bleeding)    Hypercholesteremia 10/03/2010   GOITER, MULTINODULAR 11/27/2008   UNSPECIFIED ANEMIA 11/02/2008   Thyrotoxicosis 10/22/2008   COLONIC POLYPS, BENIGN 01/16/2008   DEGENERATIVE JOINT DISEASE 01/16/2008   ESOPHAGEAL STRICTURE 04/05/2007   Headache(784.0) 04/05/2007   HIATAL HERNIA 08/15/2006   GERD 07/30/2006    Past Surgical History:  Procedure Laterality Date     BREAST LUMPECTOMY WITH RADIOACTIVE SEED AND SENTINEL LYMPH NODE BIOPSY Left 11/02/2017   Procedure: LEFT BREAST LUMPECTOMY WITH RADIOACTIVE SEED AND LEFT AXILLARY DEEP SENTINEL LYMPH NODE BIOPSY, INJECT BLUE DYE LEFT BREAST;  Surgeon: [name], MD;  Location: [facility];  Service: General;  Laterality: Left;   BREAST SURGERY  2000   Breast cyst removed, left   LAPAROSCOPIC BILATERAL SALPINGO OOPHERECTOMY Bilateral 10/13/2019   Procedure: LAPAROSCOPIC BILATERAL SALPINGO OOPHORECTOMY LYSIS OF ADHESIONS PERITONEAL WASHINGS ;  Surgeon: [name], MD;  Location: [facility];  Service: Gynecology;  Laterality: Bilateral;  request 7:30am OR time in [place] block requests one hour   TONSILLECTOMY  age 74   TUBAL LIGATION Bilateral yrs ago   VAGINAL HYSTERECTOMY  1991   Right ovarian cystectomy   VIDEO BRONCHOSCOPY Bilateral 03/16/2015   Procedure: VIDEO BRONCHOSCOPY WITHOUT FLUORO;  Surgeon: Brand Males, MD;  Location: [facility];  Service: Endoscopy;  Laterality: Bilateral;     OB History    Gravida  3   Para  2   Term  2   Preterm      AB  1   Living  2     SAB      TAB      Ectopic      Multiple      Live Births              Family History  Problem Relation Age of Onset   Osteoporosis Mother    Lung cancer Mother    Stroke Mother    Diabetes Father    Hypertension Father    Osteoporosis Father    Colon cancer Father 49   Breast cancer Sister        Age 54   Ovarian cancer Maternal Aunt 20   Breast cancer Cousin        maternal-Age 89   Colon cancer Paternal Uncle 18   Colon cancer Cousin        maternal first cousin   Breast cancer Maternal Aunt        great aunt- Age unknown   Crohn's disease Son    Leukemia Maternal Grandfather     Social History   Tobacco Use   Smoking status: Former Smoker    Packs/day: 0.10    Years: 30.00    Pack years: 3.00    Quit date:  10/31/1995    Years since quitting: 24.4   Smokeless tobacco: Never Used  Vaping Use   Vaping Use: Never used  Substance Use Topics   Alcohol use: Yes    Alcohol/week: 0.0 standard drinks    Comment: seldom   Drug use: No    Home Medications Prior to Admission medications   Medication Sig Start Date End Date Taking? Authorizing Provider  aspirin EC 81 MG tablet Take 81 mg by mouth daily.    [provider]  B Complex-C (B-COMPLEX WITH VITAMIN C) tablet Take 1 tablet by mouth  daily.    [provider]  cetirizine (ZYRTEC) 10 MG tablet Take 10 mg by mouth daily.    [provider]  Cholecalciferol (VITAMIN D) 2000 units CAPS Take 2,000 Units by mouth daily.    [provider]  clorazepate (TRANXENE) 7.5 MG tablet TAKE 1 TABLET (7.5 MG TOTAL) BY MOUTH AT BEDTIME AS NEEDED FOR ANXIETY. 02/25/20   Colon Branch, MD  famotidine (PEPCID) 20 MG tablet Take 1 tablet (20 mg total) by mouth 2 (two) times daily. Patient taking differently: Take 20 mg by mouth at bedtime.  05/08/19   Pyrtle, Lajuan Lines, MD  guaiFENesin-dextromethorphan (ROBITUSSIN DM) 100-10 MG/5ML syrup Take 5 mLs by mouth every 4 (four) hours as needed for cough. Every 4 - 6 hours as needed.    [provider]  omeprazole (PRILOSEC) 40 MG capsule Take 1 capsule (40 mg total) by mouth daily. Patient taking differently: Take 40 mg by mouth daily.  07/11/19   Pyrtle, Lajuan Lines, MD  promethazine (PHENERGAN) 25 MG suppository Place 1 suppository (25 mg total) rectally every 12 (twelve) hours as needed for nausea or vomiting. 12/16/18   Colon Branch, MD  Respiratory Therapy Supplies (FLUTTER) DEVI 1 Units by Does not apply route as needed. 07/05/18   Brand Males, MD    Allergies    Doxycycline, Oxycodone, Trimethoprim sulfate [trimethoprim], Codeine, and Propranolol hcl  Review of Systems   Review of Systems  Constitutional: Positive for chills and fever.  HENT: Negative for sore throat.   Eyes:  Negative for visual disturbance.  Respiratory: Negative for shortness of breath.   Cardiovascular: Negative for chest pain.  Gastrointestinal: Positive for nausea and vomiting. Negative for abdominal pain.  Genitourinary: Positive for dysuria, flank pain and frequency.  Musculoskeletal: Positive for back pain.  Skin: Negative for rash.  Neurological: Negative for headaches.    Physical Exam Updated Vital Signs BP (!) 92/53    Pulse 99    Temp 99.9 F (37.7 C) (Oral)    Resp 19    SpO2 95%   Physical Exam Vitals and nursing note reviewed.  Constitutional:      General: She is not in acute distress.    Appearance: Normal appearance. She is well-developed.  HENT:     Head: Normocephalic and atraumatic.  Eyes:     Conjunctiva/sclera: Conjunctivae normal.  Cardiovascular:     Rate and Rhythm: Normal rate and regular rhythm.     Heart sounds: No murmur heard.   Pulmonary:     Effort: Pulmonary effort is normal. No respiratory distress.     Breath sounds: Normal breath sounds.  Abdominal:     Palpations: Abdomen is soft.     Tenderness: There is no abdominal tenderness. There is no guarding or rebound.  Musculoskeletal:        General: No deformity or signs of injury. Normal range of motion.     Cervical back: Normal range of motion and neck supple.  Skin:    General: Skin is warm and dry.     Capillary Refill: Capillary refill takes less than 2 seconds.  Neurological:     General: No focal deficit present.     Mental Status: She is alert and oriented to person, place, and time.     Sensory: No sensory deficit.     Motor: No weakness.     ED Results / Procedures / Treatments   Labs (all labs ordered are listed, but only abnormal results are  displayed) Labs Reviewed  URINE CULTURE - Abnormal; Notable for the following components:      Result Value   Culture >=100,000 COLONIES/mL GRAM NEGATIVE RODS (*)    All other components within normal limits  BLOOD CULTURE ID PANEL  (REFLEXED) - Abnormal; Notable for the following components:   Enterobacteriaceae species DETECTED (*)    Escherichia coli DETECTED (*)    All other components within normal limits  CBC WITH DIFFERENTIAL/PLATELET - Abnormal; Notable for the following components:   RBC 3.58 (*)    Hemoglobin 11.0 (*)    HCT 32.1 (*)    Lymphs Abs 0.5 (*)    All other components within normal limits  URINALYSIS, ROUTINE W REFLEX MICROSCOPIC - Abnormal; Notable for the following components:   APPearance CLOUDY (*)    Hgb urine dipstick MODERATE (*)    Protein, ur 100 (*)    Leukocytes,Ua LARGE (*)    WBC, UA >50 (*)    Bacteria, UA MANY (*)    All other components within normal limits  BASIC METABOLIC PANEL - Abnormal; Notable for the following components:   Glucose, Bld 106 (*)    Calcium 8.4 (*)    All other components within normal limits  CBC - Abnormal; Notable for the following components:   RBC 3.35 (*)    Hemoglobin 10.3 (*)    HCT 29.8 (*)    All other components within normal limits  CREATININE, SERUM - Abnormal; Notable for the following components:   Creatinine, Ser 1.02 (*)    GFR calc non Af Amer 55 (*)    All other components within normal limits  BASIC METABOLIC PANEL - Abnormal; Notable for the following components:   Potassium 3.1 (*)    Chloride 114 (*)    CO2 17 (*)    Calcium 6.8 (*)    All other components within normal limits  CBC - Abnormal; Notable for the following components:   RBC 2.75 (*)    Hemoglobin 8.4 (*)    HCT 25.4 (*)    All other components within normal limits  CBG MONITORING, ED - Abnormal; Notable for the following components:   Glucose-Capillary 106 (*)    All other components within normal limits  CULTURE, BLOOD (ROUTINE X 2)  CULTURE, BLOOD (ROUTINE X 2)  SARS CORONAVIRUS 2 BY RT PCR (HOSPITAL ORDER, [place] [facility])  LACTIC ACID, PLASMA  LACTIC ACID, PLASMA    EKG None  Radiology CT Renal Stone Study  Result  Date: 04/23/2020 CLINICAL DATA:  Worsening right flank pain over the last week with vomiting and shaking. History of urinary tract infections. Concern for calculus. EXAM: CT ABDOMEN AND PELVIS WITHOUT CONTRAST TECHNIQUE: Multidetector CT imaging of the abdomen and pelvis was performed following the standard protocol without IV contrast. COMPARISON:  Contrast-enhanced abdominopelvic CT 09/15/2014. FINDINGS: Lower chest: Stable bronchiectasis in the right middle lobe and lingula. The lung bases are otherwise clear. No significant pleural or pericardial effusion. There is a small hiatal hernia and mild aortic atherosclerosis. Hepatobiliary: There is a stable lobulated cyst in the left hepatic lobe measuring up to 3.7 cm on image 24/2. Other scattered smaller low-density lesions are grossly stable. No worrisome hepatic findings on noncontrast imaging. No evidence of gallstones, gallbladder wall thickening or biliary dilatation. Pancreas: Unremarkable. No pancreatic ductal dilatation or surrounding inflammatory changes. Spleen: Normal in size. Previously demonstrated low-density lesions are not well seen on this noncontrast study. Adrenals/Urinary Tract: Both adrenal glands  appear normal. No evidence of renal, ureteral or bladder calculus. There is mild asymmetric perinephric soft tissue stranding on the right without focal fluid collection. The right ureter is mildly dilated. The left kidney appears normal. The bladder is mildly distended without focal abnormality or surrounding inflammation. Stomach/Bowel: No evidence of bowel wall thickening, distention or surrounding inflammatory change. Small retrocecal appendix appears stable. There is moderate stool throughout the colon. Vascular/Lymphatic: There are no enlarged abdominal or pelvic lymph nodes. No significant vascular findings on noncontrast imaging. Reproductive: Hysterectomy.  No adnexal mass. Other: No evidence of abdominal wall mass or hernia. No ascites.  Musculoskeletal: No acute or significant osseous findings. Mild thoracolumbar scoliosis and spondylosis. Small sacral Tarlov cyst. IMPRESSION: 1. Mild asymmetric perinephric soft tissue stranding on the right with mild dilatation of the right ureter. No evidence of renal, ureteral or bladder calculus. Findings could be secondary to a recently passed calculus or urinary tract infection. Correlation with urine analysis recommended. 2. Stable hepatic cysts. Electronically Signed   By: Richardean Sale M.D.   On: 04/23/2020 17:54    Procedures Procedures (including critical care time)  Medications Ordered in ED Medications  aspirin EC tablet 81 mg (81 mg Oral Given 04/24/20 0932)  clorazepate (TRANXENE) tablet 7.5 mg (has no administration in time range)  famotidine (PEPCID) tablet 20 mg (20 mg Oral Given 04/23/20 2310)  pantoprazole (PROTONIX) EC tablet 40 mg (40 mg Oral Given 04/24/20 0932)  loratadine (CLARITIN) tablet 10 mg (10 mg Oral Given 04/24/20 0932)  enoxaparin (LOVENOX) injection 40 mg (40 mg Subcutaneous Given 04/23/20 2310)  0.9 %  sodium chloride infusion ( Intravenous Rate/Dose Change 04/24/20 0736)  ondansetron (ZOFRAN) tablet 4 mg (has no administration in time range)    Or  ondansetron (ZOFRAN) injection 4 mg (has no administration in time range)  cefTRIAXone (ROCEPHIN) 2 g in sodium chloride 0.9 % 100 mL IVPB (0 g Intravenous Stopped 04/24/20 0016)  acetaminophen (TYLENOL) tablet 650 mg (650 mg Oral Given 04/24/20 0808)  sodium chloride 0.9 % bolus 1,000 mL (0 mLs Intravenous Stopped 04/23/20 1833)  cefTRIAXone (ROCEPHIN) 1 g in sodium chloride 0.9 % 100 mL IVPB (0 g Intravenous Stopped 04/23/20 1604)  traMADol (ULTRAM) tablet 50 mg (50 mg Oral Given 04/23/20 1843)  sodium chloride 0.9 % bolus 500 mL (0 mLs Intravenous Stopped 04/24/20 0016)  sodium chloride 0.9 % bolus 500 mL (0 mLs Intravenous Stopped 04/24/20 0415)  potassium chloride SA (KLOR-CON) CR tablet 40 mEq (40 mEq Oral Given  04/24/20 0759)    ED Course  I have reviewed the triage vital signs and the nursing notes.  Pertinent labs & imaging results that were available during my care of the patient were reviewed by me and considered in my medical decision making (see chart for details).  Clinical Course as of Apr 24 1146  Fri Apr 23, 2020  1820 Reviewed work-up with Dr. Gloriann Loan urology.  He is recommending the patient be admitted to the hospital service for medical management of her pyelonephritis.  Patient in agreement with plan.  She still having significant flank pain and asked for some medication.  She said she is used tramadol in the past with good response.   [MB]  6213 Discussed with [facility] hospitalist who will evaluate the patient for admission.   [MB]    Clinical Course User Index [MB] Hayden Rasmussen, MD   MDM Rules/Calculators/A&P  This patient complains of urinary tract infection, nausea vomiting; this involves an extensive number of treatment Options and is a complaint that carries with it a high risk of complications and Morbidity. The differential includes sepsis, Sirs, pyelonephritis, UTI, metabolic derangement  I ordered, reviewed and interpreted labs, which included CBC with normal S[place] count, trending down hemoglobin, chemistries normal, lactate not elevated, urinalysis with signs of infection I ordered medication IV antibiotics and fluids I ordered imaging studies which included CT KUB and I independently    visualized and interpreted imaging which showed no gross ureteral stones Additional history obtained from patient's daughter Previous records obtained and reviewed in epic including last urology work-up I consulted Dr. Gloriann Loan urology and Dr. Kennon Rounds [facility] hospitalist and discussed lab and imaging findings  Critical Interventions: Identification of and management of patient's pyelonephritis and possible early bacteremia/SIRS  After the interventions stated above, I  reevaluated the patient and found patient still to have soft blood pressures although normal mental status.  Still having pain in her right flank.  Will need admission for continued IV antibiotics and management of her infection.   Final Clinical Impression(s) / ED Diagnoses Final diagnoses:  Pyelonephritis    Rx / DC Orders ED Discharge Orders    None       Hayden Rasmussen, MD 04/24/20 1150

## 2020-04-23 NOTE — H&P (Signed)
History and Physical    Cynthia Thornton XHB:[id number] DOB: 1946-10-29 DOA: 04/23/2020  PCP: Colon Branch, MD  Patient coming from: Home  I have personally briefly reviewed patient's old medical records in [place]  Chief Complaint: Urinary tract infection  HPI: Cynthia Thornton is a 74 y.o. female with medical history significant of breast cancer, bronchiectasis, hypothyroid, osteopenia who has a 1 month history of ongoing dysuria.  Was treated with Macrobid by urgent care with a negative urine culture approximately 4 weeks ago.  Had noticed pus in her urine.  Today developed kidney/back pain with increased urgency and frequency.  Got an appointment with urology was found to have rigors nausea and vomiting and fever.  She was sent in for IV antibiotics  ED Course: Was noted to have normal [place] count normal renal function but low BP.  Was given 1 L of fluid and a gram of Rocephin  We are asked to admit her for observation.  Review of Systems: As per HPI otherwise 10 point review of systems negative.   Past Medical History:  Diagnosis Date  . Arthritis   . Breast cancer, left [facility]) oncologist-- dr [name]   dx 09-26-2017--- IDC, Stage IA, Grade 2, ER/PR +;  HER2 negative;  s/p  breast lumpectomy w/ node dissection 11-02-2017;  completed radiation 01-09-2018 (left breast genetic screening panel 2017 with variant of unknown significance AXINA)  . COPD (chronic obstructive pulmonary disease) ([place])    pulmologist-- dr Dillard [place]-- w/ chronic lingular scarring  (last exceratbation bronchiectasis 07/ 2020)  . Dyslipidemia    followed by cardiology/ vascular-- dr g. adams ([facility] in [place])  family history strokes  . Eczema   . Family history of breast cancer   . Family history of colon cancer   . GERD (gastroesophageal reflux disease)   . Hiatal hernia   . History of esophageal stricture    s/p dilatation 2008  . History of external beam radiation therapy    left  breast 12-19-2017  to 01-09-2018  . History of hyperthyroidism    endocrinologist-- ([place] 04-11-2019 epic) dr Loanne Drilling, dx 2009 due to multinodular goiter ,  had taken medication for few months 2010 stopped due to norma TFT;    . Insomnia   . Multinodular goiter last thyroid ultrasound in epic 12-18-2018   endocrinologist-- dr Loanne Drilling,  dx 2009 w/ hyperthroidism with medication until normal TFT in 2010;    . Osteopenia 02/2018   T score -2.4 distal third of radius.  FRAX 11% / 1.8% stable at other points of interest to include spine, right and left hip  . Personal history of colonic polyps 09/06/2001   hyperplastic  . Pityriasis rosea   . PONV (postoperative nausea and vomiting)    severe  . Primary osteoarthritis of left knee    Severe Patellofemoral arthritis  . 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 Wears contact lenses     Past Surgical History:  Procedure Laterality Date  . BREAST LUMPECTOMY WITH RADIOACTIVE SEED AND SENTINEL LYMPH NODE BIOPSY Left 11/02/2017   Procedure: LEFT BREAST LUMPECTOMY WITH RADIOACTIVE SEED AND LEFT AXILLARY DEEP SENTINEL LYMPH NODE BIOPSY, INJECT BLUE DYE LEFT BREAST;  Surgeon: [name], MD;  Location: [place];  Service: General;  Laterality: Left;  . BREAST SURGERY  2000   Breast cyst removed, left  . LAPAROSCOPIC BILATERAL SALPINGO OOPHERECTOMY Bilateral 10/13/2019  Procedure: LAPAROSCOPIC BILATERAL SALPINGO OOPHORECTOMY LYSIS OF ADHESIONS PERITONEAL WASHINGS ;  Surgeon: [name], MD;  Location: [facility];  Service: Gynecology;  Laterality: Bilateral;  request 7:30am OR time in [facility] block requests one hour  . TONSILLECTOMY  age 55  . TUBAL LIGATION Bilateral yrs ago  . VAGINAL HYSTERECTOMY  1991   Right ovarian cystectomy  . VIDEO BRONCHOSCOPY Bilateral 03/16/2015   Procedure: VIDEO BRONCHOSCOPY WITHOUT FLUORO;  Surgeon: Brand Males, MD;   Location[facility] ENDOSCOPY;  Service: Endoscopy;  Laterality: Bilateral;     reports that she quit smoking about 24 years ago. She has a 3.00 pack-year smoking history. She has never used smokeless tobacco. She reports current alcohol use. She reports that she does not use drugs.  Allergies  Allergen Reactions  . Doxycycline Rash  . Other Other (See Comments)    Surgical glue : hives  . Oxycodone Nausea And Vomiting  . Sulfa Antibiotics Hives  . Trimethoprim Sulfate [Trimethoprim]     rash  . Codeine Nausea Only  . Propranolol Hcl Other (See Comments)    "Dizziness and heart racing"    Family History  Problem Relation Age of Onset  . Osteoporosis Mother   . Lung cancer Mother   . Stroke Mother   . Diabetes Father   . Hypertension Father   . Osteoporosis Father   . Colon cancer Father 45  . Breast cancer Sister        Age 5  . Ovarian cancer Maternal Aunt 60  . Breast cancer Cousin        maternal-Age 73  . Colon cancer Paternal Uncle 24  . Colon cancer Cousin        maternal first cousin  . Breast cancer Maternal Aunt        great aunt- Age unknown  . Crohn's disease Son   . Leukemia Maternal Grandfather      Prior to Admission medications   Medication Sig Start Date End Date Taking? Authorizing Provider  aspirin EC 81 MG tablet Take 81 mg by mouth daily.   Yes [provider]  B Complex-C (B-COMPLEX WITH VITAMIN C) tablet Take 1 tablet by mouth daily.   Yes [provider]  cetirizine (ZYRTEC) 10 MG tablet Take 10 mg by mouth daily.   Yes [provider]  Cholecalciferol (VITAMIN D) 2000 units CAPS Take 2,000 Units by mouth daily.   Yes [provider]  clorazepate (TRANXENE) 7.5 MG tablet TAKE 1 TABLET (7.5 MG TOTAL) BY MOUTH AT BEDTIME AS NEEDED FOR ANXIETY. 02/25/20  Yes [name], [name], MD  famotidine (PEPCID) 20 MG tablet Take 1 tablet (20 mg total) by mouth 2 (two) times daily. Patient taking differently: Take 20 mg by mouth at  bedtime.  05/08/19  Yes Pyrtle, Lajuan Lines, MD  omeprazole (PRILOSEC) 40 MG capsule Take 1 capsule (40 mg total) by mouth daily. Patient taking differently: Take 40 mg by mouth daily.  07/11/19  Yes Pyrtle, Lajuan Lines, MD  promethazine (PHENERGAN) 25 MG suppository Place 1 suppository (25 mg total) rectally every 12 (twelve) hours as needed for nausea or vomiting. Patient not taking: Reported on 04/23/2020 12/16/18   Colon Branch, MD  Respiratory Therapy Supplies (FLUTTER) DEVI 1 Units by Does not apply route as needed. 07/05/18   Brand Males, MD    Physical Exam: Vitals:   04/23/20 1427 04/23/20 1602 04/23/20 1930 04/23/20 2030  BP: (!) 100/53 (!) 103/58 99/61 94/60  Pulse: 95 93 (!) 108 (!) 105  Resp:  16 (!) 26 (!) 23  Temp: 99.9 F (37.7 C)     TempSrc: Oral     SpO2: 99% 97% 95% 96%    Constitutional: NAD, calm, comfortable Vitals:   04/23/20 1427 04/23/20 1602 04/23/20 1930 04/23/20 2030  BP: (!) 100/53 (!) 103/58 99/61 94/60   Pulse: 95 93 (!) 108 (!) 105  Resp:  16 (!) 26 (!) 23  Temp: 99.9 F (37.7 C)     TempSrc: Oral     SpO2: 99% 97% 95% 96%   Eyes: PERRL, lids and conjunctivae normal ENMT: Mucous membranes are moist. Posterior pharynx clear of any exudate or lesions.Normal dentition.  Neck: normal, supple, no masses, no thyromegaly Respiratory: clear to auscultation bilaterally, no wheezing, no crackles. Normal respiratory effort. No accessory muscle use.  Cardiovascular: Regular rate and rhythm, no murmurs / rubs / gallops. No extremity edema. 2+ pedal pulses. No carotid bruits.  Abdomen: no tenderness, no masses palpated. No hepatosplenomegaly. Bowel sounds positive.  Musculoskeletal: no clubbing / cyanosis. No joint deformity upper and lower extremities. Good ROM, no contractures. Normal muscle tone.  Skin: no rashes, lesions, ulcers. No induration Neurologic: CN 2-12 grossly intact. Sensation intact, DTR normal. Strength 5/5 in all 4.  Psychiatric: Normal judgment and  insight. Alert and oriented x 3. Normal mood.   Labs on Admission: I have personally reviewed following labs and imaging studies  CBC: Recent Labs  Lab 04/23/20 1504  WBC 8.8  NEUTROABS 7.4  HGB 11.0*  HCT 32.1*  MCV 89.7  PLT 060   Basic Metabolic Panel: Recent Labs  Lab 04/23/20 1615  NA 137  K 3.7  CL 103  CO2 23  GLUCOSE 106*  BUN 18  CREATININE 0.94  CALCIUM 8.4*   GFR: Estimated Creatinine Clearance: 45.3 mL/min (by C-G formula based on SCr of 0.94 mg/dL). Liver Function Tests: No results for input(s): AST, ALT, ALKPHOS, BILITOT, PROT, ALBUMIN in the last 168 hours. No results for input(s): LIPASE, AMYLASE in the last 168 hours. No results for input(s): AMMONIA in the last 168 hours. Coagulation Profile: No results for input(s): INR, PROTIME in the last 168 hours. Cardiac Enzymes: No results for input(s): CKTOTAL, CKMB, CKMBINDEX, TROPONINI in the last 168 hours. BNP (last 3 results) No results for input(s): PROBNP in the last 8760 hours. HbA1C: No results for input(s): HGBA1C in the last 72 hours. CBG: Recent Labs  Lab 04/23/20 1555  GLUCAP 106*   Lipid Profile: No results for input(s): CHOL, HDL, LDLCALC, TRIG, CHOLHDL, LDLDIRECT in the last 72 hours. Thyroid Function Tests: Recent Labs    04/23/20 0916  TSH 1.86  FREET4 0.89   Anemia Panel: No results for input(s): VITAMINB12, FOLATE, FERRITIN, TIBC, IRON, RETICCTPCT in the last 72 hours. Urine analysis:    Component Value Date/Time   COLORURINE YELLOW 04/23/2020 1440   APPEARANCEUR CLOUDY (A) 04/23/2020 1440   LABSPEC 1.010 04/23/2020 1440   PHURINE 6.0 04/23/2020 1440   GLUCOSEU NEGATIVE 04/23/2020 1440   HGBUR MODERATE (A) 04/23/2020 1440   HGBUR moderate 10/14/2008 1615   BILIRUBINUR NEGATIVE 04/23/2020 1440   BILIRUBINUR Negative 09/04/2018 1643   KETONESUR NEGATIVE 04/23/2020 1440   PROTEINUR 100 (A) 04/23/2020 1440   UROBILINOGEN negative (A) 09/04/2018 1643   UROBILINOGEN 0.2  11/17/2013 1624   NITRITE NEGATIVE 04/23/2020 1440   LEUKOCYTESUR LARGE (A) 04/23/2020 1440    Radiological Exams on Admission: CT Renal Stone Study  Result Date:  04/23/2020 CLINICAL DATA:  Worsening right flank pain over the last week with vomiting and shaking. History of urinary tract infections. Concern for calculus. EXAM: CT ABDOMEN AND PELVIS WITHOUT CONTRAST TECHNIQUE: Multidetector CT imaging of the abdomen and pelvis was performed following the standard protocol without IV contrast. COMPARISON:  Contrast-enhanced abdominopelvic CT 09/15/2014. FINDINGS: Lower chest: Stable bronchiectasis in the right middle lobe and lingula. The lung bases are otherwise clear. No significant pleural or pericardial effusion. There is a small hiatal hernia and mild aortic atherosclerosis. Hepatobiliary: There is a stable lobulated cyst in the left hepatic lobe measuring up to 3.7 cm on image 24/2. Other scattered smaller low-density lesions are grossly stable. No worrisome hepatic findings on noncontrast imaging. No evidence of gallstones, gallbladder wall thickening or biliary dilatation. Pancreas: Unremarkable. No pancreatic ductal dilatation or surrounding inflammatory changes. Spleen: Normal in size. Previously demonstrated low-density lesions are not well seen on this noncontrast study. Adrenals/Urinary Tract: Both adrenal glands appear normal. No evidence of renal, ureteral or bladder calculus. There is mild asymmetric perinephric soft tissue stranding on the right without focal fluid collection. The right ureter is mildly dilated. The left kidney appears normal. The bladder is mildly distended without focal abnormality or surrounding inflammation. Stomach/Bowel: No evidence of bowel wall thickening, distention or surrounding inflammatory change. Small retrocecal appendix appears stable. There is moderate stool throughout the colon. Vascular/Lymphatic: There are no enlarged abdominal or pelvic lymph nodes. No  significant vascular findings on noncontrast imaging. Reproductive: Hysterectomy.  No adnexal mass. Other: No evidence of abdominal wall mass or hernia. No ascites. Musculoskeletal: No acute or significant osseous findings. Mild thoracolumbar scoliosis and spondylosis. Small sacral Tarlov cyst. IMPRESSION: 1. Mild asymmetric perinephric soft tissue stranding on the right with mild dilatation of the right ureter. No evidence of renal, ureteral or bladder calculus. Findings could be secondary to a recently passed calculus or urinary tract infection. Correlation with urine analysis recommended. 2. Stable hepatic cysts. Electronically Signed   By: Richardean Sale M.D.   On: 04/23/2020 17:54    Assessment/Plan Principal Problem:   Acute pyelonephritis Active Problems:   GERD   Osteopenia   Bronchiectasis (HCC)   Malignant neoplasm of lower-inner quadrant of left breast in female, estrogen receptor positive ([place])  Acute pyelonephritis -no evidence of sepsis at present with a lactic acid of 1.5.  CT reveals perinephric soft tissue stranding on the right and mild dilation of the right ureter. Rocephin 2 g every 24, may send home if doing well in 24 to 48 hours. Culture urine -previous cultures reveal Klebsiella, and E. coli both of which were resistant to amoxicillin and Macrobid.  Bronchiectasis - continue flutter valve Daily antihistamine  GERD - continue PPI and H2 blocker  History of breast cancer -no further treatment at this time.  DVT prophylaxis: Lovenox SQ Code Status: Full code  Family Communication: Daughter-in-law at bedside Disposition Plan: Home Consults called: None Admission status: Observation patient is anxious to be home before her Monday classes start   Donnamae Jude MD [facility]  If 7PM-7AM, please contact night-coverage 04/23/2020, 9:34 PM

## 2020-04-24 DIAGNOSIS — Z881 Allergy status to other antibiotic agents status: Secondary | ICD-10-CM | POA: Diagnosis not present

## 2020-04-24 DIAGNOSIS — Z800 Family history of malignant neoplasm of digestive organs: Secondary | ICD-10-CM | POA: Diagnosis not present

## 2020-04-24 DIAGNOSIS — J479 Bronchiectasis, uncomplicated: Secondary | ICD-10-CM

## 2020-04-24 DIAGNOSIS — I959 Hypotension, unspecified: Secondary | ICD-10-CM | POA: Diagnosis present

## 2020-04-24 DIAGNOSIS — M8580 Other specified disorders of bone density and structure, unspecified site: Secondary | ICD-10-CM | POA: Diagnosis present

## 2020-04-24 DIAGNOSIS — D509 Iron deficiency anemia, unspecified: Secondary | ICD-10-CM | POA: Diagnosis present

## 2020-04-24 DIAGNOSIS — Z882 Allergy status to sulfonamides status: Secondary | ICD-10-CM | POA: Diagnosis not present

## 2020-04-24 DIAGNOSIS — E039 Hypothyroidism, unspecified: Secondary | ICD-10-CM | POA: Diagnosis present

## 2020-04-24 DIAGNOSIS — E876 Hypokalemia: Secondary | ICD-10-CM | POA: Diagnosis present

## 2020-04-24 DIAGNOSIS — K219 Gastro-esophageal reflux disease without esophagitis: Secondary | ICD-10-CM

## 2020-04-24 DIAGNOSIS — Z853 Personal history of malignant neoplasm of breast: Secondary | ICD-10-CM | POA: Diagnosis not present

## 2020-04-24 DIAGNOSIS — E785 Hyperlipidemia, unspecified: Secondary | ICD-10-CM | POA: Diagnosis present

## 2020-04-24 DIAGNOSIS — Z923 Personal history of irradiation: Secondary | ICD-10-CM | POA: Diagnosis not present

## 2020-04-24 DIAGNOSIS — C50312 Malignant neoplasm of lower-inner quadrant of left female breast: Secondary | ICD-10-CM

## 2020-04-24 DIAGNOSIS — Z170 Estrogen receptor positive status [ER+]: Secondary | ICD-10-CM

## 2020-04-24 DIAGNOSIS — Z888 Allergy status to other drugs, medicaments and biological substances status: Secondary | ICD-10-CM | POA: Diagnosis not present

## 2020-04-24 DIAGNOSIS — E872 Acidosis: Secondary | ICD-10-CM | POA: Diagnosis present

## 2020-04-24 DIAGNOSIS — Z20822 Contact with and (suspected) exposure to covid-19: Secondary | ICD-10-CM | POA: Diagnosis present

## 2020-04-24 DIAGNOSIS — Z885 Allergy status to narcotic agent status: Secondary | ICD-10-CM | POA: Diagnosis not present

## 2020-04-24 DIAGNOSIS — N10 Acute tubulo-interstitial nephritis: Secondary | ICD-10-CM | POA: Diagnosis present

## 2020-04-24 DIAGNOSIS — D638 Anemia in other chronic diseases classified elsewhere: Secondary | ICD-10-CM | POA: Diagnosis present

## 2020-04-24 DIAGNOSIS — N12 Tubulo-interstitial nephritis, not specified as acute or chronic: Secondary | ICD-10-CM | POA: Diagnosis present

## 2020-04-24 DIAGNOSIS — A4151 Sepsis due to Escherichia coli [E. coli]: Secondary | ICD-10-CM | POA: Diagnosis present

## 2020-04-24 DIAGNOSIS — Z87891 Personal history of nicotine dependence: Secondary | ICD-10-CM | POA: Diagnosis not present

## 2020-04-24 DIAGNOSIS — M419 Scoliosis, unspecified: Secondary | ICD-10-CM | POA: Diagnosis present

## 2020-04-24 DIAGNOSIS — G8929 Other chronic pain: Secondary | ICD-10-CM | POA: Diagnosis present

## 2020-04-24 LAB — BLOOD CULTURE ID PANEL (REFLEXED)

## 2020-04-24 LAB — BASIC METABOLIC PANEL
Anion gap: 9 (ref 5–15)
BUN: 9 mg/dL (ref 8–23)
CO2: 17 mmol/L — ABNORMAL LOW (ref 22–32)
Calcium: 6.8 mg/dL — ABNORMAL LOW (ref 8.9–10.3)
Chloride: 114 mmol/L — ABNORMAL HIGH (ref 98–111)
Creatinine, Ser: 0.7 mg/dL (ref 0.44–1.00)
GFR calc Af Amer: >60 mL/min (ref >60)
GFR calc non Af Amer: >60 mL/min (ref >60)
Glucose, Bld: 84 mg/dL (ref 70–99)
Potassium: 3.1 mmol/L — ABNORMAL LOW (ref 3.5–5.1)
Sodium: 140 mmol/L (ref 135–145)

## 2020-04-24 LAB — CBC
HCT: 25.4 % — ABNORMAL LOW (ref 36.0–46.0)
Hemoglobin: 8.4 g/dL — ABNORMAL LOW (ref 12.0–15.0)
MCH: 30.5 pg (ref 26.0–34.0)
MCHC: 33.1 g/dL (ref 30.0–36.0)
MCV: 92.4 fL (ref 80.0–100.0)
Platelets: 151 10*3/uL (ref 150–400)
RBC: 2.75 MIL/uL — ABNORMAL LOW (ref 3.87–5.11)
RDW: 13.5 % (ref 11.5–15.5)
WBC: 7.4 10*3/uL (ref 4.0–10.5)
nRBC: 0 % (ref 0.0–0.2)

## 2020-04-24 MED ORDER — ACETAMINOPHEN 325 MG PO TABS
650.0000 mg | ORAL_TABLET | Freq: Four times a day (QID) | ORAL | Status: DC | PRN
  Administered 2020-04-24 (×2): 650 mg via ORAL
  Filled 2020-04-24 (×2): qty 2

## 2020-04-24 MED ORDER — CALCIUM GLUCONATE-NACL 1-0.675 GM/50ML-% IV SOLN
1.0000 g | Freq: Once | INTRAVENOUS | Status: AC
  Administered 2020-04-24: 1000 mg via INTRAVENOUS
  Filled 2020-04-24: qty 50

## 2020-04-24 MED ORDER — POTASSIUM CHLORIDE CRYS ER 20 MEQ PO TBCR
40.0000 meq | EXTENDED_RELEASE_TABLET | Freq: Once | ORAL | Status: AC
  Administered 2020-04-24: 40 meq via ORAL
  Filled 2020-04-24: qty 2

## 2020-04-24 MED ORDER — TRAMADOL HCL 50 MG PO TABS
50.0000 mg | ORAL_TABLET | Freq: Three times a day (TID) | ORAL | Status: DC | PRN
  Administered 2020-04-24 – 2020-04-25 (×3): 50 mg via ORAL
  Filled 2020-04-24 (×3): qty 1

## 2020-04-24 MED ORDER — SODIUM CHLORIDE 0.9 % IV BOLUS
500.0000 mL | Freq: Once | INTRAVENOUS | Status: AC
  Administered 2020-04-24: 500 mL via INTRAVENOUS

## 2020-04-24 NOTE — ED Notes (Signed)
Patient's pressure noted to be low. MD paged.

## 2020-04-24 NOTE — ED Notes (Signed)
Pt and all belongings transported upstiars by Bri NT

## 2020-04-24 NOTE — Progress Notes (Signed)
PROGRESS NOTE  Cynthia Thornton:[id number] DOB: 1946-05-16 DOA: 04/23/2020 PCP: Colon Branch, MD  HPI/Recap of past 24 hours: HPI from Dr Cynthia Thornton is a 74 y.o. female with medical history significant of breast cancer, bronchiectasis, hypothyroid, osteopenia who has a 1 month history of ongoing dysuria.  Was treated with Macrobid by urgent care with a negative urine culture approximately 4 weeks ago.  Had noticed pus in her urine.  Today developed kidney/back pain with increased urgency and frequency.  Got an appointment with urology was found to have rigors, nausea/vomiting and fever. Pt was sent to the ED. In the ED, pt was noted to be hypotensive, labs somewhat unremarkable, CT stone showed perinephric soft tissue stranding on the right with mild dilatation of the right ureter, UA positive for infection.  Patient was started on IV antibiotics and admitted for further management.    Today, patient denies any new complaints, just feels tired overall, denies any further nausea/vomiting, fever/chills, abdominal pain, chest pain, shortness of breath.    Assessment/Plan: Principal Problem:   Acute pyelonephritis Active Problems:   GERD   Osteopenia   Bronchiectasis (HCC)   Malignant neoplasm of lower-inner quadrant of left breast in female, estrogen receptor positive ([place])   Sepsis likely 2/2 bacteremia with acute pyelonephritis Hypotensive, tachycardic on admission Currently afebrile with no leukocytosis Lactic acid WNL UA positive for infection, UC growing E. coli Blood culture growing E. Coli CT renal stone showed perinephric soft tissue stranding on the right with mild dilatation of the right ureter, indicated above possibly recently passed stone Continue 2 g IV ceftriaxone pending susceptibility Continue IV fluids Monitor closely, telemetry  Hypokalemia/hypocalcemia/acidosis Replace as needed Give potassium supplements, 1 g calcium gluconate Daily BMP  Anemia of  chronic disease Baseline hemoglobin around 11 No signs of bleeding, likely hemodilutional Anemia panel pending Daily CBC  Bronchiectasis Stable Flutter valve as needed  GERD Continue PPI, H2 blocker  History of breast cancer Currently in remission Follows with Dr. [name]       Malnutrition Type:      Malnutrition Characteristics:      Nutrition Interventions:       Estimated body mass index is 26.79 kg/m as calculated from the following:   Height as of this encounter: 5' 3.5" (1.613 m).   Weight as of this encounter: 69.7 kg.     Code Status: Full  Family Communication: Discussed with patient extensively  Disposition Plan: Status is: Inpatient  Remains inpatient appropriate because:Hemodynamically unstable and Inpatient level of care appropriate due to severity of illness   Dispo: The patient is from: Home              Anticipated d/c is to: Home              Anticipated d/c date is: 2 days              Patient currently is not medically stable to d/c.     Consultants:  None  Procedures:  None  Antimicrobials:  Ceftriaxone  DVT prophylaxis: Lovenox   Objective: Vitals:   04/24/20 1029 04/24/20 1131 04/24/20 1250 04/24/20 1341  BP:   (!) 90/55   Pulse: 77 99 87   Resp: (!) 25 19 16    Temp:   97.9 F (36.6 C)   TempSrc:   Oral   SpO2: 98% 95% 100%   Weight:    69.7 kg  Height:    5' 3.5" (1.613 m)  Intake/Output Summary (Last 24 hours) at 04/24/2020 1410 Last data filed at 04/24/2020 0736 Gross per 24 hour  Intake 2422.5 ml  Output --  Net 2422.5 ml   Filed Weights   04/24/20 1341  Weight: 69.7 kg    Exam:  General: NAD   Cardiovascular: S1, S2 present  Respiratory: CTAB  Abdomen: Soft, nontender, nondistended, bowel sounds present  Musculoskeletal: No bilateral pedal edema noted  Skin: Normal  Psychiatry: Normal mood    Data Reviewed: CBC: Recent Labs  Lab 04/23/20 1504 04/23/20 2300  04/24/20 0500  WBC 8.8 9.2 7.4  NEUTROABS 7.4  --   --   HGB 11.0* 10.3* 8.4*  HCT 32.1* 29.8* 25.4*  MCV 89.7 89.0 92.4  PLT 206 196 154   Basic Metabolic Panel: Recent Labs  Lab 04/23/20 1615 04/23/20 2300 04/24/20 0500  NA 137  --  140  K 3.7  --  3.1*  CL 103  --  114*  CO2 23  --  17*  GLUCOSE 106*  --  84  BUN 18  --  9  CREATININE 0.94 1.02* 0.70  CALCIUM 8.4*  --  6.8*   GFR: Estimated Creatinine Clearance: 60.2 mL/min (by C-G formula based on SCr of 0.7 mg/dL). Liver Function Tests: No results for input(s): AST, ALT, ALKPHOS, BILITOT, PROT, ALBUMIN in the last 168 hours. No results for input(s): LIPASE, AMYLASE in the last 168 hours. No results for input(s): AMMONIA in the last 168 hours. Coagulation Profile: No results for input(s): INR, PROTIME in the last 168 hours. Cardiac Enzymes: No results for input(s): CKTOTAL, CKMB, CKMBINDEX, TROPONINI in the last 168 hours. BNP (last 3 results) No results for input(s): PROBNP in the last 8760 hours. HbA1C: No results for input(s): HGBA1C in the last 72 hours. CBG: Recent Labs  Lab 04/23/20 1555  GLUCAP 106*   Lipid Profile: No results for input(s): CHOL, HDL, LDLCALC, TRIG, CHOLHDL, LDLDIRECT in the last 72 hours. Thyroid Function Tests: Recent Labs    04/23/20 0916  TSH 1.86  FREET4 0.89   Anemia Panel: No results for input(s): VITAMINB12, FOLATE, FERRITIN, TIBC, IRON, RETICCTPCT in the last 72 hours. Urine analysis:    Component Value Date/Time   COLORURINE YELLOW 04/23/2020 1440   APPEARANCEUR CLOUDY (A) 04/23/2020 1440   LABSPEC 1.010 04/23/2020 1440   PHURINE 6.0 04/23/2020 1440   GLUCOSEU NEGATIVE 04/23/2020 1440   HGBUR MODERATE (A) 04/23/2020 1440   HGBUR moderate 10/14/2008 1615   BILIRUBINUR NEGATIVE 04/23/2020 1440   BILIRUBINUR Negative 09/04/2018 1643   KETONESUR NEGATIVE 04/23/2020 1440   PROTEINUR 100 (A) 04/23/2020 1440   UROBILINOGEN negative (A) 09/04/2018 1643   UROBILINOGEN  0.2 11/17/2013 1624   NITRITE NEGATIVE 04/23/2020 1440   LEUKOCYTESUR LARGE (A) 04/23/2020 1440   Sepsis Labs: @LABRCNTIP (procalcitonin:4,lacticidven:4)  ) Recent Results (from the past 240 hour(s))  Urine culture     Status: Abnormal (Preliminary result)   Collection Time: 04/23/20  2:40 PM   Specimen: Urine, Clean Catch  Result Value Ref Range Status   Specimen Description   Final    URINE, CLEAN CATCH Performed at [facility], [place] [address] [place], [place] [address]    Special Requests   Final    NONE Performed at [facility], [place] [address] [place], [place] [address]    Culture >=100,000 COLONIES/mL ESCHERICHIA COLI (A)  Final   Report Status PENDING  Incomplete  Culture, blood (routine x 2)  Status: None (Preliminary result)   Collection Time: 04/23/20  3:04 PM   Specimen: BLOOD  Result Value Ref Range Status   Specimen Description   Final    BLOOD LEFT ANTECUBITAL Performed at [place] [address], [place], [place] [address]    Special Requests   Final    BOTTLES DRAWN AEROBIC AND ANAEROBIC Blood Culture results may not be optimal due to an inadequate volume of blood received in culture bottles Performed at [facility] [place] [address] [place], [place] [address]    Culture  Setup Time   Final    GRAM NEGATIVE RODS IN BOTH AEROBIC AND ANAEROBIC BOTTLES Organism ID to follow CRITICAL RESULT CALLED TO, READ BACK BY AND VERIFIED WITH: PHRMD J LEGGE @0650  04/24/20 BY S GEZAHEGN Performed at [facility], [place] [address], [place], [place] [address]    Culture PENDING  Incomplete   Report Status PENDING  Incomplete  Culture, blood (routine x 2)     Status: None (Preliminary result)   Collection Time: 04/23/20  3:04 PM   Specimen: BLOOD  Result Value Ref Range Status   Specimen Description   Final    BLOOD RIGHT ANTECUBITAL Performed at [place] [address] [place], [place] [address]    Special Requests   Final    BOTTLES DRAWN AEROBIC AND ANAEROBIC Blood Culture results may not be optimal due to an inadequate volume of blood received in culture bottles Performed at [place] [address] [place], [place] [address]    Culture   Final    NO GROWTH < 24 HOURS Performed at [facility] [address] [place], [place] [address]    Report Status PENDING  Incomplete  Blood Culture ID Panel (Reflexed)     Status: Abnormal   Collection Time: 04/23/20  3:04 PM  Result Value Ref Range Status   Enterococcus species NOT DETECTED NOT DETECTED Final   Listeria monocytogenes NOT DETECTED NOT DETECTED Final   Staphylococcus species NOT DETECTED NOT DETECTED Final   Staphylococcus aureus (BCID) NOT DETECTED NOT DETECTED Final   Streptococcus species NOT DETECTED NOT DETECTED Final   Streptococcus agalactiae NOT DETECTED NOT DETECTED Final   Streptococcus pneumoniae NOT DETECTED NOT DETECTED Final   Streptococcus pyogenes NOT DETECTED NOT DETECTED Final   Acinetobacter baumannii NOT DETECTED NOT DETECTED Final   Enterobacteriaceae species DETECTED (A) NOT DETECTED Final    Comment: Enterobacteriaceae represent a large family of gram-negative bacteria, not a single organism. CRITICAL RESULT CALLED TO, READ BACK BY AND VERIFIED WITH: PHRMD J LEGGE @0650  04/24/20 BY S GEZAHEGN    Enterobacter cloacae complex NOT DETECTED NOT DETECTED Final   Escherichia coli DETECTED (A) NOT DETECTED Final    Comment: CRITICAL RESULT CALLED TO, READ BACK BY AND VERIFIED WITH: PHRMD J LEGGE @0650  04/24/20 BY S GEZAHEGN    Klebsiella oxytoca NOT DETECTED NOT DETECTED Final   Klebsiella pneumoniae NOT DETECTED NOT DETECTED Final   Proteus species NOT DETECTED NOT DETECTED Final   Serratia marcescens NOT DETECTED NOT DETECTED Final   Carbapenem resistance NOT DETECTED NOT DETECTED Final   Haemophilus influenzae NOT DETECTED NOT  DETECTED Final   Neisseria meningitidis NOT DETECTED NOT DETECTED Final   Pseudomonas aeruginosa NOT DETECTED NOT DETECTED Final   Candida albicans NOT DETECTED NOT DETECTED Final   Candida glabrata NOT DETECTED NOT DETECTED Final   Candida krusei NOT DETECTED NOT  DETECTED Final   Candida parapsilosis NOT DETECTED NOT DETECTED Final   Candida tropicalis NOT DETECTED NOT DETECTED Final    Comment: Performed at [facility], [place] [address], [place], [place] [address]  SARS Coronavirus 2 by RT PCR (hospital order, performed in [facility] hospital lab) Nasopharyngeal Nasopharyngeal Swab     Status: None   Collection Time: 04/23/20  4:24 PM   Specimen: Nasopharyngeal Swab  Result Value Ref Range Status   SARS Coronavirus 2 NEGATIVE NEGATIVE Final    Comment: (NOTE) SARS-CoV-2 target nucleic acids are NOT DETECTED.  The SARS-CoV-2 RNA is generally detectable in upper and lower respiratory specimens during the acute phase of infection. The lowest concentration of SARS-CoV-2 viral copies this assay can detect is 250 copies / mL. A negative result does not preclude SARS-CoV-2 infection and should not be used as the sole basis for treatment or other patient management decisions.  A negative result may occur with improper specimen collection / handling, submission of specimen other than nasopharyngeal swab, presence of viral mutation(s) within the areas targeted by this assay, and inadequate number of viral copies (<250 copies / mL). A negative result must be combined with clinical observations, patient history, and epidemiological information.  Fact Sheet for Patients:   StrictlyIdeas.no  Fact Sheet for Healthcare Providers: BankingDealers.co.za  This test is not yet approved or  cleared by the [place] FDA and has been authorized for detection and/or diagnosis of SARS-CoV-2 by FDA under an Emergency Use Authorization (EUA).  This  EUA will remain in effect (meaning this test can be used) for the duration of the COVID-19 declaration under Section 564(b)(1) of the Act, 21 U.S.C. section 360bbb-3(b)(1), unless the authorization is terminated or revoked sooner.  Performed at [facility], [place] [address] [place], [place] [address]       Studies: CT Renal Stone Study  Result Date: 04/23/2020 CLINICAL DATA:  Worsening right flank pain over the last week with vomiting and shaking. History of urinary tract infections. Concern for calculus. EXAM: CT ABDOMEN AND PELVIS WITHOUT CONTRAST TECHNIQUE: Multidetector CT imaging of the abdomen and pelvis was performed following the standard protocol without IV contrast. COMPARISON:  Contrast-enhanced abdominopelvic CT 09/15/2014. FINDINGS: Lower chest: Stable bronchiectasis in the right middle lobe and lingula. The lung bases are otherwise clear. No significant pleural or pericardial effusion. There is a small hiatal hernia and mild aortic atherosclerosis. Hepatobiliary: There is a stable lobulated cyst in the left hepatic lobe measuring up to 3.7 cm on image 24/2. Other scattered smaller low-density lesions are grossly stable. No worrisome hepatic findings on noncontrast imaging. No evidence of gallstones, gallbladder wall thickening or biliary dilatation. Pancreas: Unremarkable. No pancreatic ductal dilatation or surrounding inflammatory changes. Spleen: Normal in size. Previously demonstrated low-density lesions are not well seen on this noncontrast study. Adrenals/Urinary Tract: Both adrenal glands appear normal. No evidence of renal, ureteral or bladder calculus. There is mild asymmetric perinephric soft tissue stranding on the right without focal fluid collection. The right ureter is mildly dilated. The left kidney appears normal. The bladder is mildly distended without focal abnormality or surrounding inflammation. Stomach/Bowel: No evidence of bowel wall thickening,  distention or surrounding inflammatory change. Small retrocecal appendix appears stable. There is moderate stool throughout the colon. Vascular/Lymphatic: There are no enlarged abdominal or pelvic lymph nodes. No significant vascular findings on noncontrast imaging. Reproductive: Hysterectomy.  No adnexal mass. Other: No evidence of abdominal wall mass or hernia. No ascites. Musculoskeletal:  No acute or significant osseous findings. Mild thoracolumbar scoliosis and spondylosis. Small sacral Tarlov cyst. IMPRESSION: 1. Mild asymmetric perinephric soft tissue stranding on the right with mild dilatation of the right ureter. No evidence of renal, ureteral or bladder calculus. Findings could be secondary to a recently passed calculus or urinary tract infection. Correlation with urine analysis recommended. 2. Stable hepatic cysts. Electronically Signed   By: Richardean Sale M.D.   On: 04/23/2020 17:54    Scheduled Meds: . aspirin EC  81 mg Oral Daily  . enoxaparin (LOVENOX) injection  40 mg Subcutaneous QHS  . famotidine  20 mg Oral QHS  . loratadine  10 mg Oral Daily  . pantoprazole  40 mg Oral Daily    Continuous Infusions: . sodium chloride 100 mL/hr at 04/24/20 0736  . cefTRIAXone (ROCEPHIN)  IV Stopped (04/24/20 0016)     LOS: 0 days     Alma Friendly, MD [facility]  If 7PM-7AM, please contact night-coverage [email or web address] 04/24/2020, 2:10 PM

## 2020-04-24 NOTE — Progress Notes (Signed)
PHARMACY - PHYSICIAN COMMUNICATION CRITICAL VALUE ALERT - BLOOD CULTURE IDENTIFICATION (BCID)  Cynthia Thornton is an 74 y.o. female who presented to [facility] on 04/23/2020 with a chief complaint of UTI  Assessment:  2 of 4 bottles BCx bottles growing [place]  Name of physician (or Provider) Contacted: n/a  Current antibiotics: Rocephin 2g IV daily  Changes to prescribed antibiotics recommended:  Continue current abx  Results for orders placed or performed during the hospital encounter of 04/23/20  Blood Culture ID Panel (Reflexed) (Collected: 04/23/2020  3:04 PM)  Result Value Ref Range   Enterococcus species NOT DETECTED NOT DETECTED   Listeria monocytogenes NOT DETECTED NOT DETECTED   Staphylococcus species NOT DETECTED NOT DETECTED   Staphylococcus aureus (BCID) NOT DETECTED NOT DETECTED   Streptococcus species NOT DETECTED NOT DETECTED   Streptococcus agalactiae NOT DETECTED NOT DETECTED   Streptococcus pneumoniae NOT DETECTED NOT DETECTED   Streptococcus pyogenes NOT DETECTED NOT DETECTED   Acinetobacter baumannii NOT DETECTED NOT DETECTED   Enterobacteriaceae species DETECTED (A) NOT DETECTED   Enterobacter cloacae complex NOT DETECTED NOT DETECTED   Escherichia coli DETECTED (A) NOT DETECTED   Klebsiella oxytoca NOT DETECTED NOT DETECTED   Klebsiella pneumoniae NOT DETECTED NOT DETECTED   Proteus species NOT DETECTED NOT DETECTED   Serratia marcescens NOT DETECTED NOT DETECTED   Carbapenem resistance NOT DETECTED NOT DETECTED   Haemophilus influenzae NOT DETECTED NOT DETECTED   Neisseria meningitidis NOT DETECTED NOT DETECTED   Pseudomonas aeruginosa NOT DETECTED NOT DETECTED   Candida albicans NOT DETECTED NOT DETECTED   Candida glabrata NOT DETECTED NOT DETECTED   Candida krusei NOT DETECTED NOT DETECTED   Candida parapsilosis NOT DETECTED NOT DETECTED   Candida tropicalis NOT DETECTED NOT DETECTED    Kara Mead 04/24/2020  7:21 AM

## 2020-04-24 NOTE — ED Notes (Signed)
[id number] pt has been resting, given meds for headache, she ate breakfast, has been moved to a hospital bed for comfort, daughter at bedside, continue to monitor. Pt aware of delay in going to a room.

## 2020-04-25 LAB — CBC WITH DIFFERENTIAL/PLATELET
Abs Immature Granulocytes: 0.03 10*3/uL (ref 0.00–0.07)
Basophils Absolute: 0 10*3/uL (ref 0.0–0.1)
Basophils Relative: 1 %
Eosinophils Absolute: 0.5 10*3/uL (ref 0.0–0.5)
Eosinophils Relative: 6 %
HCT: 29.5 % — ABNORMAL LOW (ref 36.0–46.0)
Hemoglobin: 9.9 g/dL — ABNORMAL LOW (ref 12.0–15.0)
Immature Granulocytes: 0 %
Lymphocytes Relative: 14 %
Lymphs Abs: 1 10*3/uL (ref 0.7–4.0)
MCH: 30.3 pg (ref 26.0–34.0)
MCHC: 33.6 g/dL (ref 30.0–36.0)
MCV: 90.2 fL (ref 80.0–100.0)
Monocytes Absolute: 0.8 10*3/uL (ref 0.1–1.0)
Monocytes Relative: 12 %
Neutro Abs: 5 10*3/uL (ref 1.7–7.7)
Neutrophils Relative %: 67 %
Platelets: 196 10*3/uL (ref 150–400)
RBC: 3.27 MIL/uL — ABNORMAL LOW (ref 3.87–5.11)
RDW: 13.5 % (ref 11.5–15.5)
WBC: 7.3 10*3/uL (ref 4.0–10.5)
nRBC: 0 % (ref 0.0–0.2)

## 2020-04-25 LAB — FERRITIN: Ferritin: 56 ng/mL (ref 11–307)

## 2020-04-25 LAB — BASIC METABOLIC PANEL
Anion gap: 6 (ref 5–15)
BUN: 10 mg/dL (ref 8–23)
CO2: 22 mmol/L (ref 22–32)
Calcium: 8 mg/dL — ABNORMAL LOW (ref 8.9–10.3)
Chloride: 107 mmol/L (ref 98–111)
Creatinine, Ser: 0.96 mg/dL (ref 0.44–1.00)
GFR calc Af Amer: >60 mL/min (ref >60)
GFR calc non Af Amer: 59 mL/min — ABNORMAL LOW (ref >60)
Glucose, Bld: 91 mg/dL (ref 70–99)
Potassium: 4 mmol/L (ref 3.5–5.1)
Sodium: 135 mmol/L (ref 135–145)

## 2020-04-25 LAB — VITAMIN B12: Vitamin B-12: 205 pg/mL (ref 180–914)

## 2020-04-25 LAB — IRON AND TIBC
Iron: 14 ug/dL — ABNORMAL LOW (ref 28–170)
Saturation Ratios: 5 % — ABNORMAL LOW (ref 10.4–31.8)
TIBC: 270 ug/dL (ref 250–450)
UIBC: 256 ug/dL

## 2020-04-25 LAB — URINE CULTURE: Culture: >=100000 — AB

## 2020-04-25 LAB — FOLATE: Folate: 23 ng/mL (ref >5.9)

## 2020-04-25 MED ORDER — TRAMADOL HCL 50 MG PO TABS
50.0000 mg | ORAL_TABLET | Freq: Four times a day (QID) | ORAL | Status: DC | PRN
  Administered 2020-04-25 – 2020-04-26 (×2): 50 mg via ORAL
  Filled 2020-04-25 (×2): qty 1

## 2020-04-25 MED ORDER — CYANOCOBALAMIN 1000 MCG/ML IJ SOLN
1000.0000 ug | Freq: Once | INTRAMUSCULAR | Status: AC
  Administered 2020-04-25: 1000 ug via SUBCUTANEOUS
  Filled 2020-04-25: qty 1

## 2020-04-25 MED ORDER — BISMUTH SUBSALICYLATE 262 MG/15ML PO SUSP
30.0000 mL | ORAL | Status: DC | PRN
  Administered 2020-04-25 – 2020-04-27 (×2): 30 mL via ORAL
  Filled 2020-04-25: qty 236

## 2020-04-25 MED ORDER — FERROUS SULFATE 325 (65 FE) MG PO TABS
325.0000 mg | ORAL_TABLET | Freq: Every day | ORAL | Status: DC
  Administered 2020-04-25 – 2020-04-28 (×4): 325 mg via ORAL
  Filled 2020-04-25 (×4): qty 1

## 2020-04-25 MED ORDER — VITAMIN B-12 1000 MCG PO TABS
1000.0000 ug | ORAL_TABLET | Freq: Every day | ORAL | Status: DC
  Administered 2020-04-26 – 2020-04-28 (×3): 1000 ug via ORAL
  Filled 2020-04-25 (×4): qty 1

## 2020-04-25 MED ORDER — OMEPRAZOLE 20 MG PO CPDR
40.0000 mg | DELAYED_RELEASE_CAPSULE | Freq: Every day | ORAL | Status: DC
  Administered 2020-04-26 – 2020-04-28 (×3): 40 mg via ORAL
  Filled 2020-04-25 (×3): qty 2

## 2020-04-25 NOTE — Progress Notes (Signed)
PROGRESS NOTE  Cynthia Thornton JJK:[id number] DOB: 1946/09/13 DOA: 04/23/2020 PCP: Colon Branch, MD  HPI/Recap of past 24 hours: HPI from Dr [name] is a 74 y.o. female with medical history significant of breast cancer, bronchiectasis, hypothyroid, osteopenia who has a 1 month history of ongoing dysuria.  Was treated with Macrobid by urgent care with a negative urine culture approximately 4 weeks ago.  Had noticed pus in her urine.  Today developed kidney/back pain with increased urgency and frequency.  Got an appointment with urology was found to have rigors, nausea/vomiting and fever. Pt was sent to the ED. In the ED, pt was noted to be hypotensive, labs somewhat unremarkable, CT stone showed perinephric soft tissue stranding on the right with mild dilatation of the right ureter, UA positive for infection.  Patient was started on IV antibiotics and admitted for further management.    Today, patient still feeling overall poor, still a poor appetite, denies any vomiting, abdominal pain, fever/chills, chest pain, shortness of breath.    Assessment/Plan: Principal Problem:   Acute pyelonephritis Active Problems:   GERD   Osteopenia   Bronchiectasis (HCC)   Malignant neoplasm of lower-inner quadrant of left breast in female, estrogen receptor positive ([place])   Sepsis likely 2/2 E.coli bacteremia with acute pyelonephritis Hypotensive, tachycardic on admission Currently afebrile with no leukocytosis Lactic acid WNL UA positive for infection, UC growing E. coli Blood culture growing E. Coli CT renal stone showed perinephric soft tissue stranding on the right with mild dilatation of the right ureter, indicated above possibly recently passed stone Continue 2 g IV ceftriaxone pending susceptibility Continue IV fluids Monitor closely, telemetry  Hypokalemia/hypocalcemia/acidosis Replace as needed Give potassium supplements, 1 g calcium gluconate Daily BMP  Iron def  Anemia/chronic disease Baseline hemoglobin around 11 No signs of bleeding, worsened hemodilutional Anemia panel with iron 14, sats 5%, Vit B12 205 (diet mainly vegan) Start B12 and iron supplementation Daily CBC  Bronchiectasis Stable Flutter valve as needed  GERD Continue PPI, H2 blocker  History of breast cancer Currently in remission Follows with Dr. [name]       Malnutrition Type:      Malnutrition Characteristics:      Nutrition Interventions:       Estimated body mass index is 26.79 kg/m as calculated from the following:   Height as of this encounter: 5' 3.5" (1.613 m).   Weight as of this encounter: 69.7 kg.     Code Status: Full  Family Communication: Discussed with patient extensively  Disposition Plan: Status is: Inpatient  Remains inpatient appropriate because:Hemodynamically unstable and Inpatient level of care appropriate due to severity of illness   Dispo: The patient is from: Home              Anticipated d/c is to: Home              Anticipated d/c date is: 2 days              Patient currently is not medically stable to d/c.     Consultants:  None  Procedures:  None  Antimicrobials:  Ceftriaxone  DVT prophylaxis: Lovenox   Objective: Vitals:   04/24/20 1727 04/24/20 2029 04/25/20 0047 04/25/20 0444  BP:  99/61 (!) 115/57 114/66  Pulse: 92 88 85 88  Resp:  18 16 16   Temp:  99.1 F (37.3 C) 98.5 F (36.9 C) 98.7 F (37.1 C)  TempSrc:      SpO2:  97% 94% 99% 100%  Weight:      Height:        Intake/Output Summary (Last 24 hours) at 04/25/2020 1419 Last data filed at 04/25/2020 1022 Gross per 24 hour  Intake 517.5 ml  Output --  Net 517.5 ml   Filed Weights   04/24/20 1341  Weight: 69.7 kg    Exam:  General: NAD   Cardiovascular: S1, S2 present  Respiratory: CTAB  Abdomen: Soft, nontender, nondistended, bowel sounds present  Musculoskeletal: No bilateral pedal edema noted  Skin:  Normal  Psychiatry: Normal mood    Data Reviewed: CBC: Recent Labs  Lab 04/23/20 1504 04/23/20 2300 04/24/20 0500 04/25/20 0434  WBC 8.8 9.2 7.4 7.3  NEUTROABS 7.4  --   --  5.0  HGB 11.0* 10.3* 8.4* 9.9*  HCT 32.1* 29.8* 25.4* 29.5*  MCV 89.7 89.0 92.4 90.2  PLT 206 196 151 956   Basic Metabolic Panel: Recent Labs  Lab 04/23/20 1615 04/23/20 2300 04/24/20 0500 04/25/20 0434  NA 137  --  140 135  K 3.7  --  3.1* 4.0  CL 103  --  114* 107  CO2 23  --  17* 22  GLUCOSE 106*  --  84 91  BUN 18  --  9 10  CREATININE 0.94 1.02* 0.70 0.96  CALCIUM 8.4*  --  6.8* 8.0*   GFR: Estimated Creatinine Clearance: 50.2 mL/min (by C-G formula based on SCr of 0.96 mg/dL). Liver Function Tests: No results for input(s): AST, ALT, ALKPHOS, BILITOT, PROT, ALBUMIN in the last 168 hours. No results for input(s): LIPASE, AMYLASE in the last 168 hours. No results for input(s): AMMONIA in the last 168 hours. Coagulation Profile: No results for input(s): INR, PROTIME in the last 168 hours. Cardiac Enzymes: No results for input(s): CKTOTAL, CKMB, CKMBINDEX, TROPONINI in the last 168 hours. BNP (last 3 results) No results for input(s): PROBNP in the last 8760 hours. HbA1C: No results for input(s): HGBA1C in the last 72 hours. CBG: Recent Labs  Lab 04/23/20 1555  GLUCAP 106*   Lipid Profile: No results for input(s): CHOL, HDL, LDLCALC, TRIG, CHOLHDL, LDLDIRECT in the last 72 hours. Thyroid Function Tests: Recent Labs    04/23/20 0916  TSH 1.86  FREET4 0.89   Anemia Panel: Recent Labs    04/25/20 0434  VITAMINB12 205  FOLATE 23.0  FERRITIN 56  TIBC 270  IRON 14*   Urine analysis:    Component Value Date/Time   COLORURINE YELLOW 04/23/2020 1440   APPEARANCEUR CLOUDY (A) 04/23/2020 1440   LABSPEC 1.010 04/23/2020 1440   PHURINE 6.0 04/23/2020 1440   GLUCOSEU NEGATIVE 04/23/2020 1440   HGBUR MODERATE (A) 04/23/2020 1440   HGBUR moderate 10/14/2008 1615   BILIRUBINUR  NEGATIVE 04/23/2020 1440   BILIRUBINUR Negative 09/04/2018 1643   KETONESUR NEGATIVE 04/23/2020 1440   PROTEINUR 100 (A) 04/23/2020 1440   UROBILINOGEN negative (A) 09/04/2018 1643   UROBILINOGEN 0.2 11/17/2013 1624   NITRITE NEGATIVE 04/23/2020 1440   LEUKOCYTESUR LARGE (A) 04/23/2020 1440   Sepsis Labs: @LABRCNTIP (procalcitonin:4,lacticidven:4)  ) Recent Results (from the past 240 hour(s))  Urine culture     Status: Abnormal   Collection Time: 04/23/20  2:40 PM   Specimen: Urine, Clean Catch  Result Value Ref Range Status   Specimen Description   Final    URINE, CLEAN CATCH Performed at [facility], [place] [address], [place],  [address]    Special Requests   Final  NONE Performed at [facility], [place] [address], [place], [place] [address]    Culture >=100,000 COLONIES/mL ESCHERICHIA COLI (A)  Final   Report Status 04/25/2020 FINAL  Final   Organism ID, Bacteria ESCHERICHIA COLI (A)  Final      Susceptibility   Escherichia coli - MIC*    AMPICILLIN >=32 RESISTANT Resistant     CEFAZOLIN <=4 SENSITIVE Sensitive     CEFTRIAXONE <=0.25 SENSITIVE Sensitive     CIPROFLOXACIN <=0.25 SENSITIVE Sensitive     GENTAMICIN <=1 SENSITIVE Sensitive     IMIPENEM <=0.25 SENSITIVE Sensitive     NITROFURANTOIN <=16 SENSITIVE Sensitive     TRIMETH/SULFA >=320 RESISTANT Resistant     AMPICILLIN/SULBACTAM >=32 RESISTANT Resistant     PIP/TAZO <=4 SENSITIVE Sensitive     * >=100,000 COLONIES/mL ESCHERICHIA COLI  Culture, blood (routine x 2)     Status: Abnormal (Preliminary result)   Collection Time: 04/23/20  3:04 PM   Specimen: BLOOD  Result Value Ref Range Status   Specimen Description   Final    BLOOD LEFT ANTECUBITAL Performed at [facility] [address] [place], [place] [address]    Special Requests   Final    BOTTLES DRAWN AEROBIC AND ANAEROBIC Blood Culture results may not be optimal due to an inadequate  volume of blood received in culture bottles Performed at [facility] [address] [place], [place] [address]    Culture  Setup Time   Final    GRAM NEGATIVE RODS IN BOTH AEROBIC AND ANAEROBIC BOTTLES CRITICAL RESULT CALLED TO, READ BACK BY AND VERIFIED WITH: PHRMD J LEGGE @0650  04/24/20 BY S GEZAHEGN    Culture (A)  Final    ESCHERICHIA COLI SUSCEPTIBILITIES TO FOLLOW Performed at [facility], [place] [address], [place], [place] [address]    Report Status PENDING  Incomplete  Culture, blood (routine x 2)     Status: None (Preliminary result)   Collection Time: 04/23/20  3:04 PM   Specimen: BLOOD  Result Value Ref Range Status   Specimen Description   Final    BLOOD RIGHT ANTECUBITAL Performed at [facility] [address] [place], [place] [address]    Special Requests   Final    BOTTLES DRAWN AEROBIC AND ANAEROBIC Blood Culture results may not be optimal due to an inadequate volume of blood received in culture bottles Performed at [facility] [address], [place], [place] [address]    Culture   Final    NO GROWTH 2 DAYS Performed at [facility] [address] [place],  [address]    Report Status PENDING  Incomplete  Blood Culture ID Panel (Reflexed)     Status: Abnormal   Collection Time: 04/23/20  3:04 PM  Result Value Ref Range Status   Enterococcus species NOT DETECTED NOT DETECTED Final   Listeria monocytogenes NOT DETECTED NOT DETECTED Final   Staphylococcus species NOT DETECTED NOT DETECTED Final   Staphylococcus aureus (BCID) NOT DETECTED NOT DETECTED Final   Streptococcus species NOT DETECTED NOT DETECTED Final   Streptococcus agalactiae NOT DETECTED NOT DETECTED Final   Streptococcus pneumoniae NOT DETECTED NOT DETECTED Final   Streptococcus pyogenes NOT DETECTED NOT DETECTED Final   Acinetobacter baumannii NOT DETECTED NOT DETECTED Final   Enterobacteriaceae species DETECTED  (A) NOT DETECTED Final    Comment: Enterobacteriaceae represent a large family of gram-negative bacteria, not a single organism. CRITICAL  RESULT CALLED TO, READ BACK BY AND VERIFIED WITH: PHRMD J LEGGE @0650  04/24/20 BY S GEZAHEGN    Enterobacter cloacae complex NOT DETECTED NOT DETECTED Final   Escherichia coli DETECTED (A) NOT DETECTED Final    Comment: CRITICAL RESULT CALLED TO, READ BACK BY AND VERIFIED WITH: PHRMD J LEGGE @0650  04/24/20 BY S GEZAHEGN    Klebsiella oxytoca NOT DETECTED NOT DETECTED Final   Klebsiella pneumoniae NOT DETECTED NOT DETECTED Final   Proteus species NOT DETECTED NOT DETECTED Final   Serratia marcescens NOT DETECTED NOT DETECTED Final   Carbapenem resistance NOT DETECTED NOT DETECTED Final   Haemophilus influenzae NOT DETECTED NOT DETECTED Final   Neisseria meningitidis NOT DETECTED NOT DETECTED Final   Pseudomonas aeruginosa NOT DETECTED NOT DETECTED Final   Candida albicans NOT DETECTED NOT DETECTED Final   Candida glabrata NOT DETECTED NOT DETECTED Final   Candida krusei NOT DETECTED NOT DETECTED Final   Candida parapsilosis NOT DETECTED NOT DETECTED Final   Candida tropicalis NOT DETECTED NOT DETECTED Final    Comment: Performed at [facility], [place] [address], [place], [place] [address]  SARS Coronavirus 2 by RT PCR (hospital order, performed in [facility] hospital lab) Nasopharyngeal Nasopharyngeal Swab     Status: None   Collection Time: 04/23/20  4:24 PM   Specimen: Nasopharyngeal Swab  Result Value Ref Range Status   SARS Coronavirus 2 NEGATIVE NEGATIVE Final    Comment: (NOTE) SARS-CoV-2 target nucleic acids are NOT DETECTED.  The SARS-CoV-2 RNA is generally detectable in upper and lower respiratory specimens during the acute phase of infection. The lowest concentration of SARS-CoV-2 viral copies this assay can detect is 250 copies / mL. A negative result does not preclude SARS-CoV-2 infection and should not be used as the sole basis  for treatment or other patient management decisions.  A negative result may occur with improper specimen collection / handling, submission of specimen other than nasopharyngeal swab, presence of viral mutation(s) within the areas targeted by this assay, and inadequate number of viral copies (<250 copies / mL). A negative result must be combined with clinical observations, patient history, and epidemiological information.  Fact Sheet for Patients:   StrictlyIdeas.no  Fact Sheet for Healthcare Providers: BankingDealers.co.za  This test is not yet approved or  cleared by the [place] FDA and has been authorized for detection and/or diagnosis of SARS-CoV-2 by FDA under an Emergency Use Authorization (EUA).  This EUA will remain in effect (meaning this test can be used) for the duration of the COVID-19 declaration under Section 564(b)(1) of the Act, 21 U.S.C. section 360bbb-3(b)(1), unless the authorization is terminated or revoked sooner.  Performed at [facility], [place] [address], [place], [place] [address]       Studies: No results found.  Scheduled Meds: . aspirin EC  81 mg Oral Daily  . enoxaparin (LOVENOX) injection  40 mg Subcutaneous QHS  . famotidine  20 mg Oral QHS  . ferrous sulfate  325 mg Oral Q breakfast  . loratadine  10 mg Oral Daily  . pantoprazole  40 mg Oral Daily  . [START ON 04/26/2020] vitamin B-12  1,000 mcg Oral Daily    Continuous Infusions: . sodium chloride 100 mL/hr at 04/25/20 1005  . cefTRIAXone (ROCEPHIN)  IV 2 g (04/24/20 2356)     LOS: 1 day     Alma Friendly, MD [facility]  If 7PM-7AM, please contact night-coverage [email or web address] 04/25/2020, 2:19 PM

## 2020-04-26 LAB — CULTURE, BLOOD (ROUTINE X 2)

## 2020-04-26 LAB — CBC WITH DIFFERENTIAL/PLATELET
Abs Immature Granulocytes: 0.03 10*3/uL (ref 0.00–0.07)
Basophils Absolute: 0 10*3/uL (ref 0.0–0.1)
Basophils Relative: 1 %
Eosinophils Absolute: 0.4 10*3/uL (ref 0.0–0.5)
Eosinophils Relative: 9 %
HCT: 27.4 % — ABNORMAL LOW (ref 36.0–46.0)
Hemoglobin: 9.5 g/dL — ABNORMAL LOW (ref 12.0–15.0)
Immature Granulocytes: 1 %
Lymphocytes Relative: 20 %
Lymphs Abs: 0.9 10*3/uL (ref 0.7–4.0)
MCH: 30.7 pg (ref 26.0–34.0)
MCHC: 34.7 g/dL (ref 30.0–36.0)
MCV: 88.7 fL (ref 80.0–100.0)
Monocytes Absolute: 0.6 10*3/uL (ref 0.1–1.0)
Monocytes Relative: 14 %
Neutro Abs: 2.5 10*3/uL (ref 1.7–7.7)
Neutrophils Relative %: 55 %
Platelets: 192 10*3/uL (ref 150–400)
RBC: 3.09 MIL/uL — ABNORMAL LOW (ref 3.87–5.11)
RDW: 13.3 % (ref 11.5–15.5)
WBC: 4.4 10*3/uL (ref 4.0–10.5)
nRBC: 0 % (ref 0.0–0.2)

## 2020-04-26 LAB — BASIC METABOLIC PANEL
Anion gap: 10 (ref 5–15)
BUN: 9 mg/dL (ref 8–23)
CO2: 18 mmol/L — ABNORMAL LOW (ref 22–32)
Calcium: 8.1 mg/dL — ABNORMAL LOW (ref 8.9–10.3)
Chloride: 106 mmol/L (ref 98–111)
Creatinine, Ser: 0.8 mg/dL (ref 0.44–1.00)
GFR calc Af Amer: >60 mL/min (ref >60)
GFR calc non Af Amer: >60 mL/min (ref >60)
Glucose, Bld: 93 mg/dL (ref 70–99)
Potassium: 3.8 mmol/L (ref 3.5–5.1)
Sodium: 134 mmol/L — ABNORMAL LOW (ref 135–145)

## 2020-04-26 MED ORDER — CEFAZOLIN SODIUM-DEXTROSE 2-4 GM/100ML-% IV SOLN
2.0000 g | Freq: Three times a day (TID) | INTRAVENOUS | Status: DC
  Administered 2020-04-26 – 2020-04-28 (×5): 2 g via INTRAVENOUS
  Filled 2020-04-26 (×6): qty 100

## 2020-04-26 MED ORDER — PROMETHAZINE HCL 25 MG/ML IJ SOLN
12.5000 mg | Freq: Three times a day (TID) | INTRAMUSCULAR | Status: DC | PRN
  Administered 2020-04-26: 12.5 mg via INTRAVENOUS
  Filled 2020-04-26: qty 1

## 2020-04-26 NOTE — Progress Notes (Signed)
OT Cancellation Note  Patient Details Name: TAISA DELORIA MRN: [id number] DOB: 01-22-1946   Cancelled Treatment:    Reason Eval/Treat Not Completed: Medical issues which prohibited therapy  Will check on pt next day  Kari Baars, [place] [phone] Office- [phone], [place]asa Parkin 04/26/2020, 4:58 PM

## 2020-04-26 NOTE — Progress Notes (Signed)
PT Cancellation Note  Patient Details Name: Cynthia Thornton MRN: [id number] DOB: November 20, 1945   Cancelled Treatment:    Reason Eval/Treat Not Completed: Medical issues which prohibited therapy, per Rn, not able to participate at this time due to medication.   Claretha Cooper 04/26/2020, 11:19 AM [place] Pager [phone] Office [phone]

## 2020-04-26 NOTE — Progress Notes (Signed)
PROGRESS NOTE  Cynthia Thornton LEX:[id number] DOB: 19-Oct-1946 DOA: 04/23/2020 PCP: Colon Branch, MD  HPI/Recap of past 24 hours: HPI from Dr [name] is a 74 y.o. female with medical history significant of breast cancer, bronchiectasis, hypothyroid, osteopenia who has a 1 month history of ongoing dysuria.  Was treated with Macrobid by urgent care with a negative urine culture approximately 4 weeks ago.  Had noticed pus in her urine.  Today developed kidney/back pain with increased urgency and frequency.  Got an appointment with urology was found to have rigors, nausea/vomiting and fever. Pt was sent to the ED. In the ED, pt was noted to be hypotensive, labs somewhat unremarkable, CT stone showed perinephric soft tissue stranding on the right with mild dilatation of the right ureter, UA positive for infection.  Patient was started on IV antibiotics and admitted for further management.    Today, patient still reports poor appetite, with some nausea but denies any vomiting.  Denies any worsening abdominal pain, chest pain, shortness of breath, fever/chills.  Reports chronic back pain from scoliosis.    Assessment/Plan: Principal Problem:   Acute pyelonephritis Active Problems:   GERD   Osteopenia   Bronchiectasis (HCC)   Malignant neoplasm of lower-inner quadrant of left breast in female, estrogen receptor positive ([place])   Sepsis likely 2/2 E.coli bacteremia with acute pyelonephritis Hypotensive, tachycardic on admission Currently afebrile with no leukocytosis Lactic acid WNL UA positive for infection, UC growing E. coli Blood culture growing E. Coli CT renal stone showed perinephric soft tissue stranding on the right with mild dilatation of the right ureter, indicated above possibly recently passed stone S/P 2 g IV ceftriaxone, switch to IV ancef Continue IV fluids until appetite improves Monitor closely  Hypokalemia/hypocalcemia/acidosis Replace as needed Daily  BMP  Iron def Anemia/chronic disease Baseline hemoglobin around 11 No signs of bleeding, worsened hemodilutional Anemia panel with iron 14, sats 5%, Vit B12 205 (diet mainly vegan) Continue B12 and iron supplementation Daily CBC  Bronchiectasis Stable Flutter valve as needed  GERD Continue PPI, H2 blocker  History of breast cancer Currently in remission Follows with Dr. [name]       Malnutrition Type:      Malnutrition Characteristics:      Nutrition Interventions:       Estimated body mass index is 26.79 kg/m as calculated from the following:   Height as of this encounter: 5' 3.5" (1.613 m).   Weight as of this encounter: 69.7 kg.     Code Status: Full  Family Communication: Discussed with patient extensively  Disposition Plan: Status is: Inpatient  Remains inpatient appropriate because:Inpatient level of care appropriate due to severity of illness   Dispo: The patient is from: Home              Anticipated d/c is to: Home              Anticipated d/c date is: 2 days              Patient currently is not medically stable to d/c.     Consultants:  None  Procedures:  None  Antimicrobials:  Ancef  DVT prophylaxis: Lovenox   Objective: Vitals:   04/25/20 1420 04/25/20 2037 04/26/20 0510 04/26/20 1330  BP: 105/66 120/62 125/75 130/65  Pulse: 77 80 80 78  Resp: 16 18 20 18   Temp: 98 F (36.7 C) 99 F (37.2 C) 98.9 F (37.2 C) 98.3 F (36.8 C)  TempSrc: Oral Oral Oral Oral  SpO2: 98% 95% 96% 98%  Weight:      Height:        Intake/Output Summary (Last 24 hours) at 04/26/2020 1644 Last data filed at 04/26/2020 1443 Gross per 24 hour  Intake 240 ml  Output 0 ml  Net 240 ml   Filed Weights   04/24/20 1341  Weight: 69.7 kg    Exam:  General: NAD   Cardiovascular: S1, S2 present  Respiratory: CTAB  Abdomen: Soft, nontender, nondistended, bowel sounds present  Musculoskeletal: No bilateral pedal edema  noted  Skin: Normal  Psychiatry: Normal mood    Data Reviewed: CBC: Recent Labs  Lab 04/23/20 1504 04/23/20 2300 04/24/20 0500 04/25/20 0434 04/26/20 0439  WBC 8.8 9.2 7.4 7.3 4.4  NEUTROABS 7.4  --   --  5.0 2.5  HGB 11.0* 10.3* 8.4* 9.9* 9.5*  HCT 32.1* 29.8* 25.4* 29.5* 27.4*  MCV 89.7 89.0 92.4 90.2 88.7  PLT 206 196 151 196 154   Basic Metabolic Panel: Recent Labs  Lab 04/23/20 1615 04/23/20 2300 04/24/20 0500 04/25/20 0434 04/26/20 0439  NA 137  --  140 135 134*  K 3.7  --  3.1* 4.0 3.8  CL 103  --  114* 107 106  CO2 23  --  17* 22 18*  GLUCOSE 106*  --  84 91 93  BUN 18  --  9 10 9   CREATININE 0.94 1.02* 0.70 0.96 0.80  CALCIUM 8.4*  --  6.8* 8.0* 8.1*   GFR: Estimated Creatinine Clearance: 60.2 mL/min (by C-G formula based on SCr of 0.8 mg/dL). Liver Function Tests: No results for input(s): AST, ALT, ALKPHOS, BILITOT, PROT, ALBUMIN in the last 168 hours. No results for input(s): LIPASE, AMYLASE in the last 168 hours. No results for input(s): AMMONIA in the last 168 hours. Coagulation Profile: No results for input(s): INR, PROTIME in the last 168 hours. Cardiac Enzymes: No results for input(s): CKTOTAL, CKMB, CKMBINDEX, TROPONINI in the last 168 hours. BNP (last 3 results) No results for input(s): PROBNP in the last 8760 hours. HbA1C: No results for input(s): HGBA1C in the last 72 hours. CBG: Recent Labs  Lab 04/23/20 1555  GLUCAP 106*   Lipid Profile: No results for input(s): CHOL, HDL, LDLCALC, TRIG, CHOLHDL, LDLDIRECT in the last 72 hours. Thyroid Function Tests: No results for input(s): TSH, T4TOTAL, FREET4, T3FREE, THYROIDAB in the last 72 hours. Anemia Panel: Recent Labs    04/25/20 0434  VITAMINB12 205  FOLATE 23.0  FERRITIN 56  TIBC 270  IRON 14*   Urine analysis:    Component Value Date/Time   COLORURINE YELLOW 04/23/2020 1440   APPEARANCEUR CLOUDY (A) 04/23/2020 1440   LABSPEC 1.010 04/23/2020 1440   PHURINE 6.0  04/23/2020 1440   GLUCOSEU NEGATIVE 04/23/2020 1440   HGBUR MODERATE (A) 04/23/2020 1440   HGBUR moderate 10/14/2008 1615   BILIRUBINUR NEGATIVE 04/23/2020 1440   BILIRUBINUR Negative 09/04/2018 1643   KETONESUR NEGATIVE 04/23/2020 1440   PROTEINUR 100 (A) 04/23/2020 1440   UROBILINOGEN negative (A) 09/04/2018 1643   UROBILINOGEN 0.2 11/17/2013 1624   NITRITE NEGATIVE 04/23/2020 1440   LEUKOCYTESUR LARGE (A) 04/23/2020 1440   Sepsis Labs: @LABRCNTIP (procalcitonin:4,lacticidven:4)  ) Recent Results (from the past 240 hour(s))  Urine culture     Status: Abnormal   Collection Time: 04/23/20  2:40 PM   Specimen: Urine, Clean Catch  Result Value Ref Range Status   Specimen Description   Final  URINE, CLEAN CATCH Performed at [facility], [place] [address], [place], [place] [address]    Special Requests   Final    NONE Performed at [facility], [place] [address] [place], [place] [address]    Culture >=100,000 COLONIES/mL ESCHERICHIA COLI (A)  Final   Report Status 04/25/2020 FINAL  Final   Organism ID, Bacteria ESCHERICHIA COLI (A)  Final      Susceptibility   Escherichia coli - MIC*    AMPICILLIN >=32 RESISTANT Resistant     CEFAZOLIN <=4 SENSITIVE Sensitive     CEFTRIAXONE <=0.25 SENSITIVE Sensitive     CIPROFLOXACIN <=0.25 SENSITIVE Sensitive     GENTAMICIN <=1 SENSITIVE Sensitive     IMIPENEM <=0.25 SENSITIVE Sensitive     NITROFURANTOIN <=16 SENSITIVE Sensitive     TRIMETH/SULFA >=320 RESISTANT Resistant     AMPICILLIN/SULBACTAM >=32 RESISTANT Resistant     PIP/TAZO <=4 SENSITIVE Sensitive     * >=100,000 COLONIES/mL ESCHERICHIA COLI  Culture, blood (routine x 2)     Status: Abnormal   Collection Time: 04/23/20  3:04 PM   Specimen: BLOOD  Result Value Ref Range Status   Specimen Description   Final    BLOOD LEFT ANTECUBITAL Performed at [place] [address], [place], [place] [address]    Special  Requests   Final    BOTTLES DRAWN AEROBIC AND ANAEROBIC Blood Culture results may not be optimal due to an inadequate volume of blood received in culture bottles Performed at [facility] [address], [place], [place] [address]    Culture  Setup Time   Final    GRAM NEGATIVE RODS IN BOTH AEROBIC AND ANAEROBIC BOTTLES CRITICAL RESULT CALLED TO, READ BACK BY AND VERIFIED WITH: PHRMD J LEGGE @0650  04/24/20 BY S GEZAHEGN Performed at [facility], [place] [address], [place], [place] [address]    Culture ESCHERICHIA COLI (A)  Final   Report Status 04/26/2020 FINAL  Final   Organism ID, Bacteria ESCHERICHIA COLI  Final      Susceptibility   Escherichia coli - MIC*    AMPICILLIN >=32 RESISTANT Resistant     CEFAZOLIN <=4 SENSITIVE Sensitive     CEFEPIME <=0.12 SENSITIVE Sensitive     CEFTAZIDIME <=1 SENSITIVE Sensitive     CEFTRIAXONE <=0.25 SENSITIVE Sensitive     CIPROFLOXACIN <=0.25 SENSITIVE Sensitive     GENTAMICIN <=1 SENSITIVE Sensitive     IMIPENEM <=0.25 SENSITIVE Sensitive     TRIMETH/SULFA >=320 RESISTANT Resistant     AMPICILLIN/SULBACTAM 16 INTERMEDIATE Intermediate     PIP/TAZO <=4 SENSITIVE Sensitive     * ESCHERICHIA COLI  Culture, blood (routine x 2)     Status: None (Preliminary result)   Collection Time: 04/23/20  3:04 PM   Specimen: BLOOD  Result Value Ref Range Status   Specimen Description   Final    BLOOD RIGHT ANTECUBITAL Performed at [place] [address], [place], [place] [address]    Special Requests   Final    BOTTLES DRAWN AEROBIC AND ANAEROBIC Blood Culture results may not be optimal due to an inadequate volume of blood received in culture bottles Performed at [place] [address], [place], [place] [address]    Culture   Final    NO GROWTH 3 DAYS Performed at [facility], [place] [address], [place], [place] [address]    Report Status PENDING  Incomplete  Blood Culture  ID Panel (Reflexed)     Status: Abnormal   Collection Time: 04/23/20  3:04 PM  Result Value Ref Range Status   Enterococcus species NOT DETECTED NOT DETECTED Final   Listeria monocytogenes NOT DETECTED NOT DETECTED Final   Staphylococcus species NOT DETECTED NOT DETECTED Final   Staphylococcus aureus (BCID) NOT DETECTED NOT DETECTED Final   Streptococcus species NOT DETECTED NOT DETECTED Final   Streptococcus agalactiae NOT DETECTED NOT DETECTED Final   Streptococcus pneumoniae NOT DETECTED NOT DETECTED Final   Streptococcus pyogenes NOT DETECTED NOT DETECTED Final   Acinetobacter baumannii NOT DETECTED NOT DETECTED Final   Enterobacteriaceae species DETECTED (A) NOT DETECTED Final    Comment: Enterobacteriaceae represent a large family of gram-negative bacteria, not a single organism. CRITICAL RESULT CALLED TO, READ BACK BY AND VERIFIED WITH: PHRMD J LEGGE @0650  04/24/20 BY S GEZAHEGN    Enterobacter cloacae complex NOT DETECTED NOT DETECTED Final   Escherichia coli DETECTED (A) NOT DETECTED Final    Comment: CRITICAL RESULT CALLED TO, READ BACK BY AND VERIFIED WITH: PHRMD J LEGGE @0650  04/24/20 BY S GEZAHEGN    Klebsiella oxytoca NOT DETECTED NOT DETECTED Final   Klebsiella pneumoniae NOT DETECTED NOT DETECTED Final   Proteus species NOT DETECTED NOT DETECTED Final   Serratia marcescens NOT DETECTED NOT DETECTED Final   Carbapenem resistance NOT DETECTED NOT DETECTED Final   Haemophilus influenzae NOT DETECTED NOT DETECTED Final   Neisseria meningitidis NOT DETECTED NOT DETECTED Final   Pseudomonas aeruginosa NOT DETECTED NOT DETECTED Final   Candida albicans NOT DETECTED NOT DETECTED Final   Candida glabrata NOT DETECTED NOT DETECTED Final   Candida krusei NOT DETECTED NOT DETECTED Final   Candida parapsilosis NOT DETECTED NOT DETECTED Final   Candida tropicalis NOT DETECTED NOT DETECTED Final    Comment: Performed at [facility], [place] [address], [place], [place] [address]   SARS Coronavirus 2 by RT PCR (hospital order, performed in [facility] hospital lab) Nasopharyngeal Nasopharyngeal Swab     Status: None   Collection Time: 04/23/20  4:24 PM   Specimen: Nasopharyngeal Swab  Result Value Ref Range Status   SARS Coronavirus 2 NEGATIVE NEGATIVE Final    Comment: (NOTE) SARS-CoV-2 target nucleic acids are NOT DETECTED.  The SARS-CoV-2 RNA is generally detectable in upper and lower respiratory specimens during the acute phase of infection. The lowest concentration of SARS-CoV-2 viral copies this assay can detect is 250 copies / mL. A negative result does not preclude SARS-CoV-2 infection and should not be used as the sole basis for treatment or other patient management decisions.  A negative result may occur with improper specimen collection / handling, submission of specimen other than nasopharyngeal swab, presence of viral mutation(s) within the areas targeted by this assay, and inadequate number of viral copies (<250 copies / mL). A negative result must be combined with clinical observations, patient history, and epidemiological information.  Fact Sheet for Patients:   StrictlyIdeas.no  Fact Sheet for Healthcare Providers: BankingDealers.co.za  This test is not yet approved or  cleared by the [place] FDA and has been authorized for detection and/or diagnosis of SARS-CoV-2 by FDA under an Emergency Use Authorization (EUA).  This EUA will remain in effect (meaning this test can be used) for the duration of the COVID-19 declaration under Section 564(b)(1) of the Act, 21 U.S.C. section 360bbb-3(b)(1), unless the authorization is terminated or revoked sooner.  Performed at [facility], [place]  [address] [place], [place] [address]   Culture, blood (routine x 2)     Status: None (Preliminary result)   Collection Time: 04/26/20  4:39 AM   Specimen: BLOOD  Result Value Ref Range  Status   Specimen Description   Final    BLOOD RIGHT ARM Performed at [place] [address], [place], [place] [address]    Special Requests   Final    BOTTLES DRAWN AEROBIC AND ANAEROBIC Blood Culture adequate volume Performed at [place] [address], [place], [place] [address]    Culture   Final    NO GROWTH < 12 HOURS Performed at [place] [address], [place], [place] [address]    Report Status PENDING  Incomplete  Culture, blood (routine x 2)     Status: None (Preliminary result)   Collection Time: 04/26/20  4:45 AM   Specimen: BLOOD RIGHT HAND  Result Value Ref Range Status   Specimen Description   Final    BLOOD RIGHT HAND Performed at [facility] [address] [place], [place] [address]    Special Requests   Final    BOTTLES DRAWN AEROBIC AND ANAEROBIC Blood Culture adequate volume Performed at [place] [address], [place], [place] [address]    Culture   Final    NO GROWTH < 12 HOURS Performed at [facility] [address], [place], [place] [address]    Report Status PENDING  Incomplete      Studies: No results found.  Scheduled Meds: . aspirin EC  81 mg Oral Daily  . enoxaparin (LOVENOX) injection  40 mg Subcutaneous QHS  . famotidine  20 mg Oral QHS  . ferrous sulfate  325 mg Oral Q breakfast  . loratadine  10 mg Oral Daily  . omeprazole  40 mg Oral Q0600  . vitamin B-12  1,000 mcg Oral Daily    Continuous Infusions: . sodium chloride 75 mL/hr at 04/26/20 0738  .  ceFAZolin (ANCEF) IV       LOS: 2 days     Alma Friendly, MD [facility]  If 7PM-7AM, please contact night-coverage [email or web address] 04/26/2020, 4:44 PM

## 2020-04-27 ENCOUNTER — Inpatient Hospital Stay (HOSPITAL_COMMUNITY): Payer: Medicare PPO

## 2020-04-27 LAB — CBC WITH DIFFERENTIAL/PLATELET
Abs Immature Granulocytes: 0.03 10*3/uL (ref 0.00–0.07)
Basophils Absolute: 0 10*3/uL (ref 0.0–0.1)
Basophils Relative: 0 %
Eosinophils Absolute: 0.4 10*3/uL (ref 0.0–0.5)
Eosinophils Relative: 9 %
HCT: 28.2 % — ABNORMAL LOW (ref 36.0–46.0)
Hemoglobin: 9.8 g/dL — ABNORMAL LOW (ref 12.0–15.0)
Immature Granulocytes: 1 %
Lymphocytes Relative: 25 %
Lymphs Abs: 1.3 10*3/uL (ref 0.7–4.0)
MCH: 30.7 pg (ref 26.0–34.0)
MCHC: 34.8 g/dL (ref 30.0–36.0)
MCV: 88.4 fL (ref 80.0–100.0)
Monocytes Absolute: 0.6 10*3/uL (ref 0.1–1.0)
Monocytes Relative: 11 %
Neutro Abs: 2.7 10*3/uL (ref 1.7–7.7)
Neutrophils Relative %: 54 %
Platelets: 201 10*3/uL (ref 150–400)
RBC: 3.19 MIL/uL — ABNORMAL LOW (ref 3.87–5.11)
RDW: 13.2 % (ref 11.5–15.5)
WBC: 5 10*3/uL (ref 4.0–10.5)
nRBC: 0 % (ref 0.0–0.2)

## 2020-04-27 LAB — BASIC METABOLIC PANEL
Anion gap: 10 (ref 5–15)
BUN: 12 mg/dL (ref 8–23)
CO2: 22 mmol/L (ref 22–32)
Calcium: 7.9 mg/dL — ABNORMAL LOW (ref 8.9–10.3)
Chloride: 103 mmol/L (ref 98–111)
Creatinine, Ser: 1.04 mg/dL — ABNORMAL HIGH (ref 0.44–1.00)
GFR calc Af Amer: >60 mL/min (ref >60)
GFR calc non Af Amer: 54 mL/min — ABNORMAL LOW (ref >60)
Glucose, Bld: 163 mg/dL — ABNORMAL HIGH (ref 70–99)
Potassium: 3.4 mmol/L — ABNORMAL LOW (ref 3.5–5.1)
Sodium: 135 mmol/L (ref 135–145)

## 2020-04-27 MED ORDER — POTASSIUM CHLORIDE CRYS ER 20 MEQ PO TBCR
40.0000 meq | EXTENDED_RELEASE_TABLET | Freq: Once | ORAL | Status: AC
  Administered 2020-04-27: 40 meq via ORAL
  Filled 2020-04-27: qty 2

## 2020-04-27 NOTE — Progress Notes (Signed)
PROGRESS NOTE  Cynthia Thornton QPR:[id number] DOB: [birth year] DOA: 04/23/2020 PCP: Colon Branch, MD  HPI/Recap of past 24 hours: HPI from Dr Cynthia Thornton is a 74 y.o. female with medical history significant of breast cancer, bronchiectasis, hypothyroid, osteopenia who has a 1 month history of ongoing dysuria.  Was treated with Macrobid by urgent care with a negative urine culture approximately 4 weeks ago.  Had noticed pus in her urine.  Today developed kidney/back pain with increased urgency and frequency.  Got an appointment with urology was found to have rigors, nausea/vomiting and fever. Pt was sent to the ED. In the ED, pt was noted to be hypotensive, labs somewhat unremarkable, CT stone showed perinephric soft tissue stranding on the right with mild dilatation of the right ureter, UA positive for infection.  Patient was started on IV antibiotics and admitted for further management.    Today, patient reported feeling better, appetite is much better, planned on discharging, until later this evening complained about some abdominal discomfort and some diarrhea, thinks is related to iron pills.  Patient denied any chest pain, shortness of breath, reports some cough, denies any nausea/vomiting, fever/chills.    Assessment/Plan: Principal Problem:   Acute pyelonephritis Active Problems:   GERD   Osteopenia   Bronchiectasis (HCC)   Malignant neoplasm of lower-inner quadrant of left breast in female, estrogen receptor positive ([name])   Sepsis likely 2/2 E.coli bacteremia with acute pyelonephritis Hypotensive, tachycardic on admission Currently afebrile with no leukocytosis Lactic acid WNL UA positive for infection, UC growing E. coli Blood culture growing E. Coli CT renal stone showed perinephric soft tissue stranding on the right with mild dilatation of the right ureter, indicated above possibly recently passed stone S/P 2 g IV ceftriaxone, switch to IV ancef, may switch to Keflex  to complete 7 to 10 days of IV antibiotics Status post IV fluids Monitor closely  Hypokalemia/hypocalcemia/acidosis Replace as needed Daily BMP  Iron def Anemia/chronic disease Baseline hemoglobin around 11 No signs of bleeding, worsened hemodilutional Anemia panel with iron 14, sats 5%, Vit B12 205 (diet mainly vegan) Continue B12 and iron supplementation Daily CBC  Bronchiectasis Stable Flutter valve as needed  GERD Continue PPI, H2 blocker  History of breast cancer Currently in remission Follows with Dr. [name]       Malnutrition Type:      Malnutrition Characteristics:      Nutrition Interventions:       Estimated body mass index is 26.79 kg/m as calculated from the following:   Height as of this encounter: 5' 3.5" (1.613 m).   Weight as of this encounter: 69.7 kg.     Code Status: Full  Family Communication: Discussed with patient daughter-in-law Elmo Putt, on 9/93/5701  Disposition Plan: Status is: Inpatient  Remains inpatient appropriate because:Inpatient level of care appropriate due to severity of illness   Dispo: The patient is from: Home              Anticipated d/c is to: Home              Anticipated d/c date is: 1 day              Patient currently is not medically stable to d/c.     Consultants:  None  Procedures:  None  Antimicrobials:  Ancef  DVT prophylaxis: Lovenox   Objective: Vitals:   04/26/20 1330 04/26/20 2035 04/27/20 0447 04/27/20 0910  BP: 130/65 119/71 113/68   Pulse: 78  89 93 (!) 104  Resp: 18 19 18    Temp: 98.3 F (36.8 C) 98.1 F (36.7 C) 98.6 F (37 C)   TempSrc: Oral     SpO2: 98% 97% 95% 100%  Weight:      Height:        Intake/Output Summary (Last 24 hours) at 04/27/2020 1831 Last data filed at 04/27/2020 0400 Gross per 24 hour  Intake 1810 ml  Output --  Net 1810 ml   Filed Weights   04/24/20 1341  Weight: 69.7 kg    Exam:  General: NAD   Cardiovascular: S1, S2  present  Respiratory: CTAB  Abdomen: Soft, nontender, nondistended, bowel sounds present  Musculoskeletal: No bilateral pedal edema noted  Skin: Normal  Psychiatry: Normal mood    Data Reviewed: CBC: Recent Labs  Lab 04/23/20 1504 04/23/20 1504 04/23/20 2300 04/24/20 0500 04/25/20 0434 04/26/20 0439 04/27/20 0351  WBC 8.8   < > 9.2 7.4 7.3 4.4 5.0  NEUTROABS 7.4  --   --   --  5.0 2.5 2.7  HGB 11.0*   < > 10.3* 8.4* 9.9* 9.5* 9.8*  HCT 32.1*   < > 29.8* 25.4* 29.5* 27.4* 28.2*  MCV 89.7   < > 89.0 92.4 90.2 88.7 88.4  PLT 206   < > 196 151 196 192 201   < > = values in this interval not displayed.   Basic Metabolic Panel: Recent Labs  Lab 04/23/20 1615 04/23/20 1615 04/23/20 2300 04/24/20 0500 04/25/20 0434 04/26/20 0439 04/27/20 0351  NA 137  --   --  140 135 134* 135  K 3.7  --   --  3.1* 4.0 3.8 3.4*  CL 103  --   --  114* 107 106 103  CO2 23  --   --  17* 22 18* 22  GLUCOSE 106*  --   --  84 91 93 163*  BUN 18  --   --  9 10 9 12   CREATININE 0.94   < > 1.02* 0.70 0.96 0.80 1.04*  CALCIUM 8.4*  --   --  6.8* 8.0* 8.1* 7.9*   < > = values in this interval not displayed.   GFR: Estimated Creatinine Clearance: 46.3 mL/min (A) (by C-G formula based on SCr of 1.04 mg/dL (H)). Liver Function Tests: No results for input(s): AST, ALT, ALKPHOS, BILITOT, PROT, ALBUMIN in the last 168 hours. No results for input(s): LIPASE, AMYLASE in the last 168 hours. No results for input(s): AMMONIA in the last 168 hours. Coagulation Profile: No results for input(s): INR, PROTIME in the last 168 hours. Cardiac Enzymes: No results for input(s): CKTOTAL, CKMB, CKMBINDEX, TROPONINI in the last 168 hours. BNP (last 3 results) No results for input(s): PROBNP in the last 8760 hours. HbA1C: No results for input(s): HGBA1C in the last 72 hours. CBG: Recent Labs  Lab 04/23/20 1555  GLUCAP 106*   Lipid Profile: No results for input(s): CHOL, HDL, LDLCALC, TRIG, CHOLHDL,  LDLDIRECT in the last 72 hours. Thyroid Function Tests: No results for input(s): TSH, T4TOTAL, FREET4, T3FREE, THYROIDAB in the last 72 hours. Anemia Panel: Recent Labs    04/25/20 0434  VITAMINB12 205  FOLATE 23.0  FERRITIN 56  TIBC 270  IRON 14*   Urine analysis:    Component Value Date/Time   COLORURINE YELLOW 04/23/2020 1440   APPEARANCEUR CLOUDY (A) 04/23/2020 1440   LABSPEC 1.010 04/23/2020 1440   PHURINE 6.0 04/23/2020 1440   GLUCOSEU NEGATIVE  04/23/2020 1440   HGBUR MODERATE (A) 04/23/2020 1440   HGBUR moderate 10/14/2008 1615   BILIRUBINUR NEGATIVE 04/23/2020 1440   BILIRUBINUR Negative 09/04/2018 1643   KETONESUR NEGATIVE 04/23/2020 1440   PROTEINUR 100 (A) 04/23/2020 1440   UROBILINOGEN negative (A) 09/04/2018 1643   UROBILINOGEN 0.2 11/17/2013 1624   NITRITE NEGATIVE 04/23/2020 1440   LEUKOCYTESUR LARGE (A) 04/23/2020 1440   Sepsis Labs: @LABRCNTIP (procalcitonin:4,lacticidven:4)  ) Recent Results (from the past 240 hour(s))  Urine culture     Status: Abnormal   Collection Time: 04/23/20  2:40 PM   Specimen: Urine, Clean Catch  Result Value Ref Range Status   Specimen Description   Final    URINE, CLEAN CATCH Performed at [facility], [place] [address], [place], [place] [address]    Special Requests   Final    NONE Performed at [facility], [place] [address], [place], [place] [address]    Culture >=100,000 COLONIES/mL ESCHERICHIA COLI (A)  Final   Report Status 04/25/2020 FINAL  Final   Organism ID, Bacteria ESCHERICHIA COLI (A)  Final      Susceptibility   Escherichia coli - MIC*    AMPICILLIN >=32 RESISTANT Resistant     CEFAZOLIN <=4 SENSITIVE Sensitive     CEFTRIAXONE <=0.25 SENSITIVE Sensitive     CIPROFLOXACIN <=0.25 SENSITIVE Sensitive     GENTAMICIN <=1 SENSITIVE Sensitive     IMIPENEM <=0.25 SENSITIVE Sensitive     NITROFURANTOIN <=16 SENSITIVE Sensitive     TRIMETH/SULFA >=320 RESISTANT Resistant      AMPICILLIN/SULBACTAM >=32 RESISTANT Resistant     PIP/TAZO <=4 SENSITIVE Sensitive     * >=100,000 COLONIES/mL ESCHERICHIA COLI  Culture, blood (routine x 2)     Status: Abnormal   Collection Time: 04/23/20  3:04 PM   Specimen: BLOOD  Result Value Ref Range Status   Specimen Description   Final    BLOOD LEFT ANTECUBITAL Performed at [facility] [address], [place], [place] [address]    Special Requests   Final    BOTTLES DRAWN AEROBIC AND ANAEROBIC Blood Culture results may not be optimal due to an inadequate volume of blood received in culture bottles Performed at [facility] [address], [place], [place] [address]    Culture  Setup Time   Final    GRAM NEGATIVE RODS IN BOTH AEROBIC AND ANAEROBIC BOTTLES CRITICAL RESULT CALLED TO, READ BACK BY AND VERIFIED WITH: PHRMD J LEGGE @0650  04/24/20 BY S GEZAHEGN Performed at [facility], [address], [place], [place] [address]    Culture ESCHERICHIA COLI (A)  Final   Report Status 04/26/2020 FINAL  Final   Organism ID, Bacteria ESCHERICHIA COLI  Final      Susceptibility   Escherichia coli - MIC*    AMPICILLIN >=32 RESISTANT Resistant     CEFAZOLIN <=4 SENSITIVE Sensitive     CEFEPIME <=0.12 SENSITIVE Sensitive     CEFTAZIDIME <=1 SENSITIVE Sensitive     CEFTRIAXONE <=0.25 SENSITIVE Sensitive     CIPROFLOXACIN <=0.25 SENSITIVE Sensitive     GENTAMICIN <=1 SENSITIVE Sensitive     IMIPENEM <=0.25 SENSITIVE Sensitive     TRIMETH/SULFA >=320 RESISTANT Resistant     AMPICILLIN/SULBACTAM 16 INTERMEDIATE Intermediate     PIP/TAZO <=4 SENSITIVE Sensitive     * ESCHERICHIA COLI  Culture, blood (routine x 2)     Status: None (Preliminary result)   Collection Time: 04/23/20  3:04 PM  Specimen: BLOOD  Result Value Ref Range Status   Specimen Description   Final    BLOOD RIGHT ANTECUBITAL Performed at [facility] [address], [place], [place] [address]     Special Requests   Final    BOTTLES DRAWN AEROBIC AND ANAEROBIC Blood Culture results may not be optimal due to an inadequate volume of blood received in culture bottles Performed at [facility] [address], [place], [place] [address]    Culture   Final    NO GROWTH 4 DAYS Performed at [facility], [place] [address] [place], [place] [address]    Report Status PENDING  Incomplete  Blood Culture ID Panel (Reflexed)     Status: Abnormal   Collection Time: 04/23/20  3:04 PM  Result Value Ref Range Status   Enterococcus species NOT DETECTED NOT DETECTED Final   Listeria monocytogenes NOT DETECTED NOT DETECTED Final   Staphylococcus species NOT DETECTED NOT DETECTED Final   Staphylococcus aureus (BCID) NOT DETECTED NOT DETECTED Final   Streptococcus species NOT DETECTED NOT DETECTED Final   Streptococcus agalactiae NOT DETECTED NOT DETECTED Final   Streptococcus pneumoniae NOT DETECTED NOT DETECTED Final   Streptococcus pyogenes NOT DETECTED NOT DETECTED Final   Acinetobacter baumannii NOT DETECTED NOT DETECTED Final   Enterobacteriaceae species DETECTED (A) NOT DETECTED Final    Comment: Enterobacteriaceae represent a large family of gram-negative bacteria, not a single organism. CRITICAL RESULT CALLED TO, READ BACK BY AND VERIFIED WITH: PHRMD J LEGGE @0650  04/24/20 BY S GEZAHEGN    Enterobacter cloacae complex NOT DETECTED NOT DETECTED Final   Escherichia coli DETECTED (A) NOT DETECTED Final    Comment: CRITICAL RESULT CALLED TO, READ BACK BY AND VERIFIED WITH: PHRMD J LEGGE @0650  04/24/20 BY S GEZAHEGN    Klebsiella oxytoca NOT DETECTED NOT DETECTED Final   Klebsiella pneumoniae NOT DETECTED NOT DETECTED Final   Proteus species NOT DETECTED NOT DETECTED Final   Serratia marcescens NOT DETECTED NOT DETECTED Final   Carbapenem resistance NOT DETECTED NOT DETECTED Final   Haemophilus influenzae NOT DETECTED NOT DETECTED Final   Neisseria meningitidis NOT  DETECTED NOT DETECTED Final   Pseudomonas aeruginosa NOT DETECTED NOT DETECTED Final   Candida albicans NOT DETECTED NOT DETECTED Final   Candida glabrata NOT DETECTED NOT DETECTED Final   Candida krusei NOT DETECTED NOT DETECTED Final   Candida parapsilosis NOT DETECTED NOT DETECTED Final   Candida tropicalis NOT DETECTED NOT DETECTED Final    Comment: Performed at [facility], [place] [address], [place],  [address]  SARS Coronavirus 2 by RT PCR (hospital order, performed in [facility] hospital lab) Nasopharyngeal Nasopharyngeal Swab     Status: None   Collection Time: 04/23/20  4:24 PM   Specimen: Nasopharyngeal Swab  Result Value Ref Range Status   SARS Coronavirus 2 NEGATIVE NEGATIVE Final    Comment: (NOTE) SARS-CoV-2 target nucleic acids are NOT DETECTED.  The SARS-CoV-2 RNA is generally detectable in upper and lower respiratory specimens during the acute phase of infection. The lowest concentration of SARS-CoV-2 viral copies this assay can detect is 250 copies / mL. A negative result does not preclude SARS-CoV-2 infection and should not be used as the sole basis for treatment or other patient management decisions.  A negative result may occur with improper specimen collection / handling, submission of specimen other than nasopharyngeal swab, presence of viral mutation(s) within the areas targeted by this assay, and inadequate  number of viral copies (<250 copies / mL). A negative result must be combined with clinical observations, patient history, and epidemiological information.  Fact Sheet for Patients:   StrictlyIdeas.no  Fact Sheet for Healthcare Providers: BankingDealers.co.za  This test is not yet approved or  cleared by the [place] FDA and has been authorized for detection and/or diagnosis of SARS-CoV-2 by FDA under an Emergency Use Authorization (EUA).  This EUA will remain in effect (meaning this test  can be used) for the duration of the COVID-19 declaration under Section 564(b)(1) of the Act, 21 U.S.C. section 360bbb-3(b)(1), unless the authorization is terminated or revoked sooner.  Performed at [facility], [place] [address] [place], [place] [address]   Culture, blood (routine x 2)     Status: None (Preliminary result)   Collection Time: 04/26/20  4:39 AM   Specimen: BLOOD  Result Value Ref Range Status   Specimen Description   Final    BLOOD RIGHT ARM Performed at [place] [address], [place], [place] [address]    Special Requests   Final    BOTTLES DRAWN AEROBIC AND ANAEROBIC Blood Culture adequate volume Performed at [facility] [address], [place], [place] [address]    Culture   Final    NO GROWTH < 24 HOURS Performed at [facility] [address], [place], [place] [address]    Report Status PENDING  Incomplete  Culture, blood (routine x 2)     Status: None (Preliminary result)   Collection Time: 04/26/20  4:45 AM   Specimen: BLOOD RIGHT HAND  Result Value Ref Range Status   Specimen Description   Final    BLOOD RIGHT HAND Performed at [facility] [address], [place], [place] [address]    Special Requests   Final    BOTTLES DRAWN AEROBIC AND ANAEROBIC Blood Culture adequate volume Performed at [facility] [address], [place], [place] [address]    Culture   Final    NO GROWTH < 24 HOURS Performed at [facility] [address], [place], [place] [address]    Report Status PENDING  Incomplete      Studies: DG Chest Port 1 View  Result Date: 04/27/2020 CLINICAL DATA:  Cough EXAM: PORTABLE CHEST 1 VIEW COMPARISON:  05/30/2019 FINDINGS: There is hyperinflation of the lungs compatible with COPD. Cardiomegaly. No confluent opacities or effusions. No acute bony abnormality. IMPRESSION: Cardiomegaly, COPD.  No active disease.  Electronically Signed   By: Rolm Baptise M.D.   On: 04/27/2020 17:49    Scheduled Meds: . aspirin EC  81 mg Oral Daily  . enoxaparin (LOVENOX) injection  40 mg Subcutaneous QHS  . famotidine  20 mg Oral QHS  . ferrous sulfate  325 mg Oral Q breakfast  . loratadine  10 mg Oral Daily  . omeprazole  40 mg Oral Q0600  . vitamin B-12  1,000 mcg Oral Daily    Continuous Infusions: .  ceFAZolin (ANCEF) IV 2 g (04/27/20 1508)     LOS: 3 days     Alma Friendly, MD [facility]  If 7PM-7AM, please contact night-coverage [email or web address] 04/27/2020, 6:31 PM

## 2020-04-27 NOTE — Care Management Important Message (Signed)
Important Message  Patient Details IM Letter given to Evette Cristal SW Case Manager to present to the Patient Name: Cynthia Thornton MRN: [id number] Date of Birth: Apr 18, 1946   Medicare Important Message Given:  Yes     Kerin Salen 04/27/2020, 10:01 AM

## 2020-04-27 NOTE — Evaluation (Signed)
Occupational Therapy Evaluation and Discharge Patient Details Name: Cynthia Thornton MRN: [id number] DOB: 1946-09-19 Today's Date: 04/27/2020    History of Present Illness Cynthia Thornton is a 74 y.o. female with medical history significant of breast cancer, OA behind knee caps, bronchiectasis, hypothyroid, osteopenia who has a 1 month history of ongoing dysuria found to have acute pyelonephritis.   Clinical Impression   This 74 yo female admitted with above presents to acute OT with mild sway in ambulation balance but no LOB, Mod I with basic ADLs (increased time). No further OT needs, we will sign off.    Follow Up Recommendations  No OT follow up    Equipment Recommendations  None recommended by OT       Precautions / Restrictions Precautions Precautions: None Restrictions Weight Bearing Restrictions: No      Mobility Bed Mobility               General bed mobility comments: Pt up in bathroom upon my arrival  Transfers Overall transfer level: Modified independent               General transfer comment: slower for sit<>stand to OA behind knee caps (per pt). Pt wanted to ambulate around the entire unit to see how she felf--she did so with Mod I.    Balance Overall balance assessment: Mild deficits observed, not formally tested (with ambulation)                                         ADL either performed or assessed with clinical judgement   ADL Overall ADL's : Modified independent                                       General ADL Comments: Increased time right now due to increased fatigue over her normal for every day activities     Vision Patient Visual Report: No change from baseline              Pertinent Vitals/Pain Pain Assessment: No/denies pain     Hand Dominance Right   Extremity/Trunk Assessment Upper Extremity Assessment Upper Extremity Assessment: Overall WFL for tasks assessed            Communication Communication Communication: No difficulties   Cognition Arousal/Alertness: Awake/alert Behavior During Therapy: WFL for tasks assessed/performed Overall Cognitive Status: Within Functional Limits for tasks assessed                                                Home Living Family/patient expects to be discharged to:: Private residence Living Arrangements: Alone   Type of Home: House Home Access: Stairs to enter [employer] of Steps: 3 Entrance Stairs-Rails: Right Home Layout: One level     Bathroom Shower/Tub: Walk-in shower;Door   Bathroom Toilet: Handicapped height     Home Equipment: None;Shower seat - built in;Grab bars - tub/shower          Prior Functioning/Environment Level of Independence: Independent        Comments: 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 [employer]        OT Problem List: Impaired balance (sitting and/or standing)  OT Goals(Current goals can be found in the care plan section) Acute Rehab OT Goals Patient Stated Goal: to go home and get back to work  OT Frequency:                AM-PAC OT "6 Clicks" Daily Activity     Outcome Measure Help from another person eating meals?: None Help from another person taking care of personal grooming?: None Help from another person toileting, which includes using toliet, bedpan, or urinal?: None Help from another person bathing (including washing, rinsing, drying)?: None Help from another person to put on and taking off regular upper body clothing?: None Help from another person to put on and taking off regular lower body clothing?: None 6 Click Score: 24   End of Session Equipment Utilized During Treatment: Gait belt Nurse Communication: Mobility status (HR)  Activity Tolerance: Patient tolerated treatment well Patient left: in chair;with call bell/phone within reach  OT Visit Diagnosis: Unsteadiness on feet (R26.81);Muscle weakness (generalized)  (M62.81)                Time: 8592-9244 OT Time Calculation (min): 26 min Charges:  OT General Charges $OT Visit: 1 Visit OT Evaluation $OT Eval Moderate Complexity: 1 Mod OT Treatments $Self Care/Home Management : 8-22 mins  [place] [address], OTR/L Acute [employer] Pager [phone] Office [phone]     Almon Register 04/27/2020, 9:16 AM

## 2020-04-27 NOTE — Progress Notes (Signed)
PT Cancellation Note  Patient Details Name: Cynthia Thornton MRN: [id number] DOB: 1945/11/20   Cancelled Treatment:    Reason Eval/Treat Not Completed: Other (comment), patient ambulated earlier. Will see in am and practice steps.   Claretha Cooper 04/27/2020, 4:08 PM  [place] Pager [phone] Office [phone]

## 2020-04-28 LAB — CBC WITH DIFFERENTIAL/PLATELET
Abs Immature Granulocytes: 0.07 10*3/uL (ref 0.00–0.07)
Basophils Absolute: 0 10*3/uL (ref 0.0–0.1)
Basophils Relative: 1 %
Eosinophils Absolute: 0.4 10*3/uL (ref 0.0–0.5)
Eosinophils Relative: 8 %
HCT: 28.5 % — ABNORMAL LOW (ref 36.0–46.0)
Hemoglobin: 9.9 g/dL — ABNORMAL LOW (ref 12.0–15.0)
Immature Granulocytes: 1 %
Lymphocytes Relative: 25 %
Lymphs Abs: 1.4 10*3/uL (ref 0.7–4.0)
MCH: 30 pg (ref 26.0–34.0)
MCHC: 34.7 g/dL (ref 30.0–36.0)
MCV: 86.4 fL (ref 80.0–100.0)
Monocytes Absolute: 0.9 10*3/uL (ref 0.1–1.0)
Monocytes Relative: 15 %
Neutro Abs: 2.9 10*3/uL (ref 1.7–7.7)
Neutrophils Relative %: 50 %
Platelets: 235 10*3/uL (ref 150–400)
RBC: 3.3 MIL/uL — ABNORMAL LOW (ref 3.87–5.11)
RDW: 13.1 % (ref 11.5–15.5)
WBC: 5.8 10*3/uL (ref 4.0–10.5)
nRBC: 0 % (ref 0.0–0.2)

## 2020-04-28 LAB — BASIC METABOLIC PANEL
Anion gap: 9 (ref 5–15)
BUN: 13 mg/dL (ref 8–23)
CO2: 26 mmol/L (ref 22–32)
Calcium: 8.7 mg/dL — ABNORMAL LOW (ref 8.9–10.3)
Chloride: 105 mmol/L (ref 98–111)
Creatinine, Ser: 0.92 mg/dL (ref 0.44–1.00)
GFR calc Af Amer: >60 mL/min (ref >60)
GFR calc non Af Amer: >60 mL/min (ref >60)
Glucose, Bld: 98 mg/dL (ref 70–99)
Potassium: 3.7 mmol/L (ref 3.5–5.1)
Sodium: 140 mmol/L (ref 135–145)

## 2020-04-28 LAB — CULTURE, BLOOD (ROUTINE X 2): Culture: NO GROWTH

## 2020-04-28 MED ORDER — FERROUS SULFATE 325 (65 FE) MG PO TABS: 325.0000 mg | ORAL_TABLET | Freq: Every day | ORAL | 1 refills | Status: DC

## 2020-04-28 MED ORDER — CEFDINIR 300 MG PO CAPS
300.0000 mg | ORAL_CAPSULE | Freq: Two times a day (BID) | ORAL | 0 refills | Status: AC
Start: 2020-04-28 — End: 2020-05-03

## 2020-04-28 NOTE — Progress Notes (Signed)
Discharge instructions given with stated understanding.  Patient awaiting transportation home at this time

## 2020-04-28 NOTE — Progress Notes (Signed)
PT Cancellation Note  Patient Details Name: JALINE PINCOCK MRN: [id number] DOB: 25-May-1946   Cancelled Treatment:    Reason Eval/Treat Not Completed: PT screened, no needs identified, will sign off   Claretha Cooper 04/28/2020, 8:39 AM  [place] Pager [phone] Office [phone]

## 2020-04-28 NOTE — Discharge Instructions (Signed)
Pyelonephritis, Adult  Pyelonephritis is an infection that occurs in the kidney. The kidneys are organs that help clean the blood by moving waste out of the blood and into the pee (urine). This infection can happen quickly, or it can last for a long time. In most cases, it clears up with treatment and does not cause other problems. What are the causes? This condition may be caused by:  Germs (bacteria) going from the bladder up to the kidney. This may happen after having a bladder infection.  Germs going from the blood to the kidney. What increases the risk? This condition is more likely to develop in:  Pregnant women.  Older people.  People who have any of these conditions: ? Diabetes. ? Inflammation of the prostate gland (prostatitis), in males. ? Kidney stones or bladder stones. ? Other problems with the kidney or the parts of your body that carry pee from the kidneys to the bladder (ureters). ? Cancer.  People who have a small, thin tube (catheter) placed in the bladder.  People who are sexually active.  Women who use a medicine that kills sperm (spermicide) to prevent pregnancy.  People who have had a prior urinary tract infection (UTI). What are the signs or symptoms? Symptoms of this condition include:  Peeing often.  A strong urge to pee right away.  Burning or stinging when peeing.  Belly pain.  Back pain.  Pain in the side (flank area).  Fever or chills.  Blood in the pee, or dark pee.  Feeling sick to your stomach (nauseous) or throwing up (vomiting). How is this treated? This condition may be treated by:  Taking antibiotic medicines by mouth (orally).  Drinking enough fluids. If the infection is bad, you may need to stay in the hospital. You may be given antibiotics and fluids that are put directly into a vein through an IV tube. In some cases, other treatments may be needed. Follow these instructions at home: Medicines  Take your antibiotic  medicine as told by your doctor. Do not stop taking the antibiotic even if you start to feel better.  Take over-the-counter and prescription medicines only as told by your doctor. General instructions   Drink enough fluid to keep your pee pale yellow.  Avoid caffeine, tea, and carbonated drinks.  Pee (urinate) often. Avoid holding in pee for long periods of time.  Pee before and after sex.  After pooping (having a bowel movement), women should wipe from front to back. Use each tissue only once.  Keep all follow-up visits as told by your doctor. This is important. Contact a doctor if:  You do not feel better after 2 days.  Your symptoms get worse.  You have a fever. Get help right away if:  You cannot take your medicine or drink fluids as told.  You have chills and shaking.  You throw up.  You have very bad pain in your side or back.  You feel very weak or you pass out (faint). Summary  Pyelonephritis is an infection that occurs in the kidney.  In most cases, this infection clears up with treatment and does not cause other problems.  Take your antibiotic medicine as told by your doctor. Do not stop taking the antibiotic even if you start to feel better.  Drink enough fluid to keep your pee pale yellow. This information is not intended to replace advice given to you by your health care provider. Make sure you discuss any questions you have with  your health care provider. Document Revised: 08/20/2018 Document Reviewed: 08/20/2018 [employer] Patient Education  2020 [employer]  

## 2020-04-28 NOTE — Discharge Summary (Signed)
Physician Discharge Summary  Cynthia Thornton:[id number] DOB: [birth year] DOA: 04/23/2020  PCP: Colon Branch, MD  Admit date: 04/23/2020 Discharge date: 04/28/2020  Admitted From: Home Disposition:  Home  Discharge Condition:Stable CODE STATUS:FULL Diet recommendation: Heart Healthy  Brief/Interim Summary: HPI : Cynthia Berkery Whiteis a 74 y.o.femalewith medical history significant ofbreast cancer, bronchiectasis, hypothyroid, osteopenia who has a 1 month history of ongoing dysuria. Was treated with Macrobid by urgent care with a negative urine culture approximately 4 weeks ago. Had noticed pus in her urine. Today developed kidney/back pain with increased urgency and frequency. Got an appointment with urology was found to have rigors, nausea/vomiting and fever. Pt was sent to the ED. In the ED, pt was noted to be hypotensive, labs somewhat unremarkable, CT stone showed perinephric soft tissue stranding on the right with mild dilatation of the right ureter, UA positive for infection.  Patient was started on IV antibiotics and admitted for further management.  Hospital Course: Patient's hospital course remained stable.  Her blood culture showed pansensitive E. coli.  Patient is currently hemodynamically stable, afebrile.  Antibiotics has been changed to oral today.  She is stable for discharge medically.  Following problems were addressed during her hospitalization:  Sepsis likely 2/2 E.coli bacteremia with acute pyelonephritis Hypotensive, tachycardic on admission Currently afebrile with no leukocytosis Lactic acid WNL UA positive for infection, UC showed  E. coli Blood culture positive for  E. Coli CT renal stone showed perinephric soft tissue stranding on the right with mild dilatation of the right ureter, indicated above possibly recently passed stone S/P 2 g IV ceftriaxone, switched to IV ancef, changed to omnicef on DC to complete 10 days  course  Hypokalemia/hypocalcemia/acidosis Supplemented and corrected  Iron def Anemia/chronic disease Baseline hemoglobin around 11 No signs of bleeding, worsened hemodilutional Anemia panel with iron 14 Continue iron supplementation  Bronchiectasis Stable No wheezing or rhonci  GERD Continue PPI, H2 blocker  History of breast cancer Currently in remission Follows with Dr. [name]    Discharge Diagnoses:  Principal Problem:   Acute pyelonephritis Active Problems:   GERD   Osteopenia   Bronchiectasis ([place])   Malignant neoplasm of lower-inner quadrant of left breast in female, estrogen receptor positive ([place])    Discharge Instructions  Discharge Instructions    Diet - low sodium heart healthy   Complete by: As directed    Discharge instructions   Complete by: As directed    1)Please take prescribed medications as instructed. 2)Follow up with your PCP in a week.   Increase activity slowly   Complete by: As directed      Allergies as of 04/28/2020      Reactions   Doxycycline Rash   Other Other (See Comments)   Surgical glue : hives   Oxycodone Nausea And Vomiting   Sulfa Antibiotics Hives   Trimethoprim Sulfate [trimethoprim]    rash   Codeine Nausea Only   Propranolol Hcl Other (See Comments)   "Dizziness and heart racing"      Medication List    TAKE these medications   aspirin EC 81 MG tablet Take 81 mg by mouth daily.   B-complex with vitamin C tablet Take 1 tablet by mouth daily.   cefdinir 300 MG capsule Commonly known as: OMNICEF Take 1 capsule (300 mg total) by mouth 2 (two) times daily for 5 days.   cetirizine 10 MG tablet Commonly known as: ZYRTEC Take 10 mg by mouth daily.   clorazepate 7.5 MG  tablet Commonly known as: TRANXENE TAKE 1 TABLET (7.5 MG TOTAL) BY MOUTH AT BEDTIME AS NEEDED FOR ANXIETY.   famotidine 20 MG tablet Commonly known as: PEPCID Take 1 tablet (20 mg total) by mouth 2 (two) times daily. What  changed: when to take this   ferrous sulfate 325 (65 FE) MG tablet Take 1 tablet (325 mg total) by mouth daily with breakfast. Start taking on: April 29, 2020   Flutter Devi 1 Units by Does not apply route as needed.   omeprazole 40 MG capsule Commonly known as: PRILOSEC Take 1 capsule (40 mg total) by mouth daily.   promethazine 25 MG suppository Commonly known as: Phenergan Place 1 suppository (25 mg total) rectally every 12 (twelve) hours as needed for nausea or vomiting.   Vitamin D 50 MCG (2000 UT) Caps Take 2,000 Units by mouth daily.       Follow-up Information    Colon Branch, MD. Schedule an appointment as soon as possible for a visit in 1 week(s).   Specialty: Internal Medicine Contact information: [address] [place] [address] [phone]              Allergies  Allergen Reactions  . Doxycycline Rash  . Other Other (See Comments)    Surgical glue : hives  . Oxycodone Nausea And Vomiting  . Sulfa Antibiotics Hives  . Trimethoprim Sulfate [Trimethoprim]     rash  . Codeine Nausea Only  . Propranolol Hcl Other (See Comments)    "Dizziness and heart racing"    Consultations:  None   Procedures/Studies: DG Chest Port 1 View  Result Date: 04/27/2020 CLINICAL DATA:  Cough EXAM: PORTABLE CHEST 1 VIEW COMPARISON:  05/30/2019 FINDINGS: There is hyperinflation of the lungs compatible with COPD. Cardiomegaly. No confluent opacities or effusions. No acute bony abnormality. IMPRESSION: Cardiomegaly, COPD.  No active disease. Electronically Signed   By: Rolm Baptise M.D.   On: 04/27/2020 17:49   CT Renal Stone Study  Result Date: 04/23/2020 CLINICAL DATA:  Worsening right flank pain over the last week with vomiting and shaking. History of urinary tract infections. Concern for calculus. EXAM: CT ABDOMEN AND PELVIS WITHOUT CONTRAST TECHNIQUE: Multidetector CT imaging of the abdomen and pelvis was performed following the standard protocol  without IV contrast. COMPARISON:  Contrast-enhanced abdominopelvic CT 09/15/2014. FINDINGS: Lower chest: Stable bronchiectasis in the right middle lobe and lingula. The lung bases are otherwise clear. No significant pleural or pericardial effusion. There is a small hiatal hernia and mild aortic atherosclerosis. Hepatobiliary: There is a stable lobulated cyst in the left hepatic lobe measuring up to 3.7 cm on image 24/2. Other scattered smaller low-density lesions are grossly stable. No worrisome hepatic findings on noncontrast imaging. No evidence of gallstones, gallbladder wall thickening or biliary dilatation. Pancreas: Unremarkable. No pancreatic ductal dilatation or surrounding inflammatory changes. Spleen: Normal in size. Previously demonstrated low-density lesions are not well seen on this noncontrast study. Adrenals/Urinary Tract: Both adrenal glands appear normal. No evidence of renal, ureteral or bladder calculus. There is mild asymmetric perinephric soft tissue stranding on the right without focal fluid collection. The right ureter is mildly dilated. The left kidney appears normal. The bladder is mildly distended without focal abnormality or surrounding inflammation. Stomach/Bowel: No evidence of bowel wall thickening, distention or surrounding inflammatory change. Small retrocecal appendix appears stable. There is moderate stool throughout the colon. Vascular/Lymphatic: There are no enlarged abdominal or pelvic lymph nodes. No significant vascular findings on  noncontrast imaging. Reproductive: Hysterectomy.  No adnexal mass. Other: No evidence of abdominal wall mass or hernia. No ascites. Musculoskeletal: No acute or significant osseous findings. Mild thoracolumbar scoliosis and spondylosis. Small sacral Tarlov cyst. IMPRESSION: 1. Mild asymmetric perinephric soft tissue stranding on the right with mild dilatation of the right ureter. No evidence of renal, ureteral or bladder calculus. Findings could be  secondary to a recently passed calculus or urinary tract infection. Correlation with urine analysis recommended. 2. Stable hepatic cysts. Electronically Signed   By: Richardean Sale M.D.   On: 04/23/2020 17:54       Subjective: Patient seen and examined at the bedside this morning.  Hemodynamically stable for discharge today.  Discharge Exam: Vitals:   04/27/20 2133 04/28/20 0512  BP: 131/76 125/77  Pulse: 78 78  Resp: 18 14  Temp: 97.9 F (36.6 C) 97.9 F (36.6 C)  SpO2: 98% 96%   Vitals:   04/27/20 0447 04/27/20 0910 04/27/20 2133 04/28/20 0512  BP: 113/68  131/76 125/77  Pulse: 93 (!) 104 78 78  Resp: 18  18 14   Temp: 98.6 F (37 C)  97.9 F (36.6 C) 97.9 F (36.6 C)  TempSrc:   Oral Oral  SpO2: 95% 100% 98% 96%  Weight:      Height:        General: Pt is alert, awake, not in acute distress Cardiovascular: RRR, S1/S2 +, no rubs, no gallops Respiratory: CTA bilaterally, no wheezing, no rhonchi Abdominal: Soft, NT, ND, bowel sounds + Extremities: no edema, no cyanosis    The results of significant diagnostics from this hospitalization (including imaging, microbiology, ancillary and laboratory) are listed below for reference.     Microbiology: Recent Results (from the past 240 hour(s))  Urine culture     Status: Abnormal   Collection Time: 04/23/20  2:40 PM   Specimen: Urine, Clean Catch  Result Value Ref Range Status   Specimen Description   Final    URINE, CLEAN CATCH Performed at [facility], [place] [address] [place], [place] [address]    Special Requests   Final    NONE Performed at [facility], [place] [address] [place], [place] [address]    Culture >=100,000 COLONIES/mL ESCHERICHIA COLI (A)  Final   Report Status 04/25/2020 FINAL  Final   Organism ID, Bacteria ESCHERICHIA COLI (A)  Final      Susceptibility   Escherichia coli - MIC*    AMPICILLIN >=32 RESISTANT Resistant     CEFAZOLIN <=4 SENSITIVE Sensitive      CEFTRIAXONE <=0.25 SENSITIVE Sensitive     CIPROFLOXACIN <=0.25 SENSITIVE Sensitive     GENTAMICIN <=1 SENSITIVE Sensitive     IMIPENEM <=0.25 SENSITIVE Sensitive     NITROFURANTOIN <=16 SENSITIVE Sensitive     TRIMETH/SULFA >=320 RESISTANT Resistant     AMPICILLIN/SULBACTAM >=32 RESISTANT Resistant     PIP/TAZO <=4 SENSITIVE Sensitive     * >=100,000 COLONIES/mL ESCHERICHIA COLI  Culture, blood (routine x 2)     Status: Abnormal   Collection Time: 04/23/20  3:04 PM   Specimen: BLOOD  Result Value Ref Range Status   Specimen Description   Final    BLOOD LEFT ANTECUBITAL Performed at [place] [address], [place],  [address]    Special Requests   Final    BOTTLES DRAWN AEROBIC AND ANAEROBIC Blood Culture results may not be optimal due to an inadequate volume of blood received in culture bottles  Performed at [facility], [place] [address], [place], [place] [address]    Culture  Setup Time   Final    GRAM NEGATIVE RODS IN BOTH AEROBIC AND ANAEROBIC BOTTLES CRITICAL RESULT CALLED TO, READ BACK BY AND VERIFIED WITH: PHRMD J LEGGE @0650  04/24/20 BY S GEZAHEGN Performed at [facility], [place] [address] [place], [place] [address]    Culture ESCHERICHIA COLI (A)  Final   Report Status 04/26/2020 FINAL  Final   Organism ID, Bacteria ESCHERICHIA COLI  Final      Susceptibility   Escherichia coli - MIC*    AMPICILLIN >=32 RESISTANT Resistant     CEFAZOLIN <=4 SENSITIVE Sensitive     CEFEPIME <=0.12 SENSITIVE Sensitive     CEFTAZIDIME <=1 SENSITIVE Sensitive     CEFTRIAXONE <=0.25 SENSITIVE Sensitive     CIPROFLOXACIN <=0.25 SENSITIVE Sensitive     GENTAMICIN <=1 SENSITIVE Sensitive     IMIPENEM <=0.25 SENSITIVE Sensitive     TRIMETH/SULFA >=320 RESISTANT Resistant     AMPICILLIN/SULBACTAM 16 INTERMEDIATE Intermediate     PIP/TAZO <=4 SENSITIVE Sensitive     * ESCHERICHIA COLI  Culture, blood (routine x 2)     Status: None    Collection Time: 04/23/20  3:04 PM   Specimen: BLOOD  Result Value Ref Range Status   Specimen Description   Final    BLOOD RIGHT ANTECUBITAL Performed at [place] [address], [place], [place] [address]    Special Requests   Final    BOTTLES DRAWN AEROBIC AND ANAEROBIC Blood Culture results may not be optimal due to an inadequate volume of blood received in culture bottles Performed at [place] [address], [place], [place] [address]    Culture   Final    NO GROWTH 5 DAYS Performed at [facility], [place] [address] [place], [place] [address]    Report Status 04/28/2020 FINAL  Final  Blood Culture ID Panel (Reflexed)     Status: Abnormal   Collection Time: 04/23/20  3:04 PM  Result Value Ref Range Status   Enterococcus species NOT DETECTED NOT DETECTED Final   Listeria monocytogenes NOT DETECTED NOT DETECTED Final   Staphylococcus species NOT DETECTED NOT DETECTED Final   Staphylococcus aureus (BCID) NOT DETECTED NOT DETECTED Final   Streptococcus species NOT DETECTED NOT DETECTED Final   Streptococcus agalactiae NOT DETECTED NOT DETECTED Final   Streptococcus pneumoniae NOT DETECTED NOT DETECTED Final   Streptococcus pyogenes NOT DETECTED NOT DETECTED Final   Acinetobacter baumannii NOT DETECTED NOT DETECTED Final   Enterobacteriaceae species DETECTED (A) NOT DETECTED Final    Comment: Enterobacteriaceae represent a large family of gram-negative bacteria, not a single organism. CRITICAL RESULT CALLED TO, READ BACK BY AND VERIFIED WITH: PHRMD J LEGGE @0650  04/24/20 BY S GEZAHEGN    Enterobacter cloacae complex NOT DETECTED NOT DETECTED Final   Escherichia coli DETECTED (A) NOT DETECTED Final    Comment: CRITICAL RESULT CALLED TO, READ BACK BY AND VERIFIED WITH: PHRMD J LEGGE @0650  04/24/20 BY S GEZAHEGN    Klebsiella oxytoca NOT DETECTED NOT DETECTED Final   Klebsiella pneumoniae NOT DETECTED NOT DETECTED Final   Proteus  species NOT DETECTED NOT DETECTED Final   Serratia marcescens NOT DETECTED NOT DETECTED Final   Carbapenem resistance NOT DETECTED NOT DETECTED Final   Haemophilus influenzae NOT DETECTED NOT DETECTED Final   Neisseria meningitidis NOT DETECTED NOT DETECTED Final   Pseudomonas aeruginosa  NOT DETECTED NOT DETECTED Final   Candida albicans NOT DETECTED NOT DETECTED Final   Candida glabrata NOT DETECTED NOT DETECTED Final   Candida krusei NOT DETECTED NOT DETECTED Final   Candida parapsilosis NOT DETECTED NOT DETECTED Final   Candida tropicalis NOT DETECTED NOT DETECTED Final    Comment: Performed at [facility], [place] [address], [place], [place] [address]  SARS Coronavirus 2 by RT PCR (hospital order, performed in [facility] hospital lab) Nasopharyngeal Nasopharyngeal Swab     Status: None   Collection Time: 04/23/20  4:24 PM   Specimen: Nasopharyngeal Swab  Result Value Ref Range Status   SARS Coronavirus 2 NEGATIVE NEGATIVE Final    Comment: (NOTE) SARS-CoV-2 target nucleic acids are NOT DETECTED.  The SARS-CoV-2 RNA is generally detectable in upper and lower respiratory specimens during the acute phase of infection. The lowest concentration of SARS-CoV-2 viral copies this assay can detect is 250 copies / mL. A negative result does not preclude SARS-CoV-2 infection and should not be used as the sole basis for treatment or other patient management decisions.  A negative result may occur with improper specimen collection / handling, submission of specimen other than nasopharyngeal swab, presence of viral mutation(s) within the areas targeted by this assay, and inadequate number of viral copies (<250 copies / mL). A negative result must be combined with clinical observations, patient history, and epidemiological information.  Fact Sheet for Patients:   StrictlyIdeas.no  Fact Sheet for Healthcare  Providers: BankingDealers.co.za  This test is not yet approved or  cleared by the [place] FDA and has been authorized for detection and/or diagnosis of SARS-CoV-2 by FDA under an Emergency Use Authorization (EUA).  This EUA will remain in effect (meaning this test can be used) for the duration of the COVID-19 declaration under Section 564(b)(1) of the Act, 21 U.S.C. section 360bbb-3(b)(1), unless the authorization is terminated or revoked sooner.  Performed at [facility], [place] [address] [place], [place] [address]   Culture, blood (routine x 2)     Status: None (Preliminary result)   Collection Time: 04/26/20  4:39 AM   Specimen: BLOOD  Result Value Ref Range Status   Specimen Description   Final    BLOOD RIGHT ARM Performed at [place] [address], [place], [place] [address]    Special Requests   Final    BOTTLES DRAWN AEROBIC AND ANAEROBIC Blood Culture adequate volume Performed at [place] [address], [place], [place] [address]    Culture   Final    NO GROWTH 2 DAYS Performed at [place] [address] [place],  [place] [address]    Report Status PENDING  Incomplete  Culture, blood (routine x 2)     Status: None (Preliminary result)   Collection Time: 04/26/20  4:45 AM   Specimen: BLOOD RIGHT HAND  Result Value Ref Range Status   Specimen Description   Final    BLOOD RIGHT HAND Performed at [place] [address], [place], [place] [address]    Special Requests   Final    BOTTLES DRAWN AEROBIC AND ANAEROBIC Blood Culture adequate volume Performed at [place] [address] [place], [place] [address]    Culture   Final    NO GROWTH 2 DAYS Performed at [place] [address] [place] [address], [place],  [address]    Report Status PENDING  Incomplete  Labs: BNP (last 3 results) No results for  input(s): BNP in the last 8760 hours. Basic Metabolic Panel: Recent Labs  Lab 04/24/20 0500 04/25/20 0434 04/26/20 0439 04/27/20 0351 04/28/20 0344  NA 140 135 134* 135 140  K 3.1* 4.0 3.8 3.4* 3.7  CL 114* 107 106 103 105  CO2 17* 22 18* 22 26  GLUCOSE 84 91 93 163* 98  BUN 9 10 9 12 13   CREATININE 0.70 0.96 0.80 1.04* 0.92  CALCIUM 6.8* 8.0* 8.1* 7.9* 8.7*   Liver Function Tests: No results for input(s): AST, ALT, ALKPHOS, BILITOT, PROT, ALBUMIN in the last 168 hours. No results for input(s): LIPASE, AMYLASE in the last 168 hours. No results for input(s): AMMONIA in the last 168 hours. CBC: Recent Labs  Lab 04/23/20 1504 04/23/20 2300 04/24/20 0500 04/25/20 0434 04/26/20 0439 04/27/20 0351 04/28/20 0344  WBC 8.8   < > 7.4 7.3 4.4 5.0 5.8  NEUTROABS 7.4  --   --  5.0 2.5 2.7 2.9  HGB 11.0*   < > 8.4* 9.9* 9.5* 9.8* 9.9*  HCT 32.1*   < > 25.4* 29.5* 27.4* 28.2* 28.5*  MCV 89.7   < > 92.4 90.2 88.7 88.4 86.4  PLT 206   < > 151 196 192 201 235   < > = values in this interval not displayed.   Cardiac Enzymes: No results for input(s): CKTOTAL, CKMB, CKMBINDEX, TROPONINI in the last 168 hours. BNP: Invalid input(s): POCBNP CBG: Recent Labs  Lab 04/23/20 1555  GLUCAP 106*   D-Dimer No results for input(s): DDIMER in the last 72 hours. Hgb A1c No results for input(s): HGBA1C in the last 72 hours. Lipid Profile No results for input(s): CHOL, HDL, LDLCALC, TRIG, CHOLHDL, LDLDIRECT in the last 72 hours. Thyroid function studies No results for input(s): TSH, T4TOTAL, T3FREE, THYROIDAB in the last 72 hours.  Invalid input(s): FREET3 Anemia work up No results for input(s): VITAMINB12, FOLATE, FERRITIN, TIBC, IRON, RETICCTPCT in the last 72 hours. Urinalysis    Component Value Date/Time   COLORURINE YELLOW 04/23/2020 1440   APPEARANCEUR CLOUDY (A) 04/23/2020 1440   LABSPEC 1.010 04/23/2020 1440   PHURINE 6.0 04/23/2020 1440   GLUCOSEU NEGATIVE 04/23/2020 1440    HGBUR MODERATE (A) 04/23/2020 1440   HGBUR moderate 10/14/2008 1615   BILIRUBINUR NEGATIVE 04/23/2020 1440   BILIRUBINUR Negative 09/04/2018 1643   KETONESUR NEGATIVE 04/23/2020 1440   PROTEINUR 100 (A) 04/23/2020 1440   UROBILINOGEN negative (A) 09/04/2018 1643   UROBILINOGEN 0.2 11/17/2013 1624   NITRITE NEGATIVE 04/23/2020 1440   LEUKOCYTESUR LARGE (A) 04/23/2020 1440   Sepsis Labs Invalid input(s): PROCALCITONIN,  WBC,  LACTICIDVEN Microbiology Recent Results (from the past 240 hour(s))  Urine culture     Status: Abnormal   Collection Time: 04/23/20  2:40 PM   Specimen: Urine, Clean Catch  Result Value Ref Range Status   Specimen Description   Final    URINE, CLEAN CATCH Performed at [facility], [place] [address], [place], [place] [address]    Special Requests   Final    NONE Performed at [facility], [place] [address] [place], [place] [address]    Culture >=100,000 COLONIES/mL ESCHERICHIA COLI (A)  Final   Report Status 04/25/2020 FINAL  Final   Organism ID, Bacteria ESCHERICHIA COLI (A)  Final      Susceptibility   Escherichia coli - MIC*    AMPICILLIN >=32 RESISTANT Resistant     CEFAZOLIN <=4  SENSITIVE Sensitive     CEFTRIAXONE <=0.25 SENSITIVE Sensitive     CIPROFLOXACIN <=0.25 SENSITIVE Sensitive     GENTAMICIN <=1 SENSITIVE Sensitive     IMIPENEM <=0.25 SENSITIVE Sensitive     NITROFURANTOIN <=16 SENSITIVE Sensitive     TRIMETH/SULFA >=320 RESISTANT Resistant     AMPICILLIN/SULBACTAM >=32 RESISTANT Resistant     PIP/TAZO <=4 SENSITIVE Sensitive     * >=100,000 COLONIES/mL ESCHERICHIA COLI  Culture, blood (routine x 2)     Status: Abnormal   Collection Time: 04/23/20  3:04 PM   Specimen: BLOOD  Result Value Ref Range Status   Specimen Description   Final    BLOOD LEFT ANTECUBITAL Performed at [facility] [address], [place], [place] [address]    Special Requests   Final    BOTTLES DRAWN AEROBIC  AND ANAEROBIC Blood Culture results may not be optimal due to an inadequate volume of blood received in culture bottles Performed at [place] [address], [place], [place] [address]    Culture  Setup Time   Final    GRAM NEGATIVE RODS IN BOTH AEROBIC AND ANAEROBIC BOTTLES CRITICAL RESULT CALLED TO, READ BACK BY AND VERIFIED WITH: PHRMD J LEGGE @0650  04/24/20 BY S GEZAHEGN Performed at [facility], [place] [address] [place], [place] [address]    Culture ESCHERICHIA COLI (A)  Final   Report Status 04/26/2020 FINAL  Final   Organism ID, Bacteria ESCHERICHIA COLI  Final      Susceptibility   Escherichia coli - MIC*    AMPICILLIN >=32 RESISTANT Resistant     CEFAZOLIN <=4 SENSITIVE Sensitive     CEFEPIME <=0.12 SENSITIVE Sensitive     CEFTAZIDIME <=1 SENSITIVE Sensitive     CEFTRIAXONE <=0.25 SENSITIVE Sensitive     CIPROFLOXACIN <=0.25 SENSITIVE Sensitive     GENTAMICIN <=1 SENSITIVE Sensitive     IMIPENEM <=0.25 SENSITIVE Sensitive     TRIMETH/SULFA >=320 RESISTANT Resistant     AMPICILLIN/SULBACTAM 16 INTERMEDIATE Intermediate     PIP/TAZO <=4 SENSITIVE Sensitive     * ESCHERICHIA COLI  Culture, blood (routine x 2)     Status: None   Collection Time: 04/23/20  3:04 PM   Specimen: BLOOD  Result Value Ref Range Status   Specimen Description   Final    BLOOD RIGHT ANTECUBITAL Performed at [place] [address], [place], [place] [address]    Special Requests   Final    BOTTLES DRAWN AEROBIC AND ANAEROBIC Blood Culture results may not be optimal due to an inadequate volume of blood received in culture bottles Performed at [place] [address] [place], [place] [address]    Culture   Final    NO GROWTH 5 DAYS Performed at [facility], [place] [address] [place], [place] [address]    Report Status 04/28/2020 FINAL  Final  Blood Culture ID Panel (Reflexed)     Status: Abnormal   Collection Time:  04/23/20  3:04 PM  Result Value Ref Range Status   Enterococcus species NOT DETECTED NOT DETECTED Final   Listeria monocytogenes NOT DETECTED NOT DETECTED Final   Staphylococcus species NOT DETECTED NOT DETECTED Final   Staphylococcus aureus (BCID) NOT DETECTED NOT DETECTED Final   Streptococcus species NOT DETECTED NOT DETECTED Final   Streptococcus agalactiae NOT DETECTED NOT DETECTED Final   Streptococcus pneumoniae NOT DETECTED NOT DETECTED Final   Streptococcus pyogenes NOT DETECTED  NOT DETECTED Final   Acinetobacter baumannii NOT DETECTED NOT DETECTED Final   Enterobacteriaceae species DETECTED (A) NOT DETECTED Final    Comment: Enterobacteriaceae represent a large family of gram-negative bacteria, not a single organism. CRITICAL RESULT CALLED TO, READ BACK BY AND VERIFIED WITH: PHRMD J LEGGE @0650  04/24/20 BY S GEZAHEGN    Enterobacter cloacae complex NOT DETECTED NOT DETECTED Final   Escherichia coli DETECTED (A) NOT DETECTED Final    Comment: CRITICAL RESULT CALLED TO, READ BACK BY AND VERIFIED WITH: PHRMD J LEGGE @0650  04/24/20 BY S GEZAHEGN    Klebsiella oxytoca NOT DETECTED NOT DETECTED Final   Klebsiella pneumoniae NOT DETECTED NOT DETECTED Final   Proteus species NOT DETECTED NOT DETECTED Final   Serratia marcescens NOT DETECTED NOT DETECTED Final   Carbapenem resistance NOT DETECTED NOT DETECTED Final   Haemophilus influenzae NOT DETECTED NOT DETECTED Final   Neisseria meningitidis NOT DETECTED NOT DETECTED Final   Pseudomonas aeruginosa NOT DETECTED NOT DETECTED Final   Candida albicans NOT DETECTED NOT DETECTED Final   Candida glabrata NOT DETECTED NOT DETECTED Final   Candida krusei NOT DETECTED NOT DETECTED Final   Candida parapsilosis NOT DETECTED NOT DETECTED Final   Candida tropicalis NOT DETECTED NOT DETECTED Final    Comment: Performed at [facility], [place] [address] [place], [place] [address]  SARS Coronavirus 2 by RT PCR (hospital order, performed in  [facility] hospital lab) Nasopharyngeal Nasopharyngeal Swab     Status: None   Collection Time: 04/23/20  4:24 PM   Specimen: Nasopharyngeal Swab  Result Value Ref Range Status   SARS Coronavirus 2 NEGATIVE NEGATIVE Final    Comment: (NOTE) SARS-CoV-2 target nucleic acids are NOT DETECTED.  The SARS-CoV-2 RNA is generally detectable in upper and lower respiratory specimens during the acute phase of infection. The lowest concentration of SARS-CoV-2 viral copies this assay can detect is 250 copies / mL. A negative result does not preclude SARS-CoV-2 infection and should not be used as the sole basis for treatment or other patient management decisions.  A negative result may occur with improper specimen collection / handling, submission of specimen other than nasopharyngeal swab, presence of viral mutation(s) within the areas targeted by this assay, and inadequate number of viral copies (<250 copies / mL). A negative result must be combined with clinical observations, patient history, and epidemiological information.  Fact Sheet for Patients:   StrictlyIdeas.no  Fact Sheet for Healthcare Providers: BankingDealers.co.za  This test is not yet approved or  cleared by the [place] FDA and has been authorized for detection and/or diagnosis of SARS-CoV-2 by FDA under an Emergency Use Authorization (EUA).  This EUA will remain in effect (meaning this test can be used) for the duration of the COVID-19 declaration under Section 564(b)(1) of the Act, 21 U.S.C. section 360bbb-3(b)(1), unless the authorization is terminated or revoked sooner.  Performed at [facility], [place] [address] [place], [place] [address]   Culture, blood (routine x 2)     Status: None (Preliminary result)   Collection Time: 04/26/20  4:39 AM   Specimen: BLOOD  Result Value Ref Range Status   Specimen Description   Final    BLOOD RIGHT  ARM Performed at [facility] [address] [place], [place] [address]    Special Requests   Final    BOTTLES DRAWN AEROBIC AND ANAEROBIC Blood Culture adequate volume Performed at [facility] [address] [place],  [place] [address]    Culture   Final    NO GROWTH 2 DAYS Performed at [facility], [place] [address], [place], [place] [address]    Report Status PENDING  Incomplete  Culture, blood (routine x 2)     Status: None (Preliminary result)   Collection Time: 04/26/20  4:45 AM   Specimen: BLOOD RIGHT HAND  Result Value Ref Range Status   Specimen Description   Final    BLOOD RIGHT HAND Performed at [facility] [address], [place], [place] [address]    Special Requests   Final    BOTTLES DRAWN AEROBIC AND ANAEROBIC Blood Culture adequate volume Performed at [facility] [address], [place], [place] [address]    Culture   Final    NO GROWTH 2 DAYS Performed at [facility] [address], [place], [place] [address]    Report Status PENDING  Incomplete    Please note: You were cared for by a hospitalist during your hospital stay. Once you are discharged, your primary care physician will handle any further medical issues. Please note that NO REFILLS for any discharge medications will be authorized once you are discharged, as it is imperative that you return to your primary care physician (or establish a relationship with a primary care physician if you do not have one) for your post hospital discharge needs so that they can reassess your need for medications and monitor your lab values.    Time coordinating discharge: 40 minutes  SIGNED:   Shelly Coss, MD  [facility] 04/28/2020, 8:39 AM Pager [phone]  If 7PM-7AM, please contact night-coverage [email or web address] Password [email or web address]

## 2020-04-29 ENCOUNTER — Telehealth: Payer: Self-pay | Admitting: *Deleted

## 2020-04-29 NOTE — Telephone Encounter (Signed)
1st attempt. Unable to reach patient.   

## 2020-04-30 NOTE — Telephone Encounter (Signed)
I have made two attempts and have been unable to reach patient. Pt has hospital follow up scheduled w/ PCP 05/10/20.

## 2020-05-01 LAB — CULTURE, BLOOD (ROUTINE X 2)
Culture: NO GROWTH
Culture: NO GROWTH
Special Requests: ADEQUATE
Special Requests: ADEQUATE

## 2020-05-10 ENCOUNTER — Encounter: Payer: Self-pay | Admitting: Internal Medicine

## 2020-05-10 ENCOUNTER — Other Ambulatory Visit: Payer: Self-pay

## 2020-05-10 ENCOUNTER — Ambulatory Visit: Payer: Medicare PPO | Admitting: Internal Medicine

## 2020-05-10 VITALS — BP 113/75 | HR 66 | Temp 98.1°F | Resp 18 | Ht 63.0 in | Wt 139.1 lb

## 2020-05-10 DIAGNOSIS — N390 Urinary tract infection, site not specified: Secondary | ICD-10-CM | POA: Diagnosis not present

## 2020-05-10 DIAGNOSIS — D649 Anemia, unspecified: Secondary | ICD-10-CM | POA: Diagnosis not present

## 2020-05-10 DIAGNOSIS — A419 Sepsis, unspecified organism: Secondary | ICD-10-CM

## 2020-05-10 DIAGNOSIS — R319 Hematuria, unspecified: Secondary | ICD-10-CM

## 2020-05-10 DIAGNOSIS — N10 Acute tubulo-interstitial nephritis: Secondary | ICD-10-CM | POA: Diagnosis not present

## 2020-05-10 LAB — POC URINALSYSI DIPSTICK (AUTOMATED)
Bilirubin, UA: NEGATIVE
Glucose, UA: NEGATIVE
Ketones, UA: NEGATIVE
Leukocytes, UA: NEGATIVE
Nitrite, UA: NEGATIVE
Protein, UA: NEGATIVE
Spec Grav, UA: 1.015 (ref 1.010–1.025)
Urobilinogen, UA: 0.2 E.U./dL
pH, UA: 6 (ref 5.0–8.0)

## 2020-05-10 NOTE — Patient Instructions (Signed)
Stop taking iron  Call anytime if you have fever, chills, nausea, blood in the stools or if you are not feeling well    GO TO THE FRONT DESK, PLEASE SCHEDULE YOUR APPOINTMENTS Come back for blood work and a urine sample in 10 days to 2 weeks  Come back for a checkup in 3 months

## 2020-05-10 NOTE — Progress Notes (Signed)
Pre visit review using our clinic review tool, if applicable. No additional management support is needed unless otherwise documented below in the visit note. 

## 2020-05-10 NOTE — Progress Notes (Signed)
Subjective:    Patient ID: Cynthia Thornton, female    DOB: 1945-12-07, 74 y.o.   MRN: [id number]  DOS:  05/10/2020 Type of visit - description: Hospital follow-up, TCM 14  Admitted to the hospital and discharged 04/28/2020. ~ 4 weeks prior to the hospital admission went to a UC,had LUTS (freq, burning)  rx abx for presumed UTI, UCX reportedly neg, did not get better , saw her urologist, was found to be acutely ill with nausea, vomiting, fever, referred to the ER. At the ER, she was found to be hypotensive, CT stone showed perinephritic soft tissue stranding and a mild dilatation of the right ureter. UA + for infection.  Was admitted to the hospital, blood culture showed pansensitive E. coli, was treated with antibiotics, IV and subsequently oral.  Review of Systems Since she left the hospital, she finished the antibiotics as prescribed and is feeling well. No fever chills Appetite is okay She has some R>L flank pain prior to admission but now is asymptomatic. Denies abdominal pain, nausea, vomiting, change in the color of the stools. Prior to admission have some dysuria and urinary frequency but no LUTS at this point  Past Medical History:  Diagnosis Date  . Arthritis   . Breast cancer, left [facility]) oncologist-- dr [name]   dx 09-26-2017--- IDC, Stage IA, Grade 2, ER/PR +;  HER2 negative;  s/p  breast lumpectomy w/ node dissection 11-02-2017;  completed radiation 01-09-2018 (left breast genetic screening panel 2017 with variant of unknown significance AXINA)  . COPD (chronic obstructive pulmonary disease) ([place])    pulmologist-- dr Dillard Essex-- w/ chronic lingular scarring  (last exceratbation bronchiectasis 07/ 2020)  . Dyslipidemia    followed by cardiology/ vascular-- dr g. adams ([facility] in [place])  family history strokes  . Eczema   . Family history of breast cancer   . Family history of colon cancer   . GERD (gastroesophageal reflux disease)   . Hiatal hernia   .  History of esophageal stricture    s/p dilatation 2008  . History of external beam radiation therapy    left breast 12-19-2017  to 01-09-2018  . History of hyperthyroidism    endocrinologist-- (lov 04-11-2019 epic) dr Loanne Drilling, dx 2009 due to multinodular goiter ,  had taken medication for few months 2010 stopped due to norma TFT;    . Insomnia   . Multinodular goiter last thyroid ultrasound in epic 12-18-2018   endocrinologist-- dr Loanne Drilling,  dx 2009 w/ hyperthroidism with medication until normal TFT in 2010;    . Osteopenia 02/2018   T score -2.4 distal third of radius.  FRAX 11% / 1.8% stable at other points of interest to include spine, right and left hip  . Personal history of colonic polyps 09/06/2001   hyperplastic  . Pityriasis rosea   . PONV (postoperative nausea and vomiting)    severe  . Primary osteoarthritis of left knee    Severe Patellofemoral arthritis  . 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 Wears contact lenses     Past Surgical History:  Procedure Laterality Date  . BREAST LUMPECTOMY WITH RADIOACTIVE SEED AND SENTINEL LYMPH NODE BIOPSY Left 11/02/2017   Procedure: LEFT BREAST LUMPECTOMY WITH RADIOACTIVE SEED AND LEFT AXILLARY DEEP SENTINEL LYMPH NODE BIOPSY, INJECT BLUE DYE LEFT BREAST;  Surgeon: [name], MD;  Location: [place];  Service: General;  Laterality: Left;  . BREAST SURGERY  2000   Breast cyst removed, left  . LAPAROSCOPIC BILATERAL SALPINGO OOPHERECTOMY Bilateral 10/13/2019   Procedure: LAPAROSCOPIC BILATERAL SALPINGO OOPHORECTOMY LYSIS OF ADHESIONS PERITONEAL WASHINGS ;  Surgeon: [name], MD;  Location: [place] [facility];  Service: Gynecology;  Laterality: Bilateral;  request 7:30am OR time in [place] [facility] block requests one hour  . TONSILLECTOMY  age 64  . TUBAL LIGATION Bilateral yrs ago  . VAGINAL HYSTERECTOMY  1991   Right ovarian cystectomy  . VIDEO  BRONCHOSCOPY Bilateral 03/16/2015   Procedure: VIDEO BRONCHOSCOPY WITHOUT FLUORO;  Surgeon: Brand Males, MD;  Location: [facility] ENDOSCOPY;  Service: Endoscopy;  Laterality: Bilateral;    Allergies as of 05/10/2020      Reactions   Doxycycline Rash   Other Other (See Comments)   Surgical glue : hives   Oxycodone Nausea And Vomiting   Sulfa Antibiotics Hives   Trimethoprim Sulfate [trimethoprim]    rash   Codeine Nausea Only   Propranolol Hcl Other (See Comments)   "Dizziness and heart racing"      Medication List       Accurate as of May 10, 2020 11:59 PM. If you have any questions, ask your nurse or doctor.        STOP taking these medications   ferrous sulfate 325 (65 FE) MG tablet Stopped by: Kathlene November, MD   [place] by: Kathlene November, MD     TAKE these medications   aspirin EC 81 MG tablet Take 81 mg by mouth daily.   B-complex with vitamin C tablet Take 1 tablet by mouth daily.   cetirizine 10 MG tablet Commonly known as: ZYRTEC Take 10 mg by mouth daily.   clorazepate 7.5 MG tablet Commonly known as: TRANXENE TAKE 1 TABLET (7.5 MG TOTAL) BY MOUTH AT BEDTIME AS NEEDED FOR ANXIETY.   famotidine 20 MG tablet Commonly known as: PEPCID Take 1 tablet (20 mg total) by mouth 2 (two) times daily. What changed: when to take this   omeprazole 40 MG capsule Commonly known as: PRILOSEC Take 1 capsule (40 mg total) by mouth daily.   promethazine 25 MG suppository Commonly known as: Phenergan Place 1 suppository (25 mg total) rectally every 12 (twelve) hours as needed for nausea or vomiting.   Vitamin D 50 MCG (2000 UT) Caps Take 2,000 Units by mouth daily.          Objective:   Physical Exam BP 113/75 (BP Location: Right Arm, Patient Position: Sitting, Cuff Size: Small)   Pulse 66   Temp 98.1 F (36.7 C) (Oral)   Resp 18   Ht 5' 3"  (1.6 m)   Wt 139 lb 2 oz (63.1 kg)   SpO2 98%   BMI 24.64 kg/m  General:   Well developed, NAD, BMI noted.    HEENT:  Normocephalic . Face symmetric, atraumatic Lungs:  CTA B Normal respiratory effort, no intercostal retractions, no accessory muscle use. Heart: RRR,  no murmur.  Abdomen:  Not distended, soft, non-tender. No rebound or rigidity.  No CVA tenderness Skin: Not pale. Not jaundice Lower extremities: no pretibial edema bilaterally  Neurologic:  alert & oriented X3.  Speech normal, gait appropriate for age and unassisted Psych--  Cognition and judgment appear intact.  Cooperative with normal attention span and concentration.  Behavior appropriate. No anxious or depressed appearing.     Assessment     Assessment  Bronchiectasis Dr Chase Caller Anxiety - Insomnia : tranxene   At hs, rx by gyn,  previously, I'll Rx if needed  Endocrinology Dr. Loanne Drilling q 3 years, Last visit 03/2015 --H/o thyrotoxicosis on remission --Goiter   Osteopenia --T score -1.5  (2015), dexa 02-2016 , dexa 03/19/2018 (at gyn) DJD -- [facility]: GERD, HH and esophageal stricture EGD 2007, colon polyps Breast cancer, DX 08-2017, s/p lumpectomy,  XRT, B salpingo-oophorectomy History of many years: Symptoms recur 77s and 27s. H/o DUB  PLAN TCM 14 Urosepsis, possibly right kidney stone: Patient was admitted to the hospital septic, source felt to be date urinary tract, UCX and Blood Cx  + E. Coli ; CT showed she possibly passed a right-sided renal stone.  See CT report. She is currently doing well, with no fever chills, no abdominal pain or flank pain.  U dip showed trace blood. Plan: CMP, CBC, iron, ferritin, UA, urine culture in 10 days to 2 weeks. Definitely call if symptoms resurface Follow-up with urology?  Last CT showed no stone, will refer to urology if needed. Anemia: Found to have anemia at the hospital, GI ROS negative, up-to-date on colonoscopies.  Related to acute illness?  We will check labs, see above. RTC labs 10 days RTC routine checkup 3 months  This visit occurred during the  SARS-CoV-2 public health emergency.  Safety protocols were in place, including screening questions prior to the visit, additional usage of staff PPE, and extensive cleaning of exam room while observing appropriate contact time as indicated for disinfecting solutions.

## 2020-05-11 ENCOUNTER — Encounter: Payer: Self-pay | Admitting: Internal Medicine

## 2020-05-11 NOTE — Assessment & Plan Note (Signed)
TCM 14 Urosepsis, possibly right kidney stone: Patient was admitted to the hospital septic, source felt to be date urinary tract, UCX and Blood Cx  + E. Coli ; CT showed she possibly passed a right-sided renal stone.  See CT report. She is currently doing well, with no fever chills, no abdominal pain or flank pain.  U dip showed trace blood. Plan: CMP, CBC, iron, ferritin, UA, urine culture in 10 days to 2 weeks. Definitely call if symptoms resurface Follow-up with urology?  Last CT showed no stone, will refer to urology if needed. Anemia: Found to have anemia at the hospital, GI ROS negative, up-to-date on colonoscopies.  Related to acute illness?  We will check labs, see above. RTC labs 10 days RTC routine checkup 3 months

## 2020-05-18 ENCOUNTER — Telehealth: Payer: Self-pay

## 2020-05-18 ENCOUNTER — Emergency Department (HOSPITAL_COMMUNITY)
Admission: EM | Admit: 2020-05-18 | Discharge: 2020-05-18 | Disposition: A | Discharge status: Home / Self Care | Payer: Medicare PPO | Attending: Emergency Medicine | Admitting: Emergency Medicine

## 2020-05-18 ENCOUNTER — Encounter (HOSPITAL_COMMUNITY): Payer: Self-pay | Admitting: Emergency Medicine

## 2020-05-18 DIAGNOSIS — R1110 Vomiting, unspecified: Secondary | ICD-10-CM | POA: Diagnosis not present

## 2020-05-18 DIAGNOSIS — Z5321 Procedure and treatment not carried out due to patient leaving prior to being seen by health care provider: Secondary | ICD-10-CM | POA: Diagnosis not present

## 2020-05-18 LAB — COMPREHENSIVE METABOLIC PANEL
ALT: 28 U/L (ref 0–44)
AST: 29 U/L (ref 15–41)
Albumin: 4.6 g/dL (ref 3.5–5.0)
Alkaline Phosphatase: 106 U/L (ref 38–126)
Anion gap: 15 (ref 5–15)
BUN: 14 mg/dL (ref 8–23)
CO2: 23 mmol/L (ref 22–32)
Calcium: 9.7 mg/dL (ref 8.9–10.3)
Chloride: 100 mmol/L (ref 98–111)
Creatinine, Ser: 0.78 mg/dL (ref 0.44–1.00)
GFR calc Af Amer: >60 mL/min (ref >60)
GFR calc non Af Amer: >60 mL/min (ref >60)
Glucose, Bld: 133 mg/dL — ABNORMAL HIGH (ref 70–99)
Potassium: 4.2 mmol/L (ref 3.5–5.1)
Sodium: 138 mmol/L (ref 135–145)
Total Bilirubin: 0.6 mg/dL (ref 0.3–1.2)
Total Protein: 7.2 g/dL (ref 6.5–8.1)

## 2020-05-18 LAB — CBC
HCT: 36.6 % (ref 36.0–46.0)
Hemoglobin: 12.7 g/dL (ref 12.0–15.0)
MCH: 30.7 pg (ref 26.0–34.0)
MCHC: 34.7 g/dL (ref 30.0–36.0)
MCV: 88.4 fL (ref 80.0–100.0)
Platelets: 273 10*3/uL (ref 150–400)
RBC: 4.14 MIL/uL (ref 3.87–5.11)
RDW: 13.7 % (ref 11.5–15.5)
WBC: 7 10*3/uL (ref 4.0–10.5)
nRBC: 0 % (ref 0.0–0.2)

## 2020-05-18 LAB — LIPASE, BLOOD: Lipase: 22 U/L (ref 11–51)

## 2020-05-18 MED ORDER — ONDANSETRON 4 MG PO TBDP: 4.0000 mg | ORAL_TABLET | Freq: Once | ORAL | Status: DC | PRN

## 2020-05-18 NOTE — Telephone Encounter (Signed)
Nurse Assessment Nurse: Sherrell Puller, RN, Amy Date/Time Cynthia Thornton Time): 05/18/2020 8:37:30 AM Confirm and document reason for call. If symptomatic, describe symptoms. ---Caller states she was hospitalized with sepsis 3 weeks ago. Urine was cloudy with trace of blood at follow up appointment last week. Says she's not having any urinary symptoms. When she woke up this morning she didn't feel sick but when she got up out of the bed she vomited several times. Next appointment is 05/24/20. No pain this morning. No fever. Patient had episode of vomiting while speaking to the nurse, says she has vomited 4 times this morning. Has the patient had close contact with a person known or suspected to have the novel coronavirus illness OR traveled / lives in area with major community spread (including international travel) in the last 14 days from the onset of symptoms? * If Asymptomatic, screen for exposure and travel within the last 14 days. ---No Does the patient have any new or worsening symptoms? ---Yes Will a triage be completed? ---Yes Related visit to physician within the last 2 weeks? ---Yes Does the PT have any chronic conditions? (i.e. diabetes, asthma, this includes High risk factors for pregnancy, etc.) ---No Is this a behavioral health or substance abuse call? ---NoPLEASE NOTE: All timestamps contained within this report are represented as [place] Standard Time. CONFIDENTIALTY NOTICE: This fax transmission is intended only for the addressee. It contains information that is legally privileged, confidential or otherwise protected from use or disclosure. If you are not the intended recipient, you are strictly prohibited from reviewing, disclosing, copying using or disseminating any of this information or taking any action in reliance on or regarding this information. If you have received this fax in error, please notify us immediately by telephone so that we can arrange for its return to [place]. Phone:  [phone], Toll-Free: [phone], Fax: [phone] Page: 2 of 2 Call Id: [id number] Guidelines Guideline Title Affirmed Question Affirmed Notes Nurse Date/Time Cynthia Thornton Time) Vomiting [1] MODERATE vomiting (e.g., 3 - 5 times/day) AND [2] age > 79 years Sherrell Puller, [place], Amy 05/18/2020 8:46:56 AM Disp. Time Cynthia Thornton Time) Disposition Final User 05/18/2020 8:53:54 AM Go to ED Now (or PCP triage) Yes Sherrell Puller, RN, Amy Caller Disagree/Comply Comply Caller Understands Yes PreDisposition Did not know what to do Care Advice Given Per Guideline GO TO ED NOW (OR PCP TRIAGE): * IF NO PCP (PRIMARY CARE PROVIDER) SECOND-LEVEL TRIAGE: You need to be seen within the next hour. Go to the [facility] at _____________ [place] as soon as you can. BRING A BUCKET IN CASE OF VOMITING: * You may wish to bring a bucket, pan, or sack with you in case there is more vomiting during the drive. BRING MEDICINES: * Please bring a list of your current medicines when you go to see the doctor. CARE ADVICE per Vomiting (Adult) guideline. Referrals GO TO FACILITY OTHER - SPECIFY

## 2020-05-18 NOTE — ED Triage Notes (Signed)
Per pt, states she started vomiting/wretching since 5 am-states she called PCP and they told her to come to ED-was in hospital 3 weeks ago for sepsis

## 2020-05-19 ENCOUNTER — Other Ambulatory Visit (INDEPENDENT_AMBULATORY_CARE_PROVIDER_SITE_OTHER): Payer: Medicare PPO

## 2020-05-19 ENCOUNTER — Other Ambulatory Visit: Payer: Self-pay

## 2020-05-19 DIAGNOSIS — A419 Sepsis, unspecified organism: Secondary | ICD-10-CM

## 2020-05-19 DIAGNOSIS — D649 Anemia, unspecified: Secondary | ICD-10-CM | POA: Diagnosis not present

## 2020-05-19 DIAGNOSIS — R319 Hematuria, unspecified: Secondary | ICD-10-CM

## 2020-05-19 DIAGNOSIS — N390 Urinary tract infection, site not specified: Secondary | ICD-10-CM

## 2020-05-20 LAB — CBC WITH DIFFERENTIAL/PLATELET
Basophils Absolute: 0 10*3/uL (ref 0.0–0.1)
Basophils Relative: 0.5 % (ref 0.0–3.0)
Eosinophils Absolute: 0.1 10*3/uL (ref 0.0–0.7)
Eosinophils Relative: 1.9 % (ref 0.0–5.0)
HCT: 31.4 % — ABNORMAL LOW (ref 36.0–46.0)
Hemoglobin: 10.9 g/dL — ABNORMAL LOW (ref 12.0–15.0)
Lymphocytes Relative: 35.9 % (ref 12.0–46.0)
Lymphs Abs: 1.8 10*3/uL (ref 0.7–4.0)
MCHC: 34.8 g/dL (ref 30.0–36.0)
MCV: 89 fl (ref 78.0–100.0)
Monocytes Absolute: 0.5 10*3/uL (ref 0.1–1.0)
Monocytes Relative: 10.4 % (ref 3.0–12.0)
Neutro Abs: 2.5 10*3/uL (ref 1.4–7.7)
Neutrophils Relative %: 51.3 % (ref 43.0–77.0)
Platelets: 236 10*3/uL (ref 150.0–400.0)
RBC: 3.53 Mil/uL — ABNORMAL LOW (ref 3.87–5.11)
RDW: 14.6 % (ref 11.5–15.5)
WBC: 5 10*3/uL (ref 4.0–10.5)

## 2020-05-20 LAB — URINALYSIS, ROUTINE W REFLEX MICROSCOPIC
Bilirubin Urine: NEGATIVE
Ketones, ur: NEGATIVE
Nitrite: NEGATIVE
Specific Gravity, Urine: <=1.005 — AB (ref 1.000–1.030)
Total Protein, Urine: NEGATIVE
Urine Glucose: NEGATIVE
Urobilinogen, UA: 0.2 (ref 0.0–1.0)
pH: 6 (ref 5.0–8.0)

## 2020-05-20 LAB — COMPREHENSIVE METABOLIC PANEL
ALT: 23 U/L (ref 0–35)
AST: 23 U/L (ref 0–37)
Albumin: 4.4 g/dL (ref 3.5–5.2)
Alkaline Phosphatase: 98 U/L (ref 39–117)
BUN: 14 mg/dL (ref 6–23)
CO2: 26 mEq/L (ref 19–32)
Calcium: 9.4 mg/dL (ref 8.4–10.5)
Chloride: 102 mEq/L (ref 96–112)
Creatinine, Ser: 1.03 mg/dL (ref 0.40–1.20)
GFR: 52.65 mL/min — ABNORMAL LOW (ref >60.00)
Glucose, Bld: 94 mg/dL (ref 70–99)
Potassium: 4.2 mEq/L (ref 3.5–5.1)
Sodium: 136 mEq/L (ref 135–145)
Total Bilirubin: 0.4 mg/dL (ref 0.2–1.2)
Total Protein: 6.8 g/dL (ref 6.0–8.3)

## 2020-05-21 ENCOUNTER — Encounter: Payer: Self-pay | Admitting: Internal Medicine

## 2020-05-21 LAB — URINE CULTURE
MICRO NUMBER:: 10732277
SPECIMEN QUALITY:: ADEQUATE

## 2020-05-21 LAB — IRON,TIBC AND FERRITIN PANEL
Ferritin: 37 ng/mL (ref 16–288)
Iron: 56 ug/dL (ref 45–160)

## 2020-05-21 MED ORDER — CIPROFLOXACIN HCL 500 MG PO TABS
500.0000 mg | ORAL_TABLET | Freq: Two times a day (BID) | ORAL | 0 refills | Status: DC
Start: 2020-05-21 — End: 2020-08-06

## 2020-05-21 NOTE — Addendum Note (Signed)
Addended byDamita Dunnings D on: 05/21/2020 03:06 PM   Modules accepted: Orders

## 2020-05-24 ENCOUNTER — Other Ambulatory Visit: Payer: Medicare PPO

## 2020-05-25 ENCOUNTER — Other Ambulatory Visit: Payer: Self-pay | Admitting: Obstetrics & Gynecology

## 2020-05-30 ENCOUNTER — Other Ambulatory Visit: Payer: Self-pay | Admitting: Internal Medicine

## 2020-06-09 ENCOUNTER — Ambulatory Visit: Payer: Medicare PPO | Admitting: Obstetrics & Gynecology

## 2020-06-09 ENCOUNTER — Encounter: Payer: Self-pay | Admitting: Obstetrics & Gynecology

## 2020-06-09 ENCOUNTER — Other Ambulatory Visit: Payer: Self-pay

## 2020-06-09 VITALS — BP 130/86

## 2020-06-09 DIAGNOSIS — M810 Age-related osteoporosis without current pathological fracture: Secondary | ICD-10-CM | POA: Diagnosis not present

## 2020-06-09 NOTE — Progress Notes (Signed)
    Cynthia Thornton 05/05/1946 [id number]        74 y.o.  [id number]   RP: Osteoporosis on Bone Density on 05/25/2020  HPI: No history of bone fracture.  Good balance in general.  Although patient fell yesterday and did not break any bone.  Treated for arthritis.  History of breast cancer.   OB History  Gravida Para Term Preterm AB Living  3 2 2   1 2   SAB TAB Ectopic Multiple Live Births               # Outcome Date GA Lbr Len/2nd Weight Sex Delivery Anes PTL Lv  3 AB           2 Term           1 Term             Past medical history,surgical history, problem list, medications, allergies, family history and social history were all reviewed and documented in the EPIC chart.   Directed ROS with pertinent positives and negatives documented in the history of present illness/assessment and plan.  Exam:  Vitals:   06/09/20 1608  BP: 130/86   General appearance:  Normal  Bone Density 05/25/2020: Total left forearm T-Score -3.0   Assessment/Plan:  74 y.o. [id number]   1. Age-related osteoporosis without current pathological fracture Counseling done on osteoporosis and the risks of fracture.  Osteoporosis with a T score of -3.0 at the total left forearm.  Only osteopenia of the hips and normal at the lumbar spine.  Will check vitamin D level today.  Will recommend supplementation according to the results.  Calcium intake of 1500 mg daily recommended.  Regular weightbearing physical activities recommended.  Given that the osteoporosis was at the level of the total left forearm, patient prefers not to start on bone therapy.  Will do light weightlifting including the upper body.  We will repeat a bone density at 1 to 2 years. - VITAMIN D 25 Hydroxy (Vit-D Deficiency, Fractures)  [name] MD, 4:24 PM 06/09/2020

## 2020-06-10 LAB — VITAMIN D 25 HYDROXY (VIT D DEFICIENCY, FRACTURES): Vit D, 25-Hydroxy: 34 ng/mL (ref 30–100)

## 2020-06-12 ENCOUNTER — Encounter: Payer: Self-pay | Admitting: Obstetrics & Gynecology

## 2020-06-15 DIAGNOSIS — L309 Dermatitis, unspecified: Secondary | ICD-10-CM | POA: Diagnosis not present

## 2020-06-15 DIAGNOSIS — L720 Epidermal cyst: Secondary | ICD-10-CM | POA: Diagnosis not present

## 2020-06-15 DIAGNOSIS — L308 Other specified dermatitis: Secondary | ICD-10-CM | POA: Diagnosis not present

## 2020-06-17 ENCOUNTER — Other Ambulatory Visit: Payer: Self-pay

## 2020-06-17 ENCOUNTER — Other Ambulatory Visit (INDEPENDENT_AMBULATORY_CARE_PROVIDER_SITE_OTHER): Payer: Medicare PPO

## 2020-06-17 DIAGNOSIS — N390 Urinary tract infection, site not specified: Secondary | ICD-10-CM | POA: Diagnosis not present

## 2020-06-17 DIAGNOSIS — R319 Hematuria, unspecified: Secondary | ICD-10-CM | POA: Diagnosis not present

## 2020-06-18 LAB — URINALYSIS, ROUTINE W REFLEX MICROSCOPIC
Bilirubin Urine: NEGATIVE
Ketones, ur: NEGATIVE
Leukocytes,Ua: NEGATIVE
Nitrite: NEGATIVE
Specific Gravity, Urine: <=1.005 — AB (ref 1.000–1.030)
Total Protein, Urine: NEGATIVE
Urine Glucose: NEGATIVE
Urobilinogen, UA: 0.2 (ref 0.0–1.0)
pH: 5.5 (ref 5.0–8.0)

## 2020-06-19 LAB — URINE CULTURE
MICRO NUMBER:: 10847681
Result:: NO GROWTH
SPECIMEN QUALITY:: ADEQUATE

## 2020-06-22 ENCOUNTER — Other Ambulatory Visit: Payer: Medicare PPO

## 2020-06-24 ENCOUNTER — Encounter: Payer: Self-pay | Admitting: Internal Medicine

## 2020-06-27 ENCOUNTER — Other Ambulatory Visit: Payer: Self-pay | Admitting: Internal Medicine

## 2020-06-27 MED ORDER — CLORAZEPATE DIPOTASSIUM 7.5 MG PO TABS: 7.5000 mg | ORAL_TABLET | Freq: Every evening | ORAL | 2 refills | Status: DC | PRN

## 2020-07-03 ENCOUNTER — Other Ambulatory Visit: Payer: Self-pay | Admitting: Internal Medicine

## 2020-07-20 ENCOUNTER — Other Ambulatory Visit: Payer: Self-pay | Admitting: Internal Medicine

## 2020-07-20 ENCOUNTER — Telehealth: Payer: Self-pay | Admitting: Internal Medicine

## 2020-07-21 ENCOUNTER — Other Ambulatory Visit: Payer: Self-pay | Admitting: Internal Medicine

## 2020-07-21 ENCOUNTER — Telehealth: Payer: Self-pay | Admitting: Internal Medicine

## 2020-07-21 NOTE — Telephone Encounter (Signed)
See additional phone note. 

## 2020-07-21 NOTE — Telephone Encounter (Signed)
What medication does she need refilled?

## 2020-07-27 ENCOUNTER — Other Ambulatory Visit: Payer: Self-pay | Admitting: Internal Medicine

## 2020-07-27 ENCOUNTER — Encounter: Payer: Self-pay | Admitting: Internal Medicine

## 2020-07-27 MED ORDER — FAMOTIDINE 20 MG PO TABS: 20.0000 mg | ORAL_TABLET | Freq: Every day | ORAL | 0 refills | Status: DC

## 2020-07-27 MED ORDER — OMEPRAZOLE 40 MG PO CPDR: 40.0000 mg | DELAYED_RELEASE_CAPSULE | Freq: Every day | ORAL | 0 refills | Status: DC

## 2020-07-27 NOTE — Telephone Encounter (Signed)
Rx's sent until upcoming visit on 08/27/20.

## 2020-07-27 NOTE — Telephone Encounter (Signed)
Rx sent until upcoming visit 08/27/20

## 2020-08-04 DIAGNOSIS — Z20828 Contact with and (suspected) exposure to other viral communicable diseases: Secondary | ICD-10-CM | POA: Diagnosis not present

## 2020-08-05 DIAGNOSIS — N3000 Acute cystitis without hematuria: Secondary | ICD-10-CM | POA: Diagnosis not present

## 2020-08-05 NOTE — Progress Notes (Signed)
Patient Care Team: Colon Branch, MD as PCP - Gaston Islam, MD as Consulting Physician (Orthopedic Surgery) Nicholas Lose, MD as Consulting Physician (Hematology and Oncology) [name], MD as Consulting Physician (General Surgery) Fontaine, Belinda Block, MD (Inactive) as Consulting Physician (Gynecology) Brand Males, MD as Consulting Physician (Pulmonary Disease) Kyung Rudd, MD as Consulting Physician (Radiation Oncology) Gardenia Phlegm, NP as Nurse Practitioner (Hematology and Oncology) Ardis Hughs, MD as Attending Physician (Urology) Melida Quitter, MD as Consulting Physician (Otolaryngology)  DIAGNOSIS:    ICD-10-CM   1. Malignant neoplasm of lower-inner quadrant of left breast in female, estrogen receptor positive ([place])  C50.312    Z17.0     SUMMARY OF ONCOLOGIC HISTORY: Oncology History  Malignant neoplasm of lower-inner quadrant of left breast in female, estrogen receptor positive ([place])  09/26/2017 Initial Diagnosis   Screening detected left breast calcifications 1.1 cm span axilla negative, biopsy revealed grade 2 invasive ductal carcinoma with DCIS ER 100% PR 2%, HER-2 negative ratio 0.78, Ki-67 20%, T1CN0 stage Ia clinical stage AJCC 8   11/02/2017 Surgery   Left Lumpectomy: IDC grade 2, 1.2 cm, 0/3 LN Neg, , ER 100% PR 2%, HER-2 negative ratio 0.78, Ki-67 20% T1c N0 Stage 1A   12/19/2017 - 01/09/2018 Radiation Therapy   Adjuvant radiation therapy   01/2018 - 04/05/2018 Anti-estrogen oral therapy   Letrozole daily stopped because of depression and hot flashes and myalgias     CHIEF COMPLIANT: Follow-up of left breast cancer on surveillance   INTERVAL HISTORY: Cynthia Thornton is a 74 y.o. with above-mentioned history of left breast cancer treated with lumpectomy, radiation, and who could not tolerate antiestrogen therapy with letrozole. She is currently on surveillance. Mammogram on 09/18/19 showed no evidence of malignancy bilaterally. She  presents to the clinic today for annual follow-up. Macular rash on the left breast since 6 months.  She has seen to dermatologist but they have not given her any answers.  She has a known history of eczema on the neck.  She tried to put steroid creams and it appears to improve but it does not fully go away.  There was never any papular or pustular lesions at all.  They seem to wax and wane periodically.  She is frustrated about this.  She is also anxious that she has not been on oral antiestrogen therapy and wants to reconsider taking them at this time.  ALLERGIES:  is allergic to doxycycline, other, oxycodone, sulfa antibiotics, trimethoprim sulfate [trimethoprim], codeine, and propranolol hcl.  MEDICATIONS:  Current Outpatient Medications  Medication Sig Dispense Refill  . aspirin EC 81 MG tablet Take 81 mg by mouth daily.    . B Complex-C (B-COMPLEX WITH VITAMIN C) tablet Take 1 tablet by mouth daily.    . cetirizine (ZYRTEC) 10 MG tablet Take 10 mg by mouth daily.    . Cholecalciferol (VITAMIN D) 2000 units CAPS Take 2,000 Units by mouth daily.    . clorazepate (TRANXENE) 7.5 MG tablet Take 1 tablet (7.5 mg total) by mouth at bedtime as needed for anxiety. 30 tablet 2  . famotidine (PEPCID) 20 MG tablet Take 1 tablet (20 mg total) by mouth daily. 30 tablet 0  . letrozole (FEMARA) 2.5 MG tablet Take 1 tablet (2.5 mg total) by mouth daily. 30 tablet 0  . omeprazole (PRILOSEC) 40 MG capsule Take 1 capsule (40 mg total) by mouth daily. 30 capsule 0  . promethazine (PHENERGAN) 25 MG suppository Place 1 suppository (25  mg total) rectally every 12 (twelve) hours as needed for nausea or vomiting. 30 each 1   No current facility-administered medications for this visit.    PHYSICAL EXAMINATION: ECOG PERFORMANCE STATUS: 1 - Symptomatic but completely ambulatory  Vitals:   08/06/20 1026  BP: 116/65  Pulse: 79  Resp: 18  Temp: 98.4 F (36.9 C)  SpO2: 98%   Filed Weights   08/06/20 1026    Weight: 141 lb 9.6 oz (64.2 kg)    BREAST: No palpable masses or nodules in either right or left breasts. No palpable axillary supraclavicular or infraclavicular adenopathy no breast tenderness or nipple discharge. (exam performed in the presence of a chaperone)  LABORATORY DATA:  I have reviewed the data as listed CMP Latest Ref Rng & Units 05/19/2020 05/18/2020 04/28/2020  Glucose 70 - 99 mg/dL 94 133(H) 98  BUN 6 - 23 mg/dL _0 Creatinine 0.40 - 1.20 mg/dL 1.03 0.78 0.92  Sodium 135 - 145 mEq/L 136 138 140  Potassium 3.5 - 5.1 mEq/L 4.2 4.2 3.7  Chloride 96 - 112 mEq/L 102 100 105  CO2 19 - 32 mEq/L _1 Calcium 8.4 - 10.5 mg/dL 9.4 9.7 8.7(L)  Total Protein 6.0 - 8.3 g/dL 6.8 7.2 -  Total Bilirubin 0.2 - 1.2 mg/dL 0.4 0.6 -  Alkaline Phos 39 - 117 U/L 98 106 -  AST 0 - 37 U/L 23 29 -  ALT 0 - 35 U/L 23 28 -    Lab Results  Component Value Date   WBC 5.0 05/19/2020   HGB 10.9 (L) 05/19/2020   HCT 31.4 (L) 05/19/2020   MCV 89.0 05/19/2020   PLT 236.0 05/19/2020   NEUTROABS 2.5 05/19/2020    ASSESSMENT & PLAN:  Malignant neoplasm of lower-inner quadrant of left breast in female, estrogen receptor positive ([place]) 10/31/17: Left Lumpectomy: IDC grade 2, 1.2 cm, 0/3 LN Neg, , ER 100% PR 2%, HER-2 negative ratio 0.78, Ki-67 20% T1c N0 Stage 1A  Treatment summary: 1. Oncotype DX16: 4% risk of recurrence with 5 years of tamoxifen: Low risk 2. Adjuvant radiation therapy2/20/2019 to 01/11/2018 3. Adjuvant antiestrogen therapy: With letrozole 2.5 mg daily to started4/10/2017 stopped within a few months because of hot flashes and depression  She could not tolerate Effexor because of nausea and vomiting.   Letrozole: Patient is interested in trying letrozole once again.  So we will send a prescription for letrozole 30 tablets.  She will start at half a tablet daily and see if she can tolerate it.  If she can tolerate it then she will go to full tablet daily.  Rash on  the breast: It could be eczema.  It does not feel like a cutaneous recurrence of breast cancer.  I instructed her to get a biopsy of the skin at the dermatologists office.  Breast cancer surveillance: 1.  Mammograms will be done November 2020 2.  Breast exam 08/06/2020: Benign  Bone density 04/20/2020: T score -3: Osteoporosis: This is on her forearm and therefore she is not on any bisphosphonates at this time.  This is being managed by her gynecologist.  We will touch base with her in 1 month via MyChart virtual visit to assess tolerance to letrozole as well as skin biopsy results.    No orders of the defined types were placed in this encounter.  The patient has a good understanding of the overall plan. she agrees with it. she will call with any problems  that may develop before the next visit here.  Total time spent: 30 mins including face to face time and time spent for planning, charting and coordination of care  Nicholas Lose, MD 08/06/2020  I, Cloyde Reams Dorshimer, am acting as scribe for Dr. Nicholas Lose.  I have reviewed the above documentation for accuracy and completeness, and I agree with the above.

## 2020-08-06 ENCOUNTER — Other Ambulatory Visit: Payer: Self-pay

## 2020-08-06 ENCOUNTER — Inpatient Hospital Stay: Payer: Medicare PPO | Attending: Hematology and Oncology | Admitting: Hematology and Oncology

## 2020-08-06 DIAGNOSIS — C50312 Malignant neoplasm of lower-inner quadrant of left female breast: Secondary | ICD-10-CM | POA: Diagnosis not present

## 2020-08-06 DIAGNOSIS — F32A Depression, unspecified: Secondary | ICD-10-CM | POA: Diagnosis not present

## 2020-08-06 DIAGNOSIS — Z170 Estrogen receptor positive status [ER+]: Secondary | ICD-10-CM

## 2020-08-06 DIAGNOSIS — Z79899 Other long term (current) drug therapy: Secondary | ICD-10-CM | POA: Diagnosis not present

## 2020-08-06 DIAGNOSIS — Z79811 Long term (current) use of aromatase inhibitors: Secondary | ICD-10-CM | POA: Diagnosis not present

## 2020-08-06 DIAGNOSIS — R232 Flushing: Secondary | ICD-10-CM | POA: Diagnosis not present

## 2020-08-06 DIAGNOSIS — Z881 Allergy status to other antibiotic agents status: Secondary | ICD-10-CM | POA: Insufficient documentation

## 2020-08-06 DIAGNOSIS — Z885 Allergy status to narcotic agent status: Secondary | ICD-10-CM | POA: Diagnosis not present

## 2020-08-06 DIAGNOSIS — M791 Myalgia, unspecified site: Secondary | ICD-10-CM | POA: Diagnosis not present

## 2020-08-06 DIAGNOSIS — Z882 Allergy status to sulfonamides status: Secondary | ICD-10-CM | POA: Diagnosis not present

## 2020-08-06 DIAGNOSIS — R112 Nausea with vomiting, unspecified: Secondary | ICD-10-CM | POA: Diagnosis not present

## 2020-08-06 DIAGNOSIS — R21 Rash and other nonspecific skin eruption: Secondary | ICD-10-CM | POA: Diagnosis not present

## 2020-08-06 MED ORDER — LETROZOLE 2.5 MG PO TABS
2.5000 mg | ORAL_TABLET | Freq: Every day | ORAL | 0 refills | Status: DC
Start: 2020-08-06 — End: 2020-08-30

## 2020-08-06 NOTE — Assessment & Plan Note (Signed)
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 Oncotype DX16: 4% risk of recurrence with 5 years of tamoxifen: Low risk 2. Adjuvant radiation therapy2/20/2019 to 01/11/2018 3. Adjuvant antiestrogen therapy: With letrozole 2.5 mg daily to started4/10/2017 stopped within a few months because of hot flashes and depression  She could not tolerate Effexor because of nausea and vomiting. She does not want to take any further antiestrogen therapies  Breast cancer surveillance: 1.  Mammograms will be done November 2020 2.  Breast exam Nov 2021: Benign  Bone density 04/20/2020: T score -3: Osteoporosis Return to clinic in 1 year for follow-up.

## 2020-08-09 ENCOUNTER — Encounter: Payer: Self-pay | Admitting: Internal Medicine

## 2020-08-12 ENCOUNTER — Other Ambulatory Visit: Payer: Self-pay

## 2020-08-12 ENCOUNTER — Ambulatory Visit: Payer: Medicare PPO | Admitting: Internal Medicine

## 2020-08-12 ENCOUNTER — Encounter: Payer: Self-pay | Admitting: Internal Medicine

## 2020-08-12 VITALS — BP 122/72 | HR 68 | Temp 97.3°F | Ht 64.0 in | Wt 142.0 lb

## 2020-08-12 DIAGNOSIS — J479 Bronchiectasis, uncomplicated: Secondary | ICD-10-CM | POA: Diagnosis not present

## 2020-08-12 NOTE — Progress Notes (Signed)
OV 10/04/2018  Subjective:  Patient ID: Cynthia Thornton, female , DOB: 02/17/1946 , age 74 y.o. , MRN: [id number] , ADDRESS: [address] [place] [address]   10/04/2018 -   Chief Complaint  Patient presents with  . Follow-up    f/u bronchiectasis, pt reports she is doing well , flutter valve has helped her get mucus up, some cough but has gotten better, mucus is thinner     HPI BILL MCVEY 74 y.o. -presents for follow-up of her mild bronchiectasis with mild sputum production.  Last visit September 2019 we introduce flutter valve after discussing several options because sputum volume had increased.  At this point in time she says flutter valve works [employer] Technology [employer].  She occasionally uses Mucinex.  She tries to avoid medicines.  She is up-to-date with her pulmonary vaccines.  She would benefit from a new Shingrix shingles vaccine which she will talk to her primary care about.     ROS - per HPI   OV 08/12/2020  Subjective:  Patient ID: Cynthia Thornton, female , DOB: Jan 19, 1946 , age 63 y.o. , MRN: [id number] , ADDRESS: [place] [id number] PCP Colon Branch, MD Patient Care Team: Colon Branch, MD as PCP - Gaston Islam, MD as Consulting Physician (Orthopedic Surgery) Nicholas Lose, MD as Consulting Physician (Hematology and Oncology) [name], MD as Consulting Physician (General Surgery) Fontaine, Belinda Block, MD (Inactive) as Consulting Physician (Gynecology) Brand Males, MD as Consulting Physician (Pulmonary Disease) Kyung Rudd, MD as Consulting Physician (Radiation Oncology) Delice Bison Charlestine Massed, NP as Nurse Practitioner (Hematology and Oncology) Ardis Hughs, MD as Attending Physician (Urology) Melida Quitter, MD as Consulting Physician (Otolaryngology)  This Provider for this visit: Treatment Team:  Attending Provider: Brand Males, MD    08/12/2020 -   Chief Complaint  Patient presents with  . Follow-up     Bronchiectasis, doing well     HPI Cynthia Thornton 74 y.o. -follow-up for mild bronchiectasis.  Last seen in December 2019.  Then after the onset of the pandemic and never saw her.  She has been isolating.  She is fully vaccinated.  In July 2020 after exposure to work she had a bronchiectasis exacerbation.  She says since then she has been doing well.  She says that after seeing the nurse practitioner and starting Mucinex as needed and flutter valve she has been doing well.  There is no acute issues.     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 No evidence of acute cardiopulmonary disease. Electronically Signed   By: Julian Hy M.D.   On: 05/25/2016 17:16   ROS - per HPI     has a past medical history of Arthritis, Breast cancer, left [facility]) (oncologist-- dr [name]), COPD (chronic obstructive pulmonary disease) ([place]), Dyslipidemia, Eczema, Family history of breast cancer, Family history of colon cancer, GERD (gastroesophageal reflux disease), Hiatal hernia, History of esophageal stricture, History of external beam radiation therapy, History of hyperthyroidism, Insomnia, Multinodular goiter (last thyroid ultrasound in epic 12-18-2018), Osteopenia (02/2018), Personal history of colonic polyps (09/06/2001), Pityriasis rosea, PONV (postoperative nausea and vomiting), Primary osteoarthritis of left knee, Rhomboid pain, and Wears contact lenses.   reports that she quit smoking about 24 years ago. She has a 3.00 pack-year smoking history. She has never used smokeless tobacco.  Past Surgical History:  Procedure Laterality Date  . BREAST LUMPECTOMY WITH RADIOACTIVE SEED AND SENTINEL LYMPH NODE BIOPSY Left 11/02/2017   Procedure: LEFT BREAST LUMPECTOMY WITH RADIOACTIVE SEED AND LEFT AXILLARY DEEP SENTINEL LYMPH NODE BIOPSY, INJECT BLUE DYE LEFT BREAST;   Surgeon: [name], MD;  Location: [place];  Service: General;  Laterality: Left;  . BREAST SURGERY  2000   Breast cyst removed, left  . LAPAROSCOPIC BILATERAL SALPINGO OOPHERECTOMY Bilateral 10/13/2019   Procedure: LAPAROSCOPIC BILATERAL SALPINGO OOPHORECTOMY LYSIS OF ADHESIONS PERITONEAL WASHINGS ;  Surgeon: [name], MD;  Location: [place];  Service: Gynecology;  Laterality: Bilateral;  request 7:30am OR time in [place] Gyn block requests one hour  . TONSILLECTOMY  age 47  . TUBAL LIGATION Bilateral yrs ago  . VAGINAL HYSTERECTOMY  1991   Right ovarian cystectomy  . VIDEO BRONCHOSCOPY Bilateral 03/16/2015   Procedure: VIDEO BRONCHOSCOPY WITHOUT FLUORO;  Surgeon: Brand Males, MD;  [facility] ENDOSCOPY;  Service: Endoscopy;  Laterality: Bilateral;    Allergies  Allergen Reactions  . Doxycycline Rash  . Other Other (See Comments)    Surgical glue : hives  . Oxycodone Nausea And Vomiting  . Sulfa Antibiotics Hives  . Trimethoprim Sulfate [Trimethoprim]     rash  . Codeine Nausea Only  . Propranolol Hcl Other (See Comments)    "Dizziness and heart racing"    Immunization History  Administered Date(s) Administered  . Influenza Split 08/31/2011, 07/30/2014  . Influenza Whole 08/30/2009, 07/30/2010  . Influenza, High Dose Seasonal PF 07/05/2018, 08/02/2019  . Influenza,inj,Quad PF,6+ Mos 09/30/2012, 10/10/2013, 07/26/2015  . Influenza-Unspecified 08/18/2016  . Moderna SARS-COVID-2 Vaccination 11/29/2019, 12/29/2019  . Pneumococcal Conjugate-13 10/10/2013  . Pneumococcal Polysaccharide-23 07/27/2015  . Td 12/02/2008  . Tdap 01/03/2019  . Zoster 12/02/2008  . Zoster Recombinat (Shingrix) 01/03/2019, 04/18/2019    Family History  Problem Relation Age of Onset  . Osteoporosis Mother   . Lung cancer Mother   . Stroke Mother   . Diabetes Father   . Hypertension Father   . Osteoporosis Father   . Colon cancer Father  60  . Breast cancer Sister        Age 43  . Ovarian cancer Maternal Aunt 60  . Breast cancer Cousin        maternal-Age 19  . Colon cancer Paternal Uncle 51  . Colon cancer Cousin        maternal first cousin  . Breast cancer Maternal Aunt        great aunt- Age unknown  . Crohn's disease Son   . Leukemia Maternal Grandfather      Current Outpatient Medications:  .  aspirin EC 81 MG tablet, Take 81 mg by mouth daily., Disp: , Rfl:  .  B Complex-C (B-COMPLEX WITH VITAMIN C) tablet, Take 1 tablet by mouth daily., Disp: , Rfl:  .  cetirizine (ZYRTEC) 10 MG tablet, Take 10 mg by mouth daily., Disp: , Rfl:  .  Cholecalciferol (VITAMIN D) 2000 units CAPS, Take 2,000 Units by mouth daily., Disp: , Rfl:  .  clorazepate (TRANXENE) 7.5 MG tablet, Take 1 tablet (7.5 mg total) by mouth at bedtime as needed for anxiety., Disp: 30 tablet, Rfl: 2 .  famotidine (PEPCID) 20 MG tablet, Take 1 tablet (20 mg total) by mouth daily., Disp: 30 tablet, Rfl: 0 .  letrozole (FEMARA) 2.5 MG tablet, Take 1 tablet (2.5 mg total) by mouth daily., Disp: 30 tablet, Rfl: 0 .  omeprazole (PRILOSEC) 40  MG capsule, Take 1 capsule (40 mg total) by mouth daily., Disp: 30 capsule, Rfl: 0 .  promethazine (PHENERGAN) 25 MG suppository, Place 1 suppository (25 mg total) rectally every 12 (twelve) hours as needed for nausea or vomiting., Disp: 30 each, Rfl: 1      Objective:   Vitals:   08/12/20 1625  BP: 122/72  Pulse: 68  Temp: (!) 97.3 F (36.3 C)  TempSrc: Oral  SpO2: 98%  Weight: 142 lb (64.4 kg)  Height: 5\' 4"  (1.626 m)    Estimated body mass index is 24.37 kg/m as calculated from the following:   Height as of this encounter: 5\' 4"  (1.626 m).   Weight as of this encounter: 142 lb (64.4 kg).  @WEIGHTCHANGE @  Filed Weights   08/12/20 1625  Weight: 142 lb (64.4 kg)     Physical Exam    General: No distress. Looks well Neuro: Alert and Oriented x 3. GCS 15. Speech normal Psych: Pleasant Resp:   Barrel Chest - n.  Wheeze - no, Crackles - no, No overt respiratory distress CVS: Normal heart sounds. Murmurs - no Ext: Stigmata of Connective Tissue Disease - no HEENT: Normal upper airway. PEERL +. No post nasal drip        Assessment:       ICD-10-CM   1. Bronchiectasis without complication ([place])  Q76.1        Plan:     Patient Instructions     ICD-10-CM   1. Bronchiectasis without complication ([place])  P50.9    Glad you are doing well  Plan -Continue flutter valve and as needed Mucinex -Recommend Covid booster -okay to take [place] because it is the same technology.as moderna  Follow-up -9 months or sooner if needed     SIGNATURE    Dr. Brand Males, M.D., F.C.C.P,  Pulmonary and Critical Care Medicine Staff Physician, [place] Director - Interstitial Lung Disease  Program  [facility] [place], [place], [address]  Pager: [phone], If no answer or between  15:00h - 7:00h: call [phone] Telephone: [phone]  4:54 PM 08/12/2020

## 2020-08-12 NOTE — Patient Instructions (Signed)
ICD-10-CM   1. Bronchiectasis without complication ([place])  X52.1    Glad you are doing well  Plan -Continue flutter valve and as needed Mucinex -Recommend Covid booster -okay to take [place] because it is the same technology.as moderna  Follow-up -9 months or sooner if needed

## 2020-08-13 ENCOUNTER — Ambulatory Visit: Payer: Medicare PPO | Admitting: Internal Medicine

## 2020-08-13 ENCOUNTER — Other Ambulatory Visit: Payer: Self-pay

## 2020-08-13 ENCOUNTER — Encounter: Payer: Self-pay | Admitting: Internal Medicine

## 2020-08-13 VITALS — BP 106/72 | HR 69 | Temp 97.7°F | Resp 16 | Ht 64.0 in | Wt 139.1 lb

## 2020-08-13 DIAGNOSIS — D649 Anemia, unspecified: Secondary | ICD-10-CM

## 2020-08-13 DIAGNOSIS — N390 Urinary tract infection, site not specified: Secondary | ICD-10-CM | POA: Diagnosis not present

## 2020-08-13 DIAGNOSIS — Z79899 Other long term (current) drug therapy: Secondary | ICD-10-CM | POA: Diagnosis not present

## 2020-08-13 DIAGNOSIS — F419 Anxiety disorder, unspecified: Secondary | ICD-10-CM | POA: Diagnosis not present

## 2020-08-13 NOTE — Progress Notes (Signed)
Subjective:    Patient ID: Cynthia Thornton, female    DOB: 1946/09/20, 74 y.o.   MRN: [id number]  DOS:  08/13/2020 Type of visit - description: Routine checkup Patient developed UTI symptoms few days ago, mainly suprapubic pressure and urinary frequency.  No fever chills. Went to urology, urine culture was done, she was prescribed an antibiotic as well as methenamine for few weeks. Once the culture was ready antibiotic was switched to amoxicillin.  Review of Systems Currently improving, no fever chills.   Past Medical History:  Diagnosis Date  . Arthritis   . Breast cancer, left [facility]) oncologist-- dr [name]   dx 09-26-2017--- IDC, Stage IA, Grade 2, ER/PR +;  HER2 negative;  s/p  breast lumpectomy w/ node dissection 11-02-2017;  completed radiation 01-09-2018 (left breast genetic screening panel 2017 with variant of unknown significance AXINA)  . COPD (chronic obstructive pulmonary disease) ([place])    pulmologist-- dr Dillard Essex-- w/ chronic lingular scarring  (last exceratbation bronchiectasis 07/ 2020)  . Dyslipidemia    followed by cardiology/ vascular-- dr g. adams ([facility] in [place])  family history strokes  . Eczema   . Family history of breast cancer   . Family history of colon cancer   . GERD (gastroesophageal reflux disease)   . Hiatal hernia   . History of esophageal stricture    s/p dilatation 2008  . History of external beam radiation therapy    left breast 12-19-2017  to 01-09-2018  . History of hyperthyroidism    endocrinologist-- (lov 04-11-2019 epic) dr Loanne Drilling, dx 2009 due to multinodular goiter ,  had taken medication for few months 2010 stopped due to norma TFT;    . Insomnia   . Multinodular goiter last thyroid ultrasound in epic 12-18-2018   endocrinologist-- dr Loanne Drilling,  dx 2009 w/ hyperthroidism with medication until normal TFT in 2010;    . Osteopenia 02/2018   T score -2.4 distal third of radius.  FRAX 11% / 1.8% stable at other points of  interest to include spine, right and left hip  . Personal history of colonic polyps 09/06/2001   hyperplastic  . Pityriasis rosea   . PONV (postoperative nausea and vomiting)    severe  . Primary osteoarthritis of left knee    Severe Patellofemoral arthritis  . 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 Wears contact lenses     Past Surgical History:  Procedure Laterality Date  . BREAST LUMPECTOMY WITH RADIOACTIVE SEED AND SENTINEL LYMPH NODE BIOPSY Left 11/02/2017   Procedure: LEFT BREAST LUMPECTOMY WITH RADIOACTIVE SEED AND LEFT AXILLARY DEEP SENTINEL LYMPH NODE BIOPSY, INJECT BLUE DYE LEFT BREAST;  Surgeon: [name], MD;  Location: [place];  Service: General;  Laterality: Left;  . BREAST SURGERY  2000   Breast cyst removed, left  . LAPAROSCOPIC BILATERAL SALPINGO OOPHERECTOMY Bilateral 10/13/2019   Procedure: LAPAROSCOPIC BILATERAL SALPINGO OOPHORECTOMY LYSIS OF ADHESIONS PERITONEAL WASHINGS ;  Surgeon: [name], MD;  Location: [place];  Service: Gynecology;  Laterality: Bilateral;  request 7:30am OR time in [place] Gyn block requests one hour  . TONSILLECTOMY  age 14  . TUBAL LIGATION Bilateral yrs ago  . VAGINAL HYSTERECTOMY  1991   Right ovarian cystectomy  . VIDEO BRONCHOSCOPY Bilateral 03/16/2015   Procedure: VIDEO BRONCHOSCOPY WITHOUT FLUORO;  Surgeon: Brand Males, MD;  Location: [facility] ENDOSCOPY;  Service: Endoscopy;  Laterality: Bilateral;    Allergies  as of 08/13/2020      Reactions   Doxycycline Rash   Other Other (See Comments)   Surgical glue : hives   Oxycodone Nausea And Vomiting   Sulfa Antibiotics Hives   Trimethoprim Sulfate [trimethoprim]    rash   Codeine Nausea Only   Propranolol Hcl Other (See Comments)   "Dizziness and heart racing"      Medication List       Accurate as of August 13, 2020  9:30 AM. If you have any questions, ask your nurse or  doctor.        aspirin EC 81 MG tablet Take 81 mg by mouth daily.   B-complex with vitamin C tablet Take 1 tablet by mouth daily.   cetirizine 10 MG tablet Commonly known as: ZYRTEC Take 10 mg by mouth daily.   clorazepate 7.5 MG tablet Commonly known as: TRANXENE Take 1 tablet (7.5 mg total) by mouth at bedtime as needed for anxiety.   CRANBERRY PO Take 2 tablets by mouth in the morning and at bedtime.   famotidine 20 MG tablet Commonly known as: PEPCID Take 1 tablet (20 mg total) by mouth daily.   letrozole 2.5 MG tablet Commonly known as: FEMARA Take 1 tablet (2.5 mg total) by mouth daily.   methenamine 1 g tablet Commonly known as: HIPREX Take 1 g by mouth 2 (two) times daily with a meal. Take with 552m of vitamin c w/ each tablet   omeprazole 40 MG capsule Commonly known as: PRILOSEC Take 1 capsule (40 mg total) by mouth daily.   PROBIOTIC PO Take by mouth.   promethazine 25 MG suppository Commonly known as: Phenergan Place 1 suppository (25 mg total) rectally every 12 (twelve) hours as needed for nausea or vomiting.   Vitamin D 50 MCG (2000 UT) Caps Take 2,000 Units by mouth daily.          Objective:   Physical Exam BP 106/72 (BP Location: Right Arm, Patient Position: Sitting, Cuff Size: Small)   Pulse 69   Temp 97.7 F (36.5 C) (Oral)   Resp 16   Ht 5' 4"  (1.626 m)   Wt 139 lb 2 oz (63.1 kg)   SpO2 96%   BMI 23.88 kg/m  General:   Well developed, NAD, BMI noted.  HEENT:  Normocephalic . Face symmetric, atraumatic  Abdomen:  Not distended, soft, non-tender. No rebound or rigidity.  No CVA tenderness Skin: Not pale. Not jaundice Lower extremities: no pretibial edema bilaterally  Neurologic:  alert & oriented X3.  Speech normal, gait appropriate for age and unassisted Psych--  Cognition and judgment appear intact.  Cooperative with normal attention span and concentration.  Behavior appropriate. No anxious or depressed appearing.      Assessment    Assessment  Bronchiectasis Dr RChase CallerAnxiety - Insomnia : tranxene   At hs, rx by gyn, previously, I'll Rx if needed  Endocrinology Dr. ELoanne Drillingq 3 years, Last visit 03/2015 --H/o thyrotoxicosis on remission --Goiter   Osteopenia --T score -1.5  (2015), dexa 02-2016 , dexa 03/19/2018 (at gyn) DJD -- [facility]: GERD, HH and esophageal stricture EGD 2007, colon polyps Breast cancer, DX 08-2017, s/p lumpectomy,  XRT, B salpingo-oophorectomy History of many years: Symptoms recur 319sand 456s H/o DUB Recurrent UTIs, onset ~ 2018  PLAN Recurring UTIs: Problem started approximately 3 years ago.    She sees urology. rec  to discuss the issue with gynecology as well, ? vaginal dryness . She  is currently not sexually active. Current UTI seems to be improving with amoxicillin and she was prescribed daily ABX.    Encouraged to follow-up with urology as recommended in 4 weeks Anxiety, insomnia: On Tranxene, contract provided Mild anemia: Per chart review, check CBC and BMP RTC CPX 11-2020  This visit occurred during the SARS-CoV-2 public health emergency.  Safety protocols were in place, including screening questions prior to the visit, additional usage of staff PPE, and extensive cleaning of exam room while observing appropriate contact time as indicated for disinfecting solutions.

## 2020-08-13 NOTE — Patient Instructions (Signed)
   GO TO THE LAB : Get the blood work     [place], [place] Come back for  A physical by 11/2020

## 2020-08-13 NOTE — Progress Notes (Signed)
Pre visit review using our clinic review tool, if applicable. No additional management support is needed unless otherwise documented below in the visit note. 

## 2020-08-14 LAB — CBC WITH DIFFERENTIAL/PLATELET
Absolute Monocytes: 618 cells/uL (ref 200–950)
Basophils Absolute: 42 cells/uL (ref 0–200)
Basophils Relative: 0.7 %
Eosinophils Absolute: 270 cells/uL (ref 15–500)
Eosinophils Relative: 4.5 %
HCT: 38.9 % (ref 35.0–45.0)
Hemoglobin: 13 g/dL (ref 11.7–15.5)
Lymphs Abs: 1500 cells/uL (ref 850–3900)
MCH: 30.2 pg (ref 27.0–33.0)
MCHC: 33.4 g/dL (ref 32.0–36.0)
MCV: 90.3 fL (ref 80.0–100.0)
MPV: 9.7 fL (ref 7.5–12.5)
Monocytes Relative: 10.3 %
Neutro Abs: 3570 cells/uL (ref 1500–7800)
Neutrophils Relative %: 59.5 %
Platelets: 411 10*3/uL — ABNORMAL HIGH (ref 140–400)
RBC: 4.31 10*6/uL (ref 3.80–5.10)
RDW: 13.2 % (ref 11.0–15.0)
Total Lymphocyte: 25 %
WBC: 6 10*3/uL (ref 3.8–10.8)

## 2020-08-14 LAB — BASIC METABOLIC PANEL
BUN: 19 mg/dL (ref 7–25)
CO2: 26 mmol/L (ref 20–32)
Calcium: 9.8 mg/dL (ref 8.6–10.4)
Chloride: 100 mmol/L (ref 98–110)
Creat: 0.93 mg/dL (ref 0.60–0.93)
Glucose, Bld: 84 mg/dL (ref 65–99)
Potassium: 5.2 mmol/L (ref 3.5–5.3)
Sodium: 134 mmol/L — ABNORMAL LOW (ref 135–146)

## 2020-08-14 NOTE — Assessment & Plan Note (Signed)
Recurring UTIs: Problem started approximately 3 years ago.    She sees urology. rec  to discuss the issue with gynecology as well, ? vaginal dryness . She is currently not sexually active. Current UTI seems to be improving with amoxicillin and she was prescribed daily ABX.    Encouraged to follow-up with urology as recommended in 4 weeks Anxiety, insomnia: On Tranxene, contract provided Mild anemia: Per chart review, check CBC and BMP RTC CPX 11-2020

## 2020-08-15 LAB — DRUG MONITORING, PANEL 8 WITH CONFIRMATION, URINE
6 Acetylmorphine: NEGATIVE ng/mL (ref <10)
Alcohol Metabolites: NEGATIVE ng/mL
Alphahydroxyalprazolam: NEGATIVE ng/mL (ref <25)
Alphahydroxymidazolam: NEGATIVE ng/mL (ref <50)
Alphahydroxytriazolam: NEGATIVE ng/mL (ref <50)
Aminoclonazepam: NEGATIVE ng/mL (ref <25)
Amphetamines: NEGATIVE ng/mL (ref <500)
Benzodiazepines: POSITIVE ng/mL — AB (ref <100)
Buprenorphine, Urine: NEGATIVE ng/mL (ref <5)
Cocaine Metabolite: NEGATIVE ng/mL (ref <150)
Creatinine: 17.1 mg/dL
Hydroxyethylflurazepam: NEGATIVE ng/mL (ref <50)
Lorazepam: NEGATIVE ng/mL (ref <50)
MDMA: NEGATIVE ng/mL (ref <500)
Marijuana Metabolite: NEGATIVE ng/mL (ref <20)
Nordiazepam: NEGATIVE ng/mL (ref <50)
Opiates: NEGATIVE ng/mL (ref <100)
Oxazepam: 95 ng/mL — ABNORMAL HIGH (ref <50)
Oxidant: NEGATIVE ug/mL
Oxycodone: NEGATIVE ng/mL (ref <100)
Specific Gravity: 1.004 (ref >=1.0)
Temazepam: NEGATIVE ng/mL (ref <50)
pH: 5.8 (ref 4.5–9.0)

## 2020-08-15 LAB — DM TEMPLATE

## 2020-08-18 ENCOUNTER — Encounter: Payer: Self-pay | Admitting: Internal Medicine

## 2020-08-18 ENCOUNTER — Other Ambulatory Visit: Payer: Self-pay | Admitting: Internal Medicine

## 2020-08-18 DIAGNOSIS — L309 Dermatitis, unspecified: Secondary | ICD-10-CM | POA: Diagnosis not present

## 2020-08-27 ENCOUNTER — Encounter: Payer: Self-pay | Admitting: Nurse Practitioner

## 2020-08-27 ENCOUNTER — Ambulatory Visit: Payer: Medicare PPO | Admitting: Nurse Practitioner

## 2020-08-27 VITALS — BP 118/70 | HR 60 | Ht 63.5 in | Wt 142.0 lb

## 2020-08-27 DIAGNOSIS — K219 Gastro-esophageal reflux disease without esophagitis: Secondary | ICD-10-CM

## 2020-08-27 DIAGNOSIS — M7918 Myalgia, other site: Secondary | ICD-10-CM

## 2020-08-27 MED ORDER — FAMOTIDINE 20 MG PO TABS
20.0000 mg | ORAL_TABLET | Freq: Every day | ORAL | 4 refills | Status: DC
Start: 2020-08-27 — End: 2021-12-12

## 2020-08-27 MED ORDER — OMEPRAZOLE 40 MG PO CPDR: DELAYED_RELEASE_CAPSULE | ORAL | 4 refills | Status: DC

## 2020-08-27 NOTE — Progress Notes (Signed)
   ASSESSMENT AND PLAN    # 74 yo female with worsening of left lateral rib cage pain. She feels like the pain is coming from an area underneath the ribcage and she doesn't have any chest wall / rib tenderness on exam so she may be right.  However,  she has no associated GI symptoms. She has a significant deformity of her spine with a history of scolosis so I wonder if that is contributing to her pain. . No bony lesions on CXR in June. Stomach, spleen and pancreas unremarkable on non=contrast CT scan in June.   --Trial of NSAIDS + Salonpas patches to affected area x 7 days. She will call office in ~ 10 days with an update.   # GERD, doing well on am PPI and Pepcid at night --Will refill GERD medications, 90 day supply with 4 refills   # Hx of adenomatous colon polyps. She is up to date on surveillance colonoscopy, last one in July 2020  HISTORY OF PRESENT ILLNESS     Primary Gastroenterologist :Jay Pyrtle, MD  Chief Complaint : refill on GERD medication   Cynthia Thornton is a 74 y.o. female with PMH / PSH significant for,  but not necessarily limited to: breast cancer, COPD, cardiomegaly, scoliosis, hyperlipidemia, colon polyps  Patient's GERD has generally manifested as chest pain,. She is asymptomatic on am PPI and pepcid at night.   Patient came for refill on GERD medications but mentions that for several years she has had intermittent left upper abdominal pain. The pain has been occurring much more frequently over the last 10 or so days. She points to area involving left lateral ribcage. She feels like the pain is beneath her ribcage.  She is worried because of history of breast caner. She asks about her pancreas. CXR in June showed not bony lesions. The pain is not related to position / physical activity nor eating.  Pain can occur up to several times a day and last 5-10 minutes. She hasn't tried tylenol or any other pain relievers. Appetite is fine, weight is stable. No nausea or  vomiting.     Past Medical History:  Diagnosis Date  . Arthritis   . Breast cancer, left (HCC) oncologist-- dr gudena   dx 09-26-2017--- IDC, Stage IA, Grade 2, ER/PR +;  HER2 negative;  s/p  breast lumpectomy w/ node dissection 11-02-2017;  completed radiation 01-09-2018 (left breast genetic screening panel 2017 with variant of unknown significance AXINA)  . COPD (chronic obstructive pulmonary disease) (HCC)    pulmologist-- dr ramaswany-- w/ chronic lingular scarring  (last exceratbation bronchiectasis 07/ 2020)  . Dyslipidemia    followed by cardiology/ vascular-- dr g. adams ([facility] in [place])  family history strokes  . Eczema   . Family history of breast cancer   . Family history of colon cancer   . GERD (gastroesophageal reflux disease)   . Hiatal hernia   . History of esophageal stricture    s/p dilatation 2008  . History of external beam radiation therapy    left breast 12-19-2017  to 01-09-2018  . History of hyperthyroidism    endocrinologist-- (lov 04-11-2019 epic) dr ellison, dx 2009 due to multinodular goiter ,  had taken medication for few months 2010 stopped due to norma TFT;    . Insomnia   . Multinodular goiter last thyroid ultrasound in epic 12-18-2018   endocrinologist-- dr ellison,  dx 2009 w/ hyperthroidism with medication until normal   TFT in 2010;    . Osteopenia 02/2018   T score -2.4 distal third of radius.  FRAX 11% / 1.8% stable at other points of interest to include spine, right and left hip  . Personal history of colonic polyps 09/06/2001   hyperplastic  . Pityriasis rosea   . PONV (postoperative nausea and vomiting)    severe  . Primary osteoarthritis of left knee    Severe Patellofemoral arthritis  . 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 Wears contact lenses     Current Medications, Allergies, Past Surgical History, Family History and Social History were reviewed in [facility]  electronic medical record.   Current Outpatient Medications  Medication Sig Dispense Refill  . aspirin EC 81 MG tablet Take 81 mg by mouth daily.    . B Complex-C (B-COMPLEX WITH VITAMIN C) tablet Take 1 tablet by mouth daily.    . cetirizine (ZYRTEC) 10 MG tablet Take 10 mg by mouth daily.    . Cholecalciferol (VITAMIN D) 2000 units CAPS Take 2,000 Units by mouth daily.    . clorazepate (TRANXENE) 7.5 MG tablet Take 1 tablet (7.5 mg total) by mouth at bedtime as needed for anxiety. 30 tablet 2  . CRANBERRY PO Take 2 tablets by mouth in the morning and at bedtime.    . famotidine (PEPCID) 20 MG tablet Take 1 tablet (20 mg total) by mouth daily. 30 tablet 0  . letrozole (FEMARA) 2.5 MG tablet Take 1 tablet (2.5 mg total) by mouth daily. 30 tablet 0  . methenamine (HIPREX) 1 g tablet Take 1 g by mouth 2 (two) times daily with a meal. Take with 500mg of vitamin c w/ each tablet    . omeprazole (PRILOSEC) 40 MG capsule TAKE 1 CAPSULE BY MOUTH EVERY DAY 30 capsule 0  . Probiotic Product (PROBIOTIC PO) Take by mouth.    . promethazine (PHENERGAN) 25 MG suppository Place 1 suppository (25 mg total) rectally every 12 (twelve) hours as needed for nausea or vomiting. (Patient not taking: Reported on 08/13/2020) 30 each 1   No current facility-administered medications for this visit.    Review of Systems: No chest pain. No shortness of breath. No urinary complaints.   PHYSICAL EXAM :    Wt Readings from Last 3 Encounters:  08/27/20 142 lb (64.4 kg)  08/13/20 139 lb 2 oz (63.1 kg)  08/12/20 142 lb (64.4 kg)    BP 118/70 (BP Location: Right Arm, Patient Position: Sitting)   Pulse 60   Ht 5' 3.5" (1.613 m)   Wt 142 lb (64.4 kg)   BMI 24.76 kg/m  Constitutional:  Pleasant female in no acute distress. Psychiatric: Normal mood and affect. Behavior is normal. EENT: Pupils normal.  Conjunctivae are normal. No scleral icterus. Neck supple.  Cardiovascular: Normal rate, regular rhythm. No  edema Pulmonary/chest: Effort normal and breath sounds normal. No wheezing, rales or rhonchi. Abdominal: Soft, nondistended, nontender. Bowel sounds active throughout. There are no masses palpable. No hepatomegaly. Musculoskeletal: no chest wall /rib pain with palpation. Deformity of spine.  Neurological: Alert and oriented to person place and time. Skin: Skin is warm and dry. No rashes noted.  I spent 30 minutes total reviewing records, obtaining history, performing exam, counseling patient and documenting visit / findings.    Paula Guenther, NP  08/27/2020, 2:19 PM        

## 2020-08-27 NOTE — Patient Instructions (Signed)
If you are age 72 or older, your body mass index should be between 23-30. Your Body mass index is 24.76 kg/m. If this is out of the aforementioned range listed, please consider follow up with your Primary Care Provider.  If you are age 81 or younger, your body mass index should be between 19-25. Your Body mass index is 24.76 kg/m. If this is out of the aformentioned range listed, please consider follow up with your Primary Care Provider.   Please purchase the following medications over the counter and take as directed:  START: SalonPas apply patches to affected area for 7 days.  START: Advil 400mg  three times daily for 7 days.  Please call Beth in 7-10 days to update [place] on how you are feeling.  We have sent the following medications to your pharmacy for you to pick up at your convenience:  Omeprazole Pepcid  Thank you for entrusting me with your care and choosing [facility].  Tye Savoy, NP

## 2020-08-28 ENCOUNTER — Other Ambulatory Visit: Payer: Self-pay | Admitting: Hematology and Oncology

## 2020-08-31 NOTE — Progress Notes (Signed)
Addendum: Reviewed and agree with assessment and management plan. Ariah Mower M, MD  

## 2020-09-02 NOTE — Progress Notes (Signed)
HEMATOLOGY-ONCOLOGY MYCHART VIDEO VISIT PROGRESS NOTE  I connected with Cynthia Thornton on 09/03/2020 at  9:30 AM EDT by MyChart video conference and verified that I am speaking with the correct person using two identifiers.  I discussed the limitations, risks, security and privacy concerns of performing an evaluation and management service by MyChart and the availability of in person appointments.  I also discussed with the patient that there may be a patient responsible charge related to this service. The patient expressed understanding and agreed to proceed.  Patient's Location: Home Physician Location: Clinic  CHIEF COMPLIANT: Follow-up of left breast cancer on letrozole  INTERVAL HISTORY: Cynthia Thornton is a 74 y.o. female with above-mentioned history of left breast cancer treated with lumpectomy,radiation,and whois currently on antiestrogen therapy with letrozole.  She presents todayfor annual follow-up.  She experiences lots of depression on letrozole therapy.  She cries all the time.    Oncology History  Malignant neoplasm of lower-inner quadrant of left breast in female, estrogen receptor positive ([facility])  09/26/2017 Initial Diagnosis   Screening detected left breast calcifications 1.1 cm span axilla negative, biopsy revealed grade 2 invasive ductal carcinoma with DCIS ER 100% PR 2%, HER-2 negative ratio 0.78, Ki-67 20%, T1CN0 stage Ia clinical stage AJCC 8   11/02/2017 Surgery   Left Lumpectomy: IDC grade 2, 1.2 cm, 0/3 LN Neg, , ER 100% PR 2%, HER-2 negative ratio 0.78, Ki-67 20% T1c N0 Stage 1A   12/19/2017 - 01/09/2018 Radiation Therapy   Adjuvant radiation therapy   01/2018 - 04/05/2018 Anti-estrogen oral therapy   Letrozole daily stopped because of depression and hot flashes and myalgias     Observations/Objective:  There were no vitals filed for this visit. There is no height or weight on file to calculate BMI.  I have reviewed the data as listed CMP Latest Ref Rng & Units  08/13/2020 05/19/2020 05/18/2020  Glucose 65 - 99 mg/dL 84 94 133(H)  BUN 7 - 25 mg/dL 19 14 14   Creatinine 0.60 - 0.93 mg/dL 0.93 1.03 0.78  Sodium 135 - 146 mmol/L 134(L) 136 138  Potassium 3.5 - 5.3 mmol/L 5.2 4.2 4.2  Chloride 98 - 110 mmol/L 100 102 100  CO2 20 - 32 mmol/L 26 26 23   Calcium 8.6 - 10.4 mg/dL 9.8 9.4 9.7  Total Protein 6.0 - 8.3 g/dL - 6.8 7.2  Total Bilirubin 0.2 - 1.2 mg/dL - 0.4 0.6  Alkaline Phos 39 - 117 U/L - 98 106  AST 0 - 37 U/L - 23 29  ALT 0 - 35 U/L - 23 28    Lab Results  Component Value Date   WBC 6.0 08/13/2020   HGB 13.0 08/13/2020   HCT 38.9 08/13/2020   MCV 90.3 08/13/2020   PLT 411 (H) 08/13/2020   NEUTROABS 3,570 08/13/2020      Assessment Plan:  Malignant neoplasm of lower-inner quadrant of left breast in female, estrogen receptor positive (Cynthia Thornton) 10/31/17: Left Lumpectomy: IDC grade 2, 1.2 cm, 0/3 LN Neg, , ER 100% PR 2%, HER-2 negative ratio 0.78, Ki-67 20% T1c N0 Stage 1A  Treatment summary: 1. Oncotype DX16: 4% risk of recurrence with 5 years of tamoxifen: Low risk 2. Adjuvant radiation therapy2/20/2019 to 01/11/2018 3. Adjuvant antiestrogen therapy: With letrozole 2.5 mg daily to started4/10/2017 stopped within a few months because of hot flashes and depression, restarted 08/06/2020 1 tablet every other day  She could not tolerate Effexor because of nausea and vomiting.   Letrozole toxicities:  Complains of depression-like symptoms.  I instructed her to reduce the dosage in half.  She will take 1 tablet every other day.   Breast cancer surveillance: 1.Mammograms will be done November 2020 2.Breast exam 08/06/2020: Benign  Bone density 04/20/2020: T score -3: Osteoporosis: This is on her forearm and therefore she is not on any bisphosphonates at this time.  This is being managed by her gynecologist.  Return to clinic in 6 months for follow-up   I discussed the assessment and treatment plan with the patient. The patient  was provided an opportunity to ask questions and all were answered. The patient agreed with the plan and demonstrated an understanding of the instructions. The patient was advised to call back or seek an in-person evaluation if the symptoms worsen or if the condition fails to improve as anticipated.   I provided 20 minutes of face-to-face MyChart video visit time during this encounter.    Rulon Eisenmenger, MD 09/03/2020   I, Molly Dorshimer, am acting as scribe for Nicholas Lose, MD.  I have reviewed the above documentation for accuracy and completeness, and I agree with the above.

## 2020-09-03 ENCOUNTER — Inpatient Hospital Stay (HOSPITAL_BASED_OUTPATIENT_CLINIC_OR_DEPARTMENT_OTHER): Payer: Medicare PPO | Admitting: Hematology and Oncology

## 2020-09-03 DIAGNOSIS — M542 Cervicalgia: Secondary | ICD-10-CM | POA: Diagnosis not present

## 2020-09-03 DIAGNOSIS — M5412 Radiculopathy, cervical region: Secondary | ICD-10-CM | POA: Diagnosis not present

## 2020-09-03 DIAGNOSIS — Z170 Estrogen receptor positive status [ER+]: Secondary | ICD-10-CM | POA: Diagnosis not present

## 2020-09-03 DIAGNOSIS — C50312 Malignant neoplasm of lower-inner quadrant of left female breast: Secondary | ICD-10-CM

## 2020-09-03 DIAGNOSIS — M170 Bilateral primary osteoarthritis of knee: Secondary | ICD-10-CM | POA: Diagnosis not present

## 2020-09-03 MED ORDER — LETROZOLE 2.5 MG PO TABS
2.5000 mg | ORAL_TABLET | Freq: Every day | ORAL | 2 refills | Status: DC
Start: 2020-09-03 — End: 2020-09-27

## 2020-09-03 NOTE — Assessment & Plan Note (Signed)
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 Oncotype DX16: 4% risk of recurrence with 5 years of tamoxifen: Low risk 2. Adjuvant radiation therapy2/20/2019 to 01/11/2018 3. Adjuvant antiestrogen therapy: With letrozole 2.5 mg daily to started4/10/2017 stopped within a few months because of hot flashes and depression, restarted 08/06/2020  She could not tolerate Effexor because of nausea and vomiting.   Letrozole:    Rash on the breast: It could be eczema.  It does not feel like a cutaneous recurrence of breast cancer.  I instructed her to get a biopsy of the skin at the dermatologists office.  Breast cancer surveillance: 1.Mammograms will be done November 2020 2.Breast exam 08/06/2020: Benign  Bone density 04/20/2020: T score -3: Osteoporosis: This is on her forearm and therefore she is not on any bisphosphonates at this time.  This is being managed by her gynecologist.  Return to clinic in 6 months for follow-up

## 2020-09-18 DIAGNOSIS — M542 Cervicalgia: Secondary | ICD-10-CM | POA: Diagnosis not present

## 2020-09-20 DIAGNOSIS — Z853 Personal history of malignant neoplasm of breast: Secondary | ICD-10-CM | POA: Diagnosis not present

## 2020-09-20 LAB — HM MAMMOGRAPHY

## 2020-09-21 ENCOUNTER — Encounter: Payer: Self-pay | Admitting: Internal Medicine

## 2020-09-21 DIAGNOSIS — M5412 Radiculopathy, cervical region: Secondary | ICD-10-CM | POA: Diagnosis not present

## 2020-09-25 ENCOUNTER — Other Ambulatory Visit: Payer: Self-pay | Admitting: Hematology and Oncology

## 2020-10-11 DIAGNOSIS — C50312 Malignant neoplasm of lower-inner quadrant of left female breast: Secondary | ICD-10-CM | POA: Diagnosis not present

## 2020-11-05 DIAGNOSIS — N3020 Other chronic cystitis without hematuria: Secondary | ICD-10-CM | POA: Diagnosis not present

## 2020-11-05 DIAGNOSIS — N3000 Acute cystitis without hematuria: Secondary | ICD-10-CM | POA: Diagnosis not present

## 2020-12-22 ENCOUNTER — Other Ambulatory Visit: Payer: Self-pay | Admitting: *Deleted

## 2020-12-22 MED ORDER — LETROZOLE 2.5 MG PO TABS
2.5000 mg | ORAL_TABLET | Freq: Every day | ORAL | 1 refills | Status: DC
Start: 2020-12-22 — End: 2021-03-09

## 2020-12-31 ENCOUNTER — Encounter: Payer: Medicare PPO | Admitting: Internal Medicine

## 2021-01-06 ENCOUNTER — Ambulatory Visit (INDEPENDENT_AMBULATORY_CARE_PROVIDER_SITE_OTHER): Payer: Medicare PPO

## 2021-01-06 ENCOUNTER — Other Ambulatory Visit: Payer: Self-pay

## 2021-01-06 ENCOUNTER — Ambulatory Visit: Payer: Medicare PPO

## 2021-01-06 VITALS — BP 110/70 | HR 66 | Temp 98.1°F | Resp 16 | Ht 64.0 in | Wt 143.2 lb

## 2021-01-06 DIAGNOSIS — Z0000 Encounter for general adult medical examination without abnormal findings: Secondary | ICD-10-CM | POA: Diagnosis not present

## 2021-01-06 NOTE — Progress Notes (Signed)
Subjective:   Cynthia Thornton is a 75 y.o. female who presents for Medicare Annual (Subsequent) preventive examination.  Review of Systems     Cardiac Risk Factors include: advanced age (>15mn, >>23women);dyslipidemia     Objective:    Today's Vitals   01/06/21 1516  BP: 110/70  Pulse: 66  Resp: 16  Temp: 98.1 F (36.7 C)  TempSrc: Oral  SpO2: 98%  Weight: 143 lb 3.2 oz (65 kg)  Height: _0  (1.626 m)   Body mass index is 24.58 kg/m.  Advanced Directives 01/06/2021 04/23/2020 01/02/2020 10/13/2019 09/10/2019 12/16/2018 02/14/2018  Does Patient Have a Medical Advance Directive? Yes Yes No Yes No Yes Yes  Type of AParamed[place]of [place]Living will [place]North Ball[place]Living will - [place]Living will [place]ad[place]Living will  Does patient want to make changes to medical advance directive? - No - Patient declined - No - Patient declined - No - Patient declined -  Copy of HPetalin Chart? No - copy requested No - copy requested - No - copy requested - No - copy requested -  Would patient like information on creating a medical advance directive? - - No - Patient declined - No - Patient declined - -    Current Medications (verified) Outpatient Encounter Medications as of 01/06/2021  Medication Sig  . aspirin EC 81 MG tablet Take 81 mg by mouth daily.  . B Complex-C (B-COMPLEX WITH VITAMIN C) tablet Take 1 tablet by mouth daily.  . cetirizine (ZYRTEC) 10 MG tablet Take 10 mg by mouth daily.  . Cholecalciferol (VITAMIN D) 2000 units CAPS Take 2,000 Units by mouth daily.  . clorazepate (TRANXENE) 7.5 MG tablet Take 1 tablet (7.5 mg total) by mouth at bedtime as needed for anxiety.  .Marland KitchenCRANBERRY PO Take 2 tablets by mouth in the morning and at bedtime.  . famotidine (PEPCID) 20 MG tablet Take 1 tablet (20 mg total) by mouth daily.  .Marland Kitchenletrozole (FEMARA) 2.5 MG tablet  Take 1 tablet (2.5 mg total) by mouth daily.  . methenamine (HIPREX) 1 g tablet Take 1 g by mouth 2 (two) times daily with a meal. Take with 5054mof vitamin c w/ each tablet  . omeprazole (PRILOSEC) 40 MG capsule TAKE 1 CAPSULE BY MOUTH EVERY DAY  . Probiotic Product (PROBIOTIC PO) Take by mouth.  . promethazine (PHENERGAN) 25 MG suppository Place 1 suppository (25 mg total) rectally every 12 (twelve) hours as needed for nausea or vomiting. (Patient not taking: No sig reported)   No facility-administered encounter medications on file as of 01/06/2021.    Allergies (verified) Doxycycline, Other, Oxycodone, Sulfa antibiotics, Trimethoprim sulfate [trimethoprim], Codeine, and Propranolol hcl   History: Past Medical History:  Diagnosis Date  . Arthritis   . [facility]2017--- IDC, Stage IA, Grade 2, ER/PR +;  HER2 negative;  s/p  breast lumpectomy w/ node dissection 11-02-2017;  completed radiation 01-09-2018 (left breast genetic screening panel 2017 with variant of unknown significance AXINA)  . COPD (chronic obstructive pulmonary disease) ([place]   [place]ulmologist-- dr raDillard [place] w/ chronic lingular scarring  (last exceratbation bronchiectasis 07/ 2020)  . Dyslipidemia    followed by cardiology/ vascular-- dr g. adams ([facility] [place]  family history strokes  . Eczema   . Family history of breast cancer   .  Family history of colon cancer   . GERD (gastroesophageal reflux disease)   . Hiatal hernia   . History of esophageal stricture    s/p dilatation 2008  . History of external beam radiation therapy    left breast 12-19-2017  to 01-09-2018  . History of hyperthyroidism    endocrinologist-- (lov 04-11-2019 epic) dr Loanne Drilling, dx 2009 due to multinodular goiter ,  had taken medication for few months 2010 stopped due to norma TFT;    . Insomnia   . Multinodular goiter last thyroid ultrasound in epic 12-18-2018    endocrinologist-- dr Loanne Drilling,  dx 2009 w/ hyperthroidism with medication until normal TFT in 2010;    . Osteopenia 02/2018   T score -2.4 distal third of radius.  FRAX 11% / 1.8% stable at other points of interest to include spine, right and left hip  . Personal history of colonic polyps 09/06/2001   hyperplastic  . Pityriasis rosea   . PONV (postoperative nausea and vomiting)    severe  . Primary osteoarthritis of left knee    Severe Patellofemoral arthritis  . 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 Wears contact lenses    Past Surgical History:  Procedure Laterality Date  . BREAST LUMPECTOMY WITH RADIOACTIVE SEED AND SENTINEL LYMPH NODE BIOPSY Left 11/02/2017   Procedure: LEFT BREAST LUMPECTOMY WITH RADIOACTIVE SEED AND LEFT AXILLARY DEEP SENTINEL LYMPH NODE BIOPSY, INJECT BLUE DYE LEFT BREAST;  Surgeon: [name], MD;  Location: [place];  Service: General;  Laterality: Left;  . BREAST SURGERY  2000   Breast cyst removed, left  . LAPAROSCOPIC BILATERAL SALPINGO OOPHERECTOMY Bilateral 10/13/2019   Procedure: LAPAROSCOPIC BILATERAL SALPINGO OOPHORECTOMY LYSIS OF ADHESIONS PERITONEAL WASHINGS ;  Surgeon: [name], MD;  Location: [place];  Service: Gynecology;  Laterality: Bilateral;  request 7:30am OR time in [place] [facility] block requests one hour  . TONSILLECTOMY  age 26  . TUBAL LIGATION Bilateral yrs ago  . VAGINAL HYSTERECTOMY  1991   Right ovarian cystectomy  . VIDEO BRONCHOSCOPY Bilateral 03/16/2015   Procedure: VIDEO BRONCHOSCOPY WITHOUT FLUORO;  Surgeon: Brand Males, MD;  [facility];  Service: Endoscopy;  Laterality: Bilateral;   Family History  Problem Relation Age of Onset  . Osteoporosis Mother   . Lung cancer Mother   . Stroke Mother   . Diabetes Father   . Hypertension Father   . Osteoporosis Father   . Colon cancer Father 58  . Breast cancer Sister         Age 45  . Ovarian cancer Maternal Aunt 60  . Breast cancer Cousin        maternal-Age 49  . Colon cancer Paternal Uncle 30  . Colon cancer Cousin        maternal first cousin  . Breast cancer Maternal Aunt        great aunt- Age unknown  . Crohn's disease Son   . Leukemia Maternal Grandfather    Social History   Socioeconomic History  . Marital status: Single    Spouse name: Not on file  . Number of children: 2  . Years of education: Not on file  . Highest education level: Not on file  Occupational History  . Occupation: Pharmacist, hospital, works part Financial trader: [employer]  Tobacco Use  . Smoking status: Former Smoker    Packs/day: 0.10    Years: 30.00  Pack years: 3.00    Quit date: 10/31/1995    Years since quitting: 25.2  . Smokeless tobacco: Never Used  Vaping Use  . Vaping Use: Never used  Substance and Sexual Activity  . Alcohol use: Yes    Alcohol/week: 0.0 standard drinks    Comment: seldom  . Drug use: No  . Sexual activity: Not Currently    Birth control/protection: Surgical    Comment: 1st intercourse 75 yo-Fewer than 5 partners  Other Topics Concern  . Not on file  Social History Narrative   Lives by herself   2 children, one has mental issues    Social Determinants of Health   Financial Resource Strain: Low Risk   . Difficulty of Paying Living Expenses: Not hard at all  Food Insecurity: No Food Insecurity  . Worried About Charity fundraiser in the Last Year: Never true  . Ran Out of Food in the Last Year: Never true  Transportation Needs: No Transportation Needs  . Lack of Transportation (Medical): No  . Lack of Transportation (Non-Medical): No  Physical Activity: Inactive  . Days of Exercise per Week: 0 days  . Minutes of Exercise per Session: 0 min  Stress: No Stress Concern Present  . Feeling of Stress : Not at all  Social Connections: Moderately Integrated  . Frequency of Communication with Friends and Family: More than three  times a week  . Frequency of Social Gatherings with Friends and Family: More than three times a week  . Attends Religious Services: More than 4 times per year  . Active Member of Clubs or Organizations: Yes  . Attends Archivist Meetings: More than 4 times per year  . Marital Status: Never married    Tobacco Counseling Counseling given: Not Answered   Clinical Intake:  Pre-visit preparation completed: Yes  Pain : No/denies pain     Nutritional Status: BMI of 19-24  Normal Nutritional Risks: None Diabetes: No  How often do you need to have someone help you when you read instructions, pamphlets, or other written materials from your doctor or pharmacy?: 1 - Never  Diabetic?No  Interpreter Needed?: No  Information entered by :: Caroleen Hamman LPN   Activities of Daily Living In your present state of health, do you have any difficulty performing the following activities: 01/06/2021 04/23/2020  Hearing? N N  Vision? N N  Difficulty concentrating or making decisions? N N  Walking or climbing stairs? N Y  Dressing or bathing? N N  Doing errands, shopping? N N  Preparing Food and eating ? N -  Using the Toilet? N -  In the past six months, have you accidently leaked urine? N -  Do you have problems with loss of bowel control? N -  Managing your Medications? N -  Managing your Finances? N -  Housekeeping or managing your Housekeeping? N -  Some recent data might be hidden    Patient Care Team: Colon Branch, MD as PCP - Gaston Islam, MD as Consulting Physician (Orthopedic Surgery) Nicholas Lose, MD as Consulting Physician (Hematology and Oncology) [name], MD as Consulting Physician (General Surgery) Fontaine, Belinda Block, MD (Inactive) as Consulting Physician (Gynecology) Brand Males, MD as Consulting Physician (Pulmonary Disease) Kyung Rudd, MD as Consulting Physician (Radiation Oncology) Delice Bison, Charlestine Massed, NP as Nurse  Practitioner (Hematology and Oncology) Ardis Hughs, MD as Attending Physician (Urology) Melida Quitter, MD as Consulting Physician (Otolaryngology)  Indicate any recent Medical Services you  may have received from other than [facility] providers in the past year (date may be approximate).     Assessment:   This is a routine wellness examination for Ayrianna.  Hearing/Vision screen  Hearing Screening   _0  _1  _2  _3  _4  _5  _6  _7  _8   Right ear:           Left ear:           Comments: No hearing loss  Vision Screening Comments: Wears reading glasses & contacts Last eye exam-2021-Dr.McCuen  Dietary issues and exercise activities discussed: Current Exercise Habits: The patient does not participate in regular exercise at present, Exercise limited by: None identified  Goals      Patient Stated   .  Begin exercising (pt-stated)      Other   .  Continue working!      Depression Screen PHQ 2/9 Scores 01/06/2021 01/02/2020 12/26/2019 12/16/2018 02/14/2018 12/14/2017 11/28/2017  PHQ - 2 Score 0 0 0 0 0 0 0    Fall Risk Fall Risk  01/06/2021 01/02/2020 12/26/2019 12/16/2018 02/14/2018  Falls in the past year? 1 0 0 0 No  Number falls in past yr: 0 0 0 - -  Injury with Fall? 0 0 0 - -  Risk for fall due to : History of fall(s) - - - -  Follow up Falls prevention discussed Education provided;Falls prevention discussed Falls evaluation completed - -    FALL RISK PREVENTION PERTAINING TO THE HOME:  Any stairs in or around the home? Yes  If so, are there any without handrails? No  Home free of loose throw rugs in walkways, pet beds, electrical cords, etc? Yes  Adequate lighting in your home to reduce risk of falls? Yes   ASSISTIVE DEVICES UTILIZED TO PREVENT FALLS:  Life alert? No  Use of a cane, walker or w/c? No  Grab bars in the bathroom? Yes  Shower chair or bench in shower? No  Elevated toilet seat or a handicapped toilet? No   TIMED UP AND GO:  Was the  test performed? Yes .  Length of time to ambulate 10 feet: 10 sec.   Gait steady and fast without use of assistive device  Cognitive Function:Normal cognitive status assessed by direct observation by this Nurse Health Advisor. No abnormalities found.   MMSE - Mini Mental State Exam 12/08/2016  Orientation to time 5  Orientation to Place 5  Registration 3  Attention/ Calculation 5  Recall 3  Language- name 2 objects 2  Language- repeat 1  Language- follow 3 step command 3  Language- read & follow direction 1  Write a sentence 1  Copy design 1  Total score 30        Immunizations Immunization History  Administered Date(s) Administered  . Influenza Split 08/31/2011, 07/30/2014  . Influenza Whole 08/30/2009, 07/30/2010  . Influenza, High Dose Seasonal PF 07/05/2018, 08/02/2019  . Influenza,inj,Quad PF,6+ Mos 09/30/2012, 10/10/2013, 07/26/2015  . Influenza-Unspecified 08/18/2016, 07/30/2020  . Moderna Sars-Covid-2 Vaccination 11/29/2019, 12/29/2019, 10/14/2020  . Pneumococcal Conjugate-13 10/10/2013  . Pneumococcal Polysaccharide-23 07/27/2015  . Td 12/02/2008  . Tdap 01/03/2019  . Zoster 12/02/2008  . Zoster Recombinat (Shingrix) 01/03/2019, 04/18/2019    TDAP status: Up to date  Flu Vaccine status: Up to date  Pneumococcal vaccine status: Up to date  Covid-19 vaccine status: Completed vaccines  Qualifies for Shingles Vaccine? No   Zostavax completed Yes   Shingrix Completed?: Yes  Screening Tests Health Maintenance  Topic Date Due  .  COVID-19 Vaccine (4 - Booster for Moderna series) 04/14/2021  . MAMMOGRAM  09/20/2021  . COLONOSCOPY (Pts 45-35yr Insurance coverage will need to be confirmed)  05/28/2024  . TETANUS/TDAP  01/02/2029  . INFLUENZA VACCINE  Completed  . DEXA SCAN  Completed  . Hepatitis C Screening  Completed  . PNA vac Low Risk Adult  Completed  . HPV VACCINES  Aged Out    Health Maintenance  There are no preventive care reminders to  display for this patient.  Colorectal cancer screening: Type of screening: Colonoscopy. Completed 05/29/2019. Repeat every 5 years  Mammogram status: Completed Bilateral 09/20/2020. Repeat every year  Bone Density status: Completed 05/25/2020. Results reflect: Bone density results: OSTEOPOROSIS. Repeat every 2 years.  Lung Cancer Screening: (Low Dose CT Chest recommended if Age 73-80years, 30 pack-year currently smoking OR have quit w/in 15years.) does not qualify.    Additional Screening:  Hepatitis C Screening:Completed 07/27/2015  Vision Screening: Recommended annual ophthalmology exams for early detection of glaucoma and other disorders of the eye. Is the patient up to date with their annual eye exam?  Yes  Who is the provider or what is the name of the office in which the patient attends annual eye exams? Dr. MEllie Lunch  Dental Screening: Recommended annual dental exams for proper oral hygiene  Community Resource Referral / Chronic Care Management: CRR required this visit?  No   CCM required this visit?  No      Plan:     I have personally reviewed and noted the following in the patient's chart:   . Medical and social history . Use of alcohol, tobacco or illicit drugs  . Current medications and supplements . Functional ability and status . Nutritional status . Physical activity . Advanced directives . List of other physicians . Hospitalizations, surgeries, and ER visits in previous 12 months . Vitals . Screenings to include cognitive, depression, and falls . Referrals and appointments  In addition, I have reviewed and discussed with patient certain preventive protocols, quality metrics, and best practice recommendations. A written personalized care plan for preventive services as well as general preventive health recommendations were provided to patient.   Patient to access avs on mychart.  MMarta Antu LPN   [id number] Nurse health Advisor  Nurse Notes:  None

## 2021-01-06 NOTE — Patient Instructions (Signed)
[name] , Thank you for taking time to come for your Medicare Wellness Visit. I appreciate your ongoing commitment to your health goals. Please review the following plan we discussed and let me know if I can assist you in the future.   Screening recommendations/referrals: Colonoscopy: Completed 05/29/2019-Due 05/28/2024  Mammogram: Completed 09/20/2020-Due 09/20/2021 Bone Density: Completed 05/25/2020-Due 05/25/2022 Recommended yearly ophthalmology/optometry visit for glaucoma screening and checkup Recommended yearly dental visit for hygiene and checkup  Vaccinations: Influenza vaccine: Up to date Pneumococcal vaccine: Completed vaccines Tdap vaccine: Up to date-Due-01/02/2029 Shingles vaccine: Completed vaccines   Covid-19:Completed vaccines  Advanced directives: Please bring a copy for your chart  Conditions/risks identified: See problem list  Next appointment: Follow up in one year for your annual wellness visit    Preventive Care 65 Years and Older, Female Preventive care refers to lifestyle choices and visits with your health care provider that can promote health and wellness. What does preventive care include?  A yearly physical exam. This is also called an annual well check.  Dental exams once or twice a year.  Routine eye exams. Ask your health care provider how often you should have your eyes checked.  Personal lifestyle choices, including:  Daily care of your teeth and gums.  Regular physical activity.  Eating a healthy diet.  Avoiding tobacco and drug use.  Limiting alcohol use.  Practicing safe sex.  Taking low-dose aspirin every day.  Taking vitamin and mineral supplements as recommended by your health care provider. What happens during an annual well check? The services and screenings done by your health care provider during your annual well check will depend on your age, overall health, lifestyle risk factors, and family history of disease. Counseling   Your health care provider may ask you questions about your:  Alcohol use.  Tobacco use.  Drug use.  Emotional well-being.  Home and relationship well-being.  Sexual activity.  Eating habits.  History of falls.  Memory and ability to understand (cognition).  Work and work Statistician.  Reproductive health. Screening  You may have the following tests or measurements:  Height, weight, and BMI.  Blood pressure.  Lipid and cholesterol levels. These may be checked every 5 years, or more frequently if you are over 19 years old.  Skin check.  Lung cancer screening. You may have this screening every year starting at age 89 if you have a 30-pack-year history of smoking and currently smoke or have quit within the past 15 years.  Fecal occult blood test (FOBT) of the stool. You may have this test every year starting at age 76.  Flexible sigmoidoscopy or colonoscopy. You may have a sigmoidoscopy every 5 years or a colonoscopy every 10 years starting at age 28.  Hepatitis C blood test.  Hepatitis B blood test.  Sexually transmitted disease (STD) testing.  Diabetes screening. This is done by checking your blood sugar (glucose) after you have not eaten for a while (fasting). You may have this done every 1-3 years.  Bone density scan. This is done to screen for osteoporosis. You may have this done starting at age 35.  Mammogram. This may be done every 1-2 years. Talk to your health care provider about how often you should have regular mammograms. Talk with your health care provider about your test results, treatment options, and if necessary, the need for more tests. Vaccines  Your health care provider may recommend certain vaccines, such as:  Influenza vaccine. This is recommended every year.  Tetanus,  diphtheria, and acellular pertussis (Tdap, Td) vaccine. You may need a Td booster every 10 years.  Zoster vaccine. You may need this after age 75.  Pneumococcal 13-valent  conjugate (PCV13) vaccine. One dose is recommended after age 67.  Pneumococcal polysaccharide (PPSV23) vaccine. One dose is recommended after age 58. Talk to your health care provider about which screenings and vaccines you need and how often you need them. This information is not intended to replace advice given to you by your health care provider. Make sure you discuss any questions you have with your health care provider. Document Released: 11/12/2015 Document Revised: 07/05/2016 Document Reviewed: 08/17/2015 Elsevier Interactive Patient Education  2017 [place] Prevention in the Home Falls can cause injuries. They can happen to people of all ages. There are many things you can do to make your home safe and to help prevent falls. What can I do on the outside of my home?  Regularly fix the edges of walkways and driveways and fix any cracks.  Remove anything that might make you trip as you walk through a door, such as a raised step or threshold.  Trim any bushes or trees on the path to your home.  Use bright outdoor lighting.  Clear any walking paths of anything that might make someone trip, such as rocks or tools.  Regularly check to see if handrails are loose or broken. Make sure that both sides of any steps have handrails.  Any raised decks and porches should have guardrails on the edges.  Have any leaves, snow, or ice cleared regularly.  Use sand or salt on walking paths during winter.  Clean up any spills in your garage right away. This includes oil or grease spills. What can I do in the bathroom?  Use night lights.  Install grab bars by the toilet and in the tub and shower. Do not use towel bars as grab bars.  Use non-skid mats or decals in the tub or shower.  If you need to sit down in the shower, use a plastic, non-slip stool.  Keep the floor dry. Clean up any water that spills on the floor as soon as it happens.  Remove soap buildup in the tub or  shower regularly.  Attach bath mats securely with double-sided non-slip rug tape.  Do not have throw rugs and other things on the floor that can make you trip. What can I do in the bedroom?  Use night lights.  Make sure that you have a light by your bed that is easy to reach.  Do not use any sheets or blankets that are too big for your bed. They should not hang down onto the floor.  Have a firm chair that has side arms. You can use this for support while you get dressed.  Do not have throw rugs and other things on the floor that can make you trip. What can I do in the kitchen?  Clean up any spills right away.  Avoid walking on wet floors.  Keep items that you use a lot in easy-to-reach places.  If you need to reach something above you, use a strong step stool that has a grab bar.  Keep electrical cords out of the way.  Do not use floor polish or wax that makes floors slippery. If you must use wax, use non-skid floor wax.  Do not have throw rugs and other things on the floor that can make you trip. What can I do with  my stairs?  Do not leave any items on the stairs.  Make sure that there are handrails on both sides of the stairs and use them. Fix handrails that are broken or loose. Make sure that handrails are as long as the stairways.  Check any carpeting to make sure that it is firmly attached to the stairs. Fix any carpet that is loose or worn.  Avoid having throw rugs at the top or bottom of the stairs. If you do have throw rugs, attach them to the floor with carpet tape.  Make sure that you have a light switch at the top of the stairs and the bottom of the stairs. If you do not have them, ask someone to add them for you. What else can I do to help prevent falls?  Wear shoes that:  Do not have high heels.  Have rubber bottoms.  Are comfortable and fit you well.  Are closed at the toe. Do not wear sandals.  If you use a stepladder:  Make sure that it is fully  opened. Do not climb a closed stepladder.  Make sure that both sides of the stepladder are locked into place.  Ask someone to hold it for you, if possible.  Clearly mark and make sure that you can see:  Any grab bars or handrails.  First and last steps.  Where the edge of each step is.  Use tools that help you move around (mobility aids) if they are needed. These include:  Canes.  Walkers.  Scooters.  Crutches.  Turn on the lights when you go into a dark area. Replace any light bulbs as soon as they burn out.  Set up your furniture so you have a clear path. Avoid moving your furniture around.  If any of your floors are uneven, fix them.  If there are any pets around you, be aware of where they are.  Review your medicines with your doctor. Some medicines can make you feel dizzy. This can increase your chance of falling. Ask your doctor what other things that you can do to help prevent falls. This information is not intended to replace advice given to you by your health care provider. Make sure you discuss any questions you have with your health care provider. Document Released: 08/12/2009 Document Revised: 03/23/2016 Document Reviewed: 11/20/2014 Elsevier Interactive Patient Education  2017 [employer].

## 2021-01-07 ENCOUNTER — Encounter: Payer: Self-pay | Admitting: Internal Medicine

## 2021-01-07 ENCOUNTER — Ambulatory Visit: Payer: Medicare PPO | Admitting: *Deleted

## 2021-01-07 ENCOUNTER — Ambulatory Visit (INDEPENDENT_AMBULATORY_CARE_PROVIDER_SITE_OTHER): Payer: Medicare PPO | Admitting: Internal Medicine

## 2021-01-07 VITALS — BP 128/70 | HR 67 | Temp 98.1°F | Resp 16 | Ht 63.5 in | Wt 143.2 lb

## 2021-01-07 DIAGNOSIS — D649 Anemia, unspecified: Secondary | ICD-10-CM

## 2021-01-07 DIAGNOSIS — Z0000 Encounter for general adult medical examination without abnormal findings: Secondary | ICD-10-CM | POA: Diagnosis not present

## 2021-01-07 DIAGNOSIS — E7800 Pure hypercholesterolemia, unspecified: Secondary | ICD-10-CM

## 2021-01-07 LAB — COMPREHENSIVE METABOLIC PANEL
ALT: 14 U/L (ref 0–35)
AST: 16 U/L (ref 0–37)
Albumin: 4.2 g/dL (ref 3.5–5.2)
Alkaline Phosphatase: 83 U/L (ref 39–117)
BUN: 21 mg/dL (ref 6–23)
CO2: 27 mEq/L (ref 19–32)
Calcium: 9.2 mg/dL (ref 8.4–10.5)
Chloride: 99 mEq/L (ref 96–112)
Creatinine, Ser: 0.95 mg/dL (ref 0.40–1.20)
GFR: 59.74 mL/min — ABNORMAL LOW (ref >60.00)
Glucose, Bld: 85 mg/dL (ref 70–99)
Potassium: 4.9 mEq/L (ref 3.5–5.1)
Sodium: 134 mEq/L — ABNORMAL LOW (ref 135–145)
Total Bilirubin: 0.4 mg/dL (ref 0.2–1.2)
Total Protein: 6.4 g/dL (ref 6.0–8.3)

## 2021-01-07 LAB — CBC WITH DIFFERENTIAL/PLATELET
Basophils Absolute: 0.1 10*3/uL (ref 0.0–0.1)
Basophils Relative: 0.9 % (ref 0.0–3.0)
Eosinophils Absolute: 0.2 10*3/uL (ref 0.0–0.7)
Eosinophils Relative: 3.1 % (ref 0.0–5.0)
HCT: 35.2 % — ABNORMAL LOW (ref 36.0–46.0)
Hemoglobin: 12.2 g/dL (ref 12.0–15.0)
Lymphocytes Relative: 29.5 % (ref 12.0–46.0)
Lymphs Abs: 1.7 10*3/uL (ref 0.7–4.0)
MCHC: 34.6 g/dL (ref 30.0–36.0)
MCV: 90.6 fl (ref 78.0–100.0)
Monocytes Absolute: 0.6 10*3/uL (ref 0.1–1.0)
Monocytes Relative: 10.3 % (ref 3.0–12.0)
Neutro Abs: 3.2 10*3/uL (ref 1.4–7.7)
Neutrophils Relative %: 56.2 % (ref 43.0–77.0)
Platelets: 258 10*3/uL (ref 150.0–400.0)
RBC: 3.89 Mil/uL (ref 3.87–5.11)
RDW: 13.1 % (ref 11.5–15.5)
WBC: 5.7 10*3/uL (ref 4.0–10.5)

## 2021-01-07 LAB — LIPID PANEL
Cholesterol: 175 mg/dL (ref 0–200)
HDL: 86.8 mg/dL (ref >39.00)
LDL Cholesterol: 69 mg/dL (ref 0–99)
NonHDL: 88.09
Total CHOL/HDL Ratio: 2
Triglycerides: 94 mg/dL (ref 0.0–149.0)
VLDL: 18.8 mg/dL (ref 0.0–40.0)

## 2021-01-07 LAB — TSH: TSH: 3 u[IU]/mL (ref 0.35–4.50)

## 2021-01-07 LAB — IRON: Iron: 59 ug/dL (ref 42–145)

## 2021-01-07 LAB — FERRITIN: Ferritin: 16.8 ng/mL (ref 10.0–291.0)

## 2021-01-07 NOTE — Patient Instructions (Addendum)
Please bring [place] a copy of your Healthcare Power of Attorney and Living Will. This is for your chart.     GO TO THE LAB : Get the blood work     [place], [place] Come back for a physical exam in 1 year   Fall Prevention in the Home, Adult Falls can cause injuries and can affect people from all age groups. There are many simple things that you can do to make your home safe and to help prevent falls. Ask for help when making these changes, if needed. What actions can I take to prevent falls? General instructions  Use good lighting in all rooms. Replace any light bulbs that burn out.  Turn on lights if it is dark. Use night-lights.  Place frequently used items in easy-to-reach places. Lower the shelves around your home if necessary.  Set up furniture so that there are clear paths around it. Avoid moving your furniture around.  Remove throw rugs and other tripping hazards from the floor.  Avoid walking on wet floors.  Fix any uneven floor surfaces.  Add color or contrast paint or tape to grab bars and handrails in your home. Place contrasting color strips on the first and last steps of stairways.  When you use a stepladder, make sure that it is completely opened and that the sides are firmly locked. Have someone hold the ladder while you are using it. Do not climb a closed stepladder.  Be aware of any and all pets. What can I do in the bathroom?  Keep the floor dry. Immediately clean up any water that spills onto the floor.  Remove soap buildup in the tub or shower on a regular basis.  Use non-skid mats or decals on the floor of the tub or shower.  Attach bath mats securely with double-sided, non-slip rug tape.  If you need to sit down while you are in the shower, use a plastic, non-slip stool.  Install grab bars by the toilet and in the tub and shower. Do not use towel bars as grab bars.      What can I do in the bedroom?  Make  sure that a bedside light is easy to reach.  Do not use oversized bedding that drapes onto the floor.  Have a firm chair that has side arms to use for getting dressed. What can I do in the kitchen?  Clean up any spills right away.  If you need to reach for something above you, use a sturdy step stool that has a grab bar.  Keep electrical cables out of the way.  Do not use floor polish or wax that makes floors slippery. If you must use wax, make sure that it is non-skid floor wax. What can I do in the stairways?  Do not leave any items on the stairs.  Make sure that you have a light switch at the top of the stairs and the bottom of the stairs. Have them installed if you do not have them.  Make sure that there are handrails on both sides of the stairs. Fix handrails that are broken or loose. Make sure that handrails are as long as the stairways.  Install non-slip stair treads on all stairs in your home.  Avoid having throw rugs at the top or bottom of stairways, or secure the rugs with carpet tape to prevent them from moving.  Choose a carpet design that does not hide the edge  of steps on the stairway.  Check any carpeting to make sure that it is firmly attached to the stairs. Fix any carpet that is loose or worn. What can I do on the outside of my home?  Use bright outdoor lighting.  Regularly repair the edges of walkways and driveways and fix any cracks.  Remove high doorway thresholds.  Trim any shrubbery on the main path into your home.  Regularly check that handrails are securely fastened and in good repair. Both sides of any steps should have handrails.  Install guardrails along the edges of any raised decks or porches.  Clear walkways of debris and clutter, including tools and rocks.  Have leaves, snow, and ice cleared regularly.  Use sand or salt on walkways during winter months.  In the garage, clean up any spills right away, including grease or oil  spills. What other actions can I take?  Wear closed-toe shoes that fit well and support your feet. Wear shoes that have rubber soles or low heels.  Use mobility aids as needed, such as canes, walkers, scooters, and crutches.  Review your medicines with your health care provider. Some medicines can cause dizziness or changes in blood pressure, which increase your risk of falling. Talk with your health care provider about other ways that you can decrease your risk of falls. This may include working with a physical therapist or trainer to improve your strength, balance, and endurance. Where to find more information  Centers for Disease Control and Prevention, STEADI: WebmailGuide.co.za  [employer] on Aging: BrainJudge.co.uk Contact a health care provider if:  You are afraid of falling at home.  You feel weak, drowsy, or dizzy at home.  You fall at home. Summary  There are many simple things that you can do to make your home safe and to help prevent falls.  Ways to make your home safe include removing tripping hazards and installing grab bars in the bathroom.  Ask for help when making these changes in your home. This information is not intended to replace advice given to you by your health care provider. Make sure you discuss any questions you have with your health care provider. Document Revised: 09/28/2017 Document Reviewed: 05/31/2017 Elsevier Patient Education  2021 [employer].

## 2021-01-07 NOTE — Progress Notes (Addendum)
Subjective:    Patient ID: Cynthia Thornton, female    DOB: 16-Jul-1946, 75 y.o.   MRN: [id number]  DOS:  01/07/2021 Type of visit - description: CPX  In general feeling well.  Has no major concerns. History of DJD, has aches and pains, managing well. Currently with no heartburn.   Review of Systems  Other than above, a 14 point review of systems is negative      Past Medical History:  Diagnosis Date   Arthritis    Breast cancer, left [facility]) oncologist-- dr [name]   dx 09-26-2017--- IDC, Stage IA, Grade 2, ER/PR +;  HER2 negative;  s/p  breast lumpectomy w/ node dissection 11-02-2017;  completed radiation 01-09-2018 (left breast genetic screening panel 2017 with variant of unknown significance AXINA)   COPD (chronic obstructive pulmonary disease) ([place])    pulmologist-- dr Dillard Essex-- w/ chronic lingular scarring  (last exceratbation bronchiectasis 07/ 2020)   Dyslipidemia    followed by cardiology/ vascular-- dr g. adams ([facility] in [place])  family history strokes   Eczema    Family history of breast cancer    Family history of colon cancer    GERD (gastroesophageal reflux disease)    Hiatal hernia    History of esophageal stricture    s/p dilatation 2008   History of external beam radiation therapy    left breast 12-19-2017  to 01-09-2018   History of hyperthyroidism    endocrinologist-- (lov 04-11-2019 epic) dr Loanne Drilling, dx 2009 due to multinodular goiter ,  had taken medication for few months 2010 stopped due to norma TFT;     Insomnia    Multinodular goiter last thyroid ultrasound in epic 12-18-2018   endocrinologist-- dr Loanne Drilling,  dx 2009 w/ hyperthroidism with medication until normal TFT in 2010;     Osteopenia 02/2018   T score -2.4 distal third of radius.  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   Wears contact lenses     Past Surgical History:  Procedure Laterality Date   BREAST LUMPECTOMY WITH RADIOACTIVE SEED AND SENTINEL LYMPH NODE BIOPSY Left 11/02/2017   Procedure: LEFT BREAST LUMPECTOMY WITH RADIOACTIVE SEED AND LEFT AXILLARY DEEP SENTINEL LYMPH NODE BIOPSY, INJECT BLUE DYE LEFT BREAST;  Surgeon: [name], MD;  Location: [place];  Service: General;  Laterality: Left;   BREAST SURGERY  2000   Breast cyst removed, left   LAPAROSCOPIC BILATERAL SALPINGO OOPHERECTOMY Bilateral 10/13/2019   Procedure: LAPAROSCOPIC BILATERAL SALPINGO OOPHORECTOMY LYSIS OF ADHESIONS PERITONEAL WASHINGS ;  Surgeon: [name], MD;  Location: [place];  Service: Gynecology;  Laterality: Bilateral;  request 7:30am OR time in [place] block requests one hour   TONSILLECTOMY  age 5   TUBAL LIGATION Bilateral yrs ago   VAGINAL HYSTERECTOMY  1991   Right ovarian cystectomy   VIDEO BRONCHOSCOPY Bilateral 03/16/2015   Procedure: VIDEO BRONCHOSCOPY WITHOUT FLUORO;  Surgeon: Brand Males, MD;  Location: [facility] ENDOSCOPY;  Service: Endoscopy;  Laterality: Bilateral;    Allergies as of 01/07/2021      Reactions   Doxycycline Rash  Other Other (See Comments)   Surgical glue : hives   Oxycodone Nausea And Vomiting   Sulfa Antibiotics Hives   Trimethoprim Sulfate [trimethoprim]    rash   Codeine Nausea Only   Propranolol Hcl Other (See Comments)   "Dizziness and heart racing"      Medication List       Accurate as of January 07, 2021 11:59 PM. If you have any questions, ask your nurse or doctor.        STOP taking these medications   promethazine 25 MG suppository Commonly known as: Phenergan Stopped by: Kathlene November, MD     TAKE  these medications   aspirin EC 81 MG tablet Take 81 mg by mouth daily.   B-complex with vitamin C tablet Take 1 tablet by mouth daily.   cetirizine 10 MG tablet Commonly known as: ZYRTEC Take 10 mg by mouth daily.   clorazepate 7.5 MG tablet Commonly known as: TRANXENE Take 1 tablet (7.5 mg total) by mouth at bedtime as needed for anxiety.   CRANBERRY PO Take 2 tablets by mouth in the morning and at bedtime.   famotidine 20 MG tablet Commonly known as: PEPCID Take 1 tablet (20 mg total) by mouth daily.   letrozole 2.5 MG tablet Commonly known as: FEMARA Take 1 tablet (2.5 mg total) by mouth daily.   methenamine 1 g tablet Commonly known as: HIPREX Take 1 g by mouth 2 (two) times daily with a meal. Take with 535m of vitamin c w/ each tablet   omeprazole 40 MG capsule Commonly known as: PRILOSEC TAKE 1 CAPSULE BY MOUTH EVERY DAY   PROBIOTIC PO Take by mouth.   Vitamin D 50 MCG (2000 UT) Caps Take 2,000 Units by mouth daily.          Objective:   Physical Exam BP 128/70 (BP Location: Right Arm, Patient Position: Sitting, Cuff Size: Small)    Pulse 67    Temp 98.1 F (36.7 C) (Oral)    Resp 16    Ht 5' 3.5" (1.613 m)    Wt 143 lb 4 oz (65 kg)    SpO2 96%    BMI 24.98 kg/m  General: Well developed, NAD, BMI noted Neck: No  thyromegaly  HEENT:  Normocephalic . Face symmetric, atraumatic Lungs:  CTA B Normal respiratory effort, no intercostal retractions, no accessory muscle use. Heart: RRR,  no murmur.  Abdomen:  Not distended, soft, non-tender. No rebound or rigidity.   Lower extremities: no pretibial edema bilaterally  Skin: Exposed areas without rash. Not pale. Not jaundice Neurologic:  alert & oriented X3.  Speech normal, gait and transfer somewhat limited by DJD Strength symmetric and appropriate for age.  Psych: Cognition and judgment appear intact.  Cooperative with normal attention span and concentration.  Behavior appropriate. No anxious or  depressed appearing.     Assessment     Assessment  Bronchiectasis Dr RChase CallerAnxiety - Insomnia : tranxene   At hs, rx by gyn, previously, I'll Rx if needed  Endocrinology Dr. ELoanne Drillingq 3 years, Last visit 03/2015 --H/o thyrotoxicosis on remission --Goiter   Osteopenia --T score -1.5  (2015), dexa 02-2016 , dexa 03/19/2018 (at gyn) DJD -- [facility]: GERD, HH and esophageal stricture EGD 2007, colon polyps Breast cancer, DX 08-2017, s/p lumpectomy,  XRT, B salpingo-oophorectomy Recurrent UTIs, onset ~ 2018  PLAN Here for CPX History of thyroid thyrotoxicosis: Checking a TSH Osteopenia: Follow-up by gynecology DJD: Sees Ortho as needed.  Aches and pains managed well with OTCs. GERD: Currently asymptomatic RTC 1 year    This visit occurred during the SARS-CoV-2 public health emergency.  Safety protocols were in place, including screening questions prior to the visit, additional usage of staff PPE, and extensive cleaning of exam room while observing appropriate contact time as indicated for disinfecting solutions.

## 2021-01-09 ENCOUNTER — Encounter: Payer: Self-pay | Admitting: Internal Medicine

## 2021-01-09 NOTE — Assessment & Plan Note (Signed)
-  Td 2020; zostavax 2010, prevanr 2014; pnm shot 2 : 07-2015; s/p shingrix -COVID VAX x3  -had a flu shot  -RXY:VOPF colonoscopy 08-2015, + polyps, cscope 04/2019, next per GI.  Female care:  --Sees gyn, s/p BSO  09/2019 (d/t h/o breast ca) ---breast ca dx 08-2017, MMG 08-2020 ([facility]) -Diet has a healthy diet, encouraged to stay active but at the same time be very mindful of falls.  See AVS. -Labs CMP, FLP, TSH, CBC-iron-ferritin (pt likes to check for anemia ).

## 2021-01-09 NOTE — Assessment & Plan Note (Signed)
Here for CPX History of thyroid thyrotoxicosis: Checking a TSH Osteopenia: Follow-up by gynecology DJD: Sees Ortho as needed.  Aches and pains managed well with OTCs. GERD: Currently asymptomatic RTC 1 year

## 2021-01-12 DIAGNOSIS — H25013 Cortical age-related cataract, bilateral: Secondary | ICD-10-CM | POA: Diagnosis not present

## 2021-01-12 DIAGNOSIS — H52203 Unspecified astigmatism, bilateral: Secondary | ICD-10-CM | POA: Diagnosis not present

## 2021-01-17 DIAGNOSIS — D485 Neoplasm of uncertain behavior of skin: Secondary | ICD-10-CM | POA: Diagnosis not present

## 2021-01-17 DIAGNOSIS — L438 Other lichen planus: Secondary | ICD-10-CM | POA: Diagnosis not present

## 2021-01-17 DIAGNOSIS — L820 Inflamed seborrheic keratosis: Secondary | ICD-10-CM | POA: Diagnosis not present

## 2021-02-16 DIAGNOSIS — R42 Dizziness and giddiness: Secondary | ICD-10-CM | POA: Diagnosis not present

## 2021-02-16 DIAGNOSIS — E7800 Pure hypercholesterolemia, unspecified: Secondary | ICD-10-CM | POA: Diagnosis not present

## 2021-02-25 ENCOUNTER — Telehealth: Payer: Self-pay | Admitting: Hematology and Oncology

## 2021-02-25 NOTE — Telephone Encounter (Signed)
Called pt to r/s appt per 4/29 sch msg. No answer. Left vm for pt to call back to r/s.

## 2021-03-03 ENCOUNTER — Ambulatory Visit: Payer: Medicare PPO | Admitting: Hematology and Oncology

## 2021-03-08 NOTE — Assessment & Plan Note (Signed)
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 Oncotype DX16: 4% risk of recurrence with 5 years of tamoxifen: Low risk 2. Adjuvant radiation therapy2/20/2019 to 01/11/2018 3. Adjuvant antiestrogen therapy: With letrozole 2.5 mg daily to started4/10/2017 stopped within a few months because of hot flashes and depression, restarted 08/06/2020 1 tablet every other day  She could not tolerate Effexor because of nausea and vomiting.  Letrozole toxicities: Complains of depression-like symptoms.  I instructed her to reduce the dosage in half.  She will take 1 tablet every other day.   Breast cancer surveillance: 1.Mammograms 11/21/19: Benign 2.Breast exam5/11/22:Benign  Bone density 04/20/2020: T score -3: Osteoporosis: This is on her forearm and therefore she is not on any bisphosphonates at this time. This is being managed by her gynecologist.  Return to clinic in 1 year for follow-up

## 2021-03-08 NOTE — Progress Notes (Signed)
Patient Care Team: Colon Branch, MD as PCP - Gaston Islam, MD as Consulting Physician (Orthopedic Surgery) Nicholas Lose, MD as Consulting Physician (Hematology and Oncology) [name], MD as Consulting Physician (General Surgery) Fontaine, Belinda Block, MD (Inactive) as Consulting Physician (Gynecology) Brand Males, MD as Consulting Physician (Pulmonary Disease) Kyung Rudd, MD as Consulting Physician (Radiation Oncology) Gardenia Phlegm, NP as Nurse Practitioner (Hematology and Oncology) Ardis Hughs, MD as Attending Physician (Urology) Melida Quitter, MD as Consulting Physician (Otolaryngology)  DIAGNOSIS:    ICD-10-CM   1. Malignant neoplasm of lower-inner quadrant of left breast in female, estrogen receptor positive ([place])  C50.312    Z17.0     SUMMARY OF ONCOLOGIC HISTORY: Oncology History  Malignant neoplasm of lower-inner quadrant of left breast in female, estrogen receptor positive ([place])  09/26/2017 Initial Diagnosis   Screening detected left breast calcifications 1.1 cm span axilla negative, biopsy revealed grade 2 invasive ductal carcinoma with DCIS ER 100% PR 2%, HER-2 negative ratio 0.78, Ki-67 20%, T1CN0 stage Ia clinical stage AJCC 8   11/02/2017 Surgery   Left Lumpectomy: IDC grade 2, 1.2 cm, 0/3 LN Neg, , ER 100% PR 2%, HER-2 negative ratio 0.78, Ki-67 20% T1c N0 Stage 1A   12/19/2017 - 01/09/2018 Radiation Therapy   Adjuvant radiation therapy   01/2018 - 04/05/2018 Anti-estrogen oral therapy   Letrozole daily stopped because of depression and hot flashes and myalgias     CHIEF COMPLIANT: Follow-up of left breast cancer on surveillance  INTERVAL HISTORY: Cynthia Thornton is a 75 y.o. with above-mentioned history of left breast cancer treated with lumpectomy,radiation,and whocould not tolerate antiestrogen therapy with letrozole.She is currently on surveillance. Mammogram on 09/20/20 showed no evidence of malignancy bilaterally. She  presents to the clinic todayfor annual follow-up.   ALLERGIES:  is allergic to doxycycline, other, oxycodone, sulfa antibiotics, trimethoprim sulfate [trimethoprim], codeine, and propranolol hcl.  MEDICATIONS:  Current Outpatient Medications  Medication Sig Dispense Refill  . aspirin EC 81 MG tablet Take 81 mg by mouth daily.    . B Complex-C (B-COMPLEX WITH VITAMIN C) tablet Take 1 tablet by mouth daily.    . cetirizine (ZYRTEC) 10 MG tablet Take 10 mg by mouth daily.    . Cholecalciferol (VITAMIN D) 2000 units CAPS Take 2,000 Units by mouth daily.    . clorazepate (TRANXENE) 7.5 MG tablet Take 1 tablet (7.5 mg total) by mouth at bedtime as needed for anxiety. 30 tablet 2  . CRANBERRY PO Take 2 tablets by mouth in the morning and at bedtime.    . famotidine (PEPCID) 20 MG tablet Take 1 tablet (20 mg total) by mouth daily. 90 tablet 4  . letrozole (FEMARA) 2.5 MG tablet Take 1 tablet (2.5 mg total) by mouth daily. 90 tablet 1  . methenamine (HIPREX) 1 g tablet Take 1 g by mouth 2 (two) times daily with a meal. Take with 569m of vitamin c w/ each tablet    . omeprazole (PRILOSEC) 40 MG capsule TAKE 1 CAPSULE BY MOUTH EVERY DAY 90 capsule 4  . Probiotic Product (PROBIOTIC PO) Take by mouth.     No current facility-administered medications for this visit.    PHYSICAL EXAMINATION: ECOG PERFORMANCE STATUS: 1 - Symptomatic but completely ambulatory  Vitals:   03/09/21 1421  BP: (!) 121/58  Pulse: 83  Resp: 18  Temp: 98.1 F (36.7 C)  SpO2: 100%   Filed Weights   03/09/21 1421  Weight: 143 lb 4.8  oz (65 kg)    BREAST: No palpable masses or nodules in either right or left breasts. No palpable axillary supraclavicular or infraclavicular adenopathy no breast tenderness or nipple discharge. (exam performed in the presence of a chaperone)  LABORATORY DATA:  I have reviewed the data as listed CMP Latest Ref Rng & Units 01/07/2021 08/13/2020 05/19/2020  Glucose 70 - 99 mg/dL 85 84 94   BUN 6 - 23 mg/dL _0 Creatinine 0.40 - 1.20 mg/dL 0.95 0.93 1.03  Sodium 135 - 145 mEq/L 134(L) 134(L) 136  Potassium 3.5 - 5.1 mEq/L 4.9 5.2 4.2  Chloride 96 - 112 mEq/L 99 100 102  CO2 19 - 32 mEq/L _1 Calcium 8.4 - 10.5 mg/dL 9.2 9.8 9.4  Total Protein 6.0 - 8.3 g/dL 6.4 - 6.8  Total Bilirubin 0.2 - 1.2 mg/dL 0.4 - 0.4  Alkaline Phos 39 - 117 U/L 83 - 98  AST 0 - 37 U/L 16 - 23  ALT 0 - 35 U/L 14 - 23    Lab Results  Component Value Date   WBC 5.7 01/07/2021   HGB 12.2 01/07/2021   HCT 35.2 (L) 01/07/2021   MCV 90.6 01/07/2021   PLT 258.0 01/07/2021   NEUTROABS 3.2 01/07/2021    ASSESSMENT & PLAN:  Malignant neoplasm of lower-inner quadrant of left breast in female, estrogen receptor positive ([place]) 10/31/17: Left Lumpectomy: IDC grade 2, 1.2 cm, 0/3 LN Neg, , ER 100% PR 2%, HER-2 negative ratio 0.78, Ki-67 20% T1c N0 Stage 1A  Treatment summary: 1. Oncotype DX16: 4% risk of recurrence with 5 years of tamoxifen: Low risk 2. Adjuvant radiation therapy2/20/2019 to 01/11/2018 3. Adjuvant antiestrogen therapy: With letrozole 2.5 mg daily to started4/10/2017 stopped within a few months because of hot flashes and depression, restarted 08/06/2020 1 tablet every other day  She could not tolerate Effexor because of nausea and vomiting.  Letrozole toxicities:  1.  Musculoskeletal aches and pains 2. depression-like symptoms: Much improved with half the dose  Breast cancer surveillance: 1.Mammograms 11/21/19: Benign 2.Breast exam5/11/22:Benign  Bone density 04/20/2020: T score -3: Osteoporosis: This is on her forearm and therefore she is not on any bisphosphonates at this time. This is being managed by her gynecologist.  She does not want to take any bisphosphonate therapy.  Return to clinic in 1 year for follow-up    No orders of the defined types were placed in this encounter.  The patient has a good understanding of the overall plan. she agrees  with it. she will call with any problems that may develop before the next visit here.  Total time spent: 20 mins including face to face time and time spent for planning, charting and coordination of care  Rulon Eisenmenger, MD, MPH 03/09/2021  I, Molly Dorshimer, am acting as scribe for Dr. Nicholas Lose.  I have reviewed the above documentation for accuracy and completeness, and I agree with the above.

## 2021-03-09 ENCOUNTER — Other Ambulatory Visit: Payer: Self-pay

## 2021-03-09 ENCOUNTER — Inpatient Hospital Stay: Payer: Medicare PPO | Attending: Hematology and Oncology | Admitting: Hematology and Oncology

## 2021-03-09 DIAGNOSIS — Z170 Estrogen receptor positive status [ER+]: Secondary | ICD-10-CM | POA: Diagnosis not present

## 2021-03-09 DIAGNOSIS — R232 Flushing: Secondary | ICD-10-CM | POA: Insufficient documentation

## 2021-03-09 DIAGNOSIS — Z79899 Other long term (current) drug therapy: Secondary | ICD-10-CM | POA: Insufficient documentation

## 2021-03-09 DIAGNOSIS — Z885 Allergy status to narcotic agent status: Secondary | ICD-10-CM | POA: Insufficient documentation

## 2021-03-09 DIAGNOSIS — M810 Age-related osteoporosis without current pathological fracture: Secondary | ICD-10-CM | POA: Diagnosis not present

## 2021-03-09 DIAGNOSIS — Z882 Allergy status to sulfonamides status: Secondary | ICD-10-CM | POA: Diagnosis not present

## 2021-03-09 DIAGNOSIS — Z881 Allergy status to other antibiotic agents status: Secondary | ICD-10-CM | POA: Insufficient documentation

## 2021-03-09 DIAGNOSIS — F32A Depression, unspecified: Secondary | ICD-10-CM | POA: Diagnosis not present

## 2021-03-09 DIAGNOSIS — Z79811 Long term (current) use of aromatase inhibitors: Secondary | ICD-10-CM | POA: Diagnosis not present

## 2021-03-09 DIAGNOSIS — C50312 Malignant neoplasm of lower-inner quadrant of left female breast: Secondary | ICD-10-CM | POA: Diagnosis not present

## 2021-03-09 DIAGNOSIS — M791 Myalgia, unspecified site: Secondary | ICD-10-CM | POA: Diagnosis not present

## 2021-03-09 DIAGNOSIS — R112 Nausea with vomiting, unspecified: Secondary | ICD-10-CM | POA: Diagnosis not present

## 2021-03-09 DIAGNOSIS — N951 Menopausal and female climacteric states: Secondary | ICD-10-CM | POA: Diagnosis not present

## 2021-03-09 DIAGNOSIS — M7918 Myalgia, other site: Secondary | ICD-10-CM | POA: Diagnosis not present

## 2021-03-09 MED ORDER — LETROZOLE 2.5 MG PO TABS
1.2500 mg | ORAL_TABLET | Freq: Every day | ORAL | 1 refills | Status: DC
Start: 2021-03-09 — End: 2022-01-23

## 2021-03-15 ENCOUNTER — Telehealth: Payer: Self-pay | Admitting: Hematology and Oncology

## 2021-03-15 NOTE — Telephone Encounter (Signed)
Scheduled appointment per 05/11 los. Left a detailed message.

## 2021-03-25 ENCOUNTER — Encounter: Payer: Self-pay | Admitting: Adult Health

## 2021-04-08 ENCOUNTER — Other Ambulatory Visit: Payer: Self-pay

## 2021-04-08 ENCOUNTER — Ambulatory Visit (INDEPENDENT_AMBULATORY_CARE_PROVIDER_SITE_OTHER): Payer: Medicare PPO | Admitting: Obstetrics & Gynecology

## 2021-04-08 ENCOUNTER — Encounter: Payer: Self-pay | Admitting: Obstetrics & Gynecology

## 2021-04-08 VITALS — BP 110/70 | Ht 63.25 in | Wt 148.0 lb

## 2021-04-08 DIAGNOSIS — Z90710 Acquired absence of both cervix and uterus: Secondary | ICD-10-CM | POA: Diagnosis not present

## 2021-04-08 DIAGNOSIS — Z01419 Encounter for gynecological examination (general) (routine) without abnormal findings: Secondary | ICD-10-CM

## 2021-04-08 DIAGNOSIS — Z9189 Other specified personal risk factors, not elsewhere classified: Secondary | ICD-10-CM | POA: Diagnosis not present

## 2021-04-08 DIAGNOSIS — Z90722 Acquired absence of ovaries, bilateral: Secondary | ICD-10-CM

## 2021-04-08 DIAGNOSIS — M810 Age-related osteoporosis without current pathological fracture: Secondary | ICD-10-CM | POA: Diagnosis not present

## 2021-04-08 DIAGNOSIS — Z853 Personal history of malignant neoplasm of breast: Secondary | ICD-10-CM

## 2021-04-08 DIAGNOSIS — Z9079 Acquired absence of other genital organ(s): Secondary | ICD-10-CM

## 2021-04-08 NOTE — Progress Notes (Signed)
Cynthia Thornton 1945-12-09 [id number]   History:    75 y.o.  G3P2A1L2     RP:  Established patient for Annual Gynecologic exam   HPI:  S/P Total Hysterectomy and then 09/2019 BSO.  Abstinent.  H/O left Breast Ca, carrier of a  variant of unknown significant AXIN2. Breasts normal.  Urine normal.  BMI 26.01.  Health labs with Dr Larose Kells.  [place] 2020.  BD 04/2020 Osteoporosis at Lt total forearm.  Vit D, Ca++.   Past medical history,surgical history, family history and social history were all reviewed and documented in the EPIC chart.  Gynecologic History No LMP recorded. Patient has had a hysterectomy.  Obstetric History OB History  Gravida Para Term Preterm AB Living  3 2 2   1 2   SAB IAB Ectopic Multiple Live Births               # Outcome Date GA Lbr Len/2nd Weight Sex Delivery Anes PTL Lv  3 AB           2 Term           1 Term              ROS: A ROS was performed and pertinent positives and negatives are included in the history.  GENERAL: No fevers or chills. HEENT: No change in vision, no earache, sore throat or sinus congestion. NECK: No pain or stiffness. CARDIOVASCULAR: No chest pain or pressure. No palpitations. PULMONARY: No shortness of breath, cough or wheeze. GASTROINTESTINAL: No abdominal pain, nausea, vomiting or diarrhea, melena or bright red blood per rectum. GENITOURINARY: No urinary frequency, urgency, hesitancy or dysuria. MUSCULOSKELETAL: No joint or muscle pain, no back pain, no recent trauma. DERMATOLOGIC: No rash, no itching, no lesions. ENDOCRINE: No polyuria, polydipsia, no heat or cold intolerance. No recent change in weight. HEMATOLOGICAL: No anemia or easy bruising or bleeding. NEUROLOGIC: No headache, seizures, numbness, tingling or weakness. PSYCHIATRIC: No depression, no loss of interest in normal activity or change in sleep pattern.     Exam:   BP 110/70 (BP Location: Right Arm, Patient Position: Sitting, Cuff Size: Small)   Ht 5' 3.25" (1.607 m)    Wt 148 lb (67.1 kg)   BMI 26.01 kg/m   Body mass index is 26.01 kg/m.  General appearance : Well developed well nourished female. No acute distress HEENT: Eyes: no retinal hemorrhage or exudates,  Neck supple, trachea midline, no carotid bruits, no thyroidmegaly Lungs: Clear to auscultation, no rhonchi or wheezes, or rib retractions  Heart: Regular rate and rhythm, no murmurs or gallops Breast:Examined in sitting and supine position were symmetrical in appearance, no palpable masses or tenderness,  no skin retraction, no nipple inversion, no nipple discharge, no skin discoloration, no axillary or supraclavicular lymphadenopathy Abdomen: no palpable masses or tenderness, no rebound or guarding Extremities: no edema or skin discoloration or tenderness  Pelvic: Vulva: Normal             Vagina: No gross lesions or discharge  Cervix/Uterus absent  Adnexa  Without masses or tenderness  Anus: Normal   Assessment/Plan:  75 y.o. female for annual exam   1. Encounter for gynecological examination without abnormal finding Gynecologic exam status post total hysterectomy, then BSO.  No indication for a Pap test at this time.  Breast exam normal status post left lumpectomy for left breast cancer.  Last screening mammogram was - November 2021.  Colonoscopy July 2020.  Health labs with family  physician.  Good body mass index at 26.01.  Continue with fitness and healthy nutrition.  2. Status post total hysterectomy and bilateral salpingo-oophorectomy (BSO)  3. Age-related osteoporosis without current pathological fracture Last bone density July 2021 showed osteoporosis at the left total forearm.  Patient will continue on vitamin D supplements with calcium intake of 1.5 g/day total and regular weightbearing physical activities.  We will repeat a bone density in July 2023.  4. History of breast cancer  Let breast Ca.  [name] MD, 10:11 AM 04/08/2021

## 2021-04-10 ENCOUNTER — Encounter: Payer: Self-pay | Admitting: Obstetrics & Gynecology

## 2021-04-27 ENCOUNTER — Ambulatory Visit: Payer: Medicare PPO | Admitting: Endocrinology

## 2021-04-27 ENCOUNTER — Other Ambulatory Visit: Payer: Self-pay

## 2021-04-27 VITALS — BP 116/72 | HR 63 | Ht 63.5 in | Wt 142.2 lb

## 2021-04-27 DIAGNOSIS — E042 Nontoxic multinodular goiter: Secondary | ICD-10-CM | POA: Diagnosis not present

## 2021-04-27 NOTE — Patient Instructions (Addendum)
No medication is needed for the thyroid now. Please come back for a follow-up appointment in 1 year.   most of the time, the blood may become abnormal again, but this may take many years.

## 2021-04-27 NOTE — Progress Notes (Signed)
Subjective:    Patient ID: Cynthia Thornton, female    DOB: Dec 31, 1945, 75 y.o.   MRN: [id number]  HPI pt returns for f/u of hyperthyroidism (dx'ed 2009; [place] in 2020 showed multiple small nodules (not big enough to need bx or f/u), were unchanged; she took thionamide rx for a few months in 2010 for suppressed TSH, but she went off due to normal TFT).  she does not notice the goiter.  pt states she feels well in general.   Past Medical History:  Diagnosis Date   Arthritis    Breast cancer, left [facility]) oncologist-- dr [name]   dx 09-26-2017--- IDC, Stage IA, Grade 2, ER/PR +;  HER2 negative;  s/p  breast lumpectomy w/ node dissection 11-02-2017;  completed radiation 01-09-2018 (left breast genetic screening panel 2017 with variant of unknown significance AXINA)   COPD (chronic obstructive pulmonary disease) ([place])    pulmologist-- dr Dillard Essex-- w/ chronic lingular scarring  (last exceratbation bronchiectasis 07/ 2020)   Dyslipidemia    followed by cardiology/ vascular-- dr g. adams ([facility] [place])  family history strokes   Eczema    Family history of breast cancer    Family history of colon cancer    GERD (gastroesophageal reflux disease)    Hiatal hernia    History of esophageal stricture    s/p dilatation 2008   History of external beam radiation therapy    left breast 12-19-2017  to 01-09-2018   History of hyperthyroidism    endocrinologist-- (lov 04-11-2019 epic) dr Loanne Drilling, dx 2009 due to multinodular goiter ,  had taken medication for few months 2010 stopped due to norma TFT;     Insomnia    Multinodular goiter last thyroid ultrasound in epic 12-18-2018   endocrinologist-- dr Loanne Drilling,  dx 2009 w/ hyperthroidism with medication until normal TFT in 2010;     Osteopenia 02/2018   T score -2.4 distal third of radius.  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   Wears contact lenses     Past Surgical History:  Procedure Laterality Date   BREAST LUMPECTOMY WITH RADIOACTIVE SEED AND SENTINEL LYMPH NODE BIOPSY Left 11/02/2017   Procedure: LEFT BREAST LUMPECTOMY WITH RADIOACTIVE SEED AND LEFT AXILLARY DEEP SENTINEL LYMPH NODE BIOPSY, INJECT BLUE DYE LEFT BREAST;  Surgeon: [name], MD;  Location: [place];  Service: General;  Laterality: Left;   BREAST SURGERY  2000   Breast cyst removed, left   LAPAROSCOPIC BILATERAL SALPINGO OOPHERECTOMY Bilateral 10/13/2019   Procedure: LAPAROSCOPIC BILATERAL SALPINGO OOPHORECTOMY LYSIS OF ADHESIONS PERITONEAL WASHINGS ;  Surgeon: [name], MD;  Location: [place];  Service: Gynecology;  Laterality: Bilateral;  request 7:30am OR time in [place] block requests one hour   TONSILLECTOMY  age 71   TUBAL LIGATION Bilateral yrs ago   VAGINAL HYSTERECTOMY  1991   Right ovarian cystectomy   VIDEO BRONCHOSCOPY Bilateral 03/16/2015   Procedure: VIDEO BRONCHOSCOPY WITHOUT FLUORO;  Surgeon: Brand Males, MD;  Location: [facility] END[facility];  Service: Endoscopy;  Laterality: Bilateral;    Social History  Socioeconomic History   Marital status: Single    Spouse name: Not on file   Number of children: 2   Years of education: Not on file   Highest education level: Not on file  Occupational History   Occupation: Pharmacist, hospital, works part time     Employer: [employer]  Tobacco Use   Smoking status: Former    Packs/day: 0.10    Years: 30.00    Pack years: 3.00    Types: Cigarettes    Quit date: 10/31/1995    Years since quitting: 25.5   Smokeless tobacco: Never  Vaping Use   Vaping Use: Never used  Substance and Sexual Activity    Alcohol use: Yes    Alcohol/week: 0.0 standard drinks    Comment: seldom   Drug use: No   Sexual activity: Not Currently    Birth control/protection: Surgical    Comment: 1st intercourse 75 yo-Fewer than 5 partners  Other Topics Concern   Not on file  Social History Narrative   Lives by herself   2 children, one has mental issues    Social Determinants of Radio broadcast assistant Strain: Low Risk    Difficulty of Paying Living Expenses: Not hard at all  Food Insecurity: No Food Insecurity   Worried About Charity fundraiser in the Last Year: Never true   [place] in the Last Year: Never true  Transportation Needs: No Transportation Needs   Lack of Transportation (Medical): No   Lack of Transportation (Non-Medical): No  Physical Activity: Inactive   Days of Exercise per Week: 0 days   Minutes of Exercise per Session: 0 min  Stress: No Stress Concern Present   Feeling of Stress : Not at all  Social Connections: Moderately Integrated   Frequency of Communication with Friends and Family: More than three times a week   Frequency of Social Gatherings with Friends and Family: More than three times a week   Attends Religious Services: More than 4 times per year   Active Member of [employer] or Organizations: Yes   Attends Music therapist: More than 4 times per year   Marital Status: Never married  Human resources officer Violence: Not At Risk   Fear of Current or Ex-Partner: No   Emotionally Abused: No   Physically Abused: No   Sexually Abused: No    Current Outpatient Medications on File Prior to Visit  Medication Sig Dispense Refill   aspirin EC 81 MG tablet Take 81 mg by mouth daily.     B Complex-C (B-COMPLEX WITH VITAMIN C) tablet Take 1 tablet by mouth daily.     cetirizine (ZYRTEC) 10 MG tablet Take 10 mg by mouth daily.     Cholecalciferol (VITAMIN D) 2000 units CAPS Take 2,000 Units by mouth daily.     clorazepate (TRANXENE) 7.5 MG tablet Take 1 tablet  (7.5 mg total) by mouth at bedtime as needed for anxiety. 30 tablet 2   CRANBERRY PO Take 2 tablets by mouth in the morning and at bedtime.     famotidine (PEPCID) 20 MG tablet Take 1 tablet (20 mg total) by mouth daily. 90 tablet 4   letrozole (FEMARA) 2.5 MG tablet Take 0.5 tablets (1.25 mg total) by mouth daily. 90 tablet 1   methenamine (HIPREX) 1 g tablet Take 1 g by mouth 2 (two) times daily with a meal. Take with 581m of vitamin c w/ each tablet  omeprazole (PRILOSEC) 40 MG capsule TAKE 1 CAPSULE BY MOUTH EVERY DAY 90 capsule 4   Probiotic Product (PROBIOTIC PO) Take by mouth.     No current facility-administered medications on file prior to visit.    Allergies  Allergen Reactions   Doxycycline Rash   Other Other (See Comments)    Surgical glue : hives   Oxycodone Nausea And Vomiting   Sulfa Antibiotics Hives   Trimethoprim Sulfate [Trimethoprim]     rash   Codeine Nausea Only   Propranolol Hcl Other (See Comments)    "Dizziness and heart racing"    Family History  Problem Relation Age of Onset   Osteoporosis Mother    Lung cancer Mother    Stroke Mother    Diabetes Father    Hypertension Father    Osteoporosis Father    Colon cancer Father 61   Breast cancer Sister        Age 39   Ovarian cancer Maternal Aunt 73   Breast cancer Cousin        maternal-Age 45   Colon cancer Paternal Uncle 55   Colon cancer Cousin        maternal first cousin   Breast cancer Maternal Aunt        great aunt- Age unknown   Crohn's disease Son    Leukemia Maternal Grandfather     BP 116/72   Pulse 63   Ht 5' 3.5" (1.613 m)   Wt 142 lb 3.2 oz (64.5 kg)   SpO2 99%   BMI 24.79 kg/m    Review of Systems     Objective:   Physical Exam VITAL SIGNS:  See vs page GENERAL: no distress NECK: There is no palpable thyroid enlargement.  No thyroid nodule is palpable.  No palpable lymphadenopathy at the anterior neck.   Lab Results  Component Value Date   TSH 3.00  01/07/2021   T4TOTAL 8.5 08/31/2011      Assessment & Plan:  MNG, stable off medication  Patient Instructions  No medication is needed for the thyroid now. Please come back for a follow-up appointment in 1 year.   most of the time, the blood may become abnormal again, but this may take many years.

## 2021-07-29 ENCOUNTER — Ambulatory Visit: Payer: Medicare PPO | Admitting: Internal Medicine

## 2021-08-22 ENCOUNTER — Ambulatory Visit: Payer: Medicare PPO | Admitting: Internal Medicine

## 2021-08-22 ENCOUNTER — Encounter: Payer: Self-pay | Admitting: Internal Medicine

## 2021-08-22 ENCOUNTER — Other Ambulatory Visit: Payer: Self-pay

## 2021-08-22 VITALS — BP 112/62 | HR 66 | Temp 97.8°F | Ht 63.5 in | Wt 143.6 lb

## 2021-08-22 DIAGNOSIS — J479 Bronchiectasis, uncomplicated: Secondary | ICD-10-CM

## 2021-08-22 DIAGNOSIS — R042 Hemoptysis: Secondary | ICD-10-CM

## 2021-08-22 NOTE — Patient Instructions (Addendum)
ICD-10-CM   1. Bronchiectasis without complication ([place])  B58.3    Glad you are doing well other than 1-2 episodes of hemoptysis in past year  Plan - get CT chest without contrast any time next few weeks -Continue flutter valve and as needed Mucinex -Recommend Covid bivalent booster   Follow-up - will call with CT results -12 months or sooner if needed

## 2021-08-22 NOTE — Progress Notes (Signed)
OV 10/04/2018  Subjective:  Patient ID: Cynthia Thornton, female , DOB: Dec 26, 1945 , age 75 y.o. , MRN: [id number] , ADDRESS: [place] [address]   10/04/2018 -   Chief Complaint  Patient presents with   Follow-up    f/u bronchiectasis, pt reports she is doing well , flutter valve has helped her get mucus up, some cough but has gotten better, mucus is thinner     HPI Cynthia Thornton 75 y.o. -presents for follow-up of her mild bronchiectasis with mild sputum production.  Last visit September 2019 we introduce flutter valve after discussing several options because sputum volume had increased.  At this point in time she says flutter valve works [employer].  She occasionally uses Mucinex.  She tries to avoid medicines.  She is up-to-date with her pulmonary vaccines.  She would benefit from a new Shingrix shingles vaccine which she will talk to her primary care about.     ROS - per HPI   OV 08/12/2020  Subjective:  Patient ID: Cynthia Thornton, female , DOB: [birth year] , age 75 y.o. , MRN: [id number] , ADDRESS: [place] [address] PCP Colon Branch, MD Patient Care Team: Colon Branch, MD as PCP - Gaston Islam, MD as Consulting Physician (Orthopedic Surgery) Nicholas Lose, MD as Consulting Physician (Hematology and Oncology) [name], MD as Consulting Physician (General Surgery) Fontaine, Belinda Block, MD (Inactive) as Consulting Physician (Gynecology) Brand Males, MD as Consulting Physician (Pulmonary Disease) Kyung Rudd, MD as Consulting Physician (Radiation Oncology) Delice Bison Charlestine Massed, NP as Nurse Practitioner (Hematology and Oncology) Ardis Hughs, MD as Attending Physician (Urology) Melida Quitter, MD as Consulting Physician (Otolaryngology)  This Provider for this visit: Treatment Team:  Attending Provider: Brand Males, MD    08/12/2020 -   Chief Complaint  Patient presents with   Follow-up     Bronchiectasis, doing well     HPI Cynthia Thornton 75 y.o. -follow-up for mild bronchiectasis.  Last seen in December 2019.  Then after the onset of the pandemic and never saw her.  She has been isolating.  She is fully vaccinated.  In July 2020 after exposure to work she had a bronchiectasis exacerbation.  She says since then she has been doing well.  She says that after seeing the nurse practitioner and starting Mucinex as needed and flutter valve she has been doing well.  There is no acute issues.     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 No evidence of acute cardiopulmonary disease. Electronically Signed   By: Julian Hy M.D.   On: 05/25/2016 17:16   ROS - per HPI    OV 08/22/2021  Subjective:  Patient ID: Cynthia Thornton, female , DOB: [birth year] , age 75 y.o. , MRN: [id number] , ADDRESS: [place] [id number] PCP Colon Branch, MD Patient Care Team: Colon Branch, MD as PCP - Gaston Islam, MD as Consulting Physician (Orthopedic Surgery) Nicholas Lose, MD as Consulting Physician (Hematology and Oncology) [name], MD as Consulting Physician (General Surgery) Fontaine, Belinda Block, MD (Inactive) as Consulting Physician (Gynecology) Brand Males, MD as Consulting Physician (Pulmonary Disease) Kyung Rudd, MD as Consulting Physician (Radiation Oncology) Delice Bison, Charlestine Massed, NP as Nurse Practitioner (Hematology and Oncology) Ardis Hughs, MD as Attending Physician (Urology) Melida Quitter, MD  as Consulting Physician (Otolaryngology)  This Provider for this visit: Treatment Team:  Attending Provider: Brand Males, MD    08/22/2021 -   Chief Complaint  Patient presents with   Follow-up    Pt states she has been doing well since last visit. States that she has not been coughing up  any blood.     HPI Cynthia Thornton 75 y.o. -follow-up mild bronchiectasis.  Last CT scan of the chest 2017.  Last pulmonary function test 2014.  Is a 1 year follow-up.  She continues to do well.  No respiratory symptoms other than a couple of episodes of mild hemoptysis in the last 1 year.  Up-to-date with flu shot.  In July 2022 she had COVID booster but now that is the by [place] COVID booster and she wants to know if she can or should have it.  She has never had COVID.  Overall doing well without any complaints.    CT Chest data  No results found.    PFT  PFT Results Latest Ref Rng & Units 10/20/2013  FVC-Pre L 3.34  FVC-Predicted Pre % 106  FVC-Post L 3.49  FVC-Predicted Post % 111  Pre FEV1/FVC % % 67  Post FEV1/FCV % % 75  FEV1-Pre L 2.26  FEV1-Predicted Pre % 94  FEV1-Post L 2.62       has a past medical history of Arthritis, Breast cancer, left [facility]) (oncologist-- dr [name]), COPD (chronic obstructive pulmonary disease) ([place]), Dyslipidemia, Eczema, Family history of breast cancer, Family history of colon cancer, GERD (gastroesophageal reflux disease), Hiatal hernia, History of esophageal stricture, History of external beam radiation therapy, History of hyperthyroidism, Insomnia, Multinodular goiter (last thyroid ultrasound in epic 12-18-2018), Osteopenia (02/2018), Personal history of colonic polyps (09/06/2001), Pityriasis rosea, PONV (postoperative nausea and vomiting), Primary osteoarthritis of left knee, Rhomboid pain, and Wears contact lenses.   reports that she quit smoking about 25 years ago. Her smoking use included cigarettes. She has a 3.00 pack-year smoking history. She has never used smokeless tobacco.  Past Surgical History:  Procedure Laterality Date   BREAST LUMPECTOMY WITH RADIOACTIVE SEED AND SENTINEL LYMPH NODE BIOPSY Left 11/02/2017   Procedure: LEFT BREAST LUMPECTOMY WITH RADIOACTIVE SEED AND LEFT AXILLARY DEEP SENTINEL LYMPH NODE BIOPSY, INJECT BLUE DYE  LEFT BREAST;  Surgeon: [name], MD;  Location: [facility];  Service: General;  Laterality: Left;   BREAST SURGERY  2000   Breast cyst removed, left   LAPAROSCOPIC BILATERAL SALPINGO OOPHERECTOMY Bilateral 10/13/2019   Procedure: LAPAROSCOPIC BILATERAL SALPINGO OOPHORECTOMY LYSIS OF ADHESIONS PERITONEAL WASHINGS ;  Surgeon: [name], MD;  Location: [place];  Service: Gynecology;  Laterality: Bilateral;  request 7:30am OR time in [place] block requests one hour   TONSILLECTOMY  age 81   TUBAL LIGATION Bilateral yrs ago   VAGINAL HYSTERECTOMY  1991   Right ovarian cystectomy   VIDEO BRONCHOSCOPY Bilateral 03/16/2015   Procedure: VIDEO BRONCHOSCOPY WITHOUT FLUORO;  Surgeon: Brand Males, MD;  Location[facility] END[facility]COPY;  Service: Endoscopy;  Laterality: Bilateral;    Allergies  Allergen Reactions   Doxycycline Rash   Other Other (See Comments)    Surgical glue : hives   Oxycodone Nausea And Vomiting   Sulfa Antibiotics Hives   Trimethoprim Sulfate [Trimethoprim]     rash   Codeine Nausea Only   Propranolol Hcl Other (See Comments)    "Dizziness and heart racing"    Immunization History  Administered Date(s) Administered  Influenza Split 08/31/2011, 07/30/2014   Influenza Whole 08/30/2009, 07/30/2010   Influenza, High Dose Seasonal PF 07/05/2018, 08/02/2019, 08/15/2021   Influenza,inj,Quad PF,6+ Mos 09/30/2012, 10/10/2013, 07/26/2015   Influenza-Unspecified 08/18/2016, 07/30/2020   Moderna Sars-Covid-2 Vaccination 11/29/2019, 12/29/2019, 10/14/2020, 05/19/2021   Pneumococcal Conjugate-13 10/10/2013   Pneumococcal Polysaccharide-23 07/27/2015   Td 12/02/2008   Tdap 01/03/2019   Zoster Recombinat (Shingrix) 01/03/2019, 04/18/2019   Zoster, Live 12/02/2008    Family History  Problem Relation Age of Onset   Osteoporosis Mother    Lung cancer Mother    Stroke Mother    Diabetes Father    Hypertension Father     Osteoporosis Father    Colon cancer Father 76   Breast cancer Sister        Age 20   Ovarian cancer Maternal Aunt 60   Breast cancer Cousin        maternal-Age 77   Colon cancer Paternal Uncle 15   Colon cancer Cousin        maternal first cousin   Breast cancer Maternal Aunt        great aunt- Age unknown   Crohn's disease Son    Leukemia Maternal Grandfather      Current Outpatient Medications:    aspirin EC 81 MG tablet, Take 81 mg by mouth daily., Disp: , Rfl:    B Complex-C (B-COMPLEX WITH VITAMIN C) tablet, Take 1 tablet by mouth daily., Disp: , Rfl:    cetirizine (ZYRTEC) 10 MG tablet, Take 10 mg by mouth daily., Disp: , Rfl:    Cholecalciferol (VITAMIN D) 2000 units CAPS, Take 2,000 Units by mouth daily., Disp: , Rfl:    clorazepate (TRANXENE) 7.5 MG tablet, Take 1 tablet (7.5 mg total) by mouth at bedtime as needed for anxiety., Disp: 30 tablet, Rfl: 2   CRANBERRY PO, Take 2 tablets by mouth in the morning and at bedtime., Disp: , Rfl:    famotidine (PEPCID) 20 MG tablet, Take 1 tablet (20 mg total) by mouth daily., Disp: 90 tablet, Rfl: 4   letrozole (FEMARA) 2.5 MG tablet, Take 0.5 tablets (1.25 mg total) by mouth daily., Disp: 90 tablet, Rfl: 1   methenamine (HIPREX) 1 g tablet, Take 1 g by mouth 2 (two) times daily with a meal. Take with 500mg  of vitamin c w/ each tablet, Disp: , Rfl:    omeprazole (PRILOSEC) 40 MG capsule, TAKE 1 CAPSULE BY MOUTH EVERY DAY, Disp: 90 capsule, Rfl: 4   Probiotic Product (PROBIOTIC PO), Take by mouth., Disp: , Rfl:       Objective:   Vitals:   08/22/21 1605  BP: 112/62  Pulse: 66  Temp: 97.8 F (36.6 C)  TempSrc: Oral  SpO2: 99%  Weight: 143 lb 9.6 oz (65.1 kg)  Height: 5' 3.5" (1.613 m)    Estimated body mass index is 25.04 kg/m as calculated from the following:   Height as of this encounter: 5' 3.5" (1.613 m).   Weight as of this encounter: 143 lb 9.6 oz (65.1 kg).  @WEIGHTCHANGE @  [employer]   08/22/21 1605   Weight: 143 lb 9.6 oz (65.1 kg)     Physical Exam General: No distress. Looks well Neuro: Alert and Oriented x 3. GCS 15. Speech normal Psych: Pleasant Resp:  Barrel Chest - no.  Wheeze - no, Crackles - no, No overt respiratory distress CVS: Normal heart sounds. Murmurs - no Ext: Stigmata of Connective Tissue Disease - no HEENT: Normal upper airway. PEERL +.  No post nasal drip        Assessment:       ICD-10-CM   1. Bronchiectasis without complication ([place])  G54.9     2. Hemoptysis  R04.2          Plan:     Patient Instructions     ICD-10-CM   1. Bronchiectasis without complication ([place])  I26.4    Glad you are doing well other than 1-2 episodes of hemoptysis in past year  Plan - get CT chest without contrast any time next few weeks -Continue flutter valve and as needed Mucinex -Recommend Covid bivalent booster   Follow-up - will call with CT results -12 months or sooner if needed    SIGNATURE    Dr. Brand Males, M.D., F.C.C.P,  Pulmonary and Critical Care Medicine Staff Physician, [place] Director - Interstitial Lung Disease  Program  [facility] [place] [facility] [place], [place], [address]  Pager: [phone], If no answer or between  15:00h - 7:00h: call [phone] Telephone: [phone]  4:38 PM 08/22/2021

## 2021-08-22 NOTE — Addendum Note (Signed)
Addended by: Lorretta Harp on: 08/22/2021 04:42 PM   Modules accepted: Orders

## 2021-08-30 ENCOUNTER — Other Ambulatory Visit: Payer: Self-pay

## 2021-08-30 ENCOUNTER — Ambulatory Visit (INDEPENDENT_AMBULATORY_CARE_PROVIDER_SITE_OTHER)
Admission: RE | Admit: 2021-08-30 | Discharge: 2021-08-30 | Disposition: A | Discharge status: Home / Self Care | Payer: Medicare PPO | Source: Ambulatory Visit | Attending: Internal Medicine | Admitting: Internal Medicine

## 2021-08-30 DIAGNOSIS — J479 Bronchiectasis, uncomplicated: Secondary | ICD-10-CM

## 2021-08-30 DIAGNOSIS — R911 Solitary pulmonary nodule: Secondary | ICD-10-CM | POA: Diagnosis not present

## 2021-09-07 DIAGNOSIS — C50312 Malignant neoplasm of lower-inner quadrant of left female breast: Secondary | ICD-10-CM | POA: Diagnosis not present

## 2021-09-12 ENCOUNTER — Other Ambulatory Visit: Payer: Self-pay

## 2021-09-12 ENCOUNTER — Ambulatory Visit: Payer: Medicare PPO | Admitting: Family Medicine

## 2021-09-12 VITALS — BP 132/80 | HR 73 | Temp 98.1°F | Resp 18 | Ht 63.5 in | Wt 145.8 lb

## 2021-09-12 DIAGNOSIS — R309 Painful micturition, unspecified: Secondary | ICD-10-CM

## 2021-09-12 DIAGNOSIS — D485 Neoplasm of uncertain behavior of skin: Secondary | ICD-10-CM | POA: Diagnosis not present

## 2021-09-12 DIAGNOSIS — R300 Dysuria: Secondary | ICD-10-CM

## 2021-09-12 DIAGNOSIS — L309 Dermatitis, unspecified: Secondary | ICD-10-CM | POA: Diagnosis not present

## 2021-09-12 LAB — POCT URINALYSIS DIP (MANUAL ENTRY)
Bilirubin, UA: NEGATIVE
Blood, UA: NEGATIVE
Glucose, UA: NEGATIVE mg/dL
Ketones, POC UA: NEGATIVE mg/dL
Nitrite, UA: NEGATIVE
Protein Ur, POC: NEGATIVE mg/dL
Spec Grav, UA: 1.01 (ref 1.010–1.025)
Urobilinogen, UA: 0.2 E.U./dL
pH, UA: 7 (ref 5.0–8.0)

## 2021-09-12 MED ORDER — AMOXICILLIN-POT CLAVULANATE 500-125 MG PO TABS
1.0000 | ORAL_TABLET | Freq: Two times a day (BID) | ORAL | 0 refills | Status: DC
Start: 2021-09-12 — End: 2021-11-11

## 2021-09-12 NOTE — Progress Notes (Addendum)
[facility] [address] [place], [place] [address] [phone]  Date:  09/12/2021   Name:  Cynthia Thornton   DOB:  08/16/1946   MRN:  [id number]  PCP:  Colon Branch, MD    Chief Complaint: pssible UTI (Pt started having pressure and some aching when urination yesterday but did not want to risk sepsis again. )   History of Present Illness:  Cynthia Thornton is a 75 y.o. very pleasant female patient who presents with the following:  Pt seen today for concern of UTI Primary pt of Dr Beckey Downing afternoon she noted a pressure and discomfort when she urinated.  She tends to have urge incontinence but this is worse since yesterday  No fever or chills  She got septic last year due to UTI- she understandably did not want to let this happen again  No hemautia noted No vomtiing or back pain   Most recent positive culture 7/21 ISOLATE 1: Escherichia coli Abnormal    Comment: 10,000-49,000 CFU/mL of Escherichia coli  Resulting Agency [facility]     Susceptibility   Escherichia coli    URINE CULTURE, REFLEX    AMOX/CLAVULANIC 8  Sensitive    AMPICILLIN >=32  Resistant    AMPICILLIN/SULBACTAM 16  Intermediate    CEFAZOLIN <=4  Not Reportable 1    CEFEPIME <=1  Sensitive    CEFTRIAXONE <=1  Sensitive    CIPROFLOXACIN <=0.25  Sensitive    ERTAPENEM <=0.5  Sensitive    GENTAMICIN <=1  Sensitive    IMIPENEM <=0.25  Sensitive    LEVOFLOXACIN <=0.12  Sensitive    NITROFURANTOIN <=16  Sensitive    PIP/TAZO <=4  Sensitive    TOBRAMYCIN <=1  Sensitive    TRIMETH/SULFA >=320  Resistant          Otherwise she is feeling well today  Patient Active Problem List   Diagnosis Date Noted   Acute pyelonephritis 04/23/2020   Tinnitus 06/27/2019   Bronchiectasis with (acute) exacerbation ([place]) 05/15/2019   Anxiety 03/29/2019   Insomnia 03/29/2019   Malignant neoplasm of lower-inner quadrant of left breast in female, estrogen  receptor positive ([place]) 10/02/2017   Genetic testing 03/03/2016   Family history of breast cancer    Annual physical exam 07/27/2015   PCP NOTES >>>>>>> 07/27/2015   Smoking history 01/15/2015   Family hx of colon cancer 09/10/2014   Change in bowel habits 09/10/2014   Bronchiectasis ([place]) 07/28/2012   Osteopenia    Hypercholesteremia 10/03/2010   GOITER, MULTINODULAR 11/27/2008   UNSPECIFIED ANEMIA 11/02/2008   Thyrotoxicosis 10/22/2008   COLONIC POLYPS, BENIGN 01/16/2008   DEGENERATIVE JOINT DISEASE 01/16/2008   ESOPHAGEAL STRICTURE 04/05/2007   Headache(784.0) 04/05/2007   HIATAL HERNIA 08/15/2006   GERD 07/30/2006    Past Medical History:  Diagnosis Date   Arthritis    Breast cancer, left [facility] [place]) oncologist-- dr [name]   dx 09-26-2017--- IDC, Stage IA, Grade 2, ER/PR +;  HER2 negative;  s/p  breast lumpectomy w/ node dissection 11-02-2017;  completed radiation 01-09-2018 (left breast genetic screening panel 2017 with variant of unknown significance AXINA)   COPD (chronic obstructive pulmonary disease) ([place])    pulmologist-- dr Dillard Essex-- w/ chronic lingular scarring  (last exceratbation bronchiectasis 07/ 2020)   Dyslipidemia    followed by cardiology/ vascular-- dr g. adams ([facility] in [place])  family history strokes   Eczema  Family history of breast cancer    Family history of colon cancer    GERD (gastroesophageal reflux disease)    Hiatal hernia    History of esophageal stricture    s/p dilatation 2008   History of external beam radiation therapy    left breast 12-19-2017  to 01-09-2018   History of hyperthyroidism    endocrinologist-- (lov 04-11-2019 epic) dr Loanne Drilling, dx 2009 due to multinodular goiter ,  had taken medication for few months 2010 stopped due to norma TFT;     Insomnia    Multinodular goiter last thyroid ultrasound in epic 12-18-2018   endocrinologist-- dr Loanne Drilling,  dx 2009 w/ hyperthroidism with medication until normal TFT in  2010;     Osteopenia 02/2018   T score -2.4 distal third of radius.  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   Wears contact lenses     Past Surgical History:  Procedure Laterality Date   BREAST LUMPECTOMY WITH RADIOACTIVE SEED AND SENTINEL LYMPH NODE BIOPSY Left 11/02/2017   Procedure: LEFT BREAST LUMPECTOMY WITH RADIOACTIVE SEED AND LEFT AXILLARY DEEP SENTINEL LYMPH NODE BIOPSY, INJECT BLUE DYE LEFT BREAST;  Surgeon: [name], MD;  Location: [facility];  Service: General;  Laterality: Left;   BREAST SURGERY  2000   Breast cyst removed, left   LAPAROSCOPIC BILATERAL SALPINGO OOPHERECTOMY Bilateral 10/13/2019   Procedure: LAPAROSCOPIC BILATERAL SALPINGO OOPHORECTOMY LYSIS OF ADHESIONS PERITONEAL WASHINGS ;  Surgeon: [name], MD;  Location: [place] [facility];  Service: Gynecology;  Laterality: Bilateral;  request 7:30am OR time in [place] block requests one hour   TONSILLECTOMY  age 36   TUBAL LIGATION Bilateral yrs ago   VAGINAL HYSTERECTOMY  1991   Right ovarian cystectomy   VIDEO BRONCHOSCOPY Bilateral 03/16/2015   Procedure: VIDEO BRONCHOSCOPY WITHOUT FLUORO;  Surgeon: Brand Males, MD;  Location: [facility] ENDOSCOPY;  Service: Endoscopy;  Laterality: Bilateral;    Social History   Tobacco Use   Smoking status: Former    Packs/day: 0.10    Years: 30.00    Pack years: 3.00    Types: Cigarettes    Quit date: 10/31/1995    Years since quitting: 25.8   Smokeless tobacco: Never  Vaping Use   Vaping Use: Never used  Substance Use Topics   Alcohol use: Yes    Alcohol/week: 0.0 standard drinks     Comment: seldom   Drug use: No    Family History  Problem Relation Age of Onset   Osteoporosis Mother    Lung cancer Mother    Stroke Mother    Diabetes Father    Hypertension Father    Osteoporosis Father    Colon cancer Father 75   Breast cancer Sister        Age 33   Ovarian cancer Maternal Aunt 25   Breast cancer Cousin        maternal-Age 53   Colon cancer Paternal Uncle 32   Colon cancer Cousin        maternal first cousin   Breast cancer Maternal Aunt  great aunt- Age unknown   Crohn's disease Son    Leukemia Maternal Grandfather     Allergies  Allergen Reactions   Doxycycline Rash   Other Other (See Comments)    Surgical glue : hives   Oxycodone Nausea And Vomiting   Sulfa Antibiotics Hives   Trimethoprim Sulfate [Trimethoprim]     rash   Codeine Nausea Only   Propranolol Hcl Other (See Comments)    "Dizziness and heart racing"    Medication list has been reviewed and updated.  Current Outpatient Medications on File Prior to Visit  Medication Sig Dispense Refill   aspirin EC 81 MG tablet Take 81 mg by mouth daily.     B Complex-C (B-COMPLEX WITH VITAMIN C) tablet Take 1 tablet by mouth daily.     cetirizine (ZYRTEC) 10 MG tablet Take 10 mg by mouth daily.     Cholecalciferol (VITAMIN D) 2000 units CAPS Take 2,000 Units by mouth daily.     clorazepate (TRANXENE) 7.5 MG tablet Take 1 tablet (7.5 mg total) by mouth at bedtime as needed for anxiety. 30 tablet 2   CRANBERRY PO Take 2 tablets by mouth in the morning and at bedtime.     famotidine (PEPCID) 20 MG tablet Take 1 tablet (20 mg total) by mouth daily. 90 tablet 4   letrozole (FEMARA) 2.5 MG tablet Take 0.5 tablets (1.25 mg total) by mouth daily. 90 tablet 1   methenamine (HIPREX) 1 g tablet Take 1 g by mouth 2 (two) times daily with a meal. Take with 58m of vitamin c w/ each tablet     omeprazole (PRILOSEC) 40 MG capsule TAKE 1 CAPSULE BY MOUTH EVERY DAY 90 capsule 4   Probiotic Product  (PROBIOTIC PO) Take by mouth.     No current facility-administered medications on file prior to visit.    Review of Systems:  As per HPI- otherwise negative.   Physical Examination: Vitals:   09/12/21 1424  BP: 132/80  Pulse: 73  Resp: 18  Temp: 98.1 F (36.7 C)  SpO2: 98%   Vitals:   09/12/21 1424  Weight: 145 lb 12.8 oz (66.1 kg)  Height: 5' 3.5" (1.613 m)   Body mass index is 25.42 kg/m. Ideal Body Weight: Weight in (lb) to have BMI = 25: 143.1  GEN: no acute distress.  Minimal overweight, looks well and younger than age H39 Atraumatic, Normocephalic.  Ears and Nose: No external deformity. CV: RRR, No M/G/R. No JVD. No thrill. No extra heart sounds. PULM: CTA B, no wheezes, crackles, rhonchi. No retractions. No resp. distress. No accessory muscle use. ABD: S, NT, ND, +BS. No rebound. No HSM.  Belly is benign EXTR: No c/c/e PSYCH: Normally interactive. Conversant.  No CVA tenderness  Results for orders placed or performed in visit on 09/12/21  POCT urinalysis dipstick  Result Value Ref Range   Color, UA yellow yellow   Clarity, UA cloudy (A) clear   Glucose, UA negative negative mg/dL   Bilirubin, UA negative negative   Ketones, POC UA negative negative mg/dL   Spec Grav, UA 1.010 1.010 - 1.025   Blood, UA negative negative   pH, UA 7.0 5.0 - 8.0   Protein Ur, POC negative negative mg/dL   Urobilinogen, UA 0.2 0.2 or 1.0 E.U./dL   Nitrite, UA Negative Negative   Leukocytes, UA Small (1+) (A) Negative    Assessment and Plan: Pain with urination - Plan: Urine Culture, POCT urinalysis dipstick, amoxicillin-clavulanate (AUGMENTIN) 500-125 MG  tablet  Dysuria - Plan: amoxicillin-clavulanate (AUGMENTIN) 500-125 MG tablet  Patient seen today with possible UTI.  Urinalysis as above, started on Augmentin.  Await urine culture.  She is advised to alert me if not feeling better within 24 to 48 hours-sooner if worse  Signed Lamar Blinks, MD  Received urine  culture as below, 11/16 Patient is on Augmentin which should be effective against this bacteria.  We will send message to patient  Results for orders placed or performed in visit on 09/12/21  Urine Culture   Specimen: Urine  Result Value Ref Range   MICRO NUMBER: [id number]    SPECIMEN QUALITY: Adequate    Sample Source NOT GIVEN    STATUS: FINAL    ISOLATE 1: Escherichia coli (A)       Susceptibility   Escherichia coli - URINE CULTURE, REFLEX    AMOX/CLAVULANIC <=2 Sensitive     AMPICILLIN <=2 Sensitive     AMPICILLIN/SULBACTAM <=2 Sensitive     CEFAZOLIN* <=4 Not Reportable      * For infections other than uncomplicated UTI caused by E. coli, K. pneumoniae or P. mirabilis: Cefazolin is resistant if MIC > or = 8 mcg/mL. (Distinguishing susceptible versus intermediate for isolates with MIC < or = 4 mcg/mL requires additional testing.) For uncomplicated UTI caused by E. coli, K. pneumoniae or P. mirabilis: Cefazolin is susceptible if MIC <32 mcg/mL and predicts susceptible to the oral agents cefaclor, cefdinir, cefpodoxime, cefprozil, cefuroxime, cephalexin and loracarbef.     CEFTAZIDIME <=1 Sensitive     CEFEPIME <=1 Sensitive     CEFTRIAXONE <=1 Sensitive     CIPROFLOXACIN <=0.25 Sensitive     LEVOFLOXACIN <=0.12 Sensitive     GENTAMICIN <=1 Sensitive     IMIPENEM <=0.25 Sensitive     NITROFURANTOIN <=16 Sensitive     PIP/TAZO <=4 Sensitive     TOBRAMYCIN <=1 Sensitive     TRIMETH/SULFA* <=20 Sensitive      * For infections other than uncomplicated UTI caused by E. coli, K. pneumoniae or P. mirabilis: Cefazolin is resistant if MIC > or = 8 mcg/mL. (Distinguishing susceptible versus intermediate for isolates with MIC < or = 4 mcg/mL requires additional testing.) For uncomplicated UTI caused by E. coli, K. pneumoniae or P. mirabilis: Cefazolin is susceptible if MIC <32 mcg/mL and predicts susceptible to the oral agents cefaclor, cefdinir, cefpodoxime, cefprozil,  cefuroxime, cephalexin and loracarbef. Legend: S = Susceptible  I = Intermediate R = Resistant  NS = Not susceptible * = Not tested  NR = Not reported **NN = See antimicrobic comments   POCT urinalysis dipstick  Result Value Ref Range   Color, UA yellow yellow   Clarity, UA cloudy (A) clear   Glucose, UA negative negative mg/dL   Bilirubin, UA negative negative   Ketones, POC UA negative negative mg/dL   Spec Grav, UA 1.010 1.010 - 1.025   Blood, UA negative negative   pH, UA 7.0 5.0 - 8.0   Protein Ur, POC negative negative mg/dL   Urobilinogen, UA 0.2 0.2 or 1.0 E.U./dL   Nitrite, UA Negative Negative   Leukocytes, UA Small (1+) (A) Negative

## 2021-09-12 NOTE — Patient Instructions (Signed)
Good to see you today- I will be in touch with your urine culture asap We will treat you with augmentin for now However, if you are not improving or if you are getting worse in the next 1-2 days please contact me!

## 2021-09-14 ENCOUNTER — Encounter: Payer: Self-pay | Admitting: Family Medicine

## 2021-09-14 LAB — URINE CULTURE
MICRO NUMBER:: 12633202
SPECIMEN QUALITY:: ADEQUATE

## 2021-09-21 DIAGNOSIS — R922 Inconclusive mammogram: Secondary | ICD-10-CM | POA: Diagnosis not present

## 2021-09-21 DIAGNOSIS — Z853 Personal history of malignant neoplasm of breast: Secondary | ICD-10-CM | POA: Diagnosis not present

## 2021-09-23 ENCOUNTER — Other Ambulatory Visit: Payer: Self-pay | Admitting: Nurse Practitioner

## 2021-09-26 ENCOUNTER — Encounter: Payer: Self-pay | Admitting: Obstetrics & Gynecology

## 2021-10-24 ENCOUNTER — Other Ambulatory Visit: Payer: Self-pay | Admitting: Nurse Practitioner

## 2021-11-08 ENCOUNTER — Telehealth: Payer: Self-pay | Admitting: Internal Medicine

## 2021-11-08 DIAGNOSIS — R399 Unspecified symptoms and signs involving the genitourinary system: Secondary | ICD-10-CM

## 2021-11-08 NOTE — Telephone Encounter (Signed)
If severe symptoms: Nausea, vomiting, flank pain, stomach pain: Urgent care. If she only have mild symptoms such as dysuria okay to bring the specimen: UA, urine culture.

## 2021-11-08 NOTE — Telephone Encounter (Signed)
No appts available. Please advise.

## 2021-11-08 NOTE — Telephone Encounter (Signed)
LMOM informing Pt of appt/uc appt guidelines, instructed to call office to schedule lab appt if mild symptoms. Orders placed.

## 2021-11-08 NOTE — Telephone Encounter (Signed)
Pt has a uti and would like to come in for labs today. No active request. Please advise.

## 2021-11-09 ENCOUNTER — Other Ambulatory Visit (INDEPENDENT_AMBULATORY_CARE_PROVIDER_SITE_OTHER): Payer: Medicare PPO

## 2021-11-09 DIAGNOSIS — R399 Unspecified symptoms and signs involving the genitourinary system: Secondary | ICD-10-CM

## 2021-11-10 DIAGNOSIS — R102 Pelvic and perineal pain: Secondary | ICD-10-CM | POA: Diagnosis not present

## 2021-11-10 DIAGNOSIS — N3020 Other chronic cystitis without hematuria: Secondary | ICD-10-CM | POA: Diagnosis not present

## 2021-11-10 LAB — URINALYSIS, ROUTINE W REFLEX MICROSCOPIC
Bilirubin Urine: NEGATIVE
Ketones, ur: NEGATIVE
Nitrite: NEGATIVE
Specific Gravity, Urine: <=1.005 — AB (ref 1.000–1.030)
Total Protein, Urine: NEGATIVE
Urine Glucose: NEGATIVE
Urobilinogen, UA: 0.2 (ref 0.0–1.0)
pH: 6 (ref 5.0–8.0)

## 2021-11-11 LAB — URINE CULTURE
MICRO NUMBER:: 12856988
SPECIMEN QUALITY:: ADEQUATE

## 2021-11-11 MED ORDER — AMOXICILLIN-POT CLAVULANATE 875-125 MG PO TABS: 1.0000 | ORAL_TABLET | Freq: Two times a day (BID) | ORAL | 0 refills | Status: DC

## 2021-11-11 NOTE — Addendum Note (Signed)
Addended byDamita Dunnings D on: 11/11/2021 02:17 PM   Modules accepted: Orders

## 2021-11-23 ENCOUNTER — Other Ambulatory Visit: Payer: Self-pay | Admitting: Nurse Practitioner

## 2021-12-09 ENCOUNTER — Other Ambulatory Visit: Payer: Self-pay | Admitting: Nurse Practitioner

## 2021-12-10 ENCOUNTER — Other Ambulatory Visit: Payer: Self-pay | Admitting: Nurse Practitioner

## 2021-12-19 ENCOUNTER — Telehealth: Payer: Self-pay | Admitting: Internal Medicine

## 2021-12-19 NOTE — Telephone Encounter (Signed)
Pt stated she has been having on going dizziness for 30 years and has gotten progressively worse. She stated she was at an 80% today. Scheduled her tomorrow with Lovena Le, and transferred to triage in case she needed to be seen sooner.

## 2021-12-19 NOTE — Telephone Encounter (Signed)
Awaiting triage note.  

## 2021-12-20 ENCOUNTER — Ambulatory Visit: Payer: Medicare PPO | Admitting: Family Medicine

## 2021-12-20 ENCOUNTER — Encounter: Payer: Self-pay | Admitting: Family Medicine

## 2021-12-20 VITALS — BP 128/79 | HR 84 | Ht 63.5 in | Wt 144.8 lb

## 2021-12-20 DIAGNOSIS — R42 Dizziness and giddiness: Secondary | ICD-10-CM | POA: Diagnosis not present

## 2021-12-20 MED ORDER — MECLIZINE HCL 25 MG PO TABS: 25.0000 mg | ORAL_TABLET | Freq: Three times a day (TID) | ORAL | 0 refills | Status: DC | PRN

## 2021-12-20 NOTE — Progress Notes (Signed)
Acute Office Visit  Subjective:    Patient ID: Cynthia Thornton, female    DOB: [birth year], 76 y.o.   MRN: [id number]  CC: vertigo flare   HPI Patient is in today for vertigo/dizziness.   Reports history of vertigo - for the past 20 years or so she will have a 24-hour episode every couple of years. Her last episode was mid-January (last month) and lingered for multiple days. Current episode started 5 days ago. It was severe for the first few days with vomiting for the first 6 hours. Symptoms have started improving but are still lingering mildly. She called her former ENT, but they stated she needed a new referral for vertigo so she is here requesting that.  [facility], Dr. Redmond Baseman, [address]   DIZZINESS Duration: days Description of symptoms: off kilter, originally with room spinning for >24 hours Duration of episode: hours Frequency of symptoms: recurrent Provoking factors:  head movement Aggravating factors:   movement, turning in bed Triggered by rolling over in bed: yes Triggered by bending over: yes Aggravated by head movement: yes Aggravated by exertion, coughing, loud noises: no Recent head injury: no Recent or current viral symptoms: no History of vasovagal episodes: no Nausea: yes Vomiting: yes Tinnitus: yes Hearing loss: no Aural fullness: no Headache: yes first few days  Photophobia/phonophobia: yest Unsteady gait: no Postural instability: yes Diplopia, dysarthria, dysphagia or weakness: no Related to exertion: no Pallor: no Diaphoresis: no Dyspnea: no Chest pain: no      Past Medical History:  Diagnosis Date   Arthritis    Breast cancer, left [facility]) oncologist-- dr [name]   dx 09-26-2017--- IDC, Stage IA, Grade 2, ER/PR +;  HER2 negative;  s/p  breast lumpectomy w/ node dissection 11-02-2017;  completed radiation 01-09-2018 (left breast genetic screening panel 2017 with variant of unknown significance AXINA)   COPD (chronic obstructive  pulmonary disease) ([place])    pulmologist-- dr Dillard Essex-- w/ chronic lingular scarring  (last exceratbation bronchiectasis 07/ 2020)   Dyslipidemia    followed by cardiology/ vascular-- dr g. adams ([facility] in [place])  family history strokes   Eczema    Family history of breast cancer    Family history of colon cancer    GERD (gastroesophageal reflux disease)    Hiatal hernia    History of esophageal stricture    s/p dilatation 2008   History of external beam radiation therapy    left breast 12-19-2017  to 01-09-2018   History of hyperthyroidism    endocrinologist-- (lov 04-11-2019 epic) dr Loanne Drilling, dx 2009 due to multinodular goiter ,  had taken medication for few months 2010 stopped due to norma TFT;     Insomnia    Multinodular goiter last thyroid ultrasound in epic 12-18-2018   endocrinologist-- dr Loanne Drilling,  dx 2009 w/ hyperthroidism with medication until normal TFT in 2010;     Osteopenia 02/2018   T score -2.4 distal third of radius.  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   Wears contact lenses     Past Surgical History:  Procedure Laterality Date  BREAST LUMPECTOMY WITH RADIOACTIVE SEED AND SENTINEL LYMPH NODE BIOPSY Left 11/02/2017   Procedure: LEFT BREAST LUMPECTOMY WITH RADIOACTIVE SEED AND LEFT AXILLARY DEEP SENTINEL LYMPH NODE BIOPSY, INJECT BLUE DYE LEFT BREAST;  Surgeon: [name], MD;  Location: [place];  Service: General;  Laterality: Left;   BREAST SURGERY  2000   Breast cyst removed, left   LAPAROSCOPIC BILATERAL SALPINGO OOPHERECTOMY Bilateral 10/13/2019   Procedure: LAPAROSCOPIC  BILATERAL SALPINGO OOPHORECTOMY LYSIS OF ADHESIONS PERITONEAL WASHINGS ;  Surgeon: [name], MD;  Location: [place];  Service: Gynecology;  Laterality: Bilateral;  request 7:30am OR time in [place] block requests one hour   TONSILLECTOMY  age 49   TUBAL LIGATION Bilateral yrs ago   VAGINAL HYSTERECTOMY  1991   Right ovarian cystectomy   VIDEO BRONCHOSCOPY Bilateral 03/16/2015   Procedure: VIDEO BRONCHOSCOPY WITHOUT FLUORO;  Surgeon: Brand Males, MD;  Location: [facility] ENDOSCOPY;  Service: Endoscopy;  Laterality: Bilateral;    Family History  Problem Relation Age of Onset   Osteoporosis Mother    Lung cancer Mother    Stroke Mother    Diabetes Father    Hypertension Father    Osteoporosis Father    Colon cancer Father 57   Breast cancer Sister        Age 26   Ovarian cancer Maternal Aunt 67   Breast cancer Cousin        maternal-Age 26   Colon cancer Paternal Uncle 17   Colon cancer Cousin        maternal first cousin   Breast cancer Maternal Aunt        great aunt- Age unknown   Crohn's disease Son    Leukemia Maternal Grandfather     Social History   Socioeconomic History   Marital status: Single    Spouse name: Not on file   Number of children: 2   Years of education: Not on file   Highest education level: Not on file  Occupational History   Occupation: Pharmacist, hospital, works part time     Fish farm manager: [employer]  Tobacco Use   Smoking status: Former    Packs/day: 0.10    Years: 30.00    Pack years: 3.00    Types: Cigarettes    Quit date: 10/31/1995    Years since quitting: 26.1   Smokeless tobacco: Never  Vaping Use   Vaping Use: Never used  Substance and Sexual Activity   Alcohol use: Yes    Alcohol/week: 0.0 standard drinks    Comment: seldom   Drug use: No   Sexual activity: Not Currently    Birth control/protection: Surgical    Comment: 1st intercourse 76 yo-Fewer than 5 partners  Other Topics Concern   Not on file   Social History Narrative   Lives by herself   2 children, one has mental issues    Social Determinants of Radio broadcast assistant Strain: Low Risk    Difficulty of Paying Living Expenses: Not hard at all  Food Insecurity: No Food Insecurity   Worried About Charity fundraiser in the Last Year: Never true   [place] in the Last Year: Never true  Transportation Needs: No Transportation Needs   Lack of Transportation (Medical): No   Lack of Transportation (Non-Medical): No  Physical Activity: Inactive   Days of Exercise per Week: 0 days   Minutes of Exercise per Session: 0 min  Stress: No Stress  Concern Present   Feeling of Stress : Not at all  Social Connections: Moderately Integrated   Frequency of Communication with Friends and Family: More than three times a week   Frequency of Social Gatherings with Friends and Family: More than three times a week   Attends Religious Services: More than 4 times per year   Active Member of [employer] or Organizations: Yes   Attends Music therapist: More than 4 times per year   Marital Status: Never married  Human resources officer Violence: Not At Risk   Fear of Current or Ex-Partner: No   Emotionally Abused: No   Physically Abused: No   Sexually Abused: No    Outpatient Medications Prior to Visit  Medication Sig Dispense Refill   aspirin EC 81 MG tablet Take 81 mg by mouth daily.     B Complex-C (B-COMPLEX WITH VITAMIN C) tablet Take 1 tablet by mouth daily.     cetirizine (ZYRTEC) 10 MG tablet Take 10 mg by mouth daily.     Cholecalciferol (VITAMIN D) 2000 units CAPS Take 2,000 Units by mouth daily.     clorazepate (TRANXENE) 7.5 MG tablet Take 1 tablet (7.5 mg total) by mouth at bedtime as needed for anxiety. 30 tablet 2   CRANBERRY PO Take 2 tablets by mouth in the morning and at bedtime.     famotidine (PEPCID) 20 MG tablet Take 1 tablet (20 mg total) by mouth daily. **PLEASE CONTACT OFFICE TO SCHEDULE FOLLOW UP 90 tablet  0   letrozole (FEMARA) 2.5 MG tablet Take 0.5 tablets (1.25 mg total) by mouth daily. 90 tablet 1   methenamine (HIPREX) 1 g tablet Take 1 g by mouth 2 (two) times daily with a meal. Take with 551m of vitamin c w/ each tablet     omeprazole (PRILOSEC) 40 MG capsule TAKE 1 CAPSULE BY MOUTH EVERY DAY **PLEASE CONTACT OFFICE TO SCHEDULE FOLLOW UP 90 capsule 0   Probiotic Product (PROBIOTIC PO) Take by mouth.     amoxicillin-clavulanate (AUGMENTIN) 875-125 MG tablet Take 1 tablet by mouth 2 (two) times daily. 10 tablet 0   No facility-administered medications prior to visit.    Allergies  Allergen Reactions   Doxycycline Rash   Other Other (See Comments)    Surgical glue : hives   Oxycodone Nausea And Vomiting   Sulfa Antibiotics Hives   Trimethoprim Sulfate [Trimethoprim]     rash   Codeine Nausea Only   Propranolol Hcl Other (See Comments)    "Dizziness and heart racing"    Review of Systems All review of systems negative except what is listed in the HPI     Objective:    Physical Exam Vitals reviewed.  Constitutional:      Appearance: Normal appearance. She is normal weight.  HENT:     Head: Normocephalic and atraumatic.     Comments: Dix-Hallpike without nystagmus, but did reproduce symptoms bilaterally Eyes:     Extraocular Movements: Extraocular movements intact.     Conjunctiva/sclera: Conjunctivae normal.     Pupils: Pupils are equal, round, and reactive to light.  Cardiovascular:     Rate and Rhythm: Normal rate and regular rhythm.  Pulmonary:     Effort: Pulmonary effort is normal.     Breath sounds: Normal breath sounds.  Musculoskeletal:        General: Normal range of motion.     Cervical back: Normal range of motion and neck supple.  Skin:    General: Skin  is warm and dry.  Neurological:     General: No focal deficit present.     Mental Status: She is alert and oriented to person, place, and time. Mental status is at baseline.  Psychiatric:         Mood and Affect: Mood normal.        Behavior: Behavior normal.        Thought Content: Thought content normal.        Judgment: Judgment normal.    BP 140/80    Pulse 84    Ht 5' 3.5" (1.613 m)    Wt 144 lb 12.8 oz (65.7 kg)    BMI 25.25 kg/m  Wt Readings from Last 3 Encounters:  12/20/21 144 lb 12.8 oz (65.7 kg)  09/12/21 145 lb 12.8 oz (66.1 kg)  08/22/21 143 lb 9.6 oz (65.1 kg)    Health Maintenance Due  Topic Date Due   COVID-19 Vaccine (5 - Booster for Moderna series) 07/14/2021    There are no preventive care reminders to display for this patient.   Lab Results  Component Value Date   TSH 3.00 01/07/2021   Lab Results  Component Value Date   WBC 5.7 01/07/2021   HGB 12.2 01/07/2021   HCT 35.2 (L) 01/07/2021   MCV 90.6 01/07/2021   PLT 258.0 01/07/2021   Lab Results  Component Value Date   NA 134 (L) 01/07/2021   K 4.9 01/07/2021   CHLORIDE 100 10/03/2017   CO2 27 01/07/2021   GLUCOSE 85 01/07/2021   BUN 21 01/07/2021   CREATININE 0.95 01/07/2021   BILITOT 0.4 01/07/2021   ALKPHOS 83 01/07/2021   AST 16 01/07/2021   ALT 14 01/07/2021   PROT 6.4 01/07/2021   ALBUMIN 4.2 01/07/2021   CALCIUM 9.2 01/07/2021   ANIONGAP 15 05/18/2020   EGFR 57 (L) 10/03/2017   GFR 59.74 (L) 01/07/2021   Lab Results  Component Value Date   CHOL 175 01/07/2021   Lab Results  Component Value Date   HDL 86.80 01/07/2021   Lab Results  Component Value Date   LDLCALC 69 01/07/2021   Lab Results  Component Value Date   TRIG 94.0 01/07/2021   Lab Results  Component Value Date   CHOLHDL 2 01/07/2021   Lab Results  Component Value Date   HGBA1C 5.3 12/26/2019       Assessment & Plan:   1. Vertigo Discussed trying occasional meclizine - educated on safety of this medication in the elderly. Adding Epley maneuver handout to AVS. ENT referral placed at patient request. Patient aware of signs/symptoms requiring further/urgent evaluation.   - Ambulatory referral  to ENT - meclizine (ANTIVERT) 25 MG tablet; Take 1 tablet (25 mg total) by mouth 3 (three) times daily as needed for dizziness.  Dispense: 30 tablet; Refill: 0   Follow-up if symptoms worsen or fail to improve.   Terrilyn Saver, NP

## 2021-12-29 ENCOUNTER — Encounter: Payer: Self-pay | Admitting: Physician Assistant

## 2021-12-29 ENCOUNTER — Ambulatory Visit: Payer: Medicare PPO | Admitting: Physician Assistant

## 2021-12-29 VITALS — BP 108/64 | HR 70 | Ht 63.0 in | Wt 145.2 lb

## 2021-12-29 DIAGNOSIS — Z800 Family history of malignant neoplasm of digestive organs: Secondary | ICD-10-CM | POA: Diagnosis not present

## 2021-12-29 DIAGNOSIS — K219 Gastro-esophageal reflux disease without esophagitis: Secondary | ICD-10-CM

## 2021-12-29 MED ORDER — FAMOTIDINE 20 MG PO TABS: 20.0000 mg | ORAL_TABLET | Freq: Every day | ORAL | 3 refills | Status: DC

## 2021-12-29 MED ORDER — OMEPRAZOLE 40 MG PO CPDR: 40.0000 mg | DELAYED_RELEASE_CAPSULE | Freq: Every day | ORAL | 3 refills | Status: DC

## 2021-12-29 NOTE — Progress Notes (Signed)
Chief Complaint: Follow-up GERD  HPI:    Cynthia Thornton is a 76 year old female, known to Dr. Hilarie Fredrickson, with past medical history of breast cancer, COPD, GERD and a family history of colon cancer, who presents to clinic today for follow-up of her reflux and medication refills.    09/18/2012 EGD was normal.    05/29/2019 colonoscopy with small internal hemorrhoids and otherwise normal.  Repeat recommended in 5 years given family history.    08/27/2020 office visit with Tye Savoy for GERD, at that time doing well on her Omeprazole and Pepcid at night.  Supplied refills.    Today, patient tells me as long as she does not miss more than 2 or 3 days of her medicine she does not have any breakthrough heartburn or reflux.  She continues on Omeprazole 40 mg every morning and Famotidine 20 mg nightly.    Does ask some questions about when her next colonoscopy is due.  She does not want to miss any surveillance because she is now in her 69s and that is when both of her family members were diagnosed.    Denies fever, chills, weight loss, change in bowel habits, abdominal pain or symptoms that awaken her from sleep.  Past Medical History:  Diagnosis Date   Arthritis    Breast cancer, left [facility]) oncologist-- dr [name]   dx 09-26-2017--- IDC, Stage IA, Grade 2, ER/PR +;  HER2 negative;  s/p  breast lumpectomy w/ node dissection 11-02-2017;  completed radiation 01-09-2018 (left breast genetic screening panel 2017 with variant of unknown significance AXINA)   COPD (chronic obstructive pulmonary disease) ([place])    pulmologist-- dr Dillard Essex-- w/ chronic lingular scarring  (last exceratbation bronchiectasis 07/ 2020)   Dyslipidemia    followed by cardiology/ vascular-- dr g. adams ([facility] in [place])  family history strokes   Eczema    Family history of breast cancer    Family history of colon cancer    GERD (gastroesophageal reflux disease)    Hiatal hernia    History of esophageal  stricture    s/p dilatation 2008   History of external beam radiation therapy    left breast 12-19-2017  to 01-09-2018   History of hyperthyroidism    endocrinologist-- (lov 04-11-2019 epic) dr Loanne Drilling, dx 2009 due to multinodular goiter ,  had taken medication for few months 2010 stopped due to norma TFT;     Insomnia    Multinodular goiter last thyroid ultrasound in epic 12-18-2018   endocrinologist-- dr Loanne Drilling,  dx 2009 w/ hyperthroidism with medication until normal TFT in 2010;     Osteopenia 02/2018   T score -2.4 distal third of radius.  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   Wears contact lenses     Past Surgical History:  Procedure Laterality Date   BREAST LUMPECTOMY WITH RADIOACTIVE SEED AND SENTINEL LYMPH NODE BIOPSY Left 11/02/2017   Procedure: LEFT BREAST LUMPECTOMY WITH RADIOACTIVE SEED AND LEFT AXILLARY DEEP SENTINEL LYMPH NODE BIOPSY, INJECT BLUE DYE LEFT BREAST;  Surgeon: [name], MD;  Location: [place];  Service:  General;  Laterality: Left;   BREAST SURGERY  2000   Breast cyst removed, left   LAPAROSCOPIC BILATERAL SALPINGO OOPHERECTOMY Bilateral 10/13/2019   Procedure: LAPAROSCOPIC BILATERAL SALPINGO OOPHORECTOMY LYSIS OF ADHESIONS PERITONEAL WASHINGS ;  Surgeon: [name], MD;  Location: [place];  Service: Gynecology;  Laterality: Bilateral;  request 7:30am OR time in [place] block requests one hour   TONSILLECTOMY  age 51   TUBAL LIGATION Bilateral yrs ago   VAGINAL HYSTERECTOMY  1991   Right ovarian cystectomy   VIDEO BRONCHOSCOPY Bilateral 03/16/2015   Procedure:  VIDEO BRONCHOSCOPY WITHOUT FLUORO;  Surgeon: Brand Males, MD;  Location: [facility];  Service: Endoscopy;  Laterality: Bilateral;    Current Outpatient Medications  Medication Sig Dispense Refill   aspirin EC 81 MG tablet Take 81 mg by mouth daily.     B Complex-C (B-COMPLEX WITH VITAMIN C) tablet Take 1 tablet by mouth daily.     cetirizine (ZYRTEC) 10 MG tablet Take 10 mg by mouth daily.     Cholecalciferol (VITAMIN D) 2000 units CAPS Take 2,000 Units by mouth daily.     clorazepate (TRANXENE) 7.5 MG tablet Take 1 tablet (7.5 mg total) by mouth at bedtime as needed for anxiety. 30 tablet 2   CRANBERRY PO Take 2 tablets by mouth in the morning and at bedtime.     letrozole (FEMARA) 2.5 MG tablet Take 0.5 tablets (1.25 mg total) by mouth daily. 90 tablet 1   meclizine (ANTIVERT) 25 MG tablet Take 1 tablet (25 mg total) by mouth 3 (three) times daily as needed for dizziness. 30 tablet 0   methenamine (HIPREX) 1 g tablet Take 1 g by mouth 2 (two) times daily with a meal. Take with 564m of vitamin c w/ each tablet     Probiotic Product (PROBIOTIC PO) Take by mouth.     famotidine (PEPCID) 20 MG tablet Take 1 tablet (20 mg total) by mouth daily. 90 tablet 3   omeprazole (PRILOSEC) 40 MG capsule Take 1 capsule (40 mg total) by mouth daily. 90 capsule 3   No current facility-administered medications for this visit.    Allergies as of 12/29/2021 - Review Complete 12/20/2021  Allergen Reaction Noted   Doxycycline Rash 05/19/2019   Other Other (See Comments) 04/23/2020   Oxycodone Nausea And Vomiting 05/05/2019   Sulfa antibiotics Hives 04/23/2020   Trimethoprim sulfate [trimethoprim]  11/05/2019   Codeine Nausea Only 08/31/2011   Propranolol hcl Other (See Comments)     Family History  Problem Relation Age of Onset   Osteoporosis Mother    Lung cancer Mother    Stroke Mother    Diabetes Father    Hypertension Father    Osteoporosis Father    Colon cancer Father 744  Breast  cancer Sister        Age 74  Ovarian cancer Maternal Aunt 651  Breast cancer Cousin        maternal-Age 74  Colon cancer Paternal Uncle 730  Colon cancer Cousin        maternal first cousin   Breast cancer Maternal Aunt        great aunt- Age unknown   Crohn's disease Son    Leukemia Maternal Grandfather     Social History   Socioeconomic History   Marital status: Single    Spouse name: Not on file   Number of children: 2   Years of education: Not on file  Highest education level: Not on file  Occupational History   Occupation: Pharmacist, hospital, works part time     Employer: [employer]  Tobacco Use   Smoking status: Former    Packs/day: 0.10    Years: 30.00    Pack years: 3.00    Types: Cigarettes    Quit date: 10/31/1995    Years since quitting: 26.1   Smokeless tobacco: Never  Vaping Use   Vaping Use: Never used  Substance and Sexual Activity   Alcohol use: Yes    Alcohol/week: 0.0 standard drinks    Comment: seldom   Drug use: No   Sexual activity: Not Currently    Birth control/protection: Surgical    Comment: 1st intercourse 76 yo-Fewer than 5 partners  Other Topics Concern   Not on file  Social History Narrative   Lives by herself   2 children, one has mental issues    Social Determinants of Radio broadcast assistant Strain: Low Risk    Difficulty of Paying Living Expenses: Not hard at all  Food Insecurity: No Food Insecurity   Worried About Charity fundraiser in the Last Year: Never true   [place] in the Last Year: Never true  Transportation Needs: No Transportation Needs   Lack of Transportation (Medical): No   Lack of Transportation (Non-Medical): No  Physical Activity: Inactive   Days of Exercise per Week: 0 days   Minutes of Exercise per Session: 0 min  Stress: No Stress Concern Present   Feeling of Stress : Not at all  Social Connections: Moderately Integrated   Frequency of Communication with Friends and Family: More than  three times a week   Frequency of Social Gatherings with Friends and Family: More than three times a week   Attends Religious Services: More than 4 times per year   Active Member of [employer] or Organizations: Yes   Attends Music therapist: More than 4 times per year   Marital Status: Never married  Human resources officer Violence: Not At Risk   Fear of Current or Ex-Partner: No   Emotionally Abused: No   Physically Abused: No   Sexually Abused: No    Review of Systems:    Constitutional: No weight loss, fever or chills Cardiovascular: No chest pain Respiratory: No SOB  Gastrointestinal: See HPI and otherwise negative   Physical Exam:  Vital signs: BP 108/64    Pulse 70    Ht 5' 3"  (1.6 m)    Wt 145 lb 3.2 oz (65.9 kg)    BMI 25.72 kg/m   Constitutional:   Pleasant Elderly Caucasian female appears to be in NAD, Well developed, Well nourished, alert and cooperative Respiratory: Respirations even and unlabored. Lungs clear to auscultation bilaterally.   No wheezes, crackles, or rhonchi.  Cardiovascular: Normal S1, S2. No MRG. Regular rate and rhythm. No peripheral edema, cyanosis or pallor.  Gastrointestinal:  Soft, nondistended, nontender. No rebound or guarding. Normal bowel sounds. No appreciable masses or hepatomegaly. Rectal:  Not performed.  Psychiatric: Demonstrates good judgement and reason without abnormal affect or behaviors.  RELEVANT LABS AND IMAGING: CBC    Component Value Date/Time   WBC 5.7 01/07/2021 1141   RBC 3.89 01/07/2021 1141   HGB 12.2 01/07/2021 1141   HGB 13.0 10/03/2017 0837   HCT 35.2 (L) 01/07/2021 1141   HCT 37.6 10/03/2017 0837   PLT 258.0 01/07/2021 1141   PLT 305 10/03/2017 0837   MCV 90.6  01/07/2021 1141   MCV 88.3 10/03/2017 0837   MCH 30.2 08/13/2020 0957   MCHC 34.6 01/07/2021 1141   RDW 13.1 01/07/2021 1141   RDW 13.4 10/03/2017 0837   LYMPHSABS 1.7 01/07/2021 1141   LYMPHSABS 1.5 10/03/2017 0837   MONOABS 0.6 01/07/2021 1141    MONOABS 0.5 10/03/2017 0837   EOSABS 0.2 01/07/2021 1141   EOSABS 0.2 10/03/2017 0837   BASOSABS 0.1 01/07/2021 1141   BASOSABS 0.1 10/03/2017 0837    CMP     Component Value Date/Time   NA 134 (L) 01/07/2021 1141   NA 134 (L) 10/03/2017 0837   K 4.9 01/07/2021 1141   K 4.3 10/03/2017 0837   CL 99 01/07/2021 1141   CO2 27 01/07/2021 1141   CO2 23 10/03/2017 0837   GLUCOSE 85 01/07/2021 1141   GLUCOSE 90 10/03/2017 0837   BUN 21 01/07/2021 1141   BUN 24.3 10/03/2017 0837   CREATININE 0.95 01/07/2021 1141   CREATININE 0.93 08/13/2020 0957   CREATININE 1.0 10/03/2017 0837   CALCIUM 9.2 01/07/2021 1141   CALCIUM 9.7 10/03/2017 0837   PROT 6.4 01/07/2021 1141   PROT 7.7 10/03/2017 0837   ALBUMIN 4.2 01/07/2021 1141   ALBUMIN 4.7 10/03/2017 0837   AST 16 01/07/2021 1141   AST 21 10/03/2017 0837   ALT 14 01/07/2021 1141   ALT 17 10/03/2017 0837   ALKPHOS 83 01/07/2021 1141   ALKPHOS 107 10/03/2017 0837   BILITOT 0.4 01/07/2021 1141   BILITOT 0.44 10/03/2017 0837   GFRNONAA >60 05/18/2020 1147   GFRAA >60 05/18/2020 1147    Assessment: 1.  GERD: Controlled on Omeprazole 40 mg every morning and Famotidine 20 mg nightly  2.  Family history of colon cancer: In her father and uncle in their 33s, patient's last colonoscopy in 2020 with recommendations to repeat in 5 years  Plan: 1.  Refilled Omeprazole 40 mg every morning #90 with 3 refills.  Discussed with patient that if she continues to do well in a year we can refill for another year without her being seen in clinic. 2.  Refilled Famotidine 20 mg nightly #90 with 3 refills.  Again if she is doing well in a year we can refill for another year. 3.  Discussed next colonoscopy is due in July 2025 4.  Patient to follow in clinic in the interim as needed.  Ellouise Newer, PA-C [facility] 12/29/2021, 3:11 PM  Cc: Colon Branch, MD

## 2021-12-29 NOTE — Patient Instructions (Addendum)
We have sent the following medications to your pharmacy for you to pick up at your convenience: Omeprazole & Famotidine. ? ?If you are age 74 or older, your body mass index should be between 23-30. Your Body mass index is 25.72 kg/m?Marland Kitchen If this is out of the aforementioned range listed, please consider follow up with your Primary Care Provider. ?________________________________________________________ ? ?The [facility] providers would like to encourage you to use [facility] to communicate with providers for non-urgent requests or questions.  Due to long hold times on the telephone, sending your provider a message by [facility] may be a faster and more efficient way to get a response.  Please allow 48 business hours for a response.  Please remember that this is for non-urgent requests.  ?_______________________________________________________ ? ?

## 2022-01-02 NOTE — Progress Notes (Signed)
Addendum: Reviewed and agree with assessment and management plan. Charlies Rayburn M, MD  

## 2022-01-13 ENCOUNTER — Telehealth: Payer: Medicare PPO | Admitting: Internal Medicine

## 2022-01-13 ENCOUNTER — Encounter: Payer: Medicare PPO | Admitting: Internal Medicine

## 2022-01-14 IMAGING — DX DG CHEST 1V PORT
1 series · 1 of 1 positions shown · non-contrast
Comparison: 05/30/2019

CLINICAL DATA: Cough

EXAM:
PORTABLE CHEST 1 VIEW

[chest ap]
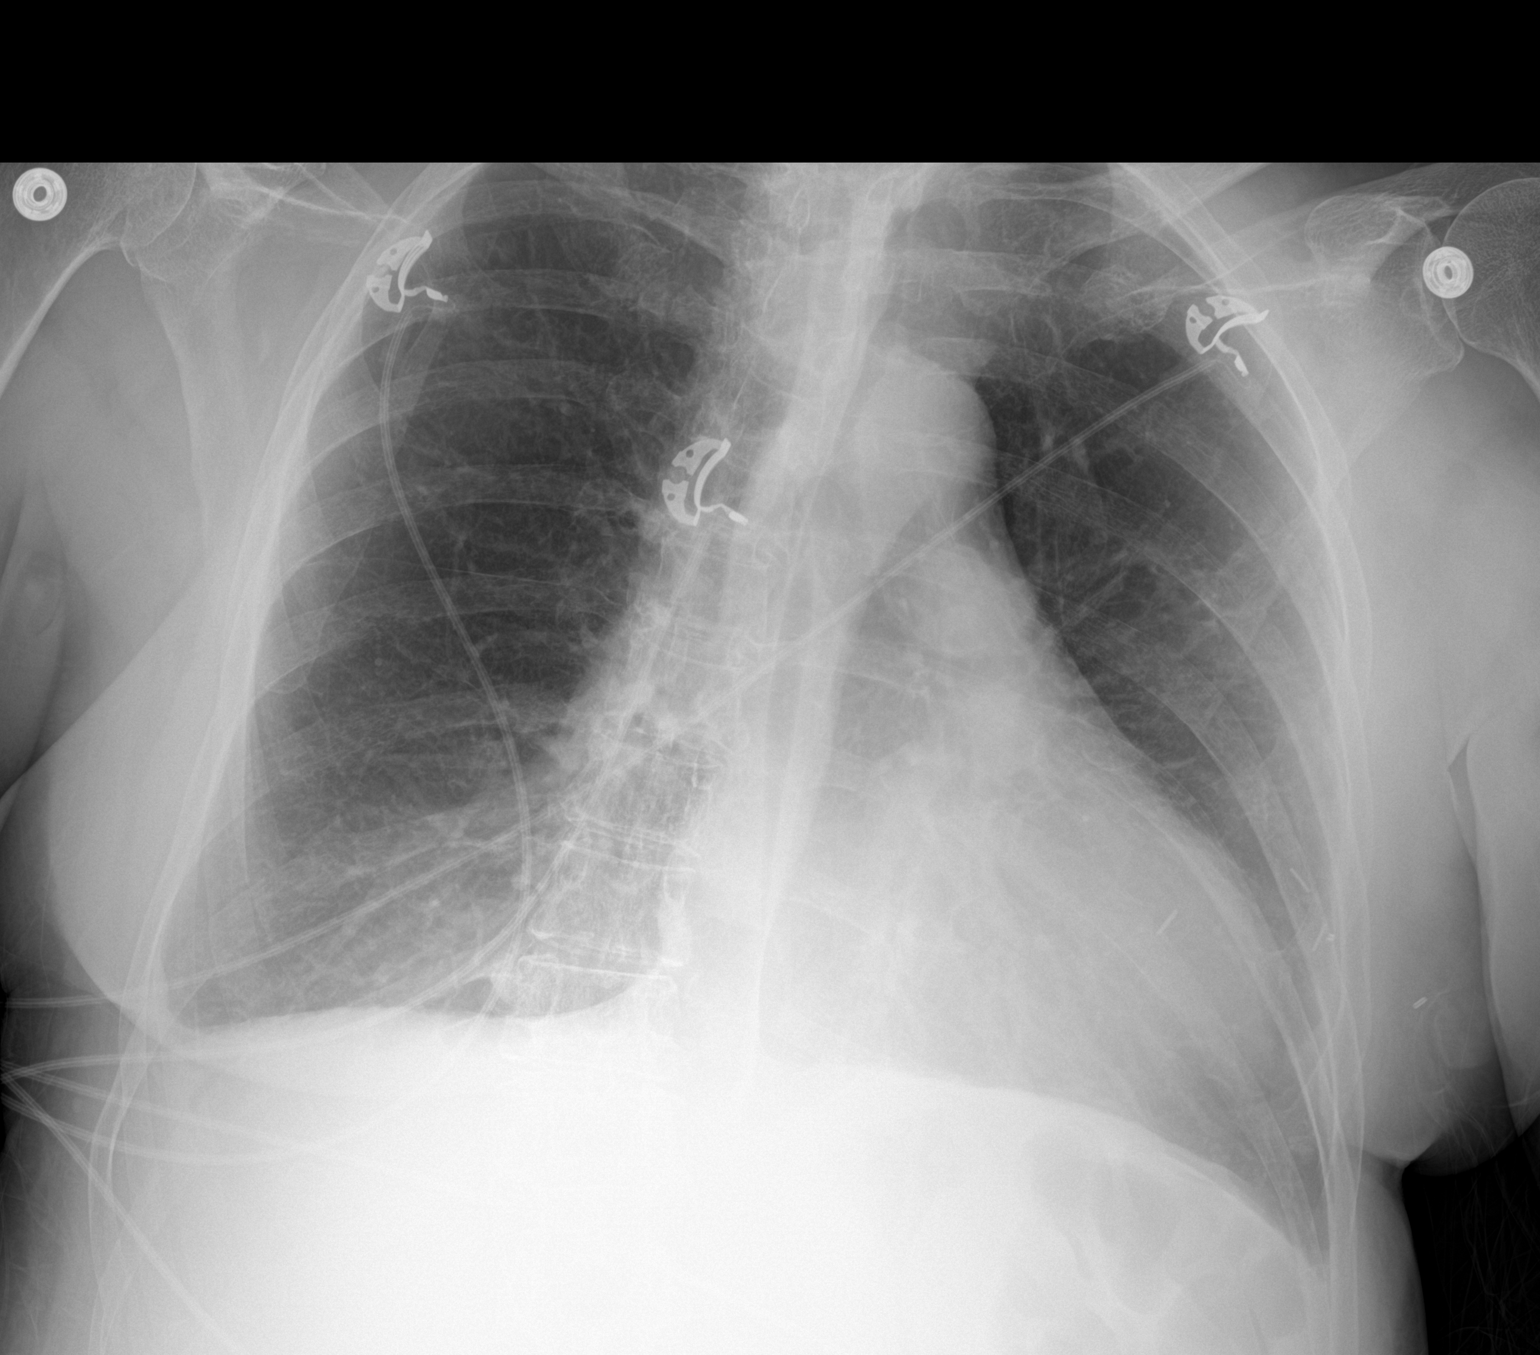

[1 of 1 positions shown; findings below may reference images not displayed]

FINDINGS: There is hyperinflation of the lungs compatible with COPD.
Cardiomegaly. No confluent opacities or effusions. No acute bony
abnormality.
IMPRESSION: Cardiomegaly, COPD.  No active disease.

## 2022-01-18 DIAGNOSIS — H52203 Unspecified astigmatism, bilateral: Secondary | ICD-10-CM | POA: Diagnosis not present

## 2022-01-18 DIAGNOSIS — H25013 Cortical age-related cataract, bilateral: Secondary | ICD-10-CM | POA: Diagnosis not present

## 2022-01-21 ENCOUNTER — Other Ambulatory Visit: Payer: Self-pay | Admitting: Hematology and Oncology

## 2022-01-23 DIAGNOSIS — M546 Pain in thoracic spine: Secondary | ICD-10-CM | POA: Diagnosis not present

## 2022-01-23 DIAGNOSIS — M542 Cervicalgia: Secondary | ICD-10-CM | POA: Diagnosis not present

## 2022-01-24 ENCOUNTER — Telehealth: Payer: Self-pay | Admitting: Internal Medicine

## 2022-01-24 NOTE — Telephone Encounter (Signed)
Left message for patient to call back and schedule Medicare Annual Wellness Visit (AWV) in office.  ? ?If not able to come in office, please offer to do virtually or by telephone.  Left office number and my jabber [phone]. ? ?Last AWV:01/06/2021 ? ?Please schedule at anytime with Nurse Health Advisor. ?  ?

## 2022-01-27 DIAGNOSIS — M542 Cervicalgia: Secondary | ICD-10-CM | POA: Diagnosis not present

## 2022-01-27 DIAGNOSIS — M546 Pain in thoracic spine: Secondary | ICD-10-CM | POA: Diagnosis not present

## 2022-02-06 DIAGNOSIS — L2089 Other atopic dermatitis: Secondary | ICD-10-CM | POA: Diagnosis not present

## 2022-02-06 DIAGNOSIS — L720 Epidermal cyst: Secondary | ICD-10-CM | POA: Diagnosis not present

## 2022-02-06 DIAGNOSIS — Z129 Encounter for screening for malignant neoplasm, site unspecified: Secondary | ICD-10-CM | POA: Diagnosis not present

## 2022-02-06 DIAGNOSIS — L218 Other seborrheic dermatitis: Secondary | ICD-10-CM | POA: Diagnosis not present

## 2022-02-07 ENCOUNTER — Ambulatory Visit: Payer: Medicare PPO | Admitting: Family

## 2022-02-07 VITALS — BP 116/61 | HR 72 | Temp 98.3°F | Resp 16 | Wt 145.0 lb

## 2022-02-07 DIAGNOSIS — H5789 Other specified disorders of eye and adnexa: Secondary | ICD-10-CM | POA: Diagnosis not present

## 2022-02-07 HISTORY — DX: Other specified disorders of eye and adnexa: H57.89

## 2022-02-07 MED ORDER — TOBRAMYCIN-DEXAMETHASONE 0.3-0.1 % OP SUSP: 2.0000 [drp] | Freq: Four times a day (QID) | OPHTHALMIC | 0 refills | Status: AC

## 2022-02-07 NOTE — Progress Notes (Signed)
? ?Subjective:  ? ?By signing my name below, I, Carylon Perches, attest that this documentation has been prepared under the direction and in the presence of Debbrah Alar NP, 02/07/2022   ? ? Patient ID: Cynthia Thornton, female    DOB: Jan 18, 1946, 76 y.o.   MRN: [id number] ? ?Chief Complaint  ?Patient presents with  ? Eye Pain  ?  Complains of left eye pain, redness and irritation.   ? ? ?HPI ?Patient is in today for an office visit. ? ?Redness of the left eye - She complains of redness in the lateral sclera of the left eye. She states that area of her left eye are painful. Symptoms began on 02/02/2022. She denies of any eye discharge, vision change, trauma or cold symptoms. She has seasonal allergies but symptoms are typically alleviated when she takes Zyrtec. She does have an eye doctor, but could not get in with them before she was scheduled to go out of town.  ? ? ?Health Maintenance Due  ?Topic Date Due  ? COVID-19 Vaccine (5 - Booster for Moderna series) 07/14/2021  ? ? ?Past Medical History:  ?Diagnosis Date  ? Arthritis   ? Breast cancer, left [facility]) oncologist-- dr [name]  ? dx 09-26-2017--- IDC, Stage IA, Grade 2, ER/PR +;  HER2 negative;  s/p  breast lumpectomy w/ node dissection 11-02-2017;  completed radiation 01-09-2018 (left breast genetic screening panel 2017 with variant of unknown significance AXINA)  ? COPD (chronic obstructive pulmonary disease) ([place])   ? pulmologist-- dr Dillard Essex-- w/ chronic lingular scarring  (last exceratbation bronchiectasis 07/ 2020)  ? Dyslipidemia   ? followed by cardiology/ vascular-- dr g. adams ([facility] in [place])  family history strokes  ? Eczema   ? Family history of breast cancer   ? Family history of colon cancer   ? GERD (gastroesophageal reflux disease)   ? Hiatal hernia   ? History of esophageal stricture   ? s/p dilatation 2008  ? History of external beam radiation therapy   ? left breast 12-19-2017  to 01-09-2018  ? History of  hyperthyroidism   ? endocrinologist-- (lov 04-11-2019 epic) dr Loanne Drilling, dx 2009 due to multinodular goiter ,  had taken medication for few months 2010 stopped due to norma TFT;    ? Insomnia   ? Multinodular goiter last thyroid ultrasound in epic 12-18-2018  ? endocrinologist-- dr Loanne Drilling,  dx 2009 w/ hyperthroidism with medication until normal TFT in 2010;    ? Osteopenia 02/2018  ? T score -2.4 distal third of radius.  FRAX 11% / 1.8% stable at other points of interest to include spine, right and left hip  ? Personal history of colonic polyps 09/06/2001  ? hyperplastic  ? Pityriasis rosea   ? PONV (postoperative nausea and vomiting)   ? severe  ? Primary osteoarthritis of left knee   ? Severe Patellofemoral arthritis  ? Rhomboid pain   ? Right and right trapezius with concomitant cervical spondylosis  ? Wears contact lenses   ? ? ?Past Surgical History:  ?Procedure Laterality Date  ? BREAST LUMPECTOMY WITH RADIOACTIVE SEED AND SENTINEL LYMPH NODE BIOPSY Left 11/02/2017  ? Procedure: LEFT BREAST LUMPECTOMY WITH RADIOACTIVE SEED AND LEFT AXILLARY DEEP SENTINEL LYMPH NODE BIOPSY, INJECT BLUE DYE LEFT BREAST;  Surgeon: [name], MD;  Location: [place];  Service: General;  Laterality: Left;  ? BREAST SURGERY  2000  ? Breast cyst removed, left  ? LAPAROSCOPIC BILATERAL SALPINGO  OOPHERECTOMY Bilateral 10/13/2019  ? Procedure: LAPAROSCOPIC BILATERAL SALPINGO OOPHORECTOMY LYSIS OF ADHESIONS PERITONEAL WASHINGS ;  Surgeon: [name], MD;  Location: [place];  Service: Gynecology;  Laterality: Bilateral;  request 7:30am OR time in [place] block ?requests one hour  ? TONSILLECTOMY  age 40  ? TUBAL LIGATION Bilateral yrs ago  ? VAGINAL HYSTERECTOMY  1991  ? Right ovarian cystectomy  ? VIDEO BRONCHOSCOPY Bilateral 03/16/2015  ? Procedure: VIDEO BRONCHOSCOPY WITHOUT FLUORO;  Surgeon: Brand Males, MD;  Location: [facility] ENDOSCOPY;  Service: Endoscopy;  Laterality: Bilateral;   ? ? ?Family History  ?Problem Relation Age of Onset  ? Osteoporosis Mother   ? Lung cancer Mother   ? Stroke Mother   ? Diabetes Father   ? Hypertension Father   ? Osteoporosis Father   ? Colon cancer Father 1  ? Breast cancer Sister   ?     Age 59  ? Ovarian cancer Maternal Aunt 60  ? Breast cancer Cousin   ?     maternal-Age 82  ? Colon cancer Paternal Uncle 69  ? Colon cancer Cousin   ?     maternal first cousin  ? Breast cancer Maternal Aunt   ?     great aunt- Age unknown  ? Crohn's disease Son   ? Leukemia Maternal Grandfather   ? ? ?Social History  ? ?Socioeconomic History  ? Marital status: Single  ?  Spouse name: Not on file  ? Number of children: 2  ? Years of education: Not on file  ? Highest education level: Not on file  ?Occupational History  ? Occupation: Pharmacist, hospital, works part time   ?  Employer: [employer]  ?Tobacco Use  ? Smoking status: Former  ?  Packs/day: 0.10  ?  Years: 30.00  ?  Pack years: 3.00  ?  Types: Cigarettes  ?  Quit date: 10/31/1995  ?  Years since quitting: 26.2  ? Smokeless tobacco: Never  ?Vaping Use  ? Vaping Use: Never used  ?Substance and Sexual Activity  ? Alcohol use: Yes  ?  Alcohol/week: 0.0 standard drinks  ?  Comment: seldom  ? Drug use: No  ? Sexual activity: Not Currently  ?  Birth control/protection: Surgical  ?  Comment: 1st intercourse 76 yo-Fewer than 5 partners  ?Other Topics Concern  ? Not on file  ?Social History Narrative  ? Lives by herself  ? 2 children, one has mental issues   ? ?Social Determinants of Health  ? ?Financial Resource Strain: Not on file  ?Food Insecurity: Not on file  ?Transportation Needs: Not on file  ?Physical Activity: Not on file  ?Stress: Not on file  ?Social Connections: Not on file  ?Intimate Partner Violence: Not on file  ? ? ?Outpatient Medications Prior to Visit  ?Medication Sig Dispense Refill  ? aspirin EC 81 MG tablet Take 81 mg by mouth daily.    ? B Complex-C (B-COMPLEX WITH VITAMIN C) tablet Take 1 tablet by mouth daily.     ? cetirizine (ZYRTEC) 10 MG tablet Take 10 mg by mouth daily.    ? Cholecalciferol (VITAMIN D) 2000 units CAPS Take 2,000 Units by mouth daily.    ? clorazepate (TRANXENE) 7.5 MG tablet Take 1 tablet (7.5 mg total) by mouth at bedtime as needed for anxiety. 30 tablet 2  ? CRANBERRY PO Take 2 tablets by mouth in the morning and at bedtime.    ? famotidine (PEPCID)  20 MG tablet Take 1 tablet (20 mg total) by mouth daily. 90 tablet 3  ? letrozole (FEMARA) 2.5 MG tablet TAKE 1 TABLET BY MOUTH EVERY DAY 90 tablet 1  ? meclizine (ANTIVERT) 25 MG tablet Take 1 tablet (25 mg total) by mouth 3 (three) times daily as needed for dizziness. 30 tablet 0  ? methenamine (HIPREX) 1 g tablet Take 1 g by mouth 2 (two) times daily with a meal. Take with 577m of vitamin c w/ each tablet    ? omeprazole (PRILOSEC) 40 MG capsule Take 1 capsule (40 mg total) by mouth daily. 90 capsule 3  ? Probiotic Product (PROBIOTIC PO) Take by mouth.    ? ?No facility-administered medications prior to visit.  ? ? ?Allergies  ?Allergen Reactions  ? Doxycycline Rash  ? Other Other (See Comments)  ?  Surgical glue : hives  ? Oxycodone Nausea And Vomiting  ? Sulfa Antibiotics Hives  ? Trimethoprim Sulfate [Trimethoprim]   ?  rash  ? Codeine Nausea Only  ? Propranolol Hcl Other (See Comments)  ?  "Dizziness and heart racing"  ? ? ?Review of Systems  ?Constitutional:  Negative for fever.  ?Eyes:  Positive for pain (Left Eye) and redness (Left Eye). Negative for discharge.  ?Endo/Heme/Allergies:  Positive for environmental allergies.  ? ?   ?Objective:  ?  ?Physical Exam ?Constitutional:   ?   General: She is not in acute distress. ?   Appearance: Normal appearance. She is not ill-appearing.  ?HENT:  ?   Head: Normocephalic and atraumatic.  ?   Right Ear: External ear normal.  ?   Left Ear: External ear normal.  ?Eyes:  ?   Extraocular Movements: Extraocular movements intact.  ?   Pupils: Pupils are equal, round, and reactive to light.  ?   Comments:  Injection left lateral sclera   ?Cardiovascular:  ?   Rate and Rhythm: Normal rate.  ?Pulmonary:  ?   Effort: Pulmonary effort is normal.  ?   Breath sounds: No rales.  ?Skin: ?   General: Skin is warm and dry.  ?Neu

## 2022-02-07 NOTE — Assessment & Plan Note (Signed)
New.  Does not appear to be infected at this time.  However will plan empiric coverage with tobradex. She is advised to follow up with her eye doctor if symptoms worsen or if symptoms do not improve.  She verbalizes understanding and agrees to plan.  ?

## 2022-02-07 NOTE — Patient Instructions (Signed)
Please begin tobradex eye drops.  If symptoms worsen or if symptoms fail to improve, please follow up with your eye doctor.  ?

## 2022-02-10 ENCOUNTER — Encounter: Payer: Medicare PPO | Admitting: Internal Medicine

## 2022-02-15 ENCOUNTER — Encounter: Payer: Self-pay | Admitting: Endocrinology

## 2022-02-15 ENCOUNTER — Ambulatory Visit: Payer: Medicare PPO | Admitting: Endocrinology

## 2022-02-15 VITALS — BP 110/80 | HR 70 | Ht 63.0 in | Wt 145.0 lb

## 2022-02-15 DIAGNOSIS — E042 Nontoxic multinodular goiter: Secondary | ICD-10-CM | POA: Diagnosis not present

## 2022-02-15 NOTE — Patient Instructions (Addendum)
Let's recheck the ultrasound.  you will receive a phone call, about a day and time for an appointment.    ?Blood tests are requested for you today.  We'll let you know about the results.   ?most of the time, the blood may become abnormal again, but this may take many years.  Therefore, you should have thyroid blood tests each year.  ? ?

## 2022-02-15 NOTE — Progress Notes (Signed)
? ?Subjective:  ? ? Patient ID: Cynthia Thornton, female    DOB: 1946-08-18, 76 y.o.   MRN: [id number] ? ?HPI ?pt returns for f/u of hyperthyroidism (dx'ed 2009; [place] in 2020 showed multiple small nodules (not big enough to need f/u), were unchanged; she took thionamide rx for a few months in 2010 for suppressed TSH, but she went off due to normal TFT).  she does not notice the goiter.  pt states she feels well in general.   ?Past Medical History:  ?Diagnosis Date  ? Arthritis   ? Breast cancer, left [facility]) oncologist-- dr [name]  ? dx 09-26-2017--- IDC, Stage IA, Grade 2, ER/PR +;  HER2 negative;  s/p  breast lumpectomy w/ node dissection 11-02-2017;  completed radiation 01-09-2018 (left breast genetic screening panel 2017 with variant of unknown significance AXINA)  ? COPD (chronic obstructive pulmonary disease) ([place])   ? pulmologist-- dr Dillard Essex-- w/ chronic lingular scarring  (last exceratbation bronchiectasis 07/ 2020)  ? Dyslipidemia   ? followed by cardiology/ vascular-- dr g. adams ([facility] in [place])  family history strokes  ? Eczema   ? Family history of breast cancer   ? Family history of colon cancer   ? GERD (gastroesophageal reflux disease)   ? Hiatal hernia   ? History of esophageal stricture   ? s/p dilatation 2008  ? History of external beam radiation therapy   ? left breast 12-19-2017  to 01-09-2018  ? History of hyperthyroidism   ? endocrinologist-- (lov 04-11-2019 epic) dr Loanne Drilling, dx 2009 due to multinodular goiter ,  had taken medication for few months 2010 stopped due to norma TFT;    ? Insomnia   ? Multinodular goiter last thyroid ultrasound in epic 12-18-2018  ? endocrinologist-- dr Loanne Drilling,  dx 2009 w/ hyperthroidism with medication until normal TFT in 2010;    ? Osteopenia 02/2018  ? T score -2.4 distal third of radius.  FRAX 11% / 1.8% stable at other points of interest to include spine, right and left hip  ? Personal history of colonic polyps 09/06/2001  ? hyperplastic  ?  Pityriasis rosea   ? PONV (postoperative nausea and vomiting)   ? severe  ? Primary osteoarthritis of left knee   ? Severe Patellofemoral arthritis  ? Rhomboid pain   ? Right and right trapezius with concomitant cervical spondylosis  ? Wears contact lenses   ? ? ?Past Surgical History:  ?Procedure Laterality Date  ? BREAST LUMPECTOMY WITH RADIOACTIVE SEED AND SENTINEL LYMPH NODE BIOPSY Left 11/02/2017  ? Procedure: LEFT BREAST LUMPECTOMY WITH RADIOACTIVE SEED AND LEFT AXILLARY DEEP SENTINEL LYMPH NODE BIOPSY, INJECT BLUE DYE LEFT BREAST;  Surgeon: [name], MD;  Location: [place];  Service: General;  Laterality: Left;  ? BREAST SURGERY  2000  ? Breast cyst removed, left  ? LAPAROSCOPIC BILATERAL SALPINGO OOPHERECTOMY Bilateral 10/13/2019  ? Procedure: LAPAROSCOPIC BILATERAL SALPINGO OOPHORECTOMY LYSIS OF ADHESIONS PERITONEAL WASHINGS ;  Surgeon: [name], MD;  Location: [place];  Service: Gynecology;  Laterality: Bilateral;  request 7:30am OR time in [place] block ?requests one hour  ? TONSILLECTOMY  age 71  ? TUBAL LIGATION Bilateral yrs ago  ? VAGINAL HYSTERECTOMY  1991  ? Right ovarian cystectomy  ? VIDEO BRONCHOSCOPY Bilateral 03/16/2015  ? Procedure: VIDEO BRONCHOSCOPY WITHOUT FLUORO;  Surgeon: Brand Males, MD;  Location: [facility] ENDOSCOPY;  Service: Endoscopy;  Laterality: Bilateral;  ? ? ?Social History  ? ?Socioeconomic History  ?  Marital status: Single  ?  Spouse name: Not on file  ? Number of children: 2  ? Years of education: Not on file  ? Highest education level: Not on file  ?Occupational History  ? Occupation: Pharmacist, hospital, works part time   ?  Employer: [employer]  ?Tobacco Use  ? Smoking status: Former  ?  Packs/day: 0.10  ?  Years: 30.00  ?  Pack years: 3.00  ?  Types: Cigarettes  ?  Quit date: 10/31/1995  ?  Years since quitting: 26.3  ? Smokeless tobacco: Never  ?Vaping Use  ? Vaping Use: Never used  ?Substance and Sexual Activity  ?  Alcohol use: Yes  ?  Alcohol/week: 0.0 standard drinks  ?  Comment: seldom  ? Drug use: No  ? Sexual activity: Not Currently  ?  Birth control/protection: Surgical  ?  Comment: 1st intercourse 76 yo-Fewer than 5 partners  ?Other Topics Concern  ? Not on file  ?Social History Narrative  ? Lives by herself  ? 2 children, one has mental issues   ? ?Social Determinants of Health  ? ?Financial Resource Strain: Not on file  ?Food Insecurity: Not on file  ?Transportation Needs: Not on file  ?Physical Activity: Not on file  ?Stress: Not on file  ?Social Connections: Not on file  ?Intimate Partner Violence: Not on file  ? ? ?Current Outpatient Medications on File Prior to Visit  ?Medication Sig Dispense Refill  ? aspirin EC 81 MG tablet Take 81 mg by mouth daily.    ? B Complex-C (B-COMPLEX WITH VITAMIN C) tablet Take 1 tablet by mouth daily.    ? cetirizine (ZYRTEC) 10 MG tablet Take 10 mg by mouth daily.    ? Cholecalciferol (VITAMIN D) 2000 units CAPS Take 2,000 Units by mouth daily.    ? clorazepate (TRANXENE) 7.5 MG tablet Take 1 tablet (7.5 mg total) by mouth at bedtime as needed for anxiety. 30 tablet 2  ? CRANBERRY PO Take 2 tablets by mouth in the morning and at bedtime.    ? famotidine (PEPCID) 20 MG tablet Take 1 tablet (20 mg total) by mouth daily. 90 tablet 3  ? letrozole (FEMARA) 2.5 MG tablet TAKE 1 TABLET BY MOUTH EVERY DAY 90 tablet 1  ? meclizine (ANTIVERT) 25 MG tablet Take 1 tablet (25 mg total) by mouth 3 (three) times daily as needed for dizziness. 30 tablet 0  ? methenamine (HIPREX) 1 g tablet Take 1 g by mouth 2 (two) times daily with a meal. Take with $RemoveB'500mg'vzcmudHq$  of vitamin c w/ each tablet    ? omeprazole (PRILOSEC) 40 MG capsule Take 1 capsule (40 mg total) by mouth daily. 90 capsule 3  ? Probiotic Product (PROBIOTIC PO) Take by mouth.    ? ?No current facility-administered medications on file prior to visit.  ? ? ?Allergies  ?Allergen Reactions  ? Doxycycline Rash  ? Other Other (See Comments)  ?   Surgical glue : hives  ? Oxycodone Nausea And Vomiting  ? Sulfa Antibiotics Hives  ? Trimethoprim Sulfate [Trimethoprim]   ?  rash  ? Codeine Nausea Only  ? Propranolol Hcl Other (See Comments)  ?  "Dizziness and heart racing"  ? ? ?Family History  ?Problem Relation Age of Onset  ? Osteoporosis Mother   ? Lung cancer Mother   ? Stroke Mother   ? Diabetes Father   ? Hypertension Father   ? Osteoporosis Father   ? Colon cancer  Father 35  ? Breast cancer Sister   ?     Age 71  ? Ovarian cancer Maternal Aunt 60  ? Breast cancer Cousin   ?     maternal-Age 56  ? Colon cancer Paternal Uncle 70  ? Colon cancer Cousin   ?     maternal first cousin  ? Breast cancer Maternal Aunt   ?     great aunt- Age unknown  ? Crohn's disease Son   ? Leukemia Maternal Grandfather   ? ? ?BP 110/80 (BP Location: Right Arm, Patient Position: Sitting, Cuff Size: Normal)   Pulse 70   Ht _0  (1.6 m)   Wt 145 lb (65.8 kg)   SpO2 98%   BMI 25.69 kg/m?  ? ? ?Review of Systems ? ?   ?Objective:  ? Physical Exam ?VITAL SIGNS:  See vs page ?GENERAL: no distress ?NECK: There is no palpable thyroid enlargement.  No thyroid nodule is palpable.  No palpable lymphadenopathy at the anterior neck.   ? ?Lab Results  ?Component Value Date  ? TSH 3.56 02/15/2022  ? T4TOTAL 8.5 08/31/2011  ? ? ?   ?Assessment & Plan:  ?MNG, due for recheck.  Euthyroid.  No medication is needed ? ?

## 2022-02-16 ENCOUNTER — Ambulatory Visit
Admission: RE | Admit: 2022-02-16 | Discharge: 2022-02-16 | Disposition: A | Discharge status: Home / Self Care | Payer: Medicare PPO | Source: Ambulatory Visit | Attending: Endocrinology | Admitting: Endocrinology

## 2022-02-16 DIAGNOSIS — E042 Nontoxic multinodular goiter: Secondary | ICD-10-CM

## 2022-02-16 DIAGNOSIS — E041 Nontoxic single thyroid nodule: Secondary | ICD-10-CM | POA: Diagnosis not present

## 2022-02-16 LAB — T4, FREE: Free T4: 0.91 ng/dL (ref 0.60–1.60)

## 2022-02-16 LAB — TSH: TSH: 3.56 u[IU]/mL (ref 0.35–5.50)

## 2022-02-23 NOTE — Progress Notes (Incomplete)
? ?Patient Care Team: ?Colon Branch, MD as PCP - General ?Gaynelle Arabian, MD as Consulting Physician (Orthopedic Surgery) ?Nicholas Lose, MD as Consulting Physician (Hematology and Oncology) ?[name], MD as Consulting Physician (General Surgery) ?Fontaine, Belinda Block, MD (Inactive) as Consulting Physician (Gynecology) ?Brand Males, MD as Consulting Physician (Pulmonary Disease) ?Kyung Rudd, MD as Consulting Physician (Radiation Oncology) ?Gardenia Phlegm, NP as Nurse Practitioner (Hematology and Oncology) ?Ardis Hughs, MD as Attending Physician (Urology) ?Melida Quitter, MD as Consulting Physician (Otolaryngology) ? ?DIAGNOSIS: No diagnosis found. ? ?SUMMARY OF ONCOLOGIC HISTORY: ?Oncology History  ?Malignant neoplasm of lower-inner quadrant of left breast in female, estrogen receptor positive ([place])  ?09/26/2017 Initial Diagnosis  ? Screening detected left breast calcifications 1.1 cm span axilla negative, biopsy revealed grade 2 invasive ductal carcinoma with DCIS ER 100% PR 2%, HER-2 negative ratio 0.78, Ki-67 20%, T1CN0 stage Ia clinical stage AJCC 8 ? ?  ?11/02/2017 Surgery  ? Left Lumpectomy: IDC grade 2, 1.2 cm, 0/3 LN Neg, , ER 100% PR 2%, HER-2 negative ratio 0.78, Ki-67 20% T1c N0 Stage 1A ? ?  ?12/19/2017 - 01/09/2018 Radiation Therapy  ? Adjuvant radiation therapy ? ?  ?01/2018 -  Anti-estrogen oral therapy  ? Letrozole daily stopped because of depression and hot flashes and myalgias, Discontinued Oct 2019 and resumed Oct 2021 taking it 1/2 tab daily ?  ? ? ?CHIEF COMPLIANT: Follow-up of left breast cancer on surveillance   ? ?INTERVAL HISTORY: Cynthia Thornton is a 76 y.o. with above-mentioned history of left breast cancer treated with lumpectomy, radiation, and who could not tolerate antiestrogen therapy with letrozole. ? She presents to the clinic today for annual follow-up.  ?  ? ?ALLERGIES:  is allergic to doxycycline, other, oxycodone, sulfa antibiotics, trimethoprim sulfate  [trimethoprim], codeine, and propranolol hcl. ? ?MEDICATIONS:  ?Current Outpatient Medications  ?Medication Sig Dispense Refill  ? aspirin EC 81 MG tablet Take 81 mg by mouth daily.    ? B Complex-C (B-COMPLEX WITH VITAMIN C) tablet Take 1 tablet by mouth daily.    ? cetirizine (ZYRTEC) 10 MG tablet Take 10 mg by mouth daily.    ? Cholecalciferol (VITAMIN D) 2000 units CAPS Take 2,000 Units by mouth daily.    ? clorazepate (TRANXENE) 7.5 MG tablet Take 1 tablet (7.5 mg total) by mouth at bedtime as needed for anxiety. 30 tablet 2  ? CRANBERRY PO Take 2 tablets by mouth in the morning and at bedtime.    ? famotidine (PEPCID) 20 MG tablet Take 1 tablet (20 mg total) by mouth daily. 90 tablet 3  ? letrozole (FEMARA) 2.5 MG tablet TAKE 1 TABLET BY MOUTH EVERY DAY 90 tablet 1  ? meclizine (ANTIVERT) 25 MG tablet Take 1 tablet (25 mg total) by mouth 3 (three) times daily as needed for dizziness. 30 tablet 0  ? methenamine (HIPREX) 1 g tablet Take 1 g by mouth 2 (two) times daily with a meal. Take with $RemoveB'500mg'rzrSkSrj$  of vitamin c w/ each tablet    ? omeprazole (PRILOSEC) 40 MG capsule Take 1 capsule (40 mg total) by mouth daily. 90 capsule 3  ? Probiotic Product (PROBIOTIC PO) Take by mouth.    ? ?No current facility-administered medications for this visit.  ? ? ?PHYSICAL EXAMINATION: ?ECOG PERFORMANCE STATUS: {CHL ONC ECOG DT:[id number]} ? ?There were no vitals filed for this visit. ?There were no vitals filed for this visit. ? ?BREAST:*** No palpable masses or nodules in either right or left breasts.  No palpable axillary supraclavicular or infraclavicular adenopathy no breast tenderness or nipple discharge. (exam performed in the presence of a chaperone) ? ?LABORATORY DATA:  ?I have reviewed the data as listed ? ?  Latest Ref Rng & Units 01/07/2021  ? 11:41 AM 08/13/2020  ?  9:57 AM 05/19/2020  ?  3:11 PM  ?CMP  ?Glucose 70 - 99 mg/dL 85   84   94    ?BUN 6 - 23 mg/dL _0 ?Creatinine 0.40 - 1.20 mg/dL 0.95   0.93   1.03     ?Sodium 135 - 145 mEq/L 134   134   136    ?Potassium 3.5 - 5.1 mEq/L 4.9   5.2   4.2    ?Chloride 96 - 112 mEq/L 99   100   102    ?CO2 19 - 32 mEq/L _1 ?Calcium 8.4 - 10.5 mg/dL 9.2   9.8   9.4    ?Total Protein 6.0 - 8.3 g/dL 6.4    6.8    ?Total Bilirubin 0.2 - 1.2 mg/dL 0.4    0.4    ?Alkaline Phos 39 - 117 U/L 83    98    ?AST 0 - 37 U/L 16    23    ?ALT 0 - 35 U/L 14    23    ? ? ?Lab Results  ?Component Value Date  ? WBC 5.7 01/07/2021  ? HGB 12.2 01/07/2021  ? HCT 35.2 (L) 01/07/2021  ? MCV 90.6 01/07/2021  ? PLT 258.0 01/07/2021  ? NEUTROABS 3.2 01/07/2021  ? ? ?ASSESSMENT & PLAN:  ?No problem-specific Assessment & Plan notes found for this encounter. ? ? ? ?No orders of the defined types were placed in this encounter. ? ?The patient has a good understanding of the overall plan. she agrees with it. she will call with any problems that may develop before the next visit here. ?Total time spent: 30 mins including face to face time and time spent for planning, charting and co-ordination of care ? ? Suzzette Righter, CMA ?02/23/22 ? ?I Gardiner Coins am scribing for Dr. [name] ? ?***  ?  ?

## 2022-02-27 NOTE — Progress Notes (Signed)
Canceled per patient ?

## 2022-02-28 DIAGNOSIS — H8110 Benign paroxysmal vertigo, unspecified ear: Secondary | ICD-10-CM | POA: Diagnosis not present

## 2022-03-09 ENCOUNTER — Inpatient Hospital Stay: Payer: Medicare PPO | Admitting: Hematology and Oncology

## 2022-03-10 ENCOUNTER — Ambulatory Visit (INDEPENDENT_AMBULATORY_CARE_PROVIDER_SITE_OTHER): Payer: Medicare PPO | Admitting: Internal Medicine

## 2022-03-10 ENCOUNTER — Encounter: Payer: Self-pay | Admitting: Internal Medicine

## 2022-03-10 VITALS — BP 122/68 | HR 68 | Temp 98.1°F | Resp 16 | Ht 63.0 in | Wt 142.1 lb

## 2022-03-10 DIAGNOSIS — R42 Dizziness and giddiness: Secondary | ICD-10-CM | POA: Diagnosis not present

## 2022-03-10 DIAGNOSIS — Z0000 Encounter for general adult medical examination without abnormal findings: Secondary | ICD-10-CM | POA: Diagnosis not present

## 2022-03-10 DIAGNOSIS — Z0001 Encounter for general adult medical examination with abnormal findings: Secondary | ICD-10-CM

## 2022-03-10 DIAGNOSIS — E871 Hypo-osmolality and hyponatremia: Secondary | ICD-10-CM

## 2022-03-10 DIAGNOSIS — J479 Bronchiectasis, uncomplicated: Secondary | ICD-10-CM | POA: Diagnosis not present

## 2022-03-10 DIAGNOSIS — E7800 Pure hypercholesterolemia, unspecified: Secondary | ICD-10-CM

## 2022-03-10 DIAGNOSIS — E785 Hyperlipidemia, unspecified: Secondary | ICD-10-CM | POA: Diagnosis not present

## 2022-03-10 LAB — COMPREHENSIVE METABOLIC PANEL
ALT: 14 U/L (ref 0–35)
AST: 19 U/L (ref 0–37)
Albumin: 4.6 g/dL (ref 3.5–5.2)
Alkaline Phosphatase: 86 U/L (ref 39–117)
BUN: 14 mg/dL (ref 6–23)
CO2: 28 mEq/L (ref 19–32)
Calcium: 9.6 mg/dL (ref 8.4–10.5)
Chloride: 96 mEq/L (ref 96–112)
Creatinine, Ser: 1 mg/dL (ref 0.40–1.20)
GFR: 55.71 mL/min — ABNORMAL LOW (ref >60.00)
Glucose, Bld: 89 mg/dL (ref 70–99)
Potassium: 4.8 mEq/L (ref 3.5–5.1)
Sodium: 132 mEq/L — ABNORMAL LOW (ref 135–145)
Total Bilirubin: 0.5 mg/dL (ref 0.2–1.2)
Total Protein: 6.6 g/dL (ref 6.0–8.3)

## 2022-03-10 LAB — CBC WITH DIFFERENTIAL/PLATELET
Basophils Absolute: 0.1 10*3/uL (ref 0.0–0.1)
Basophils Relative: 1 % (ref 0.0–3.0)
Eosinophils Absolute: 0.1 10*3/uL (ref 0.0–0.7)
Eosinophils Relative: 2.4 % (ref 0.0–5.0)
HCT: 35 % — ABNORMAL LOW (ref 36.0–46.0)
Hemoglobin: 12.2 g/dL (ref 12.0–15.0)
Lymphocytes Relative: 31 % (ref 12.0–46.0)
Lymphs Abs: 1.6 10*3/uL (ref 0.7–4.0)
MCHC: 35 g/dL (ref 30.0–36.0)
MCV: 90.9 fl (ref 78.0–100.0)
Monocytes Absolute: 0.5 10*3/uL (ref 0.1–1.0)
Monocytes Relative: 10.9 % (ref 3.0–12.0)
Neutro Abs: 2.7 10*3/uL (ref 1.4–7.7)
Neutrophils Relative %: 54.7 % (ref 43.0–77.0)
Platelets: 270 10*3/uL (ref 150.0–400.0)
RBC: 3.85 Mil/uL — ABNORMAL LOW (ref 3.87–5.11)
RDW: 13.2 % (ref 11.5–15.5)
WBC: 5 10*3/uL (ref 4.0–10.5)

## 2022-03-10 LAB — LIPID PANEL
Cholesterol: 191 mg/dL (ref 0–200)
HDL: 85 mg/dL (ref >39.00)
LDL Cholesterol: 90 mg/dL (ref 0–99)
NonHDL: 106.19
Total CHOL/HDL Ratio: 2
Triglycerides: 80 mg/dL (ref 0.0–149.0)
VLDL: 16 mg/dL (ref 0.0–40.0)

## 2022-03-10 MED ORDER — ONDANSETRON 4 MG PO TBDP: 4.0000 mg | ORAL_TABLET | Freq: Three times a day (TID) | ORAL | 0 refills | Status: DC | PRN

## 2022-03-10 MED ORDER — SCOPOLAMINE 1 MG/3DAYS TD PT72: 1.0000 | MEDICATED_PATCH | TRANSDERMAL | 0 refills | Status: DC

## 2022-03-10 NOTE — Progress Notes (Signed)
? ?Subjective:  ? ? Patient ID: Cynthia Thornton, female    DOB: 05-Jun-1946, 76 y.o.   MRN: [id number] ? ?DOS:  03/10/2022 ?Type of visit - description: CPX ?Here for a CPX ?In general feels well. ?Still having vertigo, on and off, described as a feeling of spinning when she rolls over. ?History of bronchiectasis, asymptomatic, denies cough, hemoptysis, sputum production. ? ? ?Review of Systems ? ?Other than above, a 14 point review of systems is negative  ? ?  ? ? ?Past Medical History:  ?Diagnosis Date  ? Arthritis   ? Breast cancer, left [facility]) oncologist-- dr [name]  ? dx 09-26-2017--- IDC, Stage IA, Grade 2, ER/PR +;  HER2 negative;  s/p  breast lumpectomy w/ node dissection 11-02-2017;  completed radiation 01-09-2018 (left breast genetic screening panel 2017 with variant of unknown significance AXINA)  ? COPD (chronic obstructive pulmonary disease) ([place])   ? pulmologist-- dr Dillard Essex-- w/ chronic lingular scarring  (last exceratbation bronchiectasis 07/ 2020)  ? Dyslipidemia   ? followed by cardiology/ vascular-- dr g. adams ([facility] in [place])  family history strokes  ? Eczema   ? Family history of breast cancer   ? Family history of colon cancer   ? GERD (gastroesophageal reflux disease)   ? Hiatal hernia   ? History of esophageal stricture   ? s/p dilatation 2008  ? History of external beam radiation therapy   ? left breast 12-19-2017  to 01-09-2018  ? History of hyperthyroidism   ? endocrinologist-- (lov 04-11-2019 epic) dr Loanne Drilling, dx 2009 due to multinodular goiter ,  had taken medication for few months 2010 stopped due to norma TFT;    ? Insomnia   ? Multinodular goiter last thyroid ultrasound in epic 12-18-2018  ? endocrinologist-- dr Loanne Drilling,  dx 2009 w/ hyperthroidism with medication until normal TFT in 2010;    ? Osteopenia 02/2018  ? T score -2.4 distal third of radius.  FRAX 11% / 1.8% stable at other points of interest to include spine, right and left hip  ? Personal history of  colonic polyps 09/06/2001  ? hyperplastic  ? Pityriasis rosea   ? PONV (postoperative nausea and vomiting)   ? severe  ? Primary osteoarthritis of left knee   ? Severe Patellofemoral arthritis  ? Rhomboid pain   ? Right and right trapezius with concomitant cervical spondylosis  ? Wears contact lenses   ? ? ?Past Surgical History:  ?Procedure Laterality Date  ? BREAST LUMPECTOMY WITH RADIOACTIVE SEED AND SENTINEL LYMPH NODE BIOPSY Left 11/02/2017  ? Procedure: LEFT BREAST LUMPECTOMY WITH RADIOACTIVE SEED AND LEFT AXILLARY DEEP SENTINEL LYMPH NODE BIOPSY, INJECT BLUE DYE LEFT BREAST;  Surgeon: [name], MD;  Location: [place];  Service: General;  Laterality: Left;  ? BREAST SURGERY  2000  ? Breast cyst removed, left  ? LAPAROSCOPIC BILATERAL SALPINGO OOPHERECTOMY Bilateral 10/13/2019  ? Procedure: LAPAROSCOPIC BILATERAL SALPINGO OOPHORECTOMY LYSIS OF ADHESIONS PERITONEAL WASHINGS ;  Surgeon: [name], MD;  Location: [place];  Service: Gynecology;  Laterality: Bilateral;  request 7:30am OR time in [place] block ?requests one hour  ? TONSILLECTOMY  age 72  ? TUBAL LIGATION Bilateral yrs ago  ? VAGINAL HYSTERECTOMY  1991  ? Right ovarian cystectomy  ? VIDEO BRONCHOSCOPY Bilateral 03/16/2015  ? Procedure: VIDEO BRONCHOSCOPY WITHOUT FLUORO;  Surgeon: Brand Males, MD;  Location: [facility]COPY;  Service: Endoscopy;  Laterality: Bilateral;  ? ?Social History  ? ?  Socioeconomic History  ? Marital status: Single  ?  Spouse name: Not on file  ? Number of children: 2  ? Years of education: Not on file  ? Highest education level: Not on file  ?Occupational History  ? Occupation: Pharmacist, hospital, works part time   ?  Employer: [employer]  ?Tobacco Use  ? Smoking status: Former  ?  Packs/day: 0.10  ?  Years: 30.00  ?  Pack years: 3.00  ?  Types: Cigarettes  ?  Quit date: 10/31/1995  ?  Years since quitting: 26.3  ? Smokeless tobacco: Never  ?Vaping Use  ? Vaping Use: Never  used  ?Substance and Sexual Activity  ? Alcohol use: Yes  ?  Alcohol/week: 0.0 standard drinks  ?  Comment: seldom  ? Drug use: No  ? Sexual activity: Not Currently  ?  Birth control/protection: Surgical  ?  Comment: 1st intercourse 76 yo-Fewer than 5 partners  ?Other Topics Concern  ? Not on file  ?Social History Narrative  ? Lives by herself  ? 2 children, one has mental issues   ? ?Social Determinants of Health  ? ?Financial Resource Strain: Not on file  ?Food Insecurity: Not on file  ?Transportation Needs: Not on file  ?Physical Activity: Not on file  ?Stress: Not on file  ?Social Connections: Not on file  ?Intimate Partner Violence: Not on file  ? ? ? ?Current Outpatient Medications  ?Medication Instructions  ? B Complex-C (B-COMPLEX WITH VITAMIN C) tablet 1 tablet, Oral, Daily  ? cetirizine (ZYRTEC) 10 mg, Oral, Daily  ? clorazepate (TRANXENE) 7.5 mg, Oral, At bedtime PRN  ? CRANBERRY PO 2 tablets, Oral, 2 times daily  ? famotidine (PEPCID) 20 mg, Oral, Daily  ? letrozole (FEMARA) 2.5 MG tablet TAKE 1 TABLET BY MOUTH EVERY DAY  ? methenamine (HIPREX) 1 g, Oral, 2 times daily with meals, Take with 540m of vitamin c w/ each tablet   ? omeprazole (PRILOSEC) 40 mg, Oral, Daily  ? ondansetron (ZOFRAN-ODT) 4 mg, Oral, Every 8 hours PRN  ? Probiotic Product (PROBIOTIC PO) Oral  ? scopolamine (TRANSDERM-SCOP) 1.5 mg, Transdermal, every 72 hours  ? Vitamin D 2,000 Units, Oral, Daily  ? ? ?   ?Objective:  ? Physical Exam ?BP 122/68 (BP Location: Right Arm, Patient Position: Sitting, Cuff Size: Small)   Pulse 68   Temp 98.1 ?F (36.7 ?C) (Oral)   Resp 16   Ht 5' 3"  (1.6 m)   Wt 142 lb 2 oz (64.5 kg)   SpO2 96%   BMI 25.18 kg/m?  ?General: ?Well developed, NAD, BMI noted ?HEENT:  ?Normocephalic . Face symmetric, atraumatic ?Lungs:  ?CTA B ?Normal respiratory effort, no intercostal retractions, no accessory muscle use. ?Heart: RRR,  no murmur.  ?Abdomen:  ?Not distended, soft, non-tender. No rebound or rigidity.    ?Lower extremities: no pretibial edema bilaterally  ?Skin: Exposed areas without rash. Not pale. Not jaundice ?Neurologic:  ?alert & oriented X3.  ?Speech normal, gait appropriate for age and unassisted ?Strength symmetric and appropriate for age.  ?Psych: ?Cognition and judgment appear intact.  ?Cooperative with normal attention span and concentration.  ?Behavior appropriate. ?No anxious or depressed appearing. ? ?   ?Assessment   ? ?Assessment  ?Bronchiectasis Dr RChase Caller?Anxiety - Insomnia : tranxene   At hs, rx by gyn, previously, I'll Rx if needed  ?Endocrinology Dr. ELoanne Drillingq 3 years, Last visit 03/2015 ?--H/o thyrotoxicosis on remission ?--Goiter   ?Osteopenia --T score -  1.5  (2015), dexa 02-2016 , dexa 03/19/2018 (at gyn) ?DJD -- [facility] ?GI: GERD, HH and esophageal stricture EGD 2007, colon polyps ?Breast cancer, DX 08-2017, s/p lumpectomy,  XRT, B salpingo-oophorectomy ?Recurrent UTIs, onset ~ 2018 ?  ?PLAN ?Here for CPX ?Bronchiectasis: Asymptomatic ?Anxiety, insomnia: Takes Tranxene very rarely ?Goiter: ?Saw endocrinology last month, TSH was 3.5, ultrasound thyroid was done. Next steps by Endo ?GERD: Saw GI last year, felt to be stable on omeprazole and famotidine. ?Vertigo: On and off issue, saw ENT, scheduled for Dix-Hallpike test. ?Taking a cruise, request Zofran, prescription issued.  We also talk about the scopolamine, she is willing to try, watch for side effects, Rx provided.  Although there is no interaction, recommend to avoid if possible take both. ?History of breast cancer: Sees oncology, takes Femara ?RTC 1 year ? ? ? ?

## 2022-03-10 NOTE — Patient Instructions (Addendum)
Recommend to proceed with covid booster (bivalent) at your pharmacy.  ? ?  ? ? ?GO TO THE LAB : Get the blood work   ? ? ?[place], [place] ?Come back for a physical in 1 year ? ? ?"Living will", "Health Care Power of attorney": Advanced care planning ? ?(If you already have a living will or healthcare power of attorney, please bring the copy to be scanned in your chart.) ? ?Advance care planning is a process that supports adults in  understanding and sharing their preferences regarding future medical care.  ? ?The patient's preferences are recorded in documents called Advance Directives.    ?Advanced directives are completed (and can be modified at any time) while the patient is in full mental capacity.  ? ?The documentation should be available at all times to the patient, the family and the healthcare providers.  ?Bring in a copy to be scanned in your chart is an excellent idea and is recommended  ? ?This legal documents direct treatment decision making and/or appoint a surrogate to make the decision if the patient is not capable to do so.  ? ? ?Advance directives can be documented in many types of formats,  documents have names such as:  ?Lliving will  ?Durable power of attorney for healthcare (healthcare proxy or healthcare power of attorney)  ?Combined directives  ?Physician orders for life-sustaining treatment  ?  ?More information at: ? [email or web address]  ?

## 2022-03-13 ENCOUNTER — Encounter: Payer: Self-pay | Admitting: Internal Medicine

## 2022-03-13 NOTE — Assessment & Plan Note (Signed)
Here for CPX ?Bronchiectasis: Asymptomatic ?Anxiety, insomnia: Takes Tranxene very rarely ?Goiter: ?Saw endocrinology last month, TSH was 3.5, ultrasound thyroid was done. Next steps by Endo ?GERD: Saw GI last year, felt to be stable on omeprazole and famotidine. ?Vertigo: On and off issue, saw ENT, scheduled for Dix-Hallpike test. ?Taking a cruise, request Zofran, prescription issued.  We also talk about the scopolamine, she is willing to try, watch for side effects, Rx provided.  Although there is no interaction, recommend to avoid if possible take both. ?History of breast cancer: Sees oncology, takes Femara ?RTC 1 year ?

## 2022-03-13 NOTE — Assessment & Plan Note (Signed)
-  Td 2020 ?- zostavax 2010.  S/p Shingrix ?- prevanr 2014; pnm shot 2 : 07-2015 ?- PNM 20: Will hold for now, she will get the COVID-vaccine first ?-COVID VAX: Booster is a consideration.  Patient likely to proceed ?-CCS: Last colonoscopy 08-2015, + polyps, cscope 04/2019, next 5 years per GI.  ?Female care:  ?--Sees gyn, s/p BSO  09/2019 (d/t h/o breast ca) ?---breast ca dx 08-2017, MMG 08-2021 ([facility]) ?-Diet has a healthy diet, encouraged to stay active but at the same time be very mindful of falls.  See AVS. ?-Labs CMP, FLP, CBC ?- DEXA per gyn, takes Vit D ?-ACP discussed ? ?  ?

## 2022-03-14 NOTE — Addendum Note (Signed)
Addended byDamita Dunnings D on: 03/14/2022 04:29 PM ? ? Modules accepted: Orders ? ?

## 2022-03-15 ENCOUNTER — Encounter: Payer: Self-pay | Admitting: Internal Medicine

## 2022-03-16 DIAGNOSIS — H8111 Benign paroxysmal vertigo, right ear: Secondary | ICD-10-CM | POA: Diagnosis not present

## 2022-03-22 ENCOUNTER — Telehealth: Payer: Self-pay | Admitting: Internal Medicine

## 2022-03-22 NOTE — Telephone Encounter (Signed)
Results faxed.

## 2022-03-22 NOTE — Telephone Encounter (Signed)
Patient would like her 05/12 lab work to be sent to Dr. Brunetta Jeans [facility] in [place]  Fax: [phone]. Please advise.

## 2022-04-10 ENCOUNTER — Telehealth: Payer: Self-pay | Admitting: Internal Medicine

## 2022-04-10 NOTE — Telephone Encounter (Signed)
Called and spoke with patient. She stated that she was in close contact with her sister and brother in law over the past weekend. Her brother in law tested positive for COVID yesterday. She felt fine until this morning when she woke up. She has been having headaches on and off all day as well as a sore throat. Denied any increase in SOB. Increase in fatigue. She took a COVID test at home this morning and it was negative.   She wanted to know if Dr. Chase Caller had any recommendations despite her being negative.   Pharmacy is [facility] on [address].   MR, can you please advise? Thanks!

## 2022-04-10 NOTE — Telephone Encounter (Signed)
She can test covid antigen test again later today and again 04/11/22 - if 3 are negative in < 24h odds of having significant covid is low but not ruled out. If she wants to . > 99% sure - then she should go get a covid PCR test. If positive, then we can prescribe paxlovid  For headache/sore throat - jyst symptom management with standard OTC meds

## 2022-04-10 NOTE — Telephone Encounter (Signed)
Called pt and there was no answer-LMTCB °

## 2022-04-11 NOTE — Telephone Encounter (Signed)
Called patient but she did not answer. Left message for her to call back.  

## 2022-04-11 NOTE — Telephone Encounter (Signed)
Returned patients call and went over recommendations from Dr. Chase Caller. She states that she is still feeling unwell.  She states she has done two at home covid tests and will do another tomorrow. So far the at home tests have been negative.  She states she feels very tired and fatigued and states she was offered an appt to come to the office today but she refused because she feels like she can't get out of bed and she has had a fever of 100. Patient states that she has been taking robitussin q4h and ibuprofen prn. She is very concerned. Advised patient again on what Dr. Chase Caller recommended and she states she will feel better if we send him another message on what is going on.  Dr. Chase Caller please advise.

## 2022-04-11 NOTE — Telephone Encounter (Signed)
Patient is returning phone call. Patient phone number is [phone].

## 2022-04-12 MED ORDER — PAXLOVID (300/100) 20 X 150 MG & 10 X 100MG PO TBPK: 3.0000 | ORAL_TABLET | Freq: Two times a day (BID) | ORAL | 0 refills | Status: DC

## 2022-04-12 NOTE — Telephone Encounter (Signed)
Patient returned call. She verbalized understanding. RX has been sent in for her.   Nothing further needed at time of call.

## 2022-04-12 NOTE — Telephone Encounter (Signed)
Patient states tested positive for Covid 19 this morning. Would like recommendations. Pharmacy is [facility]. Patient phone number is [phone].

## 2022-04-12 NOTE — Telephone Encounter (Signed)
Called patient but she did not answer. Left message for her to call back.   Dr. Chase Caller, she tested positive this morning. Can you please advise? Thanks!

## 2022-04-12 NOTE — Telephone Encounter (Signed)
Triage  Please give normal dose paxlovid - call this in . However, before you do please check her medi list against the warning meds below   PAXLOVID   Paxlovid (nirmatelvir 300/Ritonavir100) - BID x 5 days - for GFR >= 60   PLEASE CHECK MED LIST for the following issues. Please check the 2 different condition related to concomitant medications in alphabetical order  If Cynthia Thornton with DOB 1945-11-03 is on any of the following Strong CYP3A inhibtors - this patient Cynthia Thornton should withold these concomitant meds so they can start paxlovid stratight away. If taking any of these:  alfuzosin, amiodarone, clozapine, colchicine, dihydroergotamine, dronedarone, ergotamine, flecainide, lovastatin, lurasidone, methylergonovine, midazolam [oral], pethidine, pimozide, propafenone, propoxyphene, quinidine, ranolazine, sildenafil simvastatin, triazolam).   If   Cynthia Thornton  with dob May 13, 1946 Is on any of these other strong CYP3A inducers then starting paxlovid should be delayed and the following meds should wash out first. These are dapalutamide, carbamazepine, phenobarbital, phenytoin, rifampin, St John's wort) - let me know immediately and we should delay starting paxlovid by some days even if he stops these medication.    PLEASE INFORM Cynthia Thornton  OF FOLLOWING SIDE EFFECTS  Side effects - all < 5%  - skin rash (and veyr rare a conditon called TEN) - angiomedia  - myalgia - jaundice - high bP (1%) - loss of taste  - diarrhea - rebound 25%      Current Outpatient Medications:    B Complex-C (B-COMPLEX WITH VITAMIN C) tablet, Take 1 tablet by mouth daily., Disp: , Rfl:    cetirizine (ZYRTEC) 10 MG tablet, Take 10 mg by mouth daily., Disp: , Rfl:    Cholecalciferol (VITAMIN D) 2000 units CAPS, Take 2,000 Units by mouth daily., Disp: , Rfl:    clorazepate (TRANXENE) 7.5 MG tablet, Take 1 tablet (7.5 mg total) by mouth at bedtime as needed for anxiety. (Patient not taking: Reported  on 03/10/2022), Disp: 30 tablet, Rfl: 2   CRANBERRY PO, Take 2 tablets by mouth in the morning and at bedtime., Disp: , Rfl:    famotidine (PEPCID) 20 MG tablet, Take 1 tablet (20 mg total) by mouth daily., Disp: 90 tablet, Rfl: 3   letrozole (FEMARA) 2.5 MG tablet, TAKE 1 TABLET BY MOUTH EVERY DAY, Disp: 90 tablet, Rfl: 1   methenamine (HIPREX) 1 g tablet, Take 1 g by mouth 2 (two) times daily with a meal. Take with '500mg'$  of vitamin c w/ each tablet, Disp: , Rfl:    omeprazole (PRILOSEC) 40 MG capsule, Take 1 capsule (40 mg total) by mouth daily., Disp: 90 capsule, Rfl: 3   ondansetron (ZOFRAN-ODT) 4 MG disintegrating tablet, Take 1 tablet (4 mg total) by mouth every 8 (eight) hours as needed for nausea or vomiting., Disp: 30 tablet, Rfl: 0   Probiotic Product (PROBIOTIC PO), Take by mouth., Disp: , Rfl:    scopolamine (TRANSDERM-SCOP) 1 MG/3DAYS, Place 1 patch (1.5 mg total) onto the skin every 3 (three) days., Disp: 10 patch, Rfl: 0  Latest Reference Range & Units 04/05/07 14:27 11/03/08 10:11 09/13/12 14:53 09/09/14 14:54 12/10/14 15:01 03/04/16 02:51 08/09/16 15:49 12/08/16 15:37 10/03/17 08:37 12/16/18 10:38 12/20/18 14:05 12/26/19 10:35 04/23/20 16:15 04/23/20 23:00 04/24/20 05:00 04/25/20 04:34 04/26/20 04:39 04/27/20 03:51 04/28/20 03:44 05/18/20 11:47 05/19/20 15:11 08/13/20 09:57 08/13/20 10:03 01/07/21 11:41 03/10/22 08:31  Creatinine 0.40 - 1.20 mg/dL 1.0 0.9 1.2 1.0 0.89 1.19 (H) 0.91 0.88 1.0 0.96 0.93 0.90 0.94 1.02 (  H) 0.70 0.96 0.80 1.04 (H) 0.92 0.78 1.03 0.93 17.1 0.95 1.00  (H): Data is abnormally high

## 2022-04-13 ENCOUNTER — Telehealth: Payer: Self-pay | Admitting: Internal Medicine

## 2022-04-14 ENCOUNTER — Ambulatory Visit: Payer: Medicare PPO | Admitting: Obstetrics & Gynecology

## 2022-04-14 NOTE — Telephone Encounter (Signed)
Called and spoke with patient. She stated that she is feeling much better. She has not had any covid symptoms in 2 days. She wanted to know when she return back to work and be around other people. She had read information online but it was confusing.   MR, can you please advise? Thanks!

## 2022-04-18 NOTE — Telephone Encounter (Signed)
Sorry for delay here. Yes, it is confusing. At 11 days from first day of symptom - she can go without mask. CDC is recommending 5 days but this is a bit aggressive and she can still be contagious. So, my recommendation is if is between 5-11 days from first day of symptom -> is to do an antigen test -> if negative she is NOT contagious and can go to work but mask around people so she is extra careful

## 2022-04-19 NOTE — Telephone Encounter (Signed)
Attempted to call pt but unable to reach. Left message for her to return call. 

## 2022-04-27 ENCOUNTER — Telehealth: Payer: Self-pay | Admitting: Internal Medicine

## 2022-04-27 NOTE — Telephone Encounter (Signed)
Left message for patient to call back and schedule Medicare Annual Wellness Visit (AWV).   Please offer to do virtually or by telephone.  Left office number and my jabber [phone].  Last AWV:01/06/2021  Please schedule at anytime with Nurse Health Advisor.

## 2022-04-28 ENCOUNTER — Telehealth: Payer: Self-pay | Admitting: Hematology and Oncology

## 2022-04-28 ENCOUNTER — Ambulatory Visit: Payer: Medicare PPO | Admitting: Obstetrics & Gynecology

## 2022-04-28 NOTE — Telephone Encounter (Signed)
.  Called patient to schedule appointment per 6/30 inbasket, patient is aware of date and time.

## 2022-05-05 ENCOUNTER — Ambulatory Visit (INDEPENDENT_AMBULATORY_CARE_PROVIDER_SITE_OTHER): Payer: Medicare PPO | Admitting: Obstetrics & Gynecology

## 2022-05-05 ENCOUNTER — Encounter: Payer: Self-pay | Admitting: Obstetrics & Gynecology

## 2022-05-05 VITALS — HR 78 | Ht 62.75 in | Wt 143.0 lb

## 2022-05-05 DIAGNOSIS — Z9289 Personal history of other medical treatment: Secondary | ICD-10-CM | POA: Diagnosis not present

## 2022-05-05 DIAGNOSIS — Z9189 Other specified personal risk factors, not elsewhere classified: Secondary | ICD-10-CM

## 2022-05-05 DIAGNOSIS — Z90722 Acquired absence of ovaries, bilateral: Secondary | ICD-10-CM

## 2022-05-05 DIAGNOSIS — Z780 Asymptomatic menopausal state: Secondary | ICD-10-CM

## 2022-05-05 DIAGNOSIS — Z90710 Acquired absence of both cervix and uterus: Secondary | ICD-10-CM

## 2022-05-05 DIAGNOSIS — Z853 Personal history of malignant neoplasm of breast: Secondary | ICD-10-CM

## 2022-05-05 DIAGNOSIS — M810 Age-related osteoporosis without current pathological fracture: Secondary | ICD-10-CM

## 2022-05-05 DIAGNOSIS — Z9079 Acquired absence of other genital organ(s): Secondary | ICD-10-CM

## 2022-05-05 DIAGNOSIS — Z01419 Encounter for gynecological examination (general) (routine) without abnormal findings: Secondary | ICD-10-CM

## 2022-05-05 NOTE — Progress Notes (Signed)
Cynthia Thornton 08-14-1946 [id number]   History:    76 y.o. G3P2A1L2     RP:  Established patient for Annual Gynecologic exam   HPI:  S/P Total Hysterectomy and then 09/2019 BSO.  Abstinent.  Pap Neg 08/2011.  No indication for a Pap test at this time.  H/O left Breast Ca, carrier of a  variant of unknown significant AXIN2. Breasts normal.  Mammo Neg 09/21/21.  Urine normal.  BMI 25.53.  Health labs with Dr Larose Kells.  [place] 04/2019.  BD 04/2020 Osteoporosis T-Score -3.0 at Lt total forearm.  Vit D, Ca++.   Past medical history,surgical history, family history and social history were all reviewed and documented in the EPIC chart.  Gynecologic History No LMP recorded. Patient has had a hysterectomy.  Obstetric History OB History  Gravida Para Term Preterm AB Living  '3 2 2   1 2  '$ SAB IAB Ectopic Multiple Live Births  1            # Outcome Date GA Lbr Len/2nd Weight Sex Delivery Anes PTL Lv  3 SAB           2 Term           1 Term              ROS: A ROS was performed and pertinent positives and negatives are included in the history. GENERAL: No fevers or chills. HEENT: No change in vision, no earache, sore throat or sinus congestion. NECK: No pain or stiffness. CARDIOVASCULAR: No chest pain or pressure. No palpitations. PULMONARY: No shortness of breath, cough or wheeze. GASTROINTESTINAL: No abdominal pain, nausea, vomiting or diarrhea, melena or bright red blood per rectum. GENITOURINARY: No urinary frequency, urgency, hesitancy or dysuria. MUSCULOSKELETAL: No joint or muscle pain, no back pain, no recent trauma. DERMATOLOGIC: No rash, no itching, no lesions. ENDOCRINE: No polyuria, polydipsia, no heat or cold intolerance. No recent change in weight. HEMATOLOGICAL: No anemia or easy bruising or bleeding. NEUROLOGIC: No headache, seizures, numbness, tingling or weakness. PSYCHIATRIC: No depression, no loss of interest in normal activity or change in sleep pattern.     Exam:   Pulse 78    Ht 5' 2.75" (1.594 m)   Wt 143 lb (64.9 kg)   SpO2 99%   BMI 25.53 kg/m   Body mass index is 25.53 kg/m.  General appearance : Well developed well nourished female. No acute distress HEENT: Eyes: no retinal hemorrhage or exudates,  Neck supple, trachea midline, no carotid bruits, no thyroidmegaly Lungs: Clear to auscultation, no rhonchi or wheezes, or rib retractions  Heart: Regular rate and rhythm, no murmurs or gallops Breast:Examined in sitting and supine position were symmetrical in appearance, no palpable masses or tenderness,  no skin retraction, no nipple inversion, no nipple discharge, no skin discoloration, no axillary or supraclavicular lymphadenopathy Abdomen: no palpable masses or tenderness, no rebound or guarding Extremities: no edema or skin discoloration or tenderness  Pelvic: Vulva: Normal             Vagina: No gross lesions or discharge  Cervix/Uterus absent  Adnexa  Without masses or tenderness  Anus: Normal   Assessment/Plan:  76 y.o. female for annual exam   1. Well female exam with routine gynecological exam S/P Total Hysterectomy and then 09/2019 BSO.  Abstinent.  Pap Neg 08/2011.  No indication for a Pap test at this time.  H/O left Breast Ca, carrier of a  variant of unknown significant  AXIN2. Breasts normal.  Mammo Neg 09/21/21.  Urine normal.  BMI 25.53.  Health labs with Dr Larose Kells.  [place] 04/2019.  BD 04/2020 Osteoporosis T-Score -3.0 at Lt total forearm.  Vit D, Ca++.  2. Status post total hysterectomy and bilateral salpingo-oophorectomy (BSO)  3. Postmenopause S/P Total Hysterectomy and then 09/2019 BSO. Postmenopause, well on no HRT.  Abstinent.  4. Age-related osteoporosis without current pathological fracture BD 04/2020 Osteoporosis T-Score -3.0 at Lt total forearm.  Vit D, Ca++.  Weight bearing physical activities to continue.  Schedule BD here now.  - DG Bone Density; Future  5. History of breast cancer  6. Personal history of other medical  treatment  Other orders - diazepam (VALIUM) 2 MG tablet; Take 2 mg by mouth every 6 (six) hours as needed. - methocarbamol (ROBAXIN) 500 MG tablet; Take 500 mg by mouth every 8 (eight) hours as needed.   [name] MD, 2:15 PM 05/05/2022

## 2022-05-10 ENCOUNTER — Ambulatory Visit: Payer: Medicare PPO | Admitting: Endocrinology

## 2022-05-11 ENCOUNTER — Ambulatory Visit: Payer: Medicare PPO | Admitting: Hematology and Oncology

## 2022-05-31 NOTE — Progress Notes (Signed)
 Patient Care Team: [name], Jose E, MD as PCP - General Aluisio, Frank, MD as Consulting Physician (Orthopedic Surgery) Gudena, Vinay, MD as Consulting Physician (Hematology and Oncology) Ingram, Haywood, MD as Consulting Physician (General Surgery) [name], Murali, MD as Consulting Physician (Pulmonary Disease) Moody, John, MD as Consulting Physician (Radiation Oncology) Causey, Lindsey Cornetto, NP as Nurse Practitioner (Hematology and Oncology) Herrick, Benjamin W, MD as Attending Physician (Urology) Bates, Dwight, MD as Consulting Physician (Otolaryngology) Adams, George L. (Inactive) (Cardiology) Lavoie, Marie-Lyne, MD as Consulting Physician (Obstetrics and Gynecology)  DIAGNOSIS:  Encounter Diagnosis  Name Primary?   Malignant neoplasm of lower-inner quadrant of left breast in female, estrogen receptor positive (HCC)     SUMMARY OF ONCOLOGIC HISTORY: Oncology History  Malignant neoplasm of lower-inner quadrant of left breast in female, estrogen receptor positive (HCC)  09/26/2017 Initial Diagnosis   Screening detected left breast calcifications 1.1 cm span axilla negative, biopsy revealed grade 2 invasive ductal carcinoma with DCIS ER 100% PR 2%, HER-2 negative ratio 0.78, Ki-67 20%, T1CN0 stage Ia clinical stage AJCC 8   11/02/2017 Surgery   Left Lumpectomy: IDC grade 2, 1.2 cm, 0/3 LN Neg, , ER 100% PR 2%, HER-2 negative ratio 0.78, Ki-67 20% T1c N0 Stage 1A   12/19/2017 - 01/09/2018 Radiation Therapy   Adjuvant radiation therapy   01/2018 - 11/2021 Anti-estrogen oral therapy   Letrozole daily stopped because of depression and hot flashes and myalgias, Discontinued Oct 2019 and resumed Oct 2021 taking it 1/2 tab daily     CHIEF COMPLIANT: Follow-up of left breast cancer on surveillance   INTERVAL HISTORY: Cynthia Thornton is a 76 y.o. with above-mentioned history of left breast cancer treated with lumpectomy, radiation, and who could not tolerate antiestrogen therapy with  letrozole. She is currently on surveillance. She presents to the clinic today for a follow-up.  She could not tolerate antiestrogen therapy and therefore she discontinued it in February 2023.  Since then she has been feeling remarkably better.  She has no issues with her emotional state as well as no complaints of bone and joint aches and pains.   ALLERGIES:  is allergic to doxycycline, other, oxycodone, sulfa antibiotics, trimethoprim sulfate [trimethoprim], codeine, and propranolol hcl.  MEDICATIONS:  Current Outpatient Medications  Medication Sig Dispense Refill   B Complex-C (B-COMPLEX WITH VITAMIN C) tablet Take 1 tablet by mouth daily.     cetirizine (ZYRTEC) 10 MG tablet Take 10 mg by mouth daily.     Cholecalciferol (VITAMIN D) 2000 units CAPS Take 2,000 Units by mouth daily.     clorazepate (TRANXENE) 7.5 MG tablet Take 1 tablet (7.5 mg total) by mouth at bedtime as needed for anxiety. 30 tablet 2   CRANBERRY PO Take 2 tablets by mouth in the morning and at bedtime.     diazepam (VALIUM) 2 MG tablet Take 2 mg by mouth every 6 (six) hours as needed.     famotidine (PEPCID) 20 MG tablet Take 1 tablet (20 mg total) by mouth daily. 90 tablet 3   letrozole (FEMARA) 2.5 MG tablet TAKE 1 TABLET BY MOUTH EVERY DAY 90 tablet 1   methenamine (HIPREX) 1 g tablet Take 1 g by mouth 2 (two) times daily with a meal. Take with 500mg of vitamin c w/ each tablet     methocarbamol (ROBAXIN) 500 MG tablet Take 500 mg by mouth every 8 (eight) hours as needed.     omeprazole (PRILOSEC) 40 MG capsule Take 1 capsule (40 mg   total) by mouth daily. 90 capsule 3   ondansetron (ZOFRAN-ODT) 4 MG disintegrating tablet Take 1 tablet (4 mg total) by mouth every 8 (eight) hours as needed for nausea or vomiting. 30 tablet 0   Probiotic Product (PROBIOTIC PO) Take by mouth.     No current facility-administered medications for this visit.    PHYSICAL EXAMINATION: ECOG PERFORMANCE STATUS: 1 - Symptomatic but  completely ambulatory  Vitals:   06/08/22 1452  BP: 118/65  Pulse: 71  Resp: 18  Temp: 97.9 F (36.6 C)  SpO2: 98%   Filed Weights   06/08/22 1452  Weight: 145 lb (65.8 kg)    BREAST: No palpable masses or nodules in either right or left breasts. No palpable axillary supraclavicular or infraclavicular adenopathy no breast tenderness or nipple discharge. (exam performed in the presence of a chaperone)  LABORATORY DATA:  I have reviewed the data as listed    Latest Ref Rng & Units 03/10/2022    8:31 AM 01/07/2021   11:41 AM 08/13/2020    9:57 AM  CMP  Glucose 70 - 99 mg/dL 89  85  84   BUN 6 - 23 mg/dL 14  21  19   Creatinine 0.40 - 1.20 mg/dL 1.00  0.95  0.93   Sodium 135 - 145 mEq/L 132  134  134   Potassium 3.5 - 5.1 mEq/L 4.8  4.9  5.2   Chloride 96 - 112 mEq/L 96  99  100   CO2 19 - 32 mEq/L 28  27  26   Calcium 8.4 - 10.5 mg/dL 9.6  9.2  9.8   Total Protein 6.0 - 8.3 g/dL 6.6  6.4    Total Bilirubin 0.2 - 1.2 mg/dL 0.5  0.4    Alkaline Phos 39 - 117 U/L 86  83    AST 0 - 37 U/L 19  16    ALT 0 - 35 U/L 14  14      Lab Results  Component Value Date   WBC 5.0 03/10/2022   HGB 12.2 03/10/2022   HCT 35.0 (L) 03/10/2022   MCV 90.9 03/10/2022   PLT 270.0 03/10/2022   NEUTROABS 2.7 03/10/2022    ASSESSMENT & PLAN:  Malignant neoplasm of lower-inner quadrant of left breast in female, estrogen receptor positive (HCC) 10/31/17: Left Lumpectomy: IDC grade 2, 1.2 cm, 0/3 LN Neg, , ER 100% PR 2%, HER-2 negative ratio 0.78, Ki-67 20% T1c N0 Stage 1A   Treatment summary: 1. Oncotype DX 16: 4% risk of recurrence with 5 years of tamoxifen: Low risk 2. Adjuvant radiation therapy 12/19/2017 to 01/11/2018 3. Adjuvant antiestrogen therapy: With letrozole 2.5 mg daily to started  01/28/2018 stopped within a few months because of hot flashes and depression, restarted 08/06/2020 1 tablet every other day stopped February 2023   She could not tolerate Effexor because of nausea and  vomiting.   Breast cancer surveillance: 1.  Mammograms 09/26/2021: Benign 2.  Breast exam 06/08/2022: Benign Ultrasound thyroid 02/16/2022: Multinodular thyroid again identified.  Continued annual surveillance for 5 years   Bone density 04/20/2020: T score -3: Osteoporosis: She is getting a bone density done next week with  Return to clinic in 1 year for follow-up and after that she could be followed on an as-needed basis.    No orders of the defined types were placed in this encounter.  The patient has a good understanding of the overall plan. she agrees with it. she will call with any   problems that may develop before the next visit here. Total time spent: 30 mins including face to face time and time spent for planning, charting and co-ordination of care   Harriette Ohara, MD 06/08/22    I Gardiner Coins am scribing for Dr. [name]  I have reviewed the above documentation for accuracy and completeness, and I agree with the above.

## 2022-06-08 ENCOUNTER — Other Ambulatory Visit: Payer: Self-pay

## 2022-06-08 ENCOUNTER — Inpatient Hospital Stay: Payer: Medicare PPO | Attending: Hematology and Oncology | Admitting: Hematology and Oncology

## 2022-06-08 DIAGNOSIS — Z881 Allergy status to other antibiotic agents status: Secondary | ICD-10-CM | POA: Diagnosis not present

## 2022-06-08 DIAGNOSIS — Z170 Estrogen receptor positive status [ER+]: Secondary | ICD-10-CM

## 2022-06-08 DIAGNOSIS — Z853 Personal history of malignant neoplasm of breast: Secondary | ICD-10-CM | POA: Insufficient documentation

## 2022-06-08 DIAGNOSIS — Z9221 Personal history of antineoplastic chemotherapy: Secondary | ICD-10-CM | POA: Diagnosis not present

## 2022-06-08 DIAGNOSIS — Z79899 Other long term (current) drug therapy: Secondary | ICD-10-CM | POA: Insufficient documentation

## 2022-06-08 DIAGNOSIS — Z923 Personal history of irradiation: Secondary | ICD-10-CM | POA: Diagnosis not present

## 2022-06-08 DIAGNOSIS — M810 Age-related osteoporosis without current pathological fracture: Secondary | ICD-10-CM | POA: Diagnosis not present

## 2022-06-08 DIAGNOSIS — C50312 Malignant neoplasm of lower-inner quadrant of left female breast: Secondary | ICD-10-CM

## 2022-06-08 DIAGNOSIS — Z882 Allergy status to sulfonamides status: Secondary | ICD-10-CM | POA: Insufficient documentation

## 2022-06-08 DIAGNOSIS — Z885 Allergy status to narcotic agent status: Secondary | ICD-10-CM | POA: Insufficient documentation

## 2022-06-08 NOTE — Assessment & Plan Note (Addendum)
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 Oncotype DX16: 4% risk of recurrence with 5 years of tamoxifen: Low risk 2. Adjuvant radiation therapy2/20/2019 to 01/11/2018 3. Adjuvant antiestrogen therapy: With letrozole 2.5 mg daily to started4/10/2017 stopped within a few months because of hot flashes and depression, restarted 10/8/20211 tablet every other day stopped February 2023  She could not tolerate Effexor because of nausea and vomiting.  Breast cancer surveillance: 1.Mammograms 09/26/2021: Benign 2.Breast exam8/07/2022:Benign Ultrasound thyroid 02/16/2022: Multinodular thyroid again identified.  Continued annual surveillance for 5 years  Bone density 04/20/2020: T score -3: Osteoporosis: She is getting a bone density done next week with  Return to clinic in 1 year for follow-up and after that she could be followed on an as-needed basis.

## 2022-06-13 ENCOUNTER — Other Ambulatory Visit: Payer: Self-pay | Admitting: Obstetrics & Gynecology

## 2022-06-13 ENCOUNTER — Ambulatory Visit (INDEPENDENT_AMBULATORY_CARE_PROVIDER_SITE_OTHER): Payer: Medicare PPO

## 2022-06-13 DIAGNOSIS — M810 Age-related osteoporosis without current pathological fracture: Secondary | ICD-10-CM

## 2022-06-13 DIAGNOSIS — Z780 Asymptomatic menopausal state: Secondary | ICD-10-CM

## 2022-06-13 DIAGNOSIS — Z13820 Encounter for screening for osteoporosis: Secondary | ICD-10-CM

## 2022-06-14 ENCOUNTER — Encounter: Payer: Self-pay | Admitting: Obstetrics & Gynecology

## 2022-06-14 ENCOUNTER — Ambulatory Visit: Payer: Medicare PPO | Admitting: Obstetrics & Gynecology

## 2022-06-14 VITALS — BP 124/80

## 2022-06-14 DIAGNOSIS — N766 Ulceration of vulva: Secondary | ICD-10-CM

## 2022-06-14 MED ORDER — VALACYCLOVIR HCL 1 G PO TABS: 1000.0000 mg | ORAL_TABLET | Freq: Every day | ORAL | 0 refills | Status: AC

## 2022-06-14 MED ORDER — CLOBETASOL PROPIONATE 0.05 % EX OINT: 1.0000 | TOPICAL_OINTMENT | Freq: Two times a day (BID) | CUTANEOUS | 1 refills | Status: DC

## 2022-06-14 NOTE — Progress Notes (Signed)
    Cynthia Thornton [birth year] [id number]        76 y.o.  [id number] Widowed  RP: Painful vulvar and perianal sores x 1 week  HPI: Vulvar and perianal painful irritation.  Very painful when passing a BM x 1 week.  Applied Hydrocortisone 1% without relief.  BMs are soft as usual.  Urine normal.  Not sexually active x the 1990's. No h/o genital HSV or cold sore. Not using new products in that area.  No mouth sores.   OB History  Gravida Para Term Preterm AB Living  '3 2 2   1 2  '$ SAB IAB Ectopic Multiple Live Births  1            # Outcome Date GA Lbr Len/2nd Weight Sex Delivery Anes PTL Lv  3 SAB           2 Term           1 Term             Past medical history,surgical history, problem list, medications, allergies, family history and social history were all reviewed and documented in the EPIC chart.   Directed ROS with pertinent positives and negatives documented in the history of present illness/assessment and plan.  Exam:  Vitals:   06/14/22 1012  BP: 124/80   General appearance:  Normal  Gynecologic exam: Physical Exam Genitourinary:       Assessment/Plan:  76 y.o. [id number]   1. Vulvar and perianal ulceration Vulvar and perianal painful irritation.  Very painful when passing a BM x 1 week.  Applied Hydrocortisone 1% without relief.  BMs are soft as usual.  Urine normal.  Not sexually active x the 1990's. No h/o genital HSV or cold sore. Not using new products in that area.  No mouth sores.  Vulvar/perianal ulcers c/w Genital HSV.  Possible Lichen planus or Lichen sclerosus.  Counseling done.  SureSwab HSV pending.  Will treat with Valacyclovir and Clobetasol 0.05% ointment.  Usage reviewed. Prescriptions sent to pharmacy.  F/U in 2 weeks to reassess, possible Bx. - SureSwab HSV, Type 1/2 DNA, PCR  Other orders - valACYclovir (VALTREX) 1000 MG tablet; Take 1 tablet (1,000 mg total) by mouth daily for 5 days. - clobetasol ointment (TEMOVATE) 0.05 %; Apply 1 Application  topically 2 (two) times daily. Thin application on affected skin of the vulva and perianal area twice a day x 2 weeks.   [name] MD, 10:40 AM 06/14/2022

## 2022-06-17 ENCOUNTER — Ambulatory Visit
Admission: EM | Admit: 2022-06-17 | Discharge: 2022-06-17 | Disposition: A | Discharge status: Home / Self Care | Payer: Medicare PPO | Attending: Urgent Care | Admitting: Urgent Care

## 2022-06-17 ENCOUNTER — Encounter: Payer: Self-pay | Admitting: Emergency Medicine

## 2022-06-17 DIAGNOSIS — N3001 Acute cystitis with hematuria: Secondary | ICD-10-CM | POA: Diagnosis not present

## 2022-06-17 LAB — POCT URINALYSIS DIP (MANUAL ENTRY)
Bilirubin, UA: NEGATIVE
Glucose, UA: NEGATIVE mg/dL
Ketones, POC UA: NEGATIVE mg/dL
Nitrite, UA: NEGATIVE
Protein Ur, POC: NEGATIVE mg/dL
Spec Grav, UA: <=1.005 — AB (ref 1.010–1.025)
Urobilinogen, UA: 0.2 E.U./dL
pH, UA: 5.5 (ref 5.0–8.0)

## 2022-06-17 MED ORDER — CEPHALEXIN 500 MG PO CAPS: 500.0000 mg | ORAL_CAPSULE | Freq: Two times a day (BID) | ORAL | 0 refills | Status: DC

## 2022-06-17 NOTE — ED Triage Notes (Signed)
Pt here with burning, urgency and increased urinary frequency x 3 days.

## 2022-06-17 NOTE — Discharge Instructions (Signed)

## 2022-06-17 NOTE — ED Provider Notes (Addendum)
[name]   MRN: [id number] DOB: January 25, 1946  Subjective:   Cynthia Thornton is a 76 y.o. female presenting for 3-day history of acute onset dysuria, urinary urgency, urinary frequency. Hydrates well. No fever, flank pain, n/v, abdominal pain, pelvic pain.  Has a history of UTIs. Drinks Zevia once daily. No coffee. Tries to hydrate well.   No current facility-administered medications for this encounter.  Current Outpatient Medications:    B Complex-C (B-COMPLEX WITH VITAMIN C) tablet, Take 1 tablet by mouth daily., Disp: , Rfl:    cetirizine (ZYRTEC) 10 MG tablet, Take 10 mg by mouth daily., Disp: , Rfl:    Cholecalciferol (VITAMIN D) 2000 units CAPS, Take 2,000 Units by mouth daily., Disp: , Rfl:    clobetasol ointment (TEMOVATE) 3.24 %, Apply 1 Application topically 2 (two) times daily. Thin application on affected skin of the vulva and perianal area twice a day x 2 weeks., Disp: 30 g, Rfl: 1   clorazepate (TRANXENE) 7.5 MG tablet, Take 1 tablet (7.5 mg total) by mouth at bedtime as needed for anxiety., Disp: 30 tablet, Rfl: 2   CRANBERRY PO, Take 2 tablets by mouth in the morning and at bedtime., Disp: , Rfl:    diazepam (VALIUM) 2 MG tablet, Take 2 mg by mouth every 6 (six) hours as needed., Disp: , Rfl:    famotidine (PEPCID) 20 MG tablet, Take 1 tablet (20 mg total) by mouth daily., Disp: 90 tablet, Rfl: 3   letrozole (FEMARA) 2.5 MG tablet, TAKE 1 TABLET BY MOUTH EVERY DAY, Disp: 90 tablet, Rfl: 1   methenamine (HIPREX) 1 g tablet, Take 1 g by mouth 2 (two) times daily with a meal. Take with $RemoveB'500mg'NNRhQumg$  of vitamin c w/ each tablet, Disp: , Rfl:    methocarbamol (ROBAXIN) 500 MG tablet, Take 500 mg by mouth every 8 (eight) hours as needed., Disp: , Rfl:    omeprazole (PRILOSEC) 40 MG capsule, Take 1 capsule (40 mg total) by mouth daily., Disp: 90 capsule, Rfl: 3   ondansetron (ZOFRAN-ODT) 4 MG disintegrating tablet, Take 1 tablet (4 mg total) by mouth every 8 (eight)  hours as needed for nausea or vomiting., Disp: 30 tablet, Rfl: 0   Probiotic Product (PROBIOTIC PO), Take by mouth., Disp: , Rfl:    valACYclovir (VALTREX) 1000 MG tablet, Take 1 tablet (1,000 mg total) by mouth daily for 5 days., Disp: 5 tablet, Rfl: 0   Allergies  Allergen Reactions   Doxycycline Rash   Other Other (See Comments)    Surgical glue : hives   Oxycodone Nausea And Vomiting   Sulfa Antibiotics Hives   Trimethoprim Sulfate [Trimethoprim]     rash   Codeine Nausea Only   Propranolol Hcl Other (See Comments)    "Dizziness and heart racing"    Past Medical History:  Diagnosis Date   Arthritis    Breast cancer, left [facility]) oncologist-- dr [name]   dx 09-26-2017--- IDC, Stage IA, Grade 2, ER/PR +;  HER2 negative;  s/p  breast lumpectomy w/ node dissection 11-02-2017;  completed radiation 01-09-2018 (left breast genetic screening panel 2017 with variant of unknown significance AXINA)   COPD (chronic obstructive pulmonary disease) ([place])    pulmologist-- dr Dillard Essex-- w/ chronic lingular scarring  (last exceratbation bronchiectasis 07/ 2020)   Dyslipidemia    followed by cardiology/ vascular-- dr g. adams ([facility] in [place])  family history strokes   Eczema    Family history of  breast cancer    Family history of colon cancer    GERD (gastroesophageal reflux disease)    Hiatal hernia    History of esophageal stricture    s/p dilatation 2008   History of external beam radiation therapy    left breast 12-19-2017  to 01-09-2018   History of hyperthyroidism    endocrinologist-- (lov 04-11-2019 epic) dr Loanne Drilling, dx 2009 due to multinodular goiter ,  had taken medication for few months 2010 stopped due to norma TFT;     Insomnia    Multinodular goiter last thyroid ultrasound in epic 12-18-2018   endocrinologist-- dr Loanne Drilling,  dx 2009 w/ hyperthroidism with medication until normal TFT in 2010;     Osteopenia 02/2018   T score -2.4 distal third of radius.  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   Wears contact lenses      Past Surgical History:  Procedure Laterality Date   BREAST LUMPECTOMY WITH RADIOACTIVE SEED AND SENTINEL LYMPH NODE BIOPSY Left 11/02/2017   Procedure: LEFT BREAST LUMPECTOMY WITH RADIOACTIVE SEED AND LEFT AXILLARY DEEP SENTINEL LYMPH NODE BIOPSY, INJECT BLUE DYE LEFT BREAST;  Surgeon: [name], MD;  Location: [place];  Service: General;  Laterality: Left;   BREAST SURGERY  2000   Breast cyst removed, left   LAPAROSCOPIC BILATERAL SALPINGO OOPHERECTOMY Bilateral 10/13/2019   Procedure: LAPAROSCOPIC BILATERAL SALPINGO OOPHORECTOMY LYSIS OF ADHESIONS PERITONEAL WASHINGS ;  Surgeon: [name], MD;  Location: [place];  Service: Gynecology;  Laterality: Bilateral;  request 7:30am OR time in [place] block requests one hour   TONSILLECTOMY  age 89   TUBAL LIGATION Bilateral yrs ago   VAGINAL HYSTERECTOMY  1991   Right ovarian cystectomy   VIDEO BRONCHOSCOPY Bilateral 03/16/2015   Procedure: VIDEO BRONCHOSCOPY WITHOUT FLUORO;  Surgeon: Brand Males, MD;  Location: [facility] ENDOSCOPY;  Service: Endoscopy;  Laterality: Bilateral;    Family History  Problem Relation Age of Onset   Osteoporosis Mother    Lung cancer Mother    Stroke Mother    Diabetes Father    Hypertension Father    Osteoporosis Father    Colon cancer Father 41   Breast cancer Sister        Age 47   Leukemia Maternal Grandfather    Crohn's disease Son    Ovarian cancer Maternal Aunt 4   Breast cancer Maternal Aunt        great aunt- Age unknown   Colon cancer  Paternal Uncle 65   Breast cancer Cousin        maternal-Age 40   Colon cancer Cousin        maternal first cousin    Social History   Tobacco Use   Smoking status: Former    Packs/day: 0.10    Years: 30.00    Total pack years: 3.00    Types: Cigarettes    Quit date: 10/31/1995    Years since quitting: 26.6   Smokeless tobacco: Never  Vaping Use   Vaping Use: Never used  Substance Use Topics   Alcohol use: Not Currently  Comment: seldom   Drug use: No    ROS   Objective:   Vitals: BP (!) 153/77   Pulse 77   Temp 98.6 F (37 C)   Resp 20   SpO2 96%   Physical Exam Constitutional:      General: She is not in acute distress.    Appearance: Normal appearance. She is well-developed. She is not ill-appearing, toxic-appearing or diaphoretic.  HENT:     Head: Normocephalic and atraumatic.     Nose: Nose normal.     Mouth/Throat:     Mouth: Mucous membranes are moist.  Eyes:     General: No scleral icterus.       Right eye: No discharge.        Left eye: No discharge.     Extraocular Movements: Extraocular movements intact.     Conjunctiva/sclera: Conjunctivae normal.  Cardiovascular:     Rate and Rhythm: Normal rate.  Pulmonary:     Effort: Pulmonary effort is normal.  Abdominal:     General: Bowel sounds are normal. There is no distension.     Palpations: Abdomen is soft. There is no mass.     Tenderness: There is no abdominal tenderness. There is no right CVA tenderness, left CVA tenderness, guarding or rebound.  Skin:    General: Skin is warm and dry.  Neurological:     General: No focal deficit present.     Mental Status: She is alert and oriented to person, place, and time.  Psychiatric:        Mood and Affect: Mood normal.        Behavior: Behavior normal.        Thought Content: Thought content normal.        Judgment: Judgment normal.     Results for orders placed or performed during the hospital encounter of 06/17/22 (from the past 24  hour(s))  POCT urinalysis dipstick     Status: Abnormal   Collection Time: 06/17/22 12:35 PM  Result Value Ref Range   Color, UA orange (A) yellow   Clarity, UA cloudy (A) clear   Glucose, UA negative negative mg/dL   Bilirubin, UA negative negative   Ketones, POC UA negative negative mg/dL   Spec Grav, UA <=1.005 (A) 1.010 - 1.025   Blood, UA small (A) negative   pH, UA 5.5 5.0 - 8.0   Protein Ur, POC negative negative mg/dL   Urobilinogen, UA 0.2 0.2 or 1.0 E.U./dL   Nitrite, UA Negative Negative   Leukocytes, UA Large (3+) (A) Negative    Assessment and Plan :   PDMP not reviewed this encounter.  1. Acute cystitis with hematuria    Start Keflex to cover for acute cystitis, urine culture pending.  Recommended aggressive hydration, limiting urinary irritants. Counseled patient on potential for adverse effects with medications prescribed/recommended today, ER and return-to-clinic precautions discussed, patient verbalized understanding.    Jaynee Eagles, PA-C 06/17/22 1243

## 2022-06-19 LAB — SURESWAB HSV, TYPE 1/2 DNA, PCR
HSV 1 DNA: DETECTED — AB
HSV 2 DNA: NOT DETECTED

## 2022-06-19 LAB — URINE CULTURE: Culture: >=100000 — AB

## 2022-06-26 ENCOUNTER — Encounter: Payer: Self-pay | Admitting: *Deleted

## 2022-06-26 ENCOUNTER — Ambulatory Visit (INDEPENDENT_AMBULATORY_CARE_PROVIDER_SITE_OTHER): Payer: Medicare PPO

## 2022-06-26 VITALS — Ht 63.5 in | Wt 143.0 lb

## 2022-06-26 DIAGNOSIS — Z0000 Encounter for general adult medical examination without abnormal findings: Secondary | ICD-10-CM | POA: Diagnosis not present

## 2022-06-26 NOTE — Progress Notes (Signed)
Subjective:   Cynthia Thornton is a 76 y.o. female who presents for Medicare Annual (Subsequent) preventive examination.  I connected with Rudine today by telephone and verified that I am speaking with the correct person using two identifiers. Location patient: home Location provider: work Persons participating in the virtual visit: patient, Marine scientist.    I discussed the limitations, risks, security and privacy concerns of performing an evaluation and management service by telephone and the availability of in person appointments. I also discussed with the patient that there may be a patient responsible charge related to this service. The patient expressed understanding and verbally consented to this telephonic visit.    Interactive audio and video telecommunications were attempted between this provider and patient, however failed, due to patient having technical difficulties OR patient did not have access to video capability.  We continued and completed visit with audio only.  Some vital signs may be absent or patient reported.   Time Spent with patient on telephone encounter: 30 minutes   Review of Systems     Cardiac Risk Factors include: advanced age (>41mn, >>1women);dyslipidemia;sedentary lifestyle     Objective:    Today's Vitals   06/26/22 1449  Weight: 143 lb (64.9 kg)  Height: 5' 3.5" (1.613 m)   Body mass index is 24.93 kg/m.     06/26/2022    2:54 PM 01/06/2021    3:25 PM 04/23/2020    7:14 PM 01/02/2020   10:25 AM 10/13/2019    6:07 AM 09/10/2019    3:38 PM 12/16/2018    9:25 AM  Advanced Directives  Does Patient Have a Medical Advance Directive? Yes Yes Yes No Yes No Yes  Type of AParamedicof [place] will [place] will [place] LakeLiving will  Healthcare Power of [place]Living will  Does patient want to make changes to medical advance directive?   No - Patient  declined  No - Patient declined  No - Patient declined  Copy of [place] Chart? No - copy requested No - copy requested No - copy requested  No - copy requested  No - copy requested  Would patient like information on creating a medical advance directive?    No - Patient declined  No - Patient declined     Current Medications (verified) Outpatient Encounter Medications as of 06/26/2022  Medication Sig   B Complex-C (B-COMPLEX WITH VITAMIN C) tablet Take 1 tablet by mouth daily.   cetirizine (ZYRTEC) 10 MG tablet Take 10 mg by mouth daily.   Cholecalciferol (VITAMIN D) 2000 units CAPS Take 2,000 Units by mouth daily.   clobetasol ointment (TEMOVATE) 07.89% Apply 1 Application topically 2 (two) times daily. Thin application on affected skin of the vulva and perianal area twice a day x 2 weeks.   clorazepate (TRANXENE) 7.5 MG tablet Take 1 tablet (7.5 mg total) by mouth at bedtime as needed for anxiety.   CRANBERRY PO Take 2 tablets by mouth in the morning and at bedtime.   diazepam (VALIUM) 2 MG tablet Take 2 mg by mouth every 6 (six) hours as needed.   famotidine (PEPCID) 20 MG tablet Take 1 tablet (20 mg total) by mouth daily.   letrozole (FEMARA) 2.5 MG tablet TAKE 1 TABLET BY MOUTH EVERY DAY   methenamine (HIPREX) 1 g tablet Take 1 g by mouth 2 (two) times daily with a meal. Take with 5077mof  vitamin c w/ each tablet   methocarbamol (ROBAXIN) 500 MG tablet Take 500 mg by mouth every 8 (eight) hours as needed.   omeprazole (PRILOSEC) 40 MG capsule Take 1 capsule (40 mg total) by mouth daily.   ondansetron (ZOFRAN-ODT) 4 MG disintegrating tablet Take 1 tablet (4 mg total) by mouth every 8 (eight) hours as needed for nausea or vomiting.   Probiotic Product (PROBIOTIC PO) Take by mouth.   [DISCONTINUED] cephALEXin (KEFLEX) 500 MG capsule Take 1 capsule (500 mg total) by mouth 2 (two) times daily.   No facility-administered encounter medications on file as of 06/26/2022.     Allergies (verified) Doxycycline, Other, Oxycodone, Sulfa antibiotics, Trimethoprim sulfate [trimethoprim], Codeine, and Propranolol hcl   History: Past Medical History:  Diagnosis Date   Arthritis    Breast cancer, left [facility]) oncologist-- dr [name]   dx 09-26-2017--- IDC, Stage IA, Grade 2, ER/PR +;  HER2 negative;  s/p  breast lumpectomy w/ node dissection 11-02-2017;  completed radiation 01-09-2018 (left breast genetic screening panel 2017 with variant of unknown significance AXINA)   COPD (chronic obstructive pulmonary disease) ([place])    pulmologist-- dr Dillard Essex-- w/ chronic lingular scarring  (last exceratbation bronchiectasis 07/ 2020)   Dyslipidemia    followed by cardiology/ vascular-- dr g. adams ([facility] in [place])  family history strokes   Eczema    Family history of breast cancer    Family history of colon cancer    GERD (gastroesophageal reflux disease)    Hiatal hernia    History of esophageal stricture    s/p dilatation 2008   History of external beam radiation therapy    left breast 12-19-2017  to 01-09-2018   History of hyperthyroidism    endocrinologist-- (lov 04-11-2019 epic) dr Loanne Drilling, dx 2009 due to multinodular goiter ,  had taken medication for few months 2010 stopped due to norma TFT;     Insomnia    Multinodular goiter last thyroid ultrasound in epic 12-18-2018   endocrinologist-- dr Loanne Drilling,  dx 2009 w/ hyperthroidism with medication until normal TFT in 2010;     Osteopenia 02/2018   T score -2.4 distal third of radius.  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   Wears contact lenses    Past  Surgical History:  Procedure Laterality Date   BREAST LUMPECTOMY WITH RADIOACTIVE SEED AND SENTINEL LYMPH NODE BIOPSY Left 11/02/2017   Procedure: LEFT BREAST LUMPECTOMY WITH RADIOACTIVE SEED AND LEFT AXILLARY DEEP SENTINEL LYMPH NODE BIOPSY, INJECT BLUE DYE LEFT BREAST;  Surgeon: [name], MD;  Location: [place];  Service: General;  Laterality: Left;   BREAST SURGERY  2000   Breast cyst removed, left   LAPAROSCOPIC BILATERAL SALPINGO OOPHERECTOMY Bilateral 10/13/2019   Procedure: LAPAROSCOPIC BILATERAL SALPINGO OOPHORECTOMY LYSIS OF ADHESIONS PERITONEAL WASHINGS ;  Surgeon: [name], MD;  Location: [place];  Service: Gynecology;  Laterality: Bilateral;  request 7:30am OR time in [place] block requests one hour   TONSILLECTOMY  age 40   TUBAL LIGATION Bilateral yrs ago   [place]  Right ovarian cystectomy   VIDEO BRONCHOSCOPY Bilateral 03/16/2015   Procedure: VIDEO BRONCHOSCOPY WITHOUT FLUORO;  Surgeon: Brand Males, MD;  Location[facility];  Service: Endoscopy;  Laterality: Bilateral;   Family History  Problem Relation Age of Onset   Osteoporosis Mother    Lung cancer Mother    Stroke Mother    Diabetes Father    Hypertension Father    Osteoporosis Father    Colon cancer Father 76   Breast cancer Sister        Age 50   Leukemia Maternal Grandfather    Crohn's disease Son    Ovarian cancer Maternal Aunt 28   Breast cancer Maternal Aunt        great aunt- Age unknown   Colon cancer Paternal Uncle 41   Breast cancer Cousin        maternal-Age 19   Colon cancer Cousin        maternal first cousin   Social History   Socioeconomic History   Marital status: Single    Spouse name: Not on file   Number of children: 2   Years of education: Not on file   Highest education level: Not on file  Occupational History   Occupation: Pharmacist, hospital, works part time     Fish farm manager: [employer]  Tobacco  Use   Smoking status: Former    Packs/day: 0.10    Years: 30.00    Total pack years: 3.00    Types: Cigarettes    Quit date: 10/31/1995    Years since quitting: 26.6   Smokeless tobacco: Never  Vaping Use   Vaping Use: Never used  Substance and Sexual Activity   Alcohol use: Not Currently    Comment: seldom   Drug use: No   Sexual activity: Not Currently    Birth control/protection: Surgical    Comment: 1st intercourse 76 yo-Fewer than 5 partners  Other Topics Concern   Not on file  Social History Narrative   Lives by herself   2 children, one has mental issues    Social Determinants of Health   Financial Resource Strain: Low Risk  (06/26/2022)   Overall Financial Resource Strain (CARDIA)    Difficulty of Paying Living Expenses: Not hard at all  Food Insecurity: No Food Insecurity (06/26/2022)   Hunger Vital Sign    Worried About Running Out of Food in the Last Year: Never true    Ran Out of Food in the Last Year: Never true  Transportation Needs: No Transportation Needs (06/26/2022)   PRAPARE - Hydrologist (Medical): No    Lack of Transportation (Non-Medical): No  Physical Activity: Inactive (06/26/2022)   Exercise Vital Sign    Days of Exercise per Week: 0 days    Minutes of Exercise per Session: 0 min  Stress: No Stress Concern Present (06/26/2022)   [place]    Feeling of Stress : Not at all  Social Connections: Moderately Integrated (01/06/2021)   Social Connection and Isolation Panel [NHANES]    Frequency of Communication with Friends and Family: More than three times a week    Frequency of Social Gatherings with Friends and Family: More than three times a week    Attends Religious Services: More than 4 times per year    Active Member of [employer] or Organizations: Yes    Attends Archivist Meetings: More than 4 times per year  Marital Status: Never married     Tobacco Counseling Counseling given: Not Answered   Clinical Intake:  Pre-visit preparation completed: Yes  Pain : No/denies pain     BMI - recorded: 24.93 Nutritional Status: BMI of 19-24  Normal Nutritional Risks: None Diabetes: No     Diabetic?No  Interpreter Needed?: No  Information entered by :: Caroleen Hamman LPN   Activities of Daily Living    06/26/2022    2:59 PM  In your present state of health, do you have any difficulty performing the following activities:  Hearing? 0  Vision? 0  Difficulty concentrating or making decisions? 0  Walking or climbing stairs? 1  Comment stairs  Dressing or bathing? 0  Doing errands, shopping? 0  Preparing Food and eating ? N  Using the Toilet? N  In the past six months, have you accidently leaked urine? N  Do you have problems with loss of bowel control? N  Managing your Medications? N  Managing your Finances? N  Housekeeping or managing your Housekeeping? N    Patient Care Team: Colon Branch, MD as PCP - Gaston Islam, MD as Consulting Physician (Orthopedic Surgery) Nicholas Lose, MD as Consulting Physician (Hematology and Oncology) [name], MD as Consulting Physician (General Surgery) Brand Males, MD as Consulting Physician (Pulmonary Disease) Kyung Rudd, MD as Consulting Physician (Radiation Oncology) Delice Bison, Charlestine Massed, NP as Nurse Practitioner (Hematology and Oncology) Ardis Hughs, MD as Attending Physician (Urology) Melida Quitter, MD as Consulting Physician (Otolaryngology) Patrici Ranks. (Inactive) (Cardiology) [name], MD as Consulting Physician (Obstetrics and Gynecology)  Indicate any recent Medical Services you may have received from other than [facility] providers in the past year (date may be approximate).     Assessment:   This is a routine wellness examination for Enza.  Hearing/Vision screen Hearing Screening - Comments:: No issues Vision  Screening - Comments:: Last eye exam-02/2022-Dr. Mccuen  Dietary issues and exercise activities discussed: Current Exercise Habits: The patient does not participate in regular exercise at present, Exercise limited by: orthopedic condition(s)   Goals Addressed               This Visit's Progress     Patient Stated     Begin exercising (pt-stated)   Not on track     Other     Continue working!   On track      Depression Screen    06/26/2022    2:59 PM 03/10/2022    7:58 AM 01/06/2021    3:31 PM 01/02/2020   10:29 AM 12/26/2019    9:59 AM 12/16/2018    9:28 AM 02/14/2018   10:05 AM  PHQ 2/9 Scores  PHQ - 2 Score 0 0 0 0 0 0 0    Fall Risk    06/26/2022    2:55 PM 03/10/2022    7:58 AM 01/07/2021   11:09 AM 01/06/2021    3:28 PM 01/02/2020   10:28 AM  Fall Risk   Falls in the past year? 1 0 0 1 0  Number falls in past yr: 0 0 0 0 0  Injury with Fall? 0 0 0 0 0  Risk for fall due to :    History of fall(s)   Follow up Falls prevention discussed Falls evaluation completed Falls evaluation completed Falls prevention discussed Education provided;Falls prevention discussed    FALL RISK PREVENTION PERTAINING TO THE HOME:  Any stairs in or around the home? No  Home  free of loose throw rugs in walkways, pet beds, electrical cords, etc? Yes  Adequate lighting in your home to reduce risk of falls? Yes   ASSISTIVE DEVICES UTILIZED TO PREVENT FALLS:  Life alert? No  Use of a cane, walker or w/c? No  Grab bars in the bathroom? Yes  Shower chair or bench in shower? Yes  Elevated toilet seat or a handicapped toilet? No   TIMED UP AND GO:  Was the test performed? No . Phone visit  Cognitive Function:    12/08/2016    2:27 PM  MMSE - Mini Mental State Exam  Orientation to time 5  Orientation to Place 5  Registration 3  Attention/ Calculation 5  Recall 3  Language- name 2 objects 2  Language- repeat 1  Language- follow 3 step command 3  Language- read & follow direction 1   Write a sentence 1  Copy design 1  Total score 30        06/26/2022    3:08 PM  6CIT Screen  What Year? 0 points  What month? 0 points  What time? 0 points  Count back from 20 0 points  Months in reverse 0 points  Repeat phrase 0 points  Total Score 0 points    Immunizations Immunization History  Administered Date(s) Administered   Influenza Split 08/31/2011, 07/30/2014   Influenza Whole 08/30/2009, 07/30/2010   Influenza, High Dose Seasonal PF 07/05/2018, 08/02/2019, 08/15/2021   Influenza,inj,Quad PF,6+ Mos 09/30/2012, 10/10/2013, 07/26/2015   Influenza-Unspecified 08/18/2016, 07/30/2020   Moderna Sars-Covid-2 Vaccination 11/29/2019, 12/29/2019, 10/14/2020, 05/19/2021   Pneumococcal Conjugate-13 10/10/2013   Pneumococcal Polysaccharide-23 07/27/2015   Td 12/02/2008   Tdap 01/03/2019   Zoster Recombinat (Shingrix) 01/03/2019, 04/18/2019   Zoster, Live 12/02/2008    TDAP status: Up to date  Flu Vaccine status: Up to date  Pneumococcal vaccine status: Up to date  Covid-19 vaccine status: Information provided on how to obtain vaccines.   Qualifies for Shingles Vaccine? No   Zostavax completed Yes   Shingrix Completed?: Yes  Screening Tests Health Maintenance  Topic Date Due   COVID-19 Vaccine (5 - Moderna risk series) 07/14/2021   INFLUENZA VACCINE  07/31/2022 (Originally 05/30/2022)   MAMMOGRAM  09/26/2022   COLONOSCOPY (Pts 45-60yrs Insurance coverage will need to be confirmed)  05/28/2024   TETANUS/TDAP  01/02/2029   Pneumonia Vaccine 71+ Years old  Completed   DEXA SCAN  Completed   Hepatitis C Screening  Completed   Zoster Vaccines- Shingrix  Completed   HPV VACCINES  Aged Out    Health Maintenance  Health Maintenance Due  Topic Date Due   COVID-19 Vaccine (5 - Moderna risk series) 07/14/2021    Colorectal cancer screening: Type of screening: Colonoscopy. Completed 05/29/2019. Repeat every 5 years  Mammogram status: Completed bilateral  09/26/2021. Repeat every year  Bone Density status: Completed 06/13/2022. Results reflect: Bone density results: OSTEOPENIA. Repeat every 2 years.  Lung Cancer Screening: (Low Dose CT Chest recommended if Age 64-80 years, 30 pack-year currently smoking OR have quit w/in 15years.) does not qualify.     Additional Screening:  Hepatitis C Screening: Completed 07/27/2015  Vision Screening: Recommended annual ophthalmology exams for early detection of glaucoma and other disorders of the eye. Is the patient up to date with their annual eye exam?  Yes  Who is the provider or what is the name of the office in which the patient attends annual eye exams? Dr. Ellie Lunch   Dental Screening: Recommended annual dental  exams for proper oral hygiene  Community Resource Referral / Chronic Care Management: CRR required this visit?  No   CCM required this visit?  No      Plan:     I have personally reviewed and noted the following in the patient's chart:   Medical and social history Use of alcohol, tobacco or illicit drugs  Current medications and supplements including opioid prescriptions. Patient is not currently taking opioid prescriptions. Functional ability and status Nutritional status Physical activity Advanced directives List of other physicians Hospitalizations, surgeries, and ER visits in previous 12 months Vitals Screenings to include cognitive, depression, and falls Referrals and appointments  In addition, I have reviewed and discussed with patient certain preventive protocols, quality metrics, and best practice recommendations. A written personalized care plan for preventive services as well as general preventive health recommendations were provided to patient.    Due to this being a telephonic visit, the after visit summary with patients personalized plan was offered to patient via mail or my-chart. Patient would like to access on my-chart.   Marta Antu, LPN   02/23/8340   Nurse Health Advisor  Nurse Notes: None

## 2022-06-26 NOTE — Patient Instructions (Signed)
[name] , Thank you for taking time to complete your Medicare Wellness Visit. I appreciate your ongoing commitment to your health goals. Please review the following plan we discussed and let me know if I can assist you in the future.   Screening recommendations/referrals: Colonoscopy: Completed 05/29/2019-Due 05/28/2024 Mammogram: Completed 09/26/2021-Die 09/26/2022 Bone Density: Completed 06/13/2022-Due 06/13/2024 Recommended yearly ophthalmology/optometry visit for glaucoma screening and checkup Recommended yearly dental visit for hygiene and checkup  Vaccinations: Influenza vaccine: Up to date Pneumococcal vaccine: Up to date Tdap vaccine: Up to date Shingles vaccine: Completed vaccines   Covid-19:Booster available at your local pharmacy  Advanced directives: Please bring a copy of Living Will and/or Healthcare Power of Attorney for your chart.   Conditions/risks identified: See problem list  Next appointment: Follow up in one year for your annual wellness visit    Preventive Care 65 Years and Older, Female Preventive care refers to lifestyle choices and visits with your health care provider that can promote health and wellness. What does preventive care include? A yearly physical exam. This is also called an annual well check. Dental exams once or twice a year. Routine eye exams. Ask your health care provider how often you should have your eyes checked. Personal lifestyle choices, including: Daily care of your teeth and gums. Regular physical activity. Eating a healthy diet. Avoiding tobacco and drug use. Limiting alcohol use. Practicing safe sex. Taking low-dose aspirin every day. Taking vitamin and mineral supplements as recommended by your health care provider. What happens during an annual well check? The services and screenings done by your health care provider during your annual well check will depend on your age, overall health, lifestyle risk factors, and family  history of disease. Counseling  Your health care provider may ask you questions about your: Alcohol use. Tobacco use. Drug use. Emotional well-being. Home and relationship well-being. Sexual activity. Eating habits. History of falls. Memory and ability to understand (cognition). Work and work Statistician. Reproductive health. Screening  You may have the following tests or measurements: Height, weight, and BMI. Blood pressure. Lipid and cholesterol levels. These may be checked every 5 years, or more frequently if you are over 65 years old. Skin check. Lung cancer screening. You may have this screening every year starting at age 41 if you have a 30-pack-year history of smoking and currently smoke or have quit within the past 15 years. Fecal occult blood test (FOBT) of the stool. You may have this test every year starting at age 34. Flexible sigmoidoscopy or colonoscopy. You may have a sigmoidoscopy every 5 years or a colonoscopy every 10 years starting at age 40. Hepatitis C blood test. Hepatitis B blood test. Sexually transmitted disease (STD) testing. Diabetes screening. This is done by checking your blood sugar (glucose) after you have not eaten for a while (fasting). You may have this done every 1-3 years. Bone density scan. This is done to screen for osteoporosis. You may have this done starting at age 41. Mammogram. This may be done every 1-2 years. Talk to your health care provider about how often you should have regular mammograms. Talk with your health care provider about your test results, treatment options, and if necessary, the need for more tests. Vaccines  Your health care provider may recommend certain vaccines, such as: Influenza vaccine. This is recommended every year. Tetanus, diphtheria, and acellular pertussis (Tdap, Td) vaccine. You may need a Td booster every 10 years. Zoster vaccine. You may need this after age 39. Pneumococcal  13-valent conjugate (PCV13)  vaccine. One dose is recommended after age 12. Pneumococcal polysaccharide (PPSV23) vaccine. One dose is recommended after age 35. Talk to your health care provider about which screenings and vaccines you need and how often you need them. This information is not intended to replace advice given to you by your health care provider. Make sure you discuss any questions you have with your health care provider. Document Released: 11/12/2015 Document Revised: 07/05/2016 Document Reviewed: 08/17/2015 Elsevier Interactive Patient Education  2017 [employer] Prevention in the Home Falls can cause injuries. They can happen to people of all ages. There are many things you can do to make your home safe and to help prevent falls. What can I do on the outside of my home? Regularly fix the edges of walkways and driveways and fix any cracks. Remove anything that might make you trip as you walk through a door, such as a raised step or threshold. Trim any bushes or trees on the path to your home. Use bright outdoor lighting. Clear any walking paths of anything that might make someone trip, such as rocks or tools. Regularly check to see if handrails are loose or broken. Make sure that both sides of any steps have handrails. Any raised decks and porches should have guardrails on the edges. Have any leaves, snow, or ice cleared regularly. Use sand or salt on walking paths during winter. Clean up any spills in your garage right away. This includes oil or grease spills. What can I do in the bathroom? Use night lights. Install grab bars by the toilet and in the tub and shower. Do not use towel bars as grab bars. Use non-skid mats or decals in the tub or shower. If you need to sit down in the shower, use a plastic, non-slip stool. Keep the floor dry. Clean up any water that spills on the floor as soon as it happens. Remove soap buildup in the tub or shower regularly. Attach bath mats securely with  double-sided non-slip rug tape. Do not have throw rugs and other things on the floor that can make you trip. What can I do in the bedroom? Use night lights. Make sure that you have a light by your bed that is easy to reach. Do not use any sheets or blankets that are too big for your bed. They should not hang down onto the floor. Have a firm chair that has side arms. You can use this for support while you get dressed. Do not have throw rugs and other things on the floor that can make you trip. What can I do in the kitchen? Clean up any spills right away. Avoid walking on wet floors. Keep items that you use a lot in easy-to-reach places. If you need to reach something above you, use a strong step stool that has a grab bar. Keep electrical cords out of the way. Do not use floor polish or wax that makes floors slippery. If you must use wax, use non-skid floor wax. Do not have throw rugs and other things on the floor that can make you trip. What can I do with my stairs? Do not leave any items on the stairs. Make sure that there are handrails on both sides of the stairs and use them. Fix handrails that are broken or loose. Make sure that handrails are as long as the stairways. Check any carpeting to make sure that it is firmly attached to the stairs. Fix any carpet  that is loose or worn. Avoid having throw rugs at the top or bottom of the stairs. If you do have throw rugs, attach them to the floor with carpet tape. Make sure that you have a light switch at the top of the stairs and the bottom of the stairs. If you do not have them, ask someone to add them for you. What else can I do to help prevent falls? Wear shoes that: Do not have high heels. Have rubber bottoms. Are comfortable and fit you well. Are closed at the toe. Do not wear sandals. If you use a stepladder: Make sure that it is fully opened. Do not climb a closed stepladder. Make sure that both sides of the stepladder are locked  into place. Ask someone to hold it for you, if possible. Clearly mark and make sure that you can see: Any grab bars or handrails. First and last steps. Where the edge of each step is. Use tools that help you move around (mobility aids) if they are needed. These include: Canes. Walkers. Scooters. Crutches. Turn on the lights when you go into a dark area. Replace any light bulbs as soon as they burn out. Set up your furniture so you have a clear path. Avoid moving your furniture around. If any of your floors are uneven, fix them. If there are any pets around you, be aware of where they are. Review your medicines with your doctor. Some medicines can make you feel dizzy. This can increase your chance of falling. Ask your doctor what other things that you can do to help prevent falls. This information is not intended to replace advice given to you by your health care provider. Make sure you discuss any questions you have with your health care provider. Document Released: 08/12/2009 Document Revised: 03/23/2016 Document Reviewed: 11/20/2014 Elsevier Interactive Patient Education  2017 [employer].

## 2022-06-28 ENCOUNTER — Encounter: Payer: Self-pay | Admitting: Obstetrics & Gynecology

## 2022-06-28 ENCOUNTER — Ambulatory Visit: Payer: Medicare PPO | Admitting: Obstetrics & Gynecology

## 2022-06-28 ENCOUNTER — Telehealth: Payer: Self-pay

## 2022-06-28 VITALS — BP 112/70

## 2022-06-28 DIAGNOSIS — B009 Herpesviral infection, unspecified: Secondary | ICD-10-CM | POA: Diagnosis not present

## 2022-06-28 DIAGNOSIS — N8110 Cystocele, unspecified: Secondary | ICD-10-CM | POA: Diagnosis not present

## 2022-06-28 DIAGNOSIS — N766 Ulceration of vulva: Secondary | ICD-10-CM

## 2022-06-28 DIAGNOSIS — N393 Stress incontinence (female) (male): Secondary | ICD-10-CM

## 2022-06-28 MED ORDER — VALACYCLOVIR HCL 500 MG PO TABS: 500.0000 mg | ORAL_TABLET | Freq: Every day | ORAL | 4 refills | Status: DC

## 2022-06-28 NOTE — Telephone Encounter (Signed)
Refer to PT for Pelvic Floor reinforcement Received: Today Cynthia Bruins, MD  [facility] Triage SUI/Cystocele.

## 2022-06-28 NOTE — Progress Notes (Signed)
    Cynthia Thornton January 23, 1946 [id number]        76 y.o.  [id number]   RP: F/U vulvar/perineal ulcers  HPI: Improved vulvar/perianal ulcers.  HSV 1 came out positive.  H/O mouth ulcers as a child.  C/O a bulge at the vulva after standing for a while.  SUI especially when the bladder is full.   OB History  Gravida Para Term Preterm AB Living  '3 2 2   1 2  '$ SAB IAB Ectopic Multiple Live Births  1            # Outcome Date GA Lbr Len/2nd Weight Sex Delivery Anes PTL Lv  3 SAB           2 Term           1 Term             Past medical history,surgical history, problem list, medications, allergies, family history and social history were all reviewed and documented in the EPIC chart.   Directed ROS with pertinent positives and negatives documented in the history of present illness/assessment and plan.  Exam:  Vitals:   06/28/22 1343  BP: 112/70   General appearance:  Normal  Gynecologic exam: Vulvar and perianal ulcers improved.  Perineal shallow ulceration still present Rt > Lt.  Bimanual exam with Valsalva:  Cystocele grade 3/4.   Assessment/Plan:  76 y.o. [id number]   1. Vulvar and perianal ulceration Improved after Valacyclovir treatment.  HSV-1 positive.  Counseling done, patient reassured.  Decision to start on prophylaxis with Valacyclovir 500 mg PO daily.  Usage reviewed, prescription sent to pharmacy.  2. HSV-1 (herpes simplex virus 1) infection As above.  3. Baden-Walker grade 3 cystocele Counseling done on Cystocele.  Patient was concerned, but not having pain.  Prevention of progression by avoiding pelvic floor pressure and not letting the bladder overfill.  Kegels recommended.  Decision to refer to PT for pelvic floor reinforcement as patient also has SUI.  Eventual pessary and surgical correction discussed.  4. SUI (stress urinary incontinence, female) Refer to Physical Therapy for Pelvic Floor reinforcement.  Other orders - valACYclovir (VALTREX) 500 MG tablet;  Take 1 tablet (500 mg total) by mouth daily. 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   [name] MD, 2:06 PM 06/28/2022

## 2022-07-02 ENCOUNTER — Telehealth: Payer: Self-pay | Admitting: Internal Medicine

## 2022-07-02 NOTE — Telephone Encounter (Signed)
CT chest in Nov 2022 was stable. Lasg visit oct 2022. She needs 1 year followup someitme in Oct-nov 2022 15 min slot

## 2022-07-05 NOTE — Telephone Encounter (Signed)
Appt scheduled for 08/28/22

## 2022-07-07 DIAGNOSIS — R42 Dizziness and giddiness: Secondary | ICD-10-CM | POA: Diagnosis not present

## 2022-07-11 ENCOUNTER — Encounter: Payer: Self-pay | Admitting: Internal Medicine

## 2022-07-11 NOTE — Telephone Encounter (Signed)
Scheduled 07/20/22 at 4:15pm

## 2022-07-17 ENCOUNTER — Other Ambulatory Visit (INDEPENDENT_AMBULATORY_CARE_PROVIDER_SITE_OTHER): Payer: Medicare PPO

## 2022-07-17 DIAGNOSIS — E871 Hypo-osmolality and hyponatremia: Secondary | ICD-10-CM

## 2022-07-18 LAB — BASIC METABOLIC PANEL
BUN: 22 mg/dL (ref 6–23)
CO2: 26 mEq/L (ref 19–32)
Calcium: 9.4 mg/dL (ref 8.4–10.5)
Chloride: 95 mEq/L — ABNORMAL LOW (ref 96–112)
Creatinine, Ser: 0.98 mg/dL (ref 0.40–1.20)
GFR: 56.94 mL/min — ABNORMAL LOW (ref >60.00)
Glucose, Bld: 81 mg/dL (ref 70–99)
Potassium: 4.4 mEq/L (ref 3.5–5.1)
Sodium: 130 mEq/L — ABNORMAL LOW (ref 135–145)

## 2022-07-20 ENCOUNTER — Ambulatory Visit: Payer: Medicare PPO | Attending: Obstetrics & Gynecology | Admitting: Physical Therapy

## 2022-07-20 ENCOUNTER — Other Ambulatory Visit: Payer: Self-pay

## 2022-07-20 ENCOUNTER — Encounter: Payer: Self-pay | Admitting: Physical Therapy

## 2022-07-20 DIAGNOSIS — N8110 Cystocele, unspecified: Secondary | ICD-10-CM | POA: Diagnosis not present

## 2022-07-20 DIAGNOSIS — N393 Stress incontinence (female) (male): Secondary | ICD-10-CM | POA: Insufficient documentation

## 2022-07-20 DIAGNOSIS — R279 Unspecified lack of coordination: Secondary | ICD-10-CM | POA: Insufficient documentation

## 2022-07-20 DIAGNOSIS — M6281 Muscle weakness (generalized): Secondary | ICD-10-CM | POA: Insufficient documentation

## 2022-07-20 DIAGNOSIS — R293 Abnormal posture: Secondary | ICD-10-CM | POA: Insufficient documentation

## 2022-07-20 NOTE — Therapy (Signed)
OUTPATIENT PHYSICAL THERAPY FEMALE PELVIC EVALUATION   Patient Name: Cynthia Thornton MRN: [id number] DOB:[birth year], 76 y.o., female Today's Date: 07/20/2022   PT End of Session - 07/23/22 1556     Visit Number 1    Date for PT Re-Evaluation 10/12/22    Authorization Type [employer]    PT Start Time 1620    PT Stop Time 1658    PT Time Calculation (min) 38 min    Activity Tolerance Patient tolerated treatment well;Patient limited by pain    Behavior During Therapy [facility] for tasks assessed/performed             Past Medical History:  Diagnosis Date   Arthritis    Breast cancer, left [facility]) oncologist-- dr [name]   dx 09-26-2017--- IDC, Stage IA, Grade 2, ER/PR +;  HER2 negative;  s/p  breast lumpectomy w/ node dissection 11-02-2017;  completed radiation 01-09-2018 (left breast genetic screening panel 2017 with variant of unknown significance AXINA)   COPD (chronic obstructive pulmonary disease) ([place])    pulmologist-- dr Dillard Essex-- w/ chronic lingular scarring  (last exceratbation bronchiectasis 07/ 2020)   Dyslipidemia    followed by cardiology/ vascular-- dr g. adams ([facility] in [place])  family history strokes   Eczema    Family history of breast cancer    Family history of colon cancer    GERD (gastroesophageal reflux disease)    Hiatal hernia    History of esophageal stricture    s/p dilatation 2008   History of external beam radiation therapy    left breast 12-19-2017  to 01-09-2018   History of hyperthyroidism    endocrinologist-- (lov 04-11-2019 epic) dr Loanne Drilling, dx 2009 due to multinodular goiter ,  had taken medication for few months 2010 stopped due to norma TFT;     Insomnia    Multinodular goiter last thyroid ultrasound in epic 12-18-2018   endocrinologist-- dr Loanne Drilling,  dx 2009 w/ hyperthroidism with medication until normal TFT in 2010;     Osteopenia 02/2018   T score -2.4 distal third of radius.  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   Wears contact lenses    Past Surgical History:  Procedure Laterality Date   BREAST LUMPECTOMY WITH RADIOACTIVE SEED AND SENTINEL LYMPH NODE BIOPSY Left 11/02/2017   Procedure: LEFT BREAST LUMPECTOMY WITH RADIOACTIVE SEED AND LEFT AXILLARY DEEP SENTINEL LYMPH NODE BIOPSY, INJECT BLUE DYE LEFT BREAST;  Surgeon: [name], MD;  Location: [place];  Service: General;  Laterality: Left;   BREAST SURGERY  2000   Breast cyst removed, left   LAPAROSCOPIC BILATERAL SALPINGO OOPHERECTOMY Bilateral 10/13/2019   Procedure: LAPAROSCOPIC BILATERAL SALPINGO OOPHORECTOMY LYSIS OF ADHESIONS PERITONEAL WASHINGS ;  Surgeon: [name], MD;  Location: [place];  Service: Gynecology;  Laterality: Bilateral;  request 7:30am OR time in [place] block requests one hour   TONSILLECTOMY  age 64   TUBAL LIGATION Bilateral yrs ago   VAGINAL HYSTERECTOMY  1991   Right ovarian cystectomy   VIDEO BRONCHOSCOPY Bilateral 03/16/2015   Procedure: VIDEO BRONCHOSCOPY WITHOUT FLUORO;  Surgeon: Brand Males, MD;  Location: [facility] ENDOSCOPY;  Service: Endoscopy;  Laterality: Bilateral;   Patient Active Problem List   Diagnosis Date Noted   Irritation of left eye 02/07/2022   Tinnitus 06/27/2019   Anxiety 03/29/2019   Insomnia 03/29/2019   Malignant neoplasm of lower-inner quadrant of left breast in female, estrogen receptor positive ([place]) 10/02/2017   Genetic testing 03/03/2016   Family history of breast cancer    Annual physical exam 07/27/2015   PCP NOTES >>>>>>> 07/27/2015   Smoking history 01/15/2015   Family hx of colon cancer  09/10/2014   Change in bowel habits 09/10/2014   Bronchiectasis ([place]) 07/28/2012   Osteopenia    Hypercholesteremia 10/03/2010   GOITER, MULTINODULAR 11/27/2008   UNSPECIFIED ANEMIA 11/02/2008   Thyrotoxicosis 10/22/2008   COLONIC POLYPS, BENIGN 01/16/2008   DEGENERATIVE JOINT DISEASE 01/16/2008   ESOPHAGEAL STRICTURE 04/05/2007   HIATAL HERNIA 08/15/2006   GERD 07/30/2006    PCP: Colon Branch, MD   REFERRING PROVIDER: [name], MD  REFERRING DIAG:  N39.3 (ICD-10-CM) - SUI (stress urinary incontinence, female)  N81.10 (ICD-10-CM) - Cystocele, unspecified    THERAPY DIAG:  Muscle weakness (generalized)  Unspecified lack of coordination  Abnormal posture  Rationale for Evaluation and Treatment Rehabilitation  ONSET DATE: 5-6 weeks ago is when I first noticed the prolapse  SUBJECTIVE:                                                                                                                                                                                           SUBJECTIVE STATEMENT: When I have been standing for a while is when I feel "something coming out of me" Fluid intake: Yes: lot of fluid, water, tea Herbal     PAIN:  Are you having pain? No  PRECAUTIONS: None  WEIGHT BEARING RESTRICTIONS No  FALLS:  Has patient fallen in last 6 months? 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)  PLOF: Independent  PATIENT GOALS be able to strengthen so it doesn't pop  PERTINENT HISTORY:  Hystorectomy at age 17 Sexual abuse: No  BOWEL MOVEMENT Pain with bowel movement: No Type of bowel movement:Type (Bristol Stool Scale) normal, Frequency normal to loose, and Strain No Fully empty rectum: Yes:   Leakage: No Pads: No Fiber supplement: No  URINATION Pain with urination: No Fully empty bladder: No and  frequent UTIs Stream:  weaker than it was Urgency: Yes: cannot hold it long Frequency: goes when convenient but can wait 2 hours  in the afternoon, I control with not drinking Leakage: Walking to the bathroom and when holding it a long time Pads: Yes: because I have to call someone to proctor  INTERCOURSE   PREGNANCY Vaginal deliveries 2 (plus one miscarriage Tearing Yes: episiotomy PROLAPSE Cystocele      OBJECTIVE:   DIAGNOSTIC FINDINGS:    PATIENT SURVEYS:    PFIQ-7 = 57  COGNITION:  Overall cognitive status: Within functional limits for tasks assessed     SENSATION:    MUSCLE LENGTH: Hamstrings: Right 60 deg; Left 70 deg Thomas test:   LUMBAR SPECIAL TESTS:  Straight leg raise test: Negative  FUNCTIONAL TESTS:    GAIT:  Comments: WFL                POSTURE: weight shift left, scoliosis, thoracic kyphosis and rounded shoulders   PELVIC ALIGNMENT:  LUMBARAROM/PROM  A/PROM A/PROM  eval  Flexion 60% +pain as she returns to standing and +pain after  Extension   Right lateral flexion   Left lateral flexion   Right rotation   Left rotation    (Blank rows = not tested)  LOWER EXTREMITY ROM:  Passive ROM Right eval Left eval  Hip flexion 75% +pain 75%+ pain  Hip extension    Hip abduction    Hip adduction    Hip internal rotation 50% +pain 50%+pain  Hip external rotation    Knee flexion    Knee extension    Ankle dorsiflexion    Ankle plantarflexion    Ankle inversion    Ankle eversion     (Blank rows = not tested)  LOWER EXTREMITY MMT:  MMT Right eval Left eval  Hip flexion    Hip extension    Hip abduction    Hip adduction    Hip internal rotation    Hip external rotation    Knee flexion    Knee extension    Ankle dorsiflexion    Ankle plantarflexion    Ankle inversion    Ankle eversion      PALPATION:   General  thoracic curve to the Rt and tight lumbar and thoracic paraspinals                External Perineal Exam  redness around urethra and descended anterior wall                             Internal Pelvic Floor Lt levators minimal attachment to the pubic ramus; high tone of levators and slow to relax/contract  Patient confirms identification and approves PT to assess internal pelvic floor and treatment Yes No emotional/communication barriers or cognitive limitation. Patient is motivated to learn. Patient understands and agrees with treatment goals and plan. PT explains patient will be examined in standing, sitting, and lying down to see how their muscles and joints work. When they are ready, they will be asked to remove their underwear so PT can examine their perineum. The patient is also given the option of providing their own chaperone as one is not provided in our facility. The patient also has the right and is explained the right to defer or refuse any part of the evaluation or treatment including the internal exam. With the patient's consent, PT will use one gloved finger to gently assess the muscles of the pelvic floor, seeing how well it contracts and relaxes and if there is muscle symmetry. After, the patient will get dressed and  PT and patient will discuss exam findings and plan of care. PT and patient discuss plan of care, schedule, attendance policy and HEP activities.  PELVIC MMT:   MMT eval  Vaginal 2/5 x 10 slow reps; 1 rep at 3 sec hold  Internal Anal Sphincter   External Anal Sphincter   Puborectalis   Diastasis Recti   (Blank rows = not tested)        TONE: high  PROLAPSE: Anterior wall urethra and bladder  TODAY'S TREATMENT   EVAL    HOME EXERCISE PROGRAM:   ASSESSMENT:  CLINICAL IMPRESSION: Patient is a 76 y.o. female who was seen today for physical therapy evaluation and treatment for cystocele and SUI. Pt demonstrates both urethra and bladder prolapse with valsalva.  Does not come beyond the hymen remnants but it is worse in standing per patient report.  Pt has  scoliosis and increased pain with forward flexion and lying supine. Tight hamstrings bil and She has weakness of the pelvic floor.  Can contract 10x slowly with 2/5 MMT, possible levator on Lt side.  She has endurance of 3 sec hold for pelvic floor.  Pain in knees due to lateral tracking patella and this limits knee extension strength.  Pt has tight hips bil flexion and IR with pain in back in both motions.  Pt will benefit from skilled PT to address all impairments that are functional and work on strength and coordination of the pelvic floor for improved function without increased prolapse as well as reduced urinary incontinence.   OBJECTIVE IMPAIRMENTS decreased coordination, decreased endurance, decreased ROM, decreased strength, increased fascial restrictions, increased muscle spasms, impaired flexibility, impaired tone, postural dysfunction, and pain.   ACTIVITY LIMITATIONS sleeping and continence  PARTICIPATION LIMITATIONS: community activity  PERSONAL FACTORS 1-2 comorbidities: scoliosis, chronic back and knee pain; history of breast cancer and 3+ comorbidities:    are also affecting patient's functional outcome.   REHAB POTENTIAL: Excellent  CLINICAL DECISION MAKING: Evolving/moderate complexity  EVALUATION COMPLEXITY: Moderate   GOALS: Goals reviewed with patient? Yes  SHORT TERM GOALS: Target date: 08/16/2022  Ind with kegel and initial HEP Baseline: Goal status: INITIAL  2.  Pt will report feeling small amount of change in prolapse and leakage after consistently doing intial HEP Baseline:  Goal status: INITIAL    LONG TERM GOALS: Target date: 10/12/2022   Pt will report 25% reduction of pain/discomfort from prolapse due to improvements in posture, strength, and muscle length  Baseline:  Goal status: INITIAL  2.  Pt will be able to functional actions such as walking to the bathroom after holding for 2 hours without leakage  Baseline:  Goal status: INITIAL  3.  Pt  will be able to functional actions such as sit for 2 hours without having severe urge to void upon standing  Baseline:  Goal status: INITIAL  4.  Pt will be able to sustain pelvic floor contraction for at least 10 sec hold for improved bladder control when walking Baseline:  Goal status: INITIAL  5.  Pt will be ind with urge techniques for reduced severity of urgency when walking to the bathroom  Baseline:  Goal status: INITIAL    PLAN: PT FREQUENCY: 1x/week  PT DURATION: 12 weeks  PLANNED INTERVENTIONS: Therapeutic exercises, Therapeutic activity, Neuromuscular re-education, Balance training, Gait training, Patient/Family education, Self Care, Joint mobilization, Dry Needling, Electrical stimulation, Cryotherapy, Moist heat, Taping, Biofeedback, Manual therapy, and Re-evaluation  PLAN FOR NEXT SESSION: spine mobility, stretching the posterior pelvic floor,  prone kegel and isolated kegel - initial HEP   [employer], PT 07/23/2022, 4:00 PM

## 2022-07-24 NOTE — Addendum Note (Signed)
Addended byDamita Dunnings D on: 07/24/2022 08:49 AM   Modules accepted: Orders

## 2022-07-25 ENCOUNTER — Other Ambulatory Visit (INDEPENDENT_AMBULATORY_CARE_PROVIDER_SITE_OTHER): Payer: Medicare PPO

## 2022-07-25 DIAGNOSIS — E871 Hypo-osmolality and hyponatremia: Secondary | ICD-10-CM | POA: Diagnosis not present

## 2022-07-25 DIAGNOSIS — E222 Syndrome of inappropriate secretion of antidiuretic hormone: Secondary | ICD-10-CM

## 2022-07-26 LAB — BASIC METABOLIC PANEL
BUN: 21 mg/dL (ref 6–23)
CO2: 24 mEq/L (ref 19–32)
Calcium: 9.2 mg/dL (ref 8.4–10.5)
Chloride: 95 mEq/L — ABNORMAL LOW (ref 96–112)
Creatinine, Ser: 1.02 mg/dL (ref 0.40–1.20)
GFR: 54.26 mL/min — ABNORMAL LOW (ref >60.00)
Glucose, Bld: 87 mg/dL (ref 70–99)
Potassium: 4.7 mEq/L (ref 3.5–5.1)
Sodium: 129 mEq/L — ABNORMAL LOW (ref 135–145)

## 2022-07-26 LAB — SODIUM, URINE, RANDOM: Sodium, Ur: 23 mmol/L — ABNORMAL LOW (ref 28–272)

## 2022-07-26 LAB — OSMOLALITY: Osmolality: 271 mOsm/kg — ABNORMAL LOW (ref 278–305)

## 2022-07-27 ENCOUNTER — Encounter: Payer: Self-pay | Admitting: Obstetrics & Gynecology

## 2022-07-27 ENCOUNTER — Ambulatory Visit: Payer: Medicare PPO | Admitting: Obstetrics & Gynecology

## 2022-07-27 VITALS — BP 118/70

## 2022-07-27 DIAGNOSIS — M8589 Other specified disorders of bone density and structure, multiple sites: Secondary | ICD-10-CM | POA: Diagnosis not present

## 2022-07-27 DIAGNOSIS — Z9189 Other specified personal risk factors, not elsewhere classified: Secondary | ICD-10-CM

## 2022-07-27 DIAGNOSIS — M816 Localized osteoporosis [Lequesne]: Secondary | ICD-10-CM | POA: Diagnosis not present

## 2022-07-27 NOTE — Progress Notes (Signed)
    Cynthia Thornton 09-Feb-1946 [id number]        76 y.o.  [id number]   RP: Counseling and management of Osteopenia with increased FRAX at the Hip  HPI: No h/o fragility fracture.  Good balance.  Limited in her activities by lower back pain/sciatica and knee pains.  Drinking almond milk.  No recent Vit D level.  Bone Density 05/2022 showing an increased FRAX at the Hip and Osteoporosis at the Lt total Radius.   OB History  Gravida Para Term Preterm AB Living  '3 2 2   1 2  '$ SAB IAB Ectopic Multiple Live Births  1            # Outcome Date GA Lbr Len/2nd Weight Sex Delivery Anes PTL Lv  3 SAB           2 Term           1 Term             Past medical history,surgical history, problem list, medications, allergies, family history and social history were all reviewed and documented in the EPIC chart.   Directed ROS with pertinent positives and negatives documented in the history of present illness/assessment and plan.  Exam:  Vitals:   07/27/22 1607  BP: 118/70   General appearance:  Normal  Bone Density 06/13/2022:  Osteopenia at the bilateral Total Hips with T-Score at -2.0.  Lowest T-Score at the Rt Femoral Neck.  FRAX increased to 3.9 at the Hip.  AP Spine normal.  Osteoporosis at the Lt Total Forearm T-Score -3.2.   Assessment/Plan:  76 y.o. [id number]   1. Osteopenia of multiple sites No h/o fragility fracture.  Good balance.  Limited in her activities by lower back pain/sciatica and knee pains.  Drinking almond milk.  No recent Vit D level.  Bone Density 05/2022 showing an increased FRAX at the Hip and Osteoporosis at the Lt total Radius.  Bone density findings thoroughly reviewed with patient.  Counseling done on Bone Medications. Declines Bone medication at this time.  Will optimize her Ca++ intake to a total of 1.5 g/d, Vit D level check today and increase weight bearing activities daily.  Will also do upper body light weight lifting.  Repeat Bone Density in 2 years. - Vitamin D (25  hydroxy)   2. Fracture Risk Assessment Score (FRAX) indicating greater than 3% risk for hip fracture  As above.  3. Localized osteoporosis of forearm without pathological fracture  As above.  [name] MD, 4:26 PM 07/27/2022

## 2022-07-28 ENCOUNTER — Encounter: Payer: Self-pay | Admitting: Obstetrics & Gynecology

## 2022-07-28 LAB — VITAMIN D 25 HYDROXY (VIT D DEFICIENCY, FRACTURES): Vit D, 25-Hydroxy: 33 ng/mL (ref 30–100)

## 2022-07-28 NOTE — Addendum Note (Signed)
Addended byDamita Dunnings D on: 07/28/2022 01:05 PM   Modules accepted: Orders

## 2022-07-31 DIAGNOSIS — M5412 Radiculopathy, cervical region: Secondary | ICD-10-CM | POA: Diagnosis not present

## 2022-08-09 DIAGNOSIS — M5412 Radiculopathy, cervical region: Secondary | ICD-10-CM | POA: Diagnosis not present

## 2022-08-11 ENCOUNTER — Encounter: Payer: Self-pay | Admitting: Internal Medicine

## 2022-08-22 DIAGNOSIS — M5412 Radiculopathy, cervical region: Secondary | ICD-10-CM | POA: Diagnosis not present

## 2022-08-23 ENCOUNTER — Ambulatory Visit: Payer: Medicare PPO | Attending: Obstetrics & Gynecology | Admitting: Physical Therapy

## 2022-08-23 ENCOUNTER — Encounter: Payer: Self-pay | Admitting: Physical Therapy

## 2022-08-23 DIAGNOSIS — M6281 Muscle weakness (generalized): Secondary | ICD-10-CM | POA: Insufficient documentation

## 2022-08-23 DIAGNOSIS — R293 Abnormal posture: Secondary | ICD-10-CM | POA: Insufficient documentation

## 2022-08-23 DIAGNOSIS — R279 Unspecified lack of coordination: Secondary | ICD-10-CM | POA: Insufficient documentation

## 2022-08-23 NOTE — Therapy (Signed)
OUTPATIENT PHYSICAL THERAPY FEMALE PELVIC TREATMENT   Patient Name: Cynthia Thornton MRN: [id number] DOB:[birth year], 76 y.o., female Today's Date: 07/20/2022   PT End of Session - 08/23/22 1527     Visit Number 2    Date for PT Re-Evaluation 10/12/22    Authorization Type [employer] Approved 10 visits, 07/20/2022-10/12/2022    PT Start Time 3300    PT Stop Time 1605    PT Time Calculation (min) 40 min    Activity Tolerance Patient tolerated treatment well;Patient limited by pain    Behavior During Therapy [facility] for tasks assessed/performed              Past Medical History:  Diagnosis Date   Arthritis    Breast cancer, left [facility]) oncologist-- dr [name]   dx 09-26-2017--- IDC, Stage IA, Grade 2, ER/PR +;  HER2 negative;  s/p  breast lumpectomy w/ node dissection 11-02-2017;  completed radiation 01-09-2018 (left breast genetic screening panel 2017 with variant of unknown significance AXINA)   COPD (chronic obstructive pulmonary disease) ([place])    pulmologist-- dr Dillard Essex-- w/ chronic lingular scarring  (last exceratbation bronchiectasis 07/ 2020)   Dyslipidemia    followed by cardiology/ vascular-- dr g. adams ([facility] in [place])  family history strokes   Eczema    Family history of breast cancer    Family history of colon cancer    GERD (gastroesophageal reflux disease)    Hiatal hernia    History of esophageal stricture    s/p dilatation 2008   History of external beam radiation therapy    left breast 12-19-2017  to 01-09-2018   History of hyperthyroidism    endocrinologist-- (lov 04-11-2019 epic) dr Loanne Drilling, dx 2009 due to multinodular goiter ,  had taken medication for few months 2010 stopped due to norma TFT;     Insomnia    Multinodular goiter last thyroid ultrasound in epic 12-18-2018   endocrinologist-- dr Loanne Drilling,  dx 2009 w/ hyperthroidism with medication until normal TFT in 2010;     Osteopenia 02/2018   T score -2.4 distal third of radius.   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   Wears contact lenses    Past Surgical History:  Procedure Laterality Date   BREAST LUMPECTOMY WITH RADIOACTIVE SEED AND SENTINEL LYMPH NODE BIOPSY Left 11/02/2017   Procedure: LEFT BREAST LUMPECTOMY WITH RADIOACTIVE SEED AND LEFT AXILLARY DEEP SENTINEL LYMPH NODE BIOPSY, INJECT BLUE DYE LEFT BREAST;  Surgeon: [name], MD;  Location: [place];  Service: General;  Laterality: Left;   BREAST SURGERY  2000   Breast cyst removed, left   LAPAROSCOPIC BILATERAL SALPINGO OOPHERECTOMY Bilateral 10/13/2019   Procedure: LAPAROSCOPIC BILATERAL SALPINGO OOPHORECTOMY LYSIS OF ADHESIONS PERITONEAL WASHINGS ;  Surgeon: [name], MD;  Location: [place];  Service: Gynecology;  Laterality: Bilateral;  request 7:30am OR time in [place] block requests one hour   TONSILLECTOMY  age 65   TUBAL LIGATION Bilateral yrs ago   VAGINAL HYSTERECTOMY  1991   Right ovarian cystectomy   VIDEO BRONCHOSCOPY Bilateral 03/16/2015  Procedure: VIDEO BRONCHOSCOPY WITHOUT FLUORO;  Surgeon: Brand Males, MD;  Location: [facility] ENDOSCOPY;  Service: Endoscopy;  Laterality: Bilateral;   Patient Active Problem List   Diagnosis Date Noted   Irritation of left eye 02/07/2022   Tinnitus 06/27/2019   Anxiety 03/29/2019   Insomnia 03/29/2019   Malignant neoplasm of lower-inner quadrant of left breast in female, estrogen receptor positive ([place]) 10/02/2017   Genetic testing 03/03/2016   Family history of breast cancer    Annual physical exam 07/27/2015   PCP NOTES >>>>>>> 07/27/2015   Smoking  history 01/15/2015   Family hx of colon cancer 09/10/2014   Change in bowel habits 09/10/2014   Bronchiectasis ([place]) 07/28/2012   Osteopenia    Hypercholesteremia 10/03/2010   GOITER, MULTINODULAR 11/27/2008   UNSPECIFIED ANEMIA 11/02/2008   Thyrotoxicosis 10/22/2008   COLONIC POLYPS, BENIGN 01/16/2008   DEGENERATIVE JOINT DISEASE 01/16/2008   ESOPHAGEAL STRICTURE 04/05/2007   HIATAL HERNIA 08/15/2006   GERD 07/30/2006    PCP: Colon Branch, MD   REFERRING PROVIDER: [name], MD  REFERRING DIAG:  N39.3 (ICD-10-CM) - SUI (stress urinary incontinence, female)  N81.10 (ICD-10-CM) - Cystocele, unspecified    THERAPY DIAG:  Muscle weakness (generalized)  Unspecified lack of coordination  Abnormal posture  Rationale for Evaluation and Treatment Rehabilitation  ONSET DATE: 5-6 weeks ago is when I first noticed the prolapse  SUBJECTIVE:                                                                                                                                                                                           SUBJECTIVE STATEMENT: I had assessments today and am feeling tired.  Pt states every thing is about the same. Fluid intake: Yes: lot of fluid, water, tea Herbal     PAIN:  Are you having pain? No  PRECAUTIONS: None  WEIGHT BEARING RESTRICTIONS No  FALLS:  Has patient fallen in last 6 months? 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)  PLOF: Independent  PATIENT GOALS be able to strengthen so it doesn't pop  PERTINENT HISTORY:  Hystorectomy at age 87 Sexual abuse: No  BOWEL MOVEMENT Pain with bowel movement: No Type of bowel movement:Type (Bristol Stool Scale) normal, Frequency normal to loose, and Strain No Fully empty rectum: Yes:   Leakage: No Pads: No Fiber supplement:  No  URINATION Pain with urination: No Fully empty bladder: No and frequent UTIs Stream:  weaker than it was Urgency: Yes: cannot hold it long Frequency: goes when  convenient but can wait 2 hours in the afternoon, I control with not drinking Leakage: Walking to the bathroom and when holding it a long time Pads: Yes: because I have to call someone to proctor  INTERCOURSE   PREGNANCY Vaginal deliveries 2 (plus one miscarriage Tearing Yes: episiotomy PROLAPSE Cystocele      OBJECTIVE:   DIAGNOSTIC FINDINGS:    PATIENT SURVEYS:    PFIQ-7 = 57  COGNITION:  Overall cognitive status: Within functional limits for tasks assessed     SENSATION:    MUSCLE LENGTH: Hamstrings: Right 60 deg; Left 70 deg Thomas test:   LUMBAR SPECIAL TESTS:  Straight leg raise test: Negative  FUNCTIONAL TESTS:    GAIT:  Comments: WFL                POSTURE: weight shift left, scoliosis, thoracic kyphosis and rounded shoulders   PELVIC ALIGNMENT:  LUMBARAROM/PROM  A/PROM A/PROM  eval  Flexion 60% +pain as she returns to standing and +pain after  Extension   Right lateral flexion   Left lateral flexion   Right rotation   Left rotation    (Blank rows = not tested)  LOWER EXTREMITY ROM:  Passive ROM Right eval Left eval  Hip flexion 75% +pain 75%+ pain  Hip extension    Hip abduction    Hip adduction    Hip internal rotation 50% +pain 50%+pain  Hip external rotation    Knee flexion    Knee extension    Ankle dorsiflexion    Ankle plantarflexion    Ankle inversion    Ankle eversion     (Blank rows = not tested)  LOWER EXTREMITY MMT:  MMT Right eval Left eval  Hip flexion    Hip extension    Hip abduction    Hip adduction    Hip internal rotation    Hip external rotation    Knee flexion    Knee extension    Ankle dorsiflexion    Ankle plantarflexion    Ankle inversion    Ankle eversion      PALPATION:   General  thoracic curve to the Rt and tight  lumbar and thoracic paraspinals                External Perineal Exam redness around urethra and descended anterior wall                             Internal Pelvic Floor Lt levators minimal attachment to the pubic ramus; high tone of levators and slow to relax/contract  Patient confirms identification and approves PT to assess internal pelvic floor and treatment Yes No emotional/communication barriers or cognitive limitation. Patient is motivated to learn. Patient understands and agrees with treatment goals and plan. PT explains patient will be examined in standing, sitting, and lying down to see how their muscles and joints work. When they are ready, they will be asked to remove their underwear so PT can examine their perineum. The patient is also given the option of providing their own chaperone as one is not provided in our facility. The patient also has the right and is explained the right to defer or refuse any part of the evaluation or treatment including the internal exam. With the patient's consent, PT will use one gloved finger to gently assess the muscles of the pelvic floor, seeing how well it contracts and relaxes and if there is muscle symmetry. After,  the patient will get dressed and PT and patient will discuss exam findings and plan of care. PT and patient discuss plan of care, schedule, attendance policy and HEP activities.  PELVIC MMT:   MMT eval  Vaginal 2/5 x 10 slow reps; 1 rep at 3 sec hold  Internal Anal Sphincter   External Anal Sphincter   Puborectalis   Diastasis Recti   (Blank rows = not tested)        TONE: high  PROLAPSE: Anterior wall urethra and bladder  TODAY'S TREATMENT   Date: 10/25 Self care:   [place] Re-ed: Education and cues for coordination of breathing  Pelvic floor muscle contracting and relaxing at appropriate times during activities  Exercises:     HOME EXERCISE PROGRAM:   ASSESSMENT:  CLINICAL IMPRESSION: Patient did well with  stretches today and is    OBJECTIVE IMPAIRMENTS decreased coordination, decreased endurance, decreased ROM, decreased strength, increased fascial restrictions, increased muscle spasms, impaired flexibility, impaired tone, postural dysfunction, and pain.   ACTIVITY LIMITATIONS sleeping and continence  PARTICIPATION LIMITATIONS: community activity  PERSONAL FACTORS 1-2 comorbidities: scoliosis, chronic back and knee pain; history of breast cancer and 3+ comorbidities:    are also affecting patient's functional outcome.   REHAB POTENTIAL: Excellent  CLINICAL DECISION MAKING: Evolving/moderate complexity  EVALUATION COMPLEXITY: Moderate   GOALS: Goals reviewed with patient? Yes  SHORT TERM GOALS: Target date: 08/16/2022  Ind with kegel and initial HEP Baseline: Goal status: IN PROGRESS  2.  Pt will report feeling small amount of change in prolapse and leakage after consistently doing intial HEP Baseline:  Goal status: IN PROGRESS    LONG TERM GOALS: Target date: 10/12/2022   Pt will report 25% reduction of pain/discomfort from prolapse due to improvements in posture, strength, and muscle length  Baseline:  Goal status: INITIAL  2.  Pt will be able to functional actions such as walking to the bathroom after holding for 2 hours without leakage  Baseline:  Goal status: INITIAL  3.  Pt will be able to functional actions such as sit for 2 hours without having severe urge to void upon standing  Baseline:  Goal status: INITIAL  4.  Pt will be able to sustain pelvic floor contraction for at least 10 sec hold for improved bladder control when walking Baseline:  Goal status: INITIAL  5.  Pt will be ind with urge techniques for reduced severity of urgency when walking to the bathroom  Baseline:  Goal status: INITIAL    PLAN: PT FREQUENCY: 1x/week  PT DURATION: 12 weeks  PLANNED INTERVENTIONS: Therapeutic exercises, Therapeutic activity, Neuromuscular re-education,  Balance training, Gait training, Patient/Family education, Self Care, Joint mobilization, Dry Needling, Electrical stimulation, Cryotherapy, Moist heat, Taping, Biofeedback, Manual therapy, and Re-evaluation  PLAN FOR NEXT SESSION: f/u on initial HEP, progress kegel exericses; breathing and stretching throughout   [employer], PT 08/23/2022, 3:28 PM

## 2022-08-28 ENCOUNTER — Ambulatory Visit: Payer: Medicare PPO | Admitting: Internal Medicine

## 2022-08-28 ENCOUNTER — Encounter: Payer: Self-pay | Admitting: Internal Medicine

## 2022-08-28 VITALS — BP 120/68 | HR 68 | Temp 98.3°F | Ht 63.5 in | Wt 147.6 lb

## 2022-08-28 DIAGNOSIS — J479 Bronchiectasis, uncomplicated: Secondary | ICD-10-CM

## 2022-08-28 DIAGNOSIS — Z7185 Encounter for immunization safety counseling: Secondary | ICD-10-CM | POA: Diagnosis not present

## 2022-08-28 NOTE — Progress Notes (Signed)
OV 10/04/2018  Subjective:  Patient ID: Cynthia Thornton, female , DOB: Mar 04, 1946 , age 76 y.o. , MRN: [id number] , ADDRESS: [place] [address]   10/04/2018 -   Chief Complaint  Patient presents with   Follow-up    f/u bronchiectasis, pt reports she is doing well , flutter valve has helped her get mucus up, some cough but has gotten better, mucus is thinner     HPI Cynthia Thornton 76 y.o. -presents for follow-up of her mild bronchiectasis with mild sputum production.  Last visit September 2019 we introduce flutter valve after discussing several options because sputum volume had increased.  At this point in time she says flutter valve works [employer].  She occasionally uses Mucinex.  She tries to avoid medicines.  She is up-to-date with her pulmonary vaccines.  She would benefit from a new Shingrix shingles vaccine which she will talk to her primary care about.     ROS - per HPI   OV 08/12/2020  Subjective:  Patient ID: Cynthia Thornton, female , DOB: 07/03/1946 , age 76 y.o. , MRN: [id number] , ADDRESS: [place] [id number] PCP Colon Branch, MD Patient Care Team: Colon Branch, MD as PCP - Gaston Islam, MD as Consulting Physician (Orthopedic Surgery) Nicholas Lose, MD as Consulting Physician (Hematology and Oncology) [name], MD as Consulting Physician (General Surgery) Fontaine, Belinda Block, MD (Inactive) as Consulting Physician (Gynecology) Brand Males, MD as Consulting Physician (Pulmonary Disease) Kyung Rudd, MD as Consulting Physician (Radiation Oncology) Delice Bison Charlestine Massed, NP as Nurse Practitioner (Hematology and Oncology) Ardis Hughs, MD as Attending Physician (Urology) Melida Quitter, MD as Consulting Physician (Otolaryngology)  This Provider for this visit: Treatment Team:  Attending Provider: Brand Males, MD    08/12/2020 -   Chief Complaint  Patient presents with   Follow-up     Bronchiectasis, doing well     HPI Cynthia Thornton 76 y.o. -follow-up for mild bronchiectasis.  Last seen in December 2019.  Then after the onset of the pandemic and never saw her.  She has been isolating.  She is fully vaccinated.  In July 2020 after exposure to work she had a bronchiectasis exacerbation.  She says since then she has been doing well.  She says that after seeing the nurse practitioner and starting Mucinex as needed and flutter valve she has been doing well.  There is no acute issues.     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 No evidence of acute cardiopulmonary disease. Electronically Signed   By: Julian Hy M.D.   On: 05/25/2016 17:16   ROS - per HPI    OV 08/22/2021  Subjective:  Patient ID: Cynthia Thornton, female , DOB: Jan 24, 1946 , age 76 y.o. , MRN: [id number] , ADDRESS: [place] [id number] PCP Colon Branch, MD Patient Care Team: Colon Branch, MD as PCP - Gaston Islam, MD as Consulting Physician (Orthopedic Surgery) Nicholas Lose, MD as Consulting Physician (Hematology and Oncology) [name], MD as Consulting Physician (General Surgery) Fontaine, Belinda Block, MD (Inactive) as Consulting Physician (Gynecology) Brand Males, MD as Consulting Physician (Pulmonary Disease) Kyung Rudd, MD as Consulting Physician (Radiation Oncology) Delice Bison, Charlestine Massed, NP as Nurse Practitioner (Hematology and Oncology) Ardis Hughs, MD as Attending Physician (Urology) Melida Quitter,  MD as Consulting Physician (Otolaryngology)  This Provider for this visit: Treatment Team:  Attending Provider: Brand Males, MD    08/22/2021 -   Chief Complaint  Patient presents with   Follow-up    Pt states she has been doing well since last visit. States that she has not been coughing  up any blood.     HPI Cynthia Thornton 76 y.o. -follow-up mild bronchiectasis.  Last CT scan of the chest 2017.  Last pulmonary function test 2014.  Is a 1 year follow-up.  She continues to do well.  No respiratory symptoms other than a couple of episodes of mild hemoptysis in the last 1 year.  Up-to-date with flu shot.  In July 2022 she had COVID booster but now that is the by [place] COVID booster and she wants to know if she can or should have it.  She has never had COVID.  Overall doing well without any complaints.     OV 08/28/2022  Subjective:  Patient ID: Cynthia Thornton, female , DOB: [birth year] , age 76 y.o. , MRN: [id number] , ADDRESS: [place] [id number] PCP Colon Branch, MD Patient Care Team: Colon Branch, MD as PCP - Gaston Islam, MD as Consulting Physician (Orthopedic Surgery) Nicholas Lose, MD as Consulting Physician (Hematology and Oncology) [name], MD as Consulting Physician (General Surgery) Brand Males, MD as Consulting Physician (Pulmonary Disease) Kyung Rudd, MD as Consulting Physician (Radiation Oncology) Delice Bison, Charlestine Massed, NP as Nurse Practitioner (Hematology and Oncology) Ardis Hughs, MD as Attending Physician (Urology) Melida Quitter, MD as Consulting Physician (Otolaryngology) Patrici Ranks. (Inactive) (Cardiology) [name], MD as Consulting Physician (Obstetrics and Gynecology)  This Provider for this visit: Treatment Team:  Attending Provider: Brand Males, MD    08/28/2022 -   Chief Complaint  Patient presents with   Follow-up    Pt states she has been doing okay since last visit. States she needs a new flutter valve.     HPI Cynthia Thornton 76 y.o. -returns for follow-up.  Its been a year since I last saw her.  No new health issues.  No ER visits no hospitalizations no new symptoms no changes in medications.  She continues to be stable no further hemoptysis.  Last CT scan of the  chest was 1 year ago last pulmonary function test was in 2014.  She has questions about respiratory vaccines.  She has had a flu shot.  She wanted know about the RSV vaccine.  She is hesitant to do the COVID mRNA vaccine.  This is because of a lot of anecdotal experience about people she know who have had significant cardiotoxicity from COVID mRNA vaccine.  We discussed the fact that RSV vaccine is new but similar technology to flu shot.  Similar side effect profile.  Did discuss RSV virology and significance.  Overall benefit outweighs the risk.  She is going to have the RSV vaccine after he took a shared decision making on it.  She will have  at a commercial pharmacy.    CT Chest data -  Narrative & Impression  CLINICAL DATA:  Follow-up bronchiectasis   EXAM: CT CHEST WITHOUT CONTRAST   TECHNIQUE: Multidetector CT imaging of the chest was performed following the standard protocol without IV contrast.   COMPARISON:  05/25/2016   FINDINGS: Cardiovascular: No significant vascular findings. Normal heart size. Small pericardial effusion.   Mediastinum/Nodes: No enlarged mediastinal, hilar, or axillary lymph nodes. Thyroid  gland, trachea, and esophagus demonstrate no significant findings.   Lungs/Pleura: Mild, subpleural radiation fibrosis of the anterior left upper lobe (series 3, image 64). Unchanged mild bronchiectasis, fibrotic scarring and volume loss of the medial segment right middle lobe and lingula (series 3, image 102). There are new, scattered centrilobular and tree-in-bud nodules within the medial segment right middle lobe (series 3, image 102). No pleural effusion or pneumothorax.   Upper Abdomen: No acute abnormality. Multiple lobulated, fluid attenuation cysts or hemangiomata of the liver, unchanged.   Musculoskeletal: No chest wall mass or suspicious bone lesions identified. Surgical clips in the left breast. Dextroscoliosis of the thoracic spine. Benign vertebral  body hemangioma of T9 (series 6, image 70).   IMPRESSION: 1. Unchanged mild bronchiectasis, fibrotic scarring and volume loss of the medial segment right middle lobe and lingula. 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 2. There are new, scattered centrilobular and tree-in-bud nodules within the medial segment right middle lobe, again consistent with atypical mycobacterial infection. 3. Mild, subpleural radiation fibrosis of the anterior left upper lobe.     Electronically Signed   By: Delanna Ahmadi M.D.   On: 08/30/2021 16:08    No results found.    PFT     Latest Ref Rng & Units 10/20/2013    8:37 AM  PFT Results  FVC-Pre L 3.34   FVC-Predicted Pre % 106   FVC-Post L 3.49   FVC-Predicted Post % 111   Pre FEV1/FVC % % 67   Post FEV1/FCV % % 75   FEV1-Pre L 2.26   FEV1-Predicted Pre % 94   FEV1-Post L 2.62        has a past medical history of Arthritis, Breast cancer, left [facility]) (oncologist-- dr [name]), COPD (chronic obstructive pulmonary disease) ([place]), Dyslipidemia, Eczema, Family history of breast cancer, Family history of colon cancer, GERD (gastroesophageal reflux disease), Hiatal hernia, History of esophageal stricture, History of external beam radiation therapy, History of hyperthyroidism, Insomnia, Multinodular goiter (last thyroid ultrasound in epic 12-18-2018), Osteopenia (02/2018), Personal history of colonic polyps (09/06/2001), Pityriasis rosea, PONV (postoperative nausea and vomiting), Primary osteoarthritis of left knee, Rhomboid pain, and Wears contact lenses.   reports that she quit smoking about 26 years ago. Her smoking use included cigarettes. She has a 3.00 pack-year smoking history. She has never used smokeless tobacco.  Past Surgical History:  Procedure Laterality Date   BREAST LUMPECTOMY WITH RADIOACTIVE SEED AND SENTINEL LYMPH NODE BIOPSY Left 11/02/2017   Procedure: LEFT  BREAST LUMPECTOMY WITH RADIOACTIVE SEED AND LEFT AXILLARY DEEP SENTINEL LYMPH NODE BIOPSY, INJECT BLUE DYE LEFT BREAST;  Surgeon: [name], MD;  Location: [place];  Service: General;  Laterality: Left;   BREAST SURGERY  2000   Breast cyst removed, left   LAPAROSCOPIC BILATERAL SALPINGO OOPHERECTOMY Bilateral 10/13/2019   Procedure: LAPAROSCOPIC BILATERAL SALPINGO OOPHORECTOMY LYSIS OF ADHESIONS PERITONEAL WASHINGS ;  Surgeon: [name], MD;  Location: [place];  Service: Gynecology;  Laterality: Bilateral;  request 7:30am OR time in [place] block requests one hour   TONSILLECTOMY  age 60   TUBAL LIGATION Bilateral yrs ago   VAGINAL HYSTERECTOMY  1991   Right ovarian cystectomy   VIDEO BRONCHOSCOPY Bilateral 03/16/2015   Procedure: VIDEO BRONCHOSCOPY WITHOUT FLUORO;  Surgeon: Brand Males, MD;  Location[facility] ENDOSCOPY;  Service: Endoscopy;  Laterality: Bilateral;    Allergies  Allergen Reactions   Doxycycline Rash   Other Other (See  Comments)    Surgical glue : hives   Oxycodone Nausea And Vomiting   Sulfa Antibiotics Hives   Trimethoprim Sulfate [Trimethoprim]     rash   Codeine Nausea Only   Propranolol Hcl Other (See Comments)    "Dizziness and heart racing"    Immunization History  Administered Date(s) Administered   Influenza Split 08/31/2011, 07/30/2014   Influenza Whole 08/30/2009, 07/30/2010   Influenza, High Dose Seasonal PF 07/05/2018, 08/02/2019, 08/15/2021, 07/29/2022   Influenza,inj,Quad PF,6+ Mos 09/30/2012, 10/10/2013, 07/26/2015   Influenza-Unspecified 08/18/2016, 07/30/2020   Moderna Sars-Covid-2 Vaccination 11/29/2019, 12/29/2019, 10/14/2020, 05/19/2021   Pneumococcal Conjugate-13 10/10/2013   Pneumococcal Polysaccharide-23 07/27/2015   Td 12/02/2008   Tdap 01/03/2019   Zoster Recombinat (Shingrix) 01/03/2019, 04/18/2019   Zoster, Live 12/02/2008    Family History  Problem Relation Age of Onset    Osteoporosis Mother    Lung cancer Mother    Stroke Mother    Diabetes Father    Hypertension Father    Osteoporosis Father    Colon cancer Father 58   Breast cancer Sister        Age 46   Leukemia Maternal Grandfather    Crohn's disease Son    Ovarian cancer Maternal Aunt 60   Breast cancer Maternal Aunt        great aunt- Age unknown   Colon cancer Paternal Uncle 51   Breast cancer Cousin        maternal-Age 42   Colon cancer Cousin        maternal first cousin     Current Outpatient Medications:    B Complex-C (B-COMPLEX WITH VITAMIN C) tablet, Take 1 tablet by mouth daily., Disp: , Rfl:    cetirizine (ZYRTEC) 10 MG tablet, Take 10 mg by mouth daily., Disp: , Rfl:    Cholecalciferol (VITAMIN D) 2000 units CAPS, Take 2,000 Units by mouth daily., Disp: , Rfl:    clorazepate (TRANXENE) 7.5 MG tablet, Take 1 tablet (7.5 mg total) by mouth at bedtime as needed for anxiety., Disp: 30 tablet, Rfl: 2   CRANBERRY PO, Take 2 tablets by mouth in the morning and at bedtime., Disp: , Rfl:    diazepam (VALIUM) 2 MG tablet, Take 2 mg by mouth every 6 (six) hours as needed., Disp: , Rfl:    famotidine (PEPCID) 20 MG tablet, Take 1 tablet (20 mg total) by mouth daily., Disp: 90 tablet, Rfl: 3   methenamine (HIPREX) 1 g tablet, Take 1 g by mouth 2 (two) times daily with a meal. Take with '500mg'$  of vitamin c w/ each tablet, Disp: , Rfl:    methocarbamol (ROBAXIN) 500 MG tablet, Take 500 mg by mouth every 8 (eight) hours as needed., Disp: , Rfl:    omeprazole (PRILOSEC) 40 MG capsule, Take 1 capsule (40 mg total) by mouth daily., Disp: 90 capsule, Rfl: 3   ondansetron (ZOFRAN-ODT) 4 MG disintegrating tablet, Take 1 tablet (4 mg total) by mouth every 8 (eight) hours as needed for nausea or vomiting., Disp: 30 tablet, Rfl: 0   Probiotic Product (PROBIOTIC PO), Take by mouth., Disp: , Rfl:    valACYclovir (VALTREX) 500 MG tablet, Take 1 tablet (500 mg total) by mouth daily. 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   Weight as of this encounter: 147 lb 9.6 oz (67 kg).  '@WEIGHTCHANGE'$ @  Filed Weights   08/28/22 1526  Weight: 147 lb 9.6 oz (67 kg)     Physical Exam 2General: No distress. Looks well Neuro: Alert and Oriented x 3. GCS 15. Speech normal Psych: Pleasant Resp:  Barrel Chest - no.  Wheeze - no, Crackles - no, No overt respiratory distress CVS: Normal heart sounds. Murmurs - no Ext: Stigmata of Connective Tissue Disease - nop HEENT: Normal upper airway. PEERL +. No post nasal drip        Assessment:       ICD-10-CM   1. Bronchiectasis without complication ([place])  G25.4     2. Vaccine counseling  Z71.85          Plan:     Patient Instructions     ICD-10-CM   1. Bronchiectasis without complication ([place])  Y70.6     2. Vaccine counseling  Z71.85        Glad you are doing well without any interim health issues or coughing up blood Discussed respiratory vaaccines  - glad  you had flu shot  - shared decision making you will take RS vaccine   - respect hesitancy towards covid mRNA vaccine  Follow-up - 9 months do spirometry and dlco (last 2014) -9  months or sooner if needed    SIGNATURE    Dr. Brand Males, M.D., F.C.C.P,  Pulmonary and Critical Care Medicine Staff Physician, [place] Director - Interstitial Lung Disease  Program  Pulmonary [facility] [place], [place], [address]  Pager: [phone], If no answer or between  15:00h - 7:00h: call [phone] Telephone: [phone]  4:09 PM 08/28/2022

## 2022-08-28 NOTE — Addendum Note (Signed)
Addended by: Lorretta Harp on: 08/28/2022 04:15 PM   Modules accepted: Orders

## 2022-08-28 NOTE — Patient Instructions (Addendum)
ICD-10-CM   1. Bronchiectasis without complication ([place])  S08.1     2. Vaccine counseling  Z71.85        Glad you are doing well without any interim health issues or coughing up blood Discussed respiratory vaaccines  - glad  you had flu shot  - shared decision making you will take RS vaccine   - respect hesitancy towards covid mRNA vaccine  Follow-up - 9 months do spirometry and dlco (last 2014) -9  months or sooner if needed

## 2022-08-29 NOTE — Therapy (Unsigned)
OUTPATIENT PHYSICAL THERAPY FEMALE PELVIC TREATMENT   Patient Name: Cynthia Thornton MRN: [id number] DOB:07/13/1946, 76 y.o., female Today's Date: 07/20/2022      Past Medical History:  Diagnosis Date   Arthritis    Breast cancer, left [facility]) oncologist-- dr [name]   dx 09-26-2017--- IDC, Stage IA, Grade 2, ER/PR +;  HER2 negative;  s/p  breast lumpectomy w/ node dissection 11-02-2017;  completed radiation 01-09-2018 (left breast genetic screening panel 2017 with variant of unknown significance AXINA)   COPD (chronic obstructive pulmonary disease) ([place])    pulmologist-- dr Dillard Essex-- w/ chronic lingular scarring  (last exceratbation bronchiectasis 07/ 2020)   Dyslipidemia    followed by cardiology/ vascular-- dr g. adams ([facility] in [place])  family history strokes   Eczema    Family history of breast cancer    Family history of colon cancer    GERD (gastroesophageal reflux disease)    Hiatal hernia    History of esophageal stricture    s/p dilatation 2008   History of external beam radiation therapy    left breast 12-19-2017  to 01-09-2018   History of hyperthyroidism    endocrinologist-- (lov 04-11-2019 epic) dr Loanne Drilling, dx 2009 due to multinodular goiter ,  had taken medication for few months 2010 stopped due to norma TFT;     Insomnia    Multinodular goiter last thyroid ultrasound in epic 12-18-2018   endocrinologist-- dr Loanne Drilling,  dx 2009 w/ hyperthroidism with medication until normal TFT in 2010;     Osteopenia 02/2018   T score -2.4 distal third of radius.  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   Wears contact  lenses    Past Surgical History:  Procedure Laterality Date   BREAST LUMPECTOMY WITH RADIOACTIVE SEED AND SENTINEL LYMPH NODE BIOPSY Left 11/02/2017   Procedure: LEFT BREAST LUMPECTOMY WITH RADIOACTIVE SEED AND LEFT AXILLARY DEEP SENTINEL LYMPH NODE BIOPSY, INJECT BLUE DYE LEFT BREAST;  Surgeon: [name], MD;  Location: [place];  Service: General;  Laterality: Left;   BREAST SURGERY  2000   Breast cyst removed, left   LAPAROSCOPIC BILATERAL SALPINGO OOPHERECTOMY Bilateral 10/13/2019   Procedure: LAPAROSCOPIC BILATERAL SALPINGO OOPHORECTOMY LYSIS OF ADHESIONS PERITONEAL WASHINGS ;  Surgeon: [name], MD;  Location: [place];  Service: Gynecology;  Laterality: Bilateral;  request 7:30am OR time in [place] block requests one hour   TONSILLECTOMY  age 48   TUBAL LIGATION Bilateral yrs ago   VAGINAL HYSTERECTOMY  1991   Right ovarian cystectomy   VIDEO BRONCHOSCOPY Bilateral 03/16/2015   Procedure: VIDEO BRONCHOSCOPY WITHOUT FLUORO;  Surgeon: Brand Males, MD;  Location: [facility] ENDOSCOPY;  Service: Endoscopy;  Laterality: Bilateral;   Patient Active Problem List   Diagnosis Date Noted   Irritation of left eye 02/07/2022   Tinnitus 06/27/2019   Anxiety 03/29/2019   Insomnia 03/29/2019   Malignant neoplasm of lower-inner quadrant of left breast in female, estrogen receptor positive ([place]) 10/02/2017   Genetic testing 03/03/2016   Family history of breast cancer    Annual physical exam 07/27/2015  PCP NOTES >>>>>>> 07/27/2015   Smoking history 01/15/2015   Family hx of colon cancer 09/10/2014   Change in bowel habits 09/10/2014   Bronchiectasis ([place]) 07/28/2012   Osteopenia    Hypercholesteremia 10/03/2010   GOITER, MULTINODULAR 11/27/2008   UNSPECIFIED ANEMIA 11/02/2008   Thyrotoxicosis 10/22/2008   COLONIC POLYPS, BENIGN 01/16/2008   DEGENERATIVE JOINT DISEASE 01/16/2008   ESOPHAGEAL STRICTURE 04/05/2007   HIATAL HERNIA  08/15/2006   GERD 07/30/2006    PCP: Colon Branch, MD   REFERRING PROVIDER: [name], MD  REFERRING DIAG:  N39.3 (ICD-10-CM) - SUI (stress urinary incontinence, female)  N81.10 (ICD-10-CM) - Cystocele, unspecified    THERAPY DIAG:  No diagnosis found.  Rationale for Evaluation and Treatment Rehabilitation  ONSET DATE: 5-6 weeks ago is when I first noticed the prolapse  SUBJECTIVE:                                                                                                                                                                                           SUBJECTIVE STATEMENT: I had assessments today and am feeling tired.  Pt states every thing is about the same. Fluid intake: Yes: lot of fluid, water, tea Herbal     PAIN:  Are you having pain? No  PRECAUTIONS: None  WEIGHT BEARING RESTRICTIONS No  FALLS:  Has patient fallen in last 6 months? 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)  PLOF: Independent  PATIENT GOALS be able to strengthen so it doesn't pop  PERTINENT HISTORY:  Hystorectomy at age 61 Sexual abuse: No  BOWEL MOVEMENT Pain with bowel movement: No Type of bowel movement:Type (Bristol Stool Scale) normal, Frequency normal to loose, and Strain No Fully empty rectum: Yes:   Leakage: No Pads: No Fiber supplement: No  URINATION Pain with urination: No Fully empty bladder: No and frequent UTIs Stream:  weaker than it was Urgency: Yes: cannot hold it long Frequency: goes when convenient but can wait 2 hours in the afternoon, I control with not drinking Leakage: Walking to the bathroom and when holding it a long time Pads: Yes: because I have to call someone to proctor  INTERCOURSE   PREGNANCY Vaginal deliveries 2 (plus one miscarriage Tearing Yes: episiotomy PROLAPSE Cystocele       OBJECTIVE:   DIAGNOSTIC FINDINGS:    PATIENT SURVEYS:    PFIQ-7 = 57  COGNITION:  Overall cognitive status: Within functional limits for tasks assessed     SENSATION:    MUSCLE LENGTH: Hamstrings:  Right 60 deg; Left 70 deg Thomas test:   LUMBAR SPECIAL TESTS:  Straight leg raise test: Negative  FUNCTIONAL TESTS:    GAIT:  Comments: WFL                POSTURE: weight shift left, scoliosis, thoracic kyphosis and rounded shoulders   PELVIC ALIGNMENT:  LUMBARAROM/PROM  A/PROM A/PROM  eval  Flexion 60% +pain as she returns to standing and +pain after  Extension   Right lateral flexion   Left lateral flexion   Right rotation   Left rotation    (Blank rows = not tested)  LOWER EXTREMITY ROM:  Passive ROM Right eval Left eval  Hip flexion 75% +pain 75%+ pain  Hip extension    Hip abduction    Hip adduction    Hip internal rotation 50% +pain 50%+pain  Hip external rotation    Knee flexion    Knee extension    Ankle dorsiflexion    Ankle plantarflexion    Ankle inversion    Ankle eversion     (Blank rows = not tested)  LOWER EXTREMITY MMT:  MMT Right eval Left eval  Hip flexion    Hip extension    Hip abduction    Hip adduction    Hip internal rotation    Hip external rotation    Knee flexion    Knee extension    Ankle dorsiflexion    Ankle plantarflexion    Ankle inversion    Ankle eversion      PALPATION:   General  thoracic curve to the Rt and tight lumbar and thoracic paraspinals                External Perineal Exam redness around urethra and descended anterior wall                             Internal Pelvic Floor Lt levators minimal attachment to the pubic ramus; high tone of levators and slow to relax/contract  Patient confirms identification and approves PT to assess internal pelvic floor and treatment Yes No emotional/communication barriers or cognitive limitation. Patient is motivated to learn. Patient understands  and agrees with treatment goals and plan. PT explains patient will be examined in standing, sitting, and lying down to see how their muscles and joints work. When they are ready, they will be asked to remove their underwear so PT can examine their perineum. The patient is also given the option of providing their own chaperone as one is not provided in our facility. The patient also has the right and is explained the right to defer or refuse any part of the evaluation or treatment including the internal exam. With the patient's consent, PT will use one gloved finger to gently assess the muscles of the pelvic floor, seeing how well it contracts and relaxes and if there is muscle symmetry. After, the patient will get dressed and PT and patient will discuss exam findings and plan of care. PT and patient discuss plan of care, schedule, attendance policy and HEP activities.  PELVIC MMT:   MMT eval  Vaginal 2/5 x 10 slow reps; 1 rep at 3 sec hold  Internal Anal Sphincter   External Anal Sphincter   Puborectalis   Diastasis Recti   (Blank rows = not tested)        TONE: high  PROLAPSE: Anterior wall urethra and bladder  TODAY'S TREATMENT   Date: 10/25  [place] Re-ed: Education and cues for coordination of breathing  Pelvic floor muscle contracting and relaxing with tactile cues  Exercises: Kegel 2 ways with yoga block - educated on doing with exhale and TC/VC to assist for improved muscle activity Wall thoracic rotation - 3x bil Seated sidebending - 3 x bil Butterfly stretch LE hip rotations Single knee to chest - knee pain on Rt side so only did Lt Fwd flex in seated position Breathing and bulging pelvic floor seated  PATIENT EDUCATION: Education details: Access Code: [place] Person educated: Patient Education method: Consulting civil engineer, Demonstration, Tactile cues, Verbal cues, and Handouts Education comprehension: verbalized understanding and returned demonstration   HOME EXERCISE  PROGRAM: Access Code: [email or web address] URL: [email or web address] Date: 08/24/2022 Prepared by: Jari Favre  Exercises - Ball squeeze with Kegel  - 3 x daily - 7 x weekly - 1 sets - 10 reps - 3 sec hold - Supine Hip Internal and External Rotation  - 1 x daily - 7 x weekly - 1 sets - 10 reps - 5 sec hold - Thoracic Extension with Noodle/Towel  - 1 x daily - 7 x weekly - 3 sets - 10 reps - Supine Lower Trunk Rotation  - 1 x daily - 7 x weekly - 1 sets - 10 reps - 5 sec hold - Seated Flexion Stretch  - 1 x daily - 7 x weekly - 3 sets - 10 reps - Seated Sidebending Arms Overhead  - 1 x daily - 7 x weekly - 3 sets - 10 reps - Standing with Forearms Thoracic Rotation  - 1 x daily - 7 x weekly - 3 sets - 10 reps  ASSESSMENT:  CLINICAL IMPRESSION: Patient did well with stretches today and is able to correctly perform kegel.  Pt was given stretches to work on posture and improved trunk mobility.  She did well with kegel when adding some hip adduction in different positions.  Pt is somewhat limited due to patella dislocation which is worse on the Rt side.  She will benefit from skilled PT to continue to progress strength for improved bladder control.    OBJECTIVE IMPAIRMENTS decreased coordination, decreased endurance, decreased ROM, decreased strength, increased fascial restrictions, increased muscle spasms, impaired flexibility, impaired tone, postural dysfunction, and pain.   ACTIVITY LIMITATIONS sleeping and continence  PARTICIPATION LIMITATIONS: community activity  PERSONAL FACTORS 1-2 comorbidities: scoliosis, chronic back and knee pain; history of breast cancer and 3+ comorbidities:    are also affecting patient's functional outcome.   REHAB POTENTIAL: Excellent  CLINICAL DECISION MAKING: Evolving/moderate complexity  EVALUATION COMPLEXITY: Moderate   GOALS: Goals reviewed with patient? Yes  SHORT TERM GOALS: Target date: 08/16/2022  Ind with kegel and initial  HEP Baseline: Goal status: IN PROGRESS  2.  Pt will report feeling small amount of change in prolapse and leakage after consistently doing intial HEP Baseline:  Goal status: IN PROGRESS    LONG TERM GOALS: Target date: 10/12/2022   Pt will report 25% reduction of pain/discomfort from prolapse due to improvements in posture, strength, and muscle length  Baseline:  Goal status: INITIAL  2.  Pt will be able to functional actions such as walking to the bathroom after holding for 2 hours without leakage  Baseline:  Goal status: INITIAL  3.  Pt will be able to functional actions such as sit for 2 hours without having severe urge to void upon standing  Baseline:  Goal status: INITIAL  4.  Pt will be able to sustain pelvic  floor contraction for at least 10 sec hold for improved bladder control when walking Baseline:  Goal status: INITIAL  5.  Pt will be ind with urge techniques for reduced severity of urgency when walking to the bathroom  Baseline:  Goal status: INITIAL    PLAN: PT FREQUENCY: 1x/week  PT DURATION: 12 weeks  PLANNED INTERVENTIONS: Therapeutic exercises, Therapeutic activity, Neuromuscular re-education, Balance training, Gait training, Patient/Family education, Self Care, Joint mobilization, Dry Needling, Electrical stimulation, Cryotherapy, Moist heat, Taping, Biofeedback, Manual therapy, and Re-evaluation  PLAN FOR NEXT SESSION: f/u on initial HEP, progress kegel exericses; breathing and stretching throughout   [employer], PT 08/29/2022, 8:56 AM

## 2022-08-30 ENCOUNTER — Ambulatory Visit
Admission: EM | Admit: 2022-08-30 | Discharge: 2022-08-30 | Disposition: A | Discharge status: Home / Self Care | Payer: Medicare PPO | Attending: Urgent Care | Admitting: Urgent Care

## 2022-08-30 ENCOUNTER — Encounter: Payer: Self-pay | Admitting: Physical Therapy

## 2022-08-30 ENCOUNTER — Ambulatory Visit: Payer: Medicare PPO | Attending: Obstetrics & Gynecology | Admitting: Physical Therapy

## 2022-08-30 DIAGNOSIS — N3001 Acute cystitis with hematuria: Secondary | ICD-10-CM | POA: Diagnosis not present

## 2022-08-30 DIAGNOSIS — Z87440 Personal history of urinary (tract) infections: Secondary | ICD-10-CM | POA: Insufficient documentation

## 2022-08-30 DIAGNOSIS — N390 Urinary tract infection, site not specified: Secondary | ICD-10-CM | POA: Diagnosis not present

## 2022-08-30 DIAGNOSIS — R293 Abnormal posture: Secondary | ICD-10-CM | POA: Insufficient documentation

## 2022-08-30 DIAGNOSIS — M6281 Muscle weakness (generalized): Secondary | ICD-10-CM | POA: Insufficient documentation

## 2022-08-30 DIAGNOSIS — R944 Abnormal results of kidney function studies: Secondary | ICD-10-CM | POA: Insufficient documentation

## 2022-08-30 DIAGNOSIS — R279 Unspecified lack of coordination: Secondary | ICD-10-CM | POA: Insufficient documentation

## 2022-08-30 DIAGNOSIS — R309 Painful micturition, unspecified: Secondary | ICD-10-CM | POA: Insufficient documentation

## 2022-08-30 LAB — POCT URINALYSIS DIP (MANUAL ENTRY)
Bilirubin, UA: NEGATIVE
Glucose, UA: NEGATIVE mg/dL
Ketones, POC UA: NEGATIVE mg/dL
Nitrite, UA: NEGATIVE
Protein Ur, POC: NEGATIVE mg/dL
Spec Grav, UA: <=1.005 — AB (ref 1.010–1.025)
Urobilinogen, UA: 0.2 E.U./dL
pH, UA: 6 (ref 5.0–8.0)

## 2022-08-30 MED ORDER — CEPHALEXIN 500 MG PO CAPS: 500.0000 mg | ORAL_CAPSULE | Freq: Two times a day (BID) | ORAL | 0 refills | Status: DC

## 2022-08-30 NOTE — ED Triage Notes (Signed)
Pt states that she has some urinary frequency and pain with urination. X2 days

## 2022-08-30 NOTE — Discharge Instructions (Addendum)

## 2022-08-30 NOTE — ED Provider Notes (Signed)
[facility] - URGENT CARE CENTER  Note:  This document was prepared using Systems analyst and may include unintentional dictation errors.  MRN: [id number] DOB: [birth year]  Subjective:   Cynthia Thornton is a 76 y.o. female presenting for 2-day history of acute onset recurrent urinary frequency, dysuria and urinary urgency.  Patient has a history of UTIs.  She is also undergoing physical therapy for her pelvic floor.  Reports that she has had slight prolapse and is being managed by urogynecology for this.  She does have an urologist as well that she follows up with.  Tries to hydrate very well.  Avoids urinary irritants as much as she can.  Lab review shows that she has had decreased GFR.  Creatinine clearance was calculated at 43 mL/min using the most recent basic metabolic panel from 06/12/4817.  No current facility-administered medications for this encounter.  Current Outpatient Medications:    B Complex-C (B-COMPLEX WITH VITAMIN C) tablet, Take 1 tablet by mouth daily., Disp: , Rfl:    cetirizine (ZYRTEC) 10 MG tablet, Take 10 mg by mouth daily., Disp: , Rfl:    Cholecalciferol (VITAMIN D) 2000 units CAPS, Take 2,000 Units by mouth daily., Disp: , Rfl:    clorazepate (TRANXENE) 7.5 MG tablet, Take 1 tablet (7.5 mg total) by mouth at bedtime as needed for anxiety., Disp: 30 tablet, Rfl: 2   CRANBERRY PO, Take 2 tablets by mouth in the morning and at bedtime., Disp: , Rfl:    diazepam (VALIUM) 2 MG tablet, Take 2 mg by mouth every 6 (six) hours as needed., Disp: , Rfl:    famotidine (PEPCID) 20 MG tablet, Take 1 tablet (20 mg total) by mouth daily., Disp: 90 tablet, Rfl: 3   methenamine (HIPREX) 1 g tablet, Take 1 g by mouth 2 (two) times daily with a meal. Take with 527m of vitamin c w/ each tablet, Disp: , Rfl:    methocarbamol (ROBAXIN) 500 MG tablet, Take 500 mg by mouth every 8 (eight) hours as needed., Disp: , Rfl:    omeprazole (PRILOSEC) 40 MG capsule, Take 1  capsule (40 mg total) by mouth daily., Disp: 90 capsule, Rfl: 3   ondansetron (ZOFRAN-ODT) 4 MG disintegrating tablet, Take 1 tablet (4 mg total) by mouth every 8 (eight) hours as needed for nausea or vomiting., Disp: 30 tablet, Rfl: 0   Probiotic Product (PROBIOTIC PO), Take by mouth., Disp: , Rfl:    valACYclovir (VALTREX) 500 MG tablet, Take 1 tablet (500 mg total) by mouth daily. Prophylaxis for HSV 1., Disp: 90 tablet, Rfl: 4   Allergies  Allergen Reactions   Doxycycline Rash   Other Other (See Comments)    Surgical glue : hives   Oxycodone Nausea And Vomiting   Sulfa Antibiotics Hives   Trimethoprim Sulfate [Trimethoprim]     rash   Codeine Nausea Only   Propranolol Hcl Other (See Comments)    "Dizziness and heart racing"    Past Medical History:  Diagnosis Date   Arthritis    Breast cancer, left ([facility] oncologist-- dr gLindi Adie  dx 09-26-2017--- IDC, Stage IA, Grade 2, ER/PR +;  HER2 negative;  s/p  breast lumpectomy w/ node dissection 11-02-2017;  completed radiation 01-09-2018 (left breast genetic screening panel 2017 with variant of unknown significance AXINA)   COPD ([place] disease) [place]    pulmologist-- dr rDillard Essex- w/ chronic lingular scarring  (last exceratbation bronchiectasis 07/ 2020)   Dyslipidemia    followed by  cardiology/ vascular-- dr g. adams ([facility] in [place])  family history strokes   Eczema    Family history of breast cancer    Family history of colon cancer    GERD (gastroesophageal reflux disease)    Hiatal hernia    History of esophageal stricture    s/p dilatation 2008   History of external beam radiation therapy    left breast 12-19-2017  to 01-09-2018   History of hyperthyroidism    endocrinologist-- (lov 04-11-2019 epic) dr Loanne Drilling, dx 2009 due to multinodular goiter ,  had taken medication for few months 2010 stopped due to norma TFT;     Insomnia    Multinodular goiter last thyroid ultrasound in epic  12-18-2018   endocrinologist-- dr Loanne Drilling,  dx 2009 w/ hyperthroidism with medication until normal TFT in 2010;     Osteopenia 02/2018   T score -2.4 distal third of radius.  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   Wears contact lenses      Past Surgical History:  Procedure Laterality Date   BREAST LUMPECTOMY WITH RADIOACTIVE SEED AND SENTINEL LYMPH NODE BIOPSY Left 11/02/2017   Procedure: LEFT BREAST LUMPECTOMY WITH RADIOACTIVE SEED AND LEFT AXILLARY DEEP SENTINEL LYMPH NODE BIOPSY, INJECT BLUE DYE LEFT BREAST;  Surgeon: [name], MD;  Location: [place];  Service: General;  Laterality: Left;   BREAST SURGERY  2000   Breast cyst removed, left   LAPAROSCOPIC BILATERAL SALPINGO OOPHERECTOMY Bilateral 10/13/2019   Procedure: LAPAROSCOPIC BILATERAL SALPINGO OOPHORECTOMY LYSIS OF ADHESIONS PERITONEAL WASHINGS ;  Surgeon: [name], MD;  Location: [place];  Service: Gynecology;  Laterality: Bilateral;  request 7:30am OR time in [place] block requests one hour   TONSILLECTOMY  age 55   TUBAL LIGATION Bilateral yrs ago   VAGINAL HYSTERECTOMY  1991   Right ovarian cystectomy   VIDEO BRONCHOSCOPY Bilateral 03/16/2015   Procedure: VIDEO BRONCHOSCOPY WITHOUT FLUORO;  Surgeon: Brand Males, MD;  Location: [facility] ENDOSCOPY;  Service: Endoscopy;  Laterality: Bilateral;    Family History  Problem Relation Age of Onset   Osteoporosis Mother    Lung cancer Mother    Stroke Mother    Diabetes Father    Hypertension Father    Osteoporosis Father    Colon cancer Father 7   Breast cancer Sister         Age 79   Leukemia Maternal Grandfather    Crohn's disease Son    Ovarian cancer Maternal Aunt 64   Breast cancer Maternal Aunt        great aunt- Age unknown   Colon cancer Paternal Uncle 43   Breast cancer Cousin        maternal-Age 75   Colon cancer Cousin        maternal first cousin    Social History   Tobacco Use   Smoking status: Former    Packs/day: 0.10    Years: 30.00    Total pack years: 3.00    Types: Cigarettes    Quit date: 10/31/1995    Years since quitting:  26.8   Smokeless tobacco: Never  Vaping Use   Vaping Use: Never used  Substance Use Topics   Alcohol use: Yes    Comment: occ   Drug use: No    ROS   Objective:   Vitals: BP 110/71 (BP Location: Right Arm)   Pulse 70   Temp (!) 97.4 F (36.3 C) (Oral)   Resp 16   Ht _0  (1.575 m)   Wt 141 lb (64 kg)   SpO2 97%   BMI 25.79 kg/m   Physical Exam Constitutional:      General: She is not in acute distress.    Appearance: Normal appearance. She is well-developed. She is not ill-appearing, toxic-appearing or diaphoretic.  HENT:     Head: Normocephalic and atraumatic.     Nose: Nose normal.     Mouth/Throat:     Mouth: Mucous membranes are moist.  Eyes:     General: No scleral icterus.       Right eye: No discharge.        Left eye: No discharge.     Extraocular Movements: Extraocular movements intact.  Cardiovascular:     Rate and Rhythm: Normal rate.  Pulmonary:     Effort: Pulmonary effort is normal.  Skin:    General: Skin is warm and dry.  Neurological:     General: No focal deficit present.     Mental Status: She is alert and oriented to person, place, and time.  Psychiatric:        Mood and Affect: Mood normal.        Behavior: Behavior normal.        Thought Content: Thought content normal.        Judgment: Judgment normal.     Results for orders placed or performed during the hospital encounter of 08/30/22 (from the past 24 hour(s))  POCT urinalysis dipstick      Status: Abnormal   Collection Time: 08/30/22  1:32 PM  Result Value Ref Range   Color, UA yellow yellow   Clarity, UA cloudy (A) clear   Glucose, UA negative negative mg/dL   Bilirubin, UA negative negative   Ketones, POC UA negative negative mg/dL   Spec Grav, UA <=1.005 (A) 1.010 - 1.025   Blood, UA moderate (A) negative   pH, UA 6.0 5.0 - 8.0   Protein Ur, POC negative negative mg/dL   Urobilinogen, UA 0.2 0.2 or 1.0 E.U./dL   Nitrite, UA Negative Negative   Leukocytes, UA Large (3+) (A) Negative    Assessment and Plan :   PDMP not reviewed this encounter.  1. Acute cystitis with hematuria   2. Pain with urination   3. History of UTI   4. Decreased GFR     Recommended using Keflex again taking into account her medication allergies and also creatinine clearance.  Urine culture pending.  Continue hydration.  Follow-up with her urologist and PCP.  Keep the appointments for pelvic floor PT. Counseled patient on potential for adverse effects with medications prescribed/recommended today, ER and return-to-clinic precautions discussed, patient verbalized understanding.    Jaynee Eagles, PA-C 08/30/22 1731

## 2022-09-01 LAB — URINE CULTURE: Culture: >=100000 — AB

## 2022-09-07 ENCOUNTER — Encounter: Payer: Self-pay | Admitting: Physical Therapy

## 2022-09-07 ENCOUNTER — Ambulatory Visit: Payer: Medicare PPO | Admitting: Physical Therapy

## 2022-09-07 DIAGNOSIS — R293 Abnormal posture: Secondary | ICD-10-CM

## 2022-09-07 DIAGNOSIS — M6281 Muscle weakness (generalized): Secondary | ICD-10-CM

## 2022-09-07 DIAGNOSIS — R279 Unspecified lack of coordination: Secondary | ICD-10-CM

## 2022-09-07 NOTE — Therapy (Signed)
OUTPATIENT PHYSICAL THERAPY FEMALE PELVIC TREATMENT   Patient Name: Cynthia Thornton MRN: [id number] DOB:1946/07/29, 76 y.o., female Today's Date: 07/20/2022   PT End of Session - 09/07/22 1405     Visit Number 4    Date for PT Re-Evaluation 10/12/22    Authorization Type [employer] Approved 10 visits, 07/20/2022-10/12/2022    PT Start Time 1404    PT Stop Time 1444    PT Time Calculation (min) 40 min    Activity Tolerance Patient tolerated treatment well    Behavior During Therapy [facility] for tasks assessed/performed                Past Medical History:  Diagnosis Date   Arthritis    Breast cancer, left [facility]) oncologist-- dr [name]   dx 09-26-2017--- IDC, Stage IA, Grade 2, ER/PR +;  HER2 negative;  s/p  breast lumpectomy w/ node dissection 11-02-2017;  completed radiation 01-09-2018 (left breast genetic screening panel 2017 with variant of unknown significance AXINA)   COPD (chronic obstructive pulmonary disease) ([place])    pulmologist-- dr Dillard Essex-- w/ chronic lingular scarring  (last exceratbation bronchiectasis 07/ 2020)   Dyslipidemia    followed by cardiology/ vascular-- dr g. adams ([facility] in [place])  family history strokes   Eczema    Family history of breast cancer    Family history of colon cancer    GERD (gastroesophageal reflux disease)    Hiatal hernia    History of esophageal stricture    s/p dilatation 2008   History of external beam radiation therapy    left breast 12-19-2017  to 01-09-2018   History of hyperthyroidism    endocrinologist-- (lov 04-11-2019 epic) dr Loanne Drilling, dx 2009 due to multinodular goiter ,  had taken medication for few months 2010 stopped due to norma TFT;     Insomnia    Multinodular goiter last thyroid ultrasound in epic 12-18-2018   endocrinologist-- dr Loanne Drilling,  dx 2009 w/ hyperthroidism with medication until normal TFT in 2010;     Osteopenia 02/2018   T score -2.4 distal third of radius.  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   Wears contact lenses    Past Surgical History:  Procedure Laterality Date   BREAST LUMPECTOMY WITH RADIOACTIVE SEED AND SENTINEL LYMPH NODE BIOPSY Left 11/02/2017   Procedure: LEFT BREAST LUMPECTOMY WITH RADIOACTIVE SEED AND LEFT AXILLARY DEEP SENTINEL LYMPH NODE BIOPSY, INJECT BLUE DYE LEFT BREAST;  Surgeon: [name], MD;  Location: [place];  Service: General;  Laterality: Left;   BREAST SURGERY  2000   Breast cyst removed, left   LAPAROSCOPIC BILATERAL SALPINGO OOPHERECTOMY Bilateral 10/13/2019   Procedure: LAPAROSCOPIC BILATERAL SALPINGO OOPHORECTOMY LYSIS OF ADHESIONS PERITONEAL WASHINGS ;  Surgeon: [name], MD;  Location: [place];  Service: Gynecology;  Laterality: Bilateral;  request 7:30am OR time in [place] block requests one hour   TONSILLECTOMY  age 8   TUBAL LIGATION Bilateral yrs ago   VAGINAL HYSTERECTOMY  1991   Right ovarian cystectomy   VIDEO BRONCHOSCOPY Bilateral 03/16/2015  Procedure: VIDEO BRONCHOSCOPY WITHOUT FLUORO;  Surgeon: Brand Males, MD;  Location: [facility];  Service: Endoscopy;  Laterality: Bilateral;   Patient Active Problem List   Diagnosis Date Noted   Irritation of left eye 02/07/2022   Tinnitus 06/27/2019   Anxiety 03/29/2019   Insomnia 03/29/2019   Malignant neoplasm of lower-inner quadrant of left breast in female, estrogen receptor positive ([place]) 10/02/2017   Genetic testing 03/03/2016   Family history of breast cancer    Annual physical exam 07/27/2015   PCP NOTES >>>>>>> 07/27/2015   Smoking history 01/15/2015    Family hx of colon cancer 09/10/2014   Change in bowel habits 09/10/2014   Bronchiectasis ([place]) 07/28/2012   Osteopenia    Hypercholesteremia 10/03/2010   GOITER, MULTINODULAR 11/27/2008   UNSPECIFIED ANEMIA 11/02/2008   Thyrotoxicosis 10/22/2008   COLONIC POLYPS, BENIGN 01/16/2008   DEGENERATIVE JOINT DISEASE 01/16/2008   ESOPHAGEAL STRICTURE 04/05/2007   HIATAL HERNIA 08/15/2006   GERD 07/30/2006    PCP: Colon Branch, MD   REFERRING PROVIDER: [name], MD  REFERRING DIAG:  N39.3 (ICD-10-CM) - SUI (stress urinary incontinence, female)  N81.10 (ICD-10-CM) - Cystocele, unspecified    THERAPY DIAG:  Muscle weakness (generalized)  Unspecified lack of coordination  Abnormal posture  Rationale for Evaluation and Treatment Rehabilitation  ONSET DATE: 5-6 weeks ago is when I first noticed the prolapse  SUBJECTIVE:                                                                                                                                                                                           SUBJECTIVE STATEMENT: I did notice that I was able to get off the floor without using my hands which was a HUGE deal.  I can't believe it.  I made two stops when grocery shopping and voided at both of those places . Then when I got home and put the key in the door I had some leakage. Fluid intake: Yes: lot of fluid, water, tea Herbal     PAIN:  Are you having pain? No  PRECAUTIONS: None  WEIGHT BEARING RESTRICTIONS No  FALLS:  Has patient fallen in last 6 months? 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)  PLOF: Independent  PATIENT GOALS be able to strengthen so it doesn't pop  PERTINENT HISTORY:  Hystorectomy at age 83 Sexual abuse: No  BOWEL MOVEMENT Pain with bowel movement: No Type of bowel movement:Type  (Bristol Stool Scale) normal, Frequency normal to loose, and Strain No  Fully empty rectum: Yes:   Leakage: No Pads: No Fiber supplement: No  URINATION Pain with urination: No Fully empty bladder: No and frequent UTIs Stream:  weaker than it was Urgency: Yes: cannot hold it long Frequency: goes when convenient but can wait 2 hours in the afternoon, I control with not drinking Leakage: Walking to the bathroom and when holding it a long time Pads: Yes: because I have to call someone to proctor  INTERCOURSE   PREGNANCY Vaginal deliveries 2 (plus one miscarriage Tearing Yes: episiotomy PROLAPSE Cystocele      OBJECTIVE:   DIAGNOSTIC FINDINGS:    PATIENT SURVEYS:    PFIQ-7 = 57  COGNITION:  Overall cognitive status: Within functional limits for tasks assessed     SENSATION:    MUSCLE LENGTH: Hamstrings: Right 60 deg; Left 70 deg Thomas test:   LUMBAR SPECIAL TESTS:  Straight leg raise test: Negative  FUNCTIONAL TESTS:    GAIT:  Comments: WFL                POSTURE: weight shift left, scoliosis, thoracic kyphosis and rounded shoulders   PELVIC ALIGNMENT:  LUMBARAROM/PROM  A/PROM A/PROM  eval  Flexion 60% +pain as she returns to standing and +pain after  Extension   Right lateral flexion   Left lateral flexion   Right rotation   Left rotation    (Blank rows = not tested)  LOWER EXTREMITY ROM:  Passive ROM Right eval Left eval  Hip flexion 75% +pain 75%+ pain  Hip extension    Hip abduction    Hip adduction    Hip internal rotation 50% +pain 50%+pain  Hip external rotation    Knee flexion    Knee extension    Ankle dorsiflexion    Ankle plantarflexion    Ankle inversion    Ankle eversion     (Blank rows = not tested)  LOWER EXTREMITY MMT:  MMT Right eval Left eval  Hip flexion    Hip extension    Hip abduction    Hip adduction    Hip internal rotation    Hip external rotation    Knee flexion    Knee extension    Ankle  dorsiflexion    Ankle plantarflexion    Ankle inversion    Ankle eversion      PALPATION:   General  thoracic curve to the Rt and tight lumbar and thoracic paraspinals                External Perineal Exam redness around urethra and descended anterior wall                             Internal Pelvic Floor Lt levators minimal attachment to the pubic ramus; high tone of levators and slow to relax/contract  Patient confirms identification and approves PT to assess internal pelvic floor and treatment Yes No emotional/communication barriers or cognitive limitation. Patient is motivated to learn. Patient understands and agrees with treatment goals and plan. PT explains patient will be examined in standing, sitting, and lying down to see how their muscles and joints work. When they are ready, they will be asked to remove their underwear so PT can examine their perineum. The patient is also given the option of providing their own chaperone as one is not provided in our facility. The patient also has the right and is explained the right to defer or refuse any part of  the evaluation or treatment including the internal exam. With the patient's consent, PT will use one gloved finger to gently assess the muscles of the pelvic floor, seeing how well it contracts and relaxes and if there is muscle symmetry. After, the patient will get dressed and PT and patient will discuss exam findings and plan of care. PT and patient discuss plan of care, schedule, attendance policy and HEP activities.  PELVIC MMT:   MMT eval  Vaginal 2/5 x 10 slow reps; 1 rep at 3 sec hold  Internal Anal Sphincter   External Anal Sphincter   Puborectalis   Diastasis Recti   (Blank rows = not tested)        TONE: high  PROLAPSE: Anterior wall urethra and bladder  TODAY'S TREATMENT   Date: 09/07/22  Nuero Re-ed: Education and cues for coordination of breathing  Pelvic floor muscle contracting and relaxing with tactile  cues  Exercises: Kegel with ball between thighs for less stress on knees - educated on doing with exhale and TC/VC to assist for improved muscle activity Wall thoracic rotation - 3x bil Seated ball squeeze LE hip rotations and lumbar rotation 3 x bil Standing shoulder ext - red band 20x Supine thoracic ext 10x Stepping forward with yellow and red band - transversus abdominus activation - 20x  Date: 08/30/22  Nuero Re-ed: Education and cues for coordination of breathing  Pelvic floor muscle contracting and relaxing with tactile cues  Exercises: Kegel 2 ways with yoga block - educated on doing with exhale and TC/VC to assist for improved muscle activity Wall thoracic rotation - 3x bil Seated sidebending - 3 x bil Butterfly stretch LE hip rotations and lumbar rotation 3 x bil Fwd flex in seated position Kegel with pelvic tilt - 10x then knees were hurting Thoracic and shoulder ext on the wall - 5 x Supine thoracic ext with towel 10x  Date: 10/25  Nuero Re-ed: Education and cues for coordination of breathing  Pelvic floor muscle contracting and relaxing with tactile cues  Exercises: Kegel 2 ways with yoga block - educated on doing with exhale and TC/VC to assist for improved muscle activity Wall thoracic rotation - 3x bil Seated sidebending - 3 x bil Butterfly stretch LE hip rotations Single knee to chest - knee pain on Rt side so only did Lt Fwd flex in seated position Breathing and bulging pelvic floor seated  PATIENT EDUCATION: Education details: Access Code: [email or web address] Person educated: Patient Education method: Consulting civil engineer, Demonstration, Tactile cues, Verbal cues, and Handouts Education comprehension: verbalized understanding and returned demonstration   HOME EXERCISE PROGRAM: Access Code: [email or web address] URL: [email or web address] Date: 08/24/2022 Prepared by: Jari Favre  Exercises - Ball squeeze with Kegel  - 3 x daily - 7 x weekly - 1 sets -  10 reps - 3 sec hold - Supine Hip Internal and External Rotation  - 1 x daily - 7 x weekly - 1 sets - 10 reps - 5 sec hold - Thoracic Extension with Noodle/Towel  - 1 x daily - 7 x weekly - 3 sets - 10 reps - Supine Lower Trunk Rotation  - 1 x daily - 7 x weekly - 1 sets - 10 reps - 5 sec hold - Seated Flexion Stretch  - 1 x daily - 7 x weekly - 3 sets - 10 reps - Seated Sidebending Arms Overhead  - 1 x daily - 7 x weekly - 3 sets - 10 reps - Standing with Forearms Thoracic Rotation  -  1 x daily - 7 x weekly - 3 sets - 10 reps  ASSESSMENT:  CLINICAL IMPRESSION: Pt has had less back pain after doing her HEP this week and was able to get up off the floor more easily.  Pt also having less bladder symptoms but is not sure if that has to do with UTI being resolved.  Pt was able to progress exercises and added standing and posture strength to HEP today.  Pt needed some TC and VC to do exercises and coordinate pelvic floor correctly. Pt will benefit from skilled PT to continue to address impairments for maximum function without leakage.   OBJECTIVE IMPAIRMENTS decreased coordination, decreased endurance, decreased ROM, decreased strength, increased fascial restrictions, increased muscle spasms, impaired flexibility, impaired tone, postural dysfunction, and pain.   ACTIVITY LIMITATIONS sleeping and continence  PARTICIPATION LIMITATIONS: community activity  PERSONAL FACTORS 1-2 comorbidities: scoliosis, chronic back and knee pain; history of breast cancer and 3+ comorbidities:    are also affecting patient's functional outcome.   REHAB POTENTIAL: Excellent  CLINICAL DECISION MAKING: Evolving/moderate complexity  EVALUATION COMPLEXITY: Moderate   GOALS: Goals reviewed with patient? Yes  SHORT TERM GOALS: Target date: 08/16/2022 Updated 09/07/22  Ind with kegel and initial HEP Baseline: Goal status: MET  2.  Pt will report feeling small amount of change in prolapse and leakage after  consistently doing intial HEP Baseline:  Goal status: IN PROGRESS    LONG TERM GOALS: Target date: 10/12/2022   Pt will report 25% reduction of pain/discomfort from prolapse due to improvements in posture, strength, and muscle length  Baseline:  Goal status: INITIAL  2.  Pt will be able to functional actions such as walking to the bathroom after holding for 2 hours without leakage  Baseline:  Goal status: INITIAL  3.  Pt will be able to functional actions such as sit for 2 hours without having severe urge to void upon standing  Baseline:  Goal status: INITIAL  4.  Pt will be able to sustain pelvic floor contraction for at least 10 sec hold for improved bladder control when walking Baseline:  Goal status: INITIAL  5.  Pt will be ind with urge techniques for reduced severity of urgency when walking to the bathroom  Baseline:  Goal status: INITIAL    PLAN: PT FREQUENCY: 1x/week  PT DURATION: 12 weeks  PLANNED INTERVENTIONS: Therapeutic exercises, Therapeutic activity, Neuromuscular re-education, Balance training, Gait training, Patient/Family education, Self Care, Joint mobilization, Dry Needling, Electrical stimulation, Cryotherapy, Moist heat, Taping, Biofeedback, Manual therapy, and Re-evaluation  PLAN FOR NEXT SESSION: f/u on standing and kegels with ball squeeze; continue postural strength, staggered stance and stepping with kegel   Camillo Flaming Marvella Jenning, PT 09/07/2022, 2:07 PM

## 2022-09-14 ENCOUNTER — Ambulatory Visit: Payer: Medicare PPO | Admitting: Physical Therapy

## 2022-09-14 DIAGNOSIS — R279 Unspecified lack of coordination: Secondary | ICD-10-CM

## 2022-09-14 DIAGNOSIS — R293 Abnormal posture: Secondary | ICD-10-CM | POA: Diagnosis not present

## 2022-09-14 DIAGNOSIS — M6281 Muscle weakness (generalized): Secondary | ICD-10-CM

## 2022-09-14 NOTE — Therapy (Addendum)
OUTPATIENT PHYSICAL THERAPY FEMALE PELVIC TREATMENT   Patient Name: Cynthia Thornton MRN: [id number] DOB:[birth year], 76 y.o., female Today's Date: 07/20/2022   PT End of Session - 09/14/22 1452     Visit Number 5    Date for PT Re-Evaluation 12/07/22    Authorization Type [employer] Approved 10 visits, 07/20/2022-10/12/2022    PT Start Time 1450    PT Stop Time 1528    PT Time Calculation (min) 38 min    Activity Tolerance Patient tolerated treatment well    Behavior During Therapy [facility] for tasks assessed/performed                 Past Medical History:  Diagnosis Date   Arthritis    Breast cancer, left [facility]) oncologist-- dr [name]   dx 09-26-2017--- IDC, Stage IA, Grade 2, ER/PR +;  HER2 negative;  s/p  breast lumpectomy w/ node dissection 11-02-2017;  completed radiation 01-09-2018 (left breast genetic screening panel 2017 with variant of unknown significance AXINA)   COPD (chronic obstructive pulmonary disease) ([place])    pulmologist-- dr Dillard Essex-- w/ chronic lingular scarring  (last exceratbation bronchiectasis 07/ 2020)   Dyslipidemia    followed by cardiology/ vascular-- dr g. adams ([facility] in [place])  family history strokes   Eczema    Family history of breast cancer    Family history of colon cancer    GERD (gastroesophageal reflux disease)    Hiatal hernia    History of esophageal stricture    s/p dilatation 2008   History of external beam radiation therapy    left breast 12-19-2017  to 01-09-2018   History of hyperthyroidism    endocrinologist-- (lov 04-11-2019 epic) dr Loanne Drilling, dx 2009 due to multinodular goiter ,  had taken medication for few months 2010 stopped due to norma TFT;     Insomnia    Multinodular goiter last thyroid ultrasound in epic 12-18-2018   endocrinologist-- dr Loanne Drilling,  dx 2009 w/ hyperthroidism with medication until normal TFT in 2010;     Osteopenia 02/2018   T score -2.4 distal third of radius.  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   Wears contact lenses    Past Surgical History:  Procedure Laterality Date   BREAST LUMPECTOMY WITH RADIOACTIVE SEED AND SENTINEL LYMPH NODE BIOPSY Left 11/02/2017   Procedure: LEFT BREAST LUMPECTOMY WITH RADIOACTIVE SEED AND LEFT AXILLARY DEEP SENTINEL LYMPH NODE BIOPSY, INJECT BLUE DYE LEFT BREAST;  Surgeon: [name], MD;  Location: [place];  Service: General;  Laterality: Left;   BREAST SURGERY  2000   Breast cyst removed, left   LAPAROSCOPIC BILATERAL SALPINGO OOPHERECTOMY Bilateral 10/13/2019   Procedure: LAPAROSCOPIC BILATERAL SALPINGO OOPHORECTOMY LYSIS OF ADHESIONS PERITONEAL WASHINGS ;  Surgeon: [name], MD;  Location: [facility];  Service: Gynecology;  Laterality: Bilateral;  request 7:30am OR time in [place] block requests one hour   TONSILLECTOMY  age 59   TUBAL LIGATION Bilateral yrs ago   VAGINAL HYSTERECTOMY  1991   Right ovarian cystectomy   VIDEO BRONCHOSCOPY Bilateral 03/16/2015  Procedure: VIDEO BRONCHOSCOPY WITHOUT FLUORO;  Surgeon: Brand Males, MD;  Location: [facility] ENDOSCOPY;  Service: Endoscopy;  Laterality: Bilateral;   Patient Active Problem List   Diagnosis Date Noted   Irritation of left eye 02/07/2022   Tinnitus 06/27/2019   Anxiety 03/29/2019   Insomnia 03/29/2019   Malignant neoplasm of lower-inner quadrant of left breast in female, estrogen receptor positive ([place]) 10/02/2017   Genetic testing 03/03/2016   Family history of breast cancer    Annual physical exam 07/27/2015   PCP NOTES >>>>>>> 07/27/2015   Smoking history 01/15/2015    Family hx of colon cancer 09/10/2014   Change in bowel habits 09/10/2014   Bronchiectasis ([place]) 07/28/2012   Osteopenia    Hypercholesteremia 10/03/2010   GOITER, MULTINODULAR 11/27/2008   UNSPECIFIED ANEMIA 11/02/2008   Thyrotoxicosis 10/22/2008   COLONIC POLYPS, BENIGN 01/16/2008   DEGENERATIVE JOINT DISEASE 01/16/2008   ESOPHAGEAL STRICTURE 04/05/2007   HIATAL HERNIA 08/15/2006   GERD 07/30/2006    PCP: Colon Branch, MD   REFERRING PROVIDER: [name], MD  REFERRING DIAG:  N39.3 (ICD-10-CM) - SUI (stress urinary incontinence, female)  N81.10 (ICD-10-CM) - Cystocele, unspecified    THERAPY DIAG:  Muscle weakness (generalized)  Unspecified lack of coordination  Abnormal posture  Rationale for Evaluation and Treatment Rehabilitation  ONSET DATE: 5-6 weeks ago is when I first noticed the prolapse  SUBJECTIVE:                                                                                                                                                                                           SUBJECTIVE STATEMENT: I I still have the tennis ball feeling.  I am not sure about the bladder and I still need 2 bathroom breaks.  My back is still feeling better and I am so much more mobile.  I can hold my bladder much longer and previously 50-60 minutes was as long as I can hold it and now about an hour and a half at most.  Fluid intake: Yes: lot of fluid, water, tea Herbal     PAIN:  Are you having pain? No  PRECAUTIONS: None  WEIGHT BEARING RESTRICTIONS No  FALLS:  Has patient fallen in last 6 months? 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)  PLOF: Independent  PATIENT GOALS be able to strengthen so it doesn't pop  PERTINENT HISTORY:  Hystorectomy at age 55 Sexual abuse: No  BOWEL MOVEMENT Pain with bowel  movement: No Type of bowel movement:Type [employer]  Stool Scale) normal, Frequency normal to loose, and Strain No Fully empty rectum: Yes:   Leakage: No Pads: No Fiber supplement: No  URINATION Pain with urination: No Fully empty bladder: No and frequent UTIs Stream:  weaker than it was Urgency: Yes: cannot hold it long Frequency: goes when convenient but can wait 2 hours in the afternoon, I control with not drinking Leakage: Walking to the bathroom and when holding it a long time Pads: Yes: because I have to call someone to proctor  INTERCOURSE   PREGNANCY Vaginal deliveries 2 (plus one miscarriage Tearing Yes: episiotomy PROLAPSE Cystocele      OBJECTIVE:   DIAGNOSTIC FINDINGS:    PATIENT SURVEYS:    PFIQ-7 = 57  COGNITION:  Overall cognitive status: Within functional limits for tasks assessed     SENSATION:    MUSCLE LENGTH: Hamstrings: Right 60 deg; Left 70 deg Thomas test:   LUMBAR SPECIAL TESTS:  Straight leg raise test: Negative  FUNCTIONAL TESTS:    GAIT:  Comments: WFL                POSTURE: weight shift left, scoliosis, thoracic kyphosis and rounded shoulders   PELVIC ALIGNMENT:  LUMBARAROM/PROM  A/PROM A/PROM  eval  Flexion 60% +pain as she returns to standing and +pain after  Extension   Right lateral flexion   Left lateral flexion   Right rotation   Left rotation    (Blank rows = not tested)  LOWER EXTREMITY ROM:  Passive ROM Right eval Left eval  Hip flexion 75% +pain 75%+ pain  Hip extension    Hip abduction    Hip adduction    Hip internal rotation 50% +pain 50%+pain  Hip external rotation    Knee flexion    Knee extension    Ankle dorsiflexion    Ankle plantarflexion    Ankle inversion    Ankle eversion     (Blank rows = not tested)  LOWER EXTREMITY MMT:  MMT Right eval Left eval  Hip flexion    Hip extension    Hip abduction    Hip adduction    Hip internal rotation    Hip external rotation    Knee  flexion    Knee extension    Ankle dorsiflexion    Ankle plantarflexion    Ankle inversion    Ankle eversion      PALPATION:   General  thoracic curve to the Rt and tight lumbar and thoracic paraspinals                External Perineal Exam redness around urethra and descended anterior wall                             Internal Pelvic Floor Lt levators minimal attachment to the pubic ramus; high tone of levators and slow to relax/contract  Patient confirms identification and approves PT to assess internal pelvic floor and treatment Yes No emotional/communication barriers or cognitive limitation. Patient is motivated to learn. Patient understands and agrees with treatment goals and plan. PT explains patient will be examined in standing, sitting, and lying down to see how their muscles and joints work. When they are ready, they will be asked to remove their underwear so PT can examine their perineum. The patient is also given the option of providing their own chaperone as one is not provided in our facility. The patient also has the right and is  explained the right to defer or refuse any part of the evaluation or treatment including the internal exam. With the patient's consent, PT will use one gloved finger to gently assess the muscles of the pelvic floor, seeing how well it contracts and relaxes and if there is muscle symmetry. After, the patient will get dressed and PT and patient will discuss exam findings and plan of care. PT and patient discuss plan of care, schedule, attendance policy and HEP activities.  PELVIC MMT:   MMT eval  Vaginal 2/5 x 10 slow reps; 1 rep at 3 sec hold  Internal Anal Sphincter   External Anal Sphincter   Puborectalis   Diastasis Recti   (Blank rows = not tested)        TONE: high  PROLAPSE: Anterior wall urethra and bladder  TODAY'S TREATMENT   Date: 09/14/22  Nuero Re-ed: Education and cues for coordination of breathing  Pelvic floor muscle  contracting and relaxing with tactile cues  Exercises: Kegel with ball between thighs for less stress on knees - educated on doing with exhale and TC/VC to assist for improved muscle activity Thoracic rotation - 7lb - 20x each side at pulleys Shoulder ext pulleys - 7 lb each - 20x Pball roll up the wall 10x Seated h/s stretch on vibration plate Standing shoulder ext - red band 20x Supine thoracic ext 10x Stepping forward with yellow and red band - transversus abdominus activation - 20x  Date: 09/07/22  Nuero Re-ed: Education and cues for coordination of breathing  Pelvic floor muscle contracting and relaxing with tactile cues  Exercises: Kegel with ball between thighs for less stress on knees - educated on doing with exhale and TC/VC to assist for improved muscle activity Wall thoracic rotation - 3x bil Seated ball squeeze LE hip rotations and lumbar rotation 3 x bil Standing shoulder ext - red band 20x Supine thoracic ext 10x Stepping forward with yellow and red band - transversus abdominus activation - 20x  Date: 08/30/22  Nuero Re-ed: Education and cues for coordination of breathing  Pelvic floor muscle contracting and relaxing with tactile cues  Exercises: Kegel 2 ways with yoga block - educated on doing with exhale and TC/VC to assist for improved muscle activity Wall thoracic rotation - 3x bil Seated sidebending - 3 x bil Butterfly stretch LE hip rotations and lumbar rotation 3 x bil Fwd flex in seated position Kegel with pelvic tilt - 10x then knees were hurting Thoracic and shoulder ext on the wall - 5 x Supine thoracic ext with towel 10x  Date: 10/25  Nuero Re-ed: Education and cues for coordination of breathing  Pelvic floor muscle contracting and relaxing with tactile cues  Exercises: Kegel 2 ways with yoga block - educated on doing with exhale and TC/VC to assist for improved muscle activity Wall thoracic rotation - 3x bil Seated sidebending - 3 x  bil Butterfly stretch LE hip rotations Single knee to chest - knee pain on Rt side so only did Lt Fwd flex in seated position Breathing and bulging pelvic floor seated  PATIENT EDUCATION: Education details: Access Code: [email or web address] Person educated: Patient Education method: Consulting civil engineer, Demonstration, Corporate treasurer cues, Verbal cues, and Handouts Education comprehension: verbalized understanding and returned demonstration   HOME EXERCISE PROGRAM: Access Code: [email or web address] URL: [email or web address] Date: 08/24/2022 Prepared by: Jari Favre  Exercises - Ball squeeze with Kegel  - 3 x daily - 7 x weekly - 1 sets - 10 reps - 3 sec hold - Supine Hip  Internal and External Rotation  - 1 x daily - 7 x weekly - 1 sets - 10 reps - 5 sec hold - Thoracic Extension with Noodle/Towel  - 1 x daily - 7 x weekly - 3 sets - 10 reps - Supine Lower Trunk Rotation  - 1 x daily - 7 x weekly - 1 sets - 10 reps - 5 sec hold - Seated Flexion Stretch  - 1 x daily - 7 x weekly - 3 sets - 10 reps - Seated Sidebending Arms Overhead  - 1 x daily - 7 x weekly - 3 sets - 10 reps - Standing with Forearms Thoracic Rotation  - 1 x daily - 7 x weekly - 3 sets - 10 reps  ASSESSMENT:  CLINICAL IMPRESSION: Pt has had improved mobility and improved ability to hold bladder longer.  Today is pt's 4th follow up visit and is unable to get back in until January.  Pt is making steady progress and is expected to make continued progress with skilled PT.  She has been able to progress with more advanced strength today and added exercises to the HEP.  Pt needed some TC and VC to do exercises and coordinate pelvic floor and for improved posture during exercises. Pt will benefit from skilled PT to continue to address impairments for maximum function without leakage.   OBJECTIVE IMPAIRMENTS decreased coordination, decreased endurance, decreased ROM, decreased strength, increased fascial restrictions, increased muscle  spasms, impaired flexibility, impaired tone, postural dysfunction, and pain.   ACTIVITY LIMITATIONS sleeping and continence  PARTICIPATION LIMITATIONS: community activity  PERSONAL FACTORS 1-2 comorbidities: scoliosis, chronic back and knee pain; history of breast cancer and 3+ comorbidities:    are also affecting patient's functional outcome.   REHAB POTENTIAL: Excellent  CLINICAL DECISION MAKING: Evolving/moderate complexity  EVALUATION COMPLEXITY: Moderate   GOALS: Goals reviewed with patient? Yes  SHORT TERM GOALS: Target date:  Updated 09/07/22  Ind with kegel and initial HEP Baseline: Goal status: MET  2.  Pt will report feeling small amount of change in prolapse and leakage after consistently doing intial HEP Baseline: that seems a little better Goal status: MET    LONG TERM GOALS: Target date: 12/07/2022 Updated 09/14/22  Pt will report 25% reduction of pain/discomfort from prolapse due to improvements in posture, strength, and muscle length  Baseline: same feeling of prolapse but back is significantly better Goal status: IN PROGRESS  2.  Pt will be able to functional actions such as walking to the bathroom after holding for 2 hours without leakage  Baseline: 1.5 hours up from 50-60 min Goal status: IN PROGRESS  3.  Pt will be able to functional actions such as sit for 2 hours without having severe urge to void upon standing  Baseline: 1.5 hours up from 60 min Goal status: IN PROGRESS  4.  Pt will be able to sustain pelvic floor contraction for at least 10 sec hold for improved bladder control when walking Baseline:  Goal status: IN PROGRESS  5.  Pt will be ind with urge techniques for reduced severity of urgency when walking to the bathroom  Baseline:  Goal status: MET    PLAN: PT FREQUENCY: 1x/week  PT DURATION: 12 weeks  PLANNED INTERVENTIONS: Therapeutic exercises, Therapeutic activity, Neuromuscular re-education, Balance training, Gait training,  Patient/Family education, Self Care, Joint mobilization, Dry Needling, Electrical stimulation, Cryotherapy, Moist heat, Taping, Biofeedback, Manual therapy, and Re-evaluation  PLAN FOR NEXT SESSION: f/u on standing step outs and kegels with ball  squeeze; continue postural strength, down dog on chair   [employer], PT 09/14/2022, 5:32 PM  PHYSICAL THERAPY DISCHARGE SUMMARY  Visits from Start of Care: 5  Current functional level related to goals / functional outcomes: See above goals   Remaining deficits: See above   Education / Equipment: HEP   Patient agrees to discharge. Patient goals were partially met. Patient is being discharged due to a change in medical status. Pt broke her arm and had to discontinue this POC.  Gustavus Bryant, PT 10/31/22 3:38 PM

## 2022-09-25 DIAGNOSIS — M4134 Thoracogenic scoliosis, thoracic region: Secondary | ICD-10-CM | POA: Diagnosis not present

## 2022-09-25 DIAGNOSIS — M5412 Radiculopathy, cervical region: Secondary | ICD-10-CM | POA: Diagnosis not present

## 2022-09-27 DIAGNOSIS — Z1231 Encounter for screening mammogram for malignant neoplasm of breast: Secondary | ICD-10-CM | POA: Diagnosis not present

## 2022-09-28 ENCOUNTER — Encounter: Payer: Self-pay | Admitting: Obstetrics & Gynecology

## 2022-09-30 DIAGNOSIS — M5412 Radiculopathy, cervical region: Secondary | ICD-10-CM | POA: Diagnosis not present

## 2022-10-02 ENCOUNTER — Other Ambulatory Visit: Payer: Self-pay

## 2022-10-02 ENCOUNTER — Emergency Department (HOSPITAL_BASED_OUTPATIENT_CLINIC_OR_DEPARTMENT_OTHER)
Admission: EM | Admit: 2022-10-02 | Discharge: 2022-10-02 | Disposition: A | Discharge status: Home / Self Care | Payer: Medicare PPO | Attending: Emergency Medicine | Admitting: Emergency Medicine

## 2022-10-02 ENCOUNTER — Encounter (HOSPITAL_BASED_OUTPATIENT_CLINIC_OR_DEPARTMENT_OTHER): Payer: Self-pay | Admitting: Urology

## 2022-10-02 ENCOUNTER — Emergency Department (HOSPITAL_BASED_OUTPATIENT_CLINIC_OR_DEPARTMENT_OTHER): Payer: Medicare PPO

## 2022-10-02 DIAGNOSIS — M50322 Other cervical disc degeneration at C5-C6 level: Secondary | ICD-10-CM | POA: Diagnosis not present

## 2022-10-02 DIAGNOSIS — S0990XA Unspecified injury of head, initial encounter: Secondary | ICD-10-CM | POA: Diagnosis not present

## 2022-10-02 DIAGNOSIS — W19XXXA Unspecified fall, initial encounter: Secondary | ICD-10-CM | POA: Insufficient documentation

## 2022-10-02 DIAGNOSIS — M542 Cervicalgia: Secondary | ICD-10-CM | POA: Diagnosis not present

## 2022-10-02 DIAGNOSIS — M7989 Other specified soft tissue disorders: Secondary | ICD-10-CM | POA: Diagnosis not present

## 2022-10-02 DIAGNOSIS — S52501A Unspecified fracture of the lower end of right radius, initial encounter for closed fracture: Secondary | ICD-10-CM | POA: Diagnosis not present

## 2022-10-02 DIAGNOSIS — M79601 Pain in right arm: Secondary | ICD-10-CM | POA: Diagnosis not present

## 2022-10-02 DIAGNOSIS — S0081XA Abrasion of other part of head, initial encounter: Secondary | ICD-10-CM | POA: Diagnosis not present

## 2022-10-02 DIAGNOSIS — R519 Headache, unspecified: Secondary | ICD-10-CM | POA: Diagnosis not present

## 2022-10-02 DIAGNOSIS — M50321 Other cervical disc degeneration at C4-C5 level: Secondary | ICD-10-CM | POA: Diagnosis not present

## 2022-10-02 DIAGNOSIS — S4991XA Unspecified injury of right shoulder and upper arm, initial encounter: Secondary | ICD-10-CM | POA: Diagnosis present

## 2022-10-02 NOTE — ED Notes (Signed)
Dc instructions reviewed with pt no questions or concerns at this time. Pt to follow up with ortho tomorrow to schedule appt. Pt declined wheelchair and ambulated with steady gait out of ED.

## 2022-10-02 NOTE — ED Triage Notes (Signed)
Pt states tripped in driveway, right forearm pain and elbow pain  Abrasion to right side of forehead Denies any LOC  Not on blood thinners

## 2022-10-02 NOTE — ED Provider Notes (Signed)
[facility] EMERGENCY DEPARTMENT Provider Note   CSN: [id number] Arrival date & time: 10/02/22  1956     History  Chief Complaint  Patient presents with   Cynthia Thornton is a 76 y.o. female.  Patient presents to the emergency department for evaluation of head injury and right arm pain.  Approximately 5:45 PM today, patient was rushing down her driveway to get her neighbors attention when she fell.  She hit the front of her forehead on the right side and sustained an abrasion.  She also reports left-sided neck pain.  No loss of consciousness or vomiting.  She is not anticoagulated.  She fell onto an outstretched right arm.  She has pain in her wrist and forearm which radiates to the elbow.  No treatments prior to arrival.  No hip pain or knee pain.       Home Medications Prior to Admission medications   Medication Sig Start Date End Date Taking? Authorizing Provider  B Complex-C (B-COMPLEX WITH VITAMIN C) tablet Take 1 tablet by mouth daily.    [provider]  cephALEXin (KEFLEX) 500 MG capsule Take 1 capsule (500 mg total) by mouth 2 (two) times daily. 08/30/22   Jaynee Eagles, PA-C  cetirizine (ZYRTEC) 10 MG tablet Take 10 mg by mouth daily.    [provider]  Cholecalciferol (VITAMIN D) 2000 units CAPS Take 2,000 Units by mouth daily.    [provider]  clorazepate (TRANXENE) 7.5 MG tablet Take 1 tablet (7.5 mg total) by mouth at bedtime as needed for anxiety. 06/27/20   Colon Branch, MD  CRANBERRY PO Take 2 tablets by mouth in the morning and at bedtime.    [provider]  diazepam (VALIUM) 2 MG tablet Take 2 mg by mouth every 6 (six) hours as needed. 02/28/22   [provider]  famotidine (PEPCID) 20 MG tablet Take 1 tablet (20 mg total) by mouth daily. 12/29/21   Levin Erp, PA  methenamine (HIPREX) 1 g tablet Take 1 g by mouth 2 (two) times daily with a meal. Take with '500mg'$  of vitamin c w/ each tablet     [provider]  methocarbamol (ROBAXIN) 500 MG tablet Take 500 mg by mouth every 8 (eight) hours as needed. 01/27/22   [provider]  omeprazole (PRILOSEC) 40 MG capsule Take 1 capsule (40 mg total) by mouth daily. 12/29/21   Levin Erp, PA  ondansetron (ZOFRAN-ODT) 4 MG disintegrating tablet Take 1 tablet (4 mg total) by mouth every 8 (eight) hours as needed for nausea or vomiting. 03/10/22   Colon Branch, MD  Probiotic Product (PROBIOTIC PO) Take by mouth.    [provider]  valACYclovir (VALTREX) 500 MG tablet Take 1 tablet (500 mg total) by mouth daily. 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 06/28/22   [name], MD      Allergies    Doxycycline, Other, Oxycodone, Sulfa antibiotics, Trimethoprim sulfate [trimethoprim], Codeine, and Propranolol hcl    Review of Systems   Review of Systems  Physical Exam Updated Vital Signs BP (!) 151/97 (BP Location: Left Arm)   Pulse 80   Temp 97.8 F (36.6 C)   Resp 18   Ht '5\' 2"'$  (1.575 m)   Wt 64 kg   SpO2 100%   BMI 25.81 kg/m   Physical Exam Vitals and nursing note reviewed.  Constitutional:      Appearance: She is well-developed.  HENT:  Head: Normocephalic. No raccoon eyes or Battle's sign.     Comments: Abrasion and hematoma right forehead, no deep laceration.  Wounds appear clean without foreign body.  No active bleeding.    Right Ear: Tympanic membrane, ear canal and external ear normal. No hemotympanum.     Left Ear: Tympanic membrane, ear canal and external ear normal. No hemotympanum.     Nose: Nose normal.     Mouth/Throat:     Mouth: Mucous membranes are moist.     Pharynx: Uvula midline.  Eyes:     General: Lids are normal.     Extraocular Movements:     Right eye: No nystagmus.     Left eye: No nystagmus.     Conjunctiva/sclera: Conjunctivae normal.     Pupils: Pupils are equal, round, and reactive to light.     Comments: No visible hyphema noted  Cardiovascular:     Rate and  Rhythm: Normal rate and regular rhythm.  Pulmonary:     Effort: Pulmonary effort is normal.     Breath sounds: Normal breath sounds.  Abdominal:     Palpations: Abdomen is soft.     Tenderness: There is no abdominal tenderness.  Musculoskeletal:     Cervical back: Normal range of motion and neck supple. Tenderness (Left lower cervical paraspinous, worse with movement) present. No bony tenderness. Normal range of motion.     Thoracic back: No tenderness or bony tenderness.     Lumbar back: No tenderness or bony tenderness.  Skin:    General: Skin is warm and dry.  Neurological:     Mental Status: She is alert and oriented to person, place, and time.     GCS: GCS eye subscore is 4. GCS verbal subscore is 5. GCS motor subscore is 6.     Cranial Nerves: No cranial nerve deficit.     Sensory: No sensory deficit.     Coordination: Coordination normal.     ED Results / Procedures / Treatments   Labs (all labs ordered are listed, but only abnormal results are displayed) Labs Reviewed - No data to display  EKG None  Radiology DG Forearm Right  Result Date: 10/02/2022 CLINICAL DATA:  Fall, right arm pain EXAM: RIGHT FOREARM - 2 VIEW COMPARISON:  None Available. FINDINGS: There is a cortical buckle fracture of the a dorsal cortex of the distal right radius best seen on lateral examination with a small amount associated soft tissue swelling. Normal overall alignment. Mild dorsal tilt of the distal radial articular surface, not well profiled on this examination. No other fracture identified. No dislocation. Degenerative changes are noted within the right wrist and elbow. Soft tissues are otherwise unremarkable. IMPRESSION: 1. Cortical buckle fracture of the dorsal cortex of the distal right radius. Electronically Signed   By: Fidela Salisbury M.D.   On: 10/02/2022 20:39    Procedures Procedures    Medications Ordered in ED Medications - No data to display  ED Course/ Medical Decision  Making/ A&P    Patient seen and examined. History obtained directly from patient. Work-up including labs, imaging, EKG ordered in triage, if performed, were reviewed.    Labs/EKG: None ordered  Imaging: Independently visualized and interpreted.  This included: X-ray of the right forearm, agree with nondisplaced distal right radial fracture.  Added head and cervical spine CT due to head injury.  Medications/Fluids: None ordered.  Patient wants to take her home ibuprofen, advised her to wait until after the head CT  is back.  Most recent vital signs reviewed and are as follows: BP (!) 151/97 (BP Location: Left Arm)   Pulse 80   Temp 97.8 F (36.6 C)   Resp 18   Ht '5\' 2"'$  (1.575 m)   Wt 64 kg   SpO2 100%   BMI 25.81 kg/m   Initial impression: Mechanical fall with nondisplaced right wrist fracture and forehead abrasion.  Awaiting completion of workup.  Patient has not had any signs of neurologic decompensation since the injury.    Reassessment performed. Patient appears stable.  Wrist splint placed, distal CMS intact after placement by nurse tech.  Imaging personally visualized and interpreted including: CT of the head and cervical spine, agree negative.  Reviewed pertinent lab work and imaging with patient at bedside. Questions answered.   Most current vital signs reviewed and are as follows: BP (!) 150/79 (BP Location: Left Arm)   Pulse 75   Temp 97.9 F (36.6 C) (Oral)   Resp 18   Ht '5\' 2"'$  (1.575 m)   Wt 64 kg   SpO2 100%   BMI 25.81 kg/m   Plan: Discharge to home.   Prescriptions written for: None  Other home care instructions discussed: RICE protocol, OTC meds for pain  ED return instructions discussed: Patient was counseled on head injury precautions and symptoms that should indicate their return to the ED.  These include severe worsening headache, vision changes, confusion, loss of consciousness, trouble walking, nausea & vomiting, or weakness/tingling in  extremities.    Follow-up instructions discussed: Patient encouraged to follow-up with their PCP in 3 days with any persisting concussion type symptoms.  Encouraged follow-up with orthopedist in the next 1 week.  She is established with [facility].  Orthopedic hand referral given as well.                          Medical Decision Making Amount and/or Complexity of Data Reviewed Radiology: ordered.   Patient with mechanical fall with minor head injury as well as right wrist fracture.  Distal CMS intact.  Head imaging and C-spine imaging is negative.  Low concern for concussion at this time.  Patient without infectious signs and symptoms.   The patient's vital signs, pertinent lab work and imaging were reviewed and interpreted as discussed in the ED course. Hospitalization was considered for further testing, treatments, or serial exams/observation. However as patient is well-appearing, has a stable exam, and reassuring studies today, I do not feel that they warrant admission at this time. This plan was discussed with the patient who verbalizes agreement and comfort with this plan and seems reliable and able to return to the Emergency Department with worsening or changing symptoms.          Final Clinical Impression(s) / ED Diagnoses Final diagnoses:  Closed fracture of distal end of right radius, unspecified fracture morphology, initial encounter  Abrasion of forehead, initial encounter  Neck pain  Fall, initial encounter    Rx / DC Orders ED Discharge Orders     None         Carlisle Cater, Hershal Coria 10/02/22 2324    Regan Lemming, MD 10/02/22 2344

## 2022-10-02 NOTE — Discharge Instructions (Signed)
Please read and follow all provided instructions.  Your diagnoses today include:  1. Closed fracture of distal end of right radius, unspecified fracture morphology, initial encounter   2. Abrasion of forehead, initial encounter   3. Neck pain   4. Fall, initial encounter     Tests performed today include: CT scan of your head and cervical spine that did not show any serious injury. X-ray of the wrist shows a nondisplaced distal radius fracture Vital signs. See below for your results today.   Medications prescribed:  Please use over-the-counter NSAID medications (ibuprofen, naproxen) or Tylenol (acetaminophen) as directed on the packaging for pain -- as long as you do not have any reasons avoid these medications. Reasons to avoid NSAID medications include: weak kidneys, a history of bleeding in your stomach or gut, or uncontrolled high blood pressure or previous heart attack. Reasons to avoid Tylenol include: liver problems or ongoing alcohol use. Never take more than '4000mg'$  or 8 Extra strength Tylenol in a 24 hour period.     Take any prescribed medications only as directed.  Home care instructions:  Follow any educational materials contained in this packet.  BE VERY CAREFUL not to take multiple medicines containing Tylenol (also called acetaminophen). Doing so can lead to an overdose which can damage your liver and cause liver failure and possibly death.   Follow-up instructions: Please follow-up with the orthopedic physician referral in the next 5 days for follow-up.  Return instructions:  SEEK IMMEDIATE MEDICAL ATTENTION IF: There is confusion or drowsiness (although children frequently become drowsy after injury).  You cannot awaken the injured person.  You have more than one episode of vomiting.  You notice dizziness or unsteadiness which is getting worse, or inability to walk.  You have convulsions or unconsciousness.  You experience severe, persistent headaches not relieved  by Tylenol. You cannot use arms or legs normally.  There are changes in pupil sizes. (This is the black center in the colored part of the eye)  There is clear or bloody discharge from the nose or ears.  You have change in speech, vision, swallowing, or understanding.  Localized weakness, numbness, tingling, or change in bowel or bladder control. You have any other emergent concerns.  Additional Information: You have had a head injury which does not appear to require admission at this time.  Your vital signs today were: BP (!) 150/79 (BP Location: Left Arm)   Pulse 75   Temp 97.8 F (36.6 C)   Resp 18   Ht '5\' 2"'$  (1.575 m)   Wt 64 kg   SpO2 100%   BMI 25.81 kg/m  If your blood pressure (BP) was elevated above 135/85 this visit, please have this repeated by your doctor within one month. --------------

## 2022-10-03 DIAGNOSIS — M25531 Pain in right wrist: Secondary | ICD-10-CM | POA: Diagnosis not present

## 2022-10-03 DIAGNOSIS — S52501D Unspecified fracture of the lower end of right radius, subsequent encounter for closed fracture with routine healing: Secondary | ICD-10-CM | POA: Diagnosis not present

## 2022-10-05 DIAGNOSIS — R92332 Mammographic heterogeneous density, left breast: Secondary | ICD-10-CM | POA: Diagnosis not present

## 2022-10-05 DIAGNOSIS — N6489 Other specified disorders of breast: Secondary | ICD-10-CM | POA: Diagnosis not present

## 2022-10-06 ENCOUNTER — Encounter: Payer: Self-pay | Admitting: Obstetrics & Gynecology

## 2022-10-09 DIAGNOSIS — C50312 Malignant neoplasm of lower-inner quadrant of left female breast: Secondary | ICD-10-CM | POA: Diagnosis not present

## 2022-10-09 DIAGNOSIS — E871 Hypo-osmolality and hyponatremia: Secondary | ICD-10-CM | POA: Diagnosis not present

## 2022-10-09 DIAGNOSIS — F419 Anxiety disorder, unspecified: Secondary | ICD-10-CM | POA: Diagnosis not present

## 2022-10-09 DIAGNOSIS — M1990 Unspecified osteoarthritis, unspecified site: Secondary | ICD-10-CM | POA: Diagnosis not present

## 2022-10-09 DIAGNOSIS — N390 Urinary tract infection, site not specified: Secondary | ICD-10-CM | POA: Diagnosis not present

## 2022-10-09 DIAGNOSIS — Z170 Estrogen receptor positive status [ER+]: Secondary | ICD-10-CM | POA: Diagnosis not present

## 2022-10-09 DIAGNOSIS — E785 Hyperlipidemia, unspecified: Secondary | ICD-10-CM | POA: Diagnosis not present

## 2022-10-09 DIAGNOSIS — K219 Gastro-esophageal reflux disease without esophagitis: Secondary | ICD-10-CM | POA: Diagnosis not present

## 2022-10-09 DIAGNOSIS — N1831 Chronic kidney disease, stage 3a: Secondary | ICD-10-CM | POA: Diagnosis not present

## 2022-10-09 LAB — BASIC METABOLIC PANEL
BUN: 16 (ref 4–21)
CO2: 21 (ref 13–22)
Chloride: 101 (ref 99–108)
Creatinine: 1 (ref 0.5–1.1)
Glucose: 90
Potassium: 4.5 mEq/L (ref 3.5–5.1)
Sodium: 136 — AB (ref 137–147)

## 2022-10-09 LAB — COMPREHENSIVE METABOLIC PANEL
Calcium: 9.5 (ref 8.7–10.7)
eGFR: 61

## 2022-10-09 LAB — TSH: TSH: 4.51 (ref 0.41–5.90)

## 2022-10-16 DIAGNOSIS — M5412 Radiculopathy, cervical region: Secondary | ICD-10-CM | POA: Diagnosis not present

## 2022-10-16 DIAGNOSIS — M542 Cervicalgia: Secondary | ICD-10-CM | POA: Diagnosis not present

## 2022-10-18 ENCOUNTER — Encounter: Payer: Self-pay | Admitting: Internal Medicine

## 2022-10-19 DIAGNOSIS — S52501A Unspecified fracture of the lower end of right radius, initial encounter for closed fracture: Secondary | ICD-10-CM | POA: Diagnosis not present

## 2022-10-27 DIAGNOSIS — M4125 Other idiopathic scoliosis, thoracolumbar region: Secondary | ICD-10-CM | POA: Diagnosis not present

## 2022-10-27 DIAGNOSIS — M4312 Spondylolisthesis, cervical region: Secondary | ICD-10-CM | POA: Diagnosis not present

## 2022-10-27 DIAGNOSIS — M5459 Other low back pain: Secondary | ICD-10-CM | POA: Diagnosis not present

## 2022-11-02 ENCOUNTER — Ambulatory Visit: Payer: Medicare PPO | Admitting: Physical Therapy

## 2022-11-09 ENCOUNTER — Encounter: Payer: Self-pay | Admitting: Physical Therapy

## 2022-11-10 ENCOUNTER — Encounter: Payer: Self-pay | Admitting: Family

## 2022-11-10 ENCOUNTER — Ambulatory Visit: Payer: Medicare PPO | Admitting: Family

## 2022-11-10 VITALS — BP 124/76 | HR 70 | Ht 62.0 in | Wt 146.2 lb

## 2022-11-10 DIAGNOSIS — Z87440 Personal history of urinary (tract) infections: Secondary | ICD-10-CM | POA: Diagnosis not present

## 2022-11-10 DIAGNOSIS — R300 Dysuria: Secondary | ICD-10-CM | POA: Diagnosis not present

## 2022-11-10 LAB — POC URINALSYSI DIPSTICK (AUTOMATED)
Bilirubin, UA: NEGATIVE
Glucose, UA: NEGATIVE
Ketones, UA: NEGATIVE
Nitrite, UA: NEGATIVE
Protein, UA: NEGATIVE
Spec Grav, UA: <=1.005 — AB (ref 1.010–1.025)
Urobilinogen, UA: 0.2 E.U./dL
pH, UA: 6 (ref 5.0–8.0)

## 2022-11-10 MED ORDER — CEPHALEXIN 500 MG PO CAPS: 500.0000 mg | ORAL_CAPSULE | Freq: Two times a day (BID) | ORAL | 0 refills | Status: DC

## 2022-11-10 NOTE — Progress Notes (Unsigned)
11/13/2022 Cynthia Thornton [id number] 06-Jan-1946  Referring provider: Colon Branch, MD Primary GI doctor: Dr. Hilarie Fredrickson  ASSESSMENT AND PLAN:   Diarrhea/change in bowel habits x 2 weeks No accompaniments, no alarm symptoms, frequent ABX and currently on keflex - stool samples to rule out infection with ABX use   -  -ESR to rule out inflammation/IBD  -TTG/IGA to evaluate for celiac disease.  -does not sound infectious but with ABX use and acuity will rule out Cdiff and Gi pathogen.  - With her frequent ABX use, flora disruption is a possibility, add on Allign probiotic for at least a month, FODMAP discussed.  Consider SIBO testing or xifaxin trial pending results Patient due for colonoscopy 04/2024, if continues can consider colonoscopy to rule out microscopic colitis.  Follow up 2-3 months.   Benign neoplasm of colon, unspecified part of colon with family history of colon cancer 05/29/2019 colonoscopy with small internal hemorrhoids and otherwise normal.  Repeat recommended in 5 years given family history.  Gastroesophageal reflux disease without esophagitis Continue PPI daily No symptoms at this time  Patient Care Team: Colon Branch, MD as PCP - General Gaynelle Arabian, MD as Consulting Physician (Orthopedic Surgery) Nicholas Lose, MD as Consulting Physician (Hematology and Oncology) [name], MD as Consulting Physician (General Surgery) Brand Males, MD as Consulting Physician (Pulmonary Disease) Kyung Rudd, MD as Consulting Physician (Radiation Oncology) Delice Bison, Charlestine Massed, NP as Nurse Practitioner (Hematology and Oncology) Ardis Hughs, MD as Attending Physician (Urology) Melida Quitter, MD as Consulting Physician (Otolaryngology) Patrici Ranks. (Inactive) (Cardiology) [name], MD as Consulting Physician (Obstetrics and Gynecology)  HISTORY OF PRESENT ILLNESS: 77 y.o. female with a past medical history of hiatal hernia with GERD, history  of esophageal stricture, personal history of colon polyps with family history of colon cancer in her father and uncle in 64s, bronchiectasis, anemia, left breast carcinoma 2018 and others listed below presents for evaluation of dark watery stools.   09/18/2012 EGD was normal. 05/29/2019 colonoscopy with small internal hemorrhoids and otherwise normal.  Repeat recommended in 5 years given family history. 12/29/2021 office visit with Ellouise Newer for refill of PPI for GERD, on prilosec daily 40 mg since 2004.   She has BM 30 mins of getting up normally, mushy 2-3 times in a row due to plant based diet since having breast cancer but 2 weeks ago today it has been just water.  She had spent time at her sisters so assumed it was something she ate however it did not go away.  Will have just water 2-3 x a in the AM with small amounts of stool and what she states it black/brown pieces from her Kale smoothie in the morning which she has daily, if she stops the smoothie her stool is more brown.  No nocturnal symptoms, rare other episode later in the day.  She denies AB pain.  Denies nausea, vomiting, fever, chills.  Denies increase gas/bloating.  No unintentional weight loss, no appetite changes.  She denies fecal urgency, fecal incontinence.  Denies close contacts ill, recent camping, recent travel. Patient reports antibiotic use every 3 months for UTI, last one was Nov, and then she is on ABX right now for UTI with Ecoli, on Keflex for 4 days.  Patient  denies  new medications.    CBC  03/10/2022  HGB 12.2 MCV 90.9 without evidence of anemia WBC 5.0 Platelets 270.0 Anemia panel 01/07/2021  Iron 59 Ferritin 16.8  04/25/2020  B12 205- on a B12 Kidney function 10/09/2022  BUN 16 Cr 1.0  GFR >60  Potassium 4.5   LFTs 03/10/2022  AST 19 ALT 14 Alkphos 86 TBili 0.5 05/18/2020 LIPASE 22 10/09/2022 TSH 4.51  She  reports that she quit smoking about 27 years ago. Her smoking use included cigarettes.  She has a 3.00 pack-year smoking history. She has never used smokeless tobacco. She reports current alcohol use. She reports that she does not use drugs.  Current Medications:     Current Outpatient Medications (Respiratory):    cetirizine (ZYRTEC) 10 MG tablet, Take 10 mg by mouth daily.    Current Outpatient Medications (Other):    B Complex-C (B-COMPLEX WITH VITAMIN C) tablet, Take 1 tablet by mouth daily.   cephALEXin (KEFLEX) 500 MG capsule, Take 1 capsule (500 mg total) by mouth 2 (two) times daily.   Cholecalciferol (VITAMIN D) 2000 units CAPS, Take 2,000 Units by mouth daily.   clorazepate (TRANXENE) 7.5 MG tablet, Take 1 tablet (7.5 mg total) by mouth at bedtime as needed for anxiety.   CRANBERRY PO, Take 2 tablets by mouth in the morning and at bedtime.   diazepam (VALIUM) 2 MG tablet, Take 2 mg by mouth every 6 (six) hours as needed.   famotidine (PEPCID) 20 MG tablet, Take 1 tablet (20 mg total) by mouth daily.   methenamine (HIPREX) 1 g tablet, Take 1 g by mouth 2 (two) times daily with a meal. Take with '500mg'$  of vitamin c w/ each tablet   methocarbamol (ROBAXIN) 500 MG tablet, Take 500 mg by mouth every 8 (eight) hours as needed.   omeprazole (PRILOSEC) 40 MG capsule, Take 1 capsule (40 mg total) by mouth daily.   ondansetron (ZOFRAN-ODT) 4 MG disintegrating tablet, Take 1 tablet (4 mg total) by mouth every 8 (eight) hours as needed for nausea or vomiting.   Probiotic Product (PROBIOTIC PO), Take by mouth.   valACYclovir (VALTREX) 500 MG tablet, Take 1 tablet (500 mg total) by mouth daily. 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  Medical History:  Past Medical History:  Diagnosis Date   Arthritis    Breast cancer, left [facility]) oncologist-- dr [name]   dx 09-26-2017--- IDC, Stage IA, Grade 2, ER/PR +;  HER2 negative;  s/p  breast lumpectomy w/ node dissection 11-02-2017;  completed radiation 01-09-2018 (left breast genetic screening panel 2017 with variant of unknown significance AXINA)    COPD (chronic obstructive pulmonary disease) ([place])    pulmologist-- dr Dillard Essex-- w/ chronic lingular scarring  (last exceratbation bronchiectasis 07/ 2020)   Dyslipidemia    followed by cardiology/ vascular-- dr g. adams ([facility] in [place])  family history strokes   Eczema    Family history of breast cancer    Family history of colon cancer    GERD (gastroesophageal reflux disease)    Hiatal hernia    History of esophageal stricture    s/p dilatation 2008   History of external beam radiation therapy    left breast 12-19-2017  to 01-09-2018   History of hyperthyroidism    endocrinologist-- (lov 04-11-2019 epic) dr Loanne Drilling, dx 2009 due to multinodular goiter ,  had taken medication for few months 2010 stopped due to norma TFT;     Insomnia    Multinodular goiter last thyroid ultrasound in epic 12-18-2018   endocrinologist-- dr Loanne Drilling,  dx 2009 w/ hyperthroidism with medication until normal TFT in 2010;     Osteopenia 02/2018   T score -2.4  distal third of radius.  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   Wears contact lenses    Allergies:  Allergies  Allergen Reactions   Doxycycline Rash   Other Other (See Comments)    Surgical glue : hives   Oxycodone Nausea And Vomiting   Sulfa Antibiotics Hives   Trimethoprim Sulfate [Trimethoprim]     rash   Codeine Nausea Only   Propranolol Hcl Other (See Comments)    "Dizziness and heart racing"     Surgical History:  She  has a past surgical history that includes Video bronchoscopy (Bilateral, 03/16/2015); Breast lumpectomy with radioactive seed and sentinel lymph node biopsy  (Left, 11/02/2017); Breast surgery (2000); Vaginal hysterectomy (1991); Tonsillectomy (age 82); Tubal ligation (Bilateral, yrs ago); and Laparoscopic bilateral salpingo oophorectomy (Bilateral, 10/13/2019). Family History:  Her family history includes Breast cancer in her cousin, maternal aunt, and sister; Colon cancer in her cousin; Colon cancer (age of onset: 69) in her paternal uncle; Colon cancer (age of onset: 51) in her father; Crohn's disease in her son; Diabetes in her father; Hypertension in her father; Leukemia in her maternal grandfather; Lung cancer in her mother; Osteoporosis in her father and mother; Ovarian cancer (age of onset: 38) in her maternal aunt; Stroke in her mother.  REVIEW OF SYSTEMS  : All other systems reviewed and negative except where noted in the History of Present Illness.  PHYSICAL EXAM: BP 120/80   Pulse 76   Ht '5\' 2"'$  (1.575 m)   Wt 146 lb 2 oz (66.3 kg)   BMI 26.73 kg/m  General:   Pleasant, well developed female in no acute distress Head:   Normocephalic and atraumatic. Eyes:  sclerae anicteric,conjunctive pink  Heart:   regular rate and rhythm Pulm:  Clear anteriorly; no wheezing Abdomen:   Soft, Non-distended AB, Active bowel sounds. No tenderness . Without guarding and Without rebound, No organomegaly appreciated. Rectal: Not evaluated Extremities:  Without edema. Msk: Symmetrical without gross deformities. Peripheral pulses intact.  Neurologic:  Alert and  oriented x4;  No focal deficits.  Skin:   Dry and intact without significant lesions or rashes. Psychiatric:  Cooperative. Normal mood and affect.  RELEVANT LABS AND IMAGING: CBC    Component Value Date/Time   WBC 5.0 03/10/2022 0831   RBC 3.85 (L) 03/10/2022 0831   HGB 12.2 03/10/2022 0831   HGB 13.0 10/03/2017 0837   HCT 35.0 (L) 03/10/2022 0831   HCT 37.6 10/03/2017 0837   PLT 270.0 03/10/2022 0831   PLT 305 10/03/2017 0837   MCV 90.9 03/10/2022 0831   MCV 88.3 10/03/2017 0837   MCH  30.2 08/13/2020 0957   MCHC 35.0 03/10/2022 0831   RDW 13.2 03/10/2022 0831   RDW 13.4 10/03/2017 0837   LYMPHSABS 1.6 03/10/2022 0831   LYMPHSABS 1.5 10/03/2017 0837   MONOABS 0.5 03/10/2022 0831   MONOABS 0.5 10/03/2017 0837   EOSABS 0.1 03/10/2022 0831   EOSABS 0.2 10/03/2017 0837   BASOSABS 0.1 03/10/2022 0831   BASOSABS 0.1 10/03/2017 0837    CMP     Component Value Date/Time   NA  136 (A) 10/09/2022 0000   NA 134 (L) 10/03/2017 0837   K 4.5 10/09/2022 0000   K 4.3 10/03/2017 0837   CL 101 10/09/2022 0000   CO2 21 10/09/2022 0000   CO2 23 10/03/2017 0837   GLUCOSE 87 07/25/2022 1523   GLUCOSE 90 10/03/2017 0837   BUN 16 10/09/2022 0000   BUN 24.3 10/03/2017 0837   CREATININE 1.0 10/09/2022 0000   CREATININE 1.02 07/25/2022 1523   CREATININE 0.93 08/13/2020 0957   CREATININE 1.0 10/03/2017 0837   CALCIUM 9.5 10/09/2022 0000   CALCIUM 9.7 10/03/2017 0837   PROT 6.6 03/10/2022 0831   PROT 7.7 10/03/2017 0837   ALBUMIN 4.6 03/10/2022 0831   ALBUMIN 4.7 10/03/2017 0837   AST 19 03/10/2022 0831   AST 21 10/03/2017 0837   ALT 14 03/10/2022 0831   ALT 17 10/03/2017 0837   ALKPHOS 86 03/10/2022 0831   ALKPHOS 107 10/03/2017 0837   BILITOT 0.5 03/10/2022 0831   BILITOT 0.44 10/03/2017 0837   GFRNONAA >60 05/18/2020 1147   GFRAA >60 05/18/2020 [place] Georjean Toya, PA-C 9:34 AM

## 2022-11-10 NOTE — Addendum Note (Signed)
Addended by: Sena Hitch on: 11/10/2022 09:32 AM   Modules accepted: Orders

## 2022-11-10 NOTE — Progress Notes (Signed)
Cynthia Thornton is a 77 y.o. female with the following history as recorded in EpicCare:  Patient Active Problem List   Diagnosis Date Noted   Irritation of left eye 02/07/2022   Tinnitus 06/27/2019   Anxiety 03/29/2019   Insomnia 03/29/2019   Malignant neoplasm of lower-inner quadrant of left breast in female, estrogen receptor positive ([place]) 10/02/2017   Genetic testing 03/03/2016   Family history of breast cancer    Annual physical exam 07/27/2015   PCP NOTES >>>>>>> 07/27/2015   Smoking history 01/15/2015   Family hx of colon cancer 09/10/2014   Change in bowel habits 09/10/2014   Bronchiectasis ([place]) 07/28/2012   Osteopenia    Hypercholesteremia 10/03/2010   GOITER, MULTINODULAR 11/27/2008   UNSPECIFIED ANEMIA 11/02/2008   Thyrotoxicosis 10/22/2008   COLONIC POLYPS, BENIGN 01/16/2008   DEGENERATIVE JOINT DISEASE 01/16/2008   ESOPHAGEAL STRICTURE 04/05/2007   HIATAL HERNIA 08/15/2006   GERD 07/30/2006    Current Outpatient Medications  Medication Sig Dispense Refill   B Complex-C (B-COMPLEX WITH VITAMIN C) tablet Take 1 tablet by mouth daily.     cetirizine (ZYRTEC) 10 MG tablet Take 10 mg by mouth daily.     Cholecalciferol (VITAMIN D) 2000 units CAPS Take 2,000 Units by mouth daily.     clorazepate (TRANXENE) 7.5 MG tablet Take 1 tablet (7.5 mg total) by mouth at bedtime as needed for anxiety. 30 tablet 2   CRANBERRY PO Take 2 tablets by mouth in the morning and at bedtime.     diazepam (VALIUM) 2 MG tablet Take 2 mg by mouth every 6 (six) hours as needed.     famotidine (PEPCID) 20 MG tablet Take 1 tablet (20 mg total) by mouth daily. 90 tablet 3   methenamine (HIPREX) 1 g tablet Take 1 g by mouth 2 (two) times daily with a meal. Take with '500mg'$  of vitamin c w/ each tablet     methocarbamol (ROBAXIN) 500 MG tablet Take 500 mg by mouth every 8 (eight) hours as needed.     omeprazole (PRILOSEC) 40 MG capsule Take 1 capsule (40 mg total) by mouth daily. 90 capsule 3    ondansetron (ZOFRAN-ODT) 4 MG disintegrating tablet Take 1 tablet (4 mg total) by mouth every 8 (eight) hours as needed for nausea or vomiting. 30 tablet 0   Probiotic Product (PROBIOTIC PO) Take by mouth.     valACYclovir (VALTREX) 500 MG tablet Take 1 tablet (500 mg total) by mouth daily. 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 90 tablet 4   cephALEXin (KEFLEX) 500 MG capsule Take 1 capsule (500 mg total) by mouth 2 (two) times daily. 10 capsule 0   No current facility-administered medications for this visit.    Allergies: Doxycycline, Other, Oxycodone, Sulfa antibiotics, Trimethoprim sulfate [trimethoprim], Codeine, and Propranolol hcl  Past Medical History:  Diagnosis Date   Arthritis    Breast cancer, left [facility]) oncologist-- dr [name]   dx 09-26-2017--- IDC, Stage IA, Grade 2, ER/PR +;  HER2 negative;  s/p  breast lumpectomy w/ node dissection 11-02-2017;  completed radiation 01-09-2018 (left breast genetic screening panel 2017 with variant of unknown significance AXINA)   COPD (chronic obstructive pulmonary disease) ([place])    pulmologist-- dr Dillard Essex-- w/ chronic lingular scarring  (last exceratbation bronchiectasis 07/ 2020)   Dyslipidemia    followed by cardiology/ vascular-- dr g. adams ([facility] in [place])  family history strokes   Eczema    Family history of breast cancer  Family history of colon cancer    GERD (gastroesophageal reflux disease)    Hiatal hernia    History of esophageal stricture    s/p dilatation 2008   History of external beam radiation therapy    left breast 12-19-2017  to 01-09-2018   History of hyperthyroidism    endocrinologist-- (lov 04-11-2019 epic) dr Loanne Drilling, dx 2009 due to multinodular goiter ,  had taken medication for few months 2010 stopped due to norma TFT;     Insomnia    Multinodular goiter last thyroid ultrasound in epic 12-18-2018   endocrinologist-- dr Loanne Drilling,  dx 2009 w/ hyperthroidism with medication until normal TFT in 2010;      Osteopenia 02/2018   T score -2.4 distal third of radius.  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   Wears contact lenses     Past Surgical History:  Procedure Laterality Date   BREAST LUMPECTOMY WITH RADIOACTIVE SEED AND SENTINEL LYMPH NODE BIOPSY Left 11/02/2017   Procedure: LEFT BREAST LUMPECTOMY WITH RADIOACTIVE SEED AND LEFT AXILLARY DEEP SENTINEL LYMPH NODE BIOPSY, INJECT BLUE DYE LEFT BREAST;  Surgeon: [name], MD;  Location: [place];  Service: General;  Laterality: Left;   BREAST SURGERY  2000   Breast cyst removed, left   LAPAROSCOPIC BILATERAL SALPINGO OOPHERECTOMY Bilateral 10/13/2019   Procedure: LAPAROSCOPIC BILATERAL SALPINGO OOPHORECTOMY LYSIS OF ADHESIONS PERITONEAL WASHINGS ;  Surgeon: [name], MD;  Location: [place];  Service: Gynecology;  Laterality: Bilateral;  request 7:30am OR time in [place] block requests one hour   TONSILLECTOMY  age 24   TUBAL LIGATION Bilateral yrs ago   VAGINAL HYSTERECTOMY  1991   Right ovarian cystectomy   VIDEO BRONCHOSCOPY Bilateral 03/16/2015   Procedure: VIDEO BRONCHOSCOPY WITHOUT FLUORO;  Surgeon: Brand Males, MD;  Location: [facility] END[facility]COPY;  Service: Endoscopy;  Laterality: Bilateral;    Family History  Problem Relation Age of Onset   Osteoporosis Mother    Lung cancer Mother    Stroke Mother    Diabetes Father    Hypertension Father    Osteoporosis Father    Colon cancer Father 69   Breast cancer Sister        Age 60   Leukemia Maternal Grandfather    Crohn's disease Son    Ovarian cancer Maternal Aunt 86   Breast cancer  Maternal Aunt        great aunt- Age unknown   Colon cancer Paternal Uncle 37   Breast cancer Cousin        maternal-Age 3   Colon cancer Cousin        maternal first cousin    Social History   Tobacco Use   Smoking status: Former    Packs/day: 0.10    Years: 30.00    Total pack years: 3.00    Types: Cigarettes    Quit date: 10/31/1995    Years since quitting: 27.0   Smokeless tobacco: Never  Substance Use Topics   Alcohol use: Yes    Comment: occ    Subjective:   Presents with concerns for UTI;  burning on urination; symptoms x 3 days; no blood noted in urine; no fever; is prone to recurrent UTIs- per patient, suspect vaginal source; had done pelvic floor therapy last year with the hopes of limiting symptoms;  Is scheduled to see GI next week due to concerns for changes in bowel habits;    Objective:  Vitals:   11/10/22 0900  BP: 124/76  Pulse: 70  SpO2: 96%  Weight: 146 lb 3.2 oz (66.3 kg)  Height: '5\' 2"'$  (1.575 m)    General: Well developed, well nourished, in no acute distress  Skin : Warm and dry.  Head: Normocephalic and atraumatic  Lungs: Respirations unlabored; clear to auscultation bilaterally without wheeze, rales, rhonchi  CVS exam: normal rate and regular rhythm.  Neurologic: Alert and oriented; speech intact; face symmetrical; moves all extremities well; CNII-XII intact without focal deficit   Assessment:  1. Dysuria   2. History of recurrent UTIs     Plan:  Check U/A and urine culture; Rx for Keflex- take as directed; refer to urogynecology; follow up with PCP for further concerns;   No follow-ups on file.  Orders Placed This Encounter  Procedures   Ambulatory referral to Urogynecology    Referral Priority:   Routine    Referral Type:   Consultation    Referral Reason:   Specialty Services Required    Requested Specialty:   Urology    Number of Visits Requested:   1   POCT Urinalysis Dipstick (Automated)    Requested Prescriptions   Signed  Prescriptions Disp Refills   cephALEXin (KEFLEX) 500 MG capsule 10 capsule 0    Sig: Take 1 capsule (500 mg total) by mouth 2 (two) times daily.

## 2022-11-12 LAB — URINE CULTURE
MICRO NUMBER:: 14424669
SPECIMEN QUALITY:: ADEQUATE

## 2022-11-13 ENCOUNTER — Encounter: Payer: Self-pay | Admitting: Physician Assistant

## 2022-11-13 ENCOUNTER — Ambulatory Visit: Payer: Medicare PPO | Admitting: Physician Assistant

## 2022-11-13 ENCOUNTER — Other Ambulatory Visit (INDEPENDENT_AMBULATORY_CARE_PROVIDER_SITE_OTHER): Payer: Medicare PPO

## 2022-11-13 VITALS — BP 120/80 | HR 76 | Ht 62.0 in | Wt 146.1 lb

## 2022-11-13 DIAGNOSIS — D126 Benign neoplasm of colon, unspecified: Secondary | ICD-10-CM | POA: Diagnosis not present

## 2022-11-13 DIAGNOSIS — K219 Gastro-esophageal reflux disease without esophagitis: Secondary | ICD-10-CM

## 2022-11-13 DIAGNOSIS — R197 Diarrhea, unspecified: Secondary | ICD-10-CM

## 2022-11-13 DIAGNOSIS — R194 Change in bowel habit: Secondary | ICD-10-CM

## 2022-11-13 LAB — CBC WITH DIFFERENTIAL/PLATELET
Basophils Absolute: 0.1 10*3/uL (ref 0.0–0.1)
Basophils Relative: 1 % (ref 0.0–3.0)
Eosinophils Absolute: 0.1 10*3/uL (ref 0.0–0.7)
Eosinophils Relative: 2.7 % (ref 0.0–5.0)
HCT: 36.7 % (ref 36.0–46.0)
Hemoglobin: 13.1 g/dL (ref 12.0–15.0)
Lymphocytes Relative: 19.5 % (ref 12.0–46.0)
Lymphs Abs: 1.1 10*3/uL (ref 0.7–4.0)
MCHC: 35.9 g/dL (ref 30.0–36.0)
MCV: 95.2 fl (ref 78.0–100.0)
Monocytes Absolute: 0.5 10*3/uL (ref 0.1–1.0)
Monocytes Relative: 9.3 % (ref 3.0–12.0)
Neutro Abs: 3.7 10*3/uL (ref 1.4–7.7)
Neutrophils Relative %: 67.5 % (ref 43.0–77.0)
Platelets: 293 10*3/uL (ref 150.0–400.0)
RBC: 3.85 Mil/uL — ABNORMAL LOW (ref 3.87–5.11)
RDW: 13.1 % (ref 11.5–15.5)
WBC: 5.5 10*3/uL (ref 4.0–10.5)

## 2022-11-13 LAB — COMPREHENSIVE METABOLIC PANEL
ALT: 14 U/L (ref 0–35)
AST: 17 U/L (ref 0–37)
Albumin: 4.5 g/dL (ref 3.5–5.2)
Alkaline Phosphatase: 91 U/L (ref 39–117)
BUN: 27 mg/dL — ABNORMAL HIGH (ref 6–23)
CO2: 22 mEq/L (ref 19–32)
Calcium: 9.4 mg/dL (ref 8.4–10.5)
Chloride: 102 mEq/L (ref 96–112)
Creatinine, Ser: 1.11 mg/dL (ref 0.40–1.20)
GFR: 48.92 mL/min — ABNORMAL LOW (ref >60.00)
Glucose, Bld: 93 mg/dL (ref 70–99)
Potassium: 4.9 mEq/L (ref 3.5–5.1)
Sodium: 133 mEq/L — ABNORMAL LOW (ref 135–145)
Total Bilirubin: 0.4 mg/dL (ref 0.2–1.2)
Total Protein: 6.9 g/dL (ref 6.0–8.3)

## 2022-11-13 LAB — TSH: TSH: 2.99 u[IU]/mL (ref 0.35–5.50)

## 2022-11-13 LAB — SEDIMENTATION RATE: Sed Rate: 1 mm/hr (ref 0–30)

## 2022-11-13 NOTE — Patient Instructions (Addendum)
Your provider has requested that you go to the basement level for lab work before leaving today. Press "B" on the elevator. The lab is located at the first door on the left as you exit the elevator.  You are schedule for follow up  on 01/12/23 at 9:00 am   You may have POST INFECTIOUS IBS OR IRRITABLE BOWEL  Can do BRAT diet versus low FODMAP- see below ADD ON ALIGN, AT LEAST 1 BILLION LIVE CULTURES  Probiotics are microorganisms which may be beneficial to the function of the gastrointestinal tract and are used in several different GI conditions including Irritable Bowel Syndrome and following infections involving the GI tract.  Two commonly used probiotics are:  VSL#3 (Bifidobacterium breve, B. longum, B. infantis, Lactobacillus acidophilus, L. plantarum, L. paracasei, L. bulgaricus, Streptococcus thermophilus) and Align (B. Infantis).  I recommend you take one of these two probiotics once daily FOR 2-4 WEEKS.  Please avoid milk products, high fat foods, artifical sweeteners, and carbonated beverages until symptoms resolve Add on fiber supplement  FODMAP stands for fermentable oligo-, di-, mono-saccharides and polyols (1). These are the scientific terms used to classify groups of carbs that are notorious for triggering digestive symptoms like bloating, gas and stomach pain.    Colon cancer is 3rd most diagnosed cancer and 2nd leading cause of death in both men and women 30 years of age and older despite being one of the most preventable and treatable cancers if found early.  4 of out 5 people diagnosed with colon cancer have NO prior family history.  When caught EARLY 90% of colon cancer is curable.  If your blood pressure at your visit was 140/90 or greater, please contact your primary care physician to follow up on this.  _______________________________________________________  If you are age 24 or older, your body mass index should be between 23-30. Your Body mass index is 26.73  kg/m. If this is out of the aforementioned range listed, please consider follow up with your Primary Care Provider.  If you are age 25 or younger, your body mass index should be between 19-25. Your Body mass index is 26.73 kg/m. If this is out of the aformentioned range listed, please consider follow up with your Primary Care Provider.   ________________________________________________________  The [facility] providers would like to encourage you to use [facility] to communicate with providers for non-urgent requests or questions.  Due to long hold times on the telephone, sending your provider a message by [facility] may be a faster and more efficient way to get a response.  Please allow 48 business hours for a response.  Please remember that this is for non-urgent requests.  _______________________________________________________  Due to recent changes in healthcare laws, you may see the results of your imaging and laboratory studies on MyChart before your provider has had a chance to review them.  We understand that in some cases there may be results that are confusing or concerning to you. Not all laboratory results come back in the same time frame and the provider may be waiting for multiple results in order to interpret others.  Please give [place] 48 hours in order for your provider to thoroughly review all the results before contacting the office for clarification of your results.    Thank you for entrusting me with your care and choosing [facility].  Vicie Mutters PA-C

## 2022-11-14 ENCOUNTER — Other Ambulatory Visit: Payer: Medicare PPO

## 2022-11-14 DIAGNOSIS — R197 Diarrhea, unspecified: Secondary | ICD-10-CM

## 2022-11-14 DIAGNOSIS — R194 Change in bowel habit: Secondary | ICD-10-CM | POA: Diagnosis not present

## 2022-11-14 LAB — TISSUE TRANSGLUTAMINASE, IGA: (tTG) Ab, IgA: <1 U/mL

## 2022-11-14 LAB — IGA: Immunoglobulin A: 115 mg/dL (ref 70–320)

## 2022-11-15 ENCOUNTER — Other Ambulatory Visit: Payer: Self-pay

## 2022-11-15 DIAGNOSIS — E871 Hypo-osmolality and hyponatremia: Secondary | ICD-10-CM

## 2022-11-15 LAB — CLOSTRIDIUM DIFFICILE TOXIN B, QUALITATIVE, REAL-TIME PCR: Toxigenic C. Difficile by PCR: NOT DETECTED

## 2022-11-16 ENCOUNTER — Encounter: Payer: Self-pay | Admitting: Physical Therapy

## 2022-11-16 ENCOUNTER — Telehealth: Payer: Self-pay | Admitting: Internal Medicine

## 2022-11-16 DIAGNOSIS — N3020 Other chronic cystitis without hematuria: Secondary | ICD-10-CM | POA: Diagnosis not present

## 2022-11-16 LAB — GI PROFILE, STOOL, PCR

## 2022-11-16 MED ORDER — CEPHALEXIN 500 MG PO CAPS: 500.0000 mg | ORAL_CAPSULE | Freq: Three times a day (TID) | ORAL | 0 refills | Status: DC

## 2022-11-16 NOTE — Telephone Encounter (Signed)
Called Pt to let her know that Dr Chase Caller sent her an antibiotic to her pharmacy ( [facility] [address] ). Pt stated understanding and nothing further is needed at this time.

## 2022-11-16 NOTE — Telephone Encounter (Signed)
LMOM for Pt to return call to discuss her symptoms.

## 2022-11-16 NOTE — Telephone Encounter (Signed)
Re [name] -please let her know that I saw her walking into [facility] to see Alena Bills.  Hope she enjoyed it.  She went in before I could catch up to her and say hi   Yes I agree with antibiotics probably has acute bronchitis. REc  cephalexin '500mg'$  three times daily x  5 days      Allergies  Allergen Reactions   Doxycycline Rash   Other Other (See Comments)    Surgical glue : hives   Oxycodone Nausea And Vomiting   Sulfa Antibiotics Hives   Trimethoprim Sulfate [Trimethoprim]     rash   Codeine Nausea Only   Propranolol Hcl Other (See Comments)    "Dizziness and heart racing"

## 2022-11-17 NOTE — Progress Notes (Signed)
Addendum: Reviewed and agree with assessment and management plan. Xavia Kniskern M, MD  

## 2022-11-23 ENCOUNTER — Encounter: Payer: Self-pay | Admitting: Physical Therapy

## 2022-11-29 DIAGNOSIS — M5412 Radiculopathy, cervical region: Secondary | ICD-10-CM | POA: Diagnosis not present

## 2022-12-18 ENCOUNTER — Encounter: Payer: Self-pay | Admitting: *Deleted

## 2022-12-27 DIAGNOSIS — S52501A Unspecified fracture of the lower end of right radius, initial encounter for closed fracture: Secondary | ICD-10-CM | POA: Diagnosis not present

## 2023-01-01 ENCOUNTER — Telehealth: Payer: Self-pay | Admitting: Physician Assistant

## 2023-01-01 NOTE — Telephone Encounter (Signed)
Inbound call from patient stating that her symptoms have worsened and is requesting a call back to discuss if there is any possible way she can be worked in before 3/15 at 9:00. Please advise.

## 2023-01-01 NOTE — Telephone Encounter (Signed)
Patient returned call

## 2023-01-01 NOTE — Telephone Encounter (Signed)
Left message for pt to call back.  Discussed with pt that there are no sooner appts available, she will keep appt for 01/12/23.

## 2023-01-01 NOTE — Telephone Encounter (Signed)
I will send to Amanda's nurse

## 2023-01-02 ENCOUNTER — Encounter: Payer: Self-pay | Admitting: Internal Medicine

## 2023-01-02 ENCOUNTER — Ambulatory Visit: Payer: Medicare PPO | Admitting: Internal Medicine

## 2023-01-02 VITALS — BP 126/76 | HR 81 | Temp 97.8°F | Resp 16 | Ht 62.0 in | Wt 145.5 lb

## 2023-01-02 DIAGNOSIS — R399 Unspecified symptoms and signs involving the genitourinary system: Secondary | ICD-10-CM | POA: Diagnosis not present

## 2023-01-02 LAB — POC URINALSYSI DIPSTICK (AUTOMATED)
Bilirubin, UA: NEGATIVE
Blood, UA: NEGATIVE
Glucose, UA: NEGATIVE
Ketones, UA: NEGATIVE
Nitrite, UA: NEGATIVE
Protein, UA: NEGATIVE
Spec Grav, UA: 1.01 (ref 1.010–1.025)
Urobilinogen, UA: 0.2 E.U./dL
pH, UA: 6 (ref 5.0–8.0)

## 2023-01-02 MED ORDER — NITROFURANTOIN MONOHYD MACRO 100 MG PO CAPS: 100.0000 mg | ORAL_CAPSULE | Freq: Two times a day (BID) | ORAL | 0 refills | Status: DC

## 2023-01-02 NOTE — Patient Instructions (Signed)
Continue drinking plenty of fluids  Continue probiotics  Start taking Macrobid, an antibiotic for urinary tract infections.  Call if not gradually better

## 2023-01-02 NOTE — Progress Notes (Signed)
Subjective:    Patient ID: Cynthia Thornton, female    DOB: 29-Aug-1946, 77 y.o.   MRN: [id number]  DOS:  01/02/2023 Type of visit - description: Acute  Symptoms started about 2 days ago with dysuria and urinary frequency. No fever or chills No gross hematuria No vaginal discharge.  Also, having ongoing GI symptoms since 10/31/2022: Diarrhea most days associated with lower abdominal cramps with some nausea but no vomiting. Saw GI, note reviewed.  Wt Readings from Last 3 Encounters:  01/02/23 145 lb 8 oz (66 kg)  11/13/22 146 lb 2 oz (66.3 kg)  11/10/22 146 lb 3.2 oz (66.3 kg)     Review of Systems See above   Past Medical History:  Diagnosis Date   Arthritis    Breast cancer, left [facility]) oncologist-- dr [name]   dx 09-26-2017--- IDC, Stage IA, Grade 2, ER/PR +;  HER2 negative;  s/p  breast lumpectomy w/ node dissection 11-02-2017;  completed radiation 01-09-2018 (left breast genetic screening panel 2017 with variant of unknown significance AXINA)   COPD (chronic obstructive pulmonary disease) ([place])    pulmologist-- dr Dillard Essex-- w/ chronic lingular scarring  (last exceratbation bronchiectasis 07/ 2020)   Dyslipidemia    followed by cardiology/ vascular-- dr g. adams ([facility] in [place])  family history strokes   Eczema    Family history of breast cancer    Family history of colon cancer    GERD (gastroesophageal reflux disease)    Hiatal hernia    History of esophageal stricture    s/p dilatation 2008   History of external beam radiation therapy    left breast 12-19-2017  to 01-09-2018   History of hyperthyroidism    endocrinologist-- (lov 04-11-2019 epic) dr Loanne Drilling, dx 2009 due to multinodular goiter ,  had taken medication for few months 2010 stopped due to norma TFT;     Insomnia    Multinodular goiter last thyroid ultrasound in epic 12-18-2018   endocrinologist-- dr Loanne Drilling,  dx 2009 w/ hyperthroidism with medication until normal TFT in 2010;      Osteopenia 02/2018   T score -2.4 distal third of radius.  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   Wears contact lenses     Past Surgical History:  Procedure Laterality Date   BREAST LUMPECTOMY WITH RADIOACTIVE SEED AND SENTINEL LYMPH NODE BIOPSY Left 11/02/2017   Procedure: LEFT BREAST LUMPECTOMY WITH RADIOACTIVE SEED AND LEFT AXILLARY DEEP SENTINEL LYMPH NODE BIOPSY, INJECT BLUE DYE LEFT BREAST;  Surgeon: [name], MD;  Location: [place];  Service: General;  Laterality: Left;   BREAST SURGERY  2000   Breast cyst removed, left   LAPAROSCOPIC BILATERAL SALPINGO OOPHERECTOMY Bilateral 10/13/2019   Procedure: LAPAROSCOPIC BILATERAL SALPINGO OOPHORECTOMY LYSIS OF ADHESIONS PERITONEAL WASHINGS ;  Surgeon: [name], MD;  Location: [place];  Service: Gynecology;  Laterality: Bilateral;  request 7:30am OR time in [place] block requests one hour   TONSILLECTOMY  age 31   TUBAL LIGATION Bilateral yrs ago   [place]   Right ovarian cystectomy  VIDEO BRONCHOSCOPY Bilateral 03/16/2015   Procedure: VIDEO BRONCHOSCOPY WITHOUT FLUORO;  Surgeon: Brand Males, MD;  Location: [facility] ENDOSCOPY;  Service: Endoscopy;  Laterality: Bilateral;    Current Outpatient Medications  Medication Instructions   B Complex-C (B-COMPLEX WITH VITAMIN C) tablet 1 tablet, Oral, Daily   cephALEXin (KEFLEX) 500 mg, Oral, 2 times daily   cephALEXin (KEFLEX) 500 mg, Oral, 3 times daily   cetirizine (ZYRTEC) 10 mg, Oral, Daily   clorazepate (TRANXENE) 7.5 mg, Oral, At bedtime PRN   CRANBERRY PO 2 tablets,  Oral, 2 times daily   diazepam (VALIUM) 2 mg, Oral, Every 6 hours PRN   famotidine (PEPCID) 20 mg, Oral, Daily   methenamine (HIPREX) 1 g, Oral, 2 times daily with meals, Take with '500mg'$  of vitamin c w/ each tablet    methocarbamol (ROBAXIN) 500 mg, Every 8 hours PRN   omeprazole (PRILOSEC) 40 mg, Oral, Daily   ondansetron (ZOFRAN-ODT) 4 mg, Oral, Every 8 hours PRN   Probiotic Product (PROBIOTIC PO) Oral   valACYclovir (VALTREX) 500 mg, Oral, Daily, 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   Vitamin D 2,000 Units, Oral, Daily       Objective:   Physical Exam BP 126/76   Pulse 81   Temp 97.8 F (36.6 C) (Oral)   Resp 16   Ht '5\' 2"'$  (1.575 m)   Wt 145 lb 8 oz (66 kg)   SpO2 98%   BMI 26.61 kg/m  General:   Well developed, NAD, BMI noted.  HEENT:  Normocephalic . Face symmetric, atraumatic Abdomen:  Not distended, soft, minimal discomfort on the lower abdomen without mass or rebound.  No CVA tenderness Skin: Not pale. Not jaundice Lower extremities: no pretibial edema bilaterally  Neurologic:  alert & oriented X3.  Speech normal, gait appropriate for age and unassisted Psych--  Cognition and judgment appear intact.  Cooperative with normal attention span and concentration.  Behavior appropriate. No anxious or depressed appearing.     Assessment     Assessment  Bronchiectasis Dr Chase Caller Anxiety - Insomnia : tranxene   At hs, rx by gyn, previously, I'll Rx if needed  Endocrinology Dr. Loanne Drilling q 3 years, Last visit 03/2015 --H/o thyrotoxicosis on remission --Goiter   Osteopenia --T score -1.5  (2015), dexa 02-2016 , dexa 03/19/2018 (at gyn) DJD -- [facility]: GERD, HH and esophageal stricture EGD 2007, colon polyps Breast cancer, DX 08-2017, s/p lumpectomy,  XRT, B salpingo-oophorectomy Recurrent UTIs, onset ~ 2018   PLAN Recurrent  UTI: Was seen 11/10/2022, urine culture showed E. coli.  Prescribed Keflex, and referred  to urogynecology. She presents today with urinary  symptoms for 2 days, Udip c/w infex.  I suggested possibly wait for the urine culture to provide a more targeted treatment but she declines, in her experience if this goes untreated for too long she might get septic.  She also understand the risk of C. difficile with frequent ABX. She already has a appointment with urogynecology regards recommend UTIs.  (schedule April 2024)   Plan:  Macrobid, fluids, UA urine culture pending. Diarrhea has diarrhea since 10/31/2022 along with mild lower abdominal cramps and some nausea.  Saw GI 11/13/2022,    GI Labs for celiac panel, CRP, C. difficile: Negative Plans to see him again in a few days.

## 2023-01-02 NOTE — Assessment & Plan Note (Signed)
Recurrent  UTI: Was seen 11/10/2022, urine culture showed E. coli.  Prescribed Keflex, and referred  to urogynecology. She presents today with urinary symptoms for 2 days, Udip c/w infex.  I suggested possibly wait for the urine culture to provide a more targeted treatment but she declines, in her experience if this goes untreated for too long she might get septic.  She also understand the risk of C. difficile with frequent ABX. She already has a appointment with urogynecology regards recommend UTIs.  (schedule April 2024)   Plan:  Macrobid, fluids, UA urine culture pending. Diarrhea has diarrhea since 10/31/2022 along with mild lower abdominal cramps and some nausea.  Saw GI 11/13/2022,    GI Labs for celiac panel, CRP, C. difficile: Negative Plans to see him again in a few days.

## 2023-01-03 LAB — URINALYSIS, ROUTINE W REFLEX MICROSCOPIC
Bilirubin Urine: NEGATIVE
Hgb urine dipstick: NEGATIVE
Ketones, ur: NEGATIVE
Nitrite: NEGATIVE
RBC / HPF: NONE SEEN (ref 0–?)
Specific Gravity, Urine: 1.02 (ref 1.000–1.030)
Total Protein, Urine: NEGATIVE
Urine Glucose: NEGATIVE
Urobilinogen, UA: 0.2 (ref 0.0–1.0)
pH: 6 (ref 5.0–8.0)

## 2023-01-04 ENCOUNTER — Other Ambulatory Visit: Payer: Self-pay | Admitting: Internal Medicine

## 2023-01-04 LAB — URINE CULTURE
MICRO NUMBER:: 14651038
SPECIMEN QUALITY:: ADEQUATE

## 2023-01-05 ENCOUNTER — Encounter: Payer: Self-pay | Admitting: Internal Medicine

## 2023-01-05 MED ORDER — NITROFURANTOIN MONOHYD MACRO 100 MG PO CAPS: 100.0000 mg | ORAL_CAPSULE | Freq: Two times a day (BID) | ORAL | 0 refills | Status: DC

## 2023-01-07 ENCOUNTER — Other Ambulatory Visit: Payer: Self-pay | Admitting: Physician Assistant

## 2023-01-09 DIAGNOSIS — N1831 Chronic kidney disease, stage 3a: Secondary | ICD-10-CM | POA: Diagnosis not present

## 2023-01-09 DIAGNOSIS — E785 Hyperlipidemia, unspecified: Secondary | ICD-10-CM | POA: Diagnosis not present

## 2023-01-09 DIAGNOSIS — E871 Hypo-osmolality and hyponatremia: Secondary | ICD-10-CM | POA: Diagnosis not present

## 2023-01-09 DIAGNOSIS — E0590 Thyrotoxicosis, unspecified without thyrotoxic crisis or storm: Secondary | ICD-10-CM | POA: Diagnosis not present

## 2023-01-09 DIAGNOSIS — K219 Gastro-esophageal reflux disease without esophagitis: Secondary | ICD-10-CM | POA: Diagnosis not present

## 2023-01-11 DIAGNOSIS — M4312 Spondylolisthesis, cervical region: Secondary | ICD-10-CM | POA: Diagnosis not present

## 2023-01-11 DIAGNOSIS — M5459 Other low back pain: Secondary | ICD-10-CM | POA: Diagnosis not present

## 2023-01-11 DIAGNOSIS — M4125 Other idiopathic scoliosis, thoracolumbar region: Secondary | ICD-10-CM | POA: Diagnosis not present

## 2023-01-11 DIAGNOSIS — M8938 Hypertrophy of bone, other site: Secondary | ICD-10-CM | POA: Diagnosis not present

## 2023-01-11 NOTE — Progress Notes (Signed)
01/12/2023 Cynthia Thornton:[id number] December 10, 1945  Referring provider: Colon Branch, MD Primary GI doctor: Dr. Hilarie Fredrickson  ASSESSMENT AND PLAN:   Diarrhea, AB pain/cramping, history of breast cancer, bloating OV in Jan with just change in consistency of stools with ABX use, started on probiotic with improvement of symptoms. 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  - get labs with CBC, CMET - will consider ABX treatment if signs of infection or for possible SIBO Patient due for colonoscopy 04/2024, if continues can consider colonoscopy to rule out microscopic colitis.  Follow up 2-3 months.   Benign neoplasm of colon, unspecified part of colon with family history of colon cancer 05/29/2019 colonoscopy with small internal hemorrhoids and otherwise normal.  Repeat recommended in 5 years given family history.  Gastroesophageal reflux disease without esophagitis Continue PPI daily No symptoms at this time  Patient Care Team: Colon Branch, MD as PCP - General Gaynelle Arabian, MD as Consulting Physician (Orthopedic Surgery) Nicholas Lose, MD as Consulting Physician (Hematology and Oncology) [name], MD as Consulting Physician (General Surgery) Brand Males, MD as Consulting Physician (Pulmonary Disease) Kyung Rudd, MD as Consulting Physician (Radiation Oncology) Delice Bison, Charlestine Massed, NP as Nurse Practitioner (Hematology and Oncology) Ardis Hughs, MD as Attending Physician (Urology) Melida Quitter, MD as Consulting Physician (Otolaryngology) Patrici Ranks. (Inactive) (Cardiology) [name], MD as Consulting Physician (Obstetrics and Gynecology)  HISTORY OF PRESENT ILLNESS: 77 y.o. female with a past medical history of hiatal hernia with GERD, history of esophageal stricture, personal history of colon polyps with family history of colon cancer in her father and uncle in 71s, bronchiectasis, anemia, left breast carcinoma 2018 and others listed below presents for evaluation of dark watery stools.   09/18/2012 EGD was normal. 05/29/2019 colonoscopy with small internal hemorrhoids and otherwise normal.  Repeat recommended in 5 years given family history.  Recall 04/2024 12/29/2021 office visit with Ellouise Newer for refill of PPI for GERD, on prilosec daily 40 mg since 2004.  11/13/2022 office visit with myself for diarrhea/change in bowel habits for 2 weeks frequent antibiotics GI profile stool negative, celiac negative, thyroid unremarkable, sed rate normal, C. difficile negative CBC without anemia or infection hyponatremia 133, slightly elevated BUN 27 Patient started on align. 01/01/2023 called stating she had worsening symptoms.  Started on Macrobid 01/02/2023  She states prion in Jan, she did not have any AB pain, just change in bowel habits.  She tried probiotic and her Bm's improved.   Then she went to friends house 2 weeks ago, had abnormal food she normally does not eat at friends house and that next morning she had AB pain and diarrhea, nausea. No fever. Chills.  No one else at the party was sick.   She had diarrhea off and on all day with AB pain, the diarrhea resolved Monday but she continues to have one loose BM in the morning with AB pain.  She states the stools have gotten better but she continues to have AB pain/cramping and can be severe at time.   She has had  some nausea, one vomiting episode with diarrhea in the morning.  She has to get up 30 mins early to allow for loose stool and AB pain but denies nocturnal  symptoms.  She has been taking pepto without help.  She is having increase gas/bloating.  No unintentional weight loss, no appetite changes.  Denies never, chills.  She denies fecal urgency, fecal incontinence.   Wt Readings from Last 4 Encounters:  01/12/23 144 lb 2 oz (65.4 kg)  01/02/23 145 lb 8 oz (66 kg)  11/13/22 146 lb 2 oz (66.3 kg)  11/10/22 146 lb 3.2 oz (66.3 kg)     She  reports that she quit smoking about 27 years ago. Her smoking use included cigarettes. She has a 3.00 pack-year smoking history. She has never used smokeless tobacco. She reports current alcohol use. She reports that she does not use drugs.  Current Medications:     Current Outpatient Medications (Respiratory):    cetirizine (ZYRTEC) 10 MG tablet, Take 10 mg by mouth daily.    Current Outpatient Medications (Other):    B Complex-C (B-COMPLEX WITH VITAMIN C) tablet, Take 1 tablet by mouth daily.   Cholecalciferol (VITAMIN D) 2000 units CAPS, Take 2,000 Units by mouth daily.   clorazepate (TRANXENE) 7.5 MG tablet, Take 1 tablet (7.5 mg total) by mouth at bedtime as needed for anxiety.   CRANBERRY PO, Take 2 tablets by mouth in the morning and at bedtime.   diazepam (VALIUM) 2 MG tablet, Take 2 mg by mouth every 6 (six) hours as needed.   dicyclomine (BENTYL) 20 MG tablet, Take 1 tablet (20 mg total) by mouth 3 (three) times daily as needed for spasms.   famotidine (PEPCID) 20 MG tablet, Take 1 tablet (20 mg total) by mouth daily.   methenamine (HIPREX) 1 g tablet, Take 1 g by mouth 2 (two) times daily with a meal. Take with 500mg  of vitamin c w/ each tablet   methocarbamol (ROBAXIN) 500 MG tablet, Take 500 mg by mouth every 8 (eight) hours as needed.   nitrofurantoin, macrocrystal-monohydrate, (MACROBID) 100 MG capsule, Take 1 capsule (100 mg total) by mouth 2 (two) times daily.   omeprazole (PRILOSEC) 40 MG capsule, TAKE 1 CAPSULE (40 MG TOTAL) BY MOUTH DAILY.   ondansetron (ZOFRAN-ODT) 4 MG  disintegrating tablet, TAKE 1 TABLET BY MOUTH EVERY 8 HOURS AS NEEDED FOR NAUSEA OR VOMITING   Probiotic Product (PROBIOTIC PO), Take by mouth.   valACYclovir (VALTREX) 500 MG tablet, Take 1 tablet (500 mg total) by mouth daily. 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  Medical History:  Past Medical History:  Diagnosis Date   Arthritis    Breast cancer, left [facility]) oncologist-- dr [name]   dx 09-26-2017--- IDC, Stage IA, Grade 2, ER/PR +;  HER2 negative;  s/p  breast lumpectomy w/ node dissection 11-02-2017;  completed radiation 01-09-2018 (left breast genetic screening panel 2017 with variant of unknown significance AXINA)   COPD (chronic obstructive pulmonary disease) ([place])    pulmologist-- dr Dillard Essex-- w/ chronic lingular scarring  (last exceratbation bronchiectasis 07/ 2020)   Dyslipidemia    followed by cardiology/ vascular-- dr g. adams ([facility] in [place])  family history strokes   Eczema    Family history of breast cancer    Family history of colon cancer    GERD (gastroesophageal reflux disease)    Hiatal hernia    History of esophageal stricture    s/p dilatation 2008   History of external beam  radiation therapy    left breast 12-19-2017  to 01-09-2018   History of hyperthyroidism    endocrinologist-- (lov 04-11-2019 epic) dr Loanne Drilling, dx 2009 due to multinodular goiter ,  had taken medication for few months 2010 stopped due to norma TFT;     Insomnia    Multinodular goiter last thyroid ultrasound in epic 12-18-2018   endocrinologist-- dr Loanne Drilling,  dx 2009 w/ hyperthroidism with medication until normal TFT in 2010;     Osteopenia 02/2018   T score -2.4 distal third of radius.  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   Wears contact lenses    Allergies:  Allergies  Allergen Reactions   Doxycycline Rash   Other Other (See Comments)    Surgical glue : hives   Oxycodone Nausea And Vomiting   Sulfa Antibiotics Hives   Trimethoprim Sulfate [Trimethoprim]     rash   Codeine Nausea Only   Propranolol Hcl Other (See Comments)    "Dizziness and heart racing"     Surgical History:  She  has a past surgical history that includes Video bronchoscopy (Bilateral, 03/16/2015); Breast lumpectomy with radioactive seed and sentinel lymph node biopsy (Left, 11/02/2017); Breast surgery (2000); Vaginal hysterectomy (1991); Tonsillectomy (age 84); Tubal ligation (Bilateral, yrs ago); and Laparoscopic bilateral salpingo oophorectomy (Bilateral, 10/13/2019). Family History:  Her family history includes Breast cancer in her cousin, maternal aunt, and sister; Colon cancer in her cousin; Colon cancer (age of onset: 77) in her paternal uncle; Colon cancer (age of onset: 68) in her father; Crohn's disease in her son; Diabetes in her father; Hypertension in her father; Leukemia in her maternal grandfather; Lung cancer in her mother; Osteoporosis in her father and mother; Ovarian cancer (age of onset: 9) in her maternal aunt; Stroke in her mother.  REVIEW OF SYSTEMS  : All other systems reviewed and negative except where noted in the History of Present Illness.  PHYSICAL EXAM: BP 128/80 (BP Location: Left Arm, Patient Position: Sitting, Cuff Size: Normal)   Pulse 76   Ht 5\' 2"  (1.575 m)   Wt 144 lb 2 oz (65.4 kg)   SpO2 94%   BMI 26.36 kg/m  General:   Pleasant, well developed female in no acute distress Head:   Normocephalic and atraumatic. Eyes:  sclerae anicteric,conjunctive pink  Heart:   regular rate and rhythm Pulm:  Clear anteriorly; no wheezing Abdomen:   Soft, Non-distended AB, Active bowel sounds. mild- moderate LLQ tenderness .  Without guarding and Without rebound, No organomegaly appreciated. Rectal: Not evaluated Extremities:  Without edema. Msk: Symmetrical without gross deformities. Peripheral pulses intact.  Neurologic:  Alert and  oriented x4;  No focal deficits.  Skin:   Dry and intact without significant lesions or rashes. Psychiatric:  Cooperative. Normal mood and affect.  RELEVANT LABS AND IMAGING: CBC    Component Value Date/Time   WBC 5.5 11/13/2022 1005   RBC 3.85 (L) 11/13/2022 1005   HGB 13.1 11/13/2022 1005   HGB 13.0 10/03/2017 0837   HCT 36.7 11/13/2022 1005   HCT 37.6 10/03/2017 0837   PLT 293.0 11/13/2022 1005  PLT 305 10/03/2017 0837   MCV 95.2 11/13/2022 1005   MCV 88.3 10/03/2017 0837   MCH 30.2 08/13/2020 0957   MCHC 35.9 11/13/2022 1005   RDW 13.1 11/13/2022 1005   RDW 13.4 10/03/2017 0837   LYMPHSABS 1.1 11/13/2022 1005   LYMPHSABS 1.5 10/03/2017 0837   MONOABS 0.5 11/13/2022 1005   MONOABS 0.5 10/03/2017 0837   EOSABS 0.1 11/13/2022 1005   EOSABS 0.2 10/03/2017 0837   BASOSABS 0.1 11/13/2022 1005   BASOSABS 0.1 10/03/2017 0837    CMP     Component Value Date/Time   NA 133 (L) 11/13/2022 1005   NA 136 (A) 10/09/2022 0000   NA 134 (L) 10/03/2017 0837   K 4.9 11/13/2022 1005   K 4.3 10/03/2017 0837   CL 102 11/13/2022 1005   CO2 22 11/13/2022 1005   CO2 23 10/03/2017 0837   GLUCOSE 93 11/13/2022 1005   GLUCOSE 90 10/03/2017 0837   BUN 27 (H) 11/13/2022 1005   BUN 16 10/09/2022 0000   BUN 24.3 10/03/2017 0837   CREATININE 1.11 11/13/2022 1005   CREATININE 0.93 08/13/2020 0957   CREATININE 1.0 10/03/2017 0837   CALCIUM 9.4 11/13/2022 1005   CALCIUM 9.7 10/03/2017 0837   PROT 6.9 11/13/2022 1005   PROT 7.7 10/03/2017 0837   ALBUMIN 4.5 11/13/2022 1005   ALBUMIN 4.7 10/03/2017 0837   AST 17 11/13/2022 1005   AST 21 10/03/2017 0837   ALT 14 11/13/2022 1005   ALT 17 10/03/2017 0837   ALKPHOS 91 11/13/2022 1005   ALKPHOS 107 10/03/2017 0837   BILITOT 0.4  11/13/2022 1005   BILITOT 0.44 10/03/2017 0837   GFRNONAA >60 05/18/2020 1147   GFRAA >60 05/18/2020 [place] Delenn Ahn, PA-C 9:53 AM

## 2023-01-12 ENCOUNTER — Other Ambulatory Visit (INDEPENDENT_AMBULATORY_CARE_PROVIDER_SITE_OTHER): Payer: Medicare PPO

## 2023-01-12 ENCOUNTER — Ambulatory Visit: Payer: Medicare PPO | Admitting: Physician Assistant

## 2023-01-12 ENCOUNTER — Encounter: Payer: Self-pay | Admitting: Physician Assistant

## 2023-01-12 VITALS — BP 128/80 | HR 76 | Ht 62.0 in | Wt 144.1 lb

## 2023-01-12 DIAGNOSIS — R1032 Left lower quadrant pain: Secondary | ICD-10-CM

## 2023-01-12 DIAGNOSIS — R195 Other fecal abnormalities: Secondary | ICD-10-CM | POA: Diagnosis not present

## 2023-01-12 DIAGNOSIS — R1084 Generalized abdominal pain: Secondary | ICD-10-CM

## 2023-01-12 DIAGNOSIS — A048 Other specified bacterial intestinal infections: Secondary | ICD-10-CM | POA: Diagnosis not present

## 2023-01-12 LAB — COMPREHENSIVE METABOLIC PANEL
ALT: 12 U/L (ref 0–35)
AST: 15 U/L (ref 0–37)
Albumin: 4.3 g/dL (ref 3.5–5.2)
Alkaline Phosphatase: 86 U/L (ref 39–117)
BUN: 32 mg/dL — ABNORMAL HIGH (ref 6–23)
CO2: 22 mEq/L (ref 19–32)
Calcium: 9.5 mg/dL (ref 8.4–10.5)
Chloride: 104 mEq/L (ref 96–112)
Creatinine, Ser: 0.93 mg/dL (ref 0.40–1.20)
GFR: 60.42 mL/min (ref >60.00)
Glucose, Bld: 88 mg/dL (ref 70–99)
Potassium: 4.2 mEq/L (ref 3.5–5.1)
Sodium: 134 mEq/L — ABNORMAL LOW (ref 135–145)
Total Bilirubin: 0.3 mg/dL (ref 0.2–1.2)
Total Protein: 7 g/dL (ref 6.0–8.3)

## 2023-01-12 LAB — CBC WITH DIFFERENTIAL/PLATELET
Basophils Absolute: 0.1 10*3/uL (ref 0.0–0.1)
Basophils Relative: 0.9 % (ref 0.0–3.0)
Eosinophils Absolute: 0.1 10*3/uL (ref 0.0–0.7)
Eosinophils Relative: 1.9 % (ref 0.0–5.0)
HCT: 36.6 % (ref 36.0–46.0)
Hemoglobin: 12.7 g/dL (ref 12.0–15.0)
Lymphocytes Relative: 25.6 % (ref 12.0–46.0)
Lymphs Abs: 1.9 10*3/uL (ref 0.7–4.0)
MCHC: 34.6 g/dL (ref 30.0–36.0)
MCV: 95.6 fl (ref 78.0–100.0)
Monocytes Absolute: 0.8 10*3/uL (ref 0.1–1.0)
Monocytes Relative: 10.8 % (ref 3.0–12.0)
Neutro Abs: 4.5 10*3/uL (ref 1.4–7.7)
Neutrophils Relative %: 60.8 % (ref 43.0–77.0)
Platelets: 339 10*3/uL (ref 150.0–400.0)
RBC: 3.83 Mil/uL — ABNORMAL LOW (ref 3.87–5.11)
RDW: 13.6 % (ref 11.5–15.5)
WBC: 7.3 10*3/uL (ref 4.0–10.5)

## 2023-01-12 LAB — LAB REPORT - SCANNED: EGFR: 48

## 2023-01-12 LAB — SEDIMENTATION RATE: Sed Rate: 2 mm/hr (ref 0–30)

## 2023-01-12 MED ORDER — DICYCLOMINE HCL 20 MG PO TABS: 20.0000 mg | ORAL_TABLET | Freq: Three times a day (TID) | ORAL | 0 refills | Status: DC | PRN

## 2023-01-12 NOTE — Patient Instructions (Addendum)
Your provider has requested that you go to the basement level for lab work before leaving today. Press "B" on the elevator. The lab is located at the first door on the left as you exit the elevator.   First do a trial off milk/lactose products if you use them.  Add fiber like benefiber or citracel once a day Can do trial of IBGard which is over the counter for AB pain- Take 1-2 capsules once a day for maintence or twice a day during a flare Can send in an anti spasm medication, Bentyl, to take as needed  We may want to evaluate you for small intestinal bacterial overgrowth, this can cause increase gas, bloating, loose stools. There is a test for this we can do or sometimes we will treat a patient with an antibiotic to see if it helps.   We have sent the following medications to your pharmacy for you to pick up at your convenience: Bentyl  You have been scheduled for a CT scan of the abdomen and pelvis at [facility], 1st floor Radiology. You are scheduled on 4/1/20244 at 4:30pm. You should arrive at 2:30pm for  your appointment time for registration and to drink the contrast. 1 Please follow the written instructions below on the day of your exam:   1) Do not eat anything after 12:30pm (4 hours prior to your test)   You may take any medications as prescribed with a small amount of water, if necessary. If you take any of the following medications: METFORMIN, GLUCOPHAGE, GLUCOVANCE, AVANDAMET, RIOMET, FORTAMET, [place] MET, JANUMET, GLUMETZA or METAGLIP, you MAY be asked to HOLD this medication 48 hours AFTER the exam.   The purpose of you drinking the oral contrast is to aid in the visualization of your intestinal tract. The contrast solution may cause some diarrhea. Depending on your individual set of symptoms, you may also receive an intravenous injection of x-ray contrast/dye. Plan on being at [facility] for 45 minutes or longer, depending on the type of exam you are having  performed.   If you have any questions regarding your exam or if you need to reschedule, you may call [facility] at [phone] between the hours of 8:00 am and 5:00 pm, Monday-Friday.     I appreciate the opportunity to care for you. Vicie Mutters, PA-C

## 2023-01-16 ENCOUNTER — Other Ambulatory Visit: Payer: Medicare PPO

## 2023-01-16 DIAGNOSIS — A048 Other specified bacterial intestinal infections: Secondary | ICD-10-CM | POA: Diagnosis not present

## 2023-01-16 DIAGNOSIS — M25631 Stiffness of right wrist, not elsewhere classified: Secondary | ICD-10-CM | POA: Diagnosis not present

## 2023-01-17 LAB — CLOSTRIDIUM DIFFICILE TOXIN B, QUALITATIVE, REAL-TIME PCR: Toxigenic C. Difficile by PCR: NOT DETECTED

## 2023-01-18 DIAGNOSIS — M4125 Other idiopathic scoliosis, thoracolumbar region: Secondary | ICD-10-CM | POA: Diagnosis not present

## 2023-01-18 DIAGNOSIS — M4312 Spondylolisthesis, cervical region: Secondary | ICD-10-CM | POA: Diagnosis not present

## 2023-01-18 DIAGNOSIS — M5459 Other low back pain: Secondary | ICD-10-CM | POA: Diagnosis not present

## 2023-01-20 LAB — STOOL CULTURE: E coli, Shiga toxin Assay: NEGATIVE

## 2023-01-25 DIAGNOSIS — M4125 Other idiopathic scoliosis, thoracolumbar region: Secondary | ICD-10-CM | POA: Diagnosis not present

## 2023-01-25 DIAGNOSIS — M4312 Spondylolisthesis, cervical region: Secondary | ICD-10-CM | POA: Diagnosis not present

## 2023-01-25 DIAGNOSIS — M5459 Other low back pain: Secondary | ICD-10-CM | POA: Diagnosis not present

## 2023-01-26 ENCOUNTER — Other Ambulatory Visit: Payer: Self-pay | Admitting: Physician Assistant

## 2023-01-27 NOTE — Progress Notes (Signed)
Addendum: Reviewed and agree with assessment and management plan. Shanicka Oldenkamp M, MD  

## 2023-01-29 ENCOUNTER — Encounter (HOSPITAL_COMMUNITY): Payer: Self-pay

## 2023-01-29 ENCOUNTER — Ambulatory Visit (HOSPITAL_COMMUNITY)
Admission: RE | Admit: 2023-01-29 | Discharge: 2023-01-29 | Disposition: A | Discharge status: Home / Self Care | Payer: Medicare PPO | Source: Ambulatory Visit | Attending: Physician Assistant | Admitting: Physician Assistant

## 2023-01-29 DIAGNOSIS — R1032 Left lower quadrant pain: Secondary | ICD-10-CM | POA: Diagnosis not present

## 2023-01-29 DIAGNOSIS — K7689 Other specified diseases of liver: Secondary | ICD-10-CM | POA: Diagnosis not present

## 2023-01-29 MED ORDER — IOHEXOL 9 MG/ML PO SOLN
500.0000 mL | ORAL | Status: AC
  Administered 2023-01-29 (×2): 500 mL via ORAL

## 2023-01-29 MED ORDER — IOHEXOL 300 MG/ML  SOLN
100.0000 mL | Freq: Once | INTRAMUSCULAR | Status: AC | PRN
  Administered 2023-01-29: 100 mL via INTRAVENOUS

## 2023-01-29 MED ORDER — IOHEXOL 9 MG/ML PO SOLN
ORAL | Status: AC
  Filled 2023-01-29: qty 1000

## 2023-01-30 DIAGNOSIS — M25631 Stiffness of right wrist, not elsewhere classified: Secondary | ICD-10-CM | POA: Diagnosis not present

## 2023-01-31 DIAGNOSIS — H25813 Combined forms of age-related cataract, bilateral: Secondary | ICD-10-CM | POA: Diagnosis not present

## 2023-01-31 DIAGNOSIS — H52203 Unspecified astigmatism, bilateral: Secondary | ICD-10-CM | POA: Diagnosis not present

## 2023-02-03 ENCOUNTER — Other Ambulatory Visit: Payer: Self-pay | Admitting: Physician Assistant

## 2023-02-12 DIAGNOSIS — L720 Epidermal cyst: Secondary | ICD-10-CM | POA: Diagnosis not present

## 2023-02-12 DIAGNOSIS — L218 Other seborrheic dermatitis: Secondary | ICD-10-CM | POA: Diagnosis not present

## 2023-02-12 DIAGNOSIS — L821 Other seborrheic keratosis: Secondary | ICD-10-CM | POA: Diagnosis not present

## 2023-02-12 DIAGNOSIS — L820 Inflamed seborrheic keratosis: Secondary | ICD-10-CM | POA: Diagnosis not present

## 2023-02-12 DIAGNOSIS — Z129 Encounter for screening for malignant neoplasm, site unspecified: Secondary | ICD-10-CM | POA: Diagnosis not present

## 2023-02-23 ENCOUNTER — Other Ambulatory Visit (HOSPITAL_COMMUNITY)
Admission: RE | Admit: 2023-02-23 | Discharge: 2023-02-23 | Disposition: A | Discharge status: Home / Self Care | Payer: Medicare PPO | Source: Other Acute Inpatient Hospital | Attending: Obstetrics and Gynecology | Admitting: Obstetrics and Gynecology

## 2023-02-23 ENCOUNTER — Ambulatory Visit: Payer: Medicare PPO | Admitting: Obstetrics and Gynecology

## 2023-02-23 ENCOUNTER — Encounter: Payer: Self-pay | Admitting: Obstetrics and Gynecology

## 2023-02-23 VITALS — BP 130/76 | HR 68 | Ht 62.72 in | Wt 146.4 lb

## 2023-02-23 DIAGNOSIS — R159 Full incontinence of feces: Secondary | ICD-10-CM | POA: Diagnosis not present

## 2023-02-23 DIAGNOSIS — N8110 Cystocele, unspecified: Secondary | ICD-10-CM

## 2023-02-23 DIAGNOSIS — N3281 Overactive bladder: Secondary | ICD-10-CM | POA: Diagnosis not present

## 2023-02-23 DIAGNOSIS — R82998 Other abnormal findings in urine: Secondary | ICD-10-CM | POA: Diagnosis not present

## 2023-02-23 DIAGNOSIS — R350 Frequency of micturition: Secondary | ICD-10-CM

## 2023-02-23 DIAGNOSIS — N390 Urinary tract infection, site not specified: Secondary | ICD-10-CM

## 2023-02-23 LAB — POCT URINALYSIS DIPSTICK
Bilirubin, UA: NEGATIVE
Glucose, UA: NEGATIVE
Ketones, UA: NEGATIVE
Nitrite, UA: NEGATIVE
Protein, UA: NEGATIVE
Spec Grav, UA: 1.02 (ref 1.010–1.025)
Urobilinogen, UA: 0.2 E.U./dL
pH, UA: 6 (ref 5.0–8.0)

## 2023-02-23 MED ORDER — NITROFURANTOIN MONOHYD MACRO 100 MG PO CAPS
100.0000 mg | ORAL_CAPSULE | Freq: Every day | ORAL | 5 refills | Status: DC
Start: 2023-02-23 — End: 2023-08-23

## 2023-02-23 MED ORDER — ESTRADIOL 0.1 MG/GM VA CREA: TOPICAL_CREAM | VAGINAL | 11 refills | Status: DC

## 2023-02-23 NOTE — Patient Instructions (Addendum)
Today we talked about ways to manage bladder urgency such as altering your diet to avoid irritative beverages and foods (bladder diet) as well as attempting to decrease stress and other exacerbating factors.    The Most Bothersome Foods* The Least Bothersome Foods*  Coffee - Regular & Decaf Tea - caffeinated Carbonated beverages - cola, non-colas, diet & caffeine-free Alcohols - Beer, Red Wine, [place] Wine, [address] - Grapefruit, [place], Orange, [employer] - Cranberry, Grapefruit, Orange, Pineapple Vegetables - Tomato & Tomato Products Flavor Enhancers - Hot peppers, Spicy foods, Chili, Horseradish, Vinegar, Monosodium glutamate (MSG) Artificial Sweeteners - NutraSweet, Sweet 'N Low, Equal (sweetener), Saccharin Ethnic foods - [place], [place], [place] food [employer] - low-fat & whole Fruits - Bananas, Blueberries, Honeydew melon, Pears, Raisins, Watermelon Vegetables - Broccoli, [address] Sprouts, [place], Carrots, Cauliflower, [place], Cucumber, Mushrooms, Peas, Radishes, Squash, Zucchini, [place] potatoes, Sweet potatoes & yams Poultry - Chicken, Eggs, [place], [employer] - Beef, Diplomatic Services operational officer, Lamb Seafood - Shrimp, [place] fish, Salmon Grains - Oat, Rice Snacks - Pretzels, Popcorn  *Lenward Chancellor et al. Diet and its role in interstitial cystitis/bladder pain syndrome (IC/BPS) and comorbid conditions. [employer]. BJU Int. 2012 Jan 11.   Accidental Bowel Leakage: Our goal is to achieve formed bowel movements daily or every-other-day without leakage.  You may need to try different combinations of the following options to find what works best for you.  Some management options include: Dietary changes (more leafy greens, vegetables and fruits; less processed foods) Fiber supplementation (Metamucil or something with psyllium as active ingredient) Over-the-counter imodium (tablets or liquid) to help solidify the stool and prevent leakage of stool.  We discussed the symptoms of overactive  bladder (OAB), which include urinary urgency, urinary frequency, night-time urination, with or without urge incontinence.  We discussed management including behavioral therapy (decreasing bladder irritants by following a bladder diet, urge suppression strategies, timed voids, bladder retraining), physical therapy, medication; and for refractory cases posterior tibial nerve stimulation, sacral neuromodulation, and intravesical botulinum toxin injection.   Start vaginal estrogen therapy nightly for two weeks then 2 times weekly at night for treatment of vaginal atrophy (dryness of the vaginal tissues) and prevention of urinary tract infections.   Please let us know if the prescription is too expensive and we can look for alternative options.   Start nitrofurantoin 100mg  daily to prevent urinary tract infections.   You have a stage 2 (out of 4) prolapse.  We discussed the fact that it is not life threatening but there are several treatment options. For treatment of pelvic organ prolapse, we discussed options for management including expectant management, conservative management, and surgical management, such as Kegels, a pessary, pelvic floor physical therapy, and specific surgical procedures.

## 2023-02-23 NOTE — Progress Notes (Signed)
[facility] New Patient Evaluation and Consultation  Referring Provider: Olive Bass,* PCP: Wanda Plump, MD Date of Service: 02/23/2023  SUBJECTIVE Chief Complaint: New Patient (Initial Visit) Cynthia Thornton is a 77 y.o. female is here for frequent UTI's and possible prolapse)  History of Present Illness: Cynthia Thornton is a 77 y.o. [place] or Caucasian female presenting for evaluation of incontinence.    Review of records significant for: 01/02/23- 10- 49,000 E. Coli 11/10/22- >100,000 E.Coli 08/30/22- >100,000 E.Coli 06/17/22- >100,000 E.Coli  Dr Seymour Bars noted grade 3 cystocele and pt was sent to physical therapy.   Urinary Symptoms: Leaks urine with with movement to the bathroom and with urgency Leaks a few time(s) per day- able to control it better if she if she empties better .  Pad use: 3 pads per day.   She is bothered by her UI symptoms. She is seeing Dr Marlou Porch at [facility]- tried Myrbetriq samples and did not work  Day time voids- every 30-45 min.  Nocturia: 1-2 times per night to void. Voiding dysfunction: she empties her bladder well.  does not use a catheter to empty bladder.  When urinating, she feels a weak stream Drinks: 1 soda in the morning and at lunch, unsweet tea, 1 bottle water, herbal tea per day  UTIs: 4 UTI's in the last year.  When she has UTIs, she has symptoms bladder pain, and worsening incontinence. She takes methenamine, vitamin C and cranberry. Has never tried vaginal estrogen.  Reports history of kidney stone about 35 years ago.   Pelvic Organ Prolapse Symptoms:                  She Admits to a feeling of a bulge the vaginal area. It has been present for several months She Admits to seeing a bulge.  This bulge is not bothersome. Has done PT and has kept up with the exercises and that has helped her symptoms.   Bowel Symptom: Bowel movements: 1 time(s) per day Stool consistency: soft  Straining: no.  Splinting: no.   Incomplete evacuation: no.  She Admits to accidental bowel leakage / fecal incontinence  Occurs: not in the last month- previously had some testing with GI and looser stools have resolved.   Consistency with leakage: loose.  Bowel regimen: diet, probiotic Last colonoscopy: Date 2020- internal hemorrhoids  Sexual Function Sexually active: no.    Pelvic Pain Denies pelvic pain  Past Medical History:  Past Medical History:  Diagnosis Date   Arthritis    Breast cancer, left [facility]) oncologist-- dr Pamelia Hoit   dx 09-26-2017--- IDC, Stage IA, Grade 2, ER/PR +;  HER2 negative;  s/p  breast lumpectomy w/ node dissection 11-02-2017;  completed radiation 01-09-2018 (left breast genetic screening panel 2017 with variant of unknown significance AXINA)   Bronchiectasis (HCC)    pulmologist-- dr Sandria Manly-- w/ chronic lingular scarring  (last exceratbation bronchiectasis 07/ 2020)   Dyslipidemia    followed by cardiology/ vascular-- dr g. adams ([facility] in [place])  family history strokes   Eczema    Family history of breast cancer    Family history of colon cancer    GERD (gastroesophageal reflux disease)    Hiatal hernia    History of esophageal stricture    s/p dilatation 2008   History of external beam radiation therapy    left breast 12-19-2017  to 01-09-2018   History of hyperthyroidism    endocrinologist-- (lov 04-11-2019 epic)  dr Everardo All, dx 2009 due to multinodular goiter ,  had taken medication for few months 2010 stopped due to norma TFT;     Insomnia    Multinodular goiter last thyroid ultrasound in epic 12-18-2018   endocrinologist-- dr Everardo All,  dx 2009 w/ hyperthroidism with medication until normal TFT in 2010;     Osteopenia 02/2018   T score -2.4 distal third of radius.  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   Wears contact lenses      Past Surgical History:   Past Surgical History:  Procedure Laterality Date   BREAST LUMPECTOMY WITH RADIOACTIVE SEED AND SENTINEL LYMPH NODE BIOPSY Left 11/02/2017   Procedure: LEFT BREAST LUMPECTOMY WITH RADIOACTIVE SEED AND LEFT AXILLARY DEEP SENTINEL LYMPH NODE BIOPSY, INJECT BLUE DYE LEFT BREAST;  Surgeon: Claud Kelp, MD;  Location: [facility];  Service: General;  Laterality: Left;   BREAST SURGERY  2000   Breast cyst removed, left   LAPAROSCOPIC BILATERAL SALPINGO OOPHERECTOMY Bilateral 10/13/2019   Procedure: LAPAROSCOPIC BILATERAL SALPINGO OOPHORECTOMY LYSIS OF ADHESIONS PERITONEAL WASHINGS ;  Surgeon: Genia Del, MD;  Location: [facility];  Service: Gynecology;  Laterality: Bilateral;  request 7:30am OR time in [place] Gyn block requests one hour   TONSILLECTOMY  age 46   TUBAL LIGATION Bilateral yrs ago   VAGINAL HYSTERECTOMY  1991   Right ovarian cystectomy   VIDEO BRONCHOSCOPY Bilateral 03/16/2015   Procedure: VIDEO BRONCHOSCOPY WITHOUT FLUORO;  Surgeon: Kalman Shan, MD;  Location: [facility];  Service: Endoscopy;  Laterality: Bilateral;     Past OB/GYN History: OB History  Gravida Para Term Preterm AB Living  3 2 2   1 2   SAB IAB Ectopic Multiple Live Births  1            # Outcome Date GA Lbr Len/2nd Weight Sex Delivery Anes PTL Lv  3 SAB           2 Term      Vag-Spont     1 Term      Vag-Spont      S/p hysterectomy and BSO   Medications: She has a current medication list which includes the following prescription(s): b-complex with vitamin c, cetirizine, vitamin d, clorazepate, cranberry, diazepam, dicyclomine, [START ON 02/26/2023] estradiol, famotidine, methenamine, methocarbamol,  nitrofurantoin (macrocrystal-monohydrate), nitrofurantoin (macrocrystal-monohydrate), omeprazole, ondansetron, probiotic product, and valacyclovir.   Allergies: Patient is allergic to doxycycline, other, oxycodone, sulfa antibiotics, trimethoprim sulfate [trimethoprim], codeine, and propranolol hcl.   Social History:  Social History   Tobacco Use   Smoking status: Former    Packs/day: 0.10    Years: 30.00    Additional pack years: 0.00    Total pack years: 3.00    Types: Cigarettes    Quit date: 10/31/1995    Years since quitting: 27.3   Smokeless tobacco: Never  Vaping Use   Vaping Use: Never used  Substance Use Topics   Alcohol use: Yes    Comment: occ   Drug use: No    Family History:   Family History  Problem Relation Age of Onset  Osteoporosis Mother    Lung cancer Mother    Stroke Mother    Diabetes Father    Hypertension Father    Osteoporosis Father    Colon cancer Father 87   Breast cancer Sister        Age 21   Leukemia Maternal Grandfather    Crohn's disease Son    Ovarian cancer Maternal Aunt 79   Breast cancer Maternal Aunt        great aunt- Age unknown   Colon cancer Paternal Uncle 64   Breast cancer Cousin        maternal-Age 81   Colon cancer Cousin        maternal first cousin     Review of Systems: Review of Systems  Constitutional:  Negative for fever, malaise/fatigue and weight loss.  Respiratory:  Positive for cough. Negative for shortness of breath and wheezing.   Cardiovascular:  Negative for chest pain, palpitations and leg swelling.  Gastrointestinal:  Negative for abdominal pain and blood in stool.  Genitourinary:  Negative for dysuria.  Musculoskeletal:  Negative for myalgias.  Skin:  Negative for rash.  Neurological:  Negative for dizziness and headaches.  Endo/Heme/Allergies:  Does not bruise/bleed easily.  Psychiatric/Behavioral:  Negative for depression. The patient is not nervous/anxious.      OBJECTIVE Physical  Exam: Vitals:   02/23/23 1504  BP: 130/76  Pulse: 68  Weight: 146 lb 6.4 oz (66.4 kg)  Height: 5' 2.72" (1.593 m)    Physical Exam Constitutional:      General: She is not in acute distress. Pulmonary:     Effort: Pulmonary effort is normal.  Abdominal:     General: There is no distension.     Palpations: Abdomen is soft.     Tenderness: There is no abdominal tenderness. There is no rebound.  Musculoskeletal:        General: No swelling. Normal range of motion.  Skin:    General: Skin is warm and dry.     Findings: No rash.  Neurological:     Mental Status: She is alert and oriented to person, place, and time.  Psychiatric:        Mood and Affect: Mood normal.        Behavior: Behavior normal.      GU / Detailed Urogynecologic Evaluation:  Pelvic Exam: Normal external female genitalia; Bartholin's and Skene's glands normal in appearance; urethral meatus normal in appearance, no urethral masses or discharge.   CST: negative  s/p hysterectomy: Speculum exam reveals normal vaginal mucosa with  atrophy and normal vaginal cuff.  Adnexa absent  Pelvic floor strength I/V, puborectalis II/V external anal sphincter II/V  Pelvic floor musculature: Right levator non-tender, Right obturator non-tender, Left levator non-tender, Left obturator non-tender  POP-Q:   POP-Q  0                                            Aa   0                                           Ba  -6.5  C   2                                            Gh  3                                            Pb  8                                            tvl   -2                                            Ap  -2                                            Bp                                                 D      Rectal Exam:  Normal sphincter tone, no distal rectocele, enterocoele not present, no rectal masses, noted dyssynergia when asking the patient  to bear down.  Post-Void Residual (PVR) by Bladder Scan: In order to evaluate bladder emptying, we discussed obtaining a postvoid residual and she agreed to this procedure.  Procedure: The ultrasound unit was placed on the patient's abdomen in the suprapubic region after the patient had voided. A PVR of 8 ml was obtained by bladder scan.  Laboratory Results: POC urine: small leukocytes   ASSESSMENT AND PLAN Ms. Herford is a 77 y.o. with:  1. Recurrent urinary tract infection   2. Leukocytes in urine   3. Overactive bladder   4. Urinary frequency   5. Prolapse of anterior vaginal wall   6. Incontinence of feces, unspecified fecal incontinence type    rUTI - For treatment of recurrent urinary tract infections, we discussed management of recurrent UTIs including prophylaxis with a daily low dose antibiotic, transvaginal estrogen therapy, D-mannose, and cranberry supplements.   - Prescribed macrobid 100mg  daily x 6 months for prophylaxis.  - We discussed the role of vaginal estrogen in depth and benefits over risk of breast cancer recurrence. She has an upcoming appt with her breast oncologist and will discuss estrogen use with him. Prescribed estrace cream 0.5g nightly for two weeks then twice a week after.  - She feels the cranberry has been expensive and is not helping prevent infections, ok to discontinue - continue with methenamine twice daily  2. OAB - We discussed the symptoms of overactive bladder (OAB), which include urinary urgency, urinary frequency, nocturia, with or without urge incontinence.  While we do not know the exact etiology of OAB, several treatment options exist. We discussed management including behavioral therapy (decreasing bladder irritants, urge suppression strategies, timed voids, bladder retraining), physical therapy,  medication; for refractory cases posterior tibial nerve stimulation, sacral neuromodulation, and intravesical botulinum toxin injection.  - She is  not interested in trying a medication. Wants to do PTNS. Will have her fill out a 3 day bladder diary and return for a procedure.  - recommended decreasing intake of bladder irritants and drinking more water  3. Stage II anterior, Stage I posterior, Stage I apical prolapse - For treatment of pelvic organ prolapse, we discussed options for management including expectant management, conservative management, and surgical management, such as Kegels, a pessary, pelvic floor physical therapy, and specific surgical procedures (anterior repair). - She feels that pelvic floor exercises have been helpful so far and does not want further treatment at this time.   4. Accidental Bowel Leakage:  - Treatment options include anti-diarrhea medication (loperamide/ Imodium OTC or prescription lomotil), fiber supplements, physical therapy, and possible sacral neuromodulation or surgery.   - recommended daily fiber supplementation with metamucil  Return for PTNS  Marguerita Beards, MD

## 2023-02-24 LAB — URINE CULTURE: Culture: NO GROWTH

## 2023-03-01 NOTE — Progress Notes (Signed)
03/01/2023 Cynthia Thornton [id number] [birth year]  Referring provider: Wanda Plump, MD Primary GI doctor: Dr. Rhea Belton  ASSESSMENT AND PLAN:   Loose stool/AB pain Normal CT AB Normal stool labs and labs No alarm symptoms Has had increased stress likely represents IBS, possible pelvic floor component.  IBGard samples Get squatty potty, talk with PT about pelvic floor (seeing someone now) Can consider SIBO testing Will follow up as needed or will call if any alarm symptoms  Benign neoplasm of colon, unspecified part of colon with family history of colon cancer 05/29/2019 colonoscopy with small internal hemorrhoids and otherwise normal.  Repeat recommended in 5 years given family history.  Gastroesophageal reflux disease without esophagitis Continue PPI daily No symptoms at this time  Patient Care Team: Wanda Plump, MD as PCP - General Ollen Gross, MD as Consulting Physician (Orthopedic Surgery) Serena Croissant, MD as Consulting Physician (Hematology and Oncology) Claud Kelp, MD as Consulting Physician (General Surgery) Kalman Shan, MD as Consulting Physician (Pulmonary Disease) Dorothy Puffer, MD as Consulting Physician (Radiation Oncology) Axel Filler, Larna Daughters, NP as Nurse Practitioner (Hematology and Oncology) Crist Fat, MD as Attending Physician (Urology) Christia Reading, MD as Consulting Physician (Otolaryngology) Sherrilyn Rist. (Inactive) (Cardiology) Genia Del, MD as Consulting Physician (Obstetrics and Gynecology)  HISTORY OF PRESENT ILLNESS: 77 y.o. female with a past medical history of hiatal hernia with GERD, history of esophageal stricture, personal history of colon polyps with family history of colon cancer in her father and uncle in 54s, bronchiectasis, anemia, left breast carcinoma 2018 and others listed below presents for evaluation of dark watery stools.   09/18/2012 EGD was normal. 05/29/2019 colonoscopy with small internal  hemorrhoids and otherwise normal.  Repeat recommended in 5 years given family history.  Recall 04/2024 12/29/2021 office visit with Cynthia Thornton for refill of PPI for GERD, on prilosec daily 40 mg since 2004.  11/13/2022 office visit with myself for diarrhea/change in bowel habits for 2 weeks frequent antibiotics GI profile stool negative, celiac negative, thyroid unremarkable, sed rate normal, C. difficile negative CBC without anemia or infection hyponatremia 133, slightly elevated BUN 27 Patient started on align. 01/01/2023 called stating she had worsening symptoms.   Started on [facility] 01/02/2023 01/12/2023 office visit with myself for diarrhea/abdominal pain given dicyclomine possible treatment for SIBO versus repeating colonoscopy sooner 01/29/23 CT abdomen pelvis with contrast for abdominal pain showed no acute intra-abdominal or pelvic pathology, moderate stool burden no bowel obstruction, normal appendix.   Given samples of Linzess 72 versus MiraLAX with Benefiber  Patient presents for follow-up. She just lost her best friend suddenly and is tearful.  She has loose stools 2-3 x in the morning but states pain is improved.  Dicyclomine helps but makes her tired.  No evidence for GI bleeding, iron deficiency anemia. Denies unexplained weight loss.  No GERD, nausea, vomiting.    Wt Readings from Last 4 Encounters:  02/23/23 146 lb 6.4 oz (66.4 kg)  01/12/23 144 lb 2 oz (65.4 kg)  01/02/23 145 lb 8 oz (66 kg)  11/13/22 146 lb 2 oz (66.3 kg)     She  reports that she quit smoking about 27 years ago. Her smoking use included cigarettes. She has a 3.00 pack-year smoking history. She has never used smokeless tobacco. She reports current alcohol use. She reports that she does not use drugs.  Current Medications:     Current Outpatient Medications (Respiratory):    cetirizine (ZYRTEC) 10 MG tablet, Take 10  mg by mouth daily.    Current Outpatient Medications (Other):    B Complex-C  (B-COMPLEX WITH VITAMIN C) tablet, Take 1 tablet by mouth daily.   Cholecalciferol (VITAMIN D) 2000 units CAPS, Take 2,000 Units by mouth daily.   clorazepate (TRANXENE) 7.5 MG tablet, Take 1 tablet (7.5 mg total) by mouth at bedtime as needed for anxiety.   CRANBERRY PO, Take 2 tablets by mouth in the morning and at bedtime.   diazepam (VALIUM) 2 MG tablet, Take 2 mg by mouth every 6 (six) hours as needed.   dicyclomine (BENTYL) 20 MG tablet, TAKE 1 TABLET (20 MG TOTAL) BY MOUTH 3 (THREE) TIMES DAILY AS NEEDED FOR SPASMS.   estradiol (ESTRACE) 0.1 MG/GM vaginal cream, Place 0.5g nightly for two weeks then twice a week after   famotidine (PEPCID) 20 MG tablet, Take 1 tablet (20 mg total) by mouth daily.   methenamine (HIPREX) 1 g tablet, Take 1 g by mouth 2 (two) times daily with a meal. Take with 500mg  of vitamin c w/ each tablet   methocarbamol (ROBAXIN) 500 MG tablet, Take 500 mg by mouth every 8 (eight) hours as needed.   nitrofurantoin, macrocrystal-monohydrate, (MACROBID) 100 MG capsule, Take 1 capsule (100 mg total) by mouth 2 (two) times daily.   nitrofurantoin, macrocrystal-monohydrate, (MACROBID) 100 MG capsule, Take 1 capsule (100 mg total) by mouth daily.   omeprazole (PRILOSEC) 40 MG capsule, TAKE 1 CAPSULE (40 MG TOTAL) BY MOUTH DAILY.   ondansetron (ZOFRAN-ODT) 4 MG disintegrating tablet, TAKE 1 TABLET BY MOUTH EVERY 8 HOURS AS NEEDED FOR NAUSEA OR VOMITING   Probiotic Product (PROBIOTIC PO), Take by mouth.   valACYclovir (VALTREX) 500 MG tablet, Take 1 tablet (500 mg total) by mouth daily. 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  Medical History:  Past Medical History:  Diagnosis Date   Arthritis    Breast cancer, left [facility]) oncologist-- dr Pamelia Hoit   dx 09-26-2017--- IDC, Stage IA, Grade 2, ER/PR +;  HER2 negative;  s/p  breast lumpectomy w/ node dissection 11-02-2017;  completed radiation 01-09-2018 (left breast genetic screening panel 2017 with variant of unknown significance AXINA)    Bronchiectasis (HCC)    pulmologist-- dr Sandria Manly-- w/ chronic lingular scarring  (last exceratbation bronchiectasis 07/ 2020)   Dyslipidemia    followed by cardiology/ vascular-- dr g. adams ([facility] in [place])  family history strokes   Eczema    Family history of breast cancer    Family history of colon cancer    GERD (gastroesophageal reflux disease)    Hiatal hernia    History of esophageal stricture    s/p dilatation 2008   History of external beam radiation therapy    left breast 12-19-2017  to 01-09-2018   History of hyperthyroidism    endocrinologist-- (lov 04-11-2019 epic) dr Everardo All, dx 2009 due to multinodular goiter ,  had taken medication for few months 2010 stopped due to norma TFT;     Insomnia    Multinodular goiter last thyroid ultrasound in epic 12-18-2018   endocrinologist-- dr Everardo All,  dx 2009 w/ hyperthroidism with medication until normal TFT in 2010;     Osteopenia 02/2018   T score -2.4 distal third of radius.  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   Wears contact lenses    Allergies:  Allergies  Allergen Reactions   Doxycycline Rash   Other Other (See Comments)    Surgical glue : hives   Oxycodone Nausea And Vomiting   Sulfa Antibiotics Hives   Trimethoprim Sulfate [Trimethoprim]     rash   Codeine Nausea Only   Propranolol Hcl Other (See Comments)    "Dizziness and heart racing"     Surgical History:  She  has a past surgical history that includes Video bronchoscopy (Bilateral, 03/16/2015); Breast lumpectomy with radioactive seed and sentinel lymph node biopsy (Left, 11/02/2017); Breast  surgery (2000); Vaginal hysterectomy (1991); Tonsillectomy (age 5); Tubal ligation (Bilateral, yrs ago); and Laparoscopic bilateral salpingo oophorectomy (Bilateral, 10/13/2019). Family History:  Her family history includes Breast cancer in her cousin, maternal aunt, and sister; Colon cancer in her cousin; Colon cancer (age of onset: 67) in her paternal uncle; Colon cancer (age of onset: 43) in her father; Crohn's disease in her son; Diabetes in her father; Hypertension in her father; Leukemia in her maternal grandfather; Lung cancer in her mother; Osteoporosis in her father and mother; Ovarian cancer (age of onset: 51) in her maternal aunt; Stroke in her mother.  REVIEW OF SYSTEMS  : All other systems reviewed and negative except where noted in the History of Present Illness.  PHYSICAL EXAM: There were no vitals taken for this visit. General:   Pleasant, well developed female in no acute distress Head:   Normocephalic and atraumatic. Eyes:  sclerae anicteric,conjunctive pink  Heart:   regular rate and rhythm Pulm:  Clear anteriorly; no wheezing Abdomen:   Soft, Non-distended AB, Active bowel sounds. No  tenderness . Without guarding and Without rebound, No organomegaly appreciated. Rectal: Not evaluated Extremities:  Without edema. Msk: Symmetrical without gross deformities. Peripheral pulses intact.  Neurologic:  Alert and  oriented x4;  No focal deficits.  Skin:   Dry and intact without significant lesions or rashes. Psychiatric:  Cooperative. Normal mood and affect.  RELEVANT LABS AND IMAGING: CBC    Component Value Date/Time   WBC 7.3 01/12/2023 1024   RBC 3.83 (L) 01/12/2023 1024   HGB 12.7 01/12/2023 1024   HGB 13.0 10/03/2017 0837   HCT 36.6 01/12/2023 1024   HCT 37.6 10/03/2017 0837   PLT 339.0 01/12/2023 1024   PLT 305 10/03/2017 0837   MCV 95.6 01/12/2023 1024   MCV 88.3 10/03/2017 0837   MCH 30.2 08/13/2020 0957   MCHC 34.6 01/12/2023 1024   RDW 13.6 01/12/2023 1024    RDW 13.4 10/03/2017 0837   LYMPHSABS 1.9 01/12/2023 1024   LYMPHSABS 1.5 10/03/2017 0837   MONOABS 0.8 01/12/2023 1024   MONOABS 0.5 10/03/2017 0837   EOSABS 0.1 01/12/2023 1024   EOSABS 0.2 10/03/2017 0837   BASOSABS 0.1 01/12/2023 1024   BASOSABS 0.1 10/03/2017 0837    CMP     Component Value Date/Time   NA 134 (L) 01/12/2023 1024   NA 136 (A) 10/09/2022 0000   NA 134 (L) 10/03/2017 0837   K 4.2 01/12/2023 1024   K 4.3 10/03/2017 0837   CL 104 01/12/2023 1024   CO2 22 01/12/2023 1024   CO2 23 10/03/2017 0837   GLUCOSE 88 01/12/2023 1024   GLUCOSE 90 10/03/2017 0837   BUN 32 (H) 01/12/2023 1024   BUN 16  10/09/2022 0000   BUN 24.3 10/03/2017 0837   CREATININE 0.93 01/12/2023 1024   CREATININE 0.93 08/13/2020 0957   CREATININE 1.0 10/03/2017 0837   CALCIUM 9.5 01/12/2023 1024   CALCIUM 9.7 10/03/2017 0837   PROT 7.0 01/12/2023 1024   PROT 7.7 10/03/2017 0837   ALBUMIN 4.3 01/12/2023 1024   ALBUMIN 4.7 10/03/2017 0837   AST 15 01/12/2023 1024   AST 21 10/03/2017 0837   ALT 12 01/12/2023 1024   ALT 17 10/03/2017 0837   ALKPHOS 86 01/12/2023 1024   ALKPHOS 107 10/03/2017 0837   BILITOT 0.3 01/12/2023 1024   BILITOT 0.44 10/03/2017 0837   GFRNONAA >60 05/18/2020 1147   GFRAA >60 05/18/2020 1147     Doree Albee, PA-C 8:01 AM

## 2023-03-02 ENCOUNTER — Encounter: Payer: Self-pay | Admitting: Physician Assistant

## 2023-03-02 ENCOUNTER — Ambulatory Visit: Payer: Medicare PPO | Admitting: Physician Assistant

## 2023-03-02 VITALS — BP 98/60 | HR 80 | Ht 63.0 in | Wt 147.2 lb

## 2023-03-02 DIAGNOSIS — R1084 Generalized abdominal pain: Secondary | ICD-10-CM

## 2023-03-02 DIAGNOSIS — K219 Gastro-esophageal reflux disease without esophagitis: Secondary | ICD-10-CM

## 2023-03-02 DIAGNOSIS — D126 Benign neoplasm of colon, unspecified: Secondary | ICD-10-CM

## 2023-03-02 DIAGNOSIS — R195 Other fecal abnormalities: Secondary | ICD-10-CM | POA: Diagnosis not present

## 2023-03-02 NOTE — Patient Instructions (Addendum)
Here some information about pelvic floor dysfunction. This may be contributing to some of your symptoms. We will continue with our evaluation but I do want you to consider adding on fiber supplement with low-dose MiraLAX daily. DISCUSS WITH YOUR SCOLIOSIS DOCTOR ABOUT PELVIC FLOOR  CONSIDER GOING BACK TO PELVIC FLOOR FOR CONSTIPATION  GET STOOL OR SQUATTY POTTY TO SEE IF THIS HELPS  First do a trial off milk/lactose products if you use them.  Can do trial of IBGard which is over the counter for AB pain- Take 1-2 capsules once a day for maintence or twice a day during a flare Can send in an anti spasm medication, Bentyl, to take as needed Please try to decrease stress. consider talking with PCP about anti anxiety medication or try head space app for meditation. if any worsening symptoms like blood in stool, weight loss, please call the office   Please try low FODMAP diet- see below- start with eliminating just one column at a time, the table at the very bottom contains foods that are safe to take   FODMAP stands for fermentable oligo-, di-, mono-saccharides and polyols (1). These are the scientific terms used to classify groups of carbs that are notorious for triggering digestive symptoms like bloating, gas and stomach pain.      We may want to evaluate you for small intestinal bacterial overgrowth, this can cause increase gas, bloating, loose stools or constipation.  There is a test for this we can do or sometimes we will treat a patient with an antibiotic to see if it helps.     Pelvic Floor Dysfunction, Female Pelvic floor dysfunction (PFD) is a condition that results when the group of muscles and connective tissues that support the organs in the pelvis (pelvic floor muscles) do not work well. These muscles and their connections form a sling that supports the colon and bladder. In women, they also support the uterus. PFD causes pelvic floor muscles to be too weak, too tight, or both. In  PFD, muscle movements are not coordinated. This may cause bowel or bladder problems. It may also cause pain. What are the causes? This condition may be caused by an injury to the pelvic area or by a weakening of pelvic muscles. This often results from pregnancy and childbirth or other types of strain. In many cases, the exact cause is not known. What increases the risk? The following factors may make you more likely to develop this condition: Having chronic bladder tissue inflammation (interstitial cystitis). Being an older person. Being overweight. History of radiation treatment for cancer in the pelvic region. Previous pelvic surgery, such as removal of the uterus (hysterectomy). What are the signs or symptoms? Symptoms of this condition vary and may include: Bladder symptoms, such as: Trouble starting urination and emptying the bladder. Frequent urinary tract infections. Leaking urine when coughing, laughing, or exercising (stress incontinence). Having to pass urine urgently or frequently. Pain when passing urine. Bowel symptoms, such as: Constipation. Urgent or frequent bowel movements. Incomplete bowel movements. Painful bowel movements. Leaking stool or gas. Unexplained genital or rectal pain. Genital or rectal muscle spasms. Low back pain. Other symptoms may include: A heavy, full, or aching feeling in the vagina. A bulge that protrudes into the vagina. Pain during or after sex. How is this diagnosed? This condition may be diagnosed based on: Your symptoms and medical history. A physical exam. During the exam, your health care provider may check your pelvic muscles for tightness, spasm, pain, or weakness.  This may include a rectal exam and a pelvic exam. In some cases, you may have diagnostic tests, such as: Electrical muscle function tests. Urine flow testing. X-ray tests of bowel function. Ultrasound of the pelvic organs. How is this treated? Treatment for this  condition depends on the symptoms. Treatment options include: Physical therapy. This may include Kegel exercises to help relax or strengthen the pelvic floor muscles. Biofeedback. This type of therapy provides feedback on how tight your pelvic floor muscles are so that you can learn to control them. Internal or external massage therapy. A treatment that involves electrical stimulation of the pelvic floor muscles to help control pain (transcutaneous electrical nerve stimulation, or TENS). Sound wave therapy (ultrasound) to reduce muscle spasms. Medicines, such as: Muscle relaxants. Bladder control medicines. Surgery to reconstruct or support pelvic floor muscles may be an option if other treatments do not help. Follow these instructions at home: Activity Do your usual activities as told by your health care provider. Ask your health care provider if you should modify any activities. Do pelvic floor strengthening or relaxing exercises at home as told by your physical therapist. Lifestyle Maintain a healthy weight. Eat foods that are high in fiber, such as beans, whole grains, and fresh fruits and vegetables. Limit foods that are high in fat and processed sugars, such as fried or sweet foods. Manage stress with relaxation techniques such as yoga or meditation. General instructions If you have problems with leakage: Use absorbable pads or wear padded underwear. Wash frequently with mild soap. Keep your genital and anal area as clean and dry as possible. Ask your health care provider if you should try a barrier cream to prevent skin irritation. Take warm baths to relieve pelvic muscle tension or spasms. Take over-the-counter and prescription medicines only as told by your health care provider. Keep all follow-up visits. How is this prevented? The cause of PFD is not always known, but there are a few things you can do to reduce the risk of developing this condition, including: Staying at a  healthy weight. Getting regular exercise. Managing stress. Contact a health care provider if: Your symptoms are not improving with home care. You have signs or symptoms of PFD that get worse at home. You develop new signs or symptoms. You have signs of a urinary tract infection, such as: Fever. Chills. Increased urinary frequency. A burning feeling when urinating. You have not had a bowel movement in 3 days (constipation). Summary Pelvic floor dysfunction results when the muscles and connective tissues in your pelvic floor do not work well. These muscles and their connections form a sling that supports your colon and bladder. In women, they also support the uterus. PFD may be caused by an injury to the pelvic area or by a weakening of pelvic muscles. PFD causes pelvic floor muscles to be too weak, too tight, or a combination of both. Symptoms may vary from person to person. In most cases, PFD can be treated with physical therapies and medicines. Surgery may be an option if other treatments do not help. This information is not intended to replace advice given to you by your health care provider. Make sure you discuss any questions you have with your health care provider. Document Revised: 02/23/2021 Document Reviewed: 02/23/2021 Elsevier Patient Education  2022 Elsevier Inc.  Dupuytren's Contracture Dupuytren's contracture is a condition in which tissue under the skin of the palm becomes thick. This causes one or more of the fingers to curl inward (contract) toward  the palm. After a while, the fingers may not be able to straighten out. This condition affects some or all of the fingers and the palm of the hand. This condition may affect one or both hands. Dupuytren's contracture is a long-term (chronic) condition that develops (progresses) slowly over time. There is no cure, but symptoms can be managed and progression can be slowed with treatment. This condition is usually not dangerous or  painful, but it can interfere with everyday tasks. What are the causes?  This condition is caused by tissue (fascia) in the palm that gets thicker and tighter. When the fascia thickens, it pulls on the cords of tissue (tendons) that control finger movement. This causes the fingers to contract. The cause of fascia thickening is not known. However, the condition is often passed along from parent to child (inherited). What increases the risk? The following factors may make you more likely to develop this condition: Being 33 years of age or older. Being female. Having a family history of this condition. Using tobacco products, including cigarettes, chewing tobacco, and e-cigarettes. Drinking alcohol excessively. Having diabetes. Having a seizure disorder. What are the signs or symptoms? Early symptoms of this condition may include: Thick, puckered skin on the hand. One or more lumps (nodules) on the palm. Nodules may be tender when they first appear, but they are generally painless. Later symptoms of this condition may include: Thick cords of tissue in the palm. Fingers curled up toward the palm. Inability to straighten the fingers into their normal position. Though this condition is usually painless, you may have discomfort when holding or grabbing objects. How is this diagnosed? This condition is diagnosed with a physical exam, which may include: Looking at your hands and feeling your palms. This is to check for thickened fascia and nodules. Measuring finger motion. Doing the [place] tabletop test. You may be asked to try to put your hand on a surface, with your palm down and your fingers straight out. How is this treated? There is no cure for this condition, but treatment can relieve discomfort and make symptoms more manageable. Treatment options may include: Physical therapy. This can strengthen your hand and increase flexibility. Occupational therapy. This can help you with everyday  tasks that may be more difficult because of your condition. Shots (injections). Substances may be injected into your hand, such as: Medicines that help to decrease swelling (corticosteroids). Proteins (collagenase) to weaken thick tissue. After a collagenase injection, your health care provider may stretch your fingers. Needle aponeurotomy. A needle is pushed through the skin and into the fascia. Moving the needle against the fascia can weaken or break up the thick tissue. Surgery. This may be needed if your condition causes discomfort or interferes with everyday activities. Physical therapy is usually needed after surgery. No treatment is guaranteed to cure this condition. Recurrence of symptoms is common. Follow these instructions at home: Hand care Take these actions to help protect your hand from possible injury: Use tools that have padded grips. Wear protective gloves while you work with your hands. Avoid repetitive hand movements. General instructions Take over-the-counter and prescription medicines only as told by your health care provider. Manage any other conditions that you have, such as diabetes. If physical therapy was prescribed, do exercises as told by your health care provider. Do not use any products that contain nicotine or tobacco, such as cigarettes, e-cigarettes, and chewing tobacco. If you need help quitting, ask your health care provider. If you drink alcohol: Limit  how much you have to: 0-1 drink a day for women who are not pregnant. 0-2 drinks a day for men. Be aware of how much alcohol is in your drink. In the [place] one drink equals one 12 oz bottle of beer (355 mL), one 5 oz glass of wine (148 mL), or one 1 oz glass of hard liquor (44 mL). Keep all follow-up visits as told by your health care provider. This is important. Contact a health care provider if: You develop new symptoms, or your symptoms get worse. You have pain that gets worse or does not get better  with medicine. You have difficulty or discomfort with everyday tasks. You develop numbness or tingling. Get help right away if: You have severe pain. Your fingers change color or become unusually cold. Summary Dupuytren's contracture is a condition in which tissue under the skin of the palm becomes thick. This condition is caused by tissue (fascia) that thickens. When it thickens, it pulls on the cords of tissue (tendons) that control finger movement and makes the fingers contract. You are more likely to develop this condition if you are a man, are over 22 years of age, have a family history of the condition, and drink a lot of alcohol. This condition can be treated with physical and occupational therapy, injections, and surgery. Follow instructions about how to care for your hand. Get help right away if you have severe pain or your fingers change color or become cold. This information is not intended to replace advice given to you by your health care provider. Make sure you discuss any questions you have with your health care provider. Document Revised: 01/25/2021 Document Reviewed: 01/25/2021 Elsevier Patient Education  2023 [employer].

## 2023-03-09 ENCOUNTER — Other Ambulatory Visit: Payer: Self-pay | Admitting: Physician Assistant

## 2023-03-15 DIAGNOSIS — M5459 Other low back pain: Secondary | ICD-10-CM | POA: Diagnosis not present

## 2023-03-15 DIAGNOSIS — M4125 Other idiopathic scoliosis, thoracolumbar region: Secondary | ICD-10-CM | POA: Diagnosis not present

## 2023-03-15 DIAGNOSIS — M4312 Spondylolisthesis, cervical region: Secondary | ICD-10-CM | POA: Diagnosis not present

## 2023-03-16 ENCOUNTER — Encounter: Payer: Self-pay | Admitting: Internal Medicine

## 2023-03-16 ENCOUNTER — Ambulatory Visit (INDEPENDENT_AMBULATORY_CARE_PROVIDER_SITE_OTHER): Payer: Medicare PPO | Admitting: Internal Medicine

## 2023-03-16 VITALS — BP 116/68 | HR 72 | Temp 97.6°F | Resp 16 | Ht 63.0 in | Wt 147.2 lb

## 2023-03-16 DIAGNOSIS — Z0000 Encounter for general adult medical examination without abnormal findings: Secondary | ICD-10-CM | POA: Diagnosis not present

## 2023-03-16 DIAGNOSIS — E7800 Pure hypercholesterolemia, unspecified: Secondary | ICD-10-CM | POA: Diagnosis not present

## 2023-03-16 DIAGNOSIS — E079 Disorder of thyroid, unspecified: Secondary | ICD-10-CM

## 2023-03-16 NOTE — Assessment & Plan Note (Signed)
-   Td 2020 - zostavax 2010.  S/p Shingrix.  S/p RSV per patient - prevanr 2014; pnm   : 07-2015; had a PNM 20 @ the pharmacy 2023 per pt  - vax I rec:  flu shot q fall, COVID booster if not done after 06-2022 -CCS: Last colonoscopy 08-2015, + polyps, cscope 04/2019, next 5 years per GI.  Female care:  --Sees gyn, s/p BSO  09/2019 (d/t h/o breast ca) ---breast ca dx 08-2017, MMG 09/2022  ([facility]) -  Labs reviewed, will get  Lipid panel TSH free T4 - DEXA per gyn, takes Vit D - Healthcare POA: Recommend to bring a copy

## 2023-03-16 NOTE — Patient Instructions (Addendum)
Vaccines I recommend: Covid booster if not done after 06-2022 Flu shot every fall  Please bring [place] a copy of your Healthcare Power of Attorney for your chart.   Check the  blood pressure regularly BP GOAL is between 110/65 and  135/85. If it is consistently higher or lower, let me know    GO TO THE FRONT DESK, PLEASE SCHEDULE YOUR APPOINTMENTS Come back for   blood work at your convenience fasting  Come back for physical exam in 1 year

## 2023-03-16 NOTE — Progress Notes (Signed)
Subjective:    Patient ID: Cynthia Thornton, female    DOB: 11/13/1945, 77 y.o.   MRN: [id number]  DOS:  03/16/2023 Type of visit - description: cpx  Here for CPX. Saw GI and urogynecology.  Notes reviewed. In general is feeling well.  GI symptoms much improved GU symptoms better than baseline. History of bronchiectasis, no cough.  Review of Systems  Other than above, a 14 point review of systems is negative    Past Medical History:  Diagnosis Date   Arthritis    Breast cancer, left [facility]) oncologist-- dr Pamelia Hoit   dx 09-26-2017--- IDC, Stage IA, Grade 2, ER/PR +;  HER2 negative;  s/p  breast lumpectomy w/ node dissection 11-02-2017;  completed radiation 01-09-2018 (left breast genetic screening panel 2017 with variant of unknown significance AXINA)   Bronchiectasis (HCC)    pulmologist-- dr Sandria Manly-- w/ chronic lingular scarring  (last exceratbation bronchiectasis 07/ 2020)   Dyslipidemia    followed by cardiology/ vascular-- dr g. adams ([facility] in [place])  family history strokes   Eczema    Family history of breast cancer    Family history of colon cancer    GERD (gastroesophageal reflux disease)    Hiatal hernia    History of esophageal stricture    s/p dilatation 2008   History of external beam radiation therapy    left breast 12-19-2017  to 01-09-2018   History of hyperthyroidism    endocrinologist-- (lov 04-11-2019 epic) dr Everardo All, dx 2009 due to multinodular goiter ,  had taken medication for few months 2010 stopped due to norma TFT;     Insomnia    Multinodular goiter last thyroid ultrasound in epic 12-18-2018   endocrinologist-- dr Everardo All,  dx 2009 w/ hyperthroidism with medication until normal TFT in 2010;     Osteopenia 02/2018   T score -2.4 distal third of radius.  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   Wears contact lenses     Past Surgical History:  Procedure Laterality Date   BREAST LUMPECTOMY WITH RADIOACTIVE SEED AND SENTINEL LYMPH NODE BIOPSY Left 11/02/2017   Procedure: LEFT BREAST LUMPECTOMY WITH RADIOACTIVE SEED AND LEFT AXILLARY DEEP SENTINEL LYMPH NODE BIOPSY, INJECT BLUE DYE LEFT BREAST;  Surgeon: Claud Kelp, MD;  Location: [facility];  Service: General;  Laterality: Left;   BREAST SURGERY  2000   Breast cyst removed, left   LAPAROSCOPIC BILATERAL SALPINGO OOPHERECTOMY Bilateral 10/13/2019   Procedure: LAPAROSCOPIC BILATERAL SALPINGO OOPHORECTOMY LYSIS OF ADHESIONS PERITONEAL WASHINGS ;  Surgeon: Genia Del, MD;  Location: [facility];  Service: Gynecology;  Laterality: Bilateral;  request 7:30am OR time in [place] Gyn block requests one hour   TONSILLECTOMY  age 50   TUBAL LIGATION Bilateral yrs ago   VAGINAL HYSTERECTOMY  1991   Right ovarian cystectomy   VIDEO BRONCHOSCOPY Bilateral 03/16/2015   Procedure: VIDEO BRONCHOSCOPY WITHOUT FLUORO;  Surgeon: Kalman Shan, MD;  Location: [facility];  Service: Endoscopy;  Laterality: Bilateral;   Social History   Socioeconomic History   Marital status: Single  Spouse name: Not on file   Number of children: 2   Years of education: Not on file   Highest education level: Not on file  Occupational History   Occupation: Runner, broadcasting/film/video, works part time     Employer: [employer]  Tobacco Use   Smoking status: Former    Packs/day: 0.10    Years: 30.00    Additional pack years: 0.00    Total pack years: 3.00    Types: Cigarettes    Quit date: 10/31/1995    Years since quitting: 27.3   Smokeless tobacco: Never  Vaping Use   Vaping Use: Never used  Substance and Sexual  Activity   Alcohol use: Yes    Comment: occ   Drug use: No   Sexual activity: Not Currently    Birth control/protection: Surgical    Comment: 1st intercourse 77 yo-Fewer than 5 partners, hysterectomt  Other Topics Concern   Not on file  Social History Narrative   Lives by herself   2 children, one has mental issues    Social Determinants of Health   Financial Resource Strain: Low Risk  (06/26/2022)   Overall Financial Resource Strain (CARDIA)    Difficulty of Paying Living Expenses: Not hard at all  Food Insecurity: No Food Insecurity (06/26/2022)   Hunger Vital Sign    Worried About Running Out of Food in the Last Year: Never true    Ran Out of Food in the Last Year: Never true  Transportation Needs: No Transportation Needs (06/26/2022)   PRAPARE - Administrator, Civil Service (Medical): No    Lack of Transportation (Non-Medical): No  Physical Activity: Inactive (06/26/2022)   Exercise Vital Sign    Days of Exercise per Week: 0 days    Minutes of Exercise per Session: 0 min  Stress: No Stress Concern Present (06/26/2022)   [employer] of Occupational Health - Occupational Stress Questionnaire    Feeling of Stress : Not at all  Social Connections: Moderately Integrated (01/06/2021)   Social Connection and Isolation Panel [NHANES]    Frequency of Communication with Friends and Family: More than three times a week    Frequency of Social Gatherings with Friends and Family: More than three times a week    Attends Religious Services: More than 4 times per year    Active Member of [employer] or Organizations: Yes    Attends Banker Meetings: More than 4 times per year    Marital Status: Never married  Intimate Partner Violence: Not At Risk (06/26/2022)   Humiliation, Afraid, Rape, and Kick questionnaire    Fear of Current or Ex-Partner: No    Emotionally Abused: No    Physically Abused: No    Sexually Abused: No     Current Outpatient Medications   Medication Instructions   B Complex-C (B-COMPLEX WITH VITAMIN C) tablet 1 tablet, Oral, Daily   cetirizine (ZYRTEC) 10 mg, Oral, Daily   clorazepate (TRANXENE) 7.5 mg, Oral, At bedtime PRN   CRANBERRY PO 2 tablets, Oral, 2 times daily   diazepam (VALIUM) 2 mg, Oral, Every 6 hours PRN   dicyclomine (BENTYL) 20 mg, Oral, 3 times daily PRN   estradiol (ESTRACE) 0.1 MG/GM vaginal cream Place 0.5g nightly for two weeks then twice a week after   famotidine (PEPCID) 20 mg, Oral, Daily   methenamine (HIPREX) 1 g, Oral, 2 times daily with meals, Take with 500mg  of vitamin c w/ each tablet  methocarbamol (ROBAXIN) 500 mg, Oral, Every 8 hours PRN   nitrofurantoin (macrocrystal-monohydrate) (MACROBID) 100 mg, Oral, Daily   omeprazole (PRILOSEC) 40 mg, Oral, Daily   ondansetron (ZOFRAN-ODT) 4 mg, Oral, Every 8 hours PRN   Probiotic Product (PROBIOTIC PO) Oral   valACYclovir (VALTREX) 500 mg, Oral, Daily, 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   Vitamin D 2,000 Units, Oral, Daily       Objective:   Physical Exam BP 116/68   Pulse 72   Temp 97.6 F (36.4 C) (Oral)   Resp 16   Ht 5\' 3"  (1.6 m)   Wt 147 lb 4 oz (66.8 kg)   SpO2 97%   BMI 26.08 kg/m  General: Well developed, NAD, BMI noted Neck: No  thyromegaly  HEENT:  Normocephalic . Face symmetric, atraumatic Lungs:  CTA B Normal respiratory effort, no intercostal retractions, no accessory muscle use. Heart: RRR,  no murmur.  Abdomen:  Not distended, soft, non-tender. No rebound or rigidity.   Lower extremities: no pretibial edema bilaterally  Skin: Exposed areas without rash. Not pale. Not jaundice Neurologic:  alert & oriented X3.  Speech normal, gait appropriate for age and unassisted Strength symmetric and appropriate for age.  Psych: Cognition and judgment appear intact.  Cooperative with normal attention span and concentration.  Behavior appropriate. No anxious or depressed appearing.     Assessment    ASSESSMENT Bronchiectasis  Dr Marchelle Gearing Anxiety - Insomnia : tranxene   At hs, rx by gyn, previously, I'll Rx if needed  Endocrinology Dr. Everardo All q 3 years, Last visit 03/2015 --H/o thyrotoxicosis on remission --Goiter   Osteopenia --T score -1.5  (2015), dexa 02-2016 , dexa 03/19/2018 (at gyn) DJD -- [facility]: GERD, HH and esophageal stricture EGD 2007, colon polyps Breast cancer, DX 08-2017, s/p lumpectomy,  XRT, B salpingo-oophorectomy Recurrent UTIs/OAB., onset ~ 2018.  Saw urogynecology [phone]   PLAN Here for CPX, see separate documentation. Chronic medical problems seems to be controlled. H/o goiter and thyrotoxicosis: Primary endocrinologist is not available, last ultrasound stable on 02/16/2022.  Had ultrasound every 2 to 3 years.  Will check TFTs today, [place] in 1 year.  Offered a Endo referral- declined. Saw GI for loose stools and stomach pain, Dx possibly IBS, pelvic floor component. Recurrent UTI, overactive bladder. Saw urogynecology for recurrent UTIs.  On 02/23/2023, Rx Macrobid daily x 6 months and vag estrace   They also discussed overactive bladder. Social: Lives alone, still active, works. RTC 1 year.

## 2023-03-16 NOTE — Assessment & Plan Note (Signed)
Here for CPX, see separate documentation. Chronic medical problems seems to be controlled. H/o goiter and thyrotoxicosis: Primary endocrinologist is not available, last ultrasound stable on 02/16/2022.  Had ultrasound every 2 to 3 years.  Will check TFTs today, [place] in 1 year.  Offered a Endo referral- declined. Saw GI for loose stools and stomach pain, Dx possibly IBS, pelvic floor component. Recurrent UTI, overactive bladder. Saw urogynecology for recurrent UTIs.  On 02/23/2023, Rx Macrobid daily x 6 months and vag estrace   They also discussed overactive bladder. Social: Lives alone, still active, works. RTC 1 year.

## 2023-03-23 ENCOUNTER — Other Ambulatory Visit (INDEPENDENT_AMBULATORY_CARE_PROVIDER_SITE_OTHER): Payer: Medicare PPO

## 2023-03-23 DIAGNOSIS — E079 Disorder of thyroid, unspecified: Secondary | ICD-10-CM | POA: Diagnosis not present

## 2023-03-23 DIAGNOSIS — E785 Hyperlipidemia, unspecified: Secondary | ICD-10-CM | POA: Diagnosis not present

## 2023-03-23 DIAGNOSIS — E7800 Pure hypercholesterolemia, unspecified: Secondary | ICD-10-CM | POA: Diagnosis not present

## 2023-03-23 LAB — T4, FREE: Free T4: 0.92 ng/dL (ref 0.60–1.60)

## 2023-03-23 LAB — LIPID PANEL
Cholesterol: 206 mg/dL — ABNORMAL HIGH (ref 0–200)
HDL: 87.4 mg/dL (ref >39.00)
LDL Cholesterol: 104 mg/dL — ABNORMAL HIGH (ref 0–99)
NonHDL: 118.9
Total CHOL/HDL Ratio: 2
Triglycerides: 75 mg/dL (ref 0.0–149.0)
VLDL: 15 mg/dL (ref 0.0–40.0)

## 2023-03-23 LAB — TSH: TSH: 2.73 u[IU]/mL (ref 0.35–5.50)

## 2023-04-04 ENCOUNTER — Telehealth: Payer: Self-pay | Admitting: Internal Medicine

## 2023-04-04 NOTE — Telephone Encounter (Signed)
Needs appt please.  

## 2023-04-04 NOTE — Telephone Encounter (Signed)
Tried to call pt to make appt. Pt did not answer and voicemail is full.

## 2023-04-10 ENCOUNTER — Encounter: Payer: Self-pay | Admitting: Internal Medicine

## 2023-04-10 ENCOUNTER — Ambulatory Visit: Payer: Medicare PPO | Admitting: Internal Medicine

## 2023-04-10 VITALS — BP 126/62 | HR 64 | Temp 97.4°F | Resp 16 | Ht 63.0 in | Wt 145.5 lb

## 2023-04-10 DIAGNOSIS — R42 Dizziness and giddiness: Secondary | ICD-10-CM

## 2023-04-10 MED ORDER — CLORAZEPATE DIPOTASSIUM 7.5 MG PO TABS: 7.5000 mg | ORAL_TABLET | Freq: Every evening | ORAL | 1 refills | Status: DC | PRN

## 2023-04-10 NOTE — Progress Notes (Unsigned)
Subjective:    Patient ID: Cynthia Thornton, female    DOB: Nov 17, 1945, 77 y.o.   MRN: [id number]  DOS:  04/10/2023 Type of visit - description: Acute  Chief complaint is vertigo.  This is going on for 40 years, on and off. Current episode started 10 days ago, severe, "room spinning" when she turns her head to the right more so than to the left.  + Associated nausea Slightly better since she is started doing some self exercises. Denies any head injury, major headache. No diplopia or slurred speech.   Review of Systems See above   Past Medical History:  Diagnosis Date   Arthritis    Breast cancer, left [facility]) oncologist-- dr Pamelia Hoit   dx 09-26-2017--- IDC, Stage IA, Grade 2, ER/PR +;  HER2 negative;  s/p  breast lumpectomy w/ node dissection 11-02-2017;  completed radiation 01-09-2018 (left breast genetic screening panel 2017 with variant of unknown significance AXINA)   Bronchiectasis (HCC)    pulmologist-- dr Sandria Manly-- w/ chronic lingular scarring  (last exceratbation bronchiectasis 07/ 2020)   Dyslipidemia    followed by cardiology/ vascular-- dr g. adams ([facility] in [place])  family history strokes   Eczema    Family history of breast cancer    Family history of colon cancer    GERD (gastroesophageal reflux disease)    Hiatal hernia    History of esophageal stricture    s/p dilatation 2008   History of external beam radiation therapy    left breast 12-19-2017  to 01-09-2018   History of hyperthyroidism    endocrinologist-- (lov 04-11-2019 epic) dr Everardo All, dx 2009 due to multinodular goiter ,  had taken medication for few months 2010 stopped due to norma TFT;     Insomnia    Multinodular goiter last thyroid ultrasound in epic 12-18-2018   endocrinologist-- dr Everardo All,  dx 2009 w/ hyperthroidism with medication until normal TFT in 2010;     Osteopenia 02/2018   T score -2.4 distal third of radius.  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   Wears contact lenses     Past Surgical History:  Procedure Laterality Date   BREAST LUMPECTOMY WITH RADIOACTIVE SEED AND SENTINEL LYMPH NODE BIOPSY Left 11/02/2017   Procedure: LEFT BREAST LUMPECTOMY WITH RADIOACTIVE SEED AND LEFT AXILLARY DEEP SENTINEL LYMPH NODE BIOPSY, INJECT BLUE DYE LEFT BREAST;  Surgeon: Claud Kelp, MD;  Location: [facility];  Service: General;  Laterality: Left;   BREAST SURGERY  2000   Breast cyst removed, left   LAPAROSCOPIC BILATERAL SALPINGO OOPHERECTOMY Bilateral 10/13/2019   Procedure: LAPAROSCOPIC BILATERAL SALPINGO OOPHORECTOMY LYSIS OF ADHESIONS PERITONEAL WASHINGS ;  Surgeon: Genia Del, MD;  Location: [facility];  Service: Gynecology;  Laterality: Bilateral;  request 7:30am OR time in [place] [facility]yn block requests one hour   TONSILLECTOMY  age 84   TUBAL LIGATION Bilateral yrs ago   VAGINAL HYSTERECTOMY  1991   Right ovarian cystectomy   VIDEO BRONCHOSCOPY Bilateral 03/16/2015   Procedure: VIDEO BRONCHOSCOPY WITHOUT FLUORO;  Surgeon: Kalman Shan, MD;  Location: [facility]  ENDOSCOPY;  Service: Endoscopy;  Laterality: Bilateral;    Current Outpatient Medications  Medication Instructions   B Complex-C (B-COMPLEX WITH VITAMIN C) tablet 1 tablet, Oral, Daily   cetirizine (ZYRTEC) 10 mg, Oral, Daily   clorazepate (TRANXENE) 7.5 mg, Oral, At bedtime PRN   CRANBERRY PO 2 tablets, Oral, 2 times daily   dicyclomine (BENTYL) 20 mg, Oral, 3 times daily PRN   estradiol (ESTRACE) 0.1 MG/GM vaginal cream Place 0.5g nightly for two weeks then twice a week after   famotidine (PEPCID) 20 mg, Oral, Daily    methenamine (HIPREX) 1 g, Oral, 2 times daily with meals, Take with 500mg  of vitamin c w/ each tablet    methocarbamol (ROBAXIN) 500 mg, Every 8 hours PRN   nitrofurantoin (macrocrystal-monohydrate) (MACROBID) 100 mg, Oral, Daily   omeprazole (PRILOSEC) 40 mg, Oral, Daily   ondansetron (ZOFRAN-ODT) 4 mg, Oral, Every 8 hours PRN   Probiotic Product (PROBIOTIC PO) Oral   valACYclovir (VALTREX) 500 mg, Oral, Daily, 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   Vitamin D 2,000 Units, Oral, Daily       Objective:   Physical Exam BP 126/62   Pulse 64   Temp (!) 97.4 F (36.3 C) (Oral)   Resp 16   Ht 5\' 3"  (1.6 m)   Wt 145 lb 8 oz (66 kg)   SpO2 97%   BMI 25.77 kg/m  General:   Well developed, NAD, BMI noted. HEENT:  Normocephalic . Face symmetric, atraumatic Lower extremities: no pretibial edema bilaterally  Skin: Not pale. Not jaundice Neurologic:  alert & oriented X3.  Speech normal, gait appropriate for age and unassisted EOMI, no nystagmus.  Face symmetric.  Motor symmetric. Psych--  Cognition and judgment appear intact.  Cooperative with normal attention span and concentration.  Behavior appropriate. No anxious or depressed appearing.      Assessment     ASSESSMENT Bronchiectasis Dr Marchelle Gearing Anxiety - Insomnia : tranxene   At hs, rx by gyn, previously, I'll Rx if needed  Endocrinology Dr. Everardo All q 3 years, Last visit 03/2015 --H/o thyrotoxicosis on remission --Goiter   Osteopenia --T score -1.5  (2015), dexa 02-2016 , dexa 03/19/2018 (at gyn) DJD -- [facility]: GERD, HH and esophageal stricture EGD 2007, colon polyps Breast cancer, DX 08-2017, s/p lumpectomy,  XRT, B salpingo-oophorectomy Recurrent UTIs/OAB., onset ~ 2018.  Saw urogynecology [phone]   PLAN Vertigo: Chronic, symptoms are similar to previous episodes in the last 4 years.  Orthostatic vital signs negative. We agreed on PT referral for vestibular rehabilitation. Also, in the past she tried Valium prn w/ no  help, request RF on Tranxene because it helps her sleep even if she has some vertigo. PDMP okay Tranxene prescription sent. She is to let me know if symptoms are different or severe.

## 2023-04-10 NOTE — Patient Instructions (Addendum)
Will refer you to physical therapy  Call if anything changes or get worse  Please bring [place] a copy of your Healthcare Power of Attorney for your chart.   Vaccines I recommend:  Covid booster RSV vaccine

## 2023-04-11 NOTE — Assessment & Plan Note (Signed)
Vertigo: Chronic, symptoms are similar to previous episodes in the last 4 years.  Orthostatic vital signs negative. We agreed on PT referral for vestibular rehabilitation. Also, in the past she tried Valium prn w/ no help, request RF on Tranxene because it helps her sleep even if she has some vertigo. PDMP okay Tranxene prescription sent. She is to let me know if symptoms are different or severe.

## 2023-04-18 DIAGNOSIS — M2569 Stiffness of other specified joint, not elsewhere classified: Secondary | ICD-10-CM | POA: Diagnosis not present

## 2023-04-19 DIAGNOSIS — M5459 Other low back pain: Secondary | ICD-10-CM | POA: Diagnosis not present

## 2023-04-19 DIAGNOSIS — M4125 Other idiopathic scoliosis, thoracolumbar region: Secondary | ICD-10-CM | POA: Diagnosis not present

## 2023-04-19 DIAGNOSIS — M4312 Spondylolisthesis, cervical region: Secondary | ICD-10-CM | POA: Diagnosis not present

## 2023-04-20 ENCOUNTER — Telehealth: Payer: Self-pay

## 2023-04-20 NOTE — Telephone Encounter (Signed)
Plan of care signed and faxed back to [facility] at [phone]. Form sent for scanning.  

## 2023-04-24 DIAGNOSIS — R42 Dizziness and giddiness: Secondary | ICD-10-CM | POA: Diagnosis not present

## 2023-04-24 DIAGNOSIS — M2569 Stiffness of other specified joint, not elsewhere classified: Secondary | ICD-10-CM | POA: Diagnosis not present

## 2023-04-29 DIAGNOSIS — R42 Dizziness and giddiness: Secondary | ICD-10-CM | POA: Diagnosis not present

## 2023-04-29 DIAGNOSIS — M2569 Stiffness of other specified joint, not elsewhere classified: Secondary | ICD-10-CM | POA: Diagnosis not present

## 2023-05-01 DIAGNOSIS — M720 Palmar fascial fibromatosis [Dupuytren]: Secondary | ICD-10-CM | POA: Diagnosis not present

## 2023-05-01 DIAGNOSIS — M1811 Unilateral primary osteoarthritis of first carpometacarpal joint, right hand: Secondary | ICD-10-CM | POA: Diagnosis not present

## 2023-05-11 ENCOUNTER — Ambulatory Visit: Payer: Medicare PPO | Admitting: Obstetrics & Gynecology

## 2023-05-24 DIAGNOSIS — M4125 Other idiopathic scoliosis, thoracolumbar region: Secondary | ICD-10-CM | POA: Diagnosis not present

## 2023-05-24 DIAGNOSIS — M546 Pain in thoracic spine: Secondary | ICD-10-CM | POA: Diagnosis not present

## 2023-06-08 DIAGNOSIS — M5134 Other intervertebral disc degeneration, thoracic region: Secondary | ICD-10-CM | POA: Diagnosis not present

## 2023-06-08 DIAGNOSIS — M5124 Other intervertebral disc displacement, thoracic region: Secondary | ICD-10-CM | POA: Diagnosis not present

## 2023-06-08 DIAGNOSIS — M5126 Other intervertebral disc displacement, lumbar region: Secondary | ICD-10-CM | POA: Diagnosis not present

## 2023-06-08 DIAGNOSIS — M419 Scoliosis, unspecified: Secondary | ICD-10-CM | POA: Diagnosis not present

## 2023-06-08 DIAGNOSIS — M5136 Other intervertebral disc degeneration, lumbar region: Secondary | ICD-10-CM | POA: Diagnosis not present

## 2023-06-08 DIAGNOSIS — M47814 Spondylosis without myelopathy or radiculopathy, thoracic region: Secondary | ICD-10-CM | POA: Diagnosis not present

## 2023-06-08 DIAGNOSIS — M48061 Spinal stenosis, lumbar region without neurogenic claudication: Secondary | ICD-10-CM | POA: Diagnosis not present

## 2023-06-13 DIAGNOSIS — M25562 Pain in left knee: Secondary | ICD-10-CM | POA: Diagnosis not present

## 2023-06-13 DIAGNOSIS — M25561 Pain in right knee: Secondary | ICD-10-CM | POA: Diagnosis not present

## 2023-06-20 ENCOUNTER — Other Ambulatory Visit: Payer: Self-pay

## 2023-06-20 DIAGNOSIS — Z170 Estrogen receptor positive status [ER+]: Secondary | ICD-10-CM

## 2023-06-20 DIAGNOSIS — D4819 Other specified neoplasm of uncertain behavior of connective and other soft tissue: Secondary | ICD-10-CM

## 2023-06-20 NOTE — Progress Notes (Signed)
S/w pt per report of MRI thoracic and lumbar spine performed at [facility].   Per MD, order placed for NM whole body bone scan to r/o metastasis d/t findings on MRI.

## 2023-07-03 ENCOUNTER — Telehealth: Payer: Self-pay | Admitting: Internal Medicine

## 2023-07-03 NOTE — Telephone Encounter (Signed)
Copied from CRM [id number]. Topic: Medicare AWV >> Jul 03, 2023 11:34 AM Payton Doughty wrote: Reason for CRM: LM 07/03/2023 to schedule AWV   Verlee Rossetti; Care Guide Ambulatory Clinical Support [facility] Direct Dial: [phone]

## 2023-07-06 ENCOUNTER — Ambulatory Visit (HOSPITAL_COMMUNITY)
Admission: RE | Admit: 2023-07-06 | Discharge: 2023-07-06 | Disposition: A | Discharge status: Home / Self Care | Payer: Medicare PPO | Source: Ambulatory Visit | Attending: Hematology and Oncology | Admitting: Hematology and Oncology

## 2023-07-06 ENCOUNTER — Encounter: Payer: Self-pay | Admitting: Orthopedic Surgery

## 2023-07-06 ENCOUNTER — Encounter (HOSPITAL_COMMUNITY)
Admission: RE | Admit: 2023-07-06 | Discharge: 2023-07-06 | Disposition: A | Discharge status: Home / Self Care | Payer: Medicare PPO | Source: Ambulatory Visit | Attending: Hematology and Oncology | Admitting: Hematology and Oncology

## 2023-07-06 DIAGNOSIS — C50312 Malignant neoplasm of lower-inner quadrant of left female breast: Secondary | ICD-10-CM | POA: Insufficient documentation

## 2023-07-06 DIAGNOSIS — M419 Scoliosis, unspecified: Secondary | ICD-10-CM | POA: Diagnosis not present

## 2023-07-06 DIAGNOSIS — C799 Secondary malignant neoplasm of unspecified site: Secondary | ICD-10-CM | POA: Diagnosis not present

## 2023-07-06 DIAGNOSIS — Z170 Estrogen receptor positive status [ER+]: Secondary | ICD-10-CM | POA: Insufficient documentation

## 2023-07-06 DIAGNOSIS — C50912 Malignant neoplasm of unspecified site of left female breast: Secondary | ICD-10-CM | POA: Diagnosis not present

## 2023-07-06 MED ORDER — TECHNETIUM TC 99M MEDRONATE IV KIT: 20.0000 | PACK | Freq: Once | INTRAVENOUS | Status: DC | PRN

## 2023-07-10 NOTE — Progress Notes (Signed)
Patient Care Team: Wanda Plump, MD as PCP - Lanny Cramp, MD as Consulting Physician (Orthopedic Surgery) Serena Croissant, MD as Consulting Physician (Hematology and Oncology) Claud Kelp, MD as Consulting Physician (General Surgery) Kalman Shan, MD as Consulting Physician (Pulmonary Disease) Dorothy Puffer, MD as Consulting Physician (Radiation Oncology) Axel Filler, Larna Daughters, NP as Nurse Practitioner (Hematology and Oncology) Crist Fat, MD as Attending Physician (Urology) Christia Reading, MD as Consulting Physician (Otolaryngology) Sherrilyn Rist. (Inactive) (Cardiology) Genia Del, MD as Consulting Physician (Obstetrics and Gynecology)  DIAGNOSIS:  Encounter Diagnosis  Name Primary?   Malignant neoplasm of lower-inner quadrant of left breast in female, estrogen receptor positive (HCC) Yes    SUMMARY OF ONCOLOGIC HISTORY: Oncology History  Malignant neoplasm of lower-inner quadrant of left breast in female, estrogen receptor positive (HCC)  09/26/2017 Initial Diagnosis   Screening detected left breast calcifications 1.1 cm span axilla negative, biopsy revealed grade 2 invasive ductal carcinoma with DCIS ER 100% PR 2%, HER-2 negative ratio 0.78, Ki-67 20%, T1CN0 stage Ia clinical stage AJCC 8   11/02/2017 Surgery   Left Lumpectomy: IDC grade 2, 1.2 cm, 0/3 LN Neg, , ER 100% PR 2%, HER-2 negative ratio 0.78, Ki-67 20% T1c N0 Stage 1A   12/19/2017 - 01/09/2018 Radiation Therapy   Adjuvant radiation therapy   01/2018 - 11/2021 Anti-estrogen oral therapy   Letrozole daily stopped because of depression and hot flashes and myalgias, Discontinued Oct 2019 and resumed Oct 2021 taking it 1/2 tab daily     CHIEF COMPLIANT:   Discussed the use of AI scribe software for clinical note transcription with the patient, who gave verbal consent to proceed.  History of Present Illness   The patient, with a history of cancer, presents for the results of a recent  bone scan. She has been under significant stress, keeping her concerns to herself to avoid worrying her family. She has been experiencing chronic pain for the past year and a half, which she attributed to possible cancer recurrence. She has multilevel arthritis and is considering surgery for scoliosis and kyphosis, which have been causing constant pain. The patient is scheduled to see a specialist in two weeks.         ALLERGIES:  is allergic to doxycycline, other, oxycodone, sulfa antibiotics, trimethoprim sulfate [trimethoprim], codeine, and propranolol hcl.  MEDICATIONS:  Current Outpatient Medications  Medication Sig Dispense Refill   B Complex-C (B-COMPLEX WITH VITAMIN C) tablet Take 1 tablet by mouth daily.     cetirizine (ZYRTEC) 10 MG tablet Take 10 mg by mouth daily.     Cholecalciferol (VITAMIN D) 2000 units CAPS Take 2,000 Units by mouth daily.     clorazepate (TRANXENE) 7.5 MG tablet Take 1 tablet (7.5 mg total) by mouth at bedtime as needed for anxiety. 30 tablet 1   CRANBERRY PO Take 2 tablets by mouth in the morning and at bedtime.     dicyclomine (BENTYL) 20 MG tablet TAKE 1 TABLET (20 MG TOTAL) BY MOUTH 3 (THREE) TIMES DAILY AS NEEDED FOR SPASMS. 270 tablet 1   estradiol (ESTRACE) 0.1 MG/GM vaginal cream Place 0.5g nightly for two weeks then twice a week after (Patient not taking: Reported on 03/16/2023) 30 g 11   famotidine (PEPCID) 20 MG tablet TAKE 1 TABLET BY MOUTH EVERY DAY 90 tablet 3   methenamine (HIPREX) 1 g tablet Take 1 g by mouth 2 (two) times daily with a meal. Take with 500mg  of vitamin c w/ each tablet  methocarbamol (ROBAXIN) 500 MG tablet Take 500 mg by mouth every 8 (eight) hours as needed. (Patient not taking: Reported on 04/10/2023)     nitrofurantoin, macrocrystal-monohydrate, (MACROBID) 100 MG capsule Take 1 capsule (100 mg total) by mouth daily. 30 capsule 5   omeprazole (PRILOSEC) 40 MG capsule TAKE 1 CAPSULE (40 MG TOTAL) BY MOUTH DAILY. 90 capsule 3    ondansetron (ZOFRAN-ODT) 4 MG disintegrating tablet TAKE 1 TABLET BY MOUTH EVERY 8 HOURS AS NEEDED FOR NAUSEA OR VOMITING (Patient not taking: Reported on 04/10/2023) 30 tablet 0   Probiotic Product (PROBIOTIC PO) Take by mouth.     valACYclovir (VALTREX) 500 MG tablet Take 1 tablet (500 mg total) by mouth daily. 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 90 tablet 4   No current facility-administered medications for this visit.    PHYSICAL EXAMINATION: ECOG PERFORMANCE STATUS: 1 - Symptomatic but completely ambulatory  Vitals:   07/13/23 0936  BP: 129/73  Pulse: 80  Resp: 18  Temp: 97.7 F (36.5 C)  SpO2: 100%   Filed Weights   07/13/23 0936  Weight: 141 lb 9.6 oz (64.2 kg)    Physical Exam          (exam performed in the presence of a chaperone)  LABORATORY DATA:  I have reviewed the data as listed    Latest Ref Rng & Units 01/12/2023   10:24 AM 11/13/2022   10:05 AM 10/09/2022   12:00 AM  CMP  Glucose 70 - 99 mg/dL 88  93    BUN 6 - 23 mg/dL 32  27  16      Creatinine 0.40 - 1.20 mg/dL 6.57  8.46  1.0      Sodium 135 - 145 mEq/L 134  133  136      Potassium 3.5 - 5.1 mEq/L 4.2  4.9  4.5      Chloride 96 - 112 mEq/L 104  102  101      CO2 19 - 32 mEq/L 22  22  21       Calcium 8.4 - 10.5 mg/dL 9.5  9.4  9.5      Total Protein 6.0 - 8.3 g/dL 7.0  6.9    Total Bilirubin 0.2 - 1.2 mg/dL 0.3  0.4    Alkaline Phos 39 - 117 U/L 86  91    AST 0 - 37 U/L 15  17    ALT 0 - 35 U/L 12  14       This result is from an external source.    Lab Results  Component Value Date   WBC 7.3 01/12/2023   HGB 12.7 01/12/2023   HCT 36.6 01/12/2023   MCV 95.6 01/12/2023   PLT 339.0 01/12/2023   NEUTROABS 4.5 01/12/2023    ASSESSMENT & PLAN:  Malignant neoplasm of lower-inner quadrant of left breast in female, estrogen receptor positive (HCC) 10/31/17: Left Lumpectomy: IDC grade 2, 1.2 cm, 0/3 LN Neg, , ER 100% PR 2%, HER-2 negative ratio 0.78, Ki-67 20% T1c N0 Stage 1A   Treatment summary: 1.  Oncotype DX 16: 4% risk of recurrence with 5 years of tamoxifen: Low risk 2. Adjuvant radiation therapy 12/19/2017 to 01/11/2018 3. Adjuvant antiestrogen therapy: With letrozole 2.5 mg daily to started  01/28/2018 stopped within a few months because of hot flashes and depression, restarted 08/06/2020 1 tablet every other day stopped February 2023   She could not tolerate Effexor because of nausea and vomiting.   Breast cancer surveillance: 1.  Mammograms 09/26/2021: Benign 2.  Breast exam 07/13/2023: Benign 3.  MRI lumbar spine at [facility]: 06/08/2023: Multiple hemangiomas thoracic and lumbar spine, multilevel disc bulges probable cysts in the liver possibly hemangiomas 4.  Bone scan 07/06/2023: No evidence of metastatic disease.  Ultrasound thyroid 02/16/2022: Multinodular thyroid again identified.  Continued annual surveillance for 5 years   Bone density 04/20/2020: T score -3: Osteoporosis Bone density 06/13/2022: T-score -2.2: Osteopenia   Return to clinic on an as-needed basis. ------------------------------------- Assessment and Plan    Cancer Surveillance Patient was under significant stress due to concern for cancer recurrence. Bone scan results showed no concern for cancer, only multilevel arthritis. -No further action required at this time.  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 Now considering surgery with Dr. Theodora Blow at the [facility] in [place]. -Provide patient with a copy of the bone scan results for her upcoming appointment with Dr. Cherene Altes. -Encourage patient to proceed with any necessary interventions for pain management.  Follow-up This may be the final visit, as patient's cancer surveillance has shown no signs of recurrence. -No further appointments scheduled at this time.          No orders of the defined types were placed in this encounter.  The patient has a good  understanding of the overall plan. she agrees with it. she will call with any problems that may develop before the next visit here. Total time spent: 30 mins including face to face time and time spent for planning, charting and co-ordination of care   Tamsen Meek, MD 07/13/23

## 2023-07-11 ENCOUNTER — Telehealth: Payer: Self-pay | Admitting: Internal Medicine

## 2023-07-11 DIAGNOSIS — R42 Dizziness and giddiness: Secondary | ICD-10-CM

## 2023-07-11 NOTE — Telephone Encounter (Signed)
Pt said she is having another vertigo episode and wants to know if she can get another referral to [facility]. Please advise if she can have this done without scheduling another appt since she just saw him in July.

## 2023-07-11 NOTE — Addendum Note (Signed)
Addended byConrad [place] D on: 07/11/2023 12:50 PM   Modules accepted: Orders

## 2023-07-11 NOTE — Telephone Encounter (Signed)
Referral placed.

## 2023-07-12 DIAGNOSIS — M4125 Other idiopathic scoliosis, thoracolumbar region: Secondary | ICD-10-CM | POA: Diagnosis not present

## 2023-07-12 DIAGNOSIS — M5459 Other low back pain: Secondary | ICD-10-CM | POA: Diagnosis not present

## 2023-07-13 ENCOUNTER — Inpatient Hospital Stay: Payer: Medicare PPO | Attending: Hematology and Oncology | Admitting: Hematology and Oncology

## 2023-07-13 ENCOUNTER — Telehealth: Payer: Self-pay

## 2023-07-13 VITALS — BP 129/73 | HR 80 | Temp 97.7°F | Resp 18 | Ht 63.0 in | Wt 141.6 lb

## 2023-07-13 DIAGNOSIS — E042 Nontoxic multinodular goiter: Secondary | ICD-10-CM | POA: Diagnosis not present

## 2023-07-13 DIAGNOSIS — Z79624 Long term (current) use of inhibitors of nucleotide synthesis: Secondary | ICD-10-CM | POA: Diagnosis not present

## 2023-07-13 DIAGNOSIS — C50312 Malignant neoplasm of lower-inner quadrant of left female breast: Secondary | ICD-10-CM | POA: Diagnosis not present

## 2023-07-13 DIAGNOSIS — G8929 Other chronic pain: Secondary | ICD-10-CM | POA: Insufficient documentation

## 2023-07-13 DIAGNOSIS — Z881 Allergy status to other antibiotic agents status: Secondary | ICD-10-CM | POA: Diagnosis not present

## 2023-07-13 DIAGNOSIS — Z170 Estrogen receptor positive status [ER+]: Secondary | ICD-10-CM | POA: Insufficient documentation

## 2023-07-13 DIAGNOSIS — Z79811 Long term (current) use of aromatase inhibitors: Secondary | ICD-10-CM | POA: Insufficient documentation

## 2023-07-13 DIAGNOSIS — M419 Scoliosis, unspecified: Secondary | ICD-10-CM | POA: Diagnosis not present

## 2023-07-13 DIAGNOSIS — Z885 Allergy status to narcotic agent status: Secondary | ICD-10-CM | POA: Diagnosis not present

## 2023-07-13 DIAGNOSIS — Z882 Allergy status to sulfonamides status: Secondary | ICD-10-CM | POA: Insufficient documentation

## 2023-07-13 DIAGNOSIS — H8111 Benign paroxysmal vertigo, right ear: Secondary | ICD-10-CM | POA: Diagnosis not present

## 2023-07-13 DIAGNOSIS — Z79899 Other long term (current) drug therapy: Secondary | ICD-10-CM | POA: Diagnosis not present

## 2023-07-13 DIAGNOSIS — R112 Nausea with vomiting, unspecified: Secondary | ICD-10-CM | POA: Diagnosis not present

## 2023-07-13 NOTE — Assessment & Plan Note (Addendum)
10/31/17: Left Lumpectomy: IDC grade 2, 1.2 cm, 0/3 LN Neg, , ER 100% PR 2%, HER-2 negative ratio 0.78, Ki-67 20% T1c N0 Stage 1A   Treatment summary: 1. Oncotype DX 16: 4% risk of recurrence with 5 years of tamoxifen: Low risk 2. Adjuvant radiation therapy 12/19/2017 to 01/11/2018 3. Adjuvant antiestrogen therapy: With letrozole 2.5 mg daily to started  01/28/2018 stopped within a few months because of hot flashes and depression, restarted 08/06/2020 1 tablet every other day stopped February 2023   She could not tolerate Effexor because of nausea and vomiting.   Breast cancer surveillance: 1.  Mammograms 09/26/2021: Benign 2.  Breast exam 07/13/2023: Benign 3.  MRI lumbar spine at [facility]: 06/08/2023: Multiple hemangiomas thoracic and lumbar spine, multilevel disc bulges probable cysts in the liver possibly hemangiomas 4.  Bone scan 07/06/2023: No evidence of metastatic disease.  Ultrasound thyroid 02/16/2022: Multinodular thyroid again identified.  Continued annual surveillance for 5 years   Bone density 04/20/2020: T score -3: Osteoporosis Bone density 06/13/2022: T-score -2.2: Osteopenia   Return to clinic on an as-needed basis.

## 2023-07-13 NOTE — Telephone Encounter (Signed)
Plan of care signed and faxed back to [facility] at [phone]. Form sent for scanning.

## 2023-07-16 DIAGNOSIS — H8111 Benign paroxysmal vertigo, right ear: Secondary | ICD-10-CM | POA: Diagnosis not present

## 2023-07-27 DIAGNOSIS — H8111 Benign paroxysmal vertigo, right ear: Secondary | ICD-10-CM | POA: Diagnosis not present

## 2023-08-10 ENCOUNTER — Other Ambulatory Visit (HOSPITAL_BASED_OUTPATIENT_CLINIC_OR_DEPARTMENT_OTHER): Payer: Self-pay

## 2023-08-10 DIAGNOSIS — J479 Bronchiectasis, uncomplicated: Secondary | ICD-10-CM

## 2023-08-16 DIAGNOSIS — M4125 Other idiopathic scoliosis, thoracolumbar region: Secondary | ICD-10-CM | POA: Diagnosis not present

## 2023-08-17 ENCOUNTER — Encounter: Payer: Medicare PPO | Admitting: Internal Medicine

## 2023-08-17 ENCOUNTER — Ambulatory Visit: Payer: Medicare PPO | Admitting: Internal Medicine

## 2023-08-17 ENCOUNTER — Ambulatory Visit (HOSPITAL_BASED_OUTPATIENT_CLINIC_OR_DEPARTMENT_OTHER): Payer: Medicare PPO | Admitting: Internal Medicine

## 2023-08-17 ENCOUNTER — Encounter: Payer: Self-pay | Admitting: Internal Medicine

## 2023-08-17 VITALS — BP 124/73 | HR 66 | Temp 98.2°F | Ht 62.0 in | Wt 143.0 lb

## 2023-08-17 DIAGNOSIS — M419 Scoliosis, unspecified: Secondary | ICD-10-CM | POA: Diagnosis not present

## 2023-08-17 DIAGNOSIS — Z01811 Encounter for preprocedural respiratory examination: Secondary | ICD-10-CM

## 2023-08-17 DIAGNOSIS — J479 Bronchiectasis, uncomplicated: Secondary | ICD-10-CM | POA: Diagnosis not present

## 2023-08-17 LAB — PULMONARY FUNCTION TEST
DL/VA % pred: 102 %
DL/VA: 4.27 ml/min/mmHg/L
DLCO cor % pred: 102 %
DLCO cor: 18.37 ml/min/mmHg
DLCO unc % pred: 102 %
DLCO unc: 18.37 ml/min/mmHg
FEF 25-75 Pre: 1.13 L/s
FEF2575-%Pred-Pre: 72 %
FEV1-%Pred-Pre: 95 %
FEV1-Pre: 1.84 L
FEV1FVC-%Pred-Pre: 93 %
FEV6-%Pred-Pre: 106 %
FEV6-Pre: 2.6 L
FEV6FVC-%Pred-Pre: 104 %
FVC-%Pred-Pre: 102 %
FVC-Pre: 2.63 L
Pre FEV1/FVC ratio: 70 %
Pre FEV6/FVC Ratio: 99 %

## 2023-08-17 NOTE — Progress Notes (Signed)
Spirometry and DLCO Performed Today.  

## 2023-08-17 NOTE — Progress Notes (Signed)
MD as Consulting Physician (Otolaryngology)  This Provider for this visit: Treatment Team:  Attending Provider: Kalman Shan, MD    08/22/2021 -   Chief Complaint  Patient presents with   Follow-up    Pt states she has been doing well since last visit. States that she has not been coughing  up any blood.     HPI Cynthia Thornton 77 y.o. -follow-up mild bronchiectasis.  Last CT scan of the chest 2017.  Last pulmonary function test 2014.  Is a 1 year follow-up.  She continues to do well.  No respiratory symptoms other than a couple of episodes of mild hemoptysis in the last 1 year.  Up-to-date with flu shot.  In July 2022 she had COVID booster but now that is the by [place] COVID booster and she wants to know if she can or should have it.  She has never had COVID.  Overall doing well without any complaints.     OV 08/28/2022  Subjective:  Patient ID: Jefm Petty, female , DOB: [birth year] , age 57 y.o. , MRN: [id number] , ADDRESS: [address] [place] [address] PCP Wanda Plump, MD Patient Care Team: Wanda Plump, MD as PCP - Lanny Cramp, MD as Consulting Physician (Orthopedic Surgery) Serena Croissant, MD as Consulting Physician (Hematology and Oncology) Claud Kelp, MD as Consulting Physician (General Surgery) Kalman Shan, MD as Consulting Physician (Pulmonary Disease) Dorothy Puffer, MD as Consulting Physician (Radiation Oncology) Axel Filler, Larna Daughters, NP as Nurse Practitioner (Hematology and Oncology) Crist Fat, MD as Attending Physician (Urology) Christia Reading, MD as Consulting Physician (Otolaryngology) Sherrilyn Rist. (Inactive) (Cardiology) Genia Del, MD as Consulting Physician (Obstetrics and Gynecology)  This Provider for this visit: Treatment Team:  Attending Provider: Kalman Shan, MD    08/28/2022 -   Chief Complaint  Patient presents with   Follow-up    Pt states she has been doing okay since last visit. States she needs a new flutter valve.     HPI Cynthia Thornton 77 y.o. -returns for follow-up.  Its been a year since I last saw her.  No new health issues.  No ER visits no hospitalizations no new symptoms no changes in medications.  She continues to be stable no further hemoptysis.  Last CT scan of the  chest was 1 year ago last pulmonary function test was in 2014.  She has questions about respiratory vaccines.  She has had a flu shot.  She wanted know about the RSV vaccine.  She is hesitant to do the COVID mRNA vaccine.  This is because of a lot of anecdotal experience about people she know who have had significant cardiotoxicity from COVID mRNA vaccine.  We discussed the fact that RSV vaccine is new but similar technology to flu shot.  Similar side effect profile.  Did discuss RSV virology and significance.  Overall benefit outweighs the risk.  She is going to have the RSV vaccine after he took a shared decision making on it.  She will have  at a commercial pharmacy.    CT Chest data -  Narrative & Impression  CLINICAL DATA:  Follow-up bronchiectasis   EXAM: CT CHEST WITHOUT CONTRAST   TECHNIQUE: Multidetector CT imaging of the chest was performed following the standard protocol without IV contrast.   COMPARISON:  05/25/2016   FINDINGS: Cardiovascular: No significant vascular findings. Normal heart size. Small pericardial effusion.   Mediastinum/Nodes: No enlarged mediastinal, hilar, or axillary lymph nodes. Thyroid  gland, trachea, and esophagus demonstrate no significant findings.   Lungs/Pleura: Mild, subpleural radiation fibrosis of the anterior left upper lobe (series 3, image 64). Unchanged mild bronchiectasis, fibrotic scarring and volume loss of the medial segment right middle lobe and lingula (series 3, image 102). There are new, scattered centrilobular and tree-in-bud nodules within the medial segment right middle lobe (series 3, image 102). No pleural effusion or pneumothorax.   Upper Abdomen: No acute abnormality. Multiple lobulated, fluid attenuation cysts or hemangiomata of the liver, unchanged.   Musculoskeletal: No chest wall mass or suspicious bone lesions identified. Surgical clips in the left breast. Dextroscoliosis of the thoracic spine. Benign vertebral  body hemangioma of T9 (series 6, image 70).   IMPRESSION: 1. Unchanged mild bronchiectasis, fibrotic scarring and volume loss of the medial segment right middle lobe and lingula. 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 2. There are new, scattered centrilobular and tree-in-bud nodules within the medial segment right middle lobe, again consistent with atypical mycobacterial infection. 3. Mild, subpleural radiation fibrosis of the anterior left upper lobe.     Electronically Signed   By: Jearld Lesch M.D.   On: 08/30/2021 16:08    No results found.   OV 08/17/2023  Subjective:  Patient ID: Jefm Petty, female , DOB: Mar 14, 1946 , age 37 y.o. , MRN: [id number] , ADDRESS: [address] [place] [address] PCP Wanda Plump, MD Patient Care Team: Wanda Plump, MD as PCP - Lanny Cramp, MD as Consulting Physician (Orthopedic Surgery) Serena Croissant, MD as Consulting Physician (Hematology and Oncology) Claud Kelp, MD as Consulting Physician (General Surgery) Kalman Shan, MD as Consulting Physician (Pulmonary Disease) Dorothy Puffer, MD as Consulting Physician (Radiation Oncology) Axel Filler, Larna Daughters, NP as Nurse Practitioner (Hematology and Oncology) Crist Fat, MD as Attending Physician (Urology) Christia Reading, MD as Consulting Physician (Otolaryngology) Sherrilyn Rist. (Inactive) (Cardiology) Genia Del, MD as Consulting Physician (Obstetrics and Gynecology)  This Provider for this visit: Treatment Team:  Attending Provider: Kalman Shan, MD    08/17/2023 -   Chief Complaint  Patient presents with   Follow-up    PFT done today. Breathing is doing well.       HPI Cynthia Thornton 77 y.o. -  #Mild bronchiectasis follow-up.  She returns for routine follow-up.  We decided do a pulmonary function test.  Essentially normal.  Is age-related decline  compared to 10 years ago but the test is normal.  She remains asymptomatic.  No further hemoptysis.  She already had the flu shot  #Preop clearance: This is a new issue.  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  [facility]  [facility].  On December 03, 2023 they are proposing major spine surgery.  She wanted clearance.  I did approve the surgery particularly with her lung function being normal and her being minimally symptomatic but did caution her against any respiratory infection in the weeks leading up to the surgery.  We then took a shared decision making for her to return in mid January 2025 approximately 3 weeks before the surgery for a routine pulm evaluation preop.  She is agreeable to this plan.    PFT    Latest Ref Rng & Units 08/17/2023    8:16 AM 10/20/2013    8:37 AM  PFT Results  FVC-Pre L 2.63  P 3.34   FVC-Predicted Pre %  OV 10/04/2018  Subjective:  Patient ID: ANGILEE WOOLDRIDGE, female , DOB: 09/15/1946 , age 17 y.o. , MRN: [id number] , ADDRESS: [address] [place] [address]   10/04/2018 -   Chief Complaint  Patient presents with   Follow-up    f/u bronchiectasis, pt reports she is doing well , flutter valve has helped her get mucus up, some cough but has gotten better, mucus is thinner     HPI KALYN SENNA 77 y.o. -presents for follow-up of her mild bronchiectasis with mild sputum production.  Last visit September 2019 we introduce flutter valve after discussing several options because sputum volume had increased.  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  She occasionally uses Mucinex.  She tries to avoid medicines.  She is up-to-date with her pulmonary vaccines.  She would benefit from a new Shingrix shingles vaccine which she will talk to her primary care about.     ROS - per HPI   OV 08/12/2020  Subjective:  Patient ID: Jefm Petty, female , DOB: [birth year] , age 89 y.o. , MRN: [id number] , ADDRESS: [address] [place] [address] PCP Wanda Plump, MD Patient Care Team: Wanda Plump, MD as PCP - Lanny Cramp, MD as Consulting Physician (Orthopedic Surgery) Serena Croissant, MD as Consulting Physician (Hematology and Oncology) Claud Kelp, MD as Consulting Physician (General Surgery) Fontaine, Nadyne Coombes, MD (Inactive) as Consulting Physician (Gynecology) Kalman Shan, MD as Consulting Physician (Pulmonary Disease) Dorothy Puffer, MD as Consulting Physician (Radiation Oncology) Axel Filler Larna Daughters, NP as Nurse Practitioner (Hematology and Oncology) Crist Fat, MD as Attending Physician (Urology) Christia Reading, MD as Consulting Physician (Otolaryngology)  This Provider for this visit: Treatment Team:  Attending Provider: Kalman Shan, MD    08/12/2020 -   Chief Complaint  Patient presents with   Follow-up     Bronchiectasis, doing well     HPI JERNEI PADILLO 77 y.o. -follow-up for mild bronchiectasis.  Last seen in December 2019.  Then after the onset of the pandemic and never saw her.  She has been isolating.  She is fully vaccinated.  In July 2020 after exposure to work she had a bronchiectasis exacerbation.  She says since then she has been doing well.  She says that after seeing the nurse practitioner and starting Mucinex as needed and flutter valve she has been doing well.  There is no acute issues.     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 No evidence of acute cardiopulmonary disease. Electronically Signed   By: Charline Bills M.D.   On: 05/25/2016 17:16   ROS - per HPI    OV 08/22/2021  Subjective:  Patient ID: Jefm Petty, female , DOB: May 30, 1946 , age 65 y.o. , MRN: [id number] , ADDRESS: [address] [place] [address] PCP Wanda Plump, MD Patient Care Team: Wanda Plump, MD as PCP - Lanny Cramp, MD as Consulting Physician (Orthopedic Surgery) Serena Croissant, MD as Consulting Physician (Hematology and Oncology) Claud Kelp, MD as Consulting Physician (General Surgery) Fontaine, Nadyne Coombes, MD (Inactive) as Consulting Physician (Gynecology) Kalman Shan, MD as Consulting Physician (Pulmonary Disease) Dorothy Puffer, MD as Consulting Physician (Radiation Oncology) Axel Filler, Larna Daughters, NP as Nurse Practitioner (Hematology and Oncology) Crist Fat, MD as Attending Physician (Urology) Christia Reading,  gland, trachea, and esophagus demonstrate no significant findings.   Lungs/Pleura: Mild, subpleural radiation fibrosis of the anterior left upper lobe (series 3, image 64). Unchanged mild bronchiectasis, fibrotic scarring and volume loss of the medial segment right middle lobe and lingula (series 3, image 102). There are new, scattered centrilobular and tree-in-bud nodules within the medial segment right middle lobe (series 3, image 102). No pleural effusion or pneumothorax.   Upper Abdomen: No acute abnormality. Multiple lobulated, fluid attenuation cysts or hemangiomata of the liver, unchanged.   Musculoskeletal: No chest wall mass or suspicious bone lesions identified. Surgical clips in the left breast. Dextroscoliosis of the thoracic spine. Benign vertebral  body hemangioma of T9 (series 6, image 70).   IMPRESSION: 1. Unchanged mild bronchiectasis, fibrotic scarring and volume loss of the medial segment right middle lobe and lingula. 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 2. There are new, scattered centrilobular and tree-in-bud nodules within the medial segment right middle lobe, again consistent with atypical mycobacterial infection. 3. Mild, subpleural radiation fibrosis of the anterior left upper lobe.     Electronically Signed   By: Jearld Lesch M.D.   On: 08/30/2021 16:08    No results found.   OV 08/17/2023  Subjective:  Patient ID: Jefm Petty, female , DOB: Mar 14, 1946 , age 37 y.o. , MRN: [id number] , ADDRESS: [address] [place] [address] PCP Wanda Plump, MD Patient Care Team: Wanda Plump, MD as PCP - Lanny Cramp, MD as Consulting Physician (Orthopedic Surgery) Serena Croissant, MD as Consulting Physician (Hematology and Oncology) Claud Kelp, MD as Consulting Physician (General Surgery) Kalman Shan, MD as Consulting Physician (Pulmonary Disease) Dorothy Puffer, MD as Consulting Physician (Radiation Oncology) Axel Filler, Larna Daughters, NP as Nurse Practitioner (Hematology and Oncology) Crist Fat, MD as Attending Physician (Urology) Christia Reading, MD as Consulting Physician (Otolaryngology) Sherrilyn Rist. (Inactive) (Cardiology) Genia Del, MD as Consulting Physician (Obstetrics and Gynecology)  This Provider for this visit: Treatment Team:  Attending Provider: Kalman Shan, MD    08/17/2023 -   Chief Complaint  Patient presents with   Follow-up    PFT done today. Breathing is doing well.       HPI Cynthia Thornton 77 y.o. -  #Mild bronchiectasis follow-up.  She returns for routine follow-up.  We decided do a pulmonary function test.  Essentially normal.  Is age-related decline  compared to 10 years ago but the test is normal.  She remains asymptomatic.  No further hemoptysis.  She already had the flu shot  #Preop clearance: This is a new issue.  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  She has [facility]  [facility].  On December 03, 2023 they are proposing major spine surgery.  She wanted clearance.  I did approve the surgery particularly with her lung function being normal and her being minimally symptomatic but did caution her against any respiratory infection in the weeks leading up to the surgery.  We then took a shared decision making for her to return in mid January 2025 approximately 3 weeks before the surgery for a routine pulm evaluation preop.  She is agreeable to this plan.    PFT    Latest Ref Rng & Units 08/17/2023    8:16 AM 10/20/2013    8:37 AM  PFT Results  FVC-Pre L 2.63  P 3.34   FVC-Predicted Pre %  gland, trachea, and esophagus demonstrate no significant findings.   Lungs/Pleura: Mild, subpleural radiation fibrosis of the anterior left upper lobe (series 3, image 64). Unchanged mild bronchiectasis, fibrotic scarring and volume loss of the medial segment right middle lobe and lingula (series 3, image 102). There are new, scattered centrilobular and tree-in-bud nodules within the medial segment right middle lobe (series 3, image 102). No pleural effusion or pneumothorax.   Upper Abdomen: No acute abnormality. Multiple lobulated, fluid attenuation cysts or hemangiomata of the liver, unchanged.   Musculoskeletal: No chest wall mass or suspicious bone lesions identified. Surgical clips in the left breast. Dextroscoliosis of the thoracic spine. Benign vertebral  body hemangioma of T9 (series 6, image 70).   IMPRESSION: 1. Unchanged mild bronchiectasis, fibrotic scarring and volume loss of the medial segment right middle lobe and lingula. 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 2. There are new, scattered centrilobular and tree-in-bud nodules within the medial segment right middle lobe, again consistent with atypical mycobacterial infection. 3. Mild, subpleural radiation fibrosis of the anterior left upper lobe.     Electronically Signed   By: Jearld Lesch M.D.   On: 08/30/2021 16:08    No results found.   OV 08/17/2023  Subjective:  Patient ID: Jefm Petty, female , DOB: Mar 14, 1946 , age 37 y.o. , MRN: [id number] , ADDRESS: [address] [place] [address] PCP Wanda Plump, MD Patient Care Team: Wanda Plump, MD as PCP - Lanny Cramp, MD as Consulting Physician (Orthopedic Surgery) Serena Croissant, MD as Consulting Physician (Hematology and Oncology) Claud Kelp, MD as Consulting Physician (General Surgery) Kalman Shan, MD as Consulting Physician (Pulmonary Disease) Dorothy Puffer, MD as Consulting Physician (Radiation Oncology) Axel Filler, Larna Daughters, NP as Nurse Practitioner (Hematology and Oncology) Crist Fat, MD as Attending Physician (Urology) Christia Reading, MD as Consulting Physician (Otolaryngology) Sherrilyn Rist. (Inactive) (Cardiology) Genia Del, MD as Consulting Physician (Obstetrics and Gynecology)  This Provider for this visit: Treatment Team:  Attending Provider: Kalman Shan, MD    08/17/2023 -   Chief Complaint  Patient presents with   Follow-up    PFT done today. Breathing is doing well.       HPI Cynthia Thornton 77 y.o. -  #Mild bronchiectasis follow-up.  She returns for routine follow-up.  We decided do a pulmonary function test.  Essentially normal.  Is age-related decline  compared to 10 years ago but the test is normal.  She remains asymptomatic.  No further hemoptysis.  She already had the flu shot  #Preop clearance: This is a new issue.  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  She has physical [facility] through [facility]  [facility].  On December 03, 2023 they are proposing major spine surgery.  She wanted clearance.  I did approve the surgery particularly with her lung function being normal and her being minimally symptomatic but did caution her against any respiratory infection in the weeks leading up to the surgery.  We then took a shared decision making for her to return in mid January 2025 approximately 3 weeks before the surgery for a routine pulm evaluation preop.  She is agreeable to this plan.    PFT    Latest Ref Rng & Units 08/17/2023    8:16 AM 10/20/2013    8:37 AM  PFT Results  FVC-Pre L 2.63  P 3.34   FVC-Predicted Pre %  MD as Consulting Physician (Otolaryngology)  This Provider for this visit: Treatment Team:  Attending Provider: Kalman Shan, MD    08/22/2021 -   Chief Complaint  Patient presents with   Follow-up    Pt states she has been doing well since last visit. States that she has not been coughing  up any blood.     HPI Cynthia Thornton 77 y.o. -follow-up mild bronchiectasis.  Last CT scan of the chest 2017.  Last pulmonary function test 2014.  Is a 1 year follow-up.  She continues to do well.  No respiratory symptoms other than a couple of episodes of mild hemoptysis in the last 1 year.  Up-to-date with flu shot.  In July 2022 she had COVID booster but now that is the by [place] COVID booster and she wants to know if she can or should have it.  She has never had COVID.  Overall doing well without any complaints.     OV 08/28/2022  Subjective:  Patient ID: Jefm Petty, female , DOB: [birth year] , age 57 y.o. , MRN: [id number] , ADDRESS: [address] [place] [address] PCP Wanda Plump, MD Patient Care Team: Wanda Plump, MD as PCP - Lanny Cramp, MD as Consulting Physician (Orthopedic Surgery) Serena Croissant, MD as Consulting Physician (Hematology and Oncology) Claud Kelp, MD as Consulting Physician (General Surgery) Kalman Shan, MD as Consulting Physician (Pulmonary Disease) Dorothy Puffer, MD as Consulting Physician (Radiation Oncology) Axel Filler, Larna Daughters, NP as Nurse Practitioner (Hematology and Oncology) Crist Fat, MD as Attending Physician (Urology) Christia Reading, MD as Consulting Physician (Otolaryngology) Sherrilyn Rist. (Inactive) (Cardiology) Genia Del, MD as Consulting Physician (Obstetrics and Gynecology)  This Provider for this visit: Treatment Team:  Attending Provider: Kalman Shan, MD    08/28/2022 -   Chief Complaint  Patient presents with   Follow-up    Pt states she has been doing okay since last visit. States she needs a new flutter valve.     HPI Cynthia Thornton 77 y.o. -returns for follow-up.  Its been a year since I last saw her.  No new health issues.  No ER visits no hospitalizations no new symptoms no changes in medications.  She continues to be stable no further hemoptysis.  Last CT scan of the  chest was 1 year ago last pulmonary function test was in 2014.  She has questions about respiratory vaccines.  She has had a flu shot.  She wanted know about the RSV vaccine.  She is hesitant to do the COVID mRNA vaccine.  This is because of a lot of anecdotal experience about people she know who have had significant cardiotoxicity from COVID mRNA vaccine.  We discussed the fact that RSV vaccine is new but similar technology to flu shot.  Similar side effect profile.  Did discuss RSV virology and significance.  Overall benefit outweighs the risk.  She is going to have the RSV vaccine after he took a shared decision making on it.  She will have  at a commercial pharmacy.    CT Chest data -  Narrative & Impression  CLINICAL DATA:  Follow-up bronchiectasis   EXAM: CT CHEST WITHOUT CONTRAST   TECHNIQUE: Multidetector CT imaging of the chest was performed following the standard protocol without IV contrast.   COMPARISON:  05/25/2016   FINDINGS: Cardiovascular: No significant vascular findings. Normal heart size. Small pericardial effusion.   Mediastinum/Nodes: No enlarged mediastinal, hilar, or axillary lymph nodes. Thyroid

## 2023-08-17 NOTE — Patient Instructions (Addendum)
Bronchiectasis without complication (HCC)  -Clinically stable with essentially being asymptomatic -Pulmonary function test is normal in 2024 and only shows age-related decline compared to 10 years ago -Glad you are up-to-date with the flu shot  Plan - Continue supportive care  Pre-operative respiratory examination Scoliosis of thoracic spine, unspecified scoliosis type  -Expected surgeries December 02, 2022 for major spine surgery for scoliosis and related pain -Currently we will put you as an acceptable risk for the spine surgery based on your lung disease  Plan - Return in 3 months mid January 2025 for preop formal clearance  Follow-up - 3  months or sooner if needed

## 2023-08-17 NOTE — Patient Instructions (Signed)
Spirometry and DLCO Performed Today.  

## 2023-08-18 ENCOUNTER — Other Ambulatory Visit: Payer: Self-pay | Admitting: Obstetrics and Gynecology

## 2023-08-18 DIAGNOSIS — N390 Urinary tract infection, site not specified: Secondary | ICD-10-CM

## 2023-08-20 DIAGNOSIS — M5412 Radiculopathy, cervical region: Secondary | ICD-10-CM | POA: Diagnosis not present

## 2023-08-23 ENCOUNTER — Encounter: Payer: Self-pay | Admitting: Obstetrics and Gynecology

## 2023-08-23 ENCOUNTER — Ambulatory Visit: Payer: Medicare PPO | Admitting: Obstetrics and Gynecology

## 2023-08-23 DIAGNOSIS — Z87440 Personal history of urinary (tract) infections: Secondary | ICD-10-CM

## 2023-08-23 DIAGNOSIS — N390 Urinary tract infection, site not specified: Secondary | ICD-10-CM

## 2023-08-23 MED ORDER — METHENAMINE HIPPURATE 1 G PO TABS: 1.0000 g | ORAL_TABLET | Freq: Two times a day (BID) | ORAL | 3 refills | Status: DC

## 2023-08-23 MED ORDER — NITROFURANTOIN MONOHYD MACRO 100 MG PO CAPS: 100.0000 mg | ORAL_CAPSULE | Freq: Every day | ORAL | 1 refills | Status: DC

## 2023-08-23 NOTE — Progress Notes (Signed)
[facility] Return Visit  SUBJECTIVE  History of Present Illness: Cynthia Thornton is a 77 y.o. female seen in follow-up for rUTI. Plan at last visit was do daily low dose Macrobid.  Patient reports she has been doing daily low dose macrobid and has had a significant improvement in her quality of life.      Past Medical History: Patient  has a past medical history of Arthritis, Breast cancer, left [facility]) (oncologist-- dr Pamelia Hoit), Bronchiectasis [facility]), Dyslipidemia, Eczema, Family history of breast cancer, Family history of colon cancer, GERD (gastroesophageal reflux disease), Hiatal hernia, History of esophageal stricture, History of external beam radiation therapy, History of hyperthyroidism, Insomnia, Multinodular goiter (last thyroid ultrasound in epic 12-18-2018), Osteopenia (02/2018), Personal history of colonic polyps (09/06/2001), Pityriasis rosea, PONV (postoperative nausea and vomiting), Primary osteoarthritis of left knee, Rhomboid pain, and Wears contact lenses.   Past Surgical History: She  has a past surgical history that includes Video bronchoscopy (Bilateral, 03/16/2015); Breast lumpectomy with radioactive seed and sentinel lymph node biopsy (Left, 11/02/2017); Breast surgery (2000); Vaginal hysterectomy (1991); Tonsillectomy (age 59); Tubal ligation (Bilateral, yrs ago); and Laparoscopic bilateral salpingo oophorectomy (Bilateral, 10/13/2019).   Medications: She has a current medication list which includes the following prescription(s): b-complex with vitamin c, cetirizine, vitamin d, clorazepate, cranberry, dicyclomine, famotidine, methocarbamol, omeprazole, ondansetron, probiotic product, methenamine, and nitrofurantoin (macrocrystal-monohydrate).   Allergies: Patient is allergic to doxycycline, other, oxycodone, sulfa antibiotics, trimethoprim sulfate [trimethoprim], codeine, and propranolol hcl.   Social History: Patient  reports that she quit smoking about 27 years  ago. Her smoking use included cigarettes. She started smoking about 57 years ago. She has a 3 pack-year smoking history. She has never used smokeless tobacco. She reports current alcohol use. She reports that she does not use drugs.      OBJECTIVE     Physical Exam: Vitals:   08/23/23 1402  BP: 126/79  Pulse: 67   Gen: No apparent distress, A&O x 3.  Detailed Urogynecologic Evaluation:  Deferred.    ASSESSMENT AND PLAN    Cynthia Thornton is a 77 y.o. with:  1. Recurrent urinary tract infection    Patient requested to continue on the Macrobid for one more cycle. We discussed that continuing the antibiotics for too long is not healthy and she reports understanding that after another cycle we will need to discontinue or at least do an antibiotic holiday.   Patient to follow up in 6 months or sooner if needed.

## 2023-08-23 NOTE — Patient Instructions (Addendum)
Continue on the Nitrofurantoin for your recurrent UTI's. At your next visit we will discuss discontinuing.

## 2023-08-30 DIAGNOSIS — M5412 Radiculopathy, cervical region: Secondary | ICD-10-CM | POA: Diagnosis not present

## 2023-09-18 ENCOUNTER — Other Ambulatory Visit: Payer: Self-pay | Admitting: Obstetrics & Gynecology

## 2023-09-19 NOTE — Telephone Encounter (Signed)
Medication refill request: valtrex 500 Last AEX:  05/05/22 Next AEX: not schedule  Last MMG (if hormonal medication request): n/a Refill authorized: #[id number] with no RF

## 2023-10-03 ENCOUNTER — Encounter: Payer: Self-pay | Admitting: Obstetrics and Gynecology

## 2023-10-03 DIAGNOSIS — Z1231 Encounter for screening mammogram for malignant neoplasm of breast: Secondary | ICD-10-CM | POA: Diagnosis not present

## 2023-10-08 ENCOUNTER — Other Ambulatory Visit: Payer: Self-pay | Admitting: *Deleted

## 2023-10-08 DIAGNOSIS — R921 Mammographic calcification found on diagnostic imaging of breast: Secondary | ICD-10-CM

## 2023-11-07 ENCOUNTER — Telehealth: Payer: Self-pay

## 2023-11-07 NOTE — Telephone Encounter (Signed)
 Received surgical clearance form from [facility] in [place], [place]- Pt is scheduled for T2 Iliac wing instrumentation and fusion, ponte osteotomies, L5-S1 Globus TLIF, bone autograft, lifenet allograft, evoked potential monitoring w/ Dr. Walterine on 12/03/23. Pt scheduled for surgical clearance with Dr. Amon on 11/12/23

## 2023-11-08 ENCOUNTER — Telehealth: Payer: Self-pay | Admitting: Internal Medicine

## 2023-11-08 DIAGNOSIS — M546 Pain in thoracic spine: Secondary | ICD-10-CM | POA: Diagnosis not present

## 2023-11-08 DIAGNOSIS — M4004 Postural kyphosis, thoracic region: Secondary | ICD-10-CM | POA: Diagnosis not present

## 2023-11-08 NOTE — Telephone Encounter (Signed)
 Fax received from Dr. Willma Harari with [facility] to perform a T2-Iliac wing instrumentation and fusion, ponte osteotomies, L5-S1 Globus TLIF, bone autograft, lifenet allograft, evoked potential monitoring on patient under general anesthesia.  Patient needs surgery clearance. Surgery is 12/03/23. Patient was seen on . Office protocol is a risk assessment can be sent to surgeon if patient has been seen in 60 days or less.   Pt has appt with Dr. Geronimo for 11/13/23. Will route to surg clearance pool for now until her eval is done.

## 2023-11-12 ENCOUNTER — Encounter: Payer: Self-pay | Admitting: Internal Medicine

## 2023-11-12 ENCOUNTER — Ambulatory Visit: Payer: Medicare PPO | Admitting: Internal Medicine

## 2023-11-12 VITALS — BP 126/78 | HR 71 | Temp 98.1°F | Resp 16 | Ht 62.0 in | Wt 146.5 lb

## 2023-11-12 DIAGNOSIS — F419 Anxiety disorder, unspecified: Secondary | ICD-10-CM

## 2023-11-12 DIAGNOSIS — Z79899 Other long term (current) drug therapy: Secondary | ICD-10-CM

## 2023-11-12 DIAGNOSIS — Z09 Encounter for follow-up examination after completed treatment for conditions other than malignant neoplasm: Secondary | ICD-10-CM | POA: Diagnosis not present

## 2023-11-12 DIAGNOSIS — Z01818 Encounter for other preprocedural examination: Secondary | ICD-10-CM | POA: Diagnosis not present

## 2023-11-12 DIAGNOSIS — N390 Urinary tract infection, site not specified: Secondary | ICD-10-CM

## 2023-11-12 NOTE — Assessment & Plan Note (Addendum)
 Preop evaluation: Has a long history of spine problems, eventually referred by local orthopedics to the [facility],  scheduled to have   back surgery next month. No known history of heart disease however on a recent imaging study she was told has  coronary calcifications. She denies chest pain, EKG today: Sinus rhythm. Will see cardiology due to coronary calcifications 11/20/2023. She also has bronchiectasis but that seems to be silent at this point.  Has an appointment to see pulmonary tomorrow She has recurrent urinary infections, currently asymptomatic. Plan: CMP, CBC, UA urine culture. Recommend to see pulmonary and cardiology as planned. She is clear from my side. Anxiety - Insomnia : On Tranxene , check UDS. Preventive care: Reports she had a PNM 20, RSV and flu shot at the pharmacy.  Reluctant to get a COVID-vaccine RTC 02/2024 for physical exam

## 2023-11-12 NOTE — Patient Instructions (Addendum)
   GO TO THE LAB : Get the blood work     Next visit with me May 2025, physical exam.    Please schedule it at the front desk

## 2023-11-12 NOTE — Progress Notes (Signed)
 Subjective:    Patient ID: Cynthia Thornton, female    DOB: 10-03-1946, 78 y.o.   MRN: [id number]  DOS:  11/12/2023 Type of visit - description: Surgical clearance  To have spinal  surgery 12/03/2023. The pain has been severe, on and off.  Denies fever or chills. No chest pain, no difficulty breathing. No major problems with cough or sputum production. No diarrhea or blood in the stools No dysuria or gross hematuria  Review of Systems See above   Past Medical History:  Diagnosis Date   Arthritis    Breast cancer, left [facility]) oncologist-- dr odean   dx 09-26-2017--- IDC, Stage IA, Grade 2, ER/PR +;  HER2 negative;  s/p  breast lumpectomy w/ node dissection 11-02-2017;  completed radiation 01-09-2018 (left breast genetic screening panel 2017 with variant of unknown significance AXINA)   Bronchiectasis (HCC)    pulmologist-- dr rosiland-- w/ chronic lingular scarring  (last exceratbation bronchiectasis 07/ 2020)   Dyslipidemia    followed by cardiology/ vascular-- dr g. adams ([facility] in [place])  family history strokes   Eczema    Family history of breast cancer    Family history of colon cancer    GERD (gastroesophageal reflux disease)    Hiatal hernia    History of esophageal stricture    s/p dilatation 2008   History of external beam radiation therapy    left breast 12-19-2017  to 01-09-2018   History of hyperthyroidism    endocrinologist-- (lov 04-11-2019 epic) dr kassie, dx 2009 due to multinodular goiter ,  had taken medication for few months 2010 stopped due to norma TFT;     Insomnia    Multinodular goiter last thyroid  ultrasound in epic 12-18-2018   endocrinologist-- dr kassie,  dx 2009 w/ hyperthroidism with medication until normal TFT in 2010;     Osteopenia 02/2018   T score -2.4 distal third of radius.  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   Wears contact lenses     Past Surgical History:  Procedure Laterality Date   BREAST LUMPECTOMY WITH RADIOACTIVE SEED AND SENTINEL LYMPH NODE BIOPSY Left 11/02/2017   Procedure: LEFT BREAST LUMPECTOMY WITH RADIOACTIVE SEED AND LEFT AXILLARY DEEP SENTINEL LYMPH NODE BIOPSY, INJECT BLUE DYE LEFT BREAST;  Surgeon: Gail Favorite, MD;  Location: [facility];  Service: General;  Laterality: Left;   BREAST SURGERY  2000   Breast cyst removed, left   LAPAROSCOPIC BILATERAL SALPINGO OOPHERECTOMY Bilateral 10/13/2019   Procedure: LAPAROSCOPIC BILATERAL SALPINGO OOPHORECTOMY LYSIS OF ADHESIONS PERITONEAL WASHINGS ;  Surgeon: Lavoie, Marie-Lyne, MD;  Location: [facility];  Service: Gynecology;  Laterality: Bilateral;  request 7:30am OR time in [place] [facility]yn block requests one hour   TONSILLECTOMY  age 48   TUBAL LIGATION Bilateral yrs ago   VAGINAL HYSTERECTOMY  1991   Right ovarian cystectomy   VIDEO BRONCHOSCOPY Bilateral 03/16/2015   Procedure: VIDEO BRONCHOSCOPY WITHOUT FLUORO;  Surgeon: Dorethia Cave, MD;  Location: [facility];  Service: Endoscopy;  Laterality: Bilateral;    Current Outpatient Medications  Medication Instructions  B Complex-C (B-COMPLEX WITH VITAMIN C) tablet 1 tablet, Daily   cetirizine (ZYRTEC) 10 mg, Daily   clorazepate  (TRANXENE ) 7.5 mg, Oral, At bedtime PRN   CRANBERRY PO 2 tablets, 2 times daily   dicyclomine  (BENTYL ) 20 mg, Oral, 3 times daily PRN   famotidine  (PEPCID ) 20 mg, Oral, Daily   methenamine  (HIPREX ) 1 g, Oral, 2 times daily with meals, Take with 500mg  of vitamin c w/ each tablet   methocarbamol (ROBAXIN) 500 mg, Every 8 hours PRN   nitrofurantoin  (macrocrystal-monohydrate)  (MACROBID ) 100 mg, Oral, Daily   omeprazole  (PRILOSEC) 40 mg, Oral, Daily   ondansetron  (ZOFRAN -ODT) 4 mg, Oral, Every 8 hours PRN   Probiotic Product (PROBIOTIC PO) Take by mouth.   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   Vitamin D  2,000 Units, Daily       Objective:   Physical Exam BP 126/78   Pulse 71   Temp 98.1 F (36.7 C) (Oral)   Resp 16   Ht 5' 2 (1.575 m)   Wt 146 lb 8 oz (66.5 kg)   SpO2 97%   BMI 26.80 kg/m  General:   Well developed, NAD, BMI noted.  HEENT:  Normocephalic . Face symmetric, atraumatic Lungs:  CTA B Normal respiratory effort, no intercostal retractions, no accessory muscle use. Heart: RRR,  no murmur.  Abdomen:  Not distended, soft, non-tender. No rebound or rigidity.   Skin: Not pale. Not jaundice Lower extremities: no pretibial edema bilaterally  Neurologic:  alert & oriented X3.  Speech normal, gait appropriate for age and unassisted Scoliosis noted. Psych--  Cognition and judgment appear intact.  Cooperative with normal attention span and concentration.  Behavior appropriate. No anxious or depressed appearing.     Assessment     ASSESSMENT Bronchiectasis Dr Geronimo Anxiety - Insomnia : tranxene    At hs, rx by gyn, previously, I'll Rx if needed  Endocrinology Dr. Kassie q 3 years, Last visit 03/2015 --H/o thyrotoxicosis on remission --Goiter   Osteopenia --T score -1.5  (2015), dexa 02-2016 , dexa 03/19/2018 (at gyn) DJD -- [facility]: GERD, HH and esophageal stricture EGD 2007, colon polyps Breast cancer, DX 08-2017, s/p lumpectomy,  XRT, B salpingo-oophorectomy Recurrent UTIs/OAB., onset ~ 2018.  Saw urogynecology [phone]   PLAN Preop evaluation: Has a long history of spine problems, eventually referred by local orthopedics to the [facility],  scheduled to have   back surgery next month. No known history of heart disease however on a recent imaging study she was told has  coronary  calcifications. She denies chest pain, EKG today: Sinus rhythm. Will see cardiology due to coronary calcifications 11/20/2023. She also has bronchiectasis but that seems to be silent at this point.  Has an appointment to see pulmonary tomorrow She has recurrent urinary infections, currently asymptomatic. Plan: CMP, CBC, UA urine culture. Recommend to see pulmonary and cardiology as planned. She is clear from my side. Anxiety - Insomnia : On Tranxene , check UDS. Preventive care: Reports she had a PNM 20, RSV and flu shot at the pharmacy.  Reluctant to get a COVID-vaccine RTC 02/2024 for physical exam

## 2023-11-12 NOTE — Telephone Encounter (Signed)
 Completed form faxed back to Dr. Elvina Sidle at [phone]. Form sent for scanning.

## 2023-11-13 ENCOUNTER — Encounter: Payer: Self-pay | Admitting: Internal Medicine

## 2023-11-13 ENCOUNTER — Ambulatory Visit: Payer: Medicare PPO | Admitting: Internal Medicine

## 2023-11-13 ENCOUNTER — Encounter: Payer: Self-pay | Admitting: *Deleted

## 2023-11-13 VITALS — BP 119/79 | HR 69 | Ht 62.5 in | Wt 146.6 lb

## 2023-11-13 DIAGNOSIS — M419 Scoliosis, unspecified: Secondary | ICD-10-CM

## 2023-11-13 DIAGNOSIS — J479 Bronchiectasis, uncomplicated: Secondary | ICD-10-CM | POA: Diagnosis not present

## 2023-11-13 DIAGNOSIS — Z01811 Encounter for preprocedural respiratory examination: Secondary | ICD-10-CM | POA: Diagnosis not present

## 2023-11-13 LAB — CBC WITH DIFFERENTIAL/PLATELET
Basophils Absolute: 0.1 10*3/uL (ref 0.0–0.1)
Basophils Relative: 1.1 % (ref 0.0–3.0)
Eosinophils Absolute: 0.2 10*3/uL (ref 0.0–0.7)
Eosinophils Relative: 3.8 % (ref 0.0–5.0)
HCT: 36.5 % (ref 36.0–46.0)
Hemoglobin: 12.6 g/dL (ref 12.0–15.0)
Lymphocytes Relative: 31.9 % (ref 12.0–46.0)
Lymphs Abs: 2 10*3/uL (ref 0.7–4.0)
MCHC: 34.5 g/dL (ref 30.0–36.0)
MCV: 99.5 fL (ref 78.0–100.0)
Monocytes Absolute: 0.5 10*3/uL (ref 0.1–1.0)
Monocytes Relative: 8.6 % (ref 3.0–12.0)
Neutro Abs: 3.5 10*3/uL (ref 1.4–7.7)
Neutrophils Relative %: 54.6 % (ref 43.0–77.0)
Platelets: 273 10*3/uL (ref 150.0–400.0)
RBC: 3.66 Mil/uL — ABNORMAL LOW (ref 3.87–5.11)
RDW: 12.8 % (ref 11.5–15.5)
WBC: 6.4 10*3/uL (ref 4.0–10.5)

## 2023-11-13 LAB — URINE CULTURE
MICRO NUMBER:: 15946932
Result:: NO GROWTH
SPECIMEN QUALITY:: ADEQUATE

## 2023-11-13 LAB — URINALYSIS, ROUTINE W REFLEX MICROSCOPIC
Bilirubin Urine: NEGATIVE
Hgb urine dipstick: NEGATIVE
Ketones, ur: NEGATIVE
Leukocytes,Ua: NEGATIVE
Nitrite: NEGATIVE
RBC / HPF: NONE SEEN (ref 0–?)
Specific Gravity, Urine: <=1.005 — AB (ref 1.000–1.030)
Total Protein, Urine: NEGATIVE
Urine Glucose: NEGATIVE
Urobilinogen, UA: 0.2 (ref 0.0–1.0)
pH: 6 (ref 5.0–8.0)

## 2023-11-13 LAB — COMPREHENSIVE METABOLIC PANEL
ALT: 19 U/L (ref 0–35)
AST: 20 U/L (ref 0–37)
Albumin: 4.6 g/dL (ref 3.5–5.2)
Alkaline Phosphatase: 76 U/L (ref 39–117)
BUN: 21 mg/dL (ref 6–23)
CO2: 27 meq/L (ref 19–32)
Calcium: 9.3 mg/dL (ref 8.4–10.5)
Chloride: 98 meq/L (ref 96–112)
Creatinine, Ser: 0.95 mg/dL (ref 0.40–1.20)
GFR: 58.55 mL/min — ABNORMAL LOW (ref >60.00)
Glucose, Bld: 80 mg/dL (ref 70–99)
Potassium: 4.1 meq/L (ref 3.5–5.1)
Sodium: 134 meq/L — ABNORMAL LOW (ref 135–145)
Total Bilirubin: 0.4 mg/dL (ref 0.2–1.2)
Total Protein: 6.7 g/dL (ref 6.0–8.3)

## 2023-11-13 NOTE — Progress Notes (Signed)
 RN successfully faxed pre-operative input form to [facility].

## 2023-11-13 NOTE — Progress Notes (Signed)
 OV 10/04/2018  Subjective:  Patient ID: Cynthia Thornton, female , DOB: [birth year] , age 78 y.o. , MRN: [id number] , ADDRESS: [address] [place] [address]   10/04/2018 -   Chief Complaint  Patient presents with   Follow-up    f/u bronchiectasis, pt reports she is doing well , flutter valve has helped her get mucus up, some cough but has gotten better, mucus is thinner     HPI Cynthia Thornton 78 y.o. -presents for follow-up of her mild bronchiectasis with mild sputum production.  Last visit September 2019 we introduce flutter valve after discussing several options because sputum volume had increased.  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  She occasionally uses Mucinex.  She tries to avoid medicines.  She is up-to-date with her pulmonary vaccines.  She would benefit from a new Shingrix  shingles vaccine which she will talk to her primary care about.     ROS - per HPI   OV 08/12/2020  Subjective:  Patient ID: Cynthia Thornton, female , DOB: 1946/02/05 , age 31 y.o. , MRN: [id number] , ADDRESS: [address] [place] [address] PCP Amon Aloysius BRAVO, MD Patient Care Team: Amon Aloysius BRAVO, MD as PCP - Diedre Melodi Lerner, MD as Consulting Physician (Orthopedic Surgery) Odean Potts, MD as Consulting Physician (Hematology and Oncology) Gail Favorite, MD as Consulting Physician (General Surgery) Fontaine, Evalene SQUIBB, MD (Inactive) as Consulting Physician (Gynecology) Geronimo Amel, MD as Consulting Physician (Pulmonary Disease) Dewey Rush, MD as Consulting Physician (Radiation Oncology) Crawford Morna Pickle, NP as Nurse Practitioner (Hematology and Oncology) Cam Morene ORN, MD as Attending Physician (Urology) Carlie Clark, MD as Consulting Physician (Otolaryngology)  This Provider for this visit: Treatment Team:  Attending Provider: Geronimo Amel, MD    08/12/2020 -   Chief Complaint  Patient presents with   Follow-up     Bronchiectasis, doing well     HPI Cynthia Thornton 78 y.o. -follow-up for mild bronchiectasis.  Last seen in December 2019.  Then after the onset of the pandemic and never saw her.  She has been isolating.  She is fully vaccinated.  In July 2020 after exposure to work she had a bronchiectasis exacerbation.  She says since then she has been doing well.  She says that after seeing the nurse practitioner and starting Mucinex as needed and flutter valve she has been doing well.  There is no acute issues.     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 No evidence of acute cardiopulmonary disease. Electronically Signed   By: Pinkie Pebbles M.D.   On: 05/25/2016 17:16   ROS - per HPI    OV 08/22/2021  Subjective:  Patient ID: Cynthia Thornton, female , DOB: 01/17/1946 , age 68 y.o. , MRN: [id number] , ADDRESS: [address] [place] [address] PCP Amon Aloysius BRAVO, MD Patient Care Team: Amon Aloysius BRAVO, MD as PCP - Diedre Melodi Lerner, MD as Consulting Physician (Orthopedic Surgery) Odean Potts, MD as Consulting Physician (Hematology and Oncology) Gail Favorite, MD as Consulting Physician (General Surgery) Fontaine, Evalene SQUIBB, MD (Inactive) as Consulting Physician (Gynecology) Geronimo Amel, MD as Consulting Physician (Pulmonary Disease) Dewey Rush, MD as Consulting Physician (Radiation Oncology) Crawford, Morna Pickle, NP as Nurse Practitioner (Hematology and Oncology) Cam Morene ORN, MD as Attending Physician (Urology) Carlie Clark,  MD as Consulting Physician (Otolaryngology)  This Provider for this visit: Treatment Team:  Attending Provider: Geronimo Amel, MD    08/22/2021 -   Chief Complaint  Patient presents with   Follow-up    Pt states she has been doing well since last visit. States that she has not been coughing  up any blood.     HPI Cynthia Thornton 78 y.o. -follow-up mild bronchiectasis.  Last CT scan of the chest 2017.  Last pulmonary function test 2014.  Is a 1 year follow-up.  She continues to do well.  No respiratory symptoms other than a couple of episodes of mild hemoptysis in the last 1 year.  Up-to-date with flu shot.  In July 2022 she had COVID booster but now that is the by Valent COVID booster and she wants to know if she can or should have it.  She has never had COVID.  Overall doing well without any complaints.     OV 08/28/2022  Subjective:  Patient ID: Cynthia Thornton, female , DOB: 1946/08/19 , age 78 y.o. , MRN: [id number] , ADDRESS: [address] [place] [address] PCP Amon Aloysius BRAVO, MD Patient Care Team: Amon Aloysius BRAVO, MD as PCP - Diedre Melodi Lerner, MD as Consulting Physician (Orthopedic Surgery) Odean Potts, MD as Consulting Physician (Hematology and Oncology) Gail Favorite, MD as Consulting Physician (General Surgery) Geronimo Amel, MD as Consulting Physician (Pulmonary Disease) Dewey Rush, MD as Consulting Physician (Radiation Oncology) Crawford, Morna Pickle, NP as Nurse Practitioner (Hematology and Oncology) Cam Morene ORN, MD as Attending Physician (Urology) Carlie Clark, MD as Consulting Physician (Otolaryngology) Myra Zachary CROME. (Inactive) (Cardiology) Lavoie, Marie-Lyne, MD as Consulting Physician (Obstetrics and Gynecology)  This Provider for this visit: Treatment Team:  Attending Provider: Geronimo Amel, MD    08/28/2022 -   Chief Complaint  Patient presents with   Follow-up    Pt states she has been doing okay since last visit. States she needs a new flutter valve.     HPI Cynthia Thornton 78 y.o. -returns for follow-up.  Its been a year since I last saw her.  No new health issues.  No ER visits no hospitalizations no new symptoms no changes in medications.  She continues to be stable no further hemoptysis.  Last CT scan of the  chest was 1 year ago last pulmonary function test was in 2014.  She has questions about respiratory vaccines.  She has had a flu shot.  She wanted know about the RSV vaccine.  She is hesitant to do the COVID mRNA vaccine.  This is because of a lot of anecdotal experience about people she know who have had significant cardiotoxicity from COVID mRNA vaccine.  We discussed the fact that RSV vaccine is new but similar technology to flu shot.  Similar side effect profile.  Did discuss RSV virology and significance.  Overall benefit outweighs the risk.  She is going to have the RSV vaccine after he took a shared decision making on it.  She will have  at a commercial pharmacy.    CT Chest data -  Narrative & Impression  CLINICAL DATA:  Follow-up bronchiectasis   EXAM: CT CHEST WITHOUT CONTRAST   TECHNIQUE: Multidetector CT imaging of the chest was performed following the standard protocol without IV contrast.   COMPARISON:  05/25/2016   FINDINGS: Cardiovascular: No significant vascular findings. Normal heart size. Small pericardial effusion.   Mediastinum/Nodes: No enlarged mediastinal, hilar, or axillary lymph nodes. Thyroid   gland, trachea, and esophagus demonstrate no significant findings.   Lungs/Pleura: Mild, subpleural radiation fibrosis of the anterior left upper lobe (series 3, image 64). Unchanged mild bronchiectasis, fibrotic scarring and volume loss of the medial segment right middle lobe and lingula (series 3, image 102). There are new, scattered centrilobular and tree-in-bud nodules within the medial segment right middle lobe (series 3, image 102). No pleural effusion or pneumothorax.   Upper Abdomen: No acute abnormality. Multiple lobulated, fluid attenuation cysts or hemangiomata of the liver, unchanged.   Musculoskeletal: No chest wall mass or suspicious bone lesions identified. Surgical clips in the left breast. Dextroscoliosis of the thoracic spine. Benign vertebral  body hemangioma of T9 (series 6, image 70).   IMPRESSION: 1. Unchanged mild bronchiectasis, fibrotic scarring and volume loss of the medial segment right middle lobe and lingula. 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 2. There are new, scattered centrilobular and tree-in-bud nodules within the medial segment right middle lobe, again consistent with atypical mycobacterial infection. 3. Mild, subpleural radiation fibrosis of the anterior left upper lobe.     Electronically Signed   By: Marolyn JONETTA Jaksch M.D.   On: 08/30/2021 16:08    No results found.   OV 08/17/2023  Subjective:  Patient ID: Cynthia Thornton, female , DOB: [birth year] , age 8 y.o. , MRN: [id number] , ADDRESS: [address] [place] [address] PCP Amon Aloysius BRAVO, MD Patient Care Team: Amon Aloysius BRAVO, MD as PCP - Diedre Melodi Lerner, MD as Consulting Physician (Orthopedic Surgery) Odean Potts, MD as Consulting Physician (Hematology and Oncology) Gail Favorite, MD as Consulting Physician (General Surgery) Geronimo Amel, MD as Consulting Physician (Pulmonary Disease) Dewey Rush, MD as Consulting Physician (Radiation Oncology) Crawford, Morna Pickle, NP as Nurse Practitioner (Hematology and Oncology) Cam Morene ORN, MD as Attending Physician (Urology) Carlie Clark, MD as Consulting Physician (Otolaryngology) Myra Zachary CROME. (Inactive) (Cardiology) Lavoie, Marie-Lyne, MD as Consulting Physician (Obstetrics and Gynecology)  This Provider for this visit: Treatment Team:  Attending Provider: Geronimo Amel, MD    08/17/2023 -   Chief Complaint  Patient presents with   Follow-up    PFT done today. Breathing is doing well.       HPI SAROYA RICCOBONO 77 y.o. -  #Mild bronchiectasis follow-up.  She returns for routine follow-up.  We decided do a pulmonary function test.  Essentially normal.  Is age-related decline  compared to 10 years ago but the test is normal.  She remains asymptomatic.  No further hemoptysis.  She already had the flu shot  #Preop clearance: This is a new issue.  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  She has physical therapy through [facility] special program [facility].  [facility].  On December 03, 2023 they are proposing major spine surgery.  She wanted clearance.  I did approve the surgery particularly with her lung function being normal and her being minimally symptomatic but did caution her against any respiratory infection in the weeks leading up to the surgery.  We then took a shared decision making for her to return in mid January 2025 approximately 3 weeks before the surgery for a routine pulm evaluation preop.  She is agreeable to this plan.    OV 11/13/2023  Subjective:  Patient ID: Cynthia Thornton, female , DOB: [birth year] , age 67 y.o. , MRN: [id number] , ADDRESS: [address] [place] [address] PCP Amon Aloysius BRAVO, MD Patient  Care Team: Amon Aloysius BRAVO, MD as PCP - Diedre Melodi Lerner, MD as Consulting Physician (Orthopedic Surgery) Odean Potts, MD as Consulting Physician (Hematology and Oncology) Gail Favorite, MD as Consulting Physician (General Surgery) Geronimo Amel, MD as Consulting Physician (Pulmonary Disease) Dewey Rush, MD as Consulting Physician (Radiation Oncology) Crawford, Morna Pickle, NP as Nurse Practitioner (Hematology and Oncology) Cam Morene ORN, MD as Attending Physician (Urology) Carlie Clark, MD as Consulting Physician (Otolaryngology) Myra Zachary CROME. (Inactive) (Cardiology) Georgia Blonder, MD as Consulting Physician (Obstetrics and Gynecology)  This Provider for this visit: Treatment Team:  Attending Provider: Geronimo Amel, MD    11/13/2023 -   Chief Complaint  Patient presents with   Follow-up    Surgical clearance, pt denies any concerns      HPI JAMECA CHUMLEY 78 y.o. -     PFT      Latest Ref Rng & Units 08/17/2023    8:16 AM 10/20/2013    8:37 AM  ILD indicators  FVC-Pre L 2.63  3.34   FVC-Predicted Pre % 102  106   FVC-Post L  3.49   FVC-Predicted Post %  111   DLCO uncorrected ml/min/mmHg 18.37    DLCO UNC %Pred % 102    DLCO Corrected ml/min/mmHg 18.37    DLCO COR %Pred % 102        LAB RESULTS last 96 hours No results found.  LAB RESULTS last 90 days Recent Results (from the past 2160 hours)  Pulmonary function test     Status: None   Collection Time: 08/17/23  8:16 AM  Result Value Ref Range   FVC-Pre 2.63 L   FVC-%Pred-Pre 102 %   FEV1-Pre 1.84 L   FEV1-%Pred-Pre 95 %   FEV6-Pre 2.60 L   FEV6-%Pred-Pre 106 %   Pre FEV1/FVC ratio 70 %   FEV1FVC-%Pred-Pre 93 %   Pre FEV6/FVC Ratio 99 %   FEV6FVC-%Pred-Pre 104 %   FEF 25-75 Pre 1.13 L/sec   FEF2575-%Pred-Pre 72 %   DLCO unc 18.37 ml/min/mmHg   DLCO unc % pred 102 %   DLCO cor 18.37 ml/min/mmHg   DLCO cor % pred 102 %   DL/VA 5.72 ml/min/mmHg/L   DL/VA % pred 897 %  Comp Met (CMET)     Status: Abnormal   Collection Time: 11/12/23  3:15 PM  Result Value Ref Range   Sodium 134 (L) 135 - 145 mEq/L   Potassium 4.1 3.5 - 5.1 mEq/L   Chloride 98 96 - 112 mEq/L   CO2 27 19 - 32 mEq/L   Glucose, Bld 80 70 - 99 mg/dL   BUN 21 6 - 23 mg/dL   Creatinine, Ser 9.04 0.40 - 1.20 mg/dL   Total Bilirubin 0.4 0.2 - 1.2 mg/dL   Alkaline Phosphatase 76 39 - 117 U/L   AST 20 0 - 37 U/L   ALT 19 0 - 35 U/L   Total Protein 6.7 6.0 - 8.3 g/dL   Albumin 4.6 3.5 - 5.2 g/dL   GFR 41.44 (L) >39.99 mL/min    Comment: Calculated using the CKD-EPI Creatinine Equation (2021)   Calcium  9.3 8.4 - 10.5 mg/dL  CBC w/Diff     Status: Abnormal   Collection Time: 11/12/23  3:15 PM  Result Value Ref Range   WBC 6.4 4.0 - 10.5 K/uL   RBC 3.66 (L) 3.87 - 5.11 Mil/uL   Hemoglobin 12.6 12.0 - 15.0 g/dL   HCT 63.4 63.9 - 53.9 %  MCV 99.5 78.0 - 100.0 fl   MCHC 34.5 30.0 - 36.0 g/dL   RDW 87.1 88.4 - 84.4 %    Platelets 273.0 150.0 - 400.0 K/uL   Neutrophils Relative % 54.6 43.0 - 77.0 %   Lymphocytes Relative 31.9 12.0 - 46.0 %   Monocytes Relative 8.6 3.0 - 12.0 %   Eosinophils Relative 3.8 0.0 - 5.0 %   Basophils Relative 1.1 0.0 - 3.0 %   Neutro Abs 3.5 1.4 - 7.7 K/uL   Lymphs Abs 2.0 0.7 - 4.0 K/uL   Monocytes Absolute 0.5 0.1 - 1.0 K/uL   Eosinophils Absolute 0.2 0.0 - 0.7 K/uL   Basophils Absolute 0.1 0.0 - 0.1 K/uL  Urinalysis, Routine w reflex microscopic     Status: Abnormal   Collection Time: 11/12/23  3:15 PM  Result Value Ref Range   Color, Urine YELLOW Yellow;Lt. Yellow;Straw;Dark Yellow;Amber;Green;Red;Brown   APPearance CLEAR Clear;Turbid;Slightly Cloudy;Cloudy   Specific Gravity, Urine <=1.005 (A) 1.000 - 1.030   pH 6.0 5.0 - 8.0   Total Protein, Urine NEGATIVE Negative   Urine Glucose NEGATIVE Negative   Ketones, ur NEGATIVE Negative   Bilirubin Urine NEGATIVE Negative   Hgb urine dipstick NEGATIVE Negative   Urobilinogen, UA 0.2 0.0 - 1.0   Leukocytes,Ua NEGATIVE Negative   Nitrite NEGATIVE Negative   WBC, UA 0-2/hpf 0-2/hpf   RBC / HPF none seen 0-2/hpf   Squamous Epithelial / HPF Rare(0-4/hpf) Rare(0-4/hpf)         has a past medical history of Arthritis, Breast cancer, left (HCC) (oncologist-- dr odean), Bronchiectasis (HCC), Dyslipidemia, Eczema, Family history of breast cancer, Family history of colon cancer, GERD (gastroesophageal reflux disease), Hiatal hernia, History of esophageal stricture, History of external beam radiation therapy, History of hyperthyroidism, Insomnia, Multinodular goiter (last thyroid  ultrasound in epic 12-18-2018), Osteopenia (02/2018), Personal history of colonic polyps (09/06/2001), Pityriasis rosea, PONV (postoperative nausea and vomiting), Primary osteoarthritis of left knee, Rhomboid pain, and Wears contact lenses.   reports that she quit smoking about 28 years ago. Her smoking use included cigarettes. She started smoking about 58  years ago. She has a 3 pack-year smoking history. She has never used smokeless tobacco.  Past Surgical History:  Procedure Laterality Date   BREAST LUMPECTOMY WITH RADIOACTIVE SEED AND SENTINEL LYMPH NODE BIOPSY Left 11/02/2017   Procedure: LEFT BREAST LUMPECTOMY WITH RADIOACTIVE SEED AND LEFT AXILLARY DEEP SENTINEL LYMPH NODE BIOPSY, INJECT BLUE DYE LEFT BREAST;  Surgeon: Gail Favorite, MD;  Location: [facility];  Service: General;  Laterality: Left;   BREAST SURGERY  2000   Breast cyst removed, left   LAPAROSCOPIC BILATERAL SALPINGO OOPHERECTOMY Bilateral 10/13/2019   Procedure: LAPAROSCOPIC BILATERAL SALPINGO OOPHORECTOMY LYSIS OF ADHESIONS PERITONEAL WASHINGS ;  Surgeon: Lavoie, Marie-Lyne, MD;  Location: [facility];  Service: Gynecology;  Laterality: Bilateral;  request 7:30am OR time in [place] [facility] block requests one hour   TONSILLECTOMY  age 53   TUBAL LIGATION Bilateral yrs ago   VAGINAL HYSTERECTOMY  1991   Right ovarian cystectomy   VIDEO BRONCHOSCOPY Bilateral 03/16/2015   Procedure: VIDEO BRONCHOSCOPY WITHOUT FLUORO;  Surgeon: Dorethia Cave, MD;  [facility];  Service: Endoscopy;  Laterality: Bilateral;    Allergies  Allergen Reactions   Doxycycline  Rash   Other Other (See Comments)    Surgical glue : hives   Oxycodone  Nausea And Vomiting   Sulfa  Antibiotics Hives   Trimethoprim  Sulfate [Trimethoprim ]     rash   Codeine  Nausea Only   Propranolol Hcl Other (See Comments)    Dizziness and heart racing    Immunization History  Administered Date(s) Administered   Influenza Split 08/31/2011, 07/30/2014   Influenza Whole 08/30/2009, 07/30/2010   Influenza, High Dose Seasonal PF 07/05/2018, 08/02/2019, 08/15/2021, 07/29/2022   Influenza,inj,Quad PF,6+ Mos 09/30/2012, 10/10/2013, 07/26/2015   Influenza-Unspecified 08/18/2016, 07/30/2020, 07/01/2023   Moderna Sars-Covid-2 Vaccination 11/29/2019, 12/29/2019, 10/14/2020,  05/19/2021   Pneumococcal Conjugate-13 10/10/2013   Pneumococcal Polysaccharide-23 07/27/2015   Td 12/02/2008   Tdap 01/03/2019   Zoster Recombinant(Shingrix ) 01/03/2019, 04/18/2019   Zoster, Live 12/02/2008    Family History  Problem Relation Age of Onset   Osteoporosis Mother    Lung cancer Mother    Stroke Mother    Diabetes Father    Hypertension Father    Osteoporosis Father    Colon cancer Father 45   Breast cancer Sister        Age 45   Leukemia Maternal Grandfather    Crohn's disease Son    Ovarian cancer Maternal Aunt 12   Breast cancer Maternal Aunt        great aunt- Age unknown   Colon cancer Paternal Uncle 62   Breast cancer Cousin        maternal-Age 42   Colon cancer Cousin        maternal first cousin     Current Outpatient Medications:    B Complex-C (B-COMPLEX WITH VITAMIN C) tablet, Take 1 tablet by mouth daily., Disp: , Rfl:    cetirizine (ZYRTEC) 10 MG tablet, Take 10 mg by mouth daily., Disp: , Rfl:    Cholecalciferol (VITAMIN D ) 2000 units CAPS, Take 2,000 Units by mouth daily., Disp: , Rfl:    clorazepate  (TRANXENE ) 7.5 MG tablet, Take 1 tablet (7.5 mg total) by mouth at bedtime as needed for anxiety., Disp: 30 tablet, Rfl: 1   CRANBERRY PO, Take 2 tablets by mouth in the morning and at bedtime., Disp: , Rfl:    dicyclomine  (BENTYL ) 20 MG tablet, TAKE 1 TABLET (20 MG TOTAL) BY MOUTH 3 (THREE) TIMES DAILY AS NEEDED FOR SPASMS., Disp: 270 tablet, Rfl: 1   famotidine  (PEPCID ) 20 MG tablet, TAKE 1 TABLET BY MOUTH EVERY DAY, Disp: 90 tablet, Rfl: 3   methenamine  (HIPREX ) 1 g tablet, Take 1 tablet (1 g total) by mouth 2 (two) times daily with a meal. Take with 500mg  of vitamin c w/ each tablet, Disp: 180 tablet, Rfl: 3   methocarbamol (ROBAXIN) 500 MG tablet, Take 500 mg by mouth every 8 (eight) hours as needed., Disp: , Rfl:    nitrofurantoin , macrocrystal-monohydrate, (MACROBID ) 100 MG capsule, Take 1 capsule (100 mg total) by mouth daily., Disp: 90  capsule, Rfl: 1   omeprazole  (PRILOSEC) 40 MG capsule, TAKE 1 CAPSULE (40 MG TOTAL) BY MOUTH DAILY., Disp: 90 capsule, Rfl: 3   Probiotic Product (PROBIOTIC PO), Take by mouth., Disp: , Rfl:    valACYclovir  (VALTREX ) 500 MG tablet, TAKE 1 TABLET (500 MG TOTAL) BY MOUTH DAILY. 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   Weight as of this encounter: 146 lb 9.6 oz (66.5 kg).  @WEIGHTCHANGE @  [employer]   11/13/23 1603  Weight: 146 lb 9.6 oz (66.5 kg)     Physical Exam   General: No distress. Looks well O2 at rest: no Cane present: no Sitting in wheel chair: no Frail: no Obese: no Neuro: Alert and Oriented x 3. GCS 15. Speech normal Psych: Pleasant Resp:  Barrel Chest - noo.  Wheeze - no, Crackles - no, No overt respiratory distress . THORACIC SCOLIPSIS + CVS: Normal heart sounds. Murmurs - no Ext: Stigmata of Connective Tissue Disease - no HEENT: Normal upper airway. PEERL +. No post nasal drip        Assessment:       ICD-10-CM   1. Bronchiectasis without complication (HCC)  J47.9     2. Pre-operative respiratory examination  Z01.811     3. Scoliosis of thoracic spine, unspecified scoliosis type  M41.9          Plan:     Patient Instructions  Bronchiectasis without complication (HCC)  -Clinically stable with essentially being asymptomatic -Pulmonary function test is normal in 2024 and only shows age-related decline compared to 10 years ago -Glad you are up-to-date with the flu shot  Plan -  Continue supportive care  Pre-operative respiratory examination Scoliosis of thoracic spine, unspecified scoliosis type  -Expected surgeries December 02, 2022 for major spine surgery for scoliosis and related pain  PLAN -Currently we will put you as an acceptable risk for the spine surgery based on your lung disease - early mobilization post op and incentive spirometry and breathing treatments are key in recovery and avoidng pneumonia  -standard blood clot prevention protocols reocmmended    Follow-up - 6  months or sooner if needed   FOLLOWUP Return in about 6 months (around 05/12/2024) for 15 min visit, with Dr Geronimo, Face to Face OR Video Visit.    SIGNATURE    Dr. Dorethia Geronimo, M.D., F.C.C.P,  Pulmonary and Critical Care Medicine Staff Physician, [facility] Center Director - Interstitial Lung Disease  Program  Pulmonary Fibrosis [facility] at [facility] [place], [place], [address]  Pager: [phone], If no answer or between  15:00h - 7:00h: call [phone] Telephone: [phone]  4:42 PM 11/13/2023   Moderate Complexity MDM OFFICE  2021 E/M guidelines, first released in 2021, with minor revisions added in 2023 and 2024 Must meet the requirements for 2 out of 3 dimensions to qualify.    Number and complexity of problems addressed Amount and/or complexity of data reviewed Risk of complications and/or morbidity  One or more chronic illness with mild exacerbation, OR progression, OR  side effects of treatment  Two or more stable chronic illnesses  One undiagnosed new problem with uncertain prognosis  One acute illness with systemic symptoms   One Acute complicated injury Must meet the requirements for 1 of 3 of the categories)  Category 1: Tests and documents, historian  Any combination of 3 of the following:  Assessment requiring an independent historian  Review of prior external note(s) from each unique  source  Review of results of each unique test  Ordering of each unique test    Category 2: Interpretation of tests   Independent interpretation of a test performed by  another physician/other qualified health care professional (not separately reported)  Category 3: Discuss management/tests  Discussion of management or test interpretation with external physician/other qualified health care professional/appropriate source (not separately reported) Moderate risk of morbidity from additional diagnostic testing or treatment Examples only:  Prescription drug management  Decision regarding minor surgery with identfied patient or procedure risk factors  Decision regarding elective major surgery without identified patient or procedure risk factors  Diagnosis or treatment significantly limited by social determinants of health             HIGh Complexity  OFFICE   2021 E/M guidelines, first released in 2021, with minor revisions added in 2023. Must meet the requirements for 2 out of 3 dimensions to qualify.    Number and complexity of problems addressed Amount and/or complexity of data reviewed Risk of complications and/or morbidity  Severe exacerbation of chronic illness  Acute or chronic illnesses that may pose a threat to life or bodily function, e.g., multiple trauma, acute MI, pulmonary embolus, severe respiratory distress, progressive rheumatoid arthritis, psychiatric illness with potential threat to self or others, peritonitis, acute renal failure, abrupt change in neurological status Must meet the requirements for 2 of 3 of the categories)  Category 1: Tests and documents, historian  Any combination of 3 of the following:  Assessment requiring an independent historian  Review of prior external note(s) from each unique source  Review of results of each unique test  Ordering of each unique test    Category 2: Interpretation of tests    Independent interpretation  of a test performed by another physician/other qualified health care professional (not separately reported)  Category 3: Discuss management/tests  Discussion of management or test interpretation with external physician/other qualified health care professional/appropriate source (not separately reported)  HIGH risk of morbidity from additional diagnostic testing or treatment Examples only:  Drug therapy requiring intensive monitoring for toxicity  Decision for elective major surgery with identified pateint or procedure risk factors  Decision regarding hospitalization or escalation of level of care  Decision for DNR or to de-escalate care   Parenteral controlled  substances            LEGEND - Independent interpretation involves the interpretation of a test for which there is a CPT code, and an interpretation or report is customary. When a review and interpretation of a test is performed and documented by the provider, but not separately reported (billed), then this would represent an independent interpretation. This report does not need to conform to the usual standards of a complete report of the test. This does not include interpretation of tests that do not have formal reports such as a complete blood count with differential and blood cultures. Examples would include reviewing a chest radiograph and documenting in the medical record an interpretation, but not separately reporting (billing) the interpretation of the chest radiograph.   An appropriate source includes professionals who are not health care professionals but may be involved in the management of the patient, such as a clinical research associate, upper officer, case manager or teacher, and does not include discussion with family or informal caregivers.    - SDOH: SDOH are the conditions in the environments where people are born, live, learn, work, play, worship, and age that affect a wide range of health, functioning, and  quality-of-life outcomes and risks. (e.g., housing, food insecurity, transportation, etc.). SDOH-related Z codes ranging from Z55-Z65 are the ICD-10-CM diagnosis codes used to document SDOH data Z55 - Problems related  to education and literacy Z56 - Problems related to employment and unemployment Z57 - Occupational exposure to risk factors Z58 - Problems related to physical environment Z59 - Problems related to housing and economic circumstances [phone] - Problems related to social environment [phone] - Problems related to upbringing [phone] - Other problems related to primary support group, including family circumstances Z91 - Problems related to certain psychosocial circumstances Z65 - Problems related to other psychosocial circumstances

## 2023-11-13 NOTE — Patient Instructions (Addendum)
 Bronchiectasis without complication (HCC)  -Clinically stable with essentially being asymptomatic -Pulmonary function test is normal in 2024 and only shows age-related decline compared to 10 years ago -Glad you are up-to-date with the flu shot  Plan - Continue supportive care  Pre-operative respiratory examination Scoliosis of thoracic spine, unspecified scoliosis type  -Expected surgeries December 02, 2022 for major spine surgery for scoliosis and related pain  PLAN -Currently we will put you as an acceptable risk for the spine surgery based on your lung disease - early mobilization post op and incentive spirometry and breathing treatments are key in recovery and avoidng pneumonia  -standard blood clot prevention protocols reocmmended    Follow-up - 6  months or sooner if needed

## 2023-11-15 ENCOUNTER — Other Ambulatory Visit: Payer: Self-pay

## 2023-11-15 DIAGNOSIS — M7918 Myalgia, other site: Secondary | ICD-10-CM | POA: Insufficient documentation

## 2023-11-15 DIAGNOSIS — L309 Dermatitis, unspecified: Secondary | ICD-10-CM | POA: Insufficient documentation

## 2023-11-15 DIAGNOSIS — Z8719 Personal history of other diseases of the digestive system: Secondary | ICD-10-CM | POA: Insufficient documentation

## 2023-11-15 DIAGNOSIS — Z9889 Other specified postprocedural states: Secondary | ICD-10-CM | POA: Insufficient documentation

## 2023-11-15 DIAGNOSIS — M1712 Unilateral primary osteoarthritis, left knee: Secondary | ICD-10-CM | POA: Insufficient documentation

## 2023-11-15 DIAGNOSIS — Z923 Personal history of irradiation: Secondary | ICD-10-CM | POA: Insufficient documentation

## 2023-11-15 DIAGNOSIS — E785 Hyperlipidemia, unspecified: Secondary | ICD-10-CM | POA: Insufficient documentation

## 2023-11-15 DIAGNOSIS — Z973 Presence of spectacles and contact lenses: Secondary | ICD-10-CM | POA: Insufficient documentation

## 2023-11-15 DIAGNOSIS — Z8639 Personal history of other endocrine, nutritional and metabolic disease: Secondary | ICD-10-CM | POA: Insufficient documentation

## 2023-11-15 DIAGNOSIS — L42 Pityriasis rosea: Secondary | ICD-10-CM | POA: Insufficient documentation

## 2023-11-15 DIAGNOSIS — C50912 Malignant neoplasm of unspecified site of left female breast: Secondary | ICD-10-CM | POA: Insufficient documentation

## 2023-11-15 DIAGNOSIS — M1990 Unspecified osteoarthritis, unspecified site: Secondary | ICD-10-CM | POA: Insufficient documentation

## 2023-11-15 LAB — DM TEMPLATE

## 2023-11-15 LAB — DRUG MONITORING PANEL 375977 , URINE
Alcohol Metabolites: NEGATIVE ng/mL (ref <500)
Alphahydroxyalprazolam: NEGATIVE ng/mL (ref <25)
Alphahydroxymidazolam: NEGATIVE ng/mL (ref <50)
Alphahydroxytriazolam: NEGATIVE ng/mL (ref <50)
Aminoclonazepam: NEGATIVE ng/mL (ref <25)
Amphetamines: NEGATIVE ng/mL (ref <500)
Barbiturates: NEGATIVE ng/mL (ref <300)
Benzodiazepines: POSITIVE ng/mL — AB (ref <100)
Cocaine Metabolite: NEGATIVE ng/mL (ref <150)
Desmethyltramadol: NEGATIVE ng/mL (ref <100)
Hydroxyethylflurazepam: NEGATIVE ng/mL (ref <50)
Lorazepam: NEGATIVE ng/mL (ref <50)
Marijuana Metabolite: NEGATIVE ng/mL (ref <20)
Nordiazepam: NEGATIVE ng/mL (ref <50)
Opiates: NEGATIVE ng/mL (ref <100)
Oxazepam: 296 ng/mL — ABNORMAL HIGH (ref <50)
Oxycodone: NEGATIVE ng/mL (ref <100)
Temazepam: NEGATIVE ng/mL (ref <50)
Tramadol: NEGATIVE ng/mL (ref <100)

## 2023-11-15 NOTE — Telephone Encounter (Signed)
OV note 11/13/23 containing surgical clearance was faxed to [facility]  Nothing further needed

## 2023-11-20 ENCOUNTER — Ambulatory Visit: Payer: Medicare PPO

## 2023-11-20 VITALS — BP 128/82 | HR 73 | Ht 62.6 in | Wt 145.1 lb

## 2023-11-20 DIAGNOSIS — Z01810 Encounter for preprocedural cardiovascular examination: Secondary | ICD-10-CM | POA: Diagnosis not present

## 2023-11-20 DIAGNOSIS — E785 Hyperlipidemia, unspecified: Secondary | ICD-10-CM | POA: Diagnosis not present

## 2023-11-20 DIAGNOSIS — Z9189 Other specified personal risk factors, not elsewhere classified: Secondary | ICD-10-CM | POA: Insufficient documentation

## 2023-11-20 DIAGNOSIS — E7800 Pure hypercholesterolemia, unspecified: Secondary | ICD-10-CM

## 2023-11-20 NOTE — Progress Notes (Addendum)
Cardiology Consultation:    Date:  11/20/2023   ID:  Cynthia Thornton, DOB January 28, 1946, MRN [id number]  PCP:  Wanda Plump, MD  Cardiologist:  Marlyn Corporal Joeanthony Seeling, MD   Referring MD: Earley Favor, MD   No chief complaint on file.    ASSESSMENT AND PLAN:   Cynthia Thornton 78 year old woman with no significant prior cardiac history, has risk factors in the form of dyslipidemia, remote history of smoking quit over 25 years ago, as past medical history of breast cancer s/p left lumpectomy in 2019, recently on mammogram screening she was noted to have breast artery calcifications and given the associated risk for coronary artery disease was referred for further evaluation and also reports upcoming noncardiac surgery for kyphoscoliosis, tentatively scheduled for February 3 at [facility] with Dr.Hey with [facility].  Problem List Items Addressed This Visit     Hypercholesteremia   Last lipid panel available to review from Mar 23, 2023 LDL 104, triglyceride 75, HDL 87, total cholesterol 119.  The 10-year ASCVD risk score (Arnett DK, et al., 2019) is: 16.2%   Values used to calculate the score:     Age: 55 years     Sex: Female     Is Non-Hispanic African American: No     Diabetic: No     Tobacco smoker: No     Systolic Blood Pressure: 128 mmHg     Is BP treated: No     HDL Cholesterol: 87.4 mg/dL     Total Cholesterol: 206 mg/dL  She has made significant changes to her diet and is in the process to having this rechecked through PCPs office. With her overall risk she would benefit from statin therapy and I would recommend discussing further with PCP and if agreeable get started on moderate intensity statin regimen with atorvastatin or Crestor.        Preop cardiovascular exam   Preop cardiovascular exam prior to elective surgery for the spine coming up on February 3 for kyphoscoliosis with Dr.Hey.  No acute cardiac symptoms. Does have cardiovascular risk factors. Functional  status unable to determine if she would meet 4 METS given limited mobility and description of activities through the day.  Now in the setting of significant breast artery calcifications noted on mammogram and referred here for further cardiovascular risk assessment discussed further strategy for breast reduction and further restratification.  She is diligent about her diet and lifestyle and has already made changes to the diet further given since her last lipid panel.  Reviewed further restratification with cardiac stress test with nuclear imaging prior to her upcoming surgery in the setting.  She is agreeable we will proceed with scheduling this at [facility] and review the results and if the study is low risk without  significant abnormalities should be okay to proceed with her surgery as being planned.       Relevant Orders   EKG 12-Lead (Completed)   MYOCARDIAL PERFUSION IMAGING   Cardiac Stress Test: Informed Consent Details: Physician/Practitioner Attestation; Transcribe to consent form and obtain patient signature   Dyslipidemia - Primary   Cardiovascular risk factor   She was noted to have breast artery calcifications which can reflect elevated coronary artery disease risk and was referred here for further evaluation. We reviewed the results from her mammogram and heart evaluation done with it.  In the setting we discussed her overall cardiovascular risk factors, in the setting of her age, prior history of smoking, hyperlipidemia and  also her relatively asymptomatic state.  But somewhat limited functional status due to her spine issues unable to assess if she is able to meet 4 METS activity.  In the setting we discussed further options for cardiovascular restratification and risk reduction strategies.  Healthy diet and lifestyle was discussed with the setting. Follow-up of lipid levels and initiating statin therapy if indicated also reviewed. Further evaluation for any significant  underlying coronary artery disease with cardiac stress test versus alternative imaging modalities reviewed.  Given her upcoming surgery decided to proceed with cardiac stress test with Lexiscan nuclear imaging [cardiac PET stress was discussed but due to scheduling do not expect this to be available within the next month]. Lexiscan nuclear imaging stress test tentatively to be done at [facility].        Addendum 11-28-2023: Results from Lexiscan stress test done today noted low risk study with no evidence of ischemia. This is reassuring.  From cardiac standpoint she is at low risk for perioperative cardiac complications. Okay to proceed with her elective noncardiac surgery for the spine as being planned on February 3.   History of Present Illness:    Cynthia Thornton is a 78 y.o. female who is being seen today for the evaluation of coronary artery disease in the setting of calcifications noted on Mammo+Heart evaluation on recent mammogram and preop cardiovascular risk assessment at the request of Earley Favor, MD. At last visit for cardiology was with Dr. Pernell Dupre at [facility] [place].  Has history of breast cancer s/p lumpectomy left side in 2019, dyslipidemia, remote history of smoking quit 25 years ago [mentions she smoked on and off for 30 years 1 to 2 cigarettes at most in a day], alcohol consumption socially [no more than 1 drink in a day]. Chronic kyphosis of thoracic region and scoliosis of thoracolumbar region and back pain and follows up with spine surgeon Dr. Elvina Sidle  at [facility] and is anticipating surgery on February 3 Denies any significant prior cardiac health history.  Very pleasant woman here for the visit by herself.  lives by herself at home.  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 She keeps herself physically  active with her mobility exercises for kyphoscoliosis, with limited improvement she is planning on getting surgery done tentatively scheduled for February 3 in [place].  She denies any active symptoms of chest pain or shortness of breath.  Functional status is difficult to assess if she is able to meet 4 METS. Denies any other ongoing cardiac symptoms such as palpitation, orthopnea, pedal edema.  No lightheadedness, dizziness or syncopal episodes.  In the clinic today shows sinus rhythm heart rate 73/min, normal PR interval, QRS duration 74 ms.  Ultrasound carotids from February 2020 noted bilateral internal carotids with minimal to no plaque.  No significant obstructive lesions in bilateral extracranial cervical carotid systems.  Recent blood work from 11-29-2023 CBC with hemoglobin 12.6, hematocrit 36.5, platelets normal 273, WBC 6.4. Complete metabolic panel with sodium 134, potassium 4.1, BUN 21, creatinine 0.95, EGFR 58.5 Normal transaminases and alkaline phosphatase  Last lipid panel available to review is from Mar 23, 2023 with total cholesterol 206, HDL 87, LDL 104, triglycerides 75 TSH normal.    Past Medical History:  Diagnosis Date   Annual physical exam 07/27/2015   Anxiety 03/29/2019   Arthritis    Breast cancer, left (  [facility]) oncologist-- dr Pamelia Hoit   dx 09-26-2017--- IDC, Stage IA, Grade 2, ER/PR +;  HER2 negative;  s/p  breast lumpectomy w/ node dissection 11-02-2017;  completed radiation 01-09-2018 (left breast genetic screening panel 2017 with variant of unknown significance AXINA)   Bronchiectasis (HCC)    pulmologist-- dr Sandria Manly-- w/ chronic lingular scarring  (last exceratbation bronchiectasis 07/ 2020)   Change in bowel habits 09/10/2014   COLONIC POLYPS, BENIGN 01/16/2008   Qualifier: Diagnosis of   By: Misty Stanley CMA (AAMA), Amanda         DEGENERATIVE JOINT DISEASE 01/16/2008   Qualifier: Diagnosis of   By: Misty Stanley CMA (AAMA), Amanda      Replacing diagnoses that  were inactivated after the 01/29/23 regulatory import     Dyslipidemia    followed by cardiology/ vascular-- dr g. adams ([facility] in [place])  family history strokes   Eczema    ESOPHAGEAL STRICTURE 04/05/2007   Annotation: per EGD  Qualifier: Diagnosis of   By: Drue Novel MD, Nolon Rod.        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  The Comprehensive Cancer Panel offered by [facility] includes sequencing and/or deletion duplication testing of the following 32 genes: APC, ATM, AXIN2, BARD1, BMPR1A, BRCA1, BRCA2, BRIP1, CDH1, CDK4, CDKN2A, CHEK2, EPCAM, FANCC, MLH1, MSH2, MSH6, MUTYH, NBN, PALB2, PMS2, POLD1, POLE, PTEN, RAD51C, RAD51D, SCG5/GREM1, SMAD4, STK1   GERD 07/30/2006   Qualifier: Diagnosis of   By: Drue Novel MD, Theora Master, MULTINODULAR 11/27/2008   Qualifier: Diagnosis of   By: Everardo All MD, Sean A        HIATAL HERNIA 08/15/2006   Qualifier: Diagnosis of   By: Misty Stanley CMA (AAMA), Amanda      Replacing diagnoses that were inactivated after the 01/29/23 regulatory import     History of esophageal stricture    s/p dilatation 2008   History of external beam radiation therapy    left breast 12-19-2017  to 01-09-2018   History of hyperthyroidism    endocrinologist-- (lov 04-11-2019 epic) dr Everardo All, dx 2009 due to multinodular goiter ,  had taken medication for few months 2010 stopped due to norma TFT;     Hypercholesteremia 10/03/2010   Overview:   Chronic [Hypercholesterolemia]     Insomnia    Irritation of left eye 02/07/2022   Malignant neoplasm of lower-inner quadrant of left breast in female, estrogen receptor positive (HCC) 10/02/2017   Osteopenia 02/2018   T score -2.4 distal third of radius.  FRAX 11% / 1.8% stable at other points of interest to include spine, right and left hip   Pityriasis rosea    PONV (postoperative nausea and  vomiting)    severe   Primary osteoarthritis of left knee    Severe Patellofemoral arthritis   Rhomboid pain    Right and right trapezius with concomitant cervical spondylosis   Smoking history 01/15/2015   Thyrotoxicosis 10/22/2008   Qualifier: Diagnosis of   By: Alwyn Ren MD, William         Tinnitus 06/27/2019   UNSPECIFIED ANEMIA 11/02/2008   Qualifier: Diagnosis of   By: Alwyn Ren MD, Chrissie Noa         Wears contact lenses     Past Surgical History:  Procedure Laterality Date   BREAST LUMPECTOMY WITH RADIOACTIVE SEED AND SENTINEL  LYMPH NODE BIOPSY Left 11/02/2017   Procedure: LEFT BREAST LUMPECTOMY WITH RADIOACTIVE SEED AND LEFT AXILLARY DEEP SENTINEL LYMPH NODE BIOPSY, INJECT Thornton DYE LEFT BREAST;  Surgeon: Claud Kelp, MD;  Location: [facility];  Service: General;  Laterality: Left;   BREAST SURGERY  2000   Breast cyst removed, left   LAPAROSCOPIC BILATERAL SALPINGO OOPHERECTOMY Bilateral 10/13/2019   Procedure: LAPAROSCOPIC BILATERAL SALPINGO OOPHORECTOMY LYSIS OF ADHESIONS PERITONEAL WASHINGS ;  Surgeon: Genia Del, MD;  Location: [facility];  Service: Gynecology;  Laterality: Bilateral;  request 7:30am OR time in [place] [facility] block requests one hour   TONSILLECTOMY  age 70   TUBAL LIGATION Bilateral yrs ago   VAGINAL HYSTERECTOMY  1991   Right ovarian cystectomy   VIDEO BRONCHOSCOPY Bilateral 03/16/2015   Procedure: VIDEO BRONCHOSCOPY WITHOUT FLUORO;  Surgeon: Kalman Shan, MD;  Location: [facility];  Service: Endoscopy;  Laterality: Bilateral;    Current Medications: Current Meds  Medication Sig   B Complex-C (B-COMPLEX WITH VITAMIN C) tablet Take 1 tablet by mouth daily.   cetirizine (ZYRTEC) 10 MG tablet Take 10 mg by mouth daily.   Cholecalciferol (VITAMIN D) 2000 units CAPS Take 2,000 Units by mouth daily.   clorazepate (TRANXENE) 7.5 MG tablet Take 1 tablet (7.5 mg total) by mouth at bedtime as needed for anxiety.    CRANBERRY PO Take 2 tablets by mouth in the morning and at bedtime.   dicyclomine (BENTYL) 20 MG tablet TAKE 1 TABLET (20 MG TOTAL) BY MOUTH 3 (THREE) TIMES DAILY AS NEEDED FOR SPASMS.   famotidine (PEPCID) 20 MG tablet TAKE 1 TABLET BY MOUTH EVERY DAY   methenamine (HIPREX) 1 g tablet Take 1 tablet (1 g total) by mouth 2 (two) times daily with a meal. Take with 500mg  of vitamin c w/ each tablet   methocarbamol (ROBAXIN) 500 MG tablet Take 500 mg by mouth every 8 (eight) hours as needed for muscle spasms.   nitrofurantoin, macrocrystal-monohydrate, (MACROBID) 100 MG capsule Take 1 capsule (100 mg total) by mouth daily.   omeprazole (PRILOSEC) 40 MG capsule TAKE 1 CAPSULE (40 MG TOTAL) BY MOUTH DAILY.   ondansetron (ZOFRAN-ODT) 4 MG disintegrating tablet TAKE 1 TABLET BY MOUTH EVERY 8 HOURS AS NEEDED FOR NAUSEA OR VOMITING   Probiotic Product (PROBIOTIC PO) Take 1 tablet by mouth daily.   valACYclovir (VALTREX) 500 MG tablet TAKE 1 TABLET (500 MG TOTAL) BY MOUTH DAILY. 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     Allergies:   Doxycycline, Other, Oxycodone, Sulfa antibiotics, Trimethoprim sulfate [trimethoprim], Codeine, and Propranolol hcl   Social History   Socioeconomic History   Marital status: Single    Spouse name: Not on file   Number of children: 2   Years of education: Not on file   Highest education level: Master's degree (e.g., MA, MS, MEng, MEd, MSW, MBA)  Occupational History   Occupation: Runner, broadcasting/film/video, works part time     Associate Professor: [employer]  Tobacco Use   Smoking status: Former    Current packs/day: 0.00    Average packs/day: 0.1 packs/day for 30.0 years (3.0 ttl pk-yrs)    Types: Cigarettes    Start date: 10/30/1965    Quit date: 10/31/1995    Years since quitting: 28.0   Smokeless tobacco: Never  Vaping Use   Vaping status: Never Used  Substance and Sexual Activity   Alcohol use: Yes    Comment: occ   Drug use: No   Sexual activity: Not  Currently    Birth control/protection:  Surgical    Comment: 1st intercourse 78 yo-Fewer than 5 partners, hysterectomt  Other Topics Concern   Not on file  Social History Narrative   Lives by herself   2 children, one has mental issues    Social Drivers of Corporate investment banker Strain: Low Risk  (06/26/2022)   Overall Financial Resource Strain (CARDIA)    Difficulty of Paying Living Expenses: Not hard at all  Food Insecurity: No Food Insecurity (04/05/2023)   Hunger Vital Sign    Worried About Running Out of Food in the Last Year: Never true    Ran Out of Food in the Last Year: Never true  Transportation Needs: No Transportation Needs (04/05/2023)   PRAPARE - Administrator, Civil Service (Medical): No    Lack of Transportation (Non-Medical): No  Physical Activity: Unknown (04/05/2023)   Exercise Vital Sign    Days of Exercise per Week: 0 days    Minutes of Exercise per Session: Not on file  Recent Concern: Physical Activity - Inactive (04/05/2023)   Exercise Vital Sign    Days of Exercise per Week: 0 days    Minutes of Exercise per Session: 0 min  Stress: No Stress Concern Present (04/05/2023)   [employer] of Occupational Health - Occupational Stress Questionnaire    Feeling of Stress : Not at all  Social Connections: Unknown (04/05/2023)   Social Connection and Isolation Panel [NHANES]    Frequency of Communication with Friends and Family: Once a week    Frequency of Social Gatherings with Friends and Family: Once a week    Attends Religious Services: More than 4 times per year    Active Member of [employer] or Organizations: Not on file    Attends Banker Meetings: Not on file    Marital Status: Divorced     Family History: The patient's family history includes Breast cancer in her cousin, maternal aunt, and sister; Colon cancer in her cousin; Colon cancer (age of onset: 10) in her paternal uncle; Colon cancer (age of onset: 57) in her father; Crohn's disease in her son; Diabetes in her  father; Hypertension in her father; Leukemia in her maternal grandfather; Lung cancer in her mother; Osteoporosis in her father and mother; Ovarian cancer (age of onset: 76) in her maternal aunt; Stroke in her mother. ROS:   Please see the history of present illness.    All 14 point review of systems negative except as described per history of present illness.  EKGs/Labs/Other Studies Reviewed:    The following studies were reviewed today:   EKG:  EKG Interpretation Date/Time:  Tuesday November 20 2023 07:52:26 EST Ventricular Rate:  73 PR Interval:  134 QRS Duration:  74 QT Interval:  360 QTC Calculation: 396 R Axis:   87  Text Interpretation: Normal sinus rhythm Normal ECG When compared with ECG of 04-Mar-2016 02:01, PREVIOUS ECG IS PRESENT Confirmed by Huntley Dec reddy [id number]) on 11/20/2023 8:09:04 AM    Recent Labs: 03/23/2023: TSH 2.73 11/12/2023: ALT 19; BUN 21; Creatinine, Ser 0.95; Hemoglobin 12.6; Platelets 273.0; Potassium 4.1; Sodium 134  Recent Lipid Panel    Component Value Date/Time   CHOL 206 (H) 03/23/2023 0842   TRIG 75.0 03/23/2023 0842   HDL 87.40 03/23/2023 0842   CHOLHDL 2 03/23/2023 0842   VLDL 15.0 03/23/2023 0842   LDLCALC 104 (H) 03/23/2023 0842    Physical Exam:    VS:  BP 128/82   Pulse 73   Ht 5' 2.6" (1.59 m)   Wt 145 lb 1.9 oz (65.8 kg)   SpO2 99%   BMI 26.04 kg/m     Wt Readings from Last 3 Encounters:  11/20/23 145 lb 1.9 oz (65.8 kg)  11/13/23 146 lb 9.6 oz (66.5 kg)  11/12/23 146 lb 8 oz (66.5 kg)     GENERAL:  Well nourished, well developed in no acute distress NECK: No JVD; No carotid bruits CARDIAC: RRR, S1 and S2 present, no murmurs, no rubs, no gallops CHEST:  Clear to auscultation without rales, wheezing or rhonchi  Extremities: No pitting pedal edema. Pulses bilaterally symmetric with radial 2+ and dorsalis pedis 2+ NEUROLOGIC:  Alert and oriented x 3  Medication Adjustments/Labs and Tests Ordered: Current  medicines are reviewed at length with the patient today.  Concerns regarding medicines are outlined above.  Orders Placed This Encounter  Procedures   Cardiac Stress Test: Informed Consent Details: Physician/Practitioner Attestation; Transcribe to consent form and obtain patient signature   MYOCARDIAL PERFUSION IMAGING   EKG 12-Lead   No orders of the defined types were placed in this encounter.   Signed, Cecille Amsterdam, MD, MPH, [facility]. 11/20/2023 8:53 AM    [facility]

## 2023-11-20 NOTE — Assessment & Plan Note (Signed)
Last lipid panel available to review from Mar 23, 2023 LDL 104, triglyceride 75, HDL 87, total cholesterol 875.  The 10-year ASCVD risk score (Arnett DK, et al., 2019) is: 16.2%   Values used to calculate the score:     Age: 78 years     Sex: Female     Is Non-Hispanic African American: No     Diabetic: No     Tobacco smoker: No     Systolic Blood Pressure: 128 mmHg     Is BP treated: No     HDL Cholesterol: 87.4 mg/dL     Total Cholesterol: 206 mg/dL  She has made significant changes to her diet and is in the process to having this rechecked through PCPs office. With her overall risk she would benefit from statin therapy and I would recommend discussing further with PCP and if agreeable get started on moderate intensity statin regimen with atorvastatin or Crestor.

## 2023-11-20 NOTE — Assessment & Plan Note (Signed)
She was noted to have breast artery calcifications which can reflect elevated coronary artery disease risk and was referred here for further evaluation. We reviewed the results from her mammogram and heart evaluation done with it.  In the setting we discussed her overall cardiovascular risk factors, in the setting of her age, prior history of smoking, hyperlipidemia and also her relatively asymptomatic state.  But somewhat limited functional status due to her spine issues unable to assess if she is able to meet 4 METS activity.  In the setting we discussed further options for cardiovascular restratification and risk reduction strategies.  Healthy diet and lifestyle was discussed with the setting. Follow-up of lipid levels and initiating statin therapy if indicated also reviewed. Further evaluation for any significant underlying coronary artery disease with cardiac stress test versus alternative imaging modalities reviewed.  Given her upcoming surgery decided to proceed with cardiac stress test with Lexiscan nuclear imaging [cardiac PET stress was discussed but due to scheduling do not expect this to be available within the next month]. Lexiscan nuclear imaging stress test tentatively to be done at [facility].

## 2023-11-20 NOTE — Patient Instructions (Signed)
Medication Instructions:  Your physician recommends that you continue on your current medications as directed. Please refer to the Current Medication list given to you today.  *If you need a refill on your cardiac medications before your next appointment, please call your pharmacy*   Lab Work: None ordered If you have labs (blood work) drawn today and your tests are completely normal, you will receive your results only by: MyChart Message (if you have MyChart) OR A paper copy in the mail If you have any lab test that is abnormal or we need to change your treatment, we will call you to review the results.   Testing/Procedures: You are scheduled for a Myocardial Perfusion Imaging Study.  Please arrive 15 minutes prior to your appointment time for registration and insurance purposes.  The test will take approximately 3 to 4 hours to complete; you may bring reading material.  If someone comes with you to your appointment, they will need to remain in the main lobby due to limited space in the testing area.   How to prepare for your Myocardial Perfusion Test: Do not eat or drink 3 hours prior to your test, except you may have water. Do not consume products containing caffeine (regular or decaffeinated) 12 hours prior to your test. (ex: coffee, chocolate, sodas, tea). Do bring a list of your current medications with you.  If not listed below, you may take your medications as normal.  Do wear comfortable clothes (no dresses or overalls) and walking shoes, tennis shoes preferred (No heels or open toe shoes are allowed). Do NOT wear cologne, perfume, aftershave, or lotions (deodorant is allowed). If these instructions are not followed, your test will have to be rescheduled.  If you cannot keep your appointment, please provide 24 hours notification to the Nuclear Lab, to avoid a possible $50 charge to your account.  Follow-Up: At [facility], you and your health needs are our priority.   As part of our continuing mission to provide you with exceptional heart care, we have created designated Provider Care Teams.  These Care Teams include your primary Cardiologist (physician) and Advanced Practice Providers (APPs -  Physician Assistants and Nurse Practitioners) who all work together to provide you with the care you need, when you need it.  We recommend signing up for the patient portal called "MyChart".  Sign up information is provided on this After Visit Summary.  MyChart is used to connect with patients for Virtual Visits (Telemedicine).  Patients are able to view lab/test results, encounter notes, upcoming appointments, etc.  Non-urgent messages can be sent to your provider as well.   To learn more about what you can do with MyChart, go to [email or web address].    Your next appointment:   Follow up as needed   Other Instructions  Cardiac Nuclear Scan A cardiac nuclear scan is a test that is done to check the flow of blood to your heart. It is done when you are resting and when you are exercising. The test looks for problems such as: Not enough blood reaching a portion of the heart. The heart muscle not working as it should. You may need this test if you have: Heart disease. Lab results that are not normal. Had heart surgery or a balloon procedure to open up blocked arteries (angioplasty) or a small mesh tube (stent). Chest pain. Shortness of breath. Had a heart attack. In this test, a special dye (tracer) is put into your bloodstream. The tracer will travel to  your heart. A camera will then take pictures of your heart to see how the tracer moves through your heart. This test is usually done at a hospital and takes 2-4 hours. Tell a doctor about: Any allergies you have. All medicines you are taking, including vitamins, herbs, eye drops, creams, and over-the-counter medicines. Any bleeding problems you have. Any surgeries you have had. Any medical conditions you  have. Whether you are pregnant or may be pregnant. Any history of asthma or long-term (chronic) lung disease. Any history of heart rhythm disorders or heart valve conditions. What are the risks? Your doctor will talk with you about risks. These may include: Serious chest pain and heart attack. This is only a risk if the stress portion of the test is done. Fast or uneven heartbeats (palpitations). A feeling of warmth in your chest. This feeling usually does not last long. Allergic reaction to the tracer. Shortness of breath or trouble breathing. What happens before the test? Ask your doctor about changing or stopping your normal medicines. Follow instructions from your doctor about what you cannot eat or drink. Remove your jewelry on the day of the test. Ask your doctor if you need to avoid nicotine or caffeine. What happens during the test? An IV tube will be inserted into one of your veins. Your doctor will give you a small amount of tracer through the IV tube. You will wait for 20-40 minutes while the tracer moves through your bloodstream. Your heart will be monitored with an electrocardiogram (ECG). You will lie down on an exam table. Pictures of your heart will be taken for about 15-20 minutes. You may also have a stress test. For this test, one of these things may be done: You will be asked to exercise on a treadmill or a stationary bike. You will be given medicines that will make your heart work harder. This is done if you are unable to exercise. When blood flow to your heart has peaked, a tracer will again be given through the IV tube. After 20-40 minutes, you will get back on the exam table. More pictures will be taken of your heart. Depending on the tracer that is used, more pictures may need to be taken 3-4 hours later. Your IV tube will be removed when the test is over. The test may vary among doctors and hospitals. What happens after the test? Ask your doctor: Whether you  can return to your normal schedule, including diet, activities, travel, and medicines. Whether you should drink more fluids. This will help to remove the tracer from your body. Ask your doctor, or the department that is doing the test: When will my results be ready? How will I get my results? What are my treatment options? What other tests do I need? What are my next steps? This information is not intended to replace advice given to you by your health care provider. Make sure you discuss any questions you have with your health care provider. Document Revised: 03/14/2022 Document Reviewed: 03/14/2022 Elsevier Patient Education  2023 [employer].

## 2023-11-20 NOTE — Assessment & Plan Note (Signed)
Preop cardiovascular exam prior to elective surgery for the spine coming up on February 3 for kyphoscoliosis with Dr.Hey.  No acute cardiac symptoms. Does have cardiovascular risk factors. Functional status unable to determine if she would meet 4 METS given limited mobility and description of activities through the day.  Now in the setting of significant breast artery calcifications noted on mammogram and referred here for further cardiovascular risk assessment discussed further strategy for breast reduction and further restratification.  She is diligent about her diet and lifestyle and has already made changes to the diet further given since her last lipid panel.  Reviewed further restratification with cardiac stress test with nuclear imaging prior to her upcoming surgery in the setting.  She is agreeable we will proceed with scheduling this at [facility] and review the results and if the study is low risk without  significant abnormalities should be okay to proceed with her surgery as being planned.

## 2023-11-21 ENCOUNTER — Encounter (HOSPITAL_COMMUNITY): Payer: Self-pay

## 2023-11-26 DIAGNOSIS — M4004 Postural kyphosis, thoracic region: Secondary | ICD-10-CM | POA: Insufficient documentation

## 2023-11-28 ENCOUNTER — Ambulatory Visit (HOSPITAL_COMMUNITY): Payer: Medicare PPO

## 2023-11-28 DIAGNOSIS — Z01810 Encounter for preprocedural cardiovascular examination: Secondary | ICD-10-CM | POA: Insufficient documentation

## 2023-11-28 LAB — MYOCARDIAL PERFUSION IMAGING
LV dias vol: 53 mL (ref 46–106)
LV sys vol: 11 mL
Nuc Stress EF: 79 %
Peak HR: 104 {beats}/min
Rest HR: 71 {beats}/min
Rest Nuclear Isotope Dose: 11 mCi
SDS: 1
SRS: 0
SSS: 1
ST Depression (mm): 0 mm
Stress Nuclear Isotope Dose: 31 mCi
TID: 1.01

## 2023-11-28 MED ORDER — TECHNETIUM TC 99M TETROFOSMIN IV KIT
31.0000 | PACK | Freq: Once | INTRAVENOUS | Status: AC | PRN
Start: 2023-11-28 — End: 2023-11-28
  Administered 2023-11-28: 31 via INTRAVENOUS

## 2023-11-28 MED ORDER — TECHNETIUM TC 99M TETROFOSMIN IV KIT
11.0000 | PACK | Freq: Once | INTRAVENOUS | Status: AC | PRN
  Administered 2023-11-28: 11 via INTRAVENOUS

## 2023-11-28 MED ORDER — REGADENOSON 0.4 MG/5ML IV SOLN
0.4000 mg | Freq: Once | INTRAVENOUS | Status: AC
  Administered 2023-11-28: 0.4 mg via INTRAVENOUS

## 2023-12-03 DIAGNOSIS — K5939 Other megacolon: Secondary | ICD-10-CM | POA: Diagnosis not present

## 2023-12-03 DIAGNOSIS — K567 Ileus, unspecified: Secondary | ICD-10-CM | POA: Diagnosis not present

## 2023-12-03 DIAGNOSIS — Z741 Need for assistance with personal care: Secondary | ICD-10-CM | POA: Diagnosis not present

## 2023-12-03 DIAGNOSIS — M4324 Fusion of spine, thoracic region: Secondary | ICD-10-CM | POA: Diagnosis not present

## 2023-12-03 DIAGNOSIS — M4125 Other idiopathic scoliosis, thoracolumbar region: Secondary | ICD-10-CM | POA: Diagnosis not present

## 2023-12-03 DIAGNOSIS — Z882 Allergy status to sulfonamides status: Secondary | ICD-10-CM | POA: Diagnosis not present

## 2023-12-03 DIAGNOSIS — M6281 Muscle weakness (generalized): Secondary | ICD-10-CM | POA: Diagnosis not present

## 2023-12-03 DIAGNOSIS — J302 Other seasonal allergic rhinitis: Secondary | ICD-10-CM | POA: Diagnosis not present

## 2023-12-03 DIAGNOSIS — K219 Gastro-esophageal reflux disease without esophagitis: Secondary | ICD-10-CM | POA: Diagnosis not present

## 2023-12-03 DIAGNOSIS — Z87440 Personal history of urinary (tract) infections: Secondary | ICD-10-CM | POA: Diagnosis not present

## 2023-12-03 DIAGNOSIS — R52 Pain, unspecified: Secondary | ICD-10-CM | POA: Diagnosis not present

## 2023-12-03 DIAGNOSIS — K9189 Other postprocedural complications and disorders of digestive system: Secondary | ICD-10-CM | POA: Diagnosis not present

## 2023-12-03 DIAGNOSIS — N390 Urinary tract infection, site not specified: Secondary | ICD-10-CM | POA: Diagnosis not present

## 2023-12-03 DIAGNOSIS — R262 Difficulty in walking, not elsewhere classified: Secondary | ICD-10-CM | POA: Diagnosis not present

## 2023-12-03 DIAGNOSIS — R279 Unspecified lack of coordination: Secondary | ICD-10-CM | POA: Diagnosis not present

## 2023-12-03 DIAGNOSIS — M4327 Fusion of spine, lumbosacral region: Secondary | ICD-10-CM | POA: Diagnosis not present

## 2023-12-03 DIAGNOSIS — B009 Herpesviral infection, unspecified: Secondary | ICD-10-CM | POA: Diagnosis not present

## 2023-12-03 DIAGNOSIS — M4004 Postural kyphosis, thoracic region: Secondary | ICD-10-CM | POA: Diagnosis not present

## 2023-12-03 DIAGNOSIS — R5381 Other malaise: Secondary | ICD-10-CM | POA: Diagnosis not present

## 2023-12-03 DIAGNOSIS — M1990 Unspecified osteoarthritis, unspecified site: Secondary | ICD-10-CM | POA: Diagnosis not present

## 2023-12-03 DIAGNOSIS — F32A Depression, unspecified: Secondary | ICD-10-CM | POA: Diagnosis not present

## 2023-12-03 DIAGNOSIS — K6389 Other specified diseases of intestine: Secondary | ICD-10-CM | POA: Diagnosis not present

## 2023-12-03 DIAGNOSIS — D62 Acute posthemorrhagic anemia: Secondary | ICD-10-CM | POA: Diagnosis not present

## 2023-12-03 HISTORY — PX: SPINAL FUSION: SHX223

## 2023-12-06 DIAGNOSIS — K9189 Other postprocedural complications and disorders of digestive system: Secondary | ICD-10-CM | POA: Diagnosis not present

## 2023-12-06 DIAGNOSIS — K5939 Other megacolon: Secondary | ICD-10-CM | POA: Diagnosis not present

## 2023-12-06 DIAGNOSIS — K6389 Other specified diseases of intestine: Secondary | ICD-10-CM | POA: Diagnosis not present

## 2023-12-09 DIAGNOSIS — K6389 Other specified diseases of intestine: Secondary | ICD-10-CM | POA: Diagnosis not present

## 2023-12-10 DIAGNOSIS — M4125 Other idiopathic scoliosis, thoracolumbar region: Secondary | ICD-10-CM | POA: Diagnosis not present

## 2023-12-10 DIAGNOSIS — J479 Bronchiectasis, uncomplicated: Secondary | ICD-10-CM | POA: Diagnosis not present

## 2023-12-10 DIAGNOSIS — R5381 Other malaise: Secondary | ICD-10-CM | POA: Diagnosis not present

## 2023-12-10 DIAGNOSIS — E8809 Other disorders of plasma-protein metabolism, not elsewhere classified: Secondary | ICD-10-CM | POA: Diagnosis not present

## 2023-12-10 DIAGNOSIS — E871 Hypo-osmolality and hyponatremia: Secondary | ICD-10-CM | POA: Diagnosis not present

## 2023-12-10 DIAGNOSIS — K567 Ileus, unspecified: Secondary | ICD-10-CM | POA: Diagnosis not present

## 2023-12-10 DIAGNOSIS — B009 Herpesviral infection, unspecified: Secondary | ICD-10-CM | POA: Diagnosis not present

## 2023-12-10 DIAGNOSIS — E039 Hypothyroidism, unspecified: Secondary | ICD-10-CM | POA: Diagnosis not present

## 2023-12-10 DIAGNOSIS — Z741 Need for assistance with personal care: Secondary | ICD-10-CM | POA: Diagnosis not present

## 2023-12-10 DIAGNOSIS — R52 Pain, unspecified: Secondary | ICD-10-CM | POA: Diagnosis not present

## 2023-12-10 DIAGNOSIS — M4327 Fusion of spine, lumbosacral region: Secondary | ICD-10-CM | POA: Diagnosis not present

## 2023-12-10 DIAGNOSIS — K7689 Other specified diseases of liver: Secondary | ICD-10-CM | POA: Diagnosis not present

## 2023-12-10 DIAGNOSIS — M4004 Postural kyphosis, thoracic region: Secondary | ICD-10-CM | POA: Diagnosis not present

## 2023-12-10 DIAGNOSIS — N390 Urinary tract infection, site not specified: Secondary | ICD-10-CM | POA: Diagnosis not present

## 2023-12-10 DIAGNOSIS — M792 Neuralgia and neuritis, unspecified: Secondary | ICD-10-CM | POA: Diagnosis not present

## 2023-12-10 DIAGNOSIS — K219 Gastro-esophageal reflux disease without esophagitis: Secondary | ICD-10-CM | POA: Diagnosis not present

## 2023-12-10 DIAGNOSIS — R7989 Other specified abnormal findings of blood chemistry: Secondary | ICD-10-CM | POA: Diagnosis not present

## 2023-12-10 DIAGNOSIS — R279 Unspecified lack of coordination: Secondary | ICD-10-CM | POA: Diagnosis not present

## 2023-12-10 DIAGNOSIS — R338 Other retention of urine: Secondary | ICD-10-CM | POA: Diagnosis not present

## 2023-12-10 DIAGNOSIS — D649 Anemia, unspecified: Secondary | ICD-10-CM | POA: Diagnosis not present

## 2023-12-10 DIAGNOSIS — K5981 Ogilvie syndrome: Secondary | ICD-10-CM | POA: Diagnosis not present

## 2023-12-10 DIAGNOSIS — M62838 Other muscle spasm: Secondary | ICD-10-CM | POA: Diagnosis not present

## 2023-12-10 DIAGNOSIS — R262 Difficulty in walking, not elsewhere classified: Secondary | ICD-10-CM | POA: Diagnosis not present

## 2023-12-10 DIAGNOSIS — F5104 Psychophysiologic insomnia: Secondary | ICD-10-CM | POA: Diagnosis not present

## 2023-12-10 DIAGNOSIS — K5900 Constipation, unspecified: Secondary | ICD-10-CM | POA: Diagnosis not present

## 2023-12-10 DIAGNOSIS — R1084 Generalized abdominal pain: Secondary | ICD-10-CM | POA: Diagnosis not present

## 2023-12-10 DIAGNOSIS — M1990 Unspecified osteoarthritis, unspecified site: Secondary | ICD-10-CM | POA: Diagnosis not present

## 2023-12-10 DIAGNOSIS — B961 Klebsiella pneumoniae [K. pneumoniae] as the cause of diseases classified elsewhere: Secondary | ICD-10-CM | POA: Diagnosis not present

## 2023-12-10 DIAGNOSIS — M6281 Muscle weakness (generalized): Secondary | ICD-10-CM | POA: Diagnosis not present

## 2023-12-11 DIAGNOSIS — N390 Urinary tract infection, site not specified: Secondary | ICD-10-CM | POA: Diagnosis not present

## 2023-12-11 DIAGNOSIS — K31A0 Gastric intestinal metaplasia, unspecified: Secondary | ICD-10-CM | POA: Diagnosis not present

## 2023-12-11 DIAGNOSIS — M792 Neuralgia and neuritis, unspecified: Secondary | ICD-10-CM | POA: Diagnosis not present

## 2023-12-11 DIAGNOSIS — C50011 Malignant neoplasm of nipple and areola, right female breast: Secondary | ICD-10-CM | POA: Diagnosis not present

## 2023-12-11 DIAGNOSIS — R338 Other retention of urine: Secondary | ICD-10-CM | POA: Diagnosis not present

## 2023-12-11 DIAGNOSIS — K2950 Unspecified chronic gastritis without bleeding: Secondary | ICD-10-CM | POA: Diagnosis not present

## 2023-12-11 DIAGNOSIS — R1084 Generalized abdominal pain: Secondary | ICD-10-CM | POA: Diagnosis not present

## 2023-12-11 DIAGNOSIS — M6281 Muscle weakness (generalized): Secondary | ICD-10-CM | POA: Diagnosis not present

## 2023-12-11 DIAGNOSIS — E8809 Other disorders of plasma-protein metabolism, not elsewhere classified: Secondary | ICD-10-CM | POA: Diagnosis not present

## 2023-12-11 DIAGNOSIS — J479 Bronchiectasis, uncomplicated: Secondary | ICD-10-CM | POA: Diagnosis not present

## 2023-12-11 DIAGNOSIS — R7989 Other specified abnormal findings of blood chemistry: Secondary | ICD-10-CM | POA: Diagnosis not present

## 2023-12-11 DIAGNOSIS — Z741 Need for assistance with personal care: Secondary | ICD-10-CM | POA: Diagnosis not present

## 2023-12-11 DIAGNOSIS — M419 Scoliosis, unspecified: Secondary | ICD-10-CM | POA: Diagnosis not present

## 2023-12-11 DIAGNOSIS — J9811 Atelectasis: Secondary | ICD-10-CM | POA: Diagnosis not present

## 2023-12-11 DIAGNOSIS — E039 Hypothyroidism, unspecified: Secondary | ICD-10-CM | POA: Diagnosis not present

## 2023-12-11 DIAGNOSIS — M4320 Fusion of spine, site unspecified: Secondary | ICD-10-CM | POA: Diagnosis not present

## 2023-12-11 DIAGNOSIS — M4004 Postural kyphosis, thoracic region: Secondary | ICD-10-CM | POA: Diagnosis not present

## 2023-12-11 DIAGNOSIS — D649 Anemia, unspecified: Secondary | ICD-10-CM | POA: Diagnosis not present

## 2023-12-11 DIAGNOSIS — R339 Retention of urine, unspecified: Secondary | ICD-10-CM | POA: Diagnosis not present

## 2023-12-11 DIAGNOSIS — K5900 Constipation, unspecified: Secondary | ICD-10-CM | POA: Diagnosis not present

## 2023-12-11 DIAGNOSIS — K6389 Other specified diseases of intestine: Secondary | ICD-10-CM | POA: Diagnosis not present

## 2023-12-11 DIAGNOSIS — R140 Abdominal distension (gaseous): Secondary | ICD-10-CM | POA: Diagnosis not present

## 2023-12-11 DIAGNOSIS — K5981 Ogilvie syndrome: Secondary | ICD-10-CM | POA: Diagnosis not present

## 2023-12-11 DIAGNOSIS — M8580 Other specified disorders of bone density and structure, unspecified site: Secondary | ICD-10-CM | POA: Diagnosis not present

## 2023-12-11 DIAGNOSIS — Z171 Estrogen receptor negative status [ER-]: Secondary | ICD-10-CM | POA: Diagnosis not present

## 2023-12-11 DIAGNOSIS — R12 Heartburn: Secondary | ICD-10-CM | POA: Diagnosis not present

## 2023-12-11 DIAGNOSIS — R918 Other nonspecific abnormal finding of lung field: Secondary | ICD-10-CM | POA: Diagnosis not present

## 2023-12-11 DIAGNOSIS — C50919 Malignant neoplasm of unspecified site of unspecified female breast: Secondary | ICD-10-CM | POA: Diagnosis not present

## 2023-12-11 DIAGNOSIS — I3139 Other pericardial effusion (noninflammatory): Secondary | ICD-10-CM | POA: Diagnosis not present

## 2023-12-11 DIAGNOSIS — B009 Herpesviral infection, unspecified: Secondary | ICD-10-CM | POA: Diagnosis not present

## 2023-12-11 DIAGNOSIS — R079 Chest pain, unspecified: Secondary | ICD-10-CM | POA: Diagnosis not present

## 2023-12-11 DIAGNOSIS — Z4789 Encounter for other orthopedic aftercare: Secondary | ICD-10-CM | POA: Diagnosis not present

## 2023-12-11 DIAGNOSIS — K567 Ileus, unspecified: Secondary | ICD-10-CM | POA: Diagnosis not present

## 2023-12-11 DIAGNOSIS — K769 Liver disease, unspecified: Secondary | ICD-10-CM | POA: Diagnosis not present

## 2023-12-11 DIAGNOSIS — K219 Gastro-esophageal reflux disease without esophagitis: Secondary | ICD-10-CM | POA: Diagnosis not present

## 2023-12-11 DIAGNOSIS — C50012 Malignant neoplasm of nipple and areola, left female breast: Secondary | ICD-10-CM | POA: Diagnosis not present

## 2023-12-11 DIAGNOSIS — B961 Klebsiella pneumoniae [K. pneumoniae] as the cause of diseases classified elsewhere: Secondary | ICD-10-CM | POA: Diagnosis not present

## 2023-12-11 DIAGNOSIS — K31A11 Gastric intestinal metaplasia without dysplasia, involving the antrum: Secondary | ICD-10-CM | POA: Diagnosis not present

## 2023-12-11 DIAGNOSIS — K7689 Other specified diseases of liver: Secondary | ICD-10-CM | POA: Diagnosis not present

## 2023-12-11 DIAGNOSIS — R2689 Other abnormalities of gait and mobility: Secondary | ICD-10-CM | POA: Diagnosis not present

## 2023-12-11 DIAGNOSIS — E871 Hypo-osmolality and hyponatremia: Secondary | ICD-10-CM | POA: Diagnosis not present

## 2023-12-11 DIAGNOSIS — R112 Nausea with vomiting, unspecified: Secondary | ICD-10-CM | POA: Diagnosis not present

## 2023-12-11 DIAGNOSIS — M62838 Other muscle spasm: Secondary | ICD-10-CM | POA: Diagnosis not present

## 2023-12-11 DIAGNOSIS — J471 Bronchiectasis with (acute) exacerbation: Secondary | ICD-10-CM | POA: Diagnosis not present

## 2023-12-11 DIAGNOSIS — M4854XA Collapsed vertebra, not elsewhere classified, thoracic region, initial encounter for fracture: Secondary | ICD-10-CM | POA: Diagnosis not present

## 2023-12-11 DIAGNOSIS — F5104 Psychophysiologic insomnia: Secondary | ICD-10-CM | POA: Diagnosis not present

## 2023-12-11 DIAGNOSIS — Z981 Arthrodesis status: Secondary | ICD-10-CM | POA: Diagnosis not present

## 2023-12-12 DIAGNOSIS — M8580 Other specified disorders of bone density and structure, unspecified site: Secondary | ICD-10-CM | POA: Diagnosis not present

## 2023-12-12 DIAGNOSIS — C50011 Malignant neoplasm of nipple and areola, right female breast: Secondary | ICD-10-CM | POA: Diagnosis not present

## 2023-12-12 DIAGNOSIS — K5981 Ogilvie syndrome: Secondary | ICD-10-CM | POA: Diagnosis not present

## 2023-12-12 DIAGNOSIS — R339 Retention of urine, unspecified: Secondary | ICD-10-CM | POA: Diagnosis not present

## 2023-12-12 DIAGNOSIS — K5900 Constipation, unspecified: Secondary | ICD-10-CM | POA: Insufficient documentation

## 2023-12-12 DIAGNOSIS — C50012 Malignant neoplasm of nipple and areola, left female breast: Secondary | ICD-10-CM | POA: Diagnosis not present

## 2023-12-12 DIAGNOSIS — R1084 Generalized abdominal pain: Secondary | ICD-10-CM | POA: Diagnosis not present

## 2023-12-12 DIAGNOSIS — J471 Bronchiectasis with (acute) exacerbation: Secondary | ICD-10-CM | POA: Diagnosis not present

## 2023-12-12 DIAGNOSIS — Z171 Estrogen receptor negative status [ER-]: Secondary | ICD-10-CM | POA: Diagnosis not present

## 2023-12-12 DIAGNOSIS — E871 Hypo-osmolality and hyponatremia: Secondary | ICD-10-CM | POA: Diagnosis not present

## 2023-12-13 DIAGNOSIS — K567 Ileus, unspecified: Secondary | ICD-10-CM | POA: Diagnosis not present

## 2023-12-13 DIAGNOSIS — K5981 Ogilvie syndrome: Secondary | ICD-10-CM | POA: Diagnosis not present

## 2023-12-13 DIAGNOSIS — R140 Abdominal distension (gaseous): Secondary | ICD-10-CM | POA: Diagnosis not present

## 2023-12-13 DIAGNOSIS — R1084 Generalized abdominal pain: Secondary | ICD-10-CM | POA: Diagnosis not present

## 2023-12-14 DIAGNOSIS — E871 Hypo-osmolality and hyponatremia: Secondary | ICD-10-CM | POA: Diagnosis not present

## 2023-12-18 DIAGNOSIS — K2960 Other gastritis without bleeding: Secondary | ICD-10-CM | POA: Insufficient documentation

## 2023-12-21 DIAGNOSIS — R12 Heartburn: Secondary | ICD-10-CM | POA: Insufficient documentation

## 2023-12-27 DIAGNOSIS — G4700 Insomnia, unspecified: Secondary | ICD-10-CM | POA: Diagnosis not present

## 2023-12-27 DIAGNOSIS — S82001A Unspecified fracture of right patella, initial encounter for closed fracture: Secondary | ICD-10-CM | POA: Diagnosis not present

## 2023-12-27 DIAGNOSIS — Z981 Arthrodesis status: Secondary | ICD-10-CM | POA: Diagnosis not present

## 2023-12-27 DIAGNOSIS — R2689 Other abnormalities of gait and mobility: Secondary | ICD-10-CM | POA: Diagnosis not present

## 2023-12-27 DIAGNOSIS — M79651 Pain in right thigh: Secondary | ICD-10-CM | POA: Diagnosis not present

## 2023-12-27 DIAGNOSIS — M6281 Muscle weakness (generalized): Secondary | ICD-10-CM | POA: Diagnosis not present

## 2023-12-27 DIAGNOSIS — M4320 Fusion of spine, site unspecified: Secondary | ICD-10-CM | POA: Diagnosis not present

## 2023-12-27 DIAGNOSIS — M4004 Postural kyphosis, thoracic region: Secondary | ICD-10-CM | POA: Diagnosis not present

## 2023-12-27 DIAGNOSIS — M25551 Pain in right hip: Secondary | ICD-10-CM | POA: Diagnosis not present

## 2023-12-27 DIAGNOSIS — K5981 Ogilvie syndrome: Secondary | ICD-10-CM | POA: Diagnosis not present

## 2023-12-27 DIAGNOSIS — K219 Gastro-esophageal reflux disease without esophagitis: Secondary | ICD-10-CM | POA: Diagnosis not present

## 2023-12-27 DIAGNOSIS — M4854XA Collapsed vertebra, not elsewhere classified, thoracic region, initial encounter for fracture: Secondary | ICD-10-CM | POA: Diagnosis not present

## 2023-12-27 DIAGNOSIS — M25561 Pain in right knee: Secondary | ICD-10-CM | POA: Diagnosis not present

## 2023-12-27 DIAGNOSIS — J479 Bronchiectasis, uncomplicated: Secondary | ICD-10-CM | POA: Diagnosis not present

## 2023-12-27 DIAGNOSIS — M419 Scoliosis, unspecified: Secondary | ICD-10-CM | POA: Diagnosis not present

## 2023-12-27 DIAGNOSIS — Z741 Need for assistance with personal care: Secondary | ICD-10-CM | POA: Diagnosis not present

## 2023-12-27 DIAGNOSIS — E871 Hypo-osmolality and hyponatremia: Secondary | ICD-10-CM | POA: Diagnosis not present

## 2023-12-27 DIAGNOSIS — F419 Anxiety disorder, unspecified: Secondary | ICD-10-CM | POA: Diagnosis not present

## 2023-12-27 DIAGNOSIS — Z4789 Encounter for other orthopedic aftercare: Secondary | ICD-10-CM | POA: Diagnosis not present

## 2023-12-28 ENCOUNTER — Non-Acute Institutional Stay (SKILLED_NURSING_FACILITY): Payer: Self-pay | Admitting: Student

## 2023-12-28 ENCOUNTER — Encounter: Payer: Self-pay | Admitting: Student

## 2023-12-28 DIAGNOSIS — G4700 Insomnia, unspecified: Secondary | ICD-10-CM

## 2023-12-28 DIAGNOSIS — F419 Anxiety disorder, unspecified: Secondary | ICD-10-CM

## 2023-12-28 DIAGNOSIS — K219 Gastro-esophageal reflux disease without esophagitis: Secondary | ICD-10-CM

## 2023-12-28 DIAGNOSIS — J479 Bronchiectasis, uncomplicated: Secondary | ICD-10-CM | POA: Diagnosis not present

## 2023-12-28 DIAGNOSIS — Z981 Arthrodesis status: Secondary | ICD-10-CM

## 2023-12-28 DIAGNOSIS — M4004 Postural kyphosis, thoracic region: Secondary | ICD-10-CM

## 2023-12-28 MED ORDER — CLORAZEPATE DIPOTASSIUM 7.5 MG PO TABS: 7.5000 mg | ORAL_TABLET | Freq: Every evening | ORAL | 1 refills | Status: DC | PRN

## 2023-12-28 NOTE — Progress Notes (Signed)
 Provider:  Dr. Earnestine Mealing Location:  Other [place].  Nursing Home Room Number: [facility] 108A Place of Service:  SNF (31)  PCP: Wanda Plump, MD Patient Care Team: Wanda Plump, MD as PCP - Lanny Cramp, MD as Consulting Physician (Orthopedic Surgery) Serena Croissant, MD as Consulting Physician (Hematology and Oncology) Claud Kelp, MD as Consulting Physician (General Surgery) Kalman Shan, MD as Consulting Physician (Pulmonary Disease) Dorothy Puffer, MD as Consulting Physician (Radiation Oncology) Axel Filler, Larna Daughters, NP as Nurse Practitioner (Hematology and Oncology) Crist Fat, MD as Attending Physician (Urology) Christia Reading, MD as Consulting Physician (Otolaryngology) Sherrilyn Rist. (Inactive) (Cardiology) Genia Del, MD as Consulting Physician (Obstetrics and Gynecology)  Extended Emergency Contact Information Primary Emergency Contact: Gaugh,Eric & [facility] Address: [address]          [place], [place] [address] D[place] of [place] Home Phone: [phone] Mobile Phone: [phone] Relation: Son Secondary Emergency Contact: Sponsel,Elizabeth Mobile Phone: [phone] Relation: Daughter  Code Status: Full Code Goals of Care: Advanced Directive information    10/02/2022    8:05 PM  Advanced Directives  Does Patient Have a Medical Advance Directive? No      Chief Complaint  Patient presents with   New Admit To SNF    Admission.     HPI: Patient is a 78 y.o. female seen today for admission to [facility].  History of Present Illness The patient presents with complications following spine surgery. She is accompanied by her daughter-in-law, Helmut Muster, and her daughter, Lanora Manis.  She underwent a planned spine surgery, which was a large and invasive procedure. Postoperatively, she experienced confusion, likely due to anesthesia and the hospital environment. After discharge, she was readmitted due to ileus and spent 18  hours in the ER at [facility].  During her second hospital stay, she was diagnosed with a urinary tract infection and treated with Ros[place]. She urinates regularly now without further issues. She is currently at a rehabilitation facility, [facility], and reports having bowel movements, with a small one yesterday and another this morning. She has a history of loose bowels due to her vegan diet, which includes high fiber foods like kale and beans. No constipation prior to surgery.  She experiences ongoing nausea, which she attributes to medication sensitivity. She has a history of being sensitive to medications, noting that 'meds and I just don't get along.' She is cautious about taking new medications and prefers to manage her symptoms with familiar treatments.  Her current medications include Tylenol 1000 mg as needed every eight hours, clorazepate as needed, Culturale daily, famotidine at night, Prilosec in the morning, and gabapentin as needed. She also takes methenamine twice a day with vitamin C for UTI prophylaxis and Macrobid chronically for UTI prevention. She has discontinued duloxetine and valacyclovir.  She lives alone but has hired a Advertising copywriter to assist with household tasks post-surgery. She is not currently driving but plans to resume once she feels comfortable. She manages her medications independently.   Past Medical History:  Diagnosis Date   Annual physical exam 07/27/2015   Anxiety 03/29/2019   Arthritis    Breast cancer, left [facility]) oncologist-- dr Pamelia Hoit   dx 09-26-2017--- IDC, Stage IA, Grade 2, ER/PR +;  HER2 negative;  s/p  breast lumpectomy w/ node dissection 11-02-2017;  completed radiation 01-09-2018 (left breast genetic screening panel 2017 with variant of unknown significance AXINA)   Bronchiectasis (HCC)    pulmologist-- dr Sandria Manly-- w/ chronic lingular scarring  (  last exceratbation bronchiectasis 07/ 2020)   Change in bowel habits 09/10/2014   COLONIC  POLYPS, BENIGN 01/16/2008   Qualifier: Diagnosis of   By: Misty Stanley CMA (AAMA), Amanda         DEGENERATIVE JOINT DISEASE 01/16/2008   Qualifier: Diagnosis of   By: Misty Stanley CMA (AAMA), Marchelle Folks      Replacing diagnoses that were inactivated after the 01/29/23 regulatory import     Dyslipidemia    followed by cardiology/ vascular-- dr g. adams ([facility] in [place])  family history strokes   Eczema    ESOPHAGEAL STRICTURE 04/05/2007   Annotation: per EGD  Qualifier: Diagnosis of   By: Drue Novel MD, Nolon Rod.        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  The Comprehensive Cancer Panel offered by [facility] includes sequencing and/or deletion duplication testing of the following 32 genes: APC, ATM, AXIN2, BARD1, BMPR1A, BRCA1, BRCA2, BRIP1, CDH1, CDK4, CDKN2A, CHEK2, EPCAM, FANCC, MLH1, MSH2, MSH6, MUTYH, NBN, PALB2, PMS2, POLD1, POLE, PTEN, RAD51C, RAD51D, SCG5/GREM1, SMAD4, STK1   GERD 07/30/2006   Qualifier: Diagnosis of   By: Drue Novel MD, Theora Master, MULTINODULAR 11/27/2008   Qualifier: Diagnosis of   By: Everardo All MD, Sean A        HIATAL HERNIA 08/15/2006   Qualifier: Diagnosis of   By: Misty Stanley CMA (AAMA), Amanda      Replacing diagnoses that were inactivated after the 01/29/23 regulatory import     History of esophageal stricture    s/p dilatation 2008   History of external beam radiation therapy    left breast 12-19-2017  to 01-09-2018   History of hyperthyroidism    endocrinologist-- (lov 04-11-2019 epic) dr Everardo All, dx 2009 due to multinodular goiter ,  had taken medication for few months 2010 stopped due to norma TFT;     Hypercholesteremia 10/03/2010   Overview:   Chronic [Hypercholesterolemia]     Insomnia    Irritation of left eye 02/07/2022   Malignant neoplasm of lower-inner quadrant of left breast in female, estrogen receptor positive  (HCC) 10/02/2017   Osteopenia 02/2018   T score -2.4 distal third of radius.  FRAX 11% / 1.8% stable at other points of interest to include spine, right and left hip   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   Smoking history 01/15/2015   Thyrotoxicosis 10/22/2008   Qualifier: Diagnosis of   By: Alwyn Ren MD, William         Tinnitus 06/27/2019   UNSPECIFIED ANEMIA 11/02/2008   Qualifier: Diagnosis of   By: Alwyn Ren MD, Chrissie Noa         Wears contact lenses    Past Surgical History:  Procedure Laterality Date   BREAST LUMPECTOMY WITH RADIOACTIVE SEED AND SENTINEL LYMPH NODE BIOPSY Left 11/02/2017   Procedure: LEFT BREAST LUMPECTOMY WITH RADIOACTIVE SEED AND LEFT AXILLARY DEEP SENTINEL LYMPH NODE BIOPSY, INJECT BLUE DYE LEFT BREAST;  Surgeon: Claud Kelp, MD;  Location: [facility];  Service: General;  Laterality: Left;   BREAST SURGERY  2000   Breast cyst removed, left   LAPAROSCOPIC  BILATERAL SALPINGO OOPHERECTOMY Bilateral 10/13/2019   Procedure: LAPAROSCOPIC BILATERAL SALPINGO OOPHORECTOMY LYSIS OF ADHESIONS PERITONEAL WASHINGS ;  Surgeon: Genia Del, MD;  Location: [facility];  Service: Gynecology;  Laterality: Bilateral;  request 7:30am OR time in [place] [facility] block requests one hour   TONSILLECTOMY  age 23   TUBAL LIGATION Bilateral yrs ago   VAGINAL HYSTERECTOMY  1991   Right ovarian cystectomy   VIDEO BRONCHOSCOPY Bilateral 03/16/2015   Procedure: VIDEO BRONCHOSCOPY WITHOUT FLUORO;  Surgeon: Kalman Shan, MD;  Location: [facility];  Service: Endoscopy;  Laterality: Bilateral;    reports that she quit smoking about 28 years ago. Her smoking use included cigarettes. She started smoking about 58 years ago. She has a 3 pack-year smoking history. She has  never used smokeless tobacco. She reports current alcohol use. She reports that she does not use drugs. Social History   Socioeconomic History   Marital status: Single    Spouse name: Not on file   Number of children: 2   Years of education: Not on file   Highest education level: Master's degree (e.g., MA, MS, MEng, MEd, MSW, MBA)  Occupational History   Occupation: Runner, broadcasting/film/video, works part time     Associate Professor: [employer]  Tobacco Use   Smoking status: Former    Current packs/day: 0.00    Average packs/day: 0.1 packs/day for 30.0 years (3.0 ttl pk-yrs)    Types: Cigarettes    Start date: 10/30/1965    Quit date: 10/31/1995    Years since quitting: 28.1   Smokeless tobacco: Never  Vaping Use   Vaping status: Never Used  Substance and Sexual Activity   Alcohol use: Yes    Comment: occ   Drug use: No   Sexual activity: Not Currently    Birth control/protection: Surgical    Comment: 1st intercourse 78 yo-Fewer than 5 partners, hysterectomt  Other Topics Concern   Not on file  Social History Narrative   Lives by herself   2 children, one has mental issues    Social Drivers of Corporate investment banker Strain: Low Risk  (12/12/2023)   [facility]   Overall Financial Resource Strain (CARDIA)    Difficulty of Paying Living Expenses: Not hard at all  Food Insecurity: No Food Insecurity (12/12/2023)   Received from [facility]   Hunger Vital Sign    Worried About Running Out of Food in the Last Year: Never true    Ran Out of Food in the Last Year: Never true  Transportation Needs: Unknown (12/12/2023)   Received from [facility] - Transportation    In the past 12 months, has lack of transportation kept you from medical appointments or from getting medications?: No    Lack of Transportation (Non-Medical): Not on file  Physical Activity: Unknown (04/05/2023)   Exercise Vital Sign    Days of Exercise  per Week: 0 days    Minutes of Exercise per Session: Not on file  Recent Concern: Physical Activity - Inactive (04/05/2023)   Exercise Vital Sign    Days of Exercise per Week: 0 days    Minutes of Exercise per Session: 0 min  Stress: No Stress Concern Present (04/05/2023)   [employer] of Occupational Health - Occupational Stress Questionnaire    Feeling of Stress : Not at all  Social Connections: Unknown (04/05/2023)   Social Connection and Isolation Panel [NHANES]  Frequency of Communication with Friends and Family: Once a week    Frequency of Social Gatherings with Friends and Family: Once a week    Attends Religious Services: More than 4 times per year    Active Member of [employer] or Organizations: Not on file    Attends Banker Meetings: Not on file    Marital Status: Divorced  Intimate Partner Violence: Not At Risk (06/26/2022)   Humiliation, Afraid, Rape, and Kick questionnaire    Fear of Current or Ex-Partner: No    Emotionally Abused: No    Physically Abused: No    Sexually Abused: No    Functional Status Survey:    Family History  Problem Relation Age of Onset   Osteoporosis Mother    Lung cancer Mother    Stroke Mother    Diabetes Father    Hypertension Father    Osteoporosis Father    Colon cancer Father 32   Breast cancer Sister        Age 16   Leukemia Maternal Grandfather    Crohn's disease Son    Ovarian cancer Maternal Aunt 71   Breast cancer Maternal Aunt        great aunt- Age unknown   Colon cancer Paternal Uncle 18   Breast cancer Cousin        maternal-Age 60   Colon cancer Cousin        maternal first cousin    Health Maintenance  Topic Date Due   Medicare Annual Wellness (AWV)  06/27/2023   COVID-19 Vaccine (6 - 2024-25 season) 07/01/2023   Colonoscopy  05/28/2024   DTaP/Tdap/Td (3 - Td or Tdap) 01/02/2029   Pneumonia Vaccine 23+ Years old  Completed   INFLUENZA VACCINE  Completed   DEXA SCAN  Completed   Hepatitis C  Screening  Completed   Zoster Vaccines- Shingrix  Completed   HPV VACCINES  Aged Out    Allergies  Allergen Reactions   Doxycycline Rash   Other Other (See Comments)    Surgical glue : hives   Oxycodone Nausea And Vomiting   Sulfa Antibiotics Hives   Trimethoprim Sulfate [Trimethoprim]     rash   Codeine Nausea Only   Propranolol Hcl Other (See Comments)    "Dizziness and heart racing"    Outpatient Encounter Medications as of 12/28/2023  Medication Sig   acetaminophen (TYLENOL) 500 MG tablet Take 1,000 mg by mouth every 8 (eight) hours as needed.   calcium carbonate (TUMS EX) 750 MG chewable tablet Chew 2 tablets by mouth every 8 (eight) hours as needed for heartburn.   DULoxetine (CYMBALTA) 30 MG capsule Take 30 mg by mouth daily. Take one capsule by mouth twice daily.   famotidine (PEPCID) 20 MG tablet Take 20 mg by mouth 2 (two) times daily.   gabapentin (NEURONTIN) 300 MG capsule Take 300 mg by mouth at bedtime.   lidocaine 4 % Place 1 patch onto the skin daily.   linaclotide (LINZESS) 145 MCG CAPS capsule Take 145 mcg by mouth daily before breakfast.   nitrofurantoin, macrocrystal-monohydrate, (MACROBID) 100 MG capsule Take 1 capsule (100 mg total) by mouth daily.   omeprazole (PRILOSEC) 40 MG capsule TAKE 1 CAPSULE (40 MG TOTAL) BY MOUTH DAILY.   ondansetron (ZOFRAN-ODT) 4 MG disintegrating tablet TAKE 1 TABLET BY MOUTH EVERY 8 HOURS AS NEEDED FOR NAUSEA OR VOMITING   polyethylene glycol (MIRALAX / GLYCOLAX) 17 g packet Take 17 g by mouth 2 (two) times daily.  Probiotic Product (PROBIOTIC PO) Take 1 tablet by mouth daily.   Sennosides-Docusate Sodium 8.6-50 MG CAPS Take 2 capsules by mouth at bedtime.   valACYclovir (VALTREX) 500 MG tablet TAKE 1 TABLET (500 MG TOTAL) BY MOUTH DAILY. 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   [DISCONTINUED] clorazepate (TRANXENE) 7.5 MG tablet Take 1 tablet (7.5 mg total) by mouth at bedtime as needed for anxiety.   [DISCONTINUED] methenamine (HIPREX) 1 g  tablet Take 1 tablet (1 g total) by mouth 2 (two) times daily with a meal. Take with 500mg  of vitamin c w/ each tablet (Patient taking differently: Take 1 g by mouth 2 (two) times daily with a meal.)   clorazepate (TRANXENE) 7.5 MG tablet Take 1 tablet (7.5 mg total) by mouth at bedtime as needed for anxiety.   [DISCONTINUED] B Complex-C (B-COMPLEX WITH VITAMIN C) tablet Take 1 tablet by mouth daily. (Patient not taking: Reported on 12/28/2023)   [DISCONTINUED] cetirizine (ZYRTEC) 10 MG tablet Take 10 mg by mouth daily. (Patient not taking: Reported on 12/28/2023)   [DISCONTINUED] Cholecalciferol (VITAMIN D) 2000 units CAPS Take 2,000 Units by mouth daily. (Patient not taking: Reported on 12/28/2023)   [DISCONTINUED] CRANBERRY PO Take 2 tablets by mouth in the morning and at bedtime. (Patient not taking: Reported on 12/28/2023)   [DISCONTINUED] dicyclomine (BENTYL) 20 MG tablet TAKE 1 TABLET (20 MG TOTAL) BY MOUTH 3 (THREE) TIMES DAILY AS NEEDED FOR SPASMS. (Patient not taking: Reported on 12/28/2023)   [DISCONTINUED] famotidine (PEPCID) 20 MG tablet TAKE 1 TABLET BY MOUTH EVERY DAY (Patient taking differently: Take 20 mg by mouth 2 (two) times daily.)   [DISCONTINUED] methocarbamol (ROBAXIN) 500 MG tablet Take 500 mg by mouth every 8 (eight) hours as needed for muscle spasms. (Patient not taking: Reported on 12/28/2023)   No facility-administered encounter medications on file as of 12/28/2023.    Review of Systems  Vitals:   12/28/23 1034  BP: 129/69  Pulse: 77  Resp: 18  Temp: 97.6 F (36.4 C)  SpO2: 95%  Weight: 142 lb 8 oz (64.6 kg)  Height: 5\' 3"  (1.6 m)   Body mass index is 25.24 kg/m. Physical Exam Constitutional:      Appearance: Normal appearance.  Cardiovascular:     Rate and Rhythm: Normal rate and regular rhythm.     Pulses: Normal pulses.     Heart sounds: Normal heart sounds.  Pulmonary:     Effort: Pulmonary effort is normal.  Abdominal:     General: Abdomen is flat.  Bowel sounds are normal.     Palpations: Abdomen is soft.  Musculoskeletal:        General: No swelling or tenderness.  Skin:    General: Skin is warm and dry.     Comments:  SKIN: Surgical incision healing well, no signs of infection.   Neurological:     Mental Status: She is alert and oriented to person, place, and time.     Gait: Gait normal.  Psychiatric:        Mood and Affect: Mood normal.     Labs reviewed: Basic Metabolic Panel: Recent Labs    01/12/23 1024 11/12/23 1515  NA 134* 134*  K 4.2 4.1  CL 104 98  CO2 22 27  GLUCOSE 88 80  BUN 32* 21  CREATININE 0.93 0.95  CALCIUM 9.5 9.3   Liver Function Tests: Recent Labs    01/12/23 1024 11/12/23 1515  AST 15 20  ALT 12 19  ALKPHOS 86 76  BILITOT 0.3 0.4  PROT 7.0 6.7  ALBUMIN 4.3 4.6   No results for input(s): "LIPASE", "AMYLASE" in the last 8760 hours. No results for input(s): "AMMONIA" in the last 8760 hours. CBC: Recent Labs    01/12/23 1024 11/12/23 1515  WBC 7.3 6.4  NEUTROABS 4.5 3.5  HGB 12.7 12.6  HCT 36.6 36.5  MCV 95.6 99.5  PLT 339.0 273.0   Cardiac Enzymes: No results for input(s): "CKTOTAL", "CKMB", "CKMBINDEX", "TROPONINI" in the last 8760 hours. BNP: Invalid input(s): "POCBNP" Lab Results  Component Value Date   HGBA1C 5.3 12/26/2019   Lab Results  Component Value Date   TSH 2.73 03/23/2023   Lab Results  Component Value Date   VITAMINB12 205 04/25/2020   Lab Results  Component Value Date   FOLATE 23.0 04/25/2020   Lab Results  Component Value Date   IRON 59 01/07/2021   TIBC CANCELED 05/19/2020   FERRITIN 16.8 01/07/2021    Imaging and Procedures obtained prior to SNF admission: NM Bone Scan Whole Body Result Date: 07/13/2023 CLINICAL DATA:  Metastatic disease evaluation.  Left breast cancer. EXAM: NUCLEAR MEDICINE WHOLE BODY BONE SCAN TECHNIQUE: Whole body anterior and posterior images were obtained approximately 3 hours after intravenous injection of  radiopharmaceutical. RADIOPHARMACEUTICALS:  20.0 mCi Technetium-87m MDP IV COMPARISON:  Chest CT 08/30/2021.  Abdominopelvic CT 01/29/2023. FINDINGS: There is no osseous uptake suspicious for metastatic disease. Convex right thoracolumbar scoliosis is associated with scattered degenerative uptake, primarily along the convex margin of the scoliosis. There is additional probable degenerative uptake in the mid cervical spine on the left, in the right hand, both knees and feet. Soft tissue activity appears unremarkable. IMPRESSION: 1. No evidence of osseous metastatic disease. 2. Multilevel arthropathic uptake as described. Electronically Signed   By: Carey Bullocks M.D.   On: 07/13/2023 08:52    Assessment/Plan Postoperative Ileus Postoperative ileus following spine surgery, readmitted due to ileus with nausea and constipation. Prefers Miralax and Senna over Linzess due to cost and side effects. - Discontinue Linzess - Initiate Miralax and Senna regimen - Encourage small, frequent meals to manage nausea  Postoperative Pain - s/p Lumbar  spinal fusion Postoperative pain managed with Tylenol and gabapentin. Prefers Tylenol due to sensitivity to medications and potential nausea from gabapentin. - Administer Tylenol 1000 mg as needed every 8 hours - Make gabapentin as needed - Monitor for nausea and adjust medications accordingly  Urinary Tract Infection (UTI) UTI diagnosed during readmission, treated with Macrobid. Recurrent UTIs managed with methenamine and vitamin C prophylaxis. - Continue Macrobid for UTI treatment - Restart methenamine with vitamin C twice daily for UTI prophylaxis  Gastroesophageal Reflux Disease (GERD) GERD managed with famotidine and omeprazole. Prefers omeprazole in the morning and famotidine at night. - Administer famotidine at night - Administer omeprazole in the morning  Insomnia Uses clorazepate (Tranxene) for insomnia due to minimal side effects, sparingly to  maintain efficacy. - Administer clorazepate as needed for sleep  General Health Maintenance Vegan diet, generally has loose bowel movements. No issues with constipation prior to surgery. Weight returned to baseline after fluid overload post-surgery. - Encourage a balanced vegan diet - Monitor weight and fluid status  Goals of Care Discussed advanced directives and preferences in the event of cardiac arrest. Prefers to pass peacefully if no pulse is detected, understanding low likelihood of full recovery after resuscitation at advanced age. - Document DNR status in the chart  Follow-up - Monitor progress in therapy - Reassess if any changes in  condition.  Family/ staff Communication: Daugher, DIL, nursing  Labs/tests ordered: CBC, BMP  I spent greater than 45  minutes for the care of this patient in face to face time, chart review, clinical documentation, patient education. I spent an additional 16 minutes discussing goals of care and advanced care planning.

## 2024-01-07 DIAGNOSIS — M79651 Pain in right thigh: Secondary | ICD-10-CM | POA: Diagnosis not present

## 2024-01-07 DIAGNOSIS — M25561 Pain in right knee: Secondary | ICD-10-CM | POA: Diagnosis not present

## 2024-01-07 DIAGNOSIS — K5981 Ogilvie syndrome: Secondary | ICD-10-CM | POA: Insufficient documentation

## 2024-01-07 DIAGNOSIS — M25551 Pain in right hip: Secondary | ICD-10-CM | POA: Diagnosis not present

## 2024-01-08 DIAGNOSIS — S82001A Unspecified fracture of right patella, initial encounter for closed fracture: Secondary | ICD-10-CM | POA: Diagnosis not present

## 2024-01-09 DIAGNOSIS — Z4789 Encounter for other orthopedic aftercare: Secondary | ICD-10-CM | POA: Diagnosis not present

## 2024-01-14 ENCOUNTER — Inpatient Hospital Stay: Admitting: Internal Medicine

## 2024-01-15 ENCOUNTER — Telehealth: Payer: Self-pay

## 2024-01-15 ENCOUNTER — Telehealth: Payer: Self-pay | Admitting: Emergency Medicine

## 2024-01-15 DIAGNOSIS — N390 Urinary tract infection, site not specified: Secondary | ICD-10-CM | POA: Diagnosis not present

## 2024-01-15 DIAGNOSIS — G4700 Insomnia, unspecified: Secondary | ICD-10-CM | POA: Diagnosis not present

## 2024-01-15 DIAGNOSIS — D649 Anemia, unspecified: Secondary | ICD-10-CM | POA: Diagnosis not present

## 2024-01-15 DIAGNOSIS — K219 Gastro-esophageal reflux disease without esophagitis: Secondary | ICD-10-CM | POA: Diagnosis not present

## 2024-01-15 DIAGNOSIS — M4004 Postural kyphosis, thoracic region: Secondary | ICD-10-CM | POA: Diagnosis not present

## 2024-01-15 DIAGNOSIS — F419 Anxiety disorder, unspecified: Secondary | ICD-10-CM | POA: Diagnosis not present

## 2024-01-15 DIAGNOSIS — J479 Bronchiectasis, uncomplicated: Secondary | ICD-10-CM | POA: Diagnosis not present

## 2024-01-15 DIAGNOSIS — M25562 Pain in left knee: Secondary | ICD-10-CM | POA: Diagnosis not present

## 2024-01-15 DIAGNOSIS — K9189 Other postprocedural complications and disorders of digestive system: Secondary | ICD-10-CM | POA: Diagnosis not present

## 2024-01-15 DIAGNOSIS — K5981 Ogilvie syndrome: Secondary | ICD-10-CM | POA: Diagnosis not present

## 2024-01-15 NOTE — Telephone Encounter (Signed)
 Spoke w/ Arlys John- verbal orders given.

## 2024-01-15 NOTE — Telephone Encounter (Signed)
 Copied from CRM [id number]. Topic: Clinical - Home Health Verbal Orders >> Jan 15, 2024  3:35 PM Marica Otter wrote: Caller/Agency: [facility] Callback Number: [phone] Secure line Service Requested: Physical Therapy, Home Health Aide Frequency: 1x week for 8 weeks, 2x week for 2 weeks Any new concerns about the patient? No

## 2024-01-15 NOTE — Transitions of Care (Post Inpatient/ED Visit) (Signed)
 01/15/2024  Name: Cynthia Thornton MRN: [id number] DOB: [birth year]  Today's TOC FU Call Status: Today's TOC FU Call Status:: Successful TOC FU Call Completed TOC FU Call Complete Date: 01/15/24 Patient's Name and Date of Birth confirmed.  Transition Care Management Follow-up Telephone Call Date of Discharge: 01/14/24 Discharge Facility: Other [facility]) Name of Other (Non[facility]) Discharge Facility: [facility] Type of Discharge: Inpatient Admission Primary Inpatient Discharge Diagnosis:: spinal fusion How have you been since you were released from the hospital?: Better Any questions or concerns?: No  Items Reviewed: Did you receive and understand the discharge instructions provided?: Yes Medications obtained,verified, and reconciled?: Yes (Medications Reviewed) Any new allergies since your discharge?: No Dietary orders reviewed?: Yes Do you have support at home?: Yes People in Home: sibling(s)  Medications Reviewed Today: Medications Reviewed Today     Reviewed by Karena Addison, LPN (Licensed Practical Nurse) on 01/15/24 at 1001  Med List Status: <None>   Medication Order Taking? Sig Documenting Provider Last Dose Status Informant  acetaminophen (TYLENOL) 500 MG tablet [id number] No Take 1,000 mg by mouth every 8 (eight) hours as needed. [provider] Taking Active   calcium carbonate (TUMS EX) 750 MG chewable tablet [id number] No Chew 2 tablets by mouth every 8 (eight) hours as needed for heartburn. [provider] Taking Active   clorazepate (TRANXENE) 7.5 MG tablet [id number]  Take 1 tablet (7.5 mg total) by mouth at bedtime as needed for anxiety. Earnestine Mealing, MD  Active   DULoxetine (CYMBALTA) 30 MG capsule [id number] No Take 30 mg by mouth daily. Take one capsule by mouth twice daily. [provider] Taking Active   famotidine (PEPCID) 20 MG tablet [id number] No Take 20 mg by mouth 2 (two) times daily. [provider] Taking  Active   gabapentin (NEURONTIN) 300 MG capsule [id number] No Take 300 mg by mouth at bedtime. [provider] Taking Active   lidocaine 4 % [id number] No Place 1 patch onto the skin daily. [provider] Taking Active   linaclotide Karlene Einstein) 145 MCG CAPS capsule [id number] No Take 145 mcg by mouth daily before breakfast. [provider] Taking Active   nitrofurantoin, macrocrystal-monohydrate, (MACROBID) 100 MG capsule [id number] No Take 1 capsule (100 mg total) by mouth daily. Selmer Dominion, NP Taking Active   omeprazole (PRILOSEC) 40 MG capsule [id number] No TAKE 1 CAPSULE (40 MG TOTAL) BY MOUTH DAILY. Doree Albee, PA-C Taking Active   ondansetron (ZOFRAN-ODT) 4 MG disintegrating tablet [id number] No TAKE 1 TABLET BY MOUTH EVERY 8 HOURS AS NEEDED FOR NAUSEA OR VOMITING [name], Nolon Rod, MD Taking Active   polyethylene glycol (MIRALAX / GLYCOLAX) 17 g packet [id number] No Take 17 g by mouth 2 (two) times daily. [provider] Taking Active   Probiotic Product (PROBIOTIC PO) [id number] No Take 1 tablet by mouth daily. [provider] Taking Active   Sennosides-Docusate Sodium 8.6-50 MG CAPS [id number] No Take 2 capsules by mouth at bedtime. [provider] Taking Active   valACYclovir (VALTREX) 500 MG tablet [id number] No TAKE 1 TABLET (500 MG TOTAL) BY MOUTH DAILY. 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 Rosalyn Gess, MD Taking Active             Home Care and Equipment/Supplies: Were Home Health Services Ordered?: Yes Name of Home Health Agency:: unknown Has Agency set up a time to come to your home?: Yes First Home Health Visit Date: 01/15/24 Any new equipment or medical supplies ordered?: NA  Functional Questionnaire:  Do you need assistance with bathing/showering or dressing?: Yes Do you need assistance with meal preparation?: Yes Do you need assistance with eating?: No Do you have difficulty maintaining continence: No Do you need assistance  with getting out of bed/getting out of a chair/moving?: Yes Do you have difficulty managing or taking your medications?: No  Follow up appointments reviewed: PCP Follow-up appointment confirmed?: Yes Date of PCP follow-up appointment?: 01/28/24 Follow-up Provider: [facility] Follow-up appointment confirmed?: Yes Date of Specialist follow-up appointment?: 03/07/24 Follow-Up Specialty Provider:: surgeon Do you need transportation to your follow-up appointment?: No Do you understand care options if your condition(s) worsen?: Yes-patient verbalized understanding    SIGNATURE Karena Addison, LPN [facility] Nurse Health Advisor Direct Dial [phone]

## 2024-01-17 DIAGNOSIS — F419 Anxiety disorder, unspecified: Secondary | ICD-10-CM | POA: Diagnosis not present

## 2024-01-17 DIAGNOSIS — K219 Gastro-esophageal reflux disease without esophagitis: Secondary | ICD-10-CM | POA: Diagnosis not present

## 2024-01-17 DIAGNOSIS — D649 Anemia, unspecified: Secondary | ICD-10-CM | POA: Diagnosis not present

## 2024-01-17 DIAGNOSIS — M4004 Postural kyphosis, thoracic region: Secondary | ICD-10-CM | POA: Diagnosis not present

## 2024-01-17 DIAGNOSIS — J479 Bronchiectasis, uncomplicated: Secondary | ICD-10-CM | POA: Diagnosis not present

## 2024-01-17 DIAGNOSIS — K9189 Other postprocedural complications and disorders of digestive system: Secondary | ICD-10-CM | POA: Diagnosis not present

## 2024-01-17 DIAGNOSIS — N390 Urinary tract infection, site not specified: Secondary | ICD-10-CM | POA: Diagnosis not present

## 2024-01-17 DIAGNOSIS — K5981 Ogilvie syndrome: Secondary | ICD-10-CM | POA: Diagnosis not present

## 2024-01-17 DIAGNOSIS — G4700 Insomnia, unspecified: Secondary | ICD-10-CM | POA: Diagnosis not present

## 2024-01-18 ENCOUNTER — Inpatient Hospital Stay: Admitting: Internal Medicine

## 2024-01-22 DIAGNOSIS — D649 Anemia, unspecified: Secondary | ICD-10-CM | POA: Diagnosis not present

## 2024-01-22 DIAGNOSIS — M4004 Postural kyphosis, thoracic region: Secondary | ICD-10-CM | POA: Diagnosis not present

## 2024-01-22 DIAGNOSIS — K5981 Ogilvie syndrome: Secondary | ICD-10-CM | POA: Diagnosis not present

## 2024-01-22 DIAGNOSIS — G4700 Insomnia, unspecified: Secondary | ICD-10-CM | POA: Diagnosis not present

## 2024-01-22 DIAGNOSIS — F419 Anxiety disorder, unspecified: Secondary | ICD-10-CM | POA: Diagnosis not present

## 2024-01-22 DIAGNOSIS — J479 Bronchiectasis, uncomplicated: Secondary | ICD-10-CM | POA: Diagnosis not present

## 2024-01-22 DIAGNOSIS — K9189 Other postprocedural complications and disorders of digestive system: Secondary | ICD-10-CM | POA: Diagnosis not present

## 2024-01-22 DIAGNOSIS — N390 Urinary tract infection, site not specified: Secondary | ICD-10-CM | POA: Diagnosis not present

## 2024-01-22 DIAGNOSIS — K219 Gastro-esophageal reflux disease without esophagitis: Secondary | ICD-10-CM | POA: Diagnosis not present

## 2024-01-23 ENCOUNTER — Encounter: Payer: Self-pay | Admitting: Internal Medicine

## 2024-01-23 ENCOUNTER — Ambulatory Visit: Admitting: Internal Medicine

## 2024-01-23 VITALS — BP 114/72 | HR 89 | Temp 98.0°F | Resp 16 | Ht 62.5 in | Wt 142.0 lb

## 2024-01-23 DIAGNOSIS — S82001A Unspecified fracture of right patella, initial encounter for closed fracture: Secondary | ICD-10-CM | POA: Diagnosis not present

## 2024-01-23 DIAGNOSIS — Z9889 Other specified postprocedural states: Secondary | ICD-10-CM

## 2024-01-23 DIAGNOSIS — K5981 Ogilvie syndrome: Secondary | ICD-10-CM | POA: Diagnosis not present

## 2024-01-23 LAB — CBC WITH DIFFERENTIAL/PLATELET
Basophils Absolute: 0.1 10*3/uL (ref 0.0–0.1)
Basophils Relative: 1.1 % (ref 0.0–3.0)
Eosinophils Absolute: 0.3 10*3/uL (ref 0.0–0.7)
Eosinophils Relative: 5 % (ref 0.0–5.0)
HCT: 38.7 % (ref 36.0–46.0)
Hemoglobin: 12.8 g/dL (ref 12.0–15.0)
Lymphocytes Relative: 17.2 % (ref 12.0–46.0)
Lymphs Abs: 1.2 10*3/uL (ref 0.7–4.0)
MCHC: 33.1 g/dL (ref 30.0–36.0)
MCV: 94.9 fl (ref 78.0–100.0)
Monocytes Absolute: 0.5 10*3/uL (ref 0.1–1.0)
Monocytes Relative: 7.8 % (ref 3.0–12.0)
Neutro Abs: 4.7 10*3/uL (ref 1.4–7.7)
Neutrophils Relative %: 68.9 % (ref 43.0–77.0)
Platelets: 364 10*3/uL (ref 150.0–400.0)
RBC: 4.07 Mil/uL (ref 3.87–5.11)
RDW: 13.8 % (ref 11.5–15.5)
WBC: 6.8 10*3/uL (ref 4.0–10.5)

## 2024-01-23 LAB — BASIC METABOLIC PANEL
BUN: 11 mg/dL (ref 6–23)
CO2: 25 meq/L (ref 19–32)
Calcium: 9.9 mg/dL (ref 8.4–10.5)
Chloride: 100 meq/L (ref 96–112)
Creatinine, Ser: 0.74 mg/dL (ref 0.40–1.20)
GFR: 78.91 mL/min (ref >60.00)
Glucose, Bld: 95 mg/dL (ref 70–99)
Potassium: 4 meq/L (ref 3.5–5.1)
Sodium: 136 meq/L (ref 135–145)

## 2024-01-23 NOTE — Progress Notes (Unsigned)
 Subjective:    Patient ID: Cynthia Thornton, female    DOB: 01/24/1946, 78 y.o.   MRN: [id number]  DOS:  01/23/2024 Type of visit - description: Hospital follow-up  Had extensive back surgery 12/03/2023, was discharged home on POD #7.  Readmitted to the hospital 12/11/2023 for a little over 2 weeks: Urinary retention, hyponatremia, ileus, Ogilvie syndrome.  Subsequently discharged to SNF, unfortunately had a fall while there--  R patella fracture, eventually discharged from [facility] 01/07/2024. Saw ortho 01/08/2024.  Workup: 12/13/2023: Liver MRI, previously lesion seen on CT: Benign hepatic hemangioma CT chest 12/20/2023: Scattered pulmonary micronodules nonspecific.  16-month follow-up if high risk for lung cancer. Had a EGD 12/21/2023 12/23/2023: Urine culture + Klebsiella.  12/24/2023: Potassium 3.3, creatinine 0.9, calcium 9.2 low.  Hemoglobin 10.4.  Review of Systems Since she left the SNF is at home. Denies fever or chills. Appetite is good.  No nausea or vomiting. She is doing physical therapy at home, uses a walker consistently. No driving. No LUTS. She is clearly frustrated about the chain of events that happened since her back surgery however back pain is not an issue at this point  Past Medical History:  Diagnosis Date   Annual physical exam 07/27/2015   Anxiety 03/29/2019   Arthritis    Breast cancer, left [facility]) oncologist-- dr Pamelia Hoit   dx 09-26-2017--- IDC, Stage IA, Grade 2, ER/PR +;  HER2 negative;  s/p  breast lumpectomy w/ node dissection 11-02-2017;  completed radiation 01-09-2018 (left breast genetic screening panel 2017 with variant of unknown significance AXINA)   Bronchiectasis (HCC)    pulmologist-- dr Sandria Manly-- w/ chronic lingular scarring  (last exceratbation bronchiectasis 07/ 2020)   Change in bowel habits 09/10/2014   COLONIC POLYPS, BENIGN 01/16/2008   Qualifier: Diagnosis of   By: Misty Stanley CMA (AAMA), Amanda         DEGENERATIVE JOINT DISEASE 01/16/2008    Qualifier: Diagnosis of   By: Misty Stanley CMA (AAMA), Amanda      Replacing diagnoses that were inactivated after the 01/29/23 regulatory import     Dyslipidemia    followed by cardiology/ vascular-- dr g. adams ([facility] in [place])  family history strokes   Eczema    ESOPHAGEAL STRICTURE 04/05/2007   Annotation: per EGD  Qualifier: Diagnosis of   By: Drue Novel MD, Nolon Rod.        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  The Comprehensive Cancer Panel offered by [facility] includes sequencing and/or deletion duplication testing of the following 32 genes: APC, ATM, AXIN2, BARD1, BMPR1A, BRCA1, BRCA2, BRIP1, CDH1, CDK4, CDKN2A, CHEK2, EPCAM, FANCC, MLH1, MSH2, MSH6, MUTYH, NBN, PALB2, PMS2, POLD1, POLE, PTEN, RAD51C, RAD51D, SCG5/GREM1, SMAD4, STK1   GERD 07/30/2006   Qualifier: Diagnosis of   By: Drue Novel MD, Theora Master, MULTINODULAR 11/27/2008   Qualifier: Diagnosis of   By: Everardo All MD, Sean A        HIATAL HERNIA 08/15/2006   Qualifier: Diagnosis of   By: Misty Stanley CMA (AAMA), Amanda      Replacing diagnoses that were inactivated after the 01/29/23 regulatory import     History of esophageal stricture    s/p dilatation 2008   History of external beam radiation therapy    left breast 12-19-2017  to 01-09-2018   History of  hyperthyroidism    endocrinologist-- (lov 04-11-2019 epic) dr Everardo All, dx 2009 due to multinodular goiter ,  had taken medication for few months 2010 stopped due to norma TFT;     Hypercholesteremia 10/03/2010   Overview:   Chronic [Hypercholesterolemia]     Insomnia    Irritation of left eye 02/07/2022   Malignant neoplasm of lower-inner quadrant of left breast in female, estrogen receptor positive (HCC) 10/02/2017   Osteopenia 02/2018   T score -2.4 distal third of radius.  FRAX 11% / 1.8% stable at other points of interest to  include spine, right and left hip   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   Smoking history 01/15/2015   Thyrotoxicosis 10/22/2008   Qualifier: Diagnosis of   By: Alwyn Ren MD, William         Tinnitus 06/27/2019   UNSPECIFIED ANEMIA 11/02/2008   Qualifier: Diagnosis of   By: Alwyn Ren MD, Chrissie Noa         Wears contact lenses     Past Surgical History:  Procedure Laterality Date   BREAST LUMPECTOMY WITH RADIOACTIVE SEED AND SENTINEL LYMPH NODE BIOPSY Left 11/02/2017   Procedure: LEFT BREAST LUMPECTOMY WITH RADIOACTIVE SEED AND LEFT AXILLARY DEEP SENTINEL LYMPH NODE BIOPSY, INJECT BLUE DYE LEFT BREAST;  Surgeon: Claud Kelp, MD;  Location: [facility];  Service: General;  Laterality: Left;   BREAST SURGERY  2000   Breast cyst removed, left   LAPAROSCOPIC BILATERAL SALPINGO OOPHERECTOMY Bilateral 10/13/2019   Procedure: LAPAROSCOPIC BILATERAL SALPINGO OOPHORECTOMY LYSIS OF ADHESIONS PERITONEAL WASHINGS ;  Surgeon: Genia Del, MD;  Location: [facility] [place];  Service: Gynecology;  Laterality: Bilateral;  request 7:30am OR time in [place] [facility] block requests one hour   TONSILLECTOMY  age 32   TUBAL LIGATION Bilateral yrs ago   VAGINAL HYSTERECTOMY  1991   Right ovarian cystectomy   VIDEO BRONCHOSCOPY Bilateral 03/16/2015   Procedure: VIDEO BRONCHOSCOPY WITHOUT FLUORO;  Surgeon: Kalman Shan, MD;  Location: [facility];  Service: Endoscopy;  Laterality: Bilateral;    Current Outpatient Medications  Medication Instructions   acetaminophen (TYLENOL) 1,000 mg, Every 8 hours PRN   calcium carbonate (TUMS EX) 750 MG chewable tablet 2 tablets, Every 8 hours PRN   clorazepate (TRANXENE) 7.5 mg, Oral, At bedtime PRN   DULoxetine (CYMBALTA) 30 mg, Daily   famotidine  (PEPCID) 20 mg, 2 times daily   gabapentin (NEURONTIN) 300 mg, Daily at bedtime   lidocaine 4 % 1 patch, Every 24 hours   nitrofurantoin (macrocrystal-monohydrate) (MACROBID) 100 mg, Oral, Daily   omeprazole (PRILOSEC) 40 mg, Oral, Daily   ondansetron (ZOFRAN-ODT) 4 mg, Oral, Every 8 hours PRN   Probiotic Product (PROBIOTIC PO) 1 tablet, Daily   valACYclovir (VALTREX) 500 mg, Oral, Daily, 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       Objective:   Physical Exam BP 114/72   Pulse 89   Temp 98 F (36.7 C) (Oral)   Resp 16   Ht 5' 2.5" (1.588 m)   Wt 142 lb (64.4 kg)   SpO2 99%   BMI 25.56 kg/m  General:   Well developed, NAD, BMI noted. HEENT:  Normocephalic . Face symmetric, atraumatic Lungs:  CTA B Normal respiratory effort, no intercostal retractions, no accessory muscle use. Heart: RRR,  no  murmur.  Lower extremities: no pretibial edema bilaterally  Skin: Not pale. Not jaundice Neurologic:  alert & oriented X3.  Speech normal, gait assisted by a roller walker Psych--  Cognition and judgment appear intact.  Cooperative with normal attention span and concentration.  Behavior appropriate. Tearful at times during the visit.    Assessment   ASSESSMENT Bronchiectasis Dr Marchelle Gearing Anxiety - Insomnia : tranxene   At hs, rx by gyn, previously, I'll Rx if needed  Endocrinology Dr. Everardo All q 3 years, Last visit 03/2015 --H/o thyrotoxicosis on remission --Goiter   Osteopenia --T score -1.5  (2015), dexa 02-2016 , dexa 03/19/2018 (at gyn) DJD -- [facility]: GERD, HH and esophageal stricture EGD 2007, colon polyps Breast cancer, DX 08-2017, s/p lumpectomy,  XRT, B salpingo-oophorectomy Recurrent UTIs/OAB., onset ~ 2018.  Saw urogynecology [id number]   PLAN Extensive back surgery follow-up by urinary retention, hyponatremia, ileus, Ogilvie syndrome. After extensive back surgery, she was readmitted to the hospital with a number of problems (see HPI). Subsequently went to a SNF, had a fall,  had a right patella fracture. Fortunately, she is now at home, overall feeling okay, with no back pain. Had a Klebsiella UTI, was treated with antibiotics, now asymptomatic and taking Macrobid for recurrent UTIs. She is frustrated about the events after the surgery, listening therapy provided, I think that she will be much better in the following months. Plan: BMP, CBC. RTC CPX 4 months

## 2024-01-23 NOTE — Patient Instructions (Addendum)
 It was good to see you today!    GO TO THE LAB : Get the blood work     Please go to the front desk: Ask for a physical exam in July 2025

## 2024-01-24 ENCOUNTER — Encounter: Payer: Self-pay | Admitting: Internal Medicine

## 2024-01-24 DIAGNOSIS — K5981 Ogilvie syndrome: Secondary | ICD-10-CM | POA: Diagnosis not present

## 2024-01-24 DIAGNOSIS — J479 Bronchiectasis, uncomplicated: Secondary | ICD-10-CM | POA: Diagnosis not present

## 2024-01-24 DIAGNOSIS — K219 Gastro-esophageal reflux disease without esophagitis: Secondary | ICD-10-CM | POA: Diagnosis not present

## 2024-01-24 DIAGNOSIS — N390 Urinary tract infection, site not specified: Secondary | ICD-10-CM | POA: Diagnosis not present

## 2024-01-24 DIAGNOSIS — K9189 Other postprocedural complications and disorders of digestive system: Secondary | ICD-10-CM | POA: Diagnosis not present

## 2024-01-24 DIAGNOSIS — F419 Anxiety disorder, unspecified: Secondary | ICD-10-CM | POA: Diagnosis not present

## 2024-01-24 DIAGNOSIS — D649 Anemia, unspecified: Secondary | ICD-10-CM | POA: Diagnosis not present

## 2024-01-24 DIAGNOSIS — M4004 Postural kyphosis, thoracic region: Secondary | ICD-10-CM | POA: Diagnosis not present

## 2024-01-24 DIAGNOSIS — G4700 Insomnia, unspecified: Secondary | ICD-10-CM | POA: Diagnosis not present

## 2024-01-24 NOTE — Assessment & Plan Note (Signed)
 Extensive back surgery follow-up by urinary retention, hyponatremia, ileus, Ogilvie syndrome. After extensive back surgery, she was readmitted to the hospital with a number of problems (see HPI). Subsequently went to a SNF, had a fall, had a right patella fracture. Fortunately, she is now at home, overall feeling okay, with no back pain. Had a Klebsiella UTI, was treated with antibiotics, now asymptomatic and taking Macrobid for recurrent UTIs. She is frustrated about the events after the surgery, listening therapy provided, I think that she will be much better in the following months. Plan: BMP, CBC. RTC CPX 4 months

## 2024-01-28 ENCOUNTER — Inpatient Hospital Stay: Admitting: Internal Medicine

## 2024-01-29 DIAGNOSIS — N390 Urinary tract infection, site not specified: Secondary | ICD-10-CM | POA: Diagnosis not present

## 2024-01-29 DIAGNOSIS — J479 Bronchiectasis, uncomplicated: Secondary | ICD-10-CM | POA: Diagnosis not present

## 2024-01-29 DIAGNOSIS — F419 Anxiety disorder, unspecified: Secondary | ICD-10-CM | POA: Diagnosis not present

## 2024-01-29 DIAGNOSIS — K9189 Other postprocedural complications and disorders of digestive system: Secondary | ICD-10-CM | POA: Diagnosis not present

## 2024-01-29 DIAGNOSIS — D649 Anemia, unspecified: Secondary | ICD-10-CM | POA: Diagnosis not present

## 2024-01-29 DIAGNOSIS — M4004 Postural kyphosis, thoracic region: Secondary | ICD-10-CM | POA: Diagnosis not present

## 2024-01-29 DIAGNOSIS — K5981 Ogilvie syndrome: Secondary | ICD-10-CM | POA: Diagnosis not present

## 2024-01-29 DIAGNOSIS — K219 Gastro-esophageal reflux disease without esophagitis: Secondary | ICD-10-CM | POA: Diagnosis not present

## 2024-01-29 DIAGNOSIS — G4700 Insomnia, unspecified: Secondary | ICD-10-CM | POA: Diagnosis not present

## 2024-01-30 DIAGNOSIS — N390 Urinary tract infection, site not specified: Secondary | ICD-10-CM | POA: Diagnosis not present

## 2024-01-30 DIAGNOSIS — G4700 Insomnia, unspecified: Secondary | ICD-10-CM | POA: Diagnosis not present

## 2024-01-30 DIAGNOSIS — J479 Bronchiectasis, uncomplicated: Secondary | ICD-10-CM | POA: Diagnosis not present

## 2024-01-30 DIAGNOSIS — M4004 Postural kyphosis, thoracic region: Secondary | ICD-10-CM | POA: Diagnosis not present

## 2024-01-30 DIAGNOSIS — K219 Gastro-esophageal reflux disease without esophagitis: Secondary | ICD-10-CM | POA: Diagnosis not present

## 2024-01-30 DIAGNOSIS — D649 Anemia, unspecified: Secondary | ICD-10-CM | POA: Diagnosis not present

## 2024-01-30 DIAGNOSIS — F419 Anxiety disorder, unspecified: Secondary | ICD-10-CM | POA: Diagnosis not present

## 2024-01-30 DIAGNOSIS — K9189 Other postprocedural complications and disorders of digestive system: Secondary | ICD-10-CM | POA: Diagnosis not present

## 2024-01-30 DIAGNOSIS — K5981 Ogilvie syndrome: Secondary | ICD-10-CM | POA: Diagnosis not present

## 2024-02-04 DIAGNOSIS — K219 Gastro-esophageal reflux disease without esophagitis: Secondary | ICD-10-CM | POA: Diagnosis not present

## 2024-02-04 DIAGNOSIS — K5981 Ogilvie syndrome: Secondary | ICD-10-CM | POA: Diagnosis not present

## 2024-02-04 DIAGNOSIS — N390 Urinary tract infection, site not specified: Secondary | ICD-10-CM | POA: Diagnosis not present

## 2024-02-04 DIAGNOSIS — K9189 Other postprocedural complications and disorders of digestive system: Secondary | ICD-10-CM | POA: Diagnosis not present

## 2024-02-04 DIAGNOSIS — M4004 Postural kyphosis, thoracic region: Secondary | ICD-10-CM | POA: Diagnosis not present

## 2024-02-04 DIAGNOSIS — J479 Bronchiectasis, uncomplicated: Secondary | ICD-10-CM | POA: Diagnosis not present

## 2024-02-04 DIAGNOSIS — G4700 Insomnia, unspecified: Secondary | ICD-10-CM | POA: Diagnosis not present

## 2024-02-04 DIAGNOSIS — F419 Anxiety disorder, unspecified: Secondary | ICD-10-CM | POA: Diagnosis not present

## 2024-02-04 DIAGNOSIS — D649 Anemia, unspecified: Secondary | ICD-10-CM | POA: Diagnosis not present

## 2024-02-05 ENCOUNTER — Telehealth: Payer: Self-pay | Admitting: Internal Medicine

## 2024-02-05 NOTE — Telephone Encounter (Signed)
 Thanks, waiting or Pt to call w/ fax number.

## 2024-02-05 NOTE — Telephone Encounter (Signed)
 Received form- are you willing to complete, we have received multiple faxes from [facility] that she will not call them back to set up PT/OT.

## 2024-02-05 NOTE — Telephone Encounter (Signed)
 Pt dropped off form for provider to fill out Scientist, research (medical) -1 Page) Pt stated would like document to be faxed but did not have fax number with her, pt stated will call us with fax number, if not when document is ready sooner pt is OK to be called and can come to pick up document - Pt tel [phone]. Document put at front office tray under providers name.

## 2024-02-05 NOTE — Telephone Encounter (Signed)
Document signed.

## 2024-02-06 DIAGNOSIS — S82002A Unspecified fracture of left patella, initial encounter for closed fracture: Secondary | ICD-10-CM | POA: Diagnosis not present

## 2024-02-07 DIAGNOSIS — K9189 Other postprocedural complications and disorders of digestive system: Secondary | ICD-10-CM | POA: Diagnosis not present

## 2024-02-07 DIAGNOSIS — G4700 Insomnia, unspecified: Secondary | ICD-10-CM | POA: Diagnosis not present

## 2024-02-07 DIAGNOSIS — N390 Urinary tract infection, site not specified: Secondary | ICD-10-CM | POA: Diagnosis not present

## 2024-02-07 DIAGNOSIS — K5981 Ogilvie syndrome: Secondary | ICD-10-CM | POA: Diagnosis not present

## 2024-02-07 DIAGNOSIS — K219 Gastro-esophageal reflux disease without esophagitis: Secondary | ICD-10-CM | POA: Diagnosis not present

## 2024-02-07 DIAGNOSIS — F419 Anxiety disorder, unspecified: Secondary | ICD-10-CM | POA: Diagnosis not present

## 2024-02-07 DIAGNOSIS — D649 Anemia, unspecified: Secondary | ICD-10-CM | POA: Diagnosis not present

## 2024-02-07 DIAGNOSIS — J479 Bronchiectasis, uncomplicated: Secondary | ICD-10-CM | POA: Diagnosis not present

## 2024-02-07 DIAGNOSIS — M4004 Postural kyphosis, thoracic region: Secondary | ICD-10-CM | POA: Diagnosis not present

## 2024-02-07 NOTE — Telephone Encounter (Signed)
 Received fax confirmation

## 2024-02-07 NOTE — Telephone Encounter (Signed)
 Form faxed to number provided. Form sent for scanning.

## 2024-02-11 DIAGNOSIS — K5981 Ogilvie syndrome: Secondary | ICD-10-CM | POA: Diagnosis not present

## 2024-02-11 DIAGNOSIS — J479 Bronchiectasis, uncomplicated: Secondary | ICD-10-CM | POA: Diagnosis not present

## 2024-02-11 DIAGNOSIS — G4700 Insomnia, unspecified: Secondary | ICD-10-CM | POA: Diagnosis not present

## 2024-02-11 DIAGNOSIS — M4004 Postural kyphosis, thoracic region: Secondary | ICD-10-CM | POA: Diagnosis not present

## 2024-02-11 DIAGNOSIS — N390 Urinary tract infection, site not specified: Secondary | ICD-10-CM | POA: Diagnosis not present

## 2024-02-11 DIAGNOSIS — K9189 Other postprocedural complications and disorders of digestive system: Secondary | ICD-10-CM | POA: Diagnosis not present

## 2024-02-11 DIAGNOSIS — K219 Gastro-esophageal reflux disease without esophagitis: Secondary | ICD-10-CM | POA: Diagnosis not present

## 2024-02-11 DIAGNOSIS — F419 Anxiety disorder, unspecified: Secondary | ICD-10-CM | POA: Diagnosis not present

## 2024-02-11 DIAGNOSIS — D649 Anemia, unspecified: Secondary | ICD-10-CM | POA: Diagnosis not present

## 2024-02-13 DIAGNOSIS — K5981 Ogilvie syndrome: Secondary | ICD-10-CM | POA: Diagnosis not present

## 2024-02-13 DIAGNOSIS — F419 Anxiety disorder, unspecified: Secondary | ICD-10-CM | POA: Diagnosis not present

## 2024-02-13 DIAGNOSIS — D649 Anemia, unspecified: Secondary | ICD-10-CM | POA: Diagnosis not present

## 2024-02-13 DIAGNOSIS — J479 Bronchiectasis, uncomplicated: Secondary | ICD-10-CM | POA: Diagnosis not present

## 2024-02-13 DIAGNOSIS — K9189 Other postprocedural complications and disorders of digestive system: Secondary | ICD-10-CM | POA: Diagnosis not present

## 2024-02-13 DIAGNOSIS — M4004 Postural kyphosis, thoracic region: Secondary | ICD-10-CM | POA: Diagnosis not present

## 2024-02-13 DIAGNOSIS — G4700 Insomnia, unspecified: Secondary | ICD-10-CM | POA: Diagnosis not present

## 2024-02-13 DIAGNOSIS — N390 Urinary tract infection, site not specified: Secondary | ICD-10-CM | POA: Diagnosis not present

## 2024-02-13 DIAGNOSIS — K219 Gastro-esophageal reflux disease without esophagitis: Secondary | ICD-10-CM | POA: Diagnosis not present

## 2024-02-18 ENCOUNTER — Telehealth: Payer: Self-pay

## 2024-02-18 DIAGNOSIS — J479 Bronchiectasis, uncomplicated: Secondary | ICD-10-CM | POA: Diagnosis not present

## 2024-02-18 DIAGNOSIS — D649 Anemia, unspecified: Secondary | ICD-10-CM | POA: Diagnosis not present

## 2024-02-18 DIAGNOSIS — L821 Other seborrheic keratosis: Secondary | ICD-10-CM | POA: Diagnosis not present

## 2024-02-18 DIAGNOSIS — Z853 Personal history of malignant neoplasm of breast: Secondary | ICD-10-CM

## 2024-02-18 DIAGNOSIS — F410 Panic disorder [episodic paroxysmal anxiety] without agoraphobia: Secondary | ICD-10-CM | POA: Diagnosis not present

## 2024-02-18 DIAGNOSIS — G4700 Insomnia, unspecified: Secondary | ICD-10-CM | POA: Diagnosis not present

## 2024-02-18 DIAGNOSIS — K219 Gastro-esophageal reflux disease without esophagitis: Secondary | ICD-10-CM | POA: Diagnosis not present

## 2024-02-18 DIAGNOSIS — M4004 Postural kyphosis, thoracic region: Secondary | ICD-10-CM | POA: Diagnosis not present

## 2024-02-18 DIAGNOSIS — L820 Inflamed seborrheic keratosis: Secondary | ICD-10-CM | POA: Diagnosis not present

## 2024-02-18 DIAGNOSIS — K9189 Other postprocedural complications and disorders of digestive system: Secondary | ICD-10-CM | POA: Diagnosis not present

## 2024-02-18 DIAGNOSIS — K5981 Ogilvie syndrome: Secondary | ICD-10-CM | POA: Diagnosis not present

## 2024-02-18 DIAGNOSIS — Z981 Arthrodesis status: Secondary | ICD-10-CM

## 2024-02-18 DIAGNOSIS — Z129 Encounter for screening for malignant neoplasm, site unspecified: Secondary | ICD-10-CM | POA: Diagnosis not present

## 2024-02-18 DIAGNOSIS — N390 Urinary tract infection, site not specified: Secondary | ICD-10-CM | POA: Diagnosis not present

## 2024-02-18 DIAGNOSIS — Z87891 Personal history of nicotine dependence: Secondary | ICD-10-CM

## 2024-02-18 NOTE — Telephone Encounter (Signed)
 Plan of care signed and faxed back to [facility] at [phone]. Form sent for scanning.

## 2024-02-22 ENCOUNTER — Ambulatory Visit: Admitting: Obstetrics and Gynecology

## 2024-02-22 ENCOUNTER — Encounter: Payer: Self-pay | Admitting: Obstetrics and Gynecology

## 2024-02-22 VITALS — BP 112/74 | HR 69

## 2024-02-22 DIAGNOSIS — N390 Urinary tract infection, site not specified: Secondary | ICD-10-CM

## 2024-02-22 DIAGNOSIS — N952 Postmenopausal atrophic vaginitis: Secondary | ICD-10-CM

## 2024-02-22 DIAGNOSIS — Z87440 Personal history of urinary (tract) infections: Secondary | ICD-10-CM

## 2024-02-22 MED ORDER — ESTRADIOL 0.1 MG/GM VA CREA: 0.5000 g | TOPICAL_CREAM | VAGINAL | 11 refills | Status: DC

## 2024-02-22 NOTE — Progress Notes (Signed)
 [facility] Return Visit  SUBJECTIVE  History of Present Illness: Cynthia Thornton is a 78 y.o. female seen in follow-up for rUTI. Plan at last visit was do another 6 months of prophylaxis and then go down to just Methanamine for prophylaxis. Patient reports she did have 1 UTI when she was hospitalized for almost a month related to her spinal fusion surgery.     Past Medical History: Patient  has a past medical history of Annual physical exam (07/27/2015), Anxiety (03/29/2019), Arthritis, Breast cancer, left [facility]) (oncologist-- dr Lee Public), Bronchiectasis [facility]), Change in bowel habits (09/10/2014), COLONIC POLYPS, BENIGN (01/16/2008), DEGENERATIVE JOINT DISEASE (01/16/2008), Dyslipidemia, Eczema, ESOPHAGEAL STRICTURE (04/05/2007), Family history of breast cancer, Family hx of colon cancer (09/10/2014), Genetic testing (03/03/2016), GERD (07/30/2006), GOITER, MULTINODULAR (11/27/2008), HIATAL HERNIA (08/15/2006), History of esophageal stricture, History of external beam radiation therapy, History of hyperthyroidism, Hypercholesteremia (10/03/2010), Insomnia, Irritation of left eye (02/07/2022), Malignant neoplasm of lower-inner quadrant of left breast in female, estrogen receptor positive (HCC) (10/02/2017), Osteopenia (02/2018), Pityriasis rosea, PONV (postoperative nausea and vomiting), Primary osteoarthritis of left knee, Rhomboid pain, Smoking history (01/15/2015), Thyrotoxicosis (10/22/2008), Tinnitus (06/27/2019), UNSPECIFIED ANEMIA (11/02/2008), and Wears contact lenses.   Past Surgical History: She  has a past surgical history that includes Video bronchoscopy (Bilateral, 03/16/2015); Breast lumpectomy with radioactive seed and sentinel lymph node biopsy (Left, 11/02/2017); Breast surgery (2000); Vaginal hysterectomy (1991); Tonsillectomy (age 64); Tubal ligation (Bilateral, yrs ago); and Laparoscopic bilateral salpingo oophorectomy (Bilateral, 10/13/2019).   Medications: She has a  current medication list which includes the following prescription(s): acetaminophen , calcium  carbonate, clorazepate , [START ON 02/25/2024] estradiol , famotidine , methenamine , nitrofurantoin  (macrocrystal-monohydrate), omeprazole , ondansetron , probiotic product, and valacyclovir .   Allergies: Patient is allergic to doxycycline , other, oxycodone , sulfa  antibiotics, trimethoprim  sulfate [trimethoprim ], codeine, and propranolol hcl.   Social History: Patient  reports that she quit smoking about 28 years ago. Her smoking use included cigarettes. She started smoking about 58 years ago. She has a 3 pack-year smoking history. She has never used smokeless tobacco. She reports current alcohol use. She reports that she does not use drugs.     OBJECTIVE     Physical Exam: Vitals:   02/22/24 0832  BP: 112/74  Pulse: 69   Gen: No apparent distress, A&O x 3.  Detailed Urogynecologic Evaluation:  Deferred.    ASSESSMENT AND PLAN    Ms. Mailloux is a 78 y.o. with:  1. Recurrent urinary tract infection   2. Vaginal atrophy    Patient to stop antibiotic prophylaxis and continue Methenamine  x2 daily.  We also discussed using estrogen cream x2 weekly around the urethra for support of UTI prevention. She is hesitant as she has had breast cancer, but we discussed it does not go systemically and is considered safe in small amounts for breast cancer patients.   Patient to return in 6 months or sooner if needed. If she is doing well on her prophylaxis can do a 1 year follow up.   Flint Hakeem G Nazaret Chea, NP

## 2024-02-22 NOTE — Patient Instructions (Addendum)
 Please continue Methenamine . Please call and let us  know if you have UTI symptoms and we will get you in for a sample.  Please consider using some estrogen cream around the clitoris and urethra for UTI protection twice weekly (Monday and Friday). Use your finger and put a peas sized amount.

## 2024-02-27 ENCOUNTER — Other Ambulatory Visit: Payer: Self-pay | Admitting: Physician Assistant

## 2024-03-05 DIAGNOSIS — S82001A Unspecified fracture of right patella, initial encounter for closed fracture: Secondary | ICD-10-CM | POA: Diagnosis not present

## 2024-03-07 DIAGNOSIS — M4325 Fusion of spine, thoracolumbar region: Secondary | ICD-10-CM | POA: Diagnosis not present

## 2024-03-07 DIAGNOSIS — Z4789 Encounter for other orthopedic aftercare: Secondary | ICD-10-CM | POA: Diagnosis not present

## 2024-03-13 ENCOUNTER — Other Ambulatory Visit: Payer: Self-pay | Admitting: Physician Assistant

## 2024-03-17 ENCOUNTER — Encounter: Payer: Medicare PPO | Admitting: Internal Medicine

## 2024-03-19 DIAGNOSIS — H2513 Age-related nuclear cataract, bilateral: Secondary | ICD-10-CM | POA: Diagnosis not present

## 2024-03-19 DIAGNOSIS — H52203 Unspecified astigmatism, bilateral: Secondary | ICD-10-CM | POA: Diagnosis not present

## 2024-03-21 ENCOUNTER — Encounter: Payer: Medicare PPO | Admitting: Internal Medicine

## 2024-04-03 ENCOUNTER — Telehealth: Payer: Self-pay

## 2024-04-03 DIAGNOSIS — M810 Age-related osteoporosis without current pathological fracture: Secondary | ICD-10-CM

## 2024-04-03 NOTE — Telephone Encounter (Signed)
-----   Message from Reinaldo Caras sent at 04/03/2024  3:53 PM EDT ----- Regarding: RE: external DEXA needed Yes please Dr. Tia Flowers ----- Message ----- From: Lindajo Resides, CMA Sent: 04/03/2024   2:28 PM EDT To: Reinaldo Caras, MD Subject: FW: external DEXA needed                       Not been seen in office since 2023. Possibly due to low risk MCR.  Ok to place order for DXA? She is scheduled to see you on 05/16/2024 ----- Message ----- From: Dal Dubin Sent: 04/03/2024   2:04 PM EDT To: [facility] Triage Subject: external DEXA needed

## 2024-04-03 NOTE — Telephone Encounter (Signed)
 Order placed and pt provided information to contact [facility] for appt.  Pt voiced understanding. Encounter closed.

## 2024-04-11 ENCOUNTER — Ambulatory Visit: Admitting: Gastroenterology

## 2024-04-11 DIAGNOSIS — Z8249 Family history of ischemic heart disease and other diseases of the circulatory system: Secondary | ICD-10-CM | POA: Diagnosis not present

## 2024-04-11 DIAGNOSIS — E785 Hyperlipidemia, unspecified: Secondary | ICD-10-CM | POA: Diagnosis not present

## 2024-04-18 ENCOUNTER — Telehealth: Payer: Self-pay | Admitting: Internal Medicine

## 2024-04-18 DIAGNOSIS — Z981 Arthrodesis status: Secondary | ICD-10-CM | POA: Diagnosis not present

## 2024-04-18 DIAGNOSIS — Z4789 Encounter for other orthopedic aftercare: Secondary | ICD-10-CM | POA: Diagnosis not present

## 2024-04-18 DIAGNOSIS — F419 Anxiety disorder, unspecified: Secondary | ICD-10-CM

## 2024-04-18 NOTE — Telephone Encounter (Signed)
 PDMP okay, Rx sent

## 2024-04-18 NOTE — Telephone Encounter (Signed)
 Requesting: clorazepate  7.5mg   Contract: 11/12/23 UDS: 11/12/23 Last Visit: 01/23/24 Next Visit: 05/23/24 Last Refill: 12/28/23 #30 and 1RF   Please Advise

## 2024-04-25 DIAGNOSIS — M5459 Other low back pain: Secondary | ICD-10-CM | POA: Diagnosis not present

## 2024-04-25 DIAGNOSIS — M4310 Spondylolisthesis, site unspecified: Secondary | ICD-10-CM | POA: Diagnosis not present

## 2024-04-25 DIAGNOSIS — M4325 Fusion of spine, thoracolumbar region: Secondary | ICD-10-CM | POA: Diagnosis not present

## 2024-05-08 NOTE — Progress Notes (Signed)
 Ellouise Console, PA-C [address] [place], [place]  [address] Phone: [phone]   Primary Care Physician: Amon Aloysius BRAVO, MD  Primary Gastroenterologist:  Ellouise Console, PA-C / Dr. Gordy Starch   Chief Complaint:  Med Refill; follow-up GERD; Repeat Colon   HPI:   Cynthia Thornton is a 78 y.o. female returns for follow-up of GERD.   Current symptoms: Patient is taking omeprazole  40 Mg once daily with good control of acid reflux.  She has been out of Pepcid  for the past 2 months and is doing well off Pepcid .  She needs refill of omeprazole .  If she misses her omeprazole  for 3 or 4 days, then she has moderate increased acid reflux.  She has remote history of distal esophageal stricture.  She has not had any recurrent solid food dysphagia in many years since her last EGD in 2013.  She denies any current GI symptoms.  Remote history of IBS and constipation.  She is not having any IBS constipation symptoms at present.  She has family history of her father who had colon cancer in his 35s.  Personal history of adenomatous colon polyp in 2016.  No colon polyps during her last colonoscopy 04/2019.  She is due for 5-year repeat colonoscopy due to family history of colon cancer in first-degree relative.  PMH: hiatal hernia with GERD, history of esophageal stricture, personal history of colon polyps with family history of colon cancer in her father and uncle in 31s, bronchiectasis, anemia, left breast carcinoma 2018, IBS.    GI History: 09/18/2012 EGD was normal.  05/29/2019 colonoscopy with small internal hemorrhoids and otherwise normal.  Repeat recommended in 5 years given family history (due 04/2024).    10/2014 colonoscopy by Dr. Starch: 4 small polyps removed: 1 polyp was sessile serrated polyp.  01/29/23 CT abdomen pelvis with contrast for abdominal pain showed no acute intra-abdominal or pelvic pathology, moderate stool burden no bowel obstruction, normal appendix.  Given samples of Linzess 72  versus MiraLAX with Benefiber.  Current Outpatient Medications  Medication Sig Dispense Refill   acetaminophen  (TYLENOL ) 500 MG tablet Take 1,000 mg by mouth every 8 (eight) hours as needed.     calcium  carbonate (TUMS EX) 750 MG chewable tablet Chew 2 tablets by mouth every 8 (eight) hours as needed for heartburn.     clorazepate  (TRANXENE ) 7.5 MG tablet TAKE 1 TABLET (7.5 MG TOTAL) BY MOUTH AT BEDTIME AS NEEDED FOR ANXIETY. 30 tablet 1   estradiol  (ESTRACE ) 0.1 MG/GM vaginal cream Place 0.5 g vaginally 2 (two) times a week. Place 0.5g nightly twice a week 42.5 g 11   gabapentin  (NEURONTIN ) 300 MG capsule Take by mouth.     methenamine  (HIPREX ) 1 g tablet Take 1 g by mouth 2 (two) times daily.     nitrofurantoin , macrocrystal-monohydrate, (MACROBID ) 100 MG capsule Take 1 capsule (100 mg total) by mouth daily. 90 capsule 1   omeprazole  (PRILOSEC) 40 MG capsule Take 1 capsule (40 mg total) by mouth daily. Patient needs follow up appointment for future refills. Please call [phone] to schedule an appointment. 90 capsule 0   ondansetron  (ZOFRAN -ODT) 4 MG disintegrating tablet TAKE 1 TABLET BY MOUTH EVERY 8 HOURS AS NEEDED FOR NAUSEA OR VOMITING 30 tablet 0   Probiotic Product (PROBIOTIC PO) Take 1 tablet by mouth daily.     traMADol  (ULTRAM ) 50 MG tablet Take 50 mg by mouth every 6 (six) hours as needed.     valACYclovir  (  VALTREX ) 500 MG tablet TAKE 1 TABLET (500 MG TOTAL) BY MOUTH DAILY. 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 90 tablet 4   famotidine  (PEPCID ) 20 MG tablet Take 20 mg by mouth 2 (two) times daily. (Patient not taking: Reported on 05/09/2024)     No current facility-administered medications for this visit.    Allergies as of 05/09/2024 - Review Complete 05/09/2024  Allergen Reaction Noted   Doxycycline  Rash 05/19/2019   Other Other (See Comments) 04/23/2020   Oxycodone  Nausea And Vomiting 05/05/2019   Sulfa  antibiotics Hives 04/23/2020   Trimethoprim  sulfate [trimethoprim ]  11/05/2019    Codeine Nausea Only 08/31/2011   Propranolol hcl Other (See Comments)     Past Medical History:  Diagnosis Date   Annual physical exam 07/27/2015   Anxiety 03/29/2019   Arthritis    Breast cancer, left [facility]) oncologist-- dr odean   dx 09-26-2017--- IDC, Stage IA, Grade 2, ER/PR +;  HER2 negative;  s/p  breast lumpectomy w/ node dissection 11-02-2017;  completed radiation 01-09-2018 (left breast genetic screening panel 2017 with variant of unknown significance AXINA)   Bronchiectasis (HCC)    pulmologist-- dr rosiland-- w/ chronic lingular scarring  (last exceratbation bronchiectasis 07/ 2020)   Change in bowel habits 09/10/2014   COLONIC POLYPS, BENIGN 01/16/2008   Qualifier: Diagnosis of   By: Lewellyn CMA (AAMA), Amanda         DEGENERATIVE JOINT DISEASE 01/16/2008   Qualifier: Diagnosis of   By: Lewellyn CMA (AAMA), Amanda      Replacing diagnoses that were inactivated after the 01/29/23 regulatory import     Dyslipidemia    followed by cardiology/ vascular-- dr g. adams ([facility] in [place])  family history strokes   Eczema    ESOPHAGEAL STRICTURE 04/05/2007   Annotation: per EGD  Qualifier: Diagnosis of   By: Amon MD, Aloysius BRAVO.        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  The Comprehensive Cancer Panel offered by [facility] includes sequencing and/or deletion duplication testing of the following 32 genes: APC, ATM, AXIN2, BARD1, BMPR1A, BRCA1, BRCA2, BRIP1, CDH1, CDK4, CDKN2A, CHEK2, EPCAM, FANCC, MLH1, MSH2, MSH6, MUTYH, NBN, PALB2, PMS2, POLD1, POLE, PTEN, RAD51C, RAD51D, SCG5/GREM1, SMAD4, STK1   GERD 07/30/2006   Qualifier: Diagnosis of   By: Amon MD, Aloysius Cynthia Thornton, MULTINODULAR 11/27/2008   Qualifier: Diagnosis of   By: Kassie MD, Sean A        HIATAL HERNIA 08/15/2006   Qualifier: Diagnosis of   By: Earlean CMA (AAMA),  Amanda      Replacing diagnoses that were inactivated after the 01/29/23 regulatory import     History of esophageal stricture    s/p dilatation 2008   History of external beam radiation therapy    left breast 12-19-2017  to 01-09-2018   History of hyperthyroidism    endocrinologist-- (lov 04-11-2019 epic) dr kassie, dx 2009 due to multinodular goiter ,  had taken medication for few months 2010 stopped due to norma TFT;     Hypercholesteremia 10/03/2010   Overview:   Chronic [Hypercholesterolemia]     Insomnia    Irritation of left eye 02/07/2022   Malignant neoplasm of lower-inner quadrant of left breast in female, estrogen receptor positive (HCC) 10/02/2017   Osteopenia 02/2018   T score -2.4 distal  third of radius.  FRAX 11% / 1.8% stable at other points of interest to include spine, right and left hip   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   Smoking history 01/15/2015   Thyrotoxicosis 10/22/2008   Qualifier: Diagnosis of   By: Tish MD, William         Tinnitus 06/27/2019   UNSPECIFIED ANEMIA 11/02/2008   Qualifier: Diagnosis of   By: Tish MD, Elsie         Wears contact lenses     Past Surgical History:  Procedure Laterality Date   BREAST LUMPECTOMY WITH RADIOACTIVE SEED AND SENTINEL LYMPH NODE BIOPSY Left 11/02/2017   Procedure: LEFT BREAST LUMPECTOMY WITH RADIOACTIVE SEED AND LEFT AXILLARY DEEP SENTINEL LYMPH NODE BIOPSY, INJECT BLUE DYE LEFT BREAST;  Surgeon: Gail Favorite, MD;  Location: [facility];  Service: General;  Laterality: Left;   BREAST SURGERY  2000   Breast cyst removed, left   LAPAROSCOPIC BILATERAL SALPINGO OOPHERECTOMY Bilateral 10/13/2019   Procedure: LAPAROSCOPIC BILATERAL SALPINGO OOPHORECTOMY LYSIS OF ADHESIONS PERITONEAL WASHINGS ;  Surgeon:  Lavoie, Marie-Lyne, MD;  Location: [facility] [place];  Service: Gynecology;  Laterality: Bilateral;  request 7:30am OR time in [place] [facility] block requests one hour   TONSILLECTOMY  age 59   TUBAL LIGATION Bilateral yrs ago   VAGINAL HYSTERECTOMY  1991   Right ovarian cystectomy   VIDEO BRONCHOSCOPY Bilateral 03/16/2015   Procedure: VIDEO BRONCHOSCOPY WITHOUT FLUORO;  Surgeon: Dorethia Cave, MD;  Location: [facility];  Service: Endoscopy;  Laterality: Bilateral;    Review of Systems:    All systems reviewed and negative except where noted in HPI.    Physical Exam:  BP 98/62   Pulse 73   Ht 5' 2.5 (1.588 m)   Wt 143 lb (64.9 kg)   BMI 25.74 kg/m  No LMP recorded. Patient has had a hysterectomy.  General: Well-nourished, well-developed in no acute distress.  Lungs: Clear to auscultation bilaterally. Non-labored. Heart: Regular rate and rhythm, no murmurs rubs or gallops.  Abdomen: Bowel sounds are normal; Abdomen is Soft; No hepatosplenomegaly, masses or hernias;  No Abdominal Tenderness; No guarding or rebound tenderness. Neuro: Alert and oriented x 3.  Grossly intact.  Psych: Alert and cooperative, normal mood and affect.   Imaging Studies: No results found.  Labs: CBC    Component Value Date/Time   WBC 6.8 01/23/2024 1129   RBC 4.07 01/23/2024 1129   HGB 12.8 01/23/2024 1129   HGB 13.0 10/03/2017 0837   HCT 38.7 01/23/2024 1129   HCT 37.6 10/03/2017 0837   PLT 364.0 01/23/2024 1129   PLT 305 10/03/2017 0837   MCV 94.9 01/23/2024 1129   MCV 88.3 10/03/2017 0837   MCH 30.2 08/13/2020 0957   MCHC 33.1 01/23/2024 1129   RDW 13.8 01/23/2024 1129   RDW 13.4 10/03/2017 0837   LYMPHSABS 1.2 01/23/2024 1129   LYMPHSABS 1.5 10/03/2017 0837   MONOABS 0.5 01/23/2024 1129   MONOABS 0.5 10/03/2017 0837   EOSABS 0.3 01/23/2024 1129   EOSABS 0.2 10/03/2017 0837   BASOSABS 0.1 01/23/2024 1129   BASOSABS 0.1 10/03/2017 0837    CMP     Component Value  Date/Time   NA 136 01/23/2024 1129   NA 136 (A) 10/09/2022 0000  NA 134 (L) 10/03/2017 0837   K 4.0 01/23/2024 1129   K 4.3 10/03/2017 0837   CL 100 01/23/2024 1129   CO2 25 01/23/2024 1129   CO2 23 10/03/2017 0837   GLUCOSE 95 01/23/2024 1129   GLUCOSE 90 10/03/2017 0837   BUN 11 01/23/2024 1129   BUN 16 10/09/2022 0000   BUN 24.3 10/03/2017 0837   CREATININE 0.74 01/23/2024 1129   CREATININE 0.93 08/13/2020 0957   CREATININE 1.0 10/03/2017 0837   CALCIUM  9.9 01/23/2024 1129   CALCIUM  9.7 10/03/2017 0837   PROT 6.7 11/12/2023 1515   PROT 7.7 10/03/2017 0837   ALBUMIN 4.6 11/12/2023 1515   ALBUMIN 4.7 10/03/2017 0837   AST 20 11/12/2023 1515   AST 21 10/03/2017 0837   ALT 19 11/12/2023 1515   ALT 17 10/03/2017 0837   ALKPHOS 76 11/12/2023 1515   ALKPHOS 107 10/03/2017 0837   BILITOT 0.4 11/12/2023 1515   BILITOT 0.44 10/03/2017 0837   GFRNONAA >60 05/18/2020 1147   GFRAA >60 05/18/2020 1147    Assessment and Plan:   Cynthia Thornton is a 78 y.o. y/o female returns for follow-up of:  1.  GERD: Controlled on Omeprazole  40mg  daily. - Continue omeprazole  40 Mg daily, #90, 3 refills - Stop Pepcid  20 Mg daily - Not needed. - GERD diet handout given.  2.  Irritable bowel syndrome - Assymptomatic; Doing well.  3.  History of colon polyps - Scheduling 5-year repeat colonoscopy, currently due I discussed risks of colonoscopy with patient to include risk of bleeding, colon perforation, and risk of sedation.  Patient expressed understanding and agrees to proceed with colonoscopy.   4.  Family history of colon cancer: Father age 54s - Scheduling 5-year repeat colonoscopy  Ellouise Console, PA-C  Follow up 1 year for GERD.  Sooner if she has any recurrent GI symptoms.

## 2024-05-09 ENCOUNTER — Encounter: Payer: Self-pay | Admitting: Physician Assistant

## 2024-05-09 ENCOUNTER — Ambulatory Visit: Payer: Medicare PPO | Admitting: Obstetrics and Gynecology

## 2024-05-09 ENCOUNTER — Ambulatory Visit: Admitting: Physician Assistant

## 2024-05-09 VITALS — BP 98/62 | HR 73 | Ht 62.5 in | Wt 143.0 lb

## 2024-05-09 DIAGNOSIS — Z800 Family history of malignant neoplasm of digestive organs: Secondary | ICD-10-CM | POA: Diagnosis not present

## 2024-05-09 DIAGNOSIS — K589 Irritable bowel syndrome without diarrhea: Secondary | ICD-10-CM

## 2024-05-09 DIAGNOSIS — K219 Gastro-esophageal reflux disease without esophagitis: Secondary | ICD-10-CM | POA: Diagnosis not present

## 2024-05-09 DIAGNOSIS — Z860100 Personal history of colon polyps, unspecified: Secondary | ICD-10-CM

## 2024-05-09 MED ORDER — OMEPRAZOLE 40 MG PO CPDR: 40.0000 mg | DELAYED_RELEASE_CAPSULE | Freq: Every day | ORAL | 3 refills | Status: AC

## 2024-05-09 MED ORDER — NA SULFATE-K SULFATE-MG SULF 17.5-3.13-1.6 GM/177ML PO SOLN: 1.0000 | Freq: Once | ORAL | 0 refills | Status: AC

## 2024-05-09 NOTE — Patient Instructions (Signed)
 We have sent the following medications to your pharmacy for you to pick up at your convenience: Omeprazole  40 mg once daily  You have been scheduled for a Colonoscopy. Please follow written instructions given to you at your visit today.   If you use inhalers (even only as needed), please bring them with you on the day of your procedure.  DO NOT TAKE 7 DAYS PRIOR TO TEST- Trulicity (dulaglutide) Ozempic, Wegovy (semaglutide) Mounjaro (tirzepatide) Bydureon Bcise (exanatide extended release)  DO NOT TAKE 1 DAY PRIOR TO YOUR TEST Rybelsus (semaglutide) Adlyxin (lixisenatide) Victoza (liraglutide) Byetta (exanatide) ___________________________________________________________________________  Please follow up sooner if symptoms increase or worsen   Due to recent changes in healthcare laws, you may see the results of your imaging and laboratory studies on MyChart before your provider has had a chance to review them.  We understand that in some cases there may be results that are confusing or concerning to you. Not all laboratory results come back in the same time frame and the provider may be waiting for multiple results in order to interpret others.  Please give us  48 hours in order for your provider to thoroughly review all the results before contacting the office for clarification of your results.   Thank you for trusting me with your gastrointestinal care!   Ellouise Console, PA-C _______________________________________________________  If your blood pressure at your visit was 140/90 or greater, please contact your primary care physician to follow up on this.  _______________________________________________________  If you are age 76 or older, your body mass index should be between 23-30. Your Body mass index is 25.74 kg/m. If this is out of the aforementioned range listed, please consider follow up with your Primary Care Provider.  If you are age 76 or younger, your body mass index  should be between 19-25. Your Body mass index is 25.74 kg/m. If this is out of the aformentioned range listed, please consider follow up with your Primary Care Provider.   ________________________________________________________  The [facility] providers would like to encourage you to use MYCHART to communicate with providers for non-urgent requests or questions.  Due to long hold times on the telephone, sending your provider a message by [facility] may be a faster and more efficient way to get a response.  Please allow 48 business hours for a response.  Please remember that this is for non-urgent requests.  _______________________________________________________

## 2024-05-16 ENCOUNTER — Other Ambulatory Visit (HOSPITAL_COMMUNITY)
Admission: RE | Admit: 2024-05-16 | Discharge: 2024-05-16 | Disposition: A | Discharge status: Home / Self Care | Source: Ambulatory Visit | Attending: Obstetrics and Gynecology | Admitting: Obstetrics and Gynecology

## 2024-05-16 ENCOUNTER — Encounter: Payer: Self-pay | Admitting: Obstetrics and Gynecology

## 2024-05-16 ENCOUNTER — Ambulatory Visit (INDEPENDENT_AMBULATORY_CARE_PROVIDER_SITE_OTHER): Admitting: Obstetrics and Gynecology

## 2024-05-16 VITALS — BP 116/78 | HR 81 | Ht 64.0 in | Wt 141.0 lb

## 2024-05-16 DIAGNOSIS — N952 Postmenopausal atrophic vaginitis: Secondary | ICD-10-CM

## 2024-05-16 DIAGNOSIS — Z01419 Encounter for gynecological examination (general) (routine) without abnormal findings: Secondary | ICD-10-CM | POA: Diagnosis not present

## 2024-05-16 DIAGNOSIS — Z124 Encounter for screening for malignant neoplasm of cervix: Secondary | ICD-10-CM

## 2024-05-16 DIAGNOSIS — T148XXA Other injury of unspecified body region, initial encounter: Secondary | ICD-10-CM

## 2024-05-16 DIAGNOSIS — M8000XA Age-related osteoporosis with current pathological fracture, unspecified site, initial encounter for fracture: Secondary | ICD-10-CM | POA: Diagnosis not present

## 2024-05-16 DIAGNOSIS — C50312 Malignant neoplasm of lower-inner quadrant of left female breast: Secondary | ICD-10-CM | POA: Diagnosis not present

## 2024-05-16 DIAGNOSIS — Z9289 Personal history of other medical treatment: Secondary | ICD-10-CM

## 2024-05-16 DIAGNOSIS — B009 Herpesviral infection, unspecified: Secondary | ICD-10-CM

## 2024-05-16 NOTE — Progress Notes (Signed)
 78 y.o. y.o. female here for annual medicare exam. No LMP recorded. Patient has had a hysterectomy.  Hysterectomy for anemia and bleeding. 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   HPI:  S/P Total Hysterectomy and then 09/2019 BSO.  Abstinent.  Pap Neg 08/2011.    H/O left Breast Ca, carrier of a  variant of unknown significant AXIN2. Breasts normal.  Mammo Neg 09/21/21.  Urine normal.  BMI 25.53.  Health labs with Dr Amon.  Colono 04/2019. Repeat q 5 years- dad with colon cancer. Has scheduled for Sept  BD 04/2020 Osteoporosis T-Score -3.0 at Lt total forearm.  06/13/22 dxa RFN -2.2 positive frax 3.9%, reports fall 2024 with fracture of left wrist. Referral for Evenity/prolia. Counseled on r/b/a/I. Repeat bone scan 8/21 but needs treatment/bone builder sooner rather than later. Patient agrees, PA sent to [facility]. Vit D, Ca++.  Body mass index is 24.2 kg/m.     01/23/2024   10:54 AM 11/12/2023    2:56 PM 03/16/2023    7:58 AM  Depression screen PHQ 2/9  Decreased Interest 0 0 0  Down, Depressed, Hopeless 0 0 0  PHQ - 2 Score 0 0 0    Blood pressure 116/78, pulse 81, height 5' 4 (1.626 m), weight 141 lb (64 kg), SpO2 97%.  No results found for: DIAGPAP, HPVHIGH, ADEQPAP  GYN HISTORY: No results found for: DIAGPAP, HPVHIGH, ADEQPAP  OB History  Gravida Para Term Preterm AB Living  3 2 2  1 2   SAB IAB Ectopic Multiple Live Births  1        # Outcome Date GA Lbr Len/2nd Weight Sex Type Anes PTL Lv  3 SAB           2 Term      Vag-Spont     1 Term      Vag-Spont       Past Medical History:  Diagnosis Date   Annual physical exam 07/27/2015   Anxiety 03/29/2019   Arthritis    Breast cancer, left [facility]) oncologist-- dr odean   dx 09-26-2017--- IDC, Stage IA, Grade 2,  ER/PR +;  HER2 negative;  s/p  breast lumpectomy w/ node dissection 11-02-2017;  completed radiation 01-09-2018 (left breast genetic screening panel 2017 with variant of unknown significance AXINA)   Bronchiectasis (HCC)    pulmologist-- dr rosiland-- w/ chronic lingular scarring  (last exceratbation bronchiectasis 07/ 2020)   Change in bowel habits 09/10/2014   COLONIC POLYPS, BENIGN 01/16/2008   Qualifier: Diagnosis of   By: Lewellyn CMA (AAMA), Amanda         DEGENERATIVE JOINT DISEASE 01/16/2008   Qualifier: Diagnosis of   By: Lewellyn CMA (AAMA), Amanda      Replacing diagnoses that were inactivated after the 01/29/23 regulatory import     Dyslipidemia    followed by cardiology/ vascular-- dr g. adams ([facility] in [place])  family history strokes   Eczema    ESOPHAGEAL STRICTURE 04/05/2007   Annotation: per EGD  Qualifier: Diagnosis of   By: Amon MD, Aloysius BRAVO.        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  The Comprehensive Cancer Panel offered by [facility] includes sequencing and/or deletion duplication testing of the following 32 genes: APC, ATM, AXIN2, BARD1, BMPR1A, BRCA1, BRCA2, BRIP1, CDH1, CDK4, CDKN2A, CHEK2, EPCAM, FANCC, MLH1, MSH2, MSH6, MUTYH, NBN, PALB2, PMS2, POLD1, POLE, PTEN, RAD51C, RAD51D, SCG5/GREM1, SMAD4, STK1   GERD 07/30/2006   Qualifier: Diagnosis of   By: Amon MD, Aloysius CHARLENA SALIVA, MULTINODULAR 11/27/2008   Qualifier: Diagnosis of   By: Kassie MD, Sean A        HIATAL HERNIA 08/15/2006   Qualifier: Diagnosis of   By: Earlean CMA (AAMA), Amanda      Replacing diagnoses that were inactivated after the 01/29/23 regulatory import     History of esophageal stricture    s/p dilatation 2008   History of external beam radiation therapy    left breast 12-19-2017  to 01-09-2018    History of hyperthyroidism    endocrinologist-- (lov 04-11-2019 epic) dr kassie, dx 2009 due to multinodular goiter ,  had taken medication for few months 2010 stopped due to norma TFT;     Hypercholesteremia 10/03/2010   Overview:   Chronic [Hypercholesterolemia]     Insomnia    Irritation of left eye 02/07/2022   Malignant neoplasm of lower-inner quadrant of left breast in female, estrogen receptor positive (HCC) 10/02/2017   Osteopenia 02/2018   T score -2.4 distal third of radius.  FRAX 11% / 1.8% stable at other points of interest to include spine, right and left hip   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   Smoking history 01/15/2015   Thyrotoxicosis 10/22/2008   Qualifier: Diagnosis of   By: Tish MD, William         Tinnitus 06/27/2019   UNSPECIFIED ANEMIA 11/02/2008   Qualifier: Diagnosis of   By: Tish MD, Elsie         Wears contact lenses     Past Surgical History:  Procedure Laterality Date   BREAST LUMPECTOMY WITH RADIOACTIVE SEED AND SENTINEL LYMPH NODE BIOPSY Left 11/02/2017   Procedure: LEFT BREAST LUMPECTOMY WITH RADIOACTIVE SEED AND LEFT AXILLARY DEEP SENTINEL LYMPH NODE BIOPSY, INJECT BLUE DYE LEFT BREAST;  Surgeon: Gail Favorite, MD;  Location: [facility];  Service: General;  Laterality: Left;   BREAST SURGERY  2000   Breast cyst removed, left   LAPAROSCOPIC BILATERAL SALPINGO OOPHERECTOMY Bilateral 10/13/2019   Procedure: LAPAROSCOPIC BILATERAL SALPINGO OOPHORECTOMY LYSIS OF ADHESIONS PERITONEAL WASHINGS ;  Surgeon: Lavoie, Marie-Lyne, MD;  Location: [facility];  Service: Gynecology;  Laterality: Bilateral;  request 7:30am OR time in [place] [facility] block requests one hour   TONSILLECTOMY  age 47   TUBAL LIGATION Bilateral yrs ago    VAGINAL HYSTERECTOMY  1991   Right ovarian cystectomy   VIDEO BRONCHOSCOPY Bilateral 03/16/2015   Procedure: VIDEO BRONCHOSCOPY WITHOUT FLUORO;  Surgeon: Dorethia Cave, MD;  [facility];  Service: Endoscopy;  Laterality: Bilateral;    Current Outpatient Medications on File Prior to Visit  Medication Sig Dispense Refill   acetaminophen  (TYLENOL ) 500 MG tablet Take 1,000 mg by mouth every 8 (eight) hours as needed.     clorazepate  (TRANXENE ) 7.5 MG tablet TAKE 1 TABLET (7.5 MG  TOTAL) BY MOUTH AT BEDTIME AS NEEDED FOR ANXIETY. 30 tablet 1   estradiol  (ESTRACE ) 0.1 MG/GM vaginal cream Place 0.5 g vaginally 2 (two) times a week. Place 0.5g nightly twice a week 42.5 g 11   gabapentin  (NEURONTIN ) 300 MG capsule Take by mouth.     methenamine  (HIPREX ) 1 g tablet Take 1 g by mouth 2 (two) times daily.     omeprazole  (PRILOSEC) 40 MG capsule Take 1 capsule (40 mg total) by mouth daily. Patient needs follow up appointment for future refills. Please call [phone] to schedule an appointment. 90 capsule 3   ondansetron  (ZOFRAN -ODT) 4 MG disintegrating tablet TAKE 1 TABLET BY MOUTH EVERY 8 HOURS AS NEEDED FOR NAUSEA OR VOMITING 30 tablet 0   Probiotic Product (PROBIOTIC PO) Take 1 tablet by mouth daily.     traMADol  (ULTRAM ) 50 MG tablet Take 50 mg by mouth every 6 (six) hours as needed.     valACYclovir  (VALTREX ) 500 MG tablet TAKE 1 TABLET (500 MG TOTAL) BY MOUTH DAILY. 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 90 tablet 4   No current facility-administered medications on file prior to visit.    Social History   Socioeconomic History   Marital status: Single    Spouse name: Not on file   Number of children: 2   Years of education: Not on file   Highest education level: Master's degree (e.g., MA, MS, MEng, MEd, MSW, MBA)  Occupational History   Occupation: Runner, broadcasting/film/video, works part time     Associate Professor: [employer]  Tobacco Use   Smoking status: Former    Current packs/day: 0.00    Average  packs/day: 0.1 packs/day for 30.0 years (3.0 ttl pk-yrs)    Types: Cigarettes    Start date: 10/30/1965    Quit date: 10/31/1995    Years since quitting: 28.5   Smokeless tobacco: Never  Vaping Use   Vaping status: Never Used  Substance and Sexual Activity   Alcohol use: Yes    Comment: occ   Drug use: No   Sexual activity: Not Currently    Birth control/protection: Surgical    Comment: 1st intercourse 78 yo-Fewer than 5 partners, hysterectomt  Other Topics Concern   Not on file  Social History Narrative   Lives by herself   2 children, one has mental issues    Social Drivers of Corporate investment banker Strain: Low Risk  (12/12/2023)   Received from [facility]   Overall Financial Resource Strain (CARDIA)    Difficulty of Paying Living Expenses: Not hard at all  Food Insecurity: No Food Insecurity (12/12/2023)   Received from [facility]   Hunger Vital Sign    Within the past 12 months, you worried that your food would run out before you got the money to buy more.: Never true    Within the past 12 months, the food you bought just didn't last and you didn't have money to get more.: Never true  Transportation Needs: Unknown (12/12/2023)   Received from [facility] - Transportation    In the past 12 months, has lack of transportation kept you from medical appointments or from getting medications?: No    Lack of Transportation (Non-Medical): Not on file  Physical Activity: Unknown (04/05/2023)   Exercise Vital Sign    Days of Exercise per Week: 0 days    Minutes of Exercise per Session: Not on file  Recent Concern: Physical Activity -  Inactive (04/05/2023)   Exercise Vital Sign    Days of Exercise per Week: 0 days    Minutes of Exercise per Session: 0 min  Stress: No Stress Concern Present (04/05/2023)   [employer] of Occupational Health - Occupational Stress Questionnaire    Feeling of Stress : Not at all   Social Connections: Unknown (05/30/2023)   Received from [facility]   Social Network    Social Network: Not on file  Intimate Partner Violence: Unknown (05/30/2023)   Received from [facility]   HITS    Physically Hurt: Not on file    Insult or Talk Down To: Not on file    Threaten Physical Harm: Not on file    Scream or Curse: Not on file    Family History  Problem Relation Age of Onset   Osteoporosis Mother    Lung cancer Mother    Stroke Mother    Diabetes Father    Hypertension Father    Osteoporosis Father    Colon cancer Father 10   Breast cancer Sister        Age 15   Leukemia Maternal Grandfather    Crohn's disease Son    Ovarian cancer Maternal Aunt 34   Breast cancer Maternal Aunt        great aunt- Age unknown   Colon cancer Paternal Uncle 68   Breast cancer Cousin        maternal-Age 78   Colon cancer Cousin        maternal first cousin     Allergies  Allergen Reactions   Doxycycline  Rash   Other Other (See Comments)    Surgical glue : hives   Oxycodone  Nausea And Vomiting   Sulfa  Antibiotics Hives   Trimethoprim  Sulfate [Trimethoprim ]     rash   Codeine Nausea Only   Propranolol Hcl Other (See Comments)    Dizziness and heart racing      Patient's last menstrual period was No LMP recorded. Patient has had a hysterectomy..            Review of Systems Alls systems reviewed and are negative.     Physical Exam Constitutional:      Appearance: Normal appearance.  Genitourinary:     Vulva normal.     No lesions in the vagina.     Right Labia: No rash, lesions or skin changes.    Left Labia: No lesions, skin changes or rash.    Vaginal cuff intact.    No vaginal discharge or tenderness.     Anterior vaginal prolapse present.    Mild vaginal atrophy present.     Right Adnexa: absent.    Left Adnexa: absent.    Cervix is absent.     Uterus is absent.  Breasts:    Right: Normal.     Left: Normal.  HENT:     Head:  Normocephalic.  Neck:     Thyroid : No thyroid  mass, thyromegaly or thyroid  tenderness.  Cardiovascular:     Rate and Rhythm: Normal rate and regular rhythm.     Heart sounds: Normal heart sounds, S1 normal and S2 normal.  Pulmonary:     Effort: Pulmonary effort is normal.     Breath sounds: Normal breath sounds and air entry.  Abdominal:     General: Bowel sounds are normal. There is no distension.     Palpations: Abdomen is soft. There is no mass.     Tenderness: There is no  abdominal tenderness. There is no guarding or rebound.  Musculoskeletal:     Cervical back: Full passive range of motion without pain, normal range of motion and neck supple. No tenderness.     Right lower leg: No edema.     Left lower leg: No edema.  Neurological:     Mental Status: She is alert.  Skin:    General: Skin is warm.  Psychiatric:        Mood and Affect: Mood normal.        Behavior: Behavior normal.        Thought Content: Thought content normal.  Vitals and nursing note reviewed. Exam conducted with a chaperone present.       A:         Well Woman GYN exam                             P:        Pap smear collected today Encouraged annual mammogram screening Colon cancer screening up-to-date DXA scheduled 8/21. Patient with recent fracture with fall and last scan with positive frax score and should be on bone builder. Patient agrees. To still complete scan but treatment should be started. Patient agrees.  Counseled on the r/b/a/I of the medications Labs and immunizations ordered today Discussed breast self exams Encouraged healthy lifestyle practices Encouraged Vit D and Calcium    No follow-ups on file.  Almarie MARLA Carpen

## 2024-05-17 LAB — PARATHYROID HORMONE, INTACT (NO CA): PTH: 18 pg/mL (ref 16–77)

## 2024-05-17 LAB — COMPREHENSIVE METABOLIC PANEL WITH GFR
AG Ratio: 1.9 (calc) (ref 1.0–2.5)
ALT: 21 U/L (ref 6–29)
AST: 21 U/L (ref 10–35)
Albumin: 4.9 g/dL (ref 3.6–5.1)
Alkaline phosphatase (APISO): 120 U/L (ref 37–153)
BUN: 14 mg/dL (ref 7–25)
CO2: 26 mmol/L (ref 20–32)
Calcium: 9.7 mg/dL (ref 8.6–10.4)
Chloride: 99 mmol/L (ref 98–110)
Creat: 0.8 mg/dL (ref 0.60–1.00)
Globulin: 2.6 g/dL (ref 1.9–3.7)
Glucose, Bld: 87 mg/dL (ref 65–99)
Potassium: 4 mmol/L (ref 3.5–5.3)
Sodium: 136 mmol/L (ref 135–146)
Total Bilirubin: 0.5 mg/dL (ref 0.2–1.2)
Total Protein: 7.5 g/dL (ref 6.1–8.1)
eGFR: 76 mL/min/1.73m2 (ref >=60)

## 2024-05-17 LAB — VITAMIN D 25 HYDROXY (VIT D DEFICIENCY, FRACTURES): Vit D, 25-Hydroxy: 94 ng/mL (ref 30–100)

## 2024-05-20 ENCOUNTER — Ambulatory Visit: Payer: Self-pay | Admitting: Nurse Practitioner

## 2024-05-21 DIAGNOSIS — L821 Other seborrheic keratosis: Secondary | ICD-10-CM | POA: Diagnosis not present

## 2024-05-23 ENCOUNTER — Encounter: Admitting: Internal Medicine

## 2024-05-23 LAB — CYTOLOGY - PAP: Diagnosis: NEGATIVE

## 2024-06-02 ENCOUNTER — Other Ambulatory Visit: Payer: Self-pay | Admitting: *Deleted

## 2024-06-02 DIAGNOSIS — M810 Age-related osteoporosis without current pathological fracture: Secondary | ICD-10-CM

## 2024-06-02 MED ORDER — DENOSUMAB 60 MG/ML ~~LOC~~ SOSY
60.0000 mg | PREFILLED_SYRINGE | Freq: Once | SUBCUTANEOUS | Status: AC
Start: 2024-06-16 — End: ?

## 2024-06-12 DIAGNOSIS — T84226A Displacement of internal fixation device of vertebrae, initial encounter: Secondary | ICD-10-CM | POA: Diagnosis not present

## 2024-06-12 DIAGNOSIS — M4312 Spondylolisthesis, cervical region: Secondary | ICD-10-CM | POA: Diagnosis not present

## 2024-06-12 DIAGNOSIS — M5459 Other low back pain: Secondary | ICD-10-CM | POA: Diagnosis not present

## 2024-06-12 DIAGNOSIS — M4125 Other idiopathic scoliosis, thoracolumbar region: Secondary | ICD-10-CM | POA: Diagnosis not present

## 2024-06-12 DIAGNOSIS — M4325 Fusion of spine, thoracolumbar region: Secondary | ICD-10-CM | POA: Diagnosis not present

## 2024-06-12 DIAGNOSIS — M79604 Pain in right leg: Secondary | ICD-10-CM | POA: Diagnosis not present

## 2024-06-13 ENCOUNTER — Other Ambulatory Visit (HOSPITAL_COMMUNITY)
Admission: RE | Admit: 2024-06-13 | Discharge: 2024-06-13 | Disposition: A | Discharge status: Home / Self Care | Source: Other Acute Inpatient Hospital | Attending: Obstetrics and Gynecology | Admitting: Obstetrics and Gynecology

## 2024-06-13 ENCOUNTER — Ambulatory Visit (INDEPENDENT_AMBULATORY_CARE_PROVIDER_SITE_OTHER)

## 2024-06-13 DIAGNOSIS — R82998 Other abnormal findings in urine: Secondary | ICD-10-CM | POA: Diagnosis not present

## 2024-06-13 DIAGNOSIS — R1011 Right upper quadrant pain: Secondary | ICD-10-CM

## 2024-06-13 DIAGNOSIS — R350 Frequency of micturition: Secondary | ICD-10-CM

## 2024-06-13 DIAGNOSIS — R300 Dysuria: Secondary | ICD-10-CM

## 2024-06-13 DIAGNOSIS — R8290 Unspecified abnormal findings in urine: Secondary | ICD-10-CM

## 2024-06-13 LAB — POCT URINALYSIS DIP (CLINITEK)
Bilirubin, UA: NEGATIVE
Glucose, UA: NEGATIVE mg/dL
Ketones, POC UA: NEGATIVE mg/dL
Nitrite, UA: NEGATIVE
POC PROTEIN,UA: NEGATIVE
Spec Grav, UA: <=1.005 — AB (ref 1.010–1.025)
Urobilinogen, UA: 0.2 U/dL
pH, UA: 6 (ref 5.0–8.0)

## 2024-06-13 MED ORDER — NITROFURANTOIN MONOHYD MACRO 100 MG PO CAPS: 100.0000 mg | ORAL_CAPSULE | Freq: Two times a day (BID) | ORAL | 0 refills | Status: DC

## 2024-06-13 NOTE — Patient Instructions (Signed)
 Your Urine dip that was done in office was Positive. I am sending the urine off for culture and you can take AZO over the counter for your discomfort.  We have ordered macrobid for you to take while we wait for your culture results, hopefully this gives you some relief. We will contact you when the results are back between 3-5 days.  If a different antibiotic is needed we will sent the order to the pharmacy and you will be notified. If you have any questions or concerns please feel free to call us  at [phone]

## 2024-06-13 NOTE — Progress Notes (Signed)
 Cynthia Thornton arrived today with cloudy urine and dysuria. Patient is notexperiencing fever, unstable vitals and/or one-sided back flank pain. Patient has not had had a recent hospitalization due to UTI.  Last visit in the office was 02/22/2024.  Per protocol:   The most recent Urinalysis completed on 06/13/2024 and was notnormal.  Last Creatinine level  Lab Results  Component Value Date   CREATININE 0.80 05/16/2024    An urine specimen was collected and POCT urinalysis completed. [] A cath specimen was collected due to patient's current condition, symptoms or post-procedural state.  Total urine output by catheter is  Output by Drain (mL) 06/11/24 0701 - 06/11/24 1900 06/11/24 1901 - 06/12/24 0700 06/12/24 0701 - 06/12/24 1900 06/12/24 1901 - 06/13/24 0700 06/13/24 0701 - 06/13/24 1019  Patient has no LDAs of requested type attached.    SABRA    POCT Urine results is not normal.  Urine micro was not sent per protocol for abnormal urinalysis.  Urine culture was sent per protocol for abnormal urinalysis.     [x] Pt was notified of positive urine results and plan for additional urine testing. We will contact you within the next 3-4 days with these results.  [] No Prescription was sent to your pharmacy.  The additional testing will indicate if a prescription is needed.   [x] Patient was notified of abnormal urine results. The following prescription is sent to your preferred pharmacy.  [x]  Macrobid  100mg  #10 1 tablet by mouth twice daily with food for 5 days      []  Bactrim  DS 800-160mg  #6 1 tablet by mouth twice daily for 3 days        []  Due to your current medication allergies, an alternate prescription was discussed with your provider and will be prescribed and sent to your pharmacy.  [x] You can take over the counter AZO two tablets up to three times a day for two days.  Take AZO tablets with a full glass of water. AZO will turn your urine orange, this is normal.   [] The patient was notified of negative  urine results.  If symptoms persist, you may take over the counter AZO two tablets up to three times a day for two days.  AZO will turn your urine orange, this is normal.  Contact the office back to schedule an appointment if your symptoms persist or worsen or you develop additional symptoms.       CC'd note to patient's provider.

## 2024-06-15 ENCOUNTER — Ambulatory Visit: Payer: Self-pay | Admitting: Obstetrics and Gynecology

## 2024-06-15 LAB — URINE CULTURE: Culture: 60000 — AB

## 2024-06-19 ENCOUNTER — Ambulatory Visit (HOSPITAL_BASED_OUTPATIENT_CLINIC_OR_DEPARTMENT_OTHER)
Admission: RE | Admit: 2024-06-19 | Discharge: 2024-06-19 | Disposition: A | Discharge status: Home / Self Care | Source: Ambulatory Visit | Attending: Obstetrics and Gynecology | Admitting: Obstetrics and Gynecology

## 2024-06-19 DIAGNOSIS — M810 Age-related osteoporosis without current pathological fracture: Secondary | ICD-10-CM | POA: Insufficient documentation

## 2024-06-19 DIAGNOSIS — Z780 Asymptomatic menopausal state: Secondary | ICD-10-CM | POA: Diagnosis not present

## 2024-06-20 ENCOUNTER — Ambulatory Visit: Payer: Self-pay | Admitting: Obstetrics and Gynecology

## 2024-06-20 ENCOUNTER — Telehealth: Payer: Self-pay | Admitting: Obstetrics and Gynecology

## 2024-06-20 ENCOUNTER — Encounter: Payer: Self-pay | Admitting: Internal Medicine

## 2024-06-20 ENCOUNTER — Ambulatory Visit (INDEPENDENT_AMBULATORY_CARE_PROVIDER_SITE_OTHER): Admitting: Internal Medicine

## 2024-06-20 ENCOUNTER — Encounter: Payer: Self-pay | Admitting: Obstetrics and Gynecology

## 2024-06-20 VITALS — BP 120/76 | HR 66 | Temp 98.0°F | Resp 16 | Ht 64.0 in | Wt 142.1 lb

## 2024-06-20 DIAGNOSIS — Z79899 Other long term (current) drug therapy: Secondary | ICD-10-CM

## 2024-06-20 DIAGNOSIS — F419 Anxiety disorder, unspecified: Secondary | ICD-10-CM | POA: Diagnosis not present

## 2024-06-20 DIAGNOSIS — E079 Disorder of thyroid, unspecified: Secondary | ICD-10-CM | POA: Diagnosis not present

## 2024-06-20 DIAGNOSIS — E7800 Pure hypercholesterolemia, unspecified: Secondary | ICD-10-CM | POA: Diagnosis not present

## 2024-06-20 DIAGNOSIS — Z0000 Encounter for general adult medical examination without abnormal findings: Secondary | ICD-10-CM

## 2024-06-20 DIAGNOSIS — C9110 Chronic lymphocytic leukemia of B-cell type not having achieved remission: Secondary | ICD-10-CM | POA: Diagnosis not present

## 2024-06-20 DIAGNOSIS — J479 Bronchiectasis, uncomplicated: Secondary | ICD-10-CM

## 2024-06-20 DIAGNOSIS — Z170 Estrogen receptor positive status [ER+]: Secondary | ICD-10-CM

## 2024-06-20 MED ORDER — GABAPENTIN 300 MG PO CAPS: 300.0000 mg | ORAL_CAPSULE | Freq: Two times a day (BID) | ORAL | Status: AC

## 2024-06-20 NOTE — Telephone Encounter (Signed)
 Bone density results discussed in severe range -3.3 in wrist -2.5 in RFN Has had fracture with fall in right wrist 2-3 years ago.  Has to do cortisol shots for her knees She is taking so many medications and would like to wait at least 6 months. Damien has checked copay 350$ twice a year.  Does not want to take fosamax.  Mother had many complications.  Dr. Glennon

## 2024-06-20 NOTE — Progress Notes (Signed)
 Subjective:    Patient ID: Cynthia Thornton, female    DOB: [birth year], 78 y.o.   MRN: [id number]  DOS:  06/20/2024 Type of visit - description: CPX  Here for CPX Continue with back pain, otherwise  feels well. No chest pain no difficulty breathing, no blood in the stools.  Recently had a UTI, feeling better.  Review of Systems  Other than above, a 14 point review of systems is negative     Past Medical History:  Diagnosis Date   Annual physical exam 07/27/2015   Anxiety 03/29/2019   Arthritis    Breast cancer, left [facility]) oncologist-- dr odean   dx 09-26-2017--- IDC, Stage IA, Grade 2, ER/PR +;  HER2 negative;  s/p  breast lumpectomy w/ node dissection 11-02-2017;  completed radiation 01-09-2018 (left breast genetic screening panel 2017 with variant of unknown significance AXINA)   Bronchiectasis (HCC)    pulmologist-- dr rosiland-- w/ chronic lingular scarring  (last exceratbation bronchiectasis 07/ 2020)   Change in bowel habits 09/10/2014   COLONIC POLYPS, BENIGN 01/16/2008   Qualifier: Diagnosis of   By: Lewellyn CMA (AAMA), Amanda         DEGENERATIVE JOINT DISEASE 01/16/2008   Qualifier: Diagnosis of   By: Lewellyn CMA (AAMA), Amanda      Replacing diagnoses that were inactivated after the 01/29/23 regulatory import     Dyslipidemia    followed by cardiology/ vascular-- dr g. adams ([facility] in [place])  family history strokes   Eczema    ESOPHAGEAL STRICTURE 04/05/2007   Annotation: per EGD  Qualifier: Diagnosis of   By: Amon MD, Aloysius BRAVO.        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  The Comprehensive Cancer Panel offered by [facility] includes sequencing and/or deletion duplication testing of the following 32 genes: APC, ATM, AXIN2,  BARD1, BMPR1A, BRCA1, BRCA2, BRIP1, CDH1, CDK4, CDKN2A, CHEK2, EPCAM, FANCC, MLH1, MSH2, MSH6, MUTYH, NBN, PALB2, PMS2, POLD1, POLE, PTEN, RAD51C, RAD51D, SCG5/GREM1, SMAD4, STK1   GERD 07/30/2006   Qualifier: Diagnosis of   By: Amon MD, Aloysius CHARLENA SALIVA, MULTINODULAR 11/27/2008   Qualifier: Diagnosis of   By: Kassie MD, Sean A        HIATAL HERNIA 08/15/2006   Qualifier: Diagnosis of   By: Earlean CMA (AAMA), Amanda      Replacing diagnoses that were inactivated after the 01/29/23 regulatory import     History of esophageal stricture    s/p dilatation 2008   History of external beam radiation therapy    left breast 12-19-2017  to 01-09-2018   History of hyperthyroidism    endocrinologist-- (lov 04-11-2019 epic) dr kassie, dx 2009 due to multinodular goiter ,  had taken medication for few months 2010 stopped due to norma TFT;     Hypercholesteremia 10/03/2010   Overview:   Chronic [Hypercholesterolemia]     Insomnia    Irritation of left eye 02/07/2022   Malignant neoplasm of lower-inner quadrant of left breast in female, estrogen receptor positive (HCC) 10/02/2017   Osteopenia 02/2018   T score -2.4 distal third of radius.  FRAX 11% / 1.8% stable at other points  of interest to include spine, right and left hip   Osteoporosis    2025 severe -3.3 h/o fracture with fall.   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   Smoking history 01/15/2015   Thyrotoxicosis 10/22/2008   Qualifier: Diagnosis of   By: Tish MD, William         Tinnitus 06/27/2019   UNSPECIFIED ANEMIA 11/02/2008   Qualifier: Diagnosis of   By: Tish MD, Elsie         Wears contact lenses     Past Surgical History:  Procedure Laterality Date   BREAST LUMPECTOMY WITH RADIOACTIVE SEED AND SENTINEL LYMPH NODE BIOPSY Left  11/02/2017   Procedure: LEFT BREAST LUMPECTOMY WITH RADIOACTIVE SEED AND LEFT AXILLARY DEEP SENTINEL LYMPH NODE BIOPSY, INJECT BLUE DYE LEFT BREAST;  Surgeon: Gail Favorite, MD;  Location: [facility];  Service: General;  Laterality: Left;   BREAST SURGERY  2000   Breast cyst removed, left   LAPAROSCOPIC BILATERAL SALPINGO OOPHERECTOMY Bilateral 10/13/2019   Procedure: LAPAROSCOPIC BILATERAL SALPINGO OOPHORECTOMY LYSIS OF ADHESIONS PERITONEAL WASHINGS ;  Surgeon: Lavoie, Marie-Lyne, MD;  Location: [facility];  Service: Gynecology;  Laterality: Bilateral;  request 7:30am OR time in [place] [facility] block requests one hour   TONSILLECTOMY  age 14   TUBAL LIGATION Bilateral yrs ago   VAGINAL HYSTERECTOMY  1991   Right ovarian cystectomy   VIDEO BRONCHOSCOPY Bilateral 03/16/2015   Procedure: VIDEO BRONCHOSCOPY WITHOUT FLUORO;  Surgeon: Dorethia Cave, MD;  Location[facility];  Service: Endoscopy;  Laterality: Bilateral;    Current Outpatient Medications  Medication Instructions   acetaminophen  (TYLENOL ) 1,000 mg, Every 8 hours PRN   clorazepate  (TRANXENE ) 7.5 mg, Oral, At bedtime PRN   estradiol  (ESTRACE ) 0.5 g, Vaginal, 2 times weekly, Place 0.5g nightly twice a week   gabapentin  (NEURONTIN ) 300 mg, Oral, 2 times daily   methenamine  (HIPREX ) 1 g, 2 times daily   omeprazole  (PRILOSEC) 40 mg, Oral, Daily, Patient needs follow up appointment for future refills. Please call [phone] to schedule an appointment.   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       Objective:   Physical Exam BP 120/76   Pulse 66   Temp 98 F (36.7 C) (Oral)   Resp 16   Ht 5' 4 (1.626 m)   Wt 142 lb 2 oz (64.5 kg)   SpO2 95%   BMI 24.40 kg/m  General: Well developed, NAD, BMI noted Neck: No   thyromegaly  HEENT:  Normocephalic . Face symmetric, atraumatic Lungs:  CTA B Normal respiratory effort, no intercostal retractions, no accessory muscle use. Heart: RRR,  no murmur.  Abdomen:  Not distended, soft, non-tender. No rebound or rigidity.   Lower extremities: no pretibial edema bilaterally  Skin: Exposed areas without rash. Not pale. Not jaundice Neurologic:  alert & oriented X3.  Speech normal, gait and posture are slightly limited by pain. Strength symmetric and appropriate for age.  Psych: Cognition and judgment appear intact.  Cooperative with normal attention span and concentration.  Behavior appropriate. No anxious or depressed appearing.  Assessment     ASSESSMENT Bronchiectasis Dr Geronimo Anxiety - Insomnia : tranxene    At hs, rx by gyn, previously, I'll Rx if needed  Endocrinology Dr. Kassie q 3 years, Last visit 03/2015 --H/o thyrotoxicosis on remission --Goiter   Osteopenia --T score -1.5  (2015), dexa 02-2016 , dexa 03/19/2018 (at gyn) DJD -- [facility] GI: GERD, HH and esophageal stricture EGD 2007, colon polyps Breast cancer, DX 08-2017, s/p lumpectomy,  XRT, B salpingo-oophorectomy Recurrent UTIs/OAB., onset ~ 2018.  Saw urogyne urologist      PLAN Here for CPX Td 2020 - zostavax 2010.  S/p Shingrix .  S/p RSV per patient - prevanr 2014; pnm : 07-2015;  PNM 20:2023 per pt  - vax I rec:  flu shot q fall, COVID booster  -CCS: Last colonoscopy 08-2015, + polyps, cscope 04/2019, next 5 years per GI. plans to proceed w/ a cscope this year  Female care:  --Sees gyn, s/p BSO  09/2019 (d/t h/o breast ca) ---breast ca dx 08-2017, MMG 10/03/2023  ([facility]) - Recent labs reviewed.:  Get FLP CBC TSH - DEXA per gyn, takes Vit D - [facility] POA: Recommend to bring a copy  Other issues addressed Bronchiectasis, history of: Basically asymptomatic Anxiety insomnia: Well-controlled on Tranxene  Back pain: Status post extensive surgery few months ago, back  pain is still an issue, on gabapentin , Ultram  as needed.  Will see her surgery soon.  She could consider seeing a local pain management doctor. High cholesterol: Saw cardiology 04/11/2024, will fax FLP to them History of thyroid  disease: Checking labs. RTC 1 year for CPX

## 2024-06-20 NOTE — Patient Instructions (Addendum)
 Vaccines are recommended: Flu shot every fall A COVID booster      GO TO THE LAB :  Get the blood work   Your results will be posted on MyChart with my comments  Go to the front desk for the checkout Please make an appointment for a physical exam in 1 year.  Come back sooner if needed

## 2024-06-21 LAB — CBC WITH DIFFERENTIAL/PLATELET
Absolute Lymphocytes: 2144 {cells}/uL (ref 850–3900)
Absolute Monocytes: 518 {cells}/uL (ref 200–950)
Basophils Absolute: 58 {cells}/uL (ref 0–200)
Basophils Relative: 0.9 %
Eosinophils Absolute: 147 {cells}/uL (ref 15–500)
Eosinophils Relative: 2.3 %
HCT: 36.3 % (ref 35.0–45.0)
Hemoglobin: 12.5 g/dL (ref 11.7–15.5)
MCH: 32.6 pg (ref 27.0–33.0)
MCHC: 34.4 g/dL (ref 32.0–36.0)
MCV: 94.8 fL (ref 80.0–100.0)
MPV: 10.1 fL (ref 7.5–12.5)
Monocytes Relative: 8.1 %
Neutro Abs: 3533 {cells}/uL (ref 1500–7800)
Neutrophils Relative %: 55.2 %
Platelets: 325 Thousand/uL (ref 140–400)
RBC: 3.83 Million/uL (ref 3.80–5.10)
RDW: 13.8 % (ref 11.0–15.0)
Total Lymphocyte: 33.5 %
WBC: 6.4 Thousand/uL (ref 3.8–10.8)

## 2024-06-21 LAB — LIPID PANEL
Cholesterol: 217 mg/dL — ABNORMAL HIGH (ref <200)
HDL: 108 mg/dL (ref >=50)
LDL Cholesterol (Calc): 92 mg/dL
Non-HDL Cholesterol (Calc): 109 mg/dL (ref <130)
Total CHOL/HDL Ratio: 2 (calc) (ref <5.0)
Triglycerides: 80 mg/dL (ref <150)

## 2024-06-21 LAB — TSH: TSH: 1.98 m[IU]/L (ref 0.40–4.50)

## 2024-06-22 ENCOUNTER — Encounter: Payer: Self-pay | Admitting: Internal Medicine

## 2024-06-22 LAB — DRUG MONITORING PANEL 375977 , URINE
Alcohol Metabolites: NEGATIVE ng/mL (ref <500)
Alphahydroxyalprazolam: NEGATIVE ng/mL (ref <25)
Alphahydroxymidazolam: NEGATIVE ng/mL (ref <50)
Alphahydroxytriazolam: NEGATIVE ng/mL (ref <50)
Aminoclonazepam: NEGATIVE ng/mL (ref <25)
Amphetamines: NEGATIVE ng/mL (ref <500)
Barbiturates: NEGATIVE ng/mL (ref <300)
Benzodiazepines: POSITIVE ng/mL — AB (ref <100)
Cocaine Metabolite: NEGATIVE ng/mL (ref <150)
Desmethyltramadol: NEGATIVE ng/mL (ref <100)
Hydroxyethylflurazepam: NEGATIVE ng/mL (ref <50)
Lorazepam: NEGATIVE ng/mL (ref <50)
Marijuana Metabolite: NEGATIVE ng/mL (ref <20)
Nordiazepam: NEGATIVE ng/mL (ref <50)
Opiates: NEGATIVE ng/mL (ref <100)
Oxazepam: 156 ng/mL — ABNORMAL HIGH (ref <50)
Oxycodone: NEGATIVE ng/mL (ref <100)
Temazepam: NEGATIVE ng/mL (ref <50)
Tramadol: NEGATIVE ng/mL (ref <100)

## 2024-06-22 LAB — DM TEMPLATE

## 2024-06-22 NOTE — Assessment & Plan Note (Signed)
 Here for CPX   Other issues addressed Bronchiectasis, history of: Basically asymptomatic Anxiety insomnia: Well-controlled on Tranxene  Back pain: Status post extensive surgery few months ago, back pain is still an issue, on gabapentin , Ultram  as needed.  Will see her surgery soon.  She could consider seeing a local pain management doctor. High cholesterol: Saw cardiology 04/11/2024, will fax FLP to them History of thyroid  disease: Checking labs. RTC 1 year for CPX

## 2024-06-22 NOTE — Assessment & Plan Note (Signed)
 Here for CPX Td 2020 - zostavax 2010.  S/p Shingrix .  S/p RSV per patient - prevanr 2014; pnm : 07-2015;  PNM 20:2023 per pt  - vax I rec:  flu shot q fall, COVID booster  -CCS: Last colonoscopy 08-2015, + polyps, cscope 04/2019, next 5 years per GI. plans to proceed w/ a cscope this year  Female care:  --Sees gyn, s/p BSO  09/2019 (d/t h/o breast ca) ---breast ca dx 08-2017, MMG 10/03/2023  ([facility]) - Recent labs reviewed.:  Get FLP CBC TSH - DEXA per gyn, takes Vit D - [facility] POA: Recommend to bring a copy

## 2024-06-25 ENCOUNTER — Ambulatory Visit: Payer: Self-pay | Admitting: Internal Medicine

## 2024-06-26 ENCOUNTER — Ambulatory Visit: Admitting: Emergency Medicine

## 2024-06-26 ENCOUNTER — Ambulatory Visit: Payer: Self-pay | Admitting: Emergency Medicine

## 2024-06-26 ENCOUNTER — Encounter: Payer: Self-pay | Admitting: Emergency Medicine

## 2024-06-26 ENCOUNTER — Telehealth: Payer: Self-pay | Admitting: Emergency Medicine

## 2024-06-26 ENCOUNTER — Ambulatory Visit (INDEPENDENT_AMBULATORY_CARE_PROVIDER_SITE_OTHER)

## 2024-06-26 ENCOUNTER — Ambulatory Visit: Payer: Self-pay | Admitting: Internal Medicine

## 2024-06-26 VITALS — BP 118/73 | HR 92 | Ht 64.5 in | Wt 143.0 lb

## 2024-06-26 DIAGNOSIS — R042 Hemoptysis: Secondary | ICD-10-CM

## 2024-06-26 DIAGNOSIS — Z87891 Personal history of nicotine dependence: Secondary | ICD-10-CM

## 2024-06-26 DIAGNOSIS — Z981 Arthrodesis status: Secondary | ICD-10-CM | POA: Diagnosis not present

## 2024-06-26 MED ORDER — LEVOFLOXACIN 500 MG PO TABS: 500.0000 mg | ORAL_TABLET | Freq: Every day | ORAL | 0 refills | Status: DC

## 2024-06-26 NOTE — Progress Notes (Signed)
 Subjective:    Patient ID: Cynthia Thornton, female    DOB: January 22, 1946, 78 y.o.   MRN: [id number]  HPI Acute visit 06/26/2024:  Cynthia Thornton is 78, former smoker, who is followed by Dr. Geronimo and our office for bronchiectasis and chronic lingular scarring.  She also has a history of left breast cancer, eczema, esophageal strictures, GERD with a hiatal hernia.   She is here today reporting that she had acute onset hemoptysis today- happened 3 times, about half tablespoon. She has had this before but not for years. Her throat felt raw- notes that she did a really hard cough that strained her throat. No real mucous and she does not have frequent cough at baseline. No URI or prodrome, associated sx. No fevers.    Review of Systems As per HPI  Past Medical History:  Diagnosis Date   Annual physical exam 07/27/2015   Anxiety 03/29/2019   Arthritis    Breast cancer, left [facility]) oncologist-- dr odean   dx 09-26-2017--- IDC, Stage IA, Grade 2, ER/PR +;  HER2 negative;  s/p  breast lumpectomy w/ node dissection 11-02-2017;  completed radiation 01-09-2018 (left breast genetic screening panel 2017 with variant of unknown significance AXINA)   Bronchiectasis (HCC)    pulmologist-- dr rosiland-- w/ chronic lingular scarring  (last exceratbation bronchiectasis 07/ 2020)   Change in bowel habits 09/10/2014   COLONIC POLYPS, BENIGN 01/16/2008   Qualifier: Diagnosis of   By: Lewellyn CMA (AAMA), Amanda         DEGENERATIVE JOINT DISEASE 01/16/2008   Qualifier: Diagnosis of   By: Lewellyn CMA (AAMA), Amanda      Replacing diagnoses that were inactivated after the 01/29/23 regulatory import     Dyslipidemia    followed by cardiology/ vascular-- dr g. adams ([facility] in [place])  family history strokes   Eczema    ESOPHAGEAL STRICTURE 04/05/2007   Annotation: per EGD  Qualifier: Diagnosis of   By: Amon MD, Aloysius BRAVO.        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  The Comprehensive Cancer Panel offered by [facility] includes sequencing and/or deletion duplication testing of the following 32 genes: APC, ATM, AXIN2, BARD1, BMPR1A, BRCA1, BRCA2, BRIP1, CDH1, CDK4, CDKN2A, CHEK2, EPCAM, FANCC, MLH1, MSH2, MSH6, MUTYH, NBN, PALB2, PMS2, POLD1, POLE, PTEN, RAD51C, RAD51D, SCG5/GREM1, SMAD4, STK1   GERD 07/30/2006   Qualifier: Diagnosis of   By: Amon MD, Aloysius CHARLENA SALIVA, MULTINODULAR 11/27/2008   Qualifier: Diagnosis of   By: Kassie MD, Sean A        HIATAL HERNIA 08/15/2006   Qualifier: Diagnosis of   By: Earlean CMA (AAMA), Amanda      Replacing diagnoses that were inactivated after the 01/29/23 regulatory import     History of esophageal stricture    s/p dilatation 2008   History of external beam radiation therapy    left breast 12-19-2017  to 01-09-2018   History of hyperthyroidism    endocrinologist-- (lov 04-11-2019 epic) dr kassie, dx 2009 due to multinodular goiter ,  had taken medication for few months 2010 stopped due to norma TFT;     Hypercholesteremia  10/03/2010   Overview:   Chronic [Hypercholesterolemia]     Insomnia    Irritation of left eye 02/07/2022   Malignant neoplasm of lower-inner quadrant of left breast in female, estrogen receptor positive (HCC) 10/02/2017   Osteopenia 02/2018   T score -2.4 distal third of radius.  FRAX 11% / 1.8% stable at other points of interest to include spine, right and left hip   Osteoporosis    2025 severe -3.3 h/o fracture with fall.   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   Smoking history 01/15/2015    Thyrotoxicosis 10/22/2008   Qualifier: Diagnosis of   By: Tish MD, William         Tinnitus 06/27/2019   UNSPECIFIED ANEMIA 11/02/2008   Qualifier: Diagnosis of   By: Tish MD, Elsie         Wears contact lenses     Family History  Problem Relation Age of Onset   Osteoporosis Mother    Lung cancer Mother    Stroke Mother    Diabetes Father    Hypertension Father    Osteoporosis Father    Colon cancer Father 79   Breast cancer Sister        Age 36   Leukemia Maternal Grandfather    Crohn's disease Son    Ovarian cancer Maternal Aunt 74   Breast cancer Maternal Aunt        great aunt- Age unknown   Colon cancer Paternal Uncle 68   Breast cancer Cousin        maternal-Age 17   Colon cancer Cousin        maternal first cousin     Social History   Socioeconomic History   Marital status: Single    Spouse name: Not on file   Number of children: 2   Years of education: Not on file   Highest education level: Master's degree (e.g., MA, MS, MEng, MEd, MSW, MBA)  Occupational History   Occupation: Runner, broadcasting/film/video, works part time     Associate Professor: [employer]  Tobacco Use   Smoking status: Former    Current packs/day: 0.00    Average packs/day: 0.1 packs/day for 30.0 years (3.0 ttl pk-yrs)    Types: Cigarettes    Start date: 10/30/1965    Quit date: 10/31/1995    Years since quitting: 28.6   Smokeless tobacco: Never  Vaping Use   Vaping status: Never Used  Substance and Sexual Activity   Alcohol use: Yes    Comment: occ   Drug use: No   Sexual activity: Not Currently    Birth control/protection: Surgical    Comment: 1st intercourse 78 yo-Fewer than 5 partners, hysterectomt  Other Topics Concern   Not on file  Social History Narrative   Lives by herself   2 children, one has mental issues    Social Drivers of Corporate investment banker Strain: Low Risk  (06/18/2024)   Overall Financial Resource Strain (CARDIA)    Difficulty of Paying Living Expenses: Not hard at  all  Food Insecurity: No Food Insecurity (06/18/2024)   Hunger Vital Sign    Worried About Running Out of Food in the Last Year: Never true    Ran Out of Food in the Last Year: Never true  Transportation Needs: No Transportation Needs (06/18/2024)   PRAPARE - Administrator, Civil Service (Medical): No    Lack of Transportation (Non-Medical): No  Physical Activity: Inactive (06/18/2024)   Exercise Vital Sign    Days of Exercise per Week: 0 days    Minutes of Exercise per Session: Not on file  Stress: Stress Concern Present (06/18/2024)   [employer] of Occupational Health - Occupational Stress Questionnaire    Feeling of Stress: To some extent  Social Connections: Moderately Isolated (06/18/2024)   Social Connection and Isolation Panel    Frequency of Communication with Friends and Family: Once a week    Frequency of Social Gatherings with Friends and Family: Once a week    Attends Religious Services: More than 4 times per year    Active Member of [employer] or Organizations: Yes    Attends Banker Meetings: More than 4 times per year    Marital Status: Divorced  Intimate Partner Violence: Unknown (05/30/2023)   Received from [facility]   HITS    Physically Hurt: Not on file    Insult or Talk Down To: Not on file    Threaten Physical Harm: Not on file    Scream or Curse: Not on file    Allergies  Allergen Reactions   Doxycycline  Rash   Other Other (See Comments)    Surgical glue : hives   Oxycodone  Nausea And Vomiting   Sulfa  Antibiotics Hives   Trimethoprim  Sulfate [Trimethoprim ]     rash   Codeine Nausea Only   Propranolol Hcl Other (See Comments)    Dizziness and heart racing    Current Outpatient Medications on File Prior to Visit  Medication Sig Dispense Refill   acetaminophen  (TYLENOL ) 500 MG tablet Take 1,000 mg by mouth every 8 (eight) hours as needed.     clorazepate  (TRANXENE ) 7.5 MG tablet TAKE 1 TABLET (7.5 MG TOTAL) BY MOUTH AT  BEDTIME AS NEEDED FOR ANXIETY. 30 tablet 1   estradiol  (ESTRACE ) 0.1 MG/GM vaginal cream Place 0.5 g vaginally 2 (two) times a week. Place 0.5g nightly twice a week 42.5 g 11   gabapentin  (NEURONTIN ) 300 MG capsule Take 1 capsule (300 mg total) by mouth 2 (two) times daily.     methenamine  (HIPREX ) 1 g tablet Take 1 g by mouth 2 (two) times daily.     omeprazole  (PRILOSEC) 40 MG capsule Take 1 capsule (40 mg total) by mouth daily. Patient needs follow up appointment for future refills. Please call [phone] to schedule an appointment. 90 capsule 3   ondansetron  (ZOFRAN -ODT) 4 MG disintegrating tablet TAKE 1 TABLET BY MOUTH EVERY 8 HOURS AS NEEDED FOR NAUSEA OR VOMITING 30 tablet 0   Probiotic Product (PROBIOTIC PO) Take 1 tablet by mouth daily.     traMADol  (ULTRAM ) 50 MG tablet Take 50 mg by mouth every 6 (six) hours as needed.     valACYclovir  (VALTREX ) 500 MG tablet TAKE 1 TABLET (500 MG TOTAL) BY MOUTH DAILY. 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 90 tablet 4   methylPREDNISolone  (MEDROL  DOSEPAK) 4 MG TBPK tablet Take by mouth as directed. (Patient not taking: Reported on 06/26/2024)     Current Facility-Administered Medications on File Prior to Visit  Medication Dose Route Frequency Provider Last Rate Last Admin   denosumab  (PROLIA ) injection 60 mg  60 mg Subcutaneous Once Boswell, Elizabeth K, MD             Objective:   Physical Exam  Vitals:   06/26/24 1542  BP: 118/73  Pulse: 92  SpO2: 96%  Weight: 143 lb (64.9 kg)  Height: 5' 4.5 (1.638 m)   Gen: Pleasant, well-nourished, in no distress,  normal affect  ENT: No lesions,  mouth clear,  oropharynx clear, no postnasal drip  Neck: No JVD, no stridor  Lungs: No use of accessory muscles, no crackles or wheezing on normal respiration, no wheeze on forced expiration  Cardiovascular: RRR, heart sounds normal, no murmur or gallops, no peripheral edema  Musculoskeletal: No deformities, no cyanosis or clubbing  Neuro: alert, awake, non  focal  Skin: Warm, no lesions or rash      Assessment & Plan:   Hemoptysis Acute onset small-volume hemoptysis.  She has not had significant cough or sputum production that would be consistent with an exacerbation of her bronchiectasis.  That said reasonable to treat as such.  Will give her levofloxacin  for 7 days.  Question whether she may have upper airway strain/injury, consider AVM, etc.  I will check a chest x-ray now to rule out any new parenchymal finding.  If the cough, bleeding continues then she needs a more extensive workup that will include a CT chest, possible evaluation for thromboembolic disease, possible bronchoscopy.  She will follow-up in 7 to 10 days so we can continue the evaluation as needed.  Lamar Chris, MD, PhD 06/26/2024, 4:49 PM [facility] [phone] or if no answer before 7:00PM call [phone] For any issues after 7:00PM please call eLink [phone]  Time spent 31 minutes  Addendum:  Chest x-ray reviewed.  No evidence of infiltrate, acute findings.  Reassuring.  Will notify her about this.  Lamar Chris, MD, PhD 06/26/2024, 4:50 PM [facility] [phone] or if no answer before 7:00PM call [phone] For any issues after 7:00PM please call eLink [phone]

## 2024-06-26 NOTE — Telephone Encounter (Signed)
 PT needs a 10day F/U apt with Dr.byrum or APP. As of now there is no apt available tell OCT is there any slot that I can give this PT that is closer to the 10 day mark? Please advise.

## 2024-06-26 NOTE — Telephone Encounter (Signed)
 VM/ LM - okay per DPR    -NFN

## 2024-06-26 NOTE — Patient Instructions (Signed)
 We will perform a chest x-ray today. Please take Levaquin  once daily for 7 days until completely gone. Use Robitussin DM for cough suppression. Follow-up in our office in 7 to 10 days.  At that time if you are still experiencing cough, seeing blood then we will expand the workup

## 2024-06-26 NOTE — Telephone Encounter (Signed)
 Pt has appt today. NFN

## 2024-06-26 NOTE — Telephone Encounter (Signed)
 Appointment made for today 06/26/2024 at 3:45 PM with Dr Lamar Chris  FYI Only or Action Required?: FYI only for provider.  Patient is followed in Pulmonology for Bronchiectasis, last seen on 11/13/2023 by Cynthia Amel, MD.  Called Nurse Triage reporting Coughing up Blood.  Symptoms began just prior to triage.  Interventions attempted: Nothing.  Symptoms are: improving--no coughing while on the phone with this RN.  Triage Disposition: See HCP Within 4 Hours (Or PCP Triage)  Patient/caregiver understands and will follow disposition?: Yes                     Copied from CRM #[id number]. Topic: Clinical - Red Word Triage >> Jun 26, 2024  1:11 PM [name] wrote: Red Word that prompted transfer to Nurse Triage: Pt is coughing up hand fulls of blood started about five minutes ago. Pt wants antibiotic.   Pt sees Dr. Geronimo in [place]. Reason for Disposition  [1] Coughed up blood AND [2] > 1 tablespoon (15 ml)  Answer Assessment - Initial Assessment Questions Patient states she is coughing up blood---states it is just a few tablespoons Patient states she is not coughing up any more blood  Patient states it has been years since this has happened  Patient states that [name] usually calls in an antibiotic for her since he had done a scope years ago that showed scarring of middle lobe of the right lung Robitussin DM  ---patient takes this  Patient states she needs an hour due to her job Patient denies being in any distress at this time and states that she has no other symptoms but was concerned since it had been a very long time since she has coughed up any blood Patient is advised that if anything worsens to go to the Emergency Room. Patient verbalized understanding.     1. ONSET: When did the cough begin?      Just 5 minutes prior to triage with this rn 2. SEVERITY: How bad is the cough today? Did the blood appear after a coughing spell?       Patient states that she keeps clearing her throat 3. SPUTUM: Describe the color of your sputum (e.g., none, dry cough; clear, Kuzniar, yellow, green)     blood 4. HEMOPTYSIS: How much blood? (e.g., flecks, streaks, tablespoons)     Patient states maybe a few tablespoons with coughing different times--she denies it being a significant amount 5. DIFFICULTY BREATHING: Are you having difficulty breathing? If Yes, ask: How bad is it? (e.g., mild, moderate, severe)      Patient denies 6. FEVER: Do you have a fever? If Yes, ask: What is your temperature, how was it measured, and when did it start?     no 7. CARDIAC HISTORY: Do you have any history of heart disease? (e.g., heart attack, congestive heart failure)      Patient denies 8. LUNG HISTORY: Do you have any history of lung disease?  (e.g., pulmonary embolus, asthma, emphysema)     bronchiectasis 9. PE RISK FACTORS: Do you have a history of blood clots? Note: Other risk factors include recent major surgery, recent prolonged travel, being bedridden.     No 10. OTHER SYMPTOMS: Do you have any other symptoms? (e.g., runny nose, wheezing, chest pain)       No  Protocols used: Coughing Up Blood-A-AH

## 2024-06-26 NOTE — Assessment & Plan Note (Signed)
 Acute onset small-volume hemoptysis.  She has not had significant cough or sputum production that would be consistent with an exacerbation of her bronchiectasis.  That said reasonable to treat as such.  Will give her levofloxacin  for 7 days.  Question whether she may have upper airway strain/injury, consider AVM, etc.  I will check a chest x-ray now to rule out any new parenchymal finding.  If the cough, bleeding continues then she needs a more extensive workup that will include a CT chest, possible evaluation for thromboembolic disease, possible bronchoscopy.  She will follow-up in 7 to 10 days so we can continue the evaluation as needed.

## 2024-06-27 NOTE — Telephone Encounter (Signed)
 I have scheduled the pt 9/9 with Beth.  Nothing further needed.

## 2024-06-27 NOTE — Telephone Encounter (Signed)
 Called the pt, relayed CXR results to the patient. She is aware and verbalized understanding. I have also scheduled pt f/u for 9/9 with Beth.  Nothing further needed.

## 2024-07-04 ENCOUNTER — Encounter: Admitting: Internal Medicine

## 2024-07-04 ENCOUNTER — Ambulatory Visit: Admitting: *Deleted

## 2024-07-04 ENCOUNTER — Telehealth: Payer: Self-pay | Admitting: *Deleted

## 2024-07-04 VITALS — Ht 64.0 in | Wt 142.0 lb

## 2024-07-04 DIAGNOSIS — Z0000 Encounter for general adult medical examination without abnormal findings: Secondary | ICD-10-CM

## 2024-07-04 NOTE — Progress Notes (Signed)
 Please attest this visit in the absence of patient primary care provider.    Subjective:   Cynthia Thornton is a 78 y.o. who presents for a Medicare Wellness preventive visit.  As a reminder, Annual Wellness Visits don't include a physical exam, and some assessments may be limited, especially if this visit is performed virtually. We may recommend an in-person follow-up visit with your provider if needed.  Visit Complete: Virtual I connected with  Cynthia Thornton on 07/04/24 by a audio enabled telemedicine application and verified that I am speaking with the correct person using two identifiers.  Patient Location: Home  Provider Location: Office/Clinic  I discussed the limitations of evaluation and management by telemedicine. The patient expressed understanding and agreed to proceed.  Vital Signs: Because this visit was a virtual/telehealth visit, some criteria may be missing or patient reported. Any vitals not documented were not able to be obtained and vitals that have been documented are patient reported.  VideoDeclined- This patient declined Librarian, academic. Therefore the visit was completed with audio only.  Persons Participating in Visit: Patient.  AWV Questionnaire: No: Patient Medicare AWV questionnaire was not completed prior to this visit.  Cardiac Risk Factors include: advanced age (>55men, >34 women);dyslipidemia;Other (see comment), Risk factor comments: history of breast cancer     Objective:    Today's Vitals   07/04/24 0822  Weight: 142 lb (64.4 kg)  Height: 5' 4 (1.626 m)   Body mass index is 24.37 kg/m.     07/04/2024    8:48 AM 10/02/2022    8:05 PM 07/20/2022    4:39 PM 06/26/2022    2:54 PM 01/06/2021    3:25 PM 04/23/2020    7:14 PM 01/02/2020   10:25 AM  Advanced Directives  Does Patient Have a Medical Advance Directive? Yes No Yes Yes Yes Yes No  Type of Estate agent of [place];Living will  Healthcare  Power of [place];Living will Healthcare Power of [place];Living will Healthcare Power of [place];Living will Healthcare Power of [place];Living will   Does patient want to make changes to medical advance directive? No - Patient declined     No - Patient declined   Copy of Healthcare Power of Attorney in Chart? Yes - validated most recent copy scanned in chart (See row information)   No - copy requested No - copy requested No - copy requested   Would patient like information on creating a medical advance directive?       No - Patient declined    Current Medications (verified) Outpatient Encounter Medications as of 07/04/2024  Medication Sig   acetaminophen  (TYLENOL ) 500 MG tablet Take 1,000 mg by mouth every 8 (eight) hours as needed.   cetirizine (ZYRTEC) 10 MG tablet Take 10 mg by mouth daily.   Cholecalciferol (VITAMIN D ) 50 MCG (2000 UT) CAPS Take 1 each by mouth daily.   clorazepate  (TRANXENE ) 7.5 MG tablet TAKE 1 TABLET (7.5 MG TOTAL) BY MOUTH AT BEDTIME AS NEEDED FOR ANXIETY.   estradiol  (ESTRACE ) 0.1 MG/GM vaginal cream Place 0.5 g vaginally 2 (two) times a week. Place 0.5g nightly twice a week   gabapentin  (NEURONTIN ) 300 MG capsule Take 1 capsule (300 mg total) by mouth 2 (two) times daily.   methenamine  (HIPREX ) 1 g tablet Take 1 g by mouth 2 (two) times daily.   omeprazole  (PRILOSEC) 40 MG capsule Take 1 capsule (40 mg total) by mouth daily. Patient needs follow up appointment for future  refills. Please call [phone] to schedule an appointment.   ondansetron  (ZOFRAN -ODT) 4 MG disintegrating tablet TAKE 1 TABLET BY MOUTH EVERY 8 HOURS AS NEEDED FOR NAUSEA OR VOMITING   Probiotic Product (PROBIOTIC PO) Take 1 tablet by mouth daily.   traMADol  (ULTRAM ) 50 MG tablet Take 50 mg by mouth every 6 (six) hours as needed.   valACYclovir  (VALTREX ) 500 MG tablet TAKE 1 TABLET (500 MG TOTAL) BY MOUTH DAILY. 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   [DISCONTINUED] levofloxacin  (LEVAQUIN ) 500 MG tablet Take 1  tablet (500 mg total) by mouth daily.   [DISCONTINUED] methylPREDNISolone  (MEDROL  DOSEPAK) 4 MG TBPK tablet Take by mouth as directed. (Patient not taking: Reported on 06/26/2024)   Facility-Administered Encounter Medications as of 07/04/2024  Medication   denosumab  (PROLIA ) injection 60 mg    Allergies (verified) Doxycycline , Other, Oxycodone , Sulfa  antibiotics, Trimethoprim  sulfate [trimethoprim ], Codeine, and Propranolol hcl   History: Past Medical History:  Diagnosis Date   Annual physical exam 07/27/2015   Anxiety 03/29/2019   Arthritis    Bilateral cataracts    Breast cancer, left [facility]) oncologist-- dr odean   dx 09-26-2017--- IDC, Stage IA, Grade 2, ER/PR +;  HER2 negative;  s/p  breast lumpectomy w/ node dissection 11-02-2017;  completed radiation 01-09-2018 (left breast genetic screening panel 2017 with variant of unknown significance AXINA)   Bronchiectasis (HCC)    pulmologist-- dr rosiland-- w/ chronic lingular scarring  (last exceratbation bronchiectasis 07/ 2020)   Change in bowel habits 09/10/2014   COLONIC POLYPS, BENIGN 01/16/2008   Qualifier: Diagnosis of   By: Lewellyn CMA (AAMA), Amanda         DEGENERATIVE JOINT DISEASE 01/16/2008   Qualifier: Diagnosis of   By: Lewellyn CMA (AAMA), Amanda      Replacing diagnoses that were inactivated after the 01/29/23 regulatory import     Dyslipidemia    followed by cardiology/ vascular-- dr g. adams ([facility] in [place])  family history strokes   Eczema    ESOPHAGEAL STRICTURE 04/05/2007   Annotation: per EGD  Qualifier: Diagnosis of   By: Amon MD, Aloysius BRAVO.        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  The Comprehensive Cancer Panel offered by [facility] includes sequencing and/or deletion  duplication testing of the following 32 genes: APC, ATM, AXIN2, BARD1, BMPR1A, BRCA1, BRCA2, BRIP1, CDH1, CDK4, CDKN2A, CHEK2, EPCAM, FANCC, MLH1, MSH2, MSH6, MUTYH, NBN, PALB2, PMS2, POLD1, POLE, PTEN, RAD51C, RAD51D, SCG5/GREM1, SMAD4, STK1   GERD 07/30/2006   Qualifier: Diagnosis of   By: Amon MD, Aloysius CHARLENA SALIVA, MULTINODULAR 11/27/2008   Qualifier: Diagnosis of   By: Kassie MD, Sean A        HIATAL HERNIA 08/15/2006   Qualifier: Diagnosis of   By: Lewellyn CMA (AAMA), Amanda      Replacing diagnoses that were inactivated after the 01/29/23 regulatory import     History of esophageal stricture    s/p dilatation 2008   History of external beam radiation therapy    left breast 12-19-2017  to 01-09-2018   History of hyperthyroidism    endocrinologist-- (lov 04-11-2019 epic) dr kassie, dx 2009 due to multinodular goiter ,  had taken medication for few months 2010 stopped due to norma TFT;     Hypercholesteremia 10/03/2010   Overview:   Chronic [Hypercholesterolemia]     Insomnia    Irritation of left eye 02/07/2022   Malignant neoplasm of lower-inner quadrant of left breast in female, estrogen receptor positive (HCC) 10/02/2017   Osteopenia 02/2018   T score -2.4 distal third of radius.  FRAX 11% / 1.8% stable at other points of interest to include spine, right and left hip   Osteoporosis    2025 severe -3.3 h/o fracture with fall.   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   Smoking history 01/15/2015   Thyrotoxicosis 10/22/2008   Qualifier: Diagnosis of   By: Tish MD, William         Tinnitus 06/27/2019   UNSPECIFIED ANEMIA 11/02/2008   Qualifier: Diagnosis of   By: Tish MD, Elsie         Wears contact lenses    Past Surgical History:  Procedure Laterality Date   BREAST LUMPECTOMY  WITH RADIOACTIVE SEED AND SENTINEL LYMPH NODE BIOPSY Left 11/02/2017   Procedure: LEFT BREAST LUMPECTOMY WITH RADIOACTIVE SEED AND LEFT AXILLARY DEEP SENTINEL LYMPH NODE BIOPSY, INJECT BLUE DYE LEFT BREAST;  Surgeon: Gail Favorite, MD;  Location: [facility];  Service: General;  Laterality: Left;   BREAST SURGERY  2000   Breast cyst removed, left   LAPAROSCOPIC BILATERAL SALPINGO OOPHERECTOMY Bilateral 10/13/2019   Procedure: LAPAROSCOPIC BILATERAL SALPINGO OOPHORECTOMY LYSIS OF ADHESIONS PERITONEAL WASHINGS ;  Surgeon: Lavoie, Marie-Lyne, MD;  Location[facility];  Service: Gynecology;  Laterality: Bilateral;  request 7:30am OR time in [place] [facility] block requests one hour   SPINAL FUSION  12/03/2023   Dr Walterine Persons   TONSILLECTOMY  age 68   TUBAL LIGATION Bilateral yrs ago   VAGINAL HYSTERECTOMY  1991   Right ovarian cystectomy   VIDEO BRONCHOSCOPY Bilateral 03/16/2015   Procedure: VIDEO BRONCHOSCOPY WITHOUT FLUORO;  Surgeon: Dorethia Cave, MD;  [facility];  Service: Endoscopy;  Laterality: Bilateral;   Family History  Problem Relation Age of Onset   Osteoporosis Mother    Lung cancer Mother    Stroke Mother    Diabetes Father    Hypertension Father    Osteoporosis Father    Colon cancer Father 78   Breast cancer Sister        Age 64   Leukemia Maternal Grandfather    Crohn's disease Son    Ovarian cancer Maternal Aunt 60   Breast cancer Maternal Aunt        great aunt- Age unknown   Colon cancer Paternal Uncle 69   Breast cancer Cousin        maternal-Age 32   Colon cancer Cousin        maternal first cousin   Social History   Socioeconomic History   Marital status: Single    Spouse name: Not on file   Number of children: 2   Years of education: Not on file   Highest education level: Master's degree (e.g., MA, MS, MEng, MEd, MSW, MBA)  Occupational History   Occupation: Runner, broadcasting/film/video, works part time     Associate Professor:  [employer]  Tobacco Use   Smoking status: Former  Current packs/day: 0.00    Average packs/day: 0.1 packs/day for 30.0 years (3.0 ttl pk-yrs)    Types: Cigarettes    Start date: 10/30/1965    Quit date: 10/31/1995    Years since quitting: 28.6   Smokeless tobacco: Never  Vaping Use   Vaping status: Never Used  Substance and Sexual Activity   Alcohol use: Yes    Comment: occ   Drug use: No   Sexual activity: Not Currently    Birth control/protection: Surgical    Comment: 1st intercourse 78 yo-Fewer than 5 partners, hysterectomt  Other Topics Concern   Not on file  Social History Narrative   Lives by herself   2 children, one has mental issues    Social Drivers of Corporate investment banker Strain: Low Risk  (07/04/2024)   Overall Financial Resource Strain (CARDIA)    Difficulty of Paying Living Expenses: Not very hard  Food Insecurity: No Food Insecurity (07/04/2024)   Hunger Vital Sign    Worried About Running Out of Food in the Last Year: Never true    Ran Out of Food in the Last Year: Never true  Transportation Needs: No Transportation Needs (07/04/2024)   PRAPARE - Administrator, Civil Service (Medical): No    Lack of Transportation (Non-Medical): No  Physical Activity: Inactive (07/04/2024)   Exercise Vital Sign    Days of Exercise per Week: 0 days    Minutes of Exercise per Session: 0 min  Stress: Stress Concern Present (07/04/2024)   [employer] of Occupational Health - Occupational Stress Questionnaire    Feeling of Stress: To some extent  Social Connections: Moderately Isolated (07/04/2024)   Social Connection and Isolation Panel    Frequency of Communication with Friends and Family: Once a week    Frequency of Social Gatherings with Friends and Family: Once a week    Attends Religious Services: More than 4 times per year    Active Member of [employer] or Organizations: Yes    Attends Engineer, structural: More than 4 times per year     Marital Status: Divorced    Tobacco Counseling Counseling given: Not Answered    Clinical Intake:  Pre-visit preparation completed: Yes  Pain : No/denies pain     BMI - recorded: 24.37 Nutritional Status: BMI of 19-24  Normal Nutritional Risks: None Diabetes: No  Lab Results  Component Value Date   HGBA1C 5.3 12/26/2019     How often do you need to have someone help you when you read instructions, pamphlets, or other written materials from your doctor or pharmacy?: 1 - Never What is the last grade level you completed in school?: Master's degree  Interpreter Needed?: No  Information entered by :: Lolita Libra, CMA   Activities of Daily Living     07/03/2024    8:15 PM 06/20/2024    1:20 PM  In your present state of health, do you have any difficulty performing the following activities:  Hearing? 0 0  Vision? 0 0  Difficulty concentrating or making decisions? 0 0  Walking or climbing stairs? 1 1  Dressing or bathing? 0 0  Doing errands, shopping? 0 0  Preparing Food and eating ? N   Using the Toilet? Y   Comment uses resources in the home.   In the past six months, have you accidently leaked urine? Y   Comment Feels like she gets busy and waits too long to go to the bathroom  over the last 3-4 years. Has discussed with urogyn.   Do you have problems with loss of bowel control? N   Managing your Medications? N   Managing your Finances? N   Housekeeping or managing your Housekeeping? Y     Patient Care Team: Amon Aloysius BRAVO, MD as PCP - Diedre Melodi Lerner, MD as Consulting Physician (Orthopedic Surgery) Odean Potts, MD as Consulting Physician (Hematology and Oncology) Gail Favorite, MD as Consulting Physician (General Surgery) Geronimo Amel, MD as Consulting Physician (Pulmonary Disease) Dewey Rush, MD as Consulting Physician (Radiation Oncology) Crawford, Morna Pickle, NP as Nurse Practitioner (Hematology and Oncology) Cam Morene ORN, MD  as Attending Physician (Urology) Carlie Clark, MD as Consulting Physician (Otolaryngology) Myra Zachary CROME. (Inactive) (Cardiology) Georgia Blonder, MD as Consulting Physician (Obstetrics and Gynecology) Walterine Mayans, MD as Referring Physician (Orthopedic Surgery)  I have updated your Care Teams any recent Medical Services you may have received from other providers in the past year.     Assessment:   This is a routine wellness examination for Cynthia Thornton.  Hearing/Vision screen Hearing Screening - Comments:: Denies hearing difficulties.  Vision Screening - Comments:: Up to date with routine exams with Christine Mccuen. Has bilateral cataracts.   Goals Addressed   None    Depression Screen     07/04/2024    8:46 AM 06/20/2024    1:20 PM 01/23/2024   10:54 AM 11/12/2023    2:56 PM 03/16/2023    7:58 AM 01/02/2023    1:50 PM 11/10/2022    9:03 AM  PHQ 2/9 Scores  PHQ - 2 Score 0 0 0 0 0 0 0  PHQ- 9 Score 3 0         Fall Risk     07/03/2024    8:15 PM 06/20/2024    1:20 PM 01/23/2024   10:51 AM 11/12/2023    2:56 PM 03/16/2023    7:58 AM  Fall Risk   Falls in the past year? 1 1 1  0 1  Number falls in past yr: 0 0 0 0 0  Injury with Fall? 1 0 1 0 1  Follow up  Falls evaluation completed;Education provided Falls evaluation completed;Education provided Falls evaluation completed;Education provided Falls evaluation completed    MEDICARE RISK AT HOME:  Medicare Risk at Home Any stairs in or around the home?: (Patient-Rptd) Yes If so, are there any without handrails?: (Patient-Rptd) No Home free of loose throw rugs in walkways, pet beds, electrical cords, etc?: (Patient-Rptd) Yes Adequate lighting in your home to reduce risk of falls?: (Patient-Rptd) Yes Life alert?: (Patient-Rptd) No Use of a cane, walker or w/c?: (Patient-Rptd) No Grab bars in the bathroom?: (Patient-Rptd) Yes Shower chair or bench in shower?: (Patient-Rptd) Yes Elevated toilet seat or a handicapped toilet?:  (Patient-Rptd) Yes  TIMED UP AND GO:  Was the test performed?  No, audio  Cognitive Function: 6CIT completed    12/08/2016    2:27 PM  MMSE - Mini Mental State Exam  Orientation to time 5   Orientation to Place 5   Registration 3   Attention/ Calculation 5   Recall 3   Language- name 2 objects 2   Language- repeat 1  Language- follow 3 step command 3   Language- read & follow direction 1   Write a sentence 1   Copy design 1   Total score 30      Data saved with a previous flowsheet row definition  06/26/2022    3:08 PM  6CIT Screen  What Year? 0 points  What month? 0 points  What time? 0 points  Count back from 20 0 points  Months in reverse 0 points  Repeat phrase 0 points  Total Score 0 points    Immunizations Immunization History  Administered Date(s) Administered   INFLUENZA, HIGH DOSE SEASONAL PF 07/05/2018, 08/02/2019, 08/15/2021, 07/29/2022   Influenza Split 08/31/2011, 07/30/2014   Influenza Whole 08/30/2009, 07/30/2010   Influenza,inj,Quad PF,6+ Mos 09/30/2012, 10/10/2013, 07/26/2015   Influenza-Unspecified 08/18/2016, 07/30/2020, 07/01/2023   Moderna Sars-Covid-2 Vaccination 11/29/2019, 12/29/2019, 10/14/2020, 05/19/2021, 03/17/2022   PNEUMOCOCCAL CONJUGATE-20 07/28/2022   Pneumococcal Conjugate-13 10/10/2013   Pneumococcal Polysaccharide-23 07/27/2015   Respiratory Syncytial Virus Vaccine,Recomb Aduvanted(Arexvy) 11/23/2022   Td 12/02/2008   Tdap 01/03/2019   Zoster Recombinant(Shingrix ) 01/03/2019, 04/18/2019   Zoster, Live 12/02/2008    Screening Tests Health Maintenance  Topic Date Due   Medicare Annual Wellness (AWV)  06/27/2023   COVID-19 Vaccine (6 - 2025-26 season) 07/20/2024 (Originally 06/30/2024)   Colonoscopy  12/21/2024 (Originally 05/28/2024)   Influenza Vaccine  01/27/2025 (Originally 05/30/2024)   DTaP/Tdap/Td (3 - Td or Tdap) 01/02/2029   Pneumococcal Vaccine: 50+ Years  Completed   DEXA SCAN  Completed   Hepatitis C  Screening  Completed   Zoster Vaccines- Shingrix   Completed   HPV VACCINES  Aged Out   Meningococcal B Vaccine  Aged Out    Health Maintenance  Health Maintenance Due  Topic Date Due   Medicare Annual Wellness (AWV)  06/27/2023   Health Maintenance Items Addressed: Has colonoscopy scheduled 08/03/24, Declines CoVID vaccines.    Additional Screening:  Vision Screening: Recommended annual ophthalmology exams for early detection of glaucoma and other disorders of the eye. Would you like a referral to an eye doctor? No    Dental Screening: Recommended annual dental exams for proper oral hygiene  Community Resource Referral / Chronic Care Management: CRR required this visit?  No   CCM required this visit?  No   Plan:    I have personally reviewed and noted the following in the patient's chart:   Medical and social history Use of alcohol, tobacco or illicit drugs  Current medications and supplements including opioid prescriptions. Patient is not currently taking opioid prescriptions. Functional ability and status Nutritional status Physical activity Advanced directives List of other physicians Hospitalizations, surgeries, and ER visits in previous 12 months Vitals Screenings to include cognitive, depression, and falls Referrals and appointments  In addition, I have reviewed and discussed with patient certain preventive protocols, quality metrics, and best practice recommendations. A written personalized care plan for preventive services as well as general preventive health recommendations were provided to patient.   Lolita Libra, CMA   07/04/2024   After Visit Summary: (MyChart) Due to this being a telephonic visit, the after visit summary with patients personalized plan was offered to patient via MyChart   Notes: see phone note

## 2024-07-04 NOTE — Telephone Encounter (Signed)
 FYI:  Pt had AWV today. Reports bilateral cataracts and I added that to her problem list.    Also reports total spinal fusion (added to surgical history) with Dr Walterine in [place] and has significant lifestyle changes since surgery and continues to have daily pain but has a positive attitude.

## 2024-07-04 NOTE — Patient Instructions (Addendum)
 Cynthia Thornton , Thank you for taking time out of your busy schedule to complete your Annual Wellness Visit with me. I enjoyed our conversation and look forward to speaking with you again next year. I, as well as your care team,  appreciate your ongoing commitment to your health goals. Please review the following plan we discussed and let me know if I can assist you in the future. Your Game plan/ To Do List   I pray for continued healing for you since your spinal surgery!  Follow up Visits: Next Medicare AWV with our clinical staff: 07/10/25 8:20am, telephone.    Next Office Visit with your provider: 06/24/25 10am, Dr Amon.  Clinician Recommendations:  Aim for 30 minutes of exercise or brisk walking, 6-8 glasses of water, and 5 servings of fruits and vegetables each day.   You will need to get the following vaccines at your local pharmacy:  Flu      This is a list of the screening recommended for you and due dates:  Health Maintenance  Topic Date Due   Medicare Annual Wellness Visit  06/27/2023   COVID-19 Vaccine (6 - 2025-26 season) 06/30/2024   Colon Cancer Screening  12/21/2024*   Flu Shot  01/27/2025*   DTaP/Tdap/Td vaccine (3 - Td or Tdap) 01/02/2029   Pneumococcal Vaccine for age over 74  Completed   DEXA scan (bone density measurement)  Completed   Hepatitis C Screening  Completed   Zoster (Shingles) Vaccine  Completed   HPV Vaccine  Aged Out   Meningitis B Vaccine  Aged Out  *Topic was postponed. The date shown is not the original due date.    Advanced directives: (Copy Requested) Please bring a copy of your health care power of attorney and living will to the office to be added to your chart at your convenience. You can mail to [facility] [address] [place], [place] [address] or email to [email or web address] Advance Care Planning is important because it:  [x]  Makes sure you receive the medical care that is consistent with your values, goals, and  preferences  [x]  It provides guidance to your family and loved ones and reduces their decisional burden about whether or not they are making the right decisions based on your wishes.  Follow the link provided in your after visit summary or read over the paperwork we have mailed to you to help you started getting your Advance Directives in place. If you need assistance in completing these, please reach out to us  so that we can help you!  See attachments for Preventive Care and Fall Prevention Tips.

## 2024-07-08 ENCOUNTER — Encounter: Payer: Self-pay | Admitting: Primary Care

## 2024-07-08 ENCOUNTER — Ambulatory Visit: Admitting: Primary Care

## 2024-07-08 VITALS — BP 122/64 | HR 63 | Temp 97.4°F | Ht 64.0 in | Wt 145.4 lb

## 2024-07-08 DIAGNOSIS — J479 Bronchiectasis, uncomplicated: Secondary | ICD-10-CM | POA: Diagnosis not present

## 2024-07-08 NOTE — Patient Instructions (Signed)
 VISIT SUMMARY: You came in for a follow-up visit after experiencing a significant episode of coughing up blood, related to your bronchiectasis. You were recently treated with a seven-day course of Levaquin  for an acute exacerbation. You had multiple episodes of coughing up blood on August 31st and September 1st, but have not had any further episodes since then. You mentioned a possible trigger being exposure to dust after climbing stairs.  YOUR PLAN: -BRONCHIECTASIS WITH RECENT ACUTE EXACERBATION AND HEMOPTYSIS: Bronchiectasis is a condition where the airways in your lungs become damaged and widened, leading to mucus build-up and infections. You recently had an acute exacerbation, which means a sudden worsening of your symptoms, including coughing up blood. You were treated with a seven-day course of Levaquin , an antibiotic. Since your last episode of coughing up blood on September 1st, you have not had any further symptoms. We will consult with Dr. Ruther to determine if you need a chest CT scan to further investigate your condition.  INSTRUCTIONS: I will follow-up with Dr. Shelah regarding the need for a chest CT scan. Continue to monitor your symptoms and avoid exposure to dust and other potential triggers. If you experience any new or worsening symptoms, please contact our office immediately.  Follow-up 6 months with Dr. Shelah or sooner if needed    Long-term Lung Airway Damage (Bronchiectasis): What to Know  Bronchiectasis happens when the upper airways in the lungs called bronchi, have long-term damage. This causes the airways to get larger and makes it hard for the lungs to get rid of mucus. Bronchiectasis may lead to lung infections, which can make bronchiectasis worse. What are the causes? You can be born with bronchiectasis, or you can develop it later in life. Common causes include: Cystic fibrosis. Frequent lung infections, such as pneumonia. An object or other blockage in the  lungs. Breathing in fluid, food, or other objects. A problem with your lung structures that are present at birth. A problem with the body's defense system (immune system). Sometimes, the cause is not known. What are the signs or symptoms? A daily cough. This cough may: Bring up mucus. Last for more than 3 weeks. Frequent lung infections. Shortness of breath. Wheezing. This is a high-pitched whistling sound made when you breathe, most often when you breathe out. Weakness and feeling tired. How is this diagnosed? Blood tests. Chest X-rays. CT scans. Breathing tests. These are done to check how well your lungs are working. A test of a sample of your mucus. This test is done to check for infection. How is this treated? Treatment depends on how bad the problem is and its cause. Treatment may include: Medicines that loosen mucus so it can be coughed up. Medicines that relax the muscles of the bronchi. Antibiotics to prevent or treat infection. Physical therapy to help clear mucus from the lungs, such as: Postural drainage. This is when you sit or lie in certain positions so that mucus can drain by gravity. Chest percussion. This involves tapping the chest or back with a cupped hand. Chest vibration. With this a hand or a device like a vest, vibrates your chest and back. Surgery to remove the damaged part of the lung. Follow these instructions at home: Medicines Take your medicines only as told. If you were given antibiotics, take them as told. Do not stop taking them even if you start to feel better. Avoid taking sedatives and antihistamines medicines unless your health care provider tells you to take them. These medicines may thicken  the mucus in the lungs. Managing symptoms Do breathing exercises or techniques to clear your lungs as told by your provider. Consider using a cold steam vaporizer or humidifier in your room or home to help loosen mucus. If you have a cough that gets worse  at night, try sleeping with your head higher than your body. General instructions Get lots of rest. Drink more fluids as told. Stay inside when pollution and ozone levels are high. Stay up to date with shots (vaccines). Avoid cigarette smoke and other lung irritants. Do not smoke, vape, or use nicotine or tobacco. Contact a health care provider if: You cough up more mucus than before or your mucus changes color. You have blood streaks in your mucus. You have a fever or chills. You can't control your cough. Get help right away if: You cough up blood. You have chest pain. You have trouble breathing. You have a fever and your symptoms suddenly get worse. These symptoms may be an emergency. Call 911 right away. Do not wait to see if the symptoms will go away. Do not drive yourself to the hospital. This information is not intended to replace advice given to you by your health care provider. Make sure you discuss any questions you have with your health care provider. Document Revised: 12/10/2023 Document Reviewed: 12/10/2023 Elsevier Patient Education  2025 [employer].

## 2024-07-08 NOTE — Progress Notes (Signed)
 @Patient  ID: Cynthia Thornton, female    DOB: June 12, 1946, 78 y.o.   MRN: [id number]  Chief Complaint  Patient presents with   Medical Management of Chronic Issues    F/u per RB    Referring provider: Amon Aloysius BRAVO, MD  HPI: [name] is 68, former smoker, who is followed by Dr. Geronimo and our office for bronchiectasis and chronic lingular scarring.  She also has a history of left breast cancer, eczema, esophageal strictures, GERD with a hiatal hernia.   Previous [facility] pulmonary encounter  06/26/24- Acute OV, Dr. Shelah  Acute onset small-volume hemoptysis.  She has not had significant cough or sputum production that would be consistent with an exacerbation of her bronchiectasis.  That said reasonable to treat as such.  Will give her levofloxacin  for 7 days.  Question whether she may have upper airway strain/injury, consider AVM, etc.  I will check a chest x-ray now to rule out any new parenchymal finding.  If the cough, bleeding continues then she needs a more extensive workup that will include a CT chest, possible evaluation for thromboembolic disease, possible bronchoscopy.  She will follow-up in 7 to 10 days so we can continue the evaluation as needed.  07/08/2024- Interim hx  Discussed the use of AI scribe software for clinical note transcription with the patient, who gave verbal consent to proceed.  History of Present Illness Cynthia Thornton is a 78 year old female with bronchiectasis who presents for follow-up after a recent exacerbation.  She has a history of bronchiectasis and was recently treated for an acute exacerbation with a seven-day course of Levaquin .  She experienced a significant episode of hemoptysis, described as the worst she has ever had. Her first episode of hemoptysis occurred in 2010, typically occurring once every year or two, but she had not had an episode since before the COVID-19 pandemic. This recent episode was different, with increased volume and multiple occurrences  over two days, specifically on August 31st and September 1st. Her episodes are typically 'one and done.'  Following the initial episode, she was symptom-free for two days while on antibiotics, but then experienced hemoptysis again on Sunday, August 31st, and Monday, September 1st, coughing up blood multiple times, with the last occurrence on Monday afternoon. Since then, she has been clear of symptoms.  On Thursday, September 4th, she coughed up small strings of brown blood, described as 'leftover' and similar to past experiences. She has not had any further episodes since then.  No recent illness or exposure to sick contacts prior to the exacerbation. She recalls a raw sensation in her chest after climbing stairs and entering a dusty room, which preceded the hemoptysis by about 30 seconds to two minutes. No difficulty breathing or chest pain during this time.  She is not on any blood thinners and has not experienced any further symptoms since the initial episode.   Allergies  Allergen Reactions   Doxycycline  Rash   Other Other (See Comments)    Surgical glue : hives   Oxycodone  Nausea And Vomiting   Sulfa  Antibiotics Hives   Trimethoprim  Sulfate [Trimethoprim ]     rash   Codeine Nausea Only   Propranolol Hcl Other (See Comments)    Dizziness and heart racing    Immunization History  Administered Date(s) Administered   INFLUENZA, HIGH DOSE SEASONAL PF 07/05/2018, 08/02/2019, 08/15/2021, 07/29/2022   Influenza Split 08/31/2011, 07/30/2014   Influenza Whole 08/30/2009, 07/30/2010   Influenza,inj,Quad PF,6+ Mos 09/30/2012, 10/10/2013,  07/26/2015   Influenza-Unspecified 08/18/2016, 07/30/2020, 07/01/2023   Moderna Sars-Covid-2 Vaccination 11/29/2019, 12/29/2019, 10/14/2020, 05/19/2021, 03/17/2022   PNEUMOCOCCAL CONJUGATE-20 07/28/2022   Pneumococcal Conjugate-13 10/10/2013   Pneumococcal Polysaccharide-23 07/27/2015   Respiratory Syncytial Virus Vaccine,Recomb Aduvanted(Arexvy)  11/23/2022   Td 12/02/2008   Tdap 01/03/2019   Zoster Recombinant(Shingrix ) 01/03/2019, 04/18/2019   Zoster, Live 12/02/2008    Past Medical History:  Diagnosis Date   Annual physical exam 07/27/2015   Anxiety 03/29/2019   Arthritis    Bilateral cataracts    Breast cancer, left [facility]) oncologist-- dr odean   dx 09-26-2017--- IDC, Stage IA, Grade 2, ER/PR +;  HER2 negative;  s/p  breast lumpectomy w/ node dissection 11-02-2017;  completed radiation 01-09-2018 (left breast genetic screening panel 2017 with variant of unknown significance AXINA)   Bronchiectasis (HCC)    pulmologist-- dr rosiland-- w/ chronic lingular scarring  (last exceratbation bronchiectasis 07/ 2020)   Change in bowel habits 09/10/2014   COLONIC POLYPS, BENIGN 01/16/2008   Qualifier: Diagnosis of   By: Lewellyn CMA (AAMA), Amanda         DEGENERATIVE JOINT DISEASE 01/16/2008   Qualifier: Diagnosis of   By: Lewellyn CMA (AAMA), Amanda      Replacing diagnoses that were inactivated after the 01/29/23 regulatory import     Dyslipidemia    followed by cardiology/ vascular-- dr g. adams ([facility] in [place])  family history strokes   Eczema    ESOPHAGEAL STRICTURE 04/05/2007   Annotation: per EGD  Qualifier: Diagnosis of   By: Amon MD, Aloysius BRAVO.        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  The Comprehensive Cancer Panel offered by [facility] includes sequencing and/or deletion duplication testing of the following 32 genes: APC, ATM, AXIN2, BARD1, BMPR1A, BRCA1, BRCA2, BRIP1, CDH1, CDK4, CDKN2A, CHEK2, EPCAM, FANCC, MLH1, MSH2, MSH6, MUTYH, NBN, PALB2, PMS2, POLD1, POLE, PTEN, RAD51C, RAD51D, SCG5/GREM1, SMAD4, STK1   GERD 07/30/2006   Qualifier: Diagnosis of   By: Amon MD, Aloysius CHARLENA SALIVA, MULTINODULAR 11/27/2008   Qualifier: Diagnosis of   By: Kassie MD, Sean A        HIATAL HERNIA 08/15/2006   Qualifier: Diagnosis of   By: Earlean CMA (AAMA), Amanda      Replacing diagnoses that were inactivated after the 01/29/23 regulatory import     History of esophageal stricture    s/p dilatation 2008   History of external beam radiation therapy    left breast 12-19-2017  to 01-09-2018   History of hyperthyroidism    endocrinologist-- (lov 04-11-2019 epic) dr kassie, dx 2009 due to multinodular goiter ,  had taken medication for few months 2010 stopped due to norma TFT;     Hypercholesteremia 10/03/2010   Overview:   Chronic [Hypercholesterolemia]     Insomnia    Irritation of left eye 02/07/2022   Malignant neoplasm of lower-inner quadrant of left breast in female, estrogen receptor positive (HCC) 10/02/2017   Osteopenia 02/2018   T score -2.4 distal third of radius.  FRAX 11% / 1.8% stable at other points of interest to include spine, right and left hip   Osteoporosis  2025 severe -3.3 h/o fracture with fall.   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   Smoking history 01/15/2015   Thyrotoxicosis 10/22/2008   Qualifier: Diagnosis of   By: Tish MD, William         Tinnitus 06/27/2019   UNSPECIFIED ANEMIA 11/02/2008   Qualifier: Diagnosis of   By: Tish MD, Elsie         Wears contact lenses     Tobacco History: Social History   Tobacco Use  Smoking Status Former   Current packs/day: 0.00   Average packs/day: 0.1 packs/day for 30.0 years (3.0 ttl pk-yrs)   Types: Cigarettes   Start date: 10/30/1965   Quit date: 10/31/1995   Years since quitting: 28.7  Smokeless Tobacco Never   Counseling given: Not Answered   Outpatient Medications Prior to Visit  Medication Sig  Dispense Refill   acetaminophen  (TYLENOL ) 500 MG tablet Take 1,000 mg by mouth every 8 (eight) hours as needed.     cetirizine (ZYRTEC) 10 MG tablet Take 10 mg by mouth daily.     Cholecalciferol (VITAMIN D ) 50 MCG (2000 UT) CAPS Take 1 each by mouth daily.     clorazepate  (TRANXENE ) 7.5 MG tablet TAKE 1 TABLET (7.5 MG TOTAL) BY MOUTH AT BEDTIME AS NEEDED FOR ANXIETY. 30 tablet 1   estradiol  (ESTRACE ) 0.1 MG/GM vaginal cream Place 0.5 g vaginally 2 (two) times a week. Place 0.5g nightly twice a week 42.5 g 11   gabapentin  (NEURONTIN ) 300 MG capsule Take 1 capsule (300 mg total) by mouth 2 (two) times daily.     methenamine  (HIPREX ) 1 g tablet Take 1 g by mouth 2 (two) times daily.     omeprazole  (PRILOSEC) 40 MG capsule Take 1 capsule (40 mg total) by mouth daily. Patient needs follow up appointment for future refills. Please call [phone] to schedule an appointment. 90 capsule 3   ondansetron  (ZOFRAN -ODT) 4 MG disintegrating tablet TAKE 1 TABLET BY MOUTH EVERY 8 HOURS AS NEEDED FOR NAUSEA OR VOMITING 30 tablet 0   Probiotic Product (PROBIOTIC PO) Take 1 tablet by mouth daily.     traMADol  (ULTRAM ) 50 MG tablet Take 50 mg by mouth every 6 (six) hours as needed.     valACYclovir  (VALTREX ) 500 MG tablet TAKE 1 TABLET (500 MG TOTAL) BY MOUTH DAILY. 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 90 tablet 4   Facility-Administered Medications Prior to Visit  Medication Dose Route Frequency Provider Last Rate Last Admin   denosumab  (PROLIA ) injection 60 mg  60 mg Subcutaneous Once Boswell, Ka Flammer K, MD        Review of Systems  Review of Systems  Constitutional:  Negative for fever.  Respiratory:  Negative for cough and shortness of breath.      Physical Exam  BP 122/64   Pulse 63   Temp (!) 97.4 F (36.3 C)   Ht 5' 4 (1.626 m)   Wt 145 lb 6.4 oz (66 kg)   SpO2 99% Comment: RA  BMI 24.96 kg/m  Physical Exam Constitutional:      Appearance: Normal appearance.  HENT:     Head: Normocephalic and  atraumatic.  Cardiovascular:     Rate and Rhythm: Normal rate and regular rhythm.  Pulmonary:     Effort: Pulmonary effort is  normal.     Breath sounds: Normal breath sounds. No wheezing, rhonchi or rales.  Musculoskeletal:        General: Normal range of motion.  Skin:    General: Skin is warm and dry.     Findings: No erythema.  Neurological:     General: No focal deficit present.     Mental Status: She is alert and oriented to person, place, and time. Mental status is at baseline.  Psychiatric:        Mood and Affect: Mood normal.        Behavior: Behavior normal.        Thought Content: Thought content normal.        Judgment: Judgment normal.     Lab Results:  CBC    Component Value Date/Time   WBC 6.4 06/20/2024 1414   RBC 3.83 06/20/2024 1414   HGB 12.5 06/20/2024 1414   HGB 13.0 10/03/2017 0837   HCT 36.3 06/20/2024 1414   HCT 37.6 10/03/2017 0837   PLT 325 06/20/2024 1414   PLT 305 10/03/2017 0837   MCV 94.8 06/20/2024 1414   MCV 88.3 10/03/2017 0837   MCH 32.6 06/20/2024 1414   MCHC 34.4 06/20/2024 1414   RDW 13.8 06/20/2024 1414   RDW 13.4 10/03/2017 0837   LYMPHSABS 1.2 01/23/2024 1129   LYMPHSABS 1.5 10/03/2017 0837   MONOABS 0.5 01/23/2024 1129   MONOABS 0.5 10/03/2017 0837   EOSABS 147 06/20/2024 1414   EOSABS 0.2 10/03/2017 0837   BASOSABS 58 06/20/2024 1414   BASOSABS 0.1 10/03/2017 0837    BMET    Component Value Date/Time   NA 136 05/16/2024 0904   NA 136 (A) 10/09/2022 0000   NA 134 (L) 10/03/2017 0837   K 4.0 05/16/2024 0904   K 4.3 10/03/2017 0837   CL 99 05/16/2024 0904   CO2 26 05/16/2024 0904   CO2 23 10/03/2017 0837   GLUCOSE 87 05/16/2024 0904   GLUCOSE 90 10/03/2017 0837   BUN 14 05/16/2024 0904   BUN 16 10/09/2022 0000   BUN 24.3 10/03/2017 0837   CREATININE 0.80 05/16/2024 0904   CREATININE 1.0 10/03/2017 0837   CALCIUM  9.7 05/16/2024 0904   CALCIUM  9.7 10/03/2017 0837   GFRNONAA >60 05/18/2020 1147   GFRAA >60  05/18/2020 1147    BNP No results found for: BNP  ProBNP No results found for: PROBNP  Imaging: DG Chest 2 View Result Date: 06/26/2024 CLINICAL DATA:  Hemoptysis. EXAM: CHEST - 2 VIEW COMPARISON:  April 27, 2020. FINDINGS: The heart size and mediastinal contours are within normal limits. Both lungs are clear. Status post surgical posterior fusion of the thoracic and visualized lumbar spine. IMPRESSION: No active cardiopulmonary disease. Electronically Signed   By: Lynwood Landy Raddle M.D.   On: 06/26/2024 16:28   DG Bone Density Result Date: 06/20/2024 EXAM: DUAL X-RAY ABSORPTIOMETRY (DXA) FOR BONE MINERAL DENSITY 06/19/2024 2:23 pm CLINICAL DATA:  78 year old Female Postmenopausal. TECHNIQUE: An axial (e.g., hips, spine) and/or appendicular (e.g., radius) exam was performed, as appropriate, using GE Secretary/administrator at [employer]. Images are obtained for bone mineral density measurement and are not obtained for diagnostic purposes. [id number] Exclusions: Lumbar spine. COMPARISON:  None. New baseline. FINDINGS: Scan quality: Good. LEFT FEMORAL NECK: BMD (in g/cm2): 0.779 T-score: -1.9 Z-score: 0.1 LEFT TOTAL HIP: BMD (in g/cm2): 0.804 T-score: -1.6 Z-score: 0.2 RIGHT FEMORAL NECK: BMD (in g/cm2): 0.691 T-score: -2.5 Z-score: -0.5  RIGHT TOTAL HIP: BMD (in g/cm2): 0.755 T-score: -2.0 Z-score: -0.2 LEFT FOREARM (RADIUS 33%): BMD (in g/cm2): 0.586 T-score: -3.3 Z-score: -0.9 FRAX 10-YEAR PROBABILITY OF FRACTURE: FRAX not reported as the lowest BMD is not in the osteopenia range. IMPRESSION: Osteoporosis based on BMD. Fracture risk is increased. RECOMMENDATIONS: 1. All patients should optimize calcium  and vitamin D  intake. 2. Consider FDA-approved medical therapies in postmenopausal women and men aged 52 years and older, based on the following: - A hip or vertebral (clinical or morphometric) fracture - T-score less than or equal to -2.5 and secondary causes have been  excluded. - Low bone mass (T-score between -1.0 and -2.5) and a 10-year probability of a hip fracture greater than or equal to 3% or a 10-year probability of a major osteoporosis-related fracture greater than or equal to 20% based on the [place] -adapted WHO algorithm. - Clinician judgment and/or patient preferences may indicate treatment for people with 10-year fracture probabilities above or below these levels 3. Patients with diagnosis of osteoporosis or at high risk for fracture should have regular bone mineral density tests. For patients eligible for Medicare, routine testing is allowed once every 2 years. The testing frequency can be increased to one year for patients who have rapidly progressing disease, those who are receiving or discontinuing medical therapy to restore bone mass, or have additional risk factors. Electronically Signed   By: Reyes Phi M.D.   On: 06/20/2024 13:35     Assessment & Plan:    1. Bronchiectasis without complication (HCC) (Primary)  Assessment and Plan Assessment & Plan Bronchiectasis with recent acute exacerbation and hemoptysis Acute exacerbation of bronchiectasis with hemoptysis treated with a 7-day course of Levaquin . Initial chest x-ray showed no acute process. Hemoptysis episodes occurred on August 31st and September 1st. No further episodes since September 1st. The recent exacerbation was more severe than previous episodes, with increased volume and frequency of hemoptysis. No recent illness or exposure to sick contacts reported. Possible trigger identified as exposure to dust after climbing stairs. No current respiratory distress or chest pain. Not on anticoagulants. - Consult with Dr. Shelah regarding the need for a chest CT.    Almarie LELON Ferrari, NP 07/08/2024

## 2024-07-11 DIAGNOSIS — S32049A Unspecified fracture of fourth lumbar vertebra, initial encounter for closed fracture: Secondary | ICD-10-CM | POA: Diagnosis not present

## 2024-07-25 DIAGNOSIS — M4325 Fusion of spine, thoracolumbar region: Secondary | ICD-10-CM | POA: Diagnosis not present

## 2024-07-25 DIAGNOSIS — T84226A Displacement of internal fixation device of vertebrae, initial encounter: Secondary | ICD-10-CM | POA: Diagnosis not present

## 2024-08-01 ENCOUNTER — Encounter: Payer: Self-pay | Admitting: Internal Medicine

## 2024-08-08 ENCOUNTER — Ambulatory Visit: Admitting: Internal Medicine

## 2024-08-08 ENCOUNTER — Encounter: Payer: Self-pay | Admitting: Internal Medicine

## 2024-08-08 VITALS — BP 110/58 | HR 72 | Temp 98.1°F | Resp 19 | Ht 62.5 in | Wt 140.0 lb

## 2024-08-08 DIAGNOSIS — Z1211 Encounter for screening for malignant neoplasm of colon: Secondary | ICD-10-CM

## 2024-08-08 DIAGNOSIS — Z860100 Personal history of colon polyps, unspecified: Secondary | ICD-10-CM | POA: Diagnosis not present

## 2024-08-08 DIAGNOSIS — Z860101 Personal history of adenomatous and serrated colon polyps: Secondary | ICD-10-CM

## 2024-08-08 DIAGNOSIS — E7800 Pure hypercholesterolemia, unspecified: Secondary | ICD-10-CM | POA: Diagnosis not present

## 2024-08-08 DIAGNOSIS — F419 Anxiety disorder, unspecified: Secondary | ICD-10-CM | POA: Diagnosis not present

## 2024-08-08 DIAGNOSIS — Z800 Family history of malignant neoplasm of digestive organs: Secondary | ICD-10-CM

## 2024-08-08 DIAGNOSIS — K648 Other hemorrhoids: Secondary | ICD-10-CM | POA: Diagnosis not present

## 2024-08-08 MED ORDER — SODIUM CHLORIDE 0.9 % IV SOLN: 500.0000 mL | INTRAVENOUS | Status: DC

## 2024-08-08 NOTE — Op Note (Signed)
 [facility] Patient Name: Cynthia Thornton Procedure Date: 08/08/2024 11:49 AM MRN: [id number] Endoscopist: Gordy CHRISTELLA Starch , MD, [id number] Age: 78 Referring MD:  Date of Birth: Mar 18, 1946 Gender: Female Account #: [id number] Procedure:                Colonoscopy Indications:              High risk colon cancer surveillance: Personal                            history of sessile serrated colon polyp (less than                            10 mm in size) with no dysplasia, Family history of                            colon cancer in a first-degree relative before                            after 60 years, Last colonoscopy: July 2020                            (normal), Jan 2016 (Jan 2016) Medicines:                Monitored Anesthesia Care Procedure:                Pre-Anesthesia Assessment:                           - Prior to the procedure, a History and Physical                            was performed, and patient medications and                            allergies were reviewed. The patient's tolerance of                            previous anesthesia was also reviewed. The risks                            and benefits of the procedure and the sedation                            options and risks were discussed with the patient.                            All questions were answered, and informed consent                            was obtained. Prior Anticoagulants: The patient has                            taken no anticoagulant or antiplatelet agents. ASA  Grade Assessment: II - A patient with mild systemic                            disease. After reviewing the risks and benefits,                            the patient was deemed in satisfactory condition to                            undergo the procedure.                           After obtaining informed consent, the colonoscope                            was passed under direct vision.  Throughout the                            procedure, the patient's blood pressure, pulse, and                            oxygen saturations were monitored continuously. The                            Olympus Scope [id number] was introduced through the                            anus and advanced to the cecum, identified by                            appendiceal orifice and ileocecal valve. The                            colonoscopy was performed without difficulty. The                            patient tolerated the procedure well. The quality                            of the bowel preparation was good. The ileocecal                            valve, appendiceal orifice, and rectum were                            photographed. Scope In: 12:09:37 PM Scope Out: 12:26:49 PM Scope Withdrawal Time: 0 hours 12 minutes 21 seconds  Total Procedure Duration: 0 hours 17 minutes 12 seconds  Findings:                 The digital rectal exam was normal.                           The colon (entire examined portion) appeared normal.  Internal hemorrhoids were found during                            retroflexion. The hemorrhoids were small. Complications:            No immediate complications. Estimated Blood Loss:     Estimated blood loss: none. Impression:               - The entire examined colon is normal.                           - Small internal hemorrhoids.                           - No specimens collected. Recommendation:           - Patient has a contact number available for                            emergencies. The signs and symptoms of potential                            delayed complications were discussed with the                            patient. Return to normal activities tomorrow.                            Written discharge instructions were provided to the                            patient.                           - Resume previous diet.                            - Continue present medications.                           - Repeat colonoscopy in 5 years for surveillance. Gordy CHRISTELLA Starch, MD 08/08/2024 12:29:44 PM This report has been signed electronically.

## 2024-08-08 NOTE — Progress Notes (Signed)
 To pacu, VSS. Report to [email or web address].tb

## 2024-08-08 NOTE — Progress Notes (Signed)
 GASTROENTEROLOGY PROCEDURE H&P NOTE   Primary Care Physician: Amon Aloysius BRAVO, MD    Reason for Procedure:  Personal history of colonic polyp and family history of colon cancer  Plan:    Colonoscopy  Patient is appropriate for endoscopic procedure(s) in the ambulatory ([facility]) setting.  The nature of the procedure, as well as the risks, benefits, and alternatives were carefully and thoroughly reviewed with the patient. Ample time for discussion and questions allowed. The patient understood, was satisfied, and agreed to proceed.     HPI: Cynthia Thornton is a 78 y.o. female who presents for surveillance colonoscopy.  Medical history as below.  Tolerated the prep.  No recent chest pain or shortness of breath.  No abdominal pain today.  Past Medical History:  Diagnosis Date   Annual physical exam 07/27/2015   Anxiety 03/29/2019   Arthritis    Bilateral cataracts    Breast cancer, left [facility]) oncologist-- dr odean   dx 09-26-2017--- IDC, Stage IA, Grade 2, ER/PR +;  HER2 negative;  s/p  breast lumpectomy w/ node dissection 11-02-2017;  completed radiation 01-09-2018 (left breast genetic screening panel 2017 with variant of unknown significance AXINA)   Bronchiectasis (HCC)    pulmologist-- dr rosiland-- w/ chronic lingular scarring  (last exceratbation bronchiectasis 07/ 2020)   Change in bowel habits 09/10/2014   COLONIC POLYPS, BENIGN 01/16/2008   Qualifier: Diagnosis of   By: Lewellyn CMA (AAMA), Amanda         DEGENERATIVE JOINT DISEASE 01/16/2008   Qualifier: Diagnosis of   By: Lewellyn CMA (AAMA), Amanda      Replacing diagnoses that were inactivated after the 01/29/23 regulatory import     Dyslipidemia    followed by cardiology/ vascular-- dr g. adams ([facility] in [place])  family history strokes   Eczema    ESOPHAGEAL STRICTURE 04/05/2007   Annotation: per EGD  Qualifier: Diagnosis of   By: Amon MD, Aloysius BRAVO.        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  The Comprehensive Cancer Panel offered by [facility] includes sequencing and/or deletion duplication testing of the following 32 genes: APC, ATM, AXIN2, BARD1, BMPR1A, BRCA1, BRCA2, BRIP1, CDH1, CDK4, CDKN2A, CHEK2, EPCAM, FANCC, MLH1, MSH2, MSH6, MUTYH, NBN, PALB2, PMS2, POLD1, POLE, PTEN, RAD51C, RAD51D, SCG5/GREM1, SMAD4, STK1   GERD 07/30/2006   Qualifier: Diagnosis of   By: Amon MD, Aloysius CHARLENA SALIVA, MULTINODULAR 11/27/2008   Qualifier: Diagnosis of   By: Kassie MD, Sean A        HIATAL HERNIA 08/15/2006   Qualifier: Diagnosis of   By: Earlean CMA (AAMA), Amanda      Replacing diagnoses that were inactivated after the 01/29/23 regulatory import     History of esophageal stricture    s/p dilatation 2008   History of external beam radiation therapy    left breast 12-19-2017  to 01-09-2018   History of hyperthyroidism    endocrinologist-- (lov 04-11-2019 epic) dr kassie, dx 2009 due to multinodular goiter ,  had taken medication for few months 2010 stopped due to norma TFT;     Hypercholesteremia 10/03/2010   Overview:   Chronic [  Hypercholesterolemia]     Insomnia    Irritation of left eye 02/07/2022   Malignant neoplasm of lower-inner quadrant of left breast in female, estrogen receptor positive (HCC) 10/02/2017   Osteopenia 02/2018   T score -2.4 distal third of radius.  FRAX 11% / 1.8% stable at other points of interest to include spine, right and left hip   Osteoporosis    2025 severe -3.3 h/o fracture with fall.   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   Smoking history  01/15/2015   Thyrotoxicosis 10/22/2008   Qualifier: Diagnosis of   By: Tish MD, William         Tinnitus 06/27/2019   UNSPECIFIED ANEMIA 11/02/2008   Qualifier: Diagnosis of   By: Tish MD, Elsie         Wears contact lenses     Past Surgical History:  Procedure Laterality Date   BREAST LUMPECTOMY WITH RADIOACTIVE SEED AND SENTINEL LYMPH NODE BIOPSY Left 11/02/2017   Procedure: LEFT BREAST LUMPECTOMY WITH RADIOACTIVE SEED AND LEFT AXILLARY DEEP SENTINEL LYMPH NODE BIOPSY, INJECT BLUE DYE LEFT BREAST;  Surgeon: Gail Favorite, MD;  Location: [facility];  Service: General;  Laterality: Left;   BREAST SURGERY  2000   Breast cyst removed, left   LAPAROSCOPIC BILATERAL SALPINGO OOPHERECTOMY Bilateral 10/13/2019   Procedure: LAPAROSCOPIC BILATERAL SALPINGO OOPHORECTOMY LYSIS OF ADHESIONS PERITONEAL WASHINGS ;  Surgeon: Lavoie, Marie-Lyne, MD;  Location: [facility] ;  Service: Gynecology;  Laterality: Bilateral;  request 7:30am OR time in [place] [facility] block requests one hour   SPINAL FUSION  12/03/2023   Dr Walterine Persons   TONSILLECTOMY  age 58   TUBAL LIGATION Bilateral yrs ago   VAGINAL HYSTERECTOMY  1991   Right ovarian cystectomy   VIDEO BRONCHOSCOPY Bilateral 03/16/2015   Procedure: VIDEO BRONCHOSCOPY WITHOUT FLUORO;  Surgeon: Dorethia Cave, MD;  Location: [facility];  Service: Endoscopy;  Laterality: Bilateral;    Prior to Admission medications   Medication Sig Start Date End Date Taking? Authorizing Provider  acetaminophen  (TYLENOL ) 500 MG tablet Take 1,000 mg by mouth every 8 (eight) hours as needed.   Yes [provider]  cetirizine (ZYRTEC) 10 MG tablet Take 10 mg by mouth daily.   Yes [provider]  Cholecalciferol (VITAMIN D ) 50 MCG (2000 UT) CAPS Take 1 each by mouth daily.   Yes [provider]  clorazepate  (TRANXENE ) 7.5 MG tablet TAKE 1 TABLET (7.5 MG TOTAL) BY MOUTH AT BEDTIME AS NEEDED FOR ANXIETY.  04/18/24  Yes [name], Jose E, MD  gabapentin  (NEURONTIN ) 300 MG capsule Take 1 capsule (300 mg total) by mouth 2 (two) times daily. 06/20/24  Yes [name], Aloysius BRAVO, MD  methenamine  (HIPREX ) 1 g tablet Take 1 g by mouth 2 (two) times daily.   Yes [provider]  omeprazole  (PRILOSEC) 40 MG capsule Take 1 capsule (40 mg total) by mouth daily. Patient needs follow up appointment for future refills. Please call [phone] to schedule an appointment. 05/09/24 05/04/25 Yes [place], PA-C  Probiotic Product (PROBIOTIC PO) Take 1 tablet by mouth daily.   Yes [provider]  traMADol  (ULTRAM ) 50 MG tablet Take 50 mg by mouth every 6 (six) hours as needed. 04/25/24  Yes [provider]  valACYclovir  (VALTREX ) 500  MG tablet TAKE 1 TABLET (500 MG TOTAL) BY MOUTH DAILY. 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 09/19/23  Yes Hines, Genesis V, MD  estradiol  (ESTRACE ) 0.1 MG/GM vaginal cream Place 0.5 g vaginally 2 (two) times a week. Place 0.5g nightly twice a week 02/25/24   Zuleta, Kaitlin G, NP  ondansetron  (ZOFRAN -ODT) 4 MG disintegrating tablet TAKE 1 TABLET BY MOUTH EVERY 8 HOURS AS NEEDED FOR NAUSEA OR VOMITING 01/05/23   [name], Jose E, MD    Current Outpatient Medications  Medication Sig Dispense Refill   acetaminophen  (TYLENOL ) 500 MG tablet Take 1,000 mg by mouth every 8 (eight) hours as needed.     cetirizine (ZYRTEC) 10 MG tablet Take 10 mg by mouth daily.     Cholecalciferol (VITAMIN D ) 50 MCG (2000 UT) CAPS Take 1 each by mouth daily.     clorazepate  (TRANXENE ) 7.5 MG tablet TAKE 1 TABLET (7.5 MG TOTAL) BY MOUTH AT BEDTIME AS NEEDED FOR ANXIETY. 30 tablet 1   gabapentin  (NEURONTIN ) 300 MG capsule Take 1 capsule (300 mg total) by mouth 2 (two) times daily.     methenamine  (HIPREX ) 1 g tablet Take 1 g by mouth 2 (two) times daily.     omeprazole  (PRILOSEC) 40 MG capsule Take 1 capsule (40 mg total) by mouth daily. Patient needs follow up appointment for future refills. Please call [phone] to  schedule an appointment. 90 capsule 3   Probiotic Product (PROBIOTIC PO) Take 1 tablet by mouth daily.     traMADol  (ULTRAM ) 50 MG tablet Take 50 mg by mouth every 6 (six) hours as needed.     valACYclovir  (VALTREX ) 500 MG tablet TAKE 1 TABLET (500 MG TOTAL) BY MOUTH DAILY. 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 90 tablet 4   estradiol  (ESTRACE ) 0.1 MG/GM vaginal cream Place 0.5 g vaginally 2 (two) times a week. Place 0.5g nightly twice a week 42.5 g 11   ondansetron  (ZOFRAN -ODT) 4 MG disintegrating tablet TAKE 1 TABLET BY MOUTH EVERY 8 HOURS AS NEEDED FOR NAUSEA OR VOMITING 30 tablet 0   Current Facility-Administered Medications  Medication Dose Route Frequency Provider Last Rate Last Admin   0.9 %  sodium chloride  infusion  500 mL Intravenous Continuous Rishab Stoudt, Gordy HERO, MD       denosumab  (PROLIA ) injection 60 mg  60 mg Subcutaneous Once Boswell, Elizabeth K, MD        Allergies as of 08/08/2024 - Review Complete 08/08/2024  Allergen Reaction Noted   Doxycycline  Rash 05/19/2019   Other Other (See Comments) 04/23/2020   Oxycodone  Nausea And Vomiting 05/05/2019   Sulfa  antibiotics Hives 04/23/2020   Codeine Nausea Only 08/31/2011   Propranolol hcl Other (See Comments)    Trimethoprim  sulfate [trimethoprim ] Rash 11/05/2019    Family History  Problem Relation Age of Onset   Osteoporosis Mother    Lung cancer Mother    Stroke Mother    Diabetes Father    Hypertension Father    Osteoporosis Father    Colon cancer Father 59   Breast cancer Sister        Age 75   Leukemia Maternal Grandfather    Crohn's disease Son    Ovarian cancer Maternal Aunt 33   Breast cancer Maternal Aunt        great aunt- Age unknown   Colon cancer Paternal Uncle 53   Breast cancer Cousin        maternal-Age 22   Colon cancer Cousin        maternal first cousin  Social History   Socioeconomic History   Marital status: Single    Spouse name: Not on file   Number of children: 2   Years of education: Not on file    Highest education level: Master's degree (e.g., MA, MS, MEng, MEd, MSW, MBA)  Occupational History   Occupation: Runner, broadcasting/film/video, works part time     Associate Professor: [employer]  Tobacco Use   Smoking status: Former    Current packs/day: 0.00    Average packs/day: 0.1 packs/day for 30.0 years (3.0 ttl pk-yrs)    Types: Cigarettes    Start date: 10/30/1965    Quit date: 10/31/1995    Years since quitting: 28.7   Smokeless tobacco: Never  Vaping Use   Vaping status: Never Used  Substance and Sexual Activity   Alcohol use: Yes    Comment: occ   Drug use: No   Sexual activity: Not Currently    Birth control/protection: Surgical    Comment: 1st intercourse 78 yo-Fewer than 5 partners, hysterectomt  Other Topics Concern   Not on file  Social History Narrative   Lives by herself   2 children, one has mental issues    Social Drivers of Corporate investment banker Strain: Low Risk  (07/04/2024)   Overall Financial Resource Strain (CARDIA)    Difficulty of Paying Living Expenses: Not very hard  Food Insecurity: No Food Insecurity (07/04/2024)   Hunger Vital Sign    Worried About Running Out of Food in the Last Year: Never true    Ran Out of Food in the Last Year: Never true  Transportation Needs: No Transportation Needs (07/04/2024)   PRAPARE - Administrator, Civil Service (Medical): No    Lack of Transportation (Non-Medical): No  Physical Activity: Inactive (07/04/2024)   Exercise Vital Sign    Days of Exercise per Week: 0 days    Minutes of Exercise per Session: 0 min  Stress: Stress Concern Present (07/04/2024)   [employer] of Occupational Health - Occupational Stress Questionnaire    Feeling of Stress: To some extent  Social Connections: Moderately Isolated (07/04/2024)   Social Connection and Isolation Panel    Frequency of Communication with Friends and Family: Once a week    Frequency of Social Gatherings with Friends and Family: Once a week    Attends Religious  Services: More than 4 times per year    Active Member of [employer] or Organizations: Yes    Attends Engineer, structural: More than 4 times per year    Marital Status: Divorced  Intimate Partner Violence: Not At Risk (07/04/2024)   Humiliation, Afraid, Rape, and Kick questionnaire    Fear of Current or Ex-Partner: No    Emotionally Abused: No    Physically Abused: No    Sexually Abused: No    Physical Exam: Vital signs in last 24 hours: @BP  129/81   Pulse 98   Temp 98.1 F (36.7 C)   Ht 5' 2.5 (1.588 m)   Wt 140 lb (63.5 kg)   SpO2 100%   BMI 25.20 kg/m  GEN: NAD EYE: Sclerae anicteric ENT: MMM CV: Non-tachycardic Pulm: CTA b/l GI: Soft, NT/ND NEURO:  Alert & Oriented x 3   Gordy Starch, MD [facility]  08/08/2024 11:47 AM

## 2024-08-08 NOTE — Patient Instructions (Addendum)
  Resume previous diet. Continue present medications. Repeat colonoscopy in 5 years for surveillance   YOU HAD AN ENDOSCOPIC PROCEDURE TODAY AT [facility]:   Refer to the procedure report that was given to you for any specific questions about what was found during the examination.  If the procedure report does not answer your questions, please call your gastroenterologist to clarify.  If you requested that your care partner not be given the details of your procedure findings, then the procedure report has been included in a sealed envelope for you to review at your convenience later.  YOU SHOULD EXPECT: Some feelings of bloating in the abdomen. Passage of more gas than usual.  Walking can help get rid of the air that was put into your GI tract during the procedure and reduce the bloating. If you had a lower endoscopy (such as a colonoscopy or flexible sigmoidoscopy) you may notice spotting of blood in your stool or on the toilet paper. If you underwent a bowel prep for your procedure, you may not have a normal bowel movement for a few days.  Please Note:  You might notice some irritation and congestion in your nose or some drainage.  This is from the oxygen used during your procedure.  There is no need for concern and it should clear up in a day or so.  SYMPTOMS TO REPORT IMMEDIATELY:  Following lower endoscopy (colonoscopy or flexible sigmoidoscopy):  Excessive amounts of blood in the stool  Significant tenderness or worsening of abdominal pains  Swelling of the abdomen that is new, acute  Fever of 100F or higher  For urgent or emergent issues, a gastroenterologist can be reached at any hour by calling [phone]. Do not use MyChart messaging for urgent concerns.    DIET:  We do recommend a small meal at first, but then you may proceed to your regular diet.  Drink plenty of fluids but you should avoid alcoholic beverages for 24 hours.  ACTIVITY:  You should plan to  take it easy for the rest of today and you should NOT DRIVE or use heavy machinery until tomorrow (because of the sedation medicines used during the test).    FOLLOW UP: Our staff will call the number listed on your records the next business day following your procedure.  We will call around 7:15- 8:00 am to check on you and address any questions or concerns that you may have regarding the information given to you following your procedure. If we do not reach you, we will leave a message.     If any biopsies were taken you will be contacted by phone or by letter within the next 1-3 weeks.  Please call us at [phone] if you have not heard about the biopsies in 3 weeks.    SIGNATURES/CONFIDENTIALITY: You and/or your care partner have signed paperwork which will be entered into your electronic medical record.  These signatures attest to the fact that that the information above on your After Visit Summary has been reviewed and is understood.  Full responsibility of the confidentiality of this discharge information lies with you and/or your care-partner.

## 2024-08-11 ENCOUNTER — Telehealth: Payer: Self-pay

## 2024-08-11 NOTE — Telephone Encounter (Signed)
  Follow up Call-     08/08/2024   10:51 AM  Call back number  Post procedure Call Back phone  # [phone]  Permission to leave phone message Yes     Patient questions:  Do you have a fever, pain , or abdominal swelling? No. Pain Score  0 *  Have you tolerated food without any problems? Yes.    Have you been able to return to your normal activities? Yes.    Do you have any questions about your discharge instructions: Diet   No. Medications  No. Follow up visit  No.  Do you have questions or concerns about your Care? No.  Actions: * If pain score is 4 or above: No action needed, pain <4.

## 2024-08-13 ENCOUNTER — Telehealth: Payer: Self-pay

## 2024-08-13 NOTE — Telephone Encounter (Signed)
 Was cleared by me and cardiology 10/2023 after a myocardial perfusion imaging showed good results. She tolerated extensive back surgery 12/2023. If she is not having chest pain, difficulty breathing, palpitations, lower extremity edema major problems with cough she is clear from my side.  Otherwise needs office visit

## 2024-08-13 NOTE — Telephone Encounter (Signed)
 Spoke w/ Pt- denies CP, SHOB, palpitations or lower extremity edema.

## 2024-08-13 NOTE — Telephone Encounter (Signed)
 Received surgery clearance from [facility]. Pt is scheduled for [facility]* Right S1-Iliac Instrumentation and fusion, removal of broken screw at S1, possible bone morphogenic protein, bone autograft, lifenet bone allograft with Dr. Willma Harari on 09/22/24. Last OV 06/20/2024, last EKG 11/20/2023. Please advise if appt is needed for clearance.

## 2024-08-13 NOTE — Telephone Encounter (Signed)
 Completed surgery clearance form faxed back to [facility] at [phone] with last OV note, EKG, labs and stress test. Form sent for scanning.

## 2024-08-14 DIAGNOSIS — M546 Pain in thoracic spine: Secondary | ICD-10-CM | POA: Diagnosis not present

## 2024-08-14 DIAGNOSIS — M5459 Other low back pain: Secondary | ICD-10-CM | POA: Diagnosis not present

## 2024-08-14 DIAGNOSIS — M79604 Pain in right leg: Secondary | ICD-10-CM | POA: Diagnosis not present

## 2024-08-14 DIAGNOSIS — M4325 Fusion of spine, thoracolumbar region: Secondary | ICD-10-CM | POA: Diagnosis not present

## 2024-08-21 ENCOUNTER — Telehealth: Payer: Self-pay | Admitting: Internal Medicine

## 2024-08-21 DIAGNOSIS — F419 Anxiety disorder, unspecified: Secondary | ICD-10-CM

## 2024-08-21 NOTE — Telephone Encounter (Signed)
 Requesting: clorazepate  7.5mg   Contract:11/20/23 UDS:06/20/24 Last Visit: 06/20/24 Next Visit:06/26/25 Last Refill: 04/18/24 #30 and 1RF   Please Advise

## 2024-08-21 NOTE — Telephone Encounter (Signed)
 PDMP okay, Rx sent

## 2024-08-28 DIAGNOSIS — M546 Pain in thoracic spine: Secondary | ICD-10-CM | POA: Diagnosis not present

## 2024-08-28 DIAGNOSIS — M4325 Fusion of spine, thoracolumbar region: Secondary | ICD-10-CM | POA: Diagnosis not present

## 2024-08-28 DIAGNOSIS — M5459 Other low back pain: Secondary | ICD-10-CM | POA: Diagnosis not present

## 2024-09-03 DIAGNOSIS — M1712 Unilateral primary osteoarthritis, left knee: Secondary | ICD-10-CM | POA: Diagnosis not present

## 2024-09-03 DIAGNOSIS — M170 Bilateral primary osteoarthritis of knee: Secondary | ICD-10-CM | POA: Diagnosis not present

## 2024-09-03 DIAGNOSIS — M25561 Pain in right knee: Secondary | ICD-10-CM | POA: Diagnosis not present

## 2024-09-03 DIAGNOSIS — M25562 Pain in left knee: Secondary | ICD-10-CM | POA: Diagnosis not present

## 2024-09-03 DIAGNOSIS — M1711 Unilateral primary osteoarthritis, right knee: Secondary | ICD-10-CM | POA: Diagnosis not present

## 2024-09-08 DIAGNOSIS — T84226A Displacement of internal fixation device of vertebrae, initial encounter: Secondary | ICD-10-CM | POA: Diagnosis not present

## 2024-09-08 DIAGNOSIS — Z981 Arthrodesis status: Secondary | ICD-10-CM | POA: Diagnosis not present

## 2024-09-08 DIAGNOSIS — S32059A Unspecified fracture of fifth lumbar vertebra, initial encounter for closed fracture: Secondary | ICD-10-CM | POA: Diagnosis not present

## 2024-09-22 DIAGNOSIS — M4325 Fusion of spine, thoracolumbar region: Secondary | ICD-10-CM | POA: Diagnosis not present

## 2024-09-22 DIAGNOSIS — T84226A Displacement of internal fixation device of vertebrae, initial encounter: Secondary | ICD-10-CM | POA: Diagnosis not present

## 2024-09-23 NOTE — Progress Notes (Signed)
 Virtual Nurse Discharge Note  DC instructions/AVS reviewed with: Patient and Pt's daughter Patient is alert, interactive and appropriate; States and appears to be comfortable.  No complaints.  Patient still inside the 24 hour anesthesia window and unable to sign AVS: Yes Patient received a paper copy of AVS:Yes   Pt has active Mychart account: Yes  Questions answered; patient/family actively and appropriately engaged in discussion; able to verbalize understanding of instructions and rationale on AVS.   Care nurse notified of completion of discharge paperwork, instructions and education: Yes  VRN requested transport for patient: Yes   Interpreter needed for session?No  Appointment scheduled for follow up? no - pt instructed re contacting provider for FU per AVS  Specific/Additional Instructions or comment: General rounding, no needs identified.   Pt confirmed accuracy of their name on AVS and VRN confirmed number of pages listed on AVS matches the number of pages noted on VRN screen's AVS for discharge teaching.

## 2024-09-23 NOTE — Discharge Summary (Signed)
 ORTHOPAEDIC SPINE SURGERY DISCHARGE SUMMARY  Name:  Cynthia Thornton MRN: [id number] Attending Provider: Walterine Willma Cancer, MD  DOB:  12/31/1945  Age: 78 y.o. 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.226A] Fusion of spine of thoracolumbar region [M43.25] Low back pain [M54.50]   Procedure(s): PR REINSERT SPINAL FIXATION [22849] (Right S1-Iliac revision instrumentation and fusion, removal of broken screw at S1, possible bone morphogenic protein, bone autograft, lifenet bone allograft) PR ARTHRODESIS POSTERIOR/PSTLAT TQ 1NTRSPC LUMBAR [22612] (ARTHRODESIS, POSTERIOR OR POSTEROLATERAL TECHNIQUE, SINGLE interspace; LUMBAR (WITH LATERAL TRANSVERSE TECHNIQUE, WHEN PERFORMED)) PR ARTHRODESIS PST/PSTLAT TQ 1NTRSPC EA ADDL NTRSPC [22614] (ARTHRODESIS, POSTERIOR OR POSTEROLATERAL TECHNIQUE, SINGLE interspace; EACH ADDITIONAL (LIST IN ADDITION TO PRIMARY PROCEDURE)) PR ALLOGRFT MORSEL SPINE SURG ONLY [20930] (ALLOGRAFT, MORSELIZED, OR PLACEMENT OF OSTEOPROMOTIVE MATERIAL, FOR SPINE SURGERY ONLY (LIST IN ADDITION TO PRIMARY PROCEDURE)) PR AUTOGRFT LOCAL SPINE SURG ONLY [20936] (AUTOGRAFT FOR SPINE SURGERY ONLY (INCL HARVESTING GRAFT); LOCAL (EG, RIBS, SPINOUS PROCESS, OR LAMINAR FRAGMENT) OBTAINED FROM SAME INCISION (LIST IN ADDITION TO PRIMARY PROCEDURE))  Complications: None  Consults: Hospitalist, Infectious Disease, Cardiology, Pain Management, and WOCN    Comorbid Conditions:   Co-Morbidities: Past Medical History:  Diagnosis Date  . Anemia 2021   sepsis and admitted to hospital in [facility]  . Arthritis   . GERD (gastroesophageal reflux disease)   . History of cancer 2019   left breast lumpectomy  -POSITIVE-Surgery and radiation  . Hyperthyroidism    2010  . Motion sickness   . PONV (postoperative nausea and vomiting)   . Poor intravenous access   . Scoliosis   . UTI (urinary tract infection)     Patient Active Problem List  Diagnosis  . Postural kyphosis of thoracic region  . Breast cancer (CMS/HHS-HCC)  . Hypothyroidism, adult  . Osteopenia  . Bronchiectasis (CMS/HHS-HCC)  . Obstipation  . Reflux gastritis  . Heartburn  . 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 Demerol [Meperidine] Vomiting    States N/V with other opiods but can't remember witch ones.  . Propranolol Palpitations  . Sulfa  (Sulfonamide Antibiotics) Hives    Hospital Course and Treatment:   Cynthia Thornton is Hospital Day: 2 and 1 Day Post-Op status post Procedure(s): Right S1-Iliac revision instrumentation and fusion, removal of broken screw at S1, possible bone morphogenic protein, bone autograft, lifenet bone allograft ARTHRODESIS, POSTERIOR OR POSTEROLATERAL TECHNIQUE, SINGLE interspace; LUMBAR (WITH LATERAL TRANSVERSE TECHNIQUE, WHEN PERFORMED) ARTHRODESIS, POSTERIOR OR POSTEROLATERAL TECHNIQUE, SINGLE interspace; EACH ADDITIONAL (LIST IN ADDITION TO PRIMARY PROCEDURE) ALLOGRAFT, MORSELIZED, OR PLACEMENT OF OSTEOPROMOTIVE MATERIAL, FOR SPINE SURGERY ONLY (LIST IN ADDITION TO PRIMARY PROCEDURE) AUTOGRAFT FOR SPINE SURGERY ONLY (INCL HARVESTING GRAFT); LOCAL (EG, RIBS, SPINOUS PROCESS, OR LAMINAR FRAGMENT) OBTAINED FROM SAME INCISION (LIST IN ADDITION TO PRIMARY PROCEDURE) by Walterine Willma Cancer, MD.  On the day of surgery, NPO status and consent were verified. There were no apparent intraoperative complications.  The patient tolerated the procedure well and was transferred to the PACU in stable condition. After an uneventful postoperative recovery, the patient was transferred to the 3rd floor of [facility] unit.    While  on the floor the patient's hgb/hct levels and  vital signs were monitored. These remained stable throughout the hospitalization as they were stable at the time of discharge. The patient was seen by physical therapy and occupational therapy for evaluation and treatment of the back   The patient's pain was well controlled and continued to make progress with therapy.  Physical therapy worked with the patient daily, and once medically stable, recommended the patient would benefit from discharge to home with home exercise program.   On POD #1 the patient was ambulating, voiding, and tolerating an oral diet without difficulty. The surgical incision was clean, dry and intact without evidence of erythema, drainage, fevers or any other signs of infection. The patient had good sensation in the lower extremities.  Therefore, the patient was found to be appropriate for discharge and was discharged in stable condition to Home with detailed instructions for the patient and family.   Discharge Condition:   Overall good.  Incision is clean, dry, and intact.  Discharge Medications:     Medication List     START taking these medications    methylPREDNISolone  4 mg tablet Commonly known as: MEDROL  DOSEPACK Follow package directions.       CHANGE how you take these medications    clorazepate  7.5 MG tablet Commonly known as: TRANXENE  Take 1 tablet (7.5 mg total) by mouth at bedtime as needed for Anxiety What changed: how much to take   lactobacillus rhamnosus (GG) 10 billion cell capsule Commonly known as: CULTURELLE Take 1 capsule by mouth once daily What changed: when to take this       CONTINUE taking these medications    acetaminophen  500 MG tablet Commonly known as: TYLENOL    calcium  carbonate 300 mg (750 mg) chewable tablet Commonly known as: TUMS E-X Take 2 tablets (1,500 mg of salt total) by mouth 3 (three) times daily as needed for Heartburn   cetirizine 10 MG tablet Commonly  known as: ZyrTEC   DULoxetine 30 MG DR capsule Commonly known as: CYMBALTA Take one tablet per day for 5 days, then one tablet twice a day.   estradioL  0.01 % (0.1 mg/gram) vaginal cream Commonly known as: ESTRACE    famotidine  20 MG tablet Commonly known as: PEPCID  Take 1 tablet (20 mg total) by mouth 2 (two) times daily   linaCLOtide 145 mcg capsule Commonly known as: LINZESS Take 1 capsule (145 mcg total) by mouth every morning before breakfast   methenamine  hippurate 1 gram tablet Commonly known as: HIPREX  Take 1 tablet (1 g total) by mouth 2 (two) times daily as needed   nitrofurantoin  (macrocrystal-monohydrate) 100 MG capsule Commonly known as: MACROBID    omeprazole  40 MG DR capsule Commonly known as: PriLOSEC Take 1 capsule (40 mg total) by mouth 2 (two) times daily   ondansetron  4 MG disintegrating tablet Commonly known as: ZOFRAN -ODT   polyethylene glycol packet Commonly known as: MIRALAX Take 1 packet (17 g total) by mouth 2 (two) times daily Mix in 4-8ounces of fluid prior to taking.   sennosides-docusate 8.6-50 mg tablet Commonly known as: SENOKOT-S Take 2 tablets by mouth at bedtime   traMADoL  50 mg tablet Commonly known as: ULTRAM    valACYclovir  500 MG tablet Commonly known as: VALTREX        STOP taking these medications    gabapentin  300 MG capsule Commonly known as: NEURONTIN          Where to Get Your Medications     These medications were sent to [id number] - [place], [place] - [address]  [address], [place] [address]    Phone: [phone]  methylPREDNISolone  4 mg tablet     Discharge Plan/Instructions:  Please refer to discharge instruction sheet as appropriate  Discharge Location: Home  Diet: Regular.  Activity: Light, No bending or twisting. Use walkers, or wheelchairs for safe mobilizations and ambulation.  Continue physical and occupational therapy as clinically indicated.   Incisional Care: :  The thin  clear plastic tape (tegoderm) and Asfour gauze dressing  should be removed once patient is home.  The Steri-Strips underneath should be left on, and removed at 2 weeks after surgery.  If these fall off prior to 2 weeks, they do not need to be replaced.  Underneath the Steri-Strips is the plastic surgery skin sealant called Dermabond.  This is to help keep the incision waterproof and will come off on its own over the first 2 weeks after surgery.  If there is some glue left, you can lightly peel this away at 2 weeks post op, but if it appears to be pulling on the incision, let is fall off on its own.  Do not put any lotions, creams, oils, etc. on the incision until you are seen at the 6 week post-op visit.  Keep incision out of bright sun, and keep sunscreen over incision to prevent hyperpigmentation of scar for best long-term cosmetic results.  It is OK to shower and get incision wet as soon as you go home, but don't submerge incision in hot tub, pool or ocean/lake until at least 2 weeks postop.  Contact Office For:  Contact [facility] at [phone], or Dr. Walterine directly [phone] if you any of the following signs, which are quite rare.   a. Wound bleeding or drainage, redness or increasing swelling or purulence (leaking of any whitish fluid or collection of "pus" looking material outside of the incision.  (Wound bleeding can lead to infection, which we can stop sometimes with small stitch) b. Fever greater than 101 degrees, chills,  severe nausea or vomiting.  c. Neurologic changes with increased weakness, clumsiness, numbness or radiating extremity pain, or loss of bowel or bladder function.  d. Increasing severe pain or change in deformity / posture or prominence.   Follow-up:Patient was likely given an appointment already to see us  back in 6 weeks for a postop visit.  For some of of our out-of -state guests, depending on how you are doing closer to your 6 week post op appointment, we may help avoid the  long trip for you by arranging to get some X-Rays done locally and sent to us  for review.  We will keep your appointment on the schedule and then touch base with you closer to the 6 week post op mark to see if this is an option for you.   LLOYD A HEY, MD

## 2024-09-23 NOTE — Progress Notes (Signed)
 Physical Therapy  Initial Evaluation for Spinal Surgery  Patient Name:  Cynthia Thornton Date of Evaluation: 09/23/24 Time of Evaluation:  1010 Duration of Session:  20 Minutes Room/Bed: 3709/3709-A   Plan: Discharge Recommendation: Home Is the patient safe to discharge to the recommended disposition? Yes DME recommendations: To use owned DME: RW  However, if patient discharges home they will need:  At this time, patient will need: supervision for all mobility.  Mode of transportation recommended: car  Treatment/Interventions: Bed mobility, Transfers, Gait training, Mobility training, Stair training PT Frequency: Evaluation only Plan (Progress Note): Discontinue physical therapy Assessment: Patient ambulating in hallways with RW and supervision. Recommend patient ambulate with nursing staff 3-4x a day and sit in chair for all meals. Patient cleared for safe DC home from a mobility perspective. No further skilled inpatient physical therapy needs at this time. Will sign off.  Reason for Admission Cynthia Thornton is a 78 y.o. female admitted on 09/22/2024 for Right S1-Iliac revision instrumentation and fusion, removal of broken screw at S1, possible bone morphogenic protein, bone autograft, lifenet bone allograft with Walterine Willma Cancer, MD.   She has a past medical history of Anemia (2021), Arthritis, GERD (gastroesophageal reflux disease), History of cancer (2019), Hyperthyroidism, Motion sickness, PONV (postoperative nausea and vomiting), Poor intravenous access, Scoliosis, and UTI (urinary tract infection).  She has a past surgical history that includes Tonsillectomy (1965); Breast lumpectomy (Left, 2019); Hysterectomy (1989); Oophorectomy (Bilateral, 2021); transforaminal lumbar interbody fusion minimally invasive (N/A, 12/03/2023); insertion interbody biomechanical device w/ instrumentation (N/A, 12/03/2023); revision instrumentation spine (N/A, 12/03/2023); osteotomy lumbar spine  posterior/posterolateral (N/A, 12/03/2023); osteotomy spine cervical (N/A, 12/03/2023); insertion morselized bone allograft for spine surgery (N/A, 12/03/2023); autograft obtained same incision for spine surgery (N/A, 12/03/2023); arthrodesis posterior spine (N/A, 12/03/2023); instrumentation anterior spine 2 to 3 vertebral segments (N/A, 12/03/2023); egd (N/A, 12/21/2023); and Colonoscopy.   Precautions:  fall risk, no thoracic/lumbar brace ordered, log roll, and back precautions (no BLTs)  Social and Prior Level of Function Patient lives: alone - daughter staying with patient at DC to assist  Patient lives in: a single story home - with a few steps to enter  Prior to admission:  Patient independent with all mobility with no device.   Equipment available at home: bedrail and rolling walker Falls: Have you fallen in the past 3 months? No   Subjective Patient:  is willing to work with therapy - consents to PT evaluation  Pain score prior to TX:  reports tolerable, does not quantify at surgical site Pain score after TX:  same as above  The patient just received pain medications.  Objective Patient presents:  in bed - daughter present  Cardiopulmonary:  EOB BP 121/68, patient asymptomatic with mobility  State of alertness/cognition:  alert Follows commands:  consistently Patient's safety awareness:  is aware of safety precautions Neuromuscular:  intact Balance:  supervision for dynamic standing balance with RW  Musculoskeletal:   Bilateral UE strength:  functional (See OT eval, if appropriate, for further details.)  Right LE strength:  functional   Left LE strength:  functional   Treatment: Brace:  not applicable Patient provided with home instruction handouts for spinal surgery as appropriate. Bed Mobility: Modified independent for supine<>sit with use of bedrail       Transfers: Supervision for sit<>stand with RW Verbally reviewed and demonstrated safe car transfer technique with  patient   Ambulation:  ~100 feet with supervision with rolling walker. (Instructed in safe use of  assistive device if appropriate.) Gait description: no overt loss of balance Steps: patient demonstrated ascending/descending steps with two rails with supervision   Functional application of spine precautions:  able to verbalize and demonstrate spine precautions  Patient left:  sitting in recliner with feet up, chair alarm on, call light and all needs in reach.  Patient performance during therapy was communicated to:  PA, OT, RN, case manager  Patient Education Today: PT plan and DC, functional mobility, safety, transfers, gait, use of assistive device, spinal precautions, logrolling, positioning in bed or chair, car transfer with ability to maintain any appropriate precautions, and activity parameters; patient verbalized understanding and demonstrated understanding of education done today.   PT Goals     * PT Goals (Active)     There are no active problems.             * PT Goals (Resolved)     There are no resolved problems.              ASHLEY A HERBERT, PT     *Some images could not be shown.

## 2024-09-24 ENCOUNTER — Telehealth: Payer: Self-pay

## 2024-09-24 NOTE — Transitions of Care (Post Inpatient/ED Visit) (Signed)
   09/24/2024  Name: Cynthia Thornton MRN: [id number] DOB: [birth year]  Today's TOC FU Call Status: Today's TOC FU Call Status:: Unsuccessful Call (1st Attempt) Unsuccessful Call (1st Attempt) Date: 09/24/24  Attempted to reach the patient regarding the most recent Inpatient/ED visit.  Follow Up Plan: Additional outreach attempts will be made to reach the patient to complete the Transitions of Care (Post Inpatient/ED visit) call.   Medford Balboa, BSN, RN [place]  VBCI - [employer] [facility] RN Care Manager [phone]

## 2024-09-27 ENCOUNTER — Other Ambulatory Visit: Payer: Self-pay | Admitting: Obstetrics and Gynecology

## 2024-09-29 ENCOUNTER — Telehealth: Payer: Self-pay

## 2024-09-29 NOTE — Telephone Encounter (Signed)
 Med refill request: valtrex   Last AEX: 05/16/24  Next AEX: n/a Last MMG (if hormonal med) Refill authorized: last rx 09/19/23 #90 with 4 refills. Please advise

## 2024-09-29 NOTE — Transitions of Care (Post Inpatient/ED Visit) (Signed)
 09/29/2024  Name: Cynthia Thornton MRN: [id number] DOB: [birth year]  Today's TOC FU Call Status: Today's TOC FU Call Status:: Successful TOC FU Call Completed TOC FU Call Complete Date: 09/29/24  Patient's Name and Date of Birth confirmed. Name, DOB  Transition Care Management Follow-up Telephone Call Date of Discharge: 09/23/24 Discharge Facility: Other ([facility]) Name of Other ([facility]) Discharge Facility: [facility] Type of Discharge: Inpatient Admission Primary Inpatient Discharge Diagnosis:: Right S1-Iliac revision and fusion How have you been since you were released from the hospital?: Same Any questions or concerns?: Yes Patient Questions/Concerns:: Wants lab work Patient Questions/Concerns Addressed: Other: (Wants lab work)  Items Reviewed: Did you receive and understand the discharge instructions provided?: Yes Medications obtained,verified, and reconciled?: Yes (Medications Reviewed) Any new allergies since your discharge?: No Dietary orders reviewed?: Yes Type of Diet Ordered:: Low sodium heart healhty Do you have support at home?: Yes People in Home [RPT]: alone Name of Support/Comfort Primary Source: [employer], son and D[employer]  Medications Reviewed Today: Medications Reviewed Today     Reviewed by Moises Reusing, RN (Case Manager) on 09/29/24 at 1507  Med List Status: <None>   Medication Order Taking? Sig Documenting Provider Last Dose Status Informant  acetaminophen  (TYLENOL ) 500 MG tablet [id number]  Take 1,000 mg by mouth every 8 (eight) hours as needed. [provider]  Active   cetirizine (ZYRTEC) 10 MG tablet [id number] Yes Take 10 mg by mouth daily. [provider]  Active   Cholecalciferol (VITAMIN D ) 50 MCG (2000 UT) CAPS [id number]  Take 1 each by mouth daily. [provider]  Active   clorazepate  (TRANXENE ) 7.5 MG tablet [id number]  Take 1 tablet (7.5 mg total) by mouth at bedtime as needed for sleep. [name],  Jose E, MD  Active   denosumab  (PROLIA ) injection 60 mg [id number]   Glennon Almarie POUR, MD  Active   DULoxetine (CYMBALTA) 30 MG capsule [id number] Yes Take 30 mg by mouth daily. Take for five days then one tablet twice a day [provider]  Active   estradiol  (ESTRACE ) 0.1 MG/GM vaginal cream [id number]  Place 0.5 g vaginally 2 (two) times a week. Place 0.5g nightly twice a week Zuleta, Kaitlin G, NP  Active   famotidine  (PEPCID ) 20 MG tablet [id number] Yes Take 20 mg by mouth 2 (two) times daily. [provider]  Active   gabapentin  (NEURONTIN ) 300 MG capsule [id number]  Take 1 capsule (300 mg total) by mouth 2 (two) times daily.  Patient not taking: Reported on 09/29/2024   Amon Aloysius BRAVO, MD  Active   LACTOBACILLUS RHAMNOSUS, GG, PO [id number] Yes Take 10 Billion Cells by mouth daily. [provider]  Active   methenamine  (HIPREX ) 1 g tablet [id number]  Take 1 g by mouth 2 (two) times daily. [provider]  Active   methylPREDNISolone  (MEDROL ) 4 MG tablet [id number] Yes Take 4 mg by mouth daily. [provider]  Active   omeprazole  (PRILOSEC) 40 MG capsule [id number]  Take 1 capsule (40 mg total) by mouth daily. Patient needs follow up appointment for future refills. Please call [phone] to schedule an appointment. [place], PA-C  Active   ondansetron  (ZOFRAN -ODT) 4 MG disintegrating tablet [id number]  TAKE 1 TABLET BY MOUTH EVERY 8 HOURS AS NEEDED FOR NAUSEA OR VOMITING [name], Jose E, MD  Active   Probiotic Product (PROBIOTIC PO) [id number]  Take 1 tablet by mouth daily. [provider]  Active   traMADol  (ULTRAM ) 50  MG tablet [id number]  Take 50 mg by mouth every 6 (six) hours as needed. [provider]  Active   valACYclovir  (VALTREX ) 500 MG tablet [id number]  TAKE 1 TABLET (500 MG TOTAL) BY MOUTH DAILY. 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 Glennon Almarie POUR, MD  Active             Home Care and Equipment/Supplies: Were Home Health  Services Ordered?: NA Any new equipment or medical supplies ordered?: NA  Functional Questionnaire: Do you need assistance with bathing/showering or dressing?: No Do you need assistance with meal preparation?: No Do you need assistance with eating?: No Do you have difficulty maintaining continence: No Do you need assistance with getting out of bed/getting out of a chair/moving?: No Do you have difficulty managing or taking your medications?: No  Follow up appointments reviewed: PCP Follow-up appointment confirmed?: Yes Date of PCP follow-up appointment?: 10/13/24 Follow-up Provider: T. Beck for Dr. Amon Driscilla [facility] Follow-up appointment confirmed?: Yes Date of Specialist follow-up appointment?: 11/03/24 Follow-Up Specialty Provider:: [facility] Do you need transportation to your follow-up appointment?: No Do you understand care options if your condition(s) worsen?: Yes-patient verbalized understanding  SDOH Interventions Today    Flowsheet Row Most Recent Value  SDOH Interventions   Food Insecurity Interventions Intervention Not Indicated  Housing Interventions Intervention Not Indicated  Transportation Interventions Intervention Not Indicated  Utilities Interventions Intervention Not Indicated    Medford Balboa, BSN, RN   VBCI - Population Health RN Care Manager [phone]

## 2024-10-13 ENCOUNTER — Inpatient Hospital Stay: Admitting: Family Medicine

## 2024-10-13 LAB — HM MAMMOGRAPHY

## 2024-10-15 ENCOUNTER — Ambulatory Visit: Admitting: Family Medicine

## 2024-10-15 VITALS — BP 125/64 | HR 66 | Temp 97.5°F | Ht 62.5 in | Wt 148.0 lb

## 2024-10-15 DIAGNOSIS — Z9889 Other specified postprocedural states: Secondary | ICD-10-CM

## 2024-10-15 DIAGNOSIS — Z09 Encounter for follow-up examination after completed treatment for conditions other than malignant neoplasm: Secondary | ICD-10-CM

## 2024-10-15 NOTE — Progress Notes (Signed)
 Acute Office Visit  Subjective:  Patient ID: Cynthia Thornton, female    DOB: [birth year]  Age: 78 y.o. MRN: [id number]  CC:  Chief Complaint  Patient presents with   Follow-up    Post-op follow up      HPI Cynthia Thornton is here for hospital follow-up. Admitted 09/22/2024 for Right S1-Iliac revision instrumentation and fusion and removal of broken screw at [facility]). Following her procedure she underwent physical therapy evaluation and had a rather uneventful hospital stay. Reports she has been doing well overall, only needing a half tablet of tramadol  in the evenings for pain management. She is walking well without assistive devices. Reports she has follow-up scheduled with surgeon tomorrow. She would like for me to update her labs, give slightly abnormal results post surgery.     Past Medical History:  Diagnosis Date   Annual physical exam 07/27/2015   Anxiety 03/29/2019   Arthritis    Bilateral cataracts    Breast cancer, left [facility]) oncologist-- dr odean   dx 09-26-2017--- IDC, Stage IA, Grade 2, ER/PR +;  HER2 negative;  s/p  breast lumpectomy w/ node dissection 11-02-2017;  completed radiation 01-09-2018 (left breast genetic screening panel 2017 with variant of unknown significance AXINA)   Bronchiectasis (HCC)    pulmologist-- dr rosiland-- w/ chronic lingular scarring  (last exceratbation bronchiectasis 07/ 2020)   Change in bowel habits 09/10/2014   COLONIC POLYPS, BENIGN 01/16/2008   Qualifier: Diagnosis of   By: Lewellyn CMA (AAMA), Amanda         DEGENERATIVE JOINT DISEASE 01/16/2008   Qualifier: Diagnosis of   By: Lewellyn CMA (AAMA), Amanda      Replacing diagnoses that were inactivated after the 01/29/23 regulatory import     Dyslipidemia    followed by cardiology/ vascular-- dr g. adams ([facility] in [place])  family history strokes   Eczema    ESOPHAGEAL STRICTURE 04/05/2007   Annotation: per EGD  Qualifier: Diagnosis of   By: Amon MD, Aloysius BRAVO.        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  The Comprehensive Cancer Panel offered by [facility] includes sequencing and/or deletion duplication testing of the following 32 genes: APC, ATM, AXIN2, BARD1, BMPR1A, BRCA1, BRCA2, BRIP1, CDH1, CDK4, CDKN2A, CHEK2, EPCAM, FANCC, MLH1, MSH2, MSH6, MUTYH, NBN, PALB2, PMS2, POLD1, POLE, PTEN, RAD51C, RAD51D, SCG5/GREM1, SMAD4, STK1   GERD 07/30/2006   Qualifier: Diagnosis of   By: Amon MD, Aloysius Cynthia Thornton, MULTINODULAR 11/27/2008   Qualifier: Diagnosis of   By: Kassie MD, Sean A        HIATAL HERNIA 08/15/2006   Qualifier: Diagnosis of   By: Earlean CMA (AAMA), Amanda      Replacing diagnoses that were inactivated after the 01/29/23 regulatory import     History of esophageal stricture    s/p dilatation 2008   History of external beam radiation therapy    left breast 12-19-2017  to 01-09-2018   History of hyperthyroidism    endocrinologist-- (lov 04-11-2019 epic) dr kassie, dx 2009 due to multinodular goiter ,  had taken medication for few months 2010 stopped due to norma  TFT;     Hypercholesteremia 10/03/2010   Overview:   Chronic [Hypercholesterolemia]     Insomnia    Irritation of left eye 02/07/2022   Malignant neoplasm of lower-inner quadrant of left breast in female, estrogen receptor positive (HCC) 10/02/2017   Osteopenia 02/2018   T score -2.4 distal third of radius.  FRAX 11% / 1.8% stable at other points of interest to include spine, right and left hip   Osteoporosis    2025 severe -3.3 h/o fracture with fall.   Pityriasis rosea    PONV (postoperative nausea and vomiting)    severe   Primary osteoarthritis of left knee    Severe Patellofemoral arthritis   Rhomboid pain    Right and right trapezius  with concomitant cervical spondylosis   Smoking history 01/15/2015   Thyrotoxicosis 10/22/2008   Qualifier: Diagnosis of   By: Tish MD, William         Tinnitus 06/27/2019   UNSPECIFIED ANEMIA 11/02/2008   Qualifier: Diagnosis of   By: Tish MD, Elsie         Wears contact lenses     Past Surgical History:  Procedure Laterality Date   BREAST LUMPECTOMY WITH RADIOACTIVE SEED AND SENTINEL LYMPH NODE BIOPSY Left 11/02/2017   Procedure: LEFT BREAST LUMPECTOMY WITH RADIOACTIVE SEED AND LEFT AXILLARY DEEP SENTINEL LYMPH NODE BIOPSY, INJECT BLUE DYE LEFT BREAST;  Surgeon: Gail Favorite, MD;  Location: [facility];  Service: General;  Laterality: Left;   BREAST SURGERY  2000   Breast cyst removed, left   LAPAROSCOPIC BILATERAL SALPINGO OOPHERECTOMY Bilateral 10/13/2019   Procedure: LAPAROSCOPIC BILATERAL SALPINGO OOPHORECTOMY LYSIS OF ADHESIONS PERITONEAL WASHINGS ;  Surgeon: Lavoie, Marie-Lyne, MD;  Location: [facility];  Service: Gynecology;  Laterality: Bilateral;  request 7:30am OR time in [place] [facility] block requests one hour   SPINAL FUSION  12/03/2023   Dr Walterine Persons   TONSILLECTOMY  age 85   TUBAL LIGATION Bilateral yrs ago   VAGINAL HYSTERECTOMY  1991   Right ovarian cystectomy   VIDEO BRONCHOSCOPY Bilateral 03/16/2015   Procedure: VIDEO BRONCHOSCOPY WITHOUT FLUORO;  Surgeon: Dorethia Cave, MD;  Location: [facility];  Service: Endoscopy;  Laterality: Bilateral;    Family History  Problem Relation Age of Onset   Osteoporosis Mother    Lung cancer Mother    Stroke Mother    Diabetes Father    Hypertension Father    Osteoporosis Father    Colon cancer Father 57   Breast cancer Sister        Age 30   Leukemia Maternal Grandfather    Crohn's disease Son    Ovarian cancer Maternal Aunt 26   Breast cancer Maternal Aunt        great aunt- Age unknown   Colon cancer Paternal Uncle 72   Breast cancer Cousin        maternal-Age 45    Colon cancer Cousin        maternal first cousin    Social History   Socioeconomic History   Marital status: Single    Spouse name: Not on file   Number of children: 2   Years of education: Not on file   Highest education level: Master's degree (e.g., MA, MS, MEng, MEd, MSW, MBA)  Occupational History   Occupation: runner, broadcasting/film/video, works part time     Associate Professor: Cynthia Thornton  Tobacco Use   Smoking status: Former    Current packs/day: 0.00  Average packs/day: 0.1 packs/day for 30.0 years (3.0 ttl pk-yrs)    Types: Cigarettes    Start date: 10/30/1965    Quit date: 10/31/1995    Years since quitting: 28.9   Smokeless tobacco: Never  Vaping Use   Vaping status: Never Used  Substance and Sexual Activity   Alcohol use: Yes    Comment: occ   Drug use: No   Sexual activity: Not Currently    Birth control/protection: Surgical    Comment: 1st intercourse 78 yo-Fewer than 5 partners, hysterectomt  Other Topics Concern   Not on file  Social History Narrative   Lives by herself   2 children, one has mental issues    Social Drivers of Health   Tobacco Use: Medium Risk (10/15/2024)   Patient History    Smoking Tobacco Use: Former    Smokeless Tobacco Use: Never    Passive Exposure: Not on Actuary Strain: Low Risk (10/15/2024)   Overall Financial Resource Strain (CARDIA)    Difficulty of Paying Living Expenses: Not hard at all  Food Insecurity: No Food Insecurity (10/15/2024)   Epic    Worried About Radiation Protection Practitioner of Food in the Last Year: Never true    Ran Out of Food in the Last Year: Never true  Transportation Needs: No Transportation Needs (10/15/2024)   Epic    Lack of Transportation (Medical): No    Lack of Transportation (Non-Medical): No  Physical Activity: Inactive (07/04/2024)   Exercise Vital Sign    Days of Exercise per Week: 0 days    Minutes of Exercise per Session: 0 min  Stress: Stress Concern Present (07/04/2024)   [employer] of  Occupational Health - Occupational Stress Questionnaire    Feeling of Stress: To some extent  Social Connections: Moderately Integrated (10/15/2024)   Social Connection and Isolation Panel    Frequency of Communication with Friends and Family: Twice a week    Frequency of Social Gatherings with Friends and Family: Once a week    Attends Religious Services: More than 4 times per year    Active Member of [employer] or Organizations: Yes    Attends Banker Meetings: Not on file    Marital Status: Divorced  Intimate Partner Violence: Not At Risk (09/29/2024)   Epic    Fear of Current or Ex-Partner: No    Emotionally Abused: No    Physically Abused: No    Sexually Abused: No  Depression (PHQ2-9): Low Risk (07/04/2024)   Depression (PHQ2-9)    PHQ-2 Score: 3  Alcohol Screen: Low Risk (10/15/2024)   Alcohol Screen    Last Alcohol Screening Score (AUDIT): 2  Housing: Low Risk (10/15/2024)   Epic    Unable to Pay for Housing in the Last Year: No    Number of Times Moved in the Last Year: 0    Homeless in the Last Year: No  Utilities: Not At Risk (09/29/2024)   Epic    Threatened with loss of utilities: No  Health Literacy: Adequate Health Literacy (07/04/2024)   B1300 Health Literacy    Frequency of need for help with medical instructions: Never    ROS All ROS negative except what is listed in the HPI.   Objective:   Today's Vitals: BP 125/64 (BP Location: Right Arm, Patient Position: Sitting)   Pulse 66   Temp (!) 97.5 F (36.4 C) (Oral)   Ht 5' 2.5 (1.588 m)   Wt 148 lb (67.1 kg)  SpO2 100%   BMI 26.64 kg/m   Physical Exam Vitals reviewed.  Constitutional:      Appearance: Normal appearance.  Cardiovascular:     Rate and Rhythm: Normal rate and regular rhythm.     Heart sounds: Normal heart sounds.  Pulmonary:     Effort: Pulmonary effort is normal.     Breath sounds: Normal breath sounds.  Skin:    General: Skin is warm and dry.  Neurological:     Mental  Status: She is alert and oriented to person, place, and time.  Psychiatric:        Mood and Affect: Mood normal.        Behavior: Behavior normal.        Thought Content: Thought content normal.        Judgment: Judgment normal.         Assessment & Plan:   Problem List Items Addressed This Visit   None Visit Diagnoses       Hospital discharge follow-up    -  Primary  Status post spinal surgery   Doing really well since surgery. No acute concerns. Not yet started PT, will discuss with surgeon if/when appropriate - she sees them tomorrow. Safety discussed.  - Repeated blood work to assess stabilization - Continue tramadol  at night for pain management. - Follow up with surgeon as scheduled for tomorrow.     Relevant Orders   CBC with Differential/Platelet   Basic metabolic panel with GFR                       Follow-up: Return if symptoms worsen or fail to improve.   Cynthia FURY Almarie, Cynthia Thornton, Cynthia Thornton  I,Cynthia Thornton,acting as a neurosurgeon for Cynthia KATHEE Almarie, NP.,have documented all relevant documentation on the behalf of Cynthia KATHEE Almarie, NP.   I, Cynthia KATHEE Almarie, NP, have reviewed all documentation for this visit. The documentation on 10/15/2024 for the exam, diagnosis, procedures, and orders are all accurate and complete.

## 2024-10-16 ENCOUNTER — Encounter: Payer: Self-pay | Admitting: Obstetrics and Gynecology

## 2024-10-16 ENCOUNTER — Ambulatory Visit: Payer: Self-pay | Admitting: Obstetrics and Gynecology

## 2024-10-16 LAB — CBC WITH DIFFERENTIAL/PLATELET
Basophils Absolute: 0 K/uL (ref 0.0–0.1)
Basophils Relative: 0.2 % (ref 0.0–3.0)
Eosinophils Absolute: 0 K/uL (ref 0.0–0.7)
Eosinophils Relative: 0.3 % (ref 0.0–5.0)
HCT: 36.3 % (ref 36.0–46.0)
Hemoglobin: 12.7 g/dL (ref 12.0–15.0)
Lymphocytes Relative: 4.4 % — ABNORMAL LOW (ref 12.0–46.0)
Lymphs Abs: 0.5 K/uL — ABNORMAL LOW (ref 0.7–4.0)
MCHC: 35.1 g/dL (ref 30.0–36.0)
MCV: 95.4 fl (ref 78.0–100.0)
Monocytes Absolute: 0.5 K/uL (ref 0.1–1.0)
Monocytes Relative: 4.4 % (ref 3.0–12.0)
Neutro Abs: 9.8 K/uL — ABNORMAL HIGH (ref 1.4–7.7)
Neutrophils Relative %: 90.7 % — ABNORMAL HIGH (ref 43.0–77.0)
Platelets: 216 K/uL (ref 150.0–400.0)
RBC: 3.8 Mil/uL — ABNORMAL LOW (ref 3.87–5.11)
RDW: 14.9 % (ref 11.5–15.5)
WBC: 10.8 K/uL — ABNORMAL HIGH (ref 4.0–10.5)

## 2024-10-16 LAB — BASIC METABOLIC PANEL WITH GFR
BUN: 24 mg/dL — ABNORMAL HIGH (ref 6–23)
CO2: 28 meq/L (ref 19–32)
Calcium: 8.8 mg/dL (ref 8.4–10.5)
Chloride: 94 meq/L — ABNORMAL LOW (ref 96–112)
Creatinine, Ser: 0.83 mg/dL (ref 0.40–1.20)
GFR: 68.41 mL/min (ref >60.00)
Glucose, Bld: 102 mg/dL — ABNORMAL HIGH (ref 70–99)
Potassium: 4.4 meq/L (ref 3.5–5.1)
Sodium: 132 meq/L — ABNORMAL LOW (ref 135–145)

## 2024-10-17 ENCOUNTER — Ambulatory Visit: Payer: Self-pay | Admitting: Family Medicine

## 2024-10-17 DIAGNOSIS — R7989 Other specified abnormal findings of blood chemistry: Secondary | ICD-10-CM

## 2024-10-17 DIAGNOSIS — E871 Hypo-osmolality and hyponatremia: Secondary | ICD-10-CM

## 2024-10-18 ENCOUNTER — Encounter: Payer: Self-pay | Admitting: Family Medicine

## 2024-10-20 ENCOUNTER — Other Ambulatory Visit (INDEPENDENT_AMBULATORY_CARE_PROVIDER_SITE_OTHER)

## 2024-10-20 DIAGNOSIS — R7989 Other specified abnormal findings of blood chemistry: Secondary | ICD-10-CM

## 2024-10-20 DIAGNOSIS — E871 Hypo-osmolality and hyponatremia: Secondary | ICD-10-CM | POA: Diagnosis not present

## 2024-10-21 ENCOUNTER — Other Ambulatory Visit: Payer: Self-pay | Admitting: Obstetrics and Gynecology

## 2024-10-21 LAB — BASIC METABOLIC PANEL WITH GFR
BUN: 28 mg/dL — ABNORMAL HIGH (ref 6–23)
CO2: 23 meq/L (ref 19–32)
Calcium: 8.9 mg/dL (ref 8.4–10.5)
Chloride: 99 meq/L (ref 96–112)
Creatinine, Ser: 0.78 mg/dL (ref 0.40–1.20)
GFR: 73.7 mL/min
Glucose, Bld: 116 mg/dL — ABNORMAL HIGH (ref 70–99)
Potassium: 4.3 meq/L (ref 3.5–5.1)
Sodium: 134 meq/L — ABNORMAL LOW (ref 135–145)

## 2024-10-21 LAB — CBC WITH DIFFERENTIAL/PLATELET
Basophils Absolute: 0 K/uL (ref 0.0–0.1)
Basophils Relative: 0.2 % (ref 0.0–3.0)
Eosinophils Absolute: 0 K/uL (ref 0.0–0.7)
Eosinophils Relative: 0.3 % (ref 0.0–5.0)
HCT: 36.9 % (ref 36.0–46.0)
Hemoglobin: 12.7 g/dL (ref 12.0–15.0)
Lymphocytes Relative: 5.3 % — ABNORMAL LOW (ref 12.0–46.0)
Lymphs Abs: 0.5 K/uL — ABNORMAL LOW (ref 0.7–4.0)
MCHC: 34.5 g/dL (ref 30.0–36.0)
MCV: 96.5 fl (ref 78.0–100.0)
Monocytes Absolute: 0.2 K/uL (ref 0.1–1.0)
Monocytes Relative: 2.3 % — ABNORMAL LOW (ref 3.0–12.0)
Neutro Abs: 7.9 K/uL — ABNORMAL HIGH (ref 1.4–7.7)
Neutrophils Relative %: 92 % — ABNORMAL HIGH (ref 43.0–77.0)
Platelets: 191 K/uL (ref 150.0–400.0)
RBC: 3.82 Mil/uL — ABNORMAL LOW (ref 3.87–5.11)
RDW: 15.4 % (ref 11.5–15.5)
WBC: 8.6 K/uL (ref 4.0–10.5)

## 2024-10-22 ENCOUNTER — Ambulatory Visit: Payer: Self-pay | Admitting: Family Medicine

## 2024-10-22 DIAGNOSIS — R7989 Other specified abnormal findings of blood chemistry: Secondary | ICD-10-CM

## 2024-10-27 ENCOUNTER — Telehealth: Payer: Self-pay

## 2024-10-27 ENCOUNTER — Other Ambulatory Visit: Payer: Self-pay | Admitting: Obstetrics and Gynecology

## 2024-10-27 MED ORDER — METHENAMINE HIPPURATE 1 G PO TABS: 1.0000 g | ORAL_TABLET | Freq: Two times a day (BID) | ORAL | 0 refills | Status: DC

## 2024-10-27 NOTE — Telephone Encounter (Signed)
 1 month refill sent in for patient

## 2024-10-27 NOTE — Telephone Encounter (Signed)
 Patient called to schedule an appointment for January 15 th @ 2:40 pm. Patient wanted to know if she could get a refill of the medication requested to last her until her appointment date.  Please advise

## 2024-11-03 ENCOUNTER — Other Ambulatory Visit (INDEPENDENT_AMBULATORY_CARE_PROVIDER_SITE_OTHER)

## 2024-11-03 DIAGNOSIS — R7989 Other specified abnormal findings of blood chemistry: Secondary | ICD-10-CM

## 2024-11-04 ENCOUNTER — Ambulatory Visit: Payer: Self-pay | Admitting: Family Medicine

## 2024-11-04 LAB — CBC WITH DIFFERENTIAL/PLATELET
Basophils Absolute: 0 K/uL (ref 0.0–0.1)
Basophils Relative: 0.7 % (ref 0.0–3.0)
Eosinophils Absolute: 0.1 K/uL (ref 0.0–0.7)
Eosinophils Relative: 2.1 % (ref 0.0–5.0)
HCT: 31 % — ABNORMAL LOW (ref 36.0–46.0)
Hemoglobin: 11 g/dL — ABNORMAL LOW (ref 12.0–15.0)
Lymphocytes Relative: 23.6 % (ref 12.0–46.0)
Lymphs Abs: 0.9 K/uL (ref 0.7–4.0)
MCHC: 35.5 g/dL (ref 30.0–36.0)
MCV: 94.4 fl (ref 78.0–100.0)
Monocytes Absolute: 0.5 K/uL (ref 0.1–1.0)
Monocytes Relative: 13.5 % — ABNORMAL HIGH (ref 3.0–12.0)
Neutro Abs: 2.4 K/uL (ref 1.4–7.7)
Neutrophils Relative %: 60.1 % (ref 43.0–77.0)
Platelets: 396 K/uL (ref 150.0–400.0)
RBC: 3.29 Mil/uL — ABNORMAL LOW (ref 3.87–5.11)
RDW: 15.7 % — ABNORMAL HIGH (ref 11.5–15.5)
WBC: 3.9 K/uL — ABNORMAL LOW (ref 4.0–10.5)

## 2024-11-13 ENCOUNTER — Ambulatory Visit: Admitting: Obstetrics and Gynecology

## 2024-11-13 ENCOUNTER — Encounter: Payer: Self-pay | Admitting: Obstetrics and Gynecology

## 2024-11-13 VITALS — BP 106/71 | HR 81

## 2024-11-13 DIAGNOSIS — N390 Urinary tract infection, site not specified: Secondary | ICD-10-CM

## 2024-11-13 DIAGNOSIS — B009 Herpesviral infection, unspecified: Secondary | ICD-10-CM

## 2024-11-13 DIAGNOSIS — Z87440 Personal history of urinary (tract) infections: Secondary | ICD-10-CM | POA: Diagnosis not present

## 2024-11-13 MED ORDER — METHENAMINE HIPPURATE 1 G PO TABS: 1.0000 g | ORAL_TABLET | Freq: Two times a day (BID) | ORAL | 3 refills | Status: AC

## 2024-11-13 MED ORDER — ESTRADIOL 0.01 % VA CREA: 0.5000 g | TOPICAL_CREAM | VAGINAL | 11 refills | Status: DC

## 2024-11-13 MED ORDER — VALACYCLOVIR HCL 500 MG PO TABS: 500.0000 mg | ORAL_TABLET | Freq: Every day | ORAL | 4 refills | Status: AC

## 2024-11-13 NOTE — Progress Notes (Signed)
 [facility] Return Visit  SUBJECTIVE  History of Present Illness: Cynthia Thornton is a 79 y.o. female seen in follow-up for rUTI. Plan at last visit was continue Hiprex  1g x2 daily, use estrogen cream x2 weekly to support vaginal tissue health.   Patient also reports she would like a refill of her HSV prophylaxis.   She does not use her estrogen cream consistently but reports she does use it.   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), HIATAL HERNIA (08/15/2006), History of esophageal stricture, History of external beam radiation therapy, History of hyperthyroidism, Hypercholesteremia (10/03/2010), Insomnia, Irritation of left eye (02/07/2022), Malignant neoplasm of lower-inner quadrant of left breast in female, estrogen receptor positive (HCC) (10/02/2017), Osteopenia (02/2018), Osteoporosis, Pityriasis rosea, PONV (postoperative nausea and vomiting), Primary osteoarthritis of left knee, Rhomboid pain, Smoking history (01/15/2015), Thyrotoxicosis (10/22/2008), Tinnitus (06/27/2019), UNSPECIFIED ANEMIA (11/02/2008), and Wears contact lenses.   Past Surgical History: She  has a past surgical history that includes Video bronchoscopy (Bilateral, 03/16/2015); Breast lumpectomy with radioactive seed and sentinel lymph node biopsy (Left, 11/02/2017); Breast surgery (2000); Vaginal hysterectomy (1991); Tonsillectomy (age 50); Tubal ligation (Bilateral, yrs ago); Laparoscopic  bilateral salpingo oophorectomy (Bilateral, 10/13/2019); and Spinal fusion (12/03/2023).   Medications: She has a current medication list which includes the following prescription(s): acetaminophen , cetirizine, vitamin d , clorazepate , famotidine , gabapentin , methylprednisolone , omeprazole , ondansetron , probiotic product, tramadol , methenamine , and valacyclovir , and the following Facility-Administered Medications: denosumab .   Allergies: Patient is allergic to doxycycline , other, oxycodone , sulfa  antibiotics, codeine, propranolol hcl, and trimethoprim  sulfate [trimethoprim ].   Social History: Patient  reports that she quit smoking about 29 years ago. Her smoking use included cigarettes. She started smoking about 59 years ago. She has a 3 pack-year smoking history. She has never used smokeless tobacco. She reports current alcohol use. She reports that she does not use drugs.     OBJECTIVE     Physical Exam: Vitals:   11/13/24 1431  BP: 106/71  Pulse: 81   Gen: No apparent distress, A&O x 3.  Detailed Urogynecologic Evaluation:     ASSESSMENT AND PLAN    Ms. Skilton is a 79 y.o. with:  1. Recurrent UTI (urinary tract infection)   2. HSV-1 infection    Patient is doing well on her current regimen with only 1 low growth UTI in August and none since. Will continue Hiprex  1g x2 daily and vaginal estrogen cream x2 weekly.  Refill provided on her prophylaxis for HSV.   Patient to return in 1 year or sooner if needed.   Aleia Larocca G Chasitie Passey, NP

## 2025-01-07 ENCOUNTER — Ambulatory Visit: Admitting: Emergency Medicine

## 2025-06-26 ENCOUNTER — Encounter: Admitting: Internal Medicine

## 2025-07-10 ENCOUNTER — Ambulatory Visit
